# Patient Record
Sex: Female | Born: 1955 | Race: White | Hispanic: No | Marital: Married | Smoking: Never smoker
Health system: Southern US, Community
[De-identification: ages and names within clinical notes are randomized; demographics above are authoritative.]

## PROBLEM LIST (undated history)

## (undated) DIAGNOSIS — Z9889 Other specified postprocedural states: Secondary | ICD-10-CM

## (undated) DIAGNOSIS — E039 Hypothyroidism, unspecified: Secondary | ICD-10-CM

## (undated) DIAGNOSIS — C801 Malignant (primary) neoplasm, unspecified: Secondary | ICD-10-CM

## (undated) DIAGNOSIS — M1990 Unspecified osteoarthritis, unspecified site: Secondary | ICD-10-CM

## (undated) DIAGNOSIS — K56609 Unspecified intestinal obstruction, unspecified as to partial versus complete obstruction: Secondary | ICD-10-CM

## (undated) DIAGNOSIS — N189 Chronic kidney disease, unspecified: Secondary | ICD-10-CM

## (undated) DIAGNOSIS — M810 Age-related osteoporosis without current pathological fracture: Secondary | ICD-10-CM

## (undated) DIAGNOSIS — E785 Hyperlipidemia, unspecified: Secondary | ICD-10-CM

## (undated) DIAGNOSIS — K219 Gastro-esophageal reflux disease without esophagitis: Secondary | ICD-10-CM

## (undated) DIAGNOSIS — R112 Nausea with vomiting, unspecified: Secondary | ICD-10-CM

## (undated) DIAGNOSIS — F329 Major depressive disorder, single episode, unspecified: Secondary | ICD-10-CM

## (undated) DIAGNOSIS — F32A Depression, unspecified: Secondary | ICD-10-CM

## (undated) HISTORY — DX: Age-related osteoporosis without current pathological fracture: M81.0

## (undated) HISTORY — PX: WISDOM TOOTH EXTRACTION: SHX21

## (undated) HISTORY — PX: ABDOMINAL SURGERY: SHX537

## (undated) HISTORY — PX: NEPHRECTOMY: SHX65

## (undated) HISTORY — PX: DILATION AND CURETTAGE OF UTERUS: SHX78

## (undated) HISTORY — PX: BLADDER NECK RECONSTRUCTION: SHX1239

## (undated) HISTORY — PX: BREAST SURGERY: SHX581

## (undated) HISTORY — PX: COLOSTOMY: SHX63

## (undated) HISTORY — PX: OTHER SURGICAL HISTORY: SHX169

---

## 1997-07-24 ENCOUNTER — Ambulatory Visit (HOSPITAL_COMMUNITY): Admission: RE | Admit: 1997-07-24 | Discharge: 1997-07-24 | Payer: Self-pay | Admitting: Obstetrics and Gynecology

## 1998-08-17 ENCOUNTER — Other Ambulatory Visit: Admission: RE | Admit: 1998-08-17 | Discharge: 1998-08-17 | Payer: Self-pay | Admitting: Obstetrics and Gynecology

## 1999-09-30 ENCOUNTER — Other Ambulatory Visit: Admission: RE | Admit: 1999-09-30 | Discharge: 1999-09-30 | Payer: Self-pay | Admitting: Obstetrics and Gynecology

## 1999-10-12 ENCOUNTER — Encounter: Admission: RE | Admit: 1999-10-12 | Discharge: 2000-01-10 | Payer: Self-pay | Admitting: Obstetrics & Gynecology

## 2000-12-22 ENCOUNTER — Other Ambulatory Visit: Admission: RE | Admit: 2000-12-22 | Discharge: 2000-12-22 | Payer: Self-pay | Admitting: Obstetrics and Gynecology

## 2001-01-05 ENCOUNTER — Encounter: Payer: Self-pay | Admitting: Obstetrics and Gynecology

## 2001-01-05 ENCOUNTER — Encounter: Admission: RE | Admit: 2001-01-05 | Discharge: 2001-01-05 | Payer: Self-pay | Admitting: Obstetrics and Gynecology

## 2001-12-31 ENCOUNTER — Other Ambulatory Visit: Admission: RE | Admit: 2001-12-31 | Discharge: 2001-12-31 | Payer: Self-pay | Admitting: Obstetrics and Gynecology

## 2002-10-15 ENCOUNTER — Encounter (INDEPENDENT_AMBULATORY_CARE_PROVIDER_SITE_OTHER): Payer: Self-pay | Admitting: Specialist

## 2002-10-15 ENCOUNTER — Ambulatory Visit (HOSPITAL_BASED_OUTPATIENT_CLINIC_OR_DEPARTMENT_OTHER): Admission: RE | Admit: 2002-10-15 | Discharge: 2002-10-15 | Payer: Self-pay | Admitting: Urology

## 2003-02-11 ENCOUNTER — Other Ambulatory Visit: Admission: RE | Admit: 2003-02-11 | Discharge: 2003-02-11 | Payer: Self-pay | Admitting: Obstetrics and Gynecology

## 2004-03-15 ENCOUNTER — Other Ambulatory Visit: Admission: RE | Admit: 2004-03-15 | Discharge: 2004-03-15 | Payer: Self-pay | Admitting: Obstetrics and Gynecology

## 2005-03-22 ENCOUNTER — Other Ambulatory Visit: Admission: RE | Admit: 2005-03-22 | Discharge: 2005-03-22 | Payer: Self-pay | Admitting: Obstetrics and Gynecology

## 2006-06-13 HISTORY — PX: OTHER SURGICAL HISTORY: SHX169

## 2007-01-25 ENCOUNTER — Ambulatory Visit (HOSPITAL_BASED_OUTPATIENT_CLINIC_OR_DEPARTMENT_OTHER): Admission: RE | Admit: 2007-01-25 | Discharge: 2007-01-25 | Payer: Self-pay | Admitting: Urology

## 2007-01-29 ENCOUNTER — Ambulatory Visit: Payer: Self-pay | Admitting: Surgery

## 2007-05-18 ENCOUNTER — Encounter: Admission: RE | Admit: 2007-05-18 | Discharge: 2007-05-18 | Payer: Self-pay | Admitting: Obstetrics and Gynecology

## 2008-06-02 ENCOUNTER — Ambulatory Visit (HOSPITAL_COMMUNITY): Admission: RE | Admit: 2008-06-02 | Discharge: 2008-06-02 | Payer: Self-pay | Admitting: Internal Medicine

## 2008-07-04 ENCOUNTER — Encounter: Admission: RE | Admit: 2008-07-04 | Discharge: 2008-07-04 | Payer: Self-pay | Admitting: Obstetrics and Gynecology

## 2008-11-07 ENCOUNTER — Encounter: Admission: RE | Admit: 2008-11-07 | Discharge: 2008-11-07 | Payer: Self-pay | Admitting: Obstetrics and Gynecology

## 2009-11-10 ENCOUNTER — Encounter: Admission: RE | Admit: 2009-11-10 | Discharge: 2009-11-10 | Payer: Self-pay | Admitting: Obstetrics and Gynecology

## 2009-11-20 ENCOUNTER — Encounter: Admission: RE | Admit: 2009-11-20 | Discharge: 2009-11-20 | Payer: Self-pay | Admitting: Obstetrics and Gynecology

## 2009-11-24 ENCOUNTER — Encounter: Admission: RE | Admit: 2009-11-24 | Discharge: 2009-11-24 | Payer: Self-pay | Admitting: Obstetrics and Gynecology

## 2009-11-26 ENCOUNTER — Encounter: Admission: RE | Admit: 2009-11-26 | Discharge: 2009-11-26 | Payer: Self-pay | Admitting: Obstetrics and Gynecology

## 2009-11-26 ENCOUNTER — Ambulatory Visit: Payer: Self-pay | Admitting: Oncology

## 2009-12-02 LAB — COMPREHENSIVE METABOLIC PANEL
AST: 35 U/L (ref 0–37)
Albumin: 4.1 g/dL (ref 3.5–5.2)
Calcium: 9.9 mg/dL (ref 8.4–10.5)
Chloride: 102 mEq/L (ref 96–112)
Glucose, Bld: 90 mg/dL (ref 70–99)
Potassium: 4 mEq/L (ref 3.5–5.3)
Sodium: 140 mEq/L (ref 135–145)
Total Bilirubin: 0.9 mg/dL (ref 0.3–1.2)
Total Protein: 7.3 g/dL (ref 6.0–8.3)

## 2009-12-02 LAB — CBC WITH DIFFERENTIAL/PLATELET
BASO%: 0.2 % (ref 0.0–2.0)
Basophils Absolute: 0 10*3/uL (ref 0.0–0.1)
EOS%: 2 % (ref 0.0–7.0)
HCT: 38.9 % (ref 34.8–46.6)
HGB: 13.5 g/dL (ref 11.6–15.9)
MCH: 32.7 pg (ref 25.1–34.0)
MONO%: 6.1 % (ref 0.0–14.0)
NEUT#: 5.1 10*3/uL (ref 1.5–6.5)
Platelets: 302 10*3/uL (ref 145–400)
RBC: 4.14 10*6/uL (ref 3.70–5.45)
WBC: 7.6 10*3/uL (ref 3.9–10.3)

## 2009-12-02 LAB — CANCER ANTIGEN 27.29: CA 27.29: 118 U/mL — ABNORMAL HIGH (ref 0–39)

## 2009-12-15 ENCOUNTER — Encounter (INDEPENDENT_AMBULATORY_CARE_PROVIDER_SITE_OTHER): Payer: Self-pay | Admitting: Surgery

## 2009-12-15 ENCOUNTER — Ambulatory Visit (HOSPITAL_COMMUNITY): Admission: RE | Admit: 2009-12-15 | Discharge: 2009-12-16 | Payer: Self-pay | Admitting: Surgery

## 2009-12-30 ENCOUNTER — Ambulatory Visit (HOSPITAL_COMMUNITY): Admission: RE | Admit: 2009-12-30 | Discharge: 2009-12-30 | Payer: Self-pay | Admitting: Oncology

## 2010-01-04 ENCOUNTER — Ambulatory Visit: Payer: Self-pay | Admitting: Oncology

## 2010-01-22 ENCOUNTER — Encounter (HOSPITAL_COMMUNITY): Admission: RE | Admit: 2010-01-22 | Discharge: 2010-03-11 | Payer: Self-pay | Admitting: Oncology

## 2010-01-28 ENCOUNTER — Encounter
Admission: RE | Admit: 2010-01-28 | Discharge: 2010-03-30 | Payer: Self-pay | Source: Home / Self Care | Admitting: Oncology

## 2010-02-04 ENCOUNTER — Ambulatory Visit: Payer: Self-pay | Admitting: Oncology

## 2010-02-10 LAB — CBC WITH DIFFERENTIAL/PLATELET
Basophils Absolute: 0 10*3/uL (ref 0.0–0.1)
MONO#: 0.4 10*3/uL (ref 0.1–0.9)
NEUT%: 71.2 % (ref 38.4–76.8)
RBC: 4.07 10*6/uL (ref 3.70–5.45)
RDW: 12.3 % (ref 11.2–14.5)
WBC: 6.6 10*3/uL (ref 3.9–10.3)
lymph#: 1.4 10*3/uL (ref 0.9–3.3)

## 2010-02-10 LAB — COMPREHENSIVE METABOLIC PANEL
AST: 23 U/L (ref 0–37)
Alkaline Phosphatase: 50 U/L (ref 39–117)
BUN: 11 mg/dL (ref 6–23)
CO2: 28 mEq/L (ref 19–32)
Creatinine, Ser: 0.98 mg/dL (ref 0.40–1.20)
Glucose, Bld: 86 mg/dL (ref 70–99)
Potassium: 3.9 mEq/L (ref 3.5–5.3)
Sodium: 139 mEq/L (ref 135–145)
Total Bilirubin: 0.7 mg/dL (ref 0.3–1.2)

## 2010-02-18 ENCOUNTER — Ambulatory Visit: Payer: Self-pay | Admitting: Genetic Counselor

## 2010-04-30 ENCOUNTER — Ambulatory Visit: Payer: Self-pay | Admitting: Genetic Counselor

## 2010-05-04 ENCOUNTER — Ambulatory Visit: Payer: Self-pay | Admitting: Oncology

## 2010-05-04 LAB — COMPREHENSIVE METABOLIC PANEL
ALT: 18 U/L (ref 0–35)
Albumin: 4.2 g/dL (ref 3.5–5.2)
Alkaline Phosphatase: 64 U/L (ref 39–117)
Calcium: 9.4 mg/dL (ref 8.4–10.5)
Creatinine, Ser: 0.94 mg/dL (ref 0.40–1.20)
Glucose, Bld: 82 mg/dL (ref 70–99)
Total Bilirubin: 0.5 mg/dL (ref 0.3–1.2)
Total Protein: 6.6 g/dL (ref 6.0–8.3)

## 2010-05-04 LAB — CANCER ANTIGEN 27.29: CA 27.29: 56 U/mL — ABNORMAL HIGH (ref 0–39)

## 2010-05-04 LAB — CBC WITH DIFFERENTIAL/PLATELET
Basophils Absolute: 0 10*3/uL (ref 0.0–0.1)
LYMPH%: 17.7 % (ref 14.0–49.7)
MONO#: 0.4 10*3/uL (ref 0.1–0.9)
MONO%: 5.7 % (ref 0.0–14.0)
Platelets: 233 10*3/uL (ref 145–400)
RDW: 12.4 % (ref 11.2–14.5)
lymph#: 1.1 10*3/uL (ref 0.9–3.3)

## 2010-05-04 LAB — FOLLICLE STIMULATING HORMONE: FSH: 102.4 m[IU]/mL

## 2010-06-21 ENCOUNTER — Ambulatory Visit: Payer: Self-pay | Admitting: Oncology

## 2010-06-24 LAB — CBC WITH DIFFERENTIAL/PLATELET
BASO%: 0.2 % (ref 0.0–2.0)
Basophils Absolute: 0 10*3/uL (ref 0.0–0.1)
EOS%: 0.9 % (ref 0.0–7.0)
Eosinophils Absolute: 0.1 10*3/uL (ref 0.0–0.5)
HCT: 37.8 % (ref 34.8–46.6)
HGB: 13.1 g/dL (ref 11.6–15.9)
LYMPH%: 25 % (ref 14.0–49.7)
MCH: 32.2 pg (ref 25.1–34.0)
MCHC: 34.7 g/dL (ref 31.5–36.0)
MCV: 92.7 fL (ref 79.5–101.0)
MONO#: 0.3 10*3/uL (ref 0.1–0.9)
MONO%: 4.9 % (ref 0.0–14.0)
NEUT#: 4.9 10*3/uL (ref 1.5–6.5)
NEUT%: 69 % (ref 38.4–76.8)
Platelets: 273 10*3/uL (ref 145–400)
RBC: 4.08 10*6/uL (ref 3.70–5.45)
RDW: 12.7 % (ref 11.2–14.5)
WBC: 7.1 10*3/uL (ref 3.9–10.3)
lymph#: 1.8 10*3/uL (ref 0.9–3.3)

## 2010-06-24 LAB — COMPREHENSIVE METABOLIC PANEL
ALT: 19 U/L (ref 0–35)
AST: 23 U/L (ref 0–37)
Albumin: 4.4 g/dL (ref 3.5–5.2)
Alkaline Phosphatase: 57 U/L (ref 39–117)
BUN: 13 mg/dL (ref 6–23)
CO2: 28 mEq/L (ref 19–32)
Calcium: 9.8 mg/dL (ref 8.4–10.5)
Chloride: 104 mEq/L (ref 96–112)
Creatinine, Ser: 0.9 mg/dL (ref 0.40–1.20)
Glucose, Bld: 86 mg/dL (ref 70–99)
Potassium: 4.2 mEq/L (ref 3.5–5.3)
Sodium: 140 mEq/L (ref 135–145)
Total Bilirubin: 0.5 mg/dL (ref 0.3–1.2)
Total Protein: 7 g/dL (ref 6.0–8.3)

## 2010-06-24 LAB — CANCER ANTIGEN 27.29: CA 27.29: 57 U/mL — ABNORMAL HIGH (ref 0–39)

## 2010-07-02 ENCOUNTER — Other Ambulatory Visit: Payer: Self-pay | Admitting: Oncology

## 2010-07-02 DIAGNOSIS — C50919 Malignant neoplasm of unspecified site of unspecified female breast: Secondary | ICD-10-CM

## 2010-07-04 ENCOUNTER — Encounter: Payer: Self-pay | Admitting: Oncology

## 2010-07-04 ENCOUNTER — Encounter: Payer: Self-pay | Admitting: Obstetrics and Gynecology

## 2010-07-30 ENCOUNTER — Encounter (HOSPITAL_COMMUNITY): Payer: Self-pay

## 2010-07-30 ENCOUNTER — Ambulatory Visit (HOSPITAL_COMMUNITY)
Admission: RE | Admit: 2010-07-30 | Discharge: 2010-07-30 | Disposition: A | Discharge status: Home / Self Care | Payer: 59 | Source: Ambulatory Visit | Attending: Oncology | Admitting: Oncology

## 2010-07-30 DIAGNOSIS — Z9010 Acquired absence of unspecified breast and nipple: Secondary | ICD-10-CM | POA: Insufficient documentation

## 2010-07-30 DIAGNOSIS — C7951 Secondary malignant neoplasm of bone: Secondary | ICD-10-CM | POA: Insufficient documentation

## 2010-07-30 DIAGNOSIS — C7952 Secondary malignant neoplasm of bone marrow: Secondary | ICD-10-CM | POA: Insufficient documentation

## 2010-07-30 DIAGNOSIS — C50919 Malignant neoplasm of unspecified site of unspecified female breast: Secondary | ICD-10-CM | POA: Insufficient documentation

## 2010-07-30 DIAGNOSIS — Z09 Encounter for follow-up examination after completed treatment for conditions other than malignant neoplasm: Secondary | ICD-10-CM | POA: Insufficient documentation

## 2010-07-30 DIAGNOSIS — Z905 Acquired absence of kidney: Secondary | ICD-10-CM | POA: Insufficient documentation

## 2010-07-30 HISTORY — DX: Malignant (primary) neoplasm, unspecified: C80.1

## 2010-07-30 MED ORDER — IOHEXOL 300 MG/ML  SOLN
80.0000 mL | Freq: Once | INTRAMUSCULAR | Status: AC | PRN
  Administered 2010-07-30: 80 mL via INTRAVENOUS

## 2010-08-11 ENCOUNTER — Encounter (HOSPITAL_BASED_OUTPATIENT_CLINIC_OR_DEPARTMENT_OTHER): Payer: 59 | Admitting: Oncology

## 2010-08-11 ENCOUNTER — Other Ambulatory Visit: Payer: Self-pay | Admitting: Oncology

## 2010-08-11 DIAGNOSIS — C50319 Malignant neoplasm of lower-inner quadrant of unspecified female breast: Secondary | ICD-10-CM

## 2010-08-11 DIAGNOSIS — M949 Disorder of cartilage, unspecified: Secondary | ICD-10-CM

## 2010-08-11 DIAGNOSIS — C50919 Malignant neoplasm of unspecified site of unspecified female breast: Secondary | ICD-10-CM

## 2010-08-11 DIAGNOSIS — Z170 Estrogen receptor positive status [ER+]: Secondary | ICD-10-CM

## 2010-08-28 LAB — GLUCOSE, CAPILLARY: Glucose-Capillary: 83 mg/dL (ref 70–99)

## 2010-08-29 LAB — DIFFERENTIAL
Basophils Absolute: 0 10*3/uL (ref 0.0–0.1)
Eosinophils Absolute: 0.1 10*3/uL (ref 0.0–0.7)
Eosinophils Relative: 2 % (ref 0–5)
Monocytes Absolute: 0.5 10*3/uL (ref 0.1–1.0)

## 2010-08-29 LAB — BASIC METABOLIC PANEL
BUN: 12 mg/dL (ref 6–23)
Chloride: 105 mEq/L (ref 96–112)
Potassium: 4.2 mEq/L (ref 3.5–5.1)
Sodium: 137 mEq/L (ref 135–145)

## 2010-08-29 LAB — COMPREHENSIVE METABOLIC PANEL
ALT: 28 U/L (ref 0–35)
AST: 25 U/L (ref 0–37)
Alkaline Phosphatase: 62 U/L (ref 39–117)
CO2: 30 mEq/L (ref 19–32)
Chloride: 103 mEq/L (ref 96–112)
GFR calc Af Amer: >60 mL/min (ref >60)
GFR calc non Af Amer: >60 mL/min (ref >60)
Potassium: 3.9 mEq/L (ref 3.5–5.1)
Sodium: 139 mEq/L (ref 135–145)
Total Bilirubin: 0.7 mg/dL (ref 0.3–1.2)

## 2010-08-29 LAB — CBC
HCT: 31.4 % — ABNORMAL LOW (ref 36.0–46.0)
Hemoglobin: 10.8 g/dL — ABNORMAL LOW (ref 12.0–15.0)
Hemoglobin: 13.3 g/dL (ref 12.0–15.0)
MCH: 33.3 pg (ref 26.0–34.0)
MCV: 96.3 fL (ref 78.0–100.0)
RBC: 3.98 MIL/uL (ref 3.87–5.11)
RDW: 12.9 % (ref 11.5–15.5)
WBC: 11.9 10*3/uL — ABNORMAL HIGH (ref 4.0–10.5)
WBC: 8.2 10*3/uL (ref 4.0–10.5)

## 2010-08-29 LAB — LACTATE DEHYDROGENASE: LDH: 142 U/L (ref 94–250)

## 2010-09-07 ENCOUNTER — Other Ambulatory Visit: Payer: Self-pay | Admitting: Surgery

## 2010-09-07 DIAGNOSIS — Z9010 Acquired absence of unspecified breast and nipple: Secondary | ICD-10-CM

## 2010-10-01 ENCOUNTER — Other Ambulatory Visit: Payer: Self-pay | Admitting: Oncology

## 2010-10-01 DIAGNOSIS — C799 Secondary malignant neoplasm of unspecified site: Secondary | ICD-10-CM

## 2010-10-01 DIAGNOSIS — Z853 Personal history of malignant neoplasm of breast: Secondary | ICD-10-CM

## 2010-10-06 ENCOUNTER — Ambulatory Visit (HOSPITAL_COMMUNITY)
Admission: RE | Admit: 2010-10-06 | Discharge: 2010-10-06 | Disposition: A | Discharge status: Home / Self Care | Payer: 59 | Source: Ambulatory Visit | Attending: Oncology | Admitting: Oncology

## 2010-10-06 DIAGNOSIS — Z853 Personal history of malignant neoplasm of breast: Secondary | ICD-10-CM

## 2010-10-06 DIAGNOSIS — R51 Headache: Secondary | ICD-10-CM | POA: Insufficient documentation

## 2010-10-06 DIAGNOSIS — C799 Secondary malignant neoplasm of unspecified site: Secondary | ICD-10-CM

## 2010-10-06 DIAGNOSIS — M47812 Spondylosis without myelopathy or radiculopathy, cervical region: Secondary | ICD-10-CM | POA: Insufficient documentation

## 2010-10-06 DIAGNOSIS — M542 Cervicalgia: Secondary | ICD-10-CM | POA: Insufficient documentation

## 2010-10-06 DIAGNOSIS — H9319 Tinnitus, unspecified ear: Secondary | ICD-10-CM | POA: Insufficient documentation

## 2010-10-06 DIAGNOSIS — C50919 Malignant neoplasm of unspecified site of unspecified female breast: Secondary | ICD-10-CM | POA: Insufficient documentation

## 2010-10-06 DIAGNOSIS — R42 Dizziness and giddiness: Secondary | ICD-10-CM | POA: Insufficient documentation

## 2010-10-06 LAB — CREATININE, SERUM: Creatinine, Ser: 1.04 mg/dL (ref 0.4–1.2)

## 2010-10-06 MED ORDER — GADOBENATE DIMEGLUMINE 529 MG/ML IV SOLN
10.0000 mL | Freq: Once | INTRAVENOUS | Status: AC | PRN
  Administered 2010-10-06: 10 mL via INTRAVENOUS

## 2010-10-07 ENCOUNTER — Other Ambulatory Visit (HOSPITAL_COMMUNITY): Payer: 59

## 2010-10-11 ENCOUNTER — Encounter (HOSPITAL_BASED_OUTPATIENT_CLINIC_OR_DEPARTMENT_OTHER): Payer: 59 | Admitting: Oncology

## 2010-10-11 ENCOUNTER — Other Ambulatory Visit: Payer: Self-pay | Admitting: Oncology

## 2010-10-11 DIAGNOSIS — Z170 Estrogen receptor positive status [ER+]: Secondary | ICD-10-CM

## 2010-10-11 DIAGNOSIS — C50319 Malignant neoplasm of lower-inner quadrant of unspecified female breast: Secondary | ICD-10-CM

## 2010-10-11 DIAGNOSIS — M899 Disorder of bone, unspecified: Secondary | ICD-10-CM

## 2010-10-11 LAB — CANCER ANTIGEN 27.29: CA 27.29: 48 U/mL — ABNORMAL HIGH (ref 0–39)

## 2010-10-14 ENCOUNTER — Other Ambulatory Visit: Payer: Self-pay | Admitting: Neurology

## 2010-10-14 DIAGNOSIS — R0989 Other specified symptoms and signs involving the circulatory and respiratory systems: Secondary | ICD-10-CM

## 2010-10-15 ENCOUNTER — Ambulatory Visit
Admission: RE | Admit: 2010-10-15 | Discharge: 2010-10-15 | Disposition: A | Discharge status: Home / Self Care | Payer: 59 | Source: Ambulatory Visit | Attending: Neurology | Admitting: Neurology

## 2010-10-15 DIAGNOSIS — R0989 Other specified symptoms and signs involving the circulatory and respiratory systems: Secondary | ICD-10-CM

## 2010-10-18 ENCOUNTER — Encounter (HOSPITAL_BASED_OUTPATIENT_CLINIC_OR_DEPARTMENT_OTHER): Payer: 59

## 2010-10-18 DIAGNOSIS — M899 Disorder of bone, unspecified: Secondary | ICD-10-CM

## 2010-10-18 DIAGNOSIS — Z170 Estrogen receptor positive status [ER+]: Secondary | ICD-10-CM

## 2010-10-18 DIAGNOSIS — C50319 Malignant neoplasm of lower-inner quadrant of unspecified female breast: Secondary | ICD-10-CM

## 2010-10-26 NOTE — Procedures (Signed)
DUPLEX DEEP VENOUS EXAM - LOWER EXTREMITY   INDICATION:  Right leg pain.   HISTORY:  Edema:  No  Trauma/Surgery:  Yes  Pain:  Yes  PE:  No  Previous DVT:  No  Anticoagulants:  Other:   DUPLEX EXAM:                CFV   SFV   PopV  PTV    GSV                R  L  R  L  R  L  R   L  R  L  Thrombosis    0  0  0     0     0      0  Spontaneous   +  +  +     +     +      +  Phasic        +  +  +     +     +      +  Augmentation  +  +  +     +     +      +  Compressible  +  +  +     +     +      +  Competent     +  +  +     +     +      +   Legend:  + - yes  o - no  p - partial  D - decreased   IMPRESSION:  No evidence of deep venous thrombosis noted in the right  leg.   _____________________________  Janetta Hora. Fields, MD   MG/MEDQ  D:  01/29/2007  T:  01/30/2007  Job:  [id number]

## 2010-10-26 NOTE — Op Note (Signed)
NAMEKENNIDY, LAMKE                ACCOUNT NO.:  [id number]   MEDICAL RECORD NO.:  [id number]          PATIENT TYPE:  AMB   LOCATION:  [facility]                         FACILITY:  [facility]   PHYSICIAN:  Martina Sinner, MD DATE OF BIRTH:  1956-02-02   DATE OF PROCEDURE:  01/25/2007  DATE OF DISCHARGE:                               OPERATIVE REPORT   PREOPERATIVE DIAGNOSIS:  Stress incontinence.   POSTOPERATIVE DIAGNOSIS:  Stress incontinence.   PROCEDURE PERFORMED:  Sling, cystourethropexy [facility]), plus cystoscopy.   INDICATIONS FOR PROCEDURE:  The patient has mixed stress urge  incontinence, not responding to medical and behavioral therapy.  She has  a single right kidney.  She has had an anti-reflux procedure done on the  right side.   DESCRIPTION OF PROCEDURE:  The patient was prepped and draped in the  usual fashion.  Extra care was taken with leg positioning to minimize  the risk of  compartment syndrome, neuropathy, and deep vein thrombosis.  Preoperative laboratory tests were normal.  Preoperative antibiotics  were given.   Two 1 cm incisions were made 1.5 cm lateral to the midline one  fingerbreadth above the symphysis pubis.  I made a 2 cm incision after  instilling 7 cc of 1% lidocaine with epinephrine, allowing me to raise a  thick pubocervical fascial flap.  I dissected sharply to the  urethrovesical angle bilaterally.   With the bladder emptied, I passed the [facility] needle on the back and then  along the back of the symphysis pubis parallel to the midline, bringing  the needle out through the urethrovesical angle bilaterally onto the  pulp of my left index finger.   I scoped the patient.  There was no entry into the bladder.  There was  no injury to the urethra or bladder.  There was efflux of indigo carmine  from the right ureter.  The ureteral orifices looked basically in normal  position and normal size and shape.  There was no deflection of the  bladder when I  wiggled the trocars.   With the bladder empty I attached the [facility] sling and brought it up  through the retropubic space with the sheath in place.   I positioned the sling over the mid urethra and tensioned it over the  fat part of a medium size Kelly.  I cut below the blue dots and  irrigated the sheath and then removed the sheath.  I was very happy with  the position of the sling.  It laid nicely over the mid urethra.  It was  hypermobile in the midline and lateral to the midline.   The vaginal wall incision was closed with #2-0 Vicryl running suture,  followed by two interrupted sutures.  The skin was closed with #4-0  subcuticular and Dermabond.   Blood loss was less than 30 mL.   The catheter was draining well, draining blue urine at the end of the  case.  Vaginal pack with Estrace cream was inserted.  Catheter was  plugged and she will of a trial of voiding postoperatively.   I am  hoping this procedure will reach her treatment goal.           ______________________________  Martina Sinner, MD  Electronically Signed     SAM/MEDQ  D:  01/25/2007  T:  01/26/2007  Job:  [id number]

## 2010-10-29 NOTE — Op Note (Signed)
   NAME:  Sonia Hill, Sonia Hill                          ACCOUNT NO.:  [id number]   MEDICAL RECORD NO.:  [id number]                   PATIENT TYPE:  AMB   LOCATION:  [facility]                                 FACILITY:  [facility]   PHYSICIAN:  [place] Service, M.D.             DATE OF BIRTH:  1955/12/02   DATE OF PROCEDURE:  DATE OF DISCHARGE:                                 OPERATIVE REPORT   PREOPERATIVE DIAGNOSES:  Stress incontinence, history of left nephrectomy  and right reimplant, recent discovery of pigmented lesion within the  bladder.   POSTOPERATIVE DIAGNOSES:  Stress incontinence, history of left nephrectomy  and right reimplant, recent discovery of pigmented lesion within the  bladder.   ANESTHESIA:  General.   DRAINS:  None.   COMPLICATIONS:  None.   PROCEDURE:  Cystoscopy, cystogram, retrogrades and bladder biopsy.   DESCRIPTION OF PROCEDURE:  The patient was prepped and draped in the dorsal  lithotomy position after institution of an adequate level of general  anesthesia. A 16 French red rubber catheter was inserted, cystogram films  were obtained at 100, 200, 300 and 500 mL, no evidence of reflux. The  bladder was drained, retrograde films were obtained, normal course and  caliber of the ureter, pelvis and calices, no evidence of dilation or other  anatomic deformity. Biopsy forceps were then inserted, photographs were  obtained of pigmented lesion in the right bladder wall, biopsied without  difficulty. Adequate samples were sent to pathology, bleeding sites were  lightly cauterized with a Bugbee electrode. The bladder was drained,  cystoscope was removed, patient was given a B&O suppository and returned to  recovery in satisfactory condition.                                               Boston Service, M.D.    RH/MEDQ  D:  10/15/2002  T:  10/15/2002  Job:  [id number]

## 2010-11-16 ENCOUNTER — Ambulatory Visit: Payer: 59

## 2010-12-24 ENCOUNTER — Ambulatory Visit
Admission: RE | Admit: 2010-12-24 | Discharge: 2010-12-24 | Disposition: A | Discharge status: Home / Self Care | Payer: 59 | Source: Ambulatory Visit | Attending: Surgery | Admitting: Surgery

## 2010-12-24 ENCOUNTER — Other Ambulatory Visit (HOSPITAL_COMMUNITY): Payer: 59

## 2010-12-24 DIAGNOSIS — Z9010 Acquired absence of unspecified breast and nipple: Secondary | ICD-10-CM

## 2010-12-29 ENCOUNTER — Ambulatory Visit (HOSPITAL_COMMUNITY): Admission: RE | Admit: 2010-12-29 | Payer: 59 | Source: Ambulatory Visit | Admitting: Obstetrics and Gynecology

## 2010-12-29 ENCOUNTER — Encounter (HOSPITAL_COMMUNITY): Admission: RE | Payer: Self-pay | Source: Ambulatory Visit

## 2010-12-29 SURGERY — HYSTERECTOMY, VAGINAL, LAPAROSCOPY-ASSISTED
Anesthesia: General

## 2010-12-30 ENCOUNTER — Inpatient Hospital Stay (HOSPITAL_COMMUNITY): Admission: RE | Admit: 2010-12-30 | Payer: 59 | Source: Ambulatory Visit

## 2010-12-30 ENCOUNTER — Other Ambulatory Visit (HOSPITAL_COMMUNITY): Payer: 59

## 2010-12-31 ENCOUNTER — Other Ambulatory Visit: Payer: Self-pay | Admitting: Oncology

## 2010-12-31 ENCOUNTER — Encounter (HOSPITAL_BASED_OUTPATIENT_CLINIC_OR_DEPARTMENT_OTHER): Payer: 59 | Admitting: Oncology

## 2010-12-31 DIAGNOSIS — C50319 Malignant neoplasm of lower-inner quadrant of unspecified female breast: Secondary | ICD-10-CM

## 2010-12-31 DIAGNOSIS — Z170 Estrogen receptor positive status [ER+]: Secondary | ICD-10-CM

## 2010-12-31 DIAGNOSIS — M899 Disorder of bone, unspecified: Secondary | ICD-10-CM

## 2010-12-31 LAB — COMPREHENSIVE METABOLIC PANEL
AST: 45 U/L — ABNORMAL HIGH (ref 0–37)
Albumin: 3.9 g/dL (ref 3.5–5.2)
Alkaline Phosphatase: 56 U/L (ref 39–117)
Glucose, Bld: 82 mg/dL (ref 70–99)
Potassium: 4.1 mEq/L (ref 3.5–5.3)
Sodium: 141 mEq/L (ref 135–145)
Total Bilirubin: 0.7 mg/dL (ref 0.3–1.2)
Total Protein: 6.4 g/dL (ref 6.0–8.3)

## 2010-12-31 LAB — CBC WITH DIFFERENTIAL/PLATELET
BASO%: 0.1 % (ref 0.0–2.0)
Eosinophils Absolute: 0.1 10*3/uL (ref 0.0–0.5)
HCT: 36.8 % (ref 34.8–46.6)
LYMPH%: 17.8 % (ref 14.0–49.7)
MONO#: 0.3 10*3/uL (ref 0.1–0.9)
NEUT#: 5.3 10*3/uL (ref 1.5–6.5)
Platelets: 237 10*3/uL (ref 145–400)
RBC: 4.05 10*6/uL (ref 3.70–5.45)
WBC: 7 10*3/uL (ref 3.9–10.3)
lymph#: 1.2 10*3/uL (ref 0.9–3.3)

## 2011-01-06 ENCOUNTER — Encounter: Payer: 59 | Admitting: Oncology

## 2011-01-06 DIAGNOSIS — R5381 Other malaise: Secondary | ICD-10-CM

## 2011-01-10 ENCOUNTER — Other Ambulatory Visit: Payer: Self-pay | Admitting: Oncology

## 2011-01-11 ENCOUNTER — Other Ambulatory Visit: Payer: Self-pay | Admitting: Oncology

## 2011-01-11 DIAGNOSIS — C50919 Malignant neoplasm of unspecified site of unspecified female breast: Secondary | ICD-10-CM

## 2011-01-13 ENCOUNTER — Other Ambulatory Visit (HOSPITAL_COMMUNITY): Payer: 59

## 2011-01-14 ENCOUNTER — Encounter (HOSPITAL_BASED_OUTPATIENT_CLINIC_OR_DEPARTMENT_OTHER): Payer: 59 | Admitting: Oncology

## 2011-01-14 ENCOUNTER — Ambulatory Visit (HOSPITAL_COMMUNITY)
Admission: RE | Admit: 2011-01-14 | Discharge: 2011-01-14 | Disposition: A | Discharge status: Home / Self Care | Payer: 59 | Source: Ambulatory Visit | Attending: Oncology | Admitting: Oncology

## 2011-01-14 ENCOUNTER — Other Ambulatory Visit: Payer: Self-pay | Admitting: Oncology

## 2011-01-14 DIAGNOSIS — C50919 Malignant neoplasm of unspecified site of unspecified female breast: Secondary | ICD-10-CM

## 2011-01-14 LAB — HEPATIC FUNCTION PANEL
ALT: 53 U/L — ABNORMAL HIGH (ref 0–35)
AST: 43 U/L — ABNORMAL HIGH (ref 0–37)
Albumin: 3.8 g/dL (ref 3.5–5.2)
Bilirubin, Direct: 0.1 mg/dL (ref 0.0–0.3)
Total Bilirubin: 0.7 mg/dL (ref 0.3–1.2)

## 2011-01-14 LAB — FOLLICLE STIMULATING HORMONE: FSH: 46.8 m[IU]/mL

## 2011-01-14 MED ORDER — GADOBENATE DIMEGLUMINE 529 MG/ML IV SOLN
10.0000 mL | Freq: Once | INTRAVENOUS | Status: AC | PRN
  Administered 2011-01-14: 10 mL via INTRAVENOUS

## 2011-01-19 ENCOUNTER — Other Ambulatory Visit: Payer: Self-pay | Admitting: Oncology

## 2011-01-19 DIAGNOSIS — C50919 Malignant neoplasm of unspecified site of unspecified female breast: Secondary | ICD-10-CM

## 2011-01-19 LAB — ESTRADIOL, ULTRA SENS: Estradiol, Ultra Sensitive: 75 pg/mL

## 2011-01-27 ENCOUNTER — Other Ambulatory Visit (HOSPITAL_COMMUNITY): Payer: 59

## 2011-01-27 ENCOUNTER — Ambulatory Visit
Admission: RE | Admit: 2011-01-27 | Discharge: 2011-01-27 | Disposition: A | Discharge status: Home / Self Care | Payer: 59 | Source: Ambulatory Visit | Attending: Oncology | Admitting: Oncology

## 2011-01-27 DIAGNOSIS — C50919 Malignant neoplasm of unspecified site of unspecified female breast: Secondary | ICD-10-CM

## 2011-01-27 MED ORDER — GADOBENATE DIMEGLUMINE 529 MG/ML IV SOLN
10.0000 mL | Freq: Once | INTRAVENOUS | Status: AC | PRN
  Administered 2011-01-27: 10 mL via INTRAVENOUS

## 2011-01-28 ENCOUNTER — Other Ambulatory Visit: Payer: Self-pay | Admitting: Oncology

## 2011-01-28 ENCOUNTER — Encounter (HOSPITAL_BASED_OUTPATIENT_CLINIC_OR_DEPARTMENT_OTHER): Payer: 59 | Admitting: Oncology

## 2011-01-28 DIAGNOSIS — M949 Disorder of cartilage, unspecified: Secondary | ICD-10-CM

## 2011-01-28 DIAGNOSIS — Z170 Estrogen receptor positive status [ER+]: Secondary | ICD-10-CM

## 2011-01-28 DIAGNOSIS — C50319 Malignant neoplasm of lower-inner quadrant of unspecified female breast: Secondary | ICD-10-CM

## 2011-01-28 LAB — COMPREHENSIVE METABOLIC PANEL
Albumin: 4.5 g/dL (ref 3.5–5.2)
Alkaline Phosphatase: 67 U/L (ref 39–117)
BUN: 11 mg/dL (ref 6–23)
CO2: 27 mEq/L (ref 19–32)
Glucose, Bld: 82 mg/dL (ref 70–99)
Total Bilirubin: 0.8 mg/dL (ref 0.3–1.2)

## 2011-01-28 LAB — CBC WITH DIFFERENTIAL/PLATELET
Basophils Absolute: 0 10*3/uL (ref 0.0–0.1)
Eosinophils Absolute: 0.1 10*3/uL (ref 0.0–0.5)
HCT: 39.1 % (ref 34.8–46.6)
HGB: 13.9 g/dL (ref 11.6–15.9)
LYMPH%: 16.8 % (ref 14.0–49.7)
MCV: 92.5 fL (ref 79.5–101.0)
MONO%: 4 % (ref 0.0–14.0)
NEUT#: 5.6 10*3/uL (ref 1.5–6.5)
Platelets: 242 10*3/uL (ref 145–400)

## 2011-01-28 LAB — CANCER ANTIGEN 27.29: CA 27.29: 72 U/mL — ABNORMAL HIGH (ref 0–39)

## 2011-01-31 LAB — CELL SEARCH FOR BREAST CANCER

## 2011-02-02 ENCOUNTER — Encounter (HOSPITAL_BASED_OUTPATIENT_CLINIC_OR_DEPARTMENT_OTHER): Payer: 59 | Admitting: Oncology

## 2011-02-02 ENCOUNTER — Other Ambulatory Visit: Payer: Self-pay | Admitting: Obstetrics and Gynecology

## 2011-02-02 ENCOUNTER — Other Ambulatory Visit: Payer: Self-pay | Admitting: Oncology

## 2011-02-02 DIAGNOSIS — Z170 Estrogen receptor positive status [ER+]: Secondary | ICD-10-CM

## 2011-02-02 DIAGNOSIS — M949 Disorder of cartilage, unspecified: Secondary | ICD-10-CM

## 2011-02-02 DIAGNOSIS — Z853 Personal history of malignant neoplasm of breast: Secondary | ICD-10-CM

## 2011-02-02 DIAGNOSIS — M899 Disorder of bone, unspecified: Secondary | ICD-10-CM

## 2011-02-02 DIAGNOSIS — C50319 Malignant neoplasm of lower-inner quadrant of unspecified female breast: Secondary | ICD-10-CM

## 2011-02-09 ENCOUNTER — Ambulatory Visit
Admission: RE | Admit: 2011-02-09 | Discharge: 2011-02-09 | Disposition: A | Discharge status: Home / Self Care | Payer: 59 | Source: Ambulatory Visit | Attending: Oncology | Admitting: Oncology

## 2011-02-09 DIAGNOSIS — Z853 Personal history of malignant neoplasm of breast: Secondary | ICD-10-CM

## 2011-02-09 MED ORDER — GADOBENATE DIMEGLUMINE 529 MG/ML IV SOLN
10.0000 mL | Freq: Once | INTRAVENOUS | Status: AC | PRN
  Administered 2011-02-09: 10 mL via INTRAVENOUS

## 2011-03-02 ENCOUNTER — Other Ambulatory Visit: Payer: Self-pay | Admitting: Oncology

## 2011-03-02 ENCOUNTER — Encounter (HOSPITAL_BASED_OUTPATIENT_CLINIC_OR_DEPARTMENT_OTHER): Payer: 59 | Admitting: Oncology

## 2011-03-02 DIAGNOSIS — C50319 Malignant neoplasm of lower-inner quadrant of unspecified female breast: Secondary | ICD-10-CM

## 2011-03-02 DIAGNOSIS — M949 Disorder of cartilage, unspecified: Secondary | ICD-10-CM

## 2011-03-02 DIAGNOSIS — Z170 Estrogen receptor positive status [ER+]: Secondary | ICD-10-CM

## 2011-03-02 LAB — CBC WITH DIFFERENTIAL/PLATELET
Basophils Absolute: 0 10*3/uL (ref 0.0–0.1)
HGB: 14.1 g/dL (ref 11.6–15.9)
LYMPH%: 14.7 % (ref 14.0–49.7)
MCH: 32.4 pg (ref 25.1–34.0)
NEUT%: 80.2 % — ABNORMAL HIGH (ref 38.4–76.8)
RBC: 4.37 10*6/uL (ref 3.70–5.45)
RDW: 12.6 % (ref 11.2–14.5)
lymph#: 1.1 10*3/uL (ref 0.9–3.3)

## 2011-03-03 LAB — COMPREHENSIVE METABOLIC PANEL
CO2: 21 mEq/L (ref 19–32)
Creatinine, Ser: 0.92 mg/dL (ref 0.50–1.10)
Glucose, Bld: 91 mg/dL (ref 70–99)
Total Bilirubin: 0.9 mg/dL (ref 0.3–1.2)

## 2011-03-03 LAB — CANCER ANTIGEN 27.29: CA 27.29: 57 U/mL — ABNORMAL HIGH (ref 0–39)

## 2011-03-03 LAB — VITAMIN D 25 HYDROXY (VIT D DEFICIENCY, FRACTURES): Vit D, 25-Hydroxy: 82 ng/mL (ref 30–89)

## 2011-03-03 LAB — LUTEINIZING HORMONE: LH: 105.3 m[IU]/mL — ABNORMAL HIGH

## 2011-03-14 ENCOUNTER — Other Ambulatory Visit: Payer: Self-pay | Admitting: Oncology

## 2011-03-14 DIAGNOSIS — Z853 Personal history of malignant neoplasm of breast: Secondary | ICD-10-CM

## 2011-03-14 LAB — ESTRADIOL, ULTRA SENS: Estradiol, Ultra Sensitive: <2 pg/mL

## 2011-03-16 ENCOUNTER — Other Ambulatory Visit: Payer: Self-pay | Admitting: Oncology

## 2011-03-16 DIAGNOSIS — C50919 Malignant neoplasm of unspecified site of unspecified female breast: Secondary | ICD-10-CM

## 2011-03-17 ENCOUNTER — Other Ambulatory Visit: Payer: Self-pay | Admitting: Oncology

## 2011-03-17 ENCOUNTER — Other Ambulatory Visit: Payer: Self-pay | Admitting: Interventional Radiology

## 2011-03-17 ENCOUNTER — Ambulatory Visit (HOSPITAL_COMMUNITY)
Admission: RE | Admit: 2011-03-17 | Discharge: 2011-03-17 | Disposition: A | Discharge status: Home / Self Care | Payer: 59 | Source: Ambulatory Visit | Attending: Oncology | Admitting: Oncology

## 2011-03-17 ENCOUNTER — Ambulatory Visit (HOSPITAL_COMMUNITY): Payer: 59

## 2011-03-17 DIAGNOSIS — C50919 Malignant neoplasm of unspecified site of unspecified female breast: Secondary | ICD-10-CM

## 2011-03-17 DIAGNOSIS — Z853 Personal history of malignant neoplasm of breast: Secondary | ICD-10-CM

## 2011-03-17 DIAGNOSIS — Z01818 Encounter for other preprocedural examination: Secondary | ICD-10-CM | POA: Insufficient documentation

## 2011-03-17 DIAGNOSIS — Z9010 Acquired absence of unspecified breast and nipple: Secondary | ICD-10-CM | POA: Insufficient documentation

## 2011-03-17 DIAGNOSIS — E039 Hypothyroidism, unspecified: Secondary | ICD-10-CM | POA: Insufficient documentation

## 2011-03-17 LAB — CBC
Platelets: 244 10*3/uL (ref 150–400)
RDW: 12 % (ref 11.5–15.5)
WBC: 5.7 10*3/uL (ref 4.0–10.5)

## 2011-03-17 LAB — APTT: aPTT: 32 seconds (ref 24–37)

## 2011-03-17 LAB — PROTIME-INR: INR: 0.96 (ref 0.00–1.49)

## 2011-03-18 LAB — FERRITIN: Ferritin: 26 ng/mL (ref 10–291)

## 2011-03-18 LAB — VITAMIN D 25 HYDROXY (VIT D DEFICIENCY, FRACTURES): Vit D, 25-Hydroxy: 23 ng/mL — ABNORMAL LOW (ref 30–89)

## 2011-03-28 LAB — URINALYSIS, ROUTINE W REFLEX MICROSCOPIC
Nitrite: NEGATIVE
Protein, ur: NEGATIVE
Specific Gravity, Urine: 1.012
Urobilinogen, UA: 0.2

## 2011-03-28 LAB — URINE MICROSCOPIC-ADD ON

## 2011-03-28 LAB — PROTIME-INR: Prothrombin Time: 12.4

## 2011-03-28 LAB — CBC
HCT: 38.5
Hemoglobin: 13.5
MCV: 88.9
RBC: 4.33
WBC: 8.6

## 2011-03-28 LAB — TYPE AND SCREEN

## 2011-03-28 LAB — ABO/RH: ABO/RH(D): O POS

## 2011-03-31 ENCOUNTER — Other Ambulatory Visit: Payer: Self-pay | Admitting: Oncology

## 2011-03-31 ENCOUNTER — Encounter (HOSPITAL_BASED_OUTPATIENT_CLINIC_OR_DEPARTMENT_OTHER): Payer: 59 | Admitting: Oncology

## 2011-03-31 DIAGNOSIS — Z170 Estrogen receptor positive status [ER+]: Secondary | ICD-10-CM

## 2011-03-31 DIAGNOSIS — C50319 Malignant neoplasm of lower-inner quadrant of unspecified female breast: Secondary | ICD-10-CM

## 2011-03-31 DIAGNOSIS — M899 Disorder of bone, unspecified: Secondary | ICD-10-CM

## 2011-03-31 LAB — COMPREHENSIVE METABOLIC PANEL
AST: 42 U/L — ABNORMAL HIGH (ref 0–37)
Albumin: 4.6 g/dL (ref 3.5–5.2)
Alkaline Phosphatase: 81 U/L (ref 39–117)
BUN: 15 mg/dL (ref 6–23)
Glucose, Bld: 85 mg/dL (ref 70–99)
Potassium: 4.2 mEq/L (ref 3.5–5.3)
Sodium: 140 mEq/L (ref 135–145)
Total Bilirubin: 0.8 mg/dL (ref 0.3–1.2)

## 2011-03-31 LAB — CBC WITH DIFFERENTIAL/PLATELET
Basophils Absolute: 0 10*3/uL (ref 0.0–0.1)
EOS%: 1.3 % (ref 0.0–7.0)
LYMPH%: 20.6 % (ref 14.0–49.7)
MCH: 33 pg (ref 25.1–34.0)
MCV: 93.4 fL (ref 79.5–101.0)
MONO%: 5.7 % (ref 0.0–14.0)
Platelets: 243 10*3/uL (ref 145–400)
RBC: 4.01 10*6/uL (ref 3.70–5.45)
RDW: 12.4 % (ref 11.2–14.5)

## 2011-03-31 LAB — CALCIUM, IONIZED: Calcium, Ion: 1.38 mmol/L — ABNORMAL HIGH (ref 1.12–1.32)

## 2011-04-07 ENCOUNTER — Encounter: Payer: 59 | Admitting: Oncology

## 2011-04-07 DIAGNOSIS — C50919 Malignant neoplasm of unspecified site of unspecified female breast: Secondary | ICD-10-CM

## 2011-04-07 DIAGNOSIS — C7952 Secondary malignant neoplasm of bone marrow: Secondary | ICD-10-CM

## 2011-04-07 DIAGNOSIS — Z9010 Acquired absence of unspecified breast and nipple: Secondary | ICD-10-CM

## 2011-04-12 ENCOUNTER — Telehealth: Payer: Self-pay | Admitting: *Deleted

## 2011-04-19 NOTE — Progress Notes (Signed)
CC:   Duke Salvia. Marcelle Overlie, M.D. Wilmon Arms. Tsuei, M.D. Rene Kocher, M.D.  REASON FOR VISIT:  Sonia Hill returns today with her husband, [name], for followup of her breast cancer.  SUBJECTIVE:  She has had a very turbulent last few months for a variety of reasons as discussed in the previous note here from May, but the good news is that her son, [name], finally did graduate and is planning to go to college in [place] within the next month or so.  She was having significant problems with vaginal drainage.  She tells me Dr. Marcelle Overlie treated her with antibiotics x2 without success, finally did dilatation and curettage in June, but what she did was stop the tamoxifen she had been taking.  She stopped June 5th., stopped a variety of other medications at the same time including the Vagifem, the Amrix, and the Deplin and that problem has subsided.  In fact, overall, she feels "better" off the tamoxifen and would prefer not to return to it.  She does describe herself as mild to moderately fatigued but aside from that reports no other symptoms and a separately scanned detailed review of systems was noncontributory.  PHYSICAL EXAMINATION:  Vital signs:  On physical exam, temperature is 98.5, pulse 79, respiratory rate 20, and blood pressure 136/79.  Her weight is 116 pounds which is up a couple of pounds as compared to February.  HEENT:  Sclerae are not icteric.  Oropharynx is clear.  I do not palpate any cervical, supraclavicular, or axillary adenopathy. Lungs:  No crackles or wheezes.  Heart:  Regular rate and rhythm.  Right breast:  No suspicious findings.  Left breast:  Status post mastectomy. No evidence of local recurrence.  Abdomen:  Benign.  Musculoskeletal exam:  No focal spinal tenderness.  No peripheral edema.  Neurologic exam:  Is nonfocal.  LABORATORY DATA:  Lab work obtained July 20th showed a white cell count of 7.0, hemoglobin 12.5, and platelets 237,000.  CMET was  significant chiefly for a mild elevation in her transaminase levels at 45 and 49. In addition, her CA 27.29 which had been dropping very steadily went up to 67 from 48 on July 20th.  IMAGING DATA:  Films:  Right mammogram July 16th was unchanged. However, she has extremely dense breasts.  IMPRESSION AND PLAN:  Soon to a be 55 year old [place] woman, status post left mastectomy and sentinel lymph node sampling July 2011 for a T1 N1 grade 1 invasive lobular breast cancer, strongly estrogen and progesterone receptor positive, HER-2/neu negative, with a low MIB-1, and multiple sclerotic bone lesions at presentation seen only on CT scan (not on bone scan or PET scan).  Her Oncotype recurrence score was 4, predicting a good response to tamoxifen, which was started in July 2011 and discontinued June 2012 as noted above.  We discussed the fact that her multiple sclerotic bone lesions and her persistent, even if dropping, CA27.29 elevation are consistent with stage IV disease.  We have not biopsied 1 of the bone lesions because to date this would not have changed her treatment, namely we would have treated her with tamoxifen.  She understands that if she does want to find out for 100% sure whether her bone lesions indeed represent metastatic disease as I would expect, a bone marrow biopsy would be necessary and she does not want to proceed to invasive procedures at this time.  She really does not want to go back to tamoxifen.  We talked about other options  and particularly letrozole.  We discussed the possible toxicity side effects and complications and she tells me that she had a bone scan at Dr. Dennie Bible, she thinks last year, and she thinks it was normal. We will try to obtain that record.  I expect that she is postmenopausal at present, but we will obtain an [facility] and LH to show it, and I went ahead and wrote her a prescription for letrozole today.  In any case, she is considering a  TAH-BSO under Dr. Marcelle Overlie at some point.  I am going to set her up for a right breast MRI because I do not think her mammograms are really informative.  I am going to repeat her liver panel to see if there is persistence of the transaminase elevation which, however, could well be due to medication since she is on multiple supplements.  She is going to return to see me in late October to make sure she is tolerating the letrozole without unusual side effects, and she will have repeat lab work before that visit including a repeat CA 27.29 and repeat liver function tests.  She knows to call for any problems that may develop before the next visit.  ADDENDUM:  Teirra had an [facility] and LH drawn on August 3rd.  The [facility] was 46.8, the LH 24.4.  An estradiol level is pending.    ______________________________ Lowella Dell, M.D. GCM/MEDQ  D:  01/15/2011  T:  01/15/2011  Job:  [id number]

## 2011-04-29 ENCOUNTER — Telehealth: Payer: Self-pay | Admitting: *Deleted

## 2011-04-29 NOTE — Telephone Encounter (Signed)
Pt left message stating she wanted to obtain further labs for continued fatique.  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  This rn will review with md.

## 2011-05-16 ENCOUNTER — Telehealth: Payer: Self-pay | Admitting: Oncology

## 2011-05-16 NOTE — Telephone Encounter (Signed)
pt called and confirmed appt for 12/04 and 12/18

## 2011-05-17 ENCOUNTER — Other Ambulatory Visit: Payer: Self-pay | Admitting: Oncology

## 2011-05-17 ENCOUNTER — Other Ambulatory Visit (HOSPITAL_BASED_OUTPATIENT_CLINIC_OR_DEPARTMENT_OTHER): Payer: 59 | Admitting: Lab

## 2011-05-17 DIAGNOSIS — Z170 Estrogen receptor positive status [ER+]: Secondary | ICD-10-CM

## 2011-05-17 DIAGNOSIS — M899 Disorder of bone, unspecified: Secondary | ICD-10-CM

## 2011-05-17 DIAGNOSIS — C50319 Malignant neoplasm of lower-inner quadrant of unspecified female breast: Secondary | ICD-10-CM

## 2011-05-17 LAB — CBC WITH DIFFERENTIAL/PLATELET
Basophils Absolute: 0 10*3/uL (ref 0.0–0.1)
Eosinophils Absolute: 0.1 10*3/uL (ref 0.0–0.5)
HCT: 40.2 % (ref 34.8–46.6)
HGB: 13.9 g/dL (ref 11.6–15.9)
LYMPH%: 21.7 % (ref 14.0–49.7)
MCHC: 34.6 g/dL (ref 31.5–36.0)
MONO#: 0.3 10*3/uL (ref 0.1–0.9)
NEUT%: 70.8 % (ref 38.4–76.8)
Platelets: 261 10*3/uL (ref 145–400)
WBC: 6.2 10*3/uL (ref 3.9–10.3)
lymph#: 1.3 10*3/uL (ref 0.9–3.3)

## 2011-05-17 LAB — TSH: TSH: 0.566 u[IU]/mL (ref 0.350–4.500)

## 2011-05-17 LAB — CANCER ANTIGEN 27.29: CA 27.29: 28 U/mL (ref 0–39)

## 2011-05-17 LAB — COMPREHENSIVE METABOLIC PANEL
ALT: 82 U/L — ABNORMAL HIGH (ref 0–35)
BUN: 11 mg/dL (ref 6–23)
CO2: 30 mEq/L (ref 19–32)
Calcium: 10.8 mg/dL — ABNORMAL HIGH (ref 8.4–10.5)
Chloride: 101 mEq/L (ref 96–112)
Creatinine, Ser: 0.84 mg/dL (ref 0.50–1.10)
Glucose, Bld: 92 mg/dL (ref 70–99)
Total Bilirubin: 0.4 mg/dL (ref 0.3–1.2)

## 2011-05-17 LAB — FOLLICLE STIMULATING HORMONE: FSH: 195.1 m[IU]/mL — ABNORMAL HIGH

## 2011-05-25 LAB — ESTRADIOL, ULTRA SENS: Estradiol, Ultra Sensitive: 8 pg/mL

## 2011-05-31 ENCOUNTER — Ambulatory Visit (HOSPITAL_BASED_OUTPATIENT_CLINIC_OR_DEPARTMENT_OTHER): Payer: 59 | Admitting: Oncology

## 2011-05-31 DIAGNOSIS — E559 Vitamin D deficiency, unspecified: Secondary | ICD-10-CM

## 2011-05-31 DIAGNOSIS — C50919 Malignant neoplasm of unspecified site of unspecified female breast: Secondary | ICD-10-CM

## 2011-05-31 DIAGNOSIS — Z171 Estrogen receptor negative status [ER-]: Secondary | ICD-10-CM

## 2011-05-31 NOTE — Progress Notes (Signed)
ID: Clint Guy   Interval History:   Interval history is generally unremarkable. Bonita Quin continues to have some fatigue, mostly from lack of sleep. She and her husband are on a strict vegan diet except for [facility] which they eat on Fridays. She has snacks mid-morning and mid afternoon. She walks at least 3 or 4 times a week with her dogs.  ROS: She seems to be tolerating the letrozole with minimal side effects. She has fewer hot flashes. They don't wake her up. She is very concerned about depression and mood changes but these do not seem to be developing. She has not developed the arthralgias/myalgias that some patients can experience on this medication. She does not complain of vaginal dryness. She does bruise easily. Otherwise a detailed review of systems was unremarkable.   Medications: I have reviewed the patient's current medications.  Current Outpatient Prescriptions  Medication Sig Dispense Refill  . ARMOUR THYROID 30 MG tablet       . ibuprofen (ADVIL,MOTRIN) 600 MG tablet       . letrozole (FEMARA) 2.5 MG tablet       . oxyCODONE (OXY IR/ROXICODONE) 5 MG immediate release tablet        PAST MEDICAL HISTORY:   Significant for: 1.  Osteopenia. 2. Congenital left renal atrophy.  3. History of recurrent UTIs. 4. History of bladder reconstruction for reflux more than 18 years ago. 5. History of depression and anxiety. 6. History of hypothyroidism.  7. History of "sling procedure". 8. 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    FAMILY HISTORY: The patient's father is alive at age 56.  He has Alzheimer's disease. The patient's mother was diagnosed with breast cancer at the age of 56.  She died at the age of 56.  The patient's father had one out of two sisters with breast cancer.  There are other breast cancers on the mother's side.  The patient has one brother and one younger sister, neither with cancer.    GYN  HISTORY:  She is GX P1.  That pregnancy was at age 55.  Menarche was at age 55. She was premenopausal at the time of diagnosis.    SOCIAL HISTORY:  She works as a Doctor, general practice, part-time and on Administrator, sports.  Her husband, [name], is a Scientist, physiological at I believe [employer].  [name] works as a Runner, broadcasting/film/video in [place].  The patient's son [name] lives with them and is currently in school.  The patient attends [employer].   Objective:  Filed Vitals:   05/31/11 1706  BP: 121/77  Pulse: 81  Temp: 98.5 F (36.9 C)   There is no height on file to calculate BMI.  Physical Exam:   Sclerae unicteric  Oropharynx clear  No peripheral adenopathy  Lungs clear -- no rales or rhonchi  Heart regular rate and rhythm  Abdomen benign  MSK no focal spinal tenderness, no peripheral edema  Neuro nonfocal  Breast exam: Right breast no suspicious masses; left breast status post mastectomy, with no evidence of local recurrence.  Lab Results: Her CA 27-29 has normalized after 4 months on letrozole  CMP    Chemistry      Component Value Date/Time   NA 140 05/17/2011 1606   K 3.9 05/17/2011 1606   CL 101 05/17/2011 1606   CO2 30 05/17/2011 1606   BUN 11 05/17/2011 1606   CREATININE 0.84 05/17/2011 1606  Component Value Date/Time   CALCIUM 10.8* 05/17/2011 1606   ALKPHOS 99 05/17/2011 1606   AST 56* 05/17/2011 1606   ALT 82* 05/17/2011 1606   BILITOT 0.4 05/17/2011 1606       CBC Lab Results  Component Value Date   WBC 6.2 05/17/2011   HGB 13.9 05/17/2011   HCT 40.2 05/17/2011   MCV 93.2 05/17/2011   PLT 261 05/17/2011   NEUTROABS 4.4 05/17/2011    Studies/Results:  No results found.  Assessment: A 55 year old [facility] BRCA 1-2 negative woman status post left mastectomy and sentinel lymph node sampling in July 2011 for a T1 N1 M1, Stage IV  nvasive lobular breast cancer, grade 1, strongly estrogen and progesterone receptor-positive, HER2 negative with MIB-1 of 9%, and  multiple sclerotic bone lesions at presentation seen only on CT scan (not on bone scan or PET scan), with biopsy-proven metastatic disease to bone documented OCT 2012; with an elevated CA 27.29 at presentation, and an Oncotype recurrence score of 4, predicting a good response to antiestrogens.  On letrozole since AUG 2012 with good tolerance and evidence of response.  Plan: I am delighted that Bonita Quin is doing so well, and I gave her a graph of her CA 27-29 results. She is concerned that she may yet develop side effects from the letrozole, but I reassured her that this would be unusual at this point. Certainly we have to watch the bones. She brought me a copy of her most [facility]. The lowest reading at the left femoral neck was -1.8 on the T score. She remains afraid of the possible complications and side effects of bisphosphonates. I have encouraged her to walk on a daily basis and of course she takes calcium/magnesium/vitamin D supplementation regularly.  Dr. Janene Harvey in [place] has suggested she consider a "mild hyperbaric treatment" for her bones. We discussed this option for the better part of an hour today. She understands that I am not aware of any persuasive data at that this would be helpful for her bones. We also discussed concerns regarding the development of free radicals and oxygen toxicity. Finally it is unlikely that this kind of treatment would be paid for by her insurance. She would like me to explore this possibility, and I will contact our hyperbaric team on her behalf. I also think that she would benefit from participating in our survivorship clinic, and I will place that referral for her as well.  She will see me again in February. I will check a PTH as well as her repeat LH and FSH prior to that visit. She knows to call for any problems that may develop before then.  MAGRINAT,GUSTAV C 05/31/2011

## 2011-06-01 ENCOUNTER — Telehealth: Payer: Self-pay | Admitting: *Deleted

## 2011-06-01 NOTE — Telephone Encounter (Signed)
gave patient appointment for 08-01-2011 lab only will see dr.magrinat in 08-2011

## 2011-06-30 ENCOUNTER — Other Ambulatory Visit: Payer: Self-pay | Admitting: *Deleted

## 2011-06-30 DIAGNOSIS — C50919 Malignant neoplasm of unspecified site of unspecified female breast: Secondary | ICD-10-CM

## 2011-06-30 MED ORDER — LETROZOLE 2.5 MG PO TABS: 2.5000 mg | ORAL_TABLET | Freq: Every day | ORAL | Status: DC

## 2011-07-22 ENCOUNTER — Other Ambulatory Visit: Payer: Self-pay | Admitting: Otolaryngology

## 2011-07-25 ENCOUNTER — Ambulatory Visit
Admission: RE | Admit: 2011-07-25 | Discharge: 2011-07-25 | Disposition: A | Discharge status: Home / Self Care | Payer: 59 | Source: Ambulatory Visit | Attending: Otolaryngology | Admitting: Otolaryngology

## 2011-08-01 ENCOUNTER — Other Ambulatory Visit (HOSPITAL_BASED_OUTPATIENT_CLINIC_OR_DEPARTMENT_OTHER): Payer: 59 | Admitting: Lab

## 2011-08-01 DIAGNOSIS — C50919 Malignant neoplasm of unspecified site of unspecified female breast: Secondary | ICD-10-CM

## 2011-08-01 DIAGNOSIS — E559 Vitamin D deficiency, unspecified: Secondary | ICD-10-CM

## 2011-08-01 LAB — CBC WITH DIFFERENTIAL/PLATELET
BASO%: 0.4 % (ref 0.0–2.0)
Basophils Absolute: 0 10*3/uL (ref 0.0–0.1)
EOS%: 1.1 % (ref 0.0–7.0)
HGB: 14.4 g/dL (ref 11.6–15.9)
MCH: 32.1 pg (ref 25.1–34.0)
MCHC: 34.7 g/dL (ref 31.5–36.0)
MCV: 92.6 fL (ref 79.5–101.0)
MONO%: 4.5 % (ref 0.0–14.0)
NEUT%: 79.3 % — ABNORMAL HIGH (ref 38.4–76.8)
RDW: 12.5 % (ref 11.2–14.5)

## 2011-08-02 LAB — VITAMIN D 25 HYDROXY (VIT D DEFICIENCY, FRACTURES): Vit D, 25-Hydroxy: 89 ng/mL (ref 30–89)

## 2011-08-02 LAB — COMPREHENSIVE METABOLIC PANEL
ALT: 31 U/L (ref 0–35)
AST: 29 U/L (ref 0–37)
Alkaline Phosphatase: 93 U/L (ref 39–117)
BUN: 13 mg/dL (ref 6–23)
Creatinine, Ser: 0.9 mg/dL (ref 0.50–1.10)
Potassium: 4.2 mEq/L (ref 3.5–5.3)

## 2011-08-09 LAB — ESTRADIOL, ULTRA SENS: Estradiol, Ultra Sensitive: <2 pg/mL

## 2011-08-23 ENCOUNTER — Ambulatory Visit (HOSPITAL_BASED_OUTPATIENT_CLINIC_OR_DEPARTMENT_OTHER): Payer: 59 | Admitting: Oncology

## 2011-08-23 VITALS — BP 114/77 | HR 71 | Temp 98.6°F | Ht 62.0 in | Wt 115.1 lb

## 2011-08-23 DIAGNOSIS — Z905 Acquired absence of kidney: Secondary | ICD-10-CM

## 2011-08-23 DIAGNOSIS — C50919 Malignant neoplasm of unspecified site of unspecified female breast: Secondary | ICD-10-CM

## 2011-08-23 NOTE — Progress Notes (Signed)
ID: Sonia Hill   DOB: 09/19/1955  MR#: [id number]  UXL#:[id number]  HISTORY OF PRESENT ILLNESS: Sonia Hill had screening mammography on 11/10/2009 showing very dense tissue with a possible distortion in the left breast.  Digital mammography on June 10th showed a 1.5 cm cluster of microcalcifications in the lower inner portion of the breast.  On physical exam, there was no palpable mass and no palpable axillary adenopathy.  There was some nipple inversion inferolaterally.  Ultrasound showed a 2.6 cm irregular mass-like area with dense posterior acoustic shadowing in the 3 o'clock position, 1 cm from the nipple, but normal appearing left axillary lymph nodes.    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  A second mass noted on ultrasound was also lobular, also low grade.  Both were ER and PR positive at well over 90%.  Both had low proliferation markers at 9%, and both were Her-2 negative by CISH with ratios of 1.6 and 1.24.  In short, both masses were nearly identical.    On June 16th, the patient had bilateral breast MRIs which showed in the left breast at 3 o'clock an irregular area of abnormal enhancement measuring up to 2.7 cm and in the left lower inner quadrant, a second post-biopsy area measuring 1.5 cm.  The difference between these two areas was stated at the conference the morning of the visit to be approximately 5 cm.  Given this data the patient opted for Left mastectomy with sentinel lymph node sampling, performed July 2011 with results as detailed below.  INTERVAL HISTORY: Sonia Hill returns with her husband Sonia Hill for followup of her metastatic breast cancer. Since her last visit here she has established herself with the [facility] in [place] and has started methyl folate and some anti-inflammatories. She had some changes made in her thyroid dose  and is planning a chelation treatment.   REVIEW OF SYSTEMS: She is doing "fine", considerably improved as compared to her last visit here. She has more energy, and her brain is functioning better, she feels. 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 This is worse in the morning, better with activity. The problems she had in her heels has resolved and her hot flashes are gone. She still has some vaginal dryness issues and does not find astroglideTo be of much help. She has not had the arthralgias/myalgias that some patients can get with aromatase inhibitors. A detailed review of systems was otherwise noncontributory  PAST MEDICAL HISTORY: Past Medical History  Diagnosis Date  . Cancer     lt. breast ca  1. Osteopenia. 2. Congenital left renal atrophy.  3. History of recurrent UTIs. 4. History of bladder reconstruction for reflux more than 18 years ago. 5. History of depression and anxiety. 6. History of hypothyroidism.  7. History of "sling procedure".  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    FAMILY HISTORY The patient's father is alive in his early 71s.  He has Alzheimer's disease. The patient's mother was diagnosed with breast cancer at the age of 13.  She died at the age of 50.  The patient's father had one out of two sisters with breast cancer.  There are other breast cancers on the mother's side.  The patient  has one brother and one younger sister, neither with cancer.    GYNECOLOGIC HISTORY: She is GX P1.  That pregnancy was at age 28.  Menarche was at age 22.  She was perimenopausal at the time of diagnosis but is now convincingly postmenopausal  SOCIAL HISTORY: She works as a Doctor, general practice, part-time and on Administrator, sports.  Her husband, Sonia Hill, is a Scientist, physiological at [employer].  Sister Sonia Hill works as a Runner, broadcasting/film/video in [place].  The patient's  son Sonia Hill is a Printmaker in college.  The patient attends [employer].   ADVANCED DIRECTIVES:  HEALTH MAINTENANCE: History  Substance Use Topics  . Smoking status: Not on file  . Smokeless tobacco: Not on file  . Alcohol Use: Not on file     Colonoscopy:  PAP: UTD  Bone density: Sonia Hill 2012 = osteopenia  Lipid panel: 08/12/2011 total 204, HDL 46, LDL 136  Allergies  Allergen Reactions  . Iohexol      Code: HIVES, Desc: pt called 1 day after ct 12-31-09--stated itching, runny nose, red nose---pt taking 50mg  benadryl---pt instructed to call if symptoms change or worsen. Also instructed to do 13hr prep for future contrast studies, Onset Date: [id number]     Current Outpatient Prescriptions  Medication Sig Dispense Refill  . ARMOUR THYROID 30 MG tablet       . cholecalciferol (VITAMIN D) 1000 UNITS tablet Take 5,000 Units by mouth daily.      . folic acid (FOLVITE) 1 MG tablet Take 30 mg by mouth daily.      Marland Kitchen ibuprofen (ADVIL,MOTRIN) 600 MG tablet       . letrozole (FEMARA) 2.5 MG tablet Take 1 tablet (2.5 mg total) by mouth daily.  90 tablet  3  . MULTIPLE VITAMIN PO Take 1 tablet by mouth.      . NON FORMULARY Take 250 mg by mouth 2 (two) times a week. DMSA      . NON FORMULARY Take 2,000 mg by mouth 2 (two) times a week. EDTA TAKES ON OFF DAYS OF DMSA        OBJECTIVE: Middle aged white woman in no acute distress Filed Vitals:   08/23/11 1650  BP: 114/77  Pulse: 71  Temp: 98.6 F (37 C)     Body mass index is 21.05 kg/(m^2).    ECOG FS: 0  Sclerae unicteric Oropharynx clear No peripheral adenopathy Lungs no rales or rhonchi Heart regular rate and rhythm Abd benign MSK no focal spinal tenderness, no peripheral edema Neuro: nonfocal Breasts: Right breast is unremarkable. The left breast is status post mastectomy. There is no evidence of local recurrence  LAB RESULTS:  LH 65.7, FSH 180.6, estradiol <2  Lab Results  Component Value Date   WBC 7.2  08/01/2011   NEUTROABS 5.7 08/01/2011   HGB 14.4 08/01/2011   HCT 41.4 08/01/2011   MCV 92.6 08/01/2011   PLT 267 08/01/2011      Chemistry      Component Value Date/Time   NA 136 08/01/2011 1415   K 4.2 08/01/2011 1415   CL 99 08/01/2011 1415   CO2 26 08/01/2011 1415   BUN 13 08/01/2011 1415   CREATININE 0.90 08/01/2011 1415      Component Value Date/Time   CALCIUM 10.1 08/01/2011 1415   CALCIUM 10.1 08/01/2011 1415   ALKPHOS 93 08/01/2011 1415   AST 29 08/01/2011 1415   ALT 31 08/01/2011 1415   BILITOT 0.7 08/01/2011 1415  Lab Results  Component Value Date   LABCA2 44* 08/01/2011    No components found with this basename: [id number]    No results found for this basename: INR:1;PROTIME:1 in the last 168 hours  Urinalysis    Component Value Date/Time   COLORURINE YELLOW 01/25/2007 0748   APPEARANCEUR CLEAR 01/25/2007 0748   LABSPEC 1.012 01/25/2007 0748   PHURINE 6.0 01/25/2007 0748   GLUCOSEU NEGATIVE 01/25/2007 0748   HGBUR MODERATE* 01/25/2007 0748   BILIRUBINUR NEGATIVE 01/25/2007 0748   KETONESUR NEGATIVE 01/25/2007 0748   PROTEINUR NEGATIVE 01/25/2007 0748   UROBILINOGEN 0.2 01/25/2007 0748   NITRITE NEGATIVE 01/25/2007 0748   LEUKOCYTESUR SMALL* 01/25/2007 0748    STUDIES: Ct Temporal Bones W/o Cm  07/25/2011  *RADIOLOGY REPORT*  Clinical Data: Evaluate for mastoiditis.  Sensorineural hearing loss for 6 weeks. Bilateral ear pressure.  Left breast cancer with positive bone marrow aspirate.  CT TEMPORAL BONES WITHOUT CONTRAST  Technique:  Axial and coronal plane CT imaging of the petrous temporal bones was performed with thin-collimation image reconstruction.  No intravenous contrast was administered. Multiplanar CT image reconstructions were also generated.  Comparison: MRI head 10/06/2010.  Findings: Normal external canals.  Middle ear structures are normal bilaterally.  No visible inner ear pathology.  Minimal dependent fluid left mastoid without coalescence or bony destruction.   Visualized intracranial compartment unremarkable.  Carcinomatous meningitis not excluded on noncontrast CT.  If there is concern of such causing cranial nerve dysfunction, MRI of the brain without and with contrast should be repeated.  IMPRESSION: Minimal left dependent mastoid fluid, otherwise negative.  Original Report Authenticated By: Elsie Stain, M.D.    ASSESSMENT: A 56 year old BRCA 1-2 negative [facility] woman  (1) status post left mastectomy and sentinel lymph node sampling in July 2011 for a T1 N1 M1, stage IV invasive lobular breast cancer, grade 1, strongly estrogen and progesterone receptor-positive, HER2 negative with MIB-1 of 9% and no HER2 amplification with multiple sclerotic bone lesions at presentation seen only on CT scan (not on bone scan or PET scan), but now with biopsy-proven metastatic disease to bone; with an elevated CA 27.29 at presentation, and an Oncotype recurrence score of 4, predicting a good response to antiestrogens. She received  a) tamoxifen with evidence of response but poor tolerance  b) letrozole starting August 2012  (2) single functioning kidney  PLAN: I think overall she is tolerating well the letrozole and the plan is to continue that for 5 years. My other suggestion to her was that she consider a bisphosphonate. However she is very concerned about the oral formulations because of the problem with reflux, and worried about the intravenous once because she only has one kidney. I think denosumab may be an option for her, and I have discussed the possible side effects toxicities and complications of this medication with her. I would suggest a dose every 6 months and then repeat her bone density in January of 2014. Disc under read up on this and and tell me what she thinks.  Otherwise she will return to see me in 3 months of we will obtain lab work and a physical exam before that visit. She knows to call for any problems that may develop before  then   Winnifred Dufford C    08/23/2011

## 2011-10-25 ENCOUNTER — Other Ambulatory Visit: Payer: Self-pay | Admitting: *Deleted

## 2011-11-03 ENCOUNTER — Telehealth: Payer: Self-pay | Admitting: Oncology

## 2011-11-03 NOTE — Telephone Encounter (Signed)
S/w the pt and she is aware of her June appts °

## 2011-11-14 ENCOUNTER — Other Ambulatory Visit (HOSPITAL_BASED_OUTPATIENT_CLINIC_OR_DEPARTMENT_OTHER): Payer: 59 | Admitting: Lab

## 2011-11-14 DIAGNOSIS — C50919 Malignant neoplasm of unspecified site of unspecified female breast: Secondary | ICD-10-CM

## 2011-11-14 LAB — COMPREHENSIVE METABOLIC PANEL
ALT: 43 U/L — ABNORMAL HIGH (ref 0–35)
Alkaline Phosphatase: 88 U/L (ref 39–117)
Glucose, Bld: 84 mg/dL (ref 70–99)
Sodium: 140 mEq/L (ref 135–145)
Total Bilirubin: 0.7 mg/dL (ref 0.3–1.2)
Total Protein: 6.9 g/dL (ref 6.0–8.3)

## 2011-11-14 LAB — CANCER ANTIGEN 27.29: CA 27.29: 29 U/mL (ref 0–39)

## 2011-11-14 LAB — CBC WITH DIFFERENTIAL/PLATELET
Eosinophils Absolute: 0.1 10*3/uL (ref 0.0–0.5)
LYMPH%: 13.7 % — ABNORMAL LOW (ref 14.0–49.7)
MCV: 92 fL (ref 79.5–101.0)
MONO%: 4.4 % (ref 0.0–14.0)
NEUT#: 7.2 10*3/uL — ABNORMAL HIGH (ref 1.5–6.5)
Platelets: 261 10*3/uL (ref 145–400)
RBC: 4.26 10*6/uL (ref 3.70–5.45)
nRBC: 0 % (ref 0–0)

## 2011-11-15 ENCOUNTER — Other Ambulatory Visit: Payer: 59 | Admitting: Lab

## 2011-11-22 ENCOUNTER — Ambulatory Visit (HOSPITAL_BASED_OUTPATIENT_CLINIC_OR_DEPARTMENT_OTHER): Payer: 59 | Admitting: Oncology

## 2011-11-22 ENCOUNTER — Telehealth: Payer: Self-pay | Admitting: *Deleted

## 2011-11-22 VITALS — BP 128/82 | HR 77 | Temp 98.5°F | Ht 62.0 in | Wt 115.6 lb

## 2011-11-22 DIAGNOSIS — Z170 Estrogen receptor positive status [ER+]: Secondary | ICD-10-CM

## 2011-11-22 DIAGNOSIS — C50319 Malignant neoplasm of lower-inner quadrant of unspecified female breast: Secondary | ICD-10-CM

## 2011-11-22 DIAGNOSIS — M19049 Primary osteoarthritis, unspecified hand: Secondary | ICD-10-CM

## 2011-11-22 DIAGNOSIS — C7951 Secondary malignant neoplasm of bone: Secondary | ICD-10-CM

## 2011-11-22 DIAGNOSIS — C50919 Malignant neoplasm of unspecified site of unspecified female breast: Secondary | ICD-10-CM

## 2011-11-22 LAB — ESTRADIOL, ULTRA SENS: Estradiol, Ultra Sensitive: <2 pg/mL

## 2011-11-22 NOTE — Progress Notes (Signed)
ID: [name]   DOB: [birth year]  MR#: [id number]  UUV#:[id number]  HISTORY OF PRESENT ILLNESS: Sonia Hill had screening mammography on 11/10/2009 showing very dense tissue with a possible distortion in the left breast.  Digital mammography on June 10th showed a 1.5 cm cluster of microcalcifications in the lower inner portion of the breast.  On physical exam, there was no palpable mass and no palpable axillary adenopathy.  There was some nipple inversion inferolaterally.  Ultrasound showed a 2.6 cm irregular mass-like area with dense posterior acoustic shadowing in the 3 o'clock position, 1 cm from the nipple, but normal appearing left axillary lymph nodes.    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  A second mass noted on ultrasound was also lobular, also low grade.  Both were ER and PR positive at well over 90%.  Both had low proliferation markers at 9%, and both were Her-2 negative by CISH with ratios of 1.6 and 1.24.  In short, both masses were nearly identical.    On June 16th, the patient had bilateral breast MRIs which showed in the left breast at 3 o'clock an irregular area of abnormal enhancement measuring up to 2.7 cm and in the left lower inner quadrant, a second post-biopsy area measuring 1.5 cm.  The difference between these two areas was stated at the conference the morning of the visit to be approximately 5 cm.  Given this data the patient opted for Left mastectomy with sentinel lymph node sampling, performed July 2011 with results as detailed below.  INTERVAL HISTORY: Sonia Hill returns with her husband Sonia Hill for followup of her metastatic breast cancer. She  Is tolerating the letrozole well overall. She continues to research the bisphosphonates and denosumab but does not yet feel comfortable receiving that. She understands there is a survival  advantage that to her a 3% survival advantage does not seem like a great motivator.   REVIEW OF SYSTEMS: Her skin and hair are little bit dryer, which is very likely to be due to the letrozole. On the other hand she is having some prognathia symptoms which are not going to be related. These are not accompanied by TMJ or any gum or teeth problems. She is going to be discussing this with her dentist. She has a salt taste in her mouth which she thinks is likely due to a heavy metal chelator medicine she is receiving. She has stiffness and discomfort in her right thumb joints and she wonders if she should see a hand specialist for this. She has some "gel phenomenon" in the mornings or when she sits for a long time, but this improves with activity, and it does help her exercise more frequently. She feels forgetful but not depressed. She feels mildly fatigued. Otherwise a detailed review of systems was stable  PAST MEDICAL HISTORY: Past Medical History  Diagnosis Date  . Cancer     lt. breast ca  1. Osteopenia. 2. Congenital left renal atrophy.  3. History of recurrent UTIs. 4. History of bladder reconstruction for reflux more than 18 years ago. 5. History of depression and anxiety. 6. History of hypothyroidism.  7. History of "sling procedure".  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    FAMILY HISTORY The patient's father is alive in his early 43s.  He has  Alzheimer's disease. The patient's mother was diagnosed with breast cancer at the age of 18.  She died at the age of 50.  The patient's father had one out of two sisters with breast cancer.  There are other breast cancers on the mother's side.  The patient has one brother and one younger sister, neither with cancer.    GYNECOLOGIC HISTORY: She is GX P1.  That pregnancy was at age 51.  Menarche was at age 34.  She was perimenopausal at the time of diagnosis but is now convincingly  postmenopausal  SOCIAL HISTORY: She works as a Doctor, general practice, part-time and on Administrator, sports.  Her husband, Sonia Hill, is a Scientist, physiological at [employer].  Sister Sonia Hill works as a Runner, broadcasting/film/video in [place].  The patient's son Sonia Hill is a Printmaker in college.  The patient attends [employer].   ADVANCED DIRECTIVES: in place  HEALTH MAINTENANCE: History  Substance Use Topics  . Smoking status: Not on file  . Smokeless tobacco: Not on file  . Alcohol Use: Not on file     Colonoscopy:  PAP: UTD  Bone density: Sonia Hill 2012 = osteopenia  Lipid panel: 08/12/2011 total 204, HDL 46, LDL 136  Allergies  Allergen Reactions  . Iohexol      Code: HIVES, Desc: pt called 1 day after ct 12-31-09--stated itching, runny nose, red nose---pt taking 50mg  benadryl---pt instructed to call if symptoms change or worsen. Also instructed to do 13hr prep for future contrast studies, Onset Date: [id number]     Current Outpatient Prescriptions  Medication Sig Dispense Refill  . ARMOUR THYROID 30 MG tablet       . cholecalciferol (VITAMIN D) 1000 UNITS tablet Take 5,000 Units by mouth daily.      . folic acid (FOLVITE) 1 MG tablet Take 30 mg by mouth daily.      Marland Kitchen ibuprofen (ADVIL,MOTRIN) 600 MG tablet       . letrozole (FEMARA) 2.5 MG tablet Take 1 tablet (2.5 mg total) by mouth daily.  90 tablet  3  . MULTIPLE VITAMIN PO Take 1 tablet by mouth.      . NON FORMULARY Take 250 mg by mouth 2 (two) times a week. DMSA      . NON FORMULARY Take 2,000 mg by mouth 2 (two) times a week. EDTA TAKES ON OFF DAYS OF DMSA        OBJECTIVE: Middle aged white woman in no acute distress Filed Vitals:   11/22/11 1028  BP: 128/82  Pulse: 77  Temp: 98.5 F (36.9 C)     Body mass index is 21.14 kg/(m^2).    ECOG FS: 1  Sclerae unicteric Oropharynx clear No cervical or supraclavicular adenopathy Lungs no rales or rhonchi Heart regular rate and rhythm Abd benign MSK no focal spinal tenderness, no peripheral edema;  the DIP joint of her right thumb is partly fixed, and she has very reduced mobility of that finger. Neuro: nonfocal Breasts: Right breast is unremarkable. The left breast is status post mastectomy. There is no evidence of local recurrence  LAB RESULTS:  LH 65.5, FSH 174.9, estradiol  pending  Lab Results  Component Value Date   WBC 8.9 11/14/2011   NEUTROABS 7.2* 11/14/2011   HGB 13.4 11/14/2011   HCT 39.2 11/14/2011   MCV 92.0 11/14/2011   PLT 261 11/14/2011      Chemistry      Component Value Date/Time   NA 140 11/14/2011 1404   K 4.1 11/14/2011 1404  CL 104 11/14/2011 1404   CO2 26 11/14/2011 1404   BUN 14 11/14/2011 1404   CREATININE 0.92 11/14/2011 1404      Component Value Date/Time   CALCIUM 9.8 11/14/2011 1404   CALCIUM 10.1 08/01/2011 1415   ALKPHOS 88 11/14/2011 1404   AST 33 11/14/2011 1404   ALT 43* 11/14/2011 1404   BILITOT 0.7 11/14/2011 1404       Lab Results  Component Value Date   LABCA2 29 11/14/2011    No components found with this basename: [id number]    No results found for this basename: INR:1;PROTIME:1 in the last 168 hours  Urinalysis    Component Value Date/Time   COLORURINE YELLOW 01/25/2007 0748   APPEARANCEUR CLEAR 01/25/2007 0748   LABSPEC 1.012 01/25/2007 0748   PHURINE 6.0 01/25/2007 0748   GLUCOSEU NEGATIVE 01/25/2007 0748   HGBUR MODERATE* 01/25/2007 0748   BILIRUBINUR NEGATIVE 01/25/2007 0748   KETONESUR NEGATIVE 01/25/2007 0748   PROTEINUR NEGATIVE 01/25/2007 0748   UROBILINOGEN 0.2 01/25/2007 0748   NITRITE NEGATIVE 01/25/2007 0748   LEUKOCYTESUR SMALL* 01/25/2007 0748    STUDIES: No results found.  ASSESSMENT: 56 y.o.  BRCA 1-2 negative [facility] woman  (1) status post left mastectomy and sentinel lymph node sampling in July 2011 for a T1 N1 M1, stage IV invasive lobular breast cancer, grade 1, strongly estrogen and progesterone receptor-positive, HER2 negative with MIB-1 of 9% and no HER2 amplification with multiple sclerotic bone lesions at presentation  seen only on CT scan (not on bone scan or PET scan), but now with biopsy-proven metastatic disease to bone; with an elevated CA 27.29 at presentation, and an Oncotype recurrence score of 4, predicting a good response to antiestrogens. She received  a) tamoxifen with evidence of response but poor tolerance  b) letrozole starting August 2012  (2) single functioning kidney  PLAN: Lab work shows her to be convincingly postmenopausal. She is tolerating the letrozole well. We're going to repeat a bone density in January of 2014, and consider denosumab in preference to the bisphosphonates, given her history of a solitary kidney. I am referring her to [facility] for to see if he can help her with her right hand arthritis. Otherwise I think she is doing quite well from a breast cancer point of view, and she will return to see [place] in October, with labs the week before. She knows to call for any problems that may develop before that visit.  Jermale Crass C    11/22/2011

## 2011-11-22 NOTE — Telephone Encounter (Signed)
called dr.holland's office and made patient an appointment for the bone density on 12-02-2011 at 3:00pm

## 2011-11-23 ENCOUNTER — Telehealth: Payer: Self-pay | Admitting: Oncology

## 2011-11-23 NOTE — Telephone Encounter (Signed)
lmonvm adviisng the pt of her appt to see dr sypher in june

## 2012-02-23 ENCOUNTER — Other Ambulatory Visit: Payer: Self-pay | Admitting: Obstetrics and Gynecology

## 2012-02-23 DIAGNOSIS — Z9012 Acquired absence of left breast and nipple: Secondary | ICD-10-CM

## 2012-02-23 DIAGNOSIS — Z1231 Encounter for screening mammogram for malignant neoplasm of breast: Secondary | ICD-10-CM

## 2012-02-24 ENCOUNTER — Ambulatory Visit
Admission: RE | Admit: 2012-02-24 | Discharge: 2012-02-24 | Disposition: A | Discharge status: Home / Self Care | Payer: 59 | Source: Ambulatory Visit | Attending: Obstetrics and Gynecology | Admitting: Obstetrics and Gynecology

## 2012-02-24 DIAGNOSIS — Z9012 Acquired absence of left breast and nipple: Secondary | ICD-10-CM

## 2012-02-24 DIAGNOSIS — Z1231 Encounter for screening mammogram for malignant neoplasm of breast: Secondary | ICD-10-CM

## 2012-03-23 ENCOUNTER — Other Ambulatory Visit (HOSPITAL_BASED_OUTPATIENT_CLINIC_OR_DEPARTMENT_OTHER): Payer: 59

## 2012-03-23 DIAGNOSIS — C50919 Malignant neoplasm of unspecified site of unspecified female breast: Secondary | ICD-10-CM

## 2012-03-23 LAB — COMPREHENSIVE METABOLIC PANEL
CO2: 28 mEq/L (ref 19–32)
Calcium: 10.1 mg/dL (ref 8.4–10.5)
Chloride: 102 mEq/L (ref 96–112)
Creatinine, Ser: 0.79 mg/dL (ref 0.50–1.10)
Glucose, Bld: 89 mg/dL (ref 70–99)
Total Bilirubin: 0.5 mg/dL (ref 0.3–1.2)
Total Protein: 7.3 g/dL (ref 6.0–8.3)

## 2012-03-23 LAB — CBC WITH DIFFERENTIAL/PLATELET
BASO%: 0.2 % (ref 0.0–2.0)
Basophils Absolute: 0 10e3/uL (ref 0.0–0.1)
EOS%: 1.4 % (ref 0.0–7.0)
Eosinophils Absolute: 0.1 10e3/uL (ref 0.0–0.5)
HCT: 39.7 % (ref 34.8–46.6)
HGB: 13.8 g/dL (ref 11.6–15.9)
LYMPH%: 27 % (ref 14.0–49.7)
MCH: 31.3 pg (ref 25.1–34.0)
MCHC: 34.8 g/dL (ref 31.5–36.0)
MCV: 89.9 fL (ref 79.5–101.0)
MONO#: 0.4 10e3/uL (ref 0.1–0.9)
MONO%: 6 % (ref 0.0–14.0)
NEUT#: 4.2 10e3/uL (ref 1.5–6.5)
NEUT%: 65.4 % (ref 38.4–76.8)
Platelets: 277 10e3/uL (ref 145–400)
RBC: 4.42 10e6/uL (ref 3.70–5.45)
RDW: 13.1 % (ref 11.2–14.5)
WBC: 6.5 10e3/uL (ref 3.9–10.3)
lymph#: 1.7 10e3/uL (ref 0.9–3.3)

## 2012-03-24 LAB — CANCER ANTIGEN 27.29: CA 27.29: 34 U/mL (ref 0–39)

## 2012-03-30 ENCOUNTER — Ambulatory Visit: Payer: 59 | Admitting: Physician Assistant

## 2012-04-03 ENCOUNTER — Ambulatory Visit: Payer: 59 | Admitting: Physician Assistant

## 2012-04-04 ENCOUNTER — Telehealth: Payer: Self-pay | Admitting: *Deleted

## 2012-04-04 ENCOUNTER — Ambulatory Visit (HOSPITAL_BASED_OUTPATIENT_CLINIC_OR_DEPARTMENT_OTHER): Payer: 59 | Admitting: Physician Assistant

## 2012-04-04 ENCOUNTER — Encounter: Payer: Self-pay | Admitting: Physician Assistant

## 2012-04-04 VITALS — BP 121/70 | HR 79 | Temp 98.0°F | Resp 20 | Ht 62.0 in | Wt 117.0 lb

## 2012-04-04 DIAGNOSIS — N2589 Other disorders resulting from impaired renal tubular function: Secondary | ICD-10-CM

## 2012-04-04 DIAGNOSIS — C50319 Malignant neoplasm of lower-inner quadrant of unspecified female breast: Secondary | ICD-10-CM

## 2012-04-04 DIAGNOSIS — Q602 Renal agenesis, unspecified: Secondary | ICD-10-CM

## 2012-04-04 DIAGNOSIS — Q605 Renal hypoplasia, unspecified: Secondary | ICD-10-CM

## 2012-04-04 DIAGNOSIS — C7951 Secondary malignant neoplasm of bone: Secondary | ICD-10-CM

## 2012-04-04 DIAGNOSIS — C50919 Malignant neoplasm of unspecified site of unspecified female breast: Secondary | ICD-10-CM

## 2012-04-04 MED ORDER — LETROZOLE 2.5 MG PO TABS: 2.5000 mg | ORAL_TABLET | Freq: Every day | ORAL | Status: DC

## 2012-04-04 NOTE — Progress Notes (Signed)
ID: JAMA MCMILLER   DOB: [birth year]  MR#: [id number]  WUJ#:[id number]  HISTORY OF PRESENT ILLNESS: Sonia Hill had screening mammography on 11/10/2009 showing very dense tissue with a possible distortion in the left breast.  Digital mammography on June 10th showed a 1.5 cm cluster of microcalcifications in the lower inner portion of the breast.  On physical exam, there was no palpable mass and no palpable axillary adenopathy.  There was some nipple inversion inferolaterally.  Ultrasound showed a 2.6 cm irregular mass-like area with dense posterior acoustic shadowing in the 3 o'clock position, 1 cm from the nipple, but normal appearing left axillary lymph nodes.    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  A second mass noted on ultrasound was also lobular, also low grade.  Both were ER and PR positive at well over 90%.  Both had low proliferation markers at 9%, and both were Her-2 negative by CISH with ratios of 1.6 and 1.24.  In short, both masses were nearly identical.    On June 16th, the patient had bilateral breast MRIs which showed in the left breast at 3 o'clock an irregular area of abnormal enhancement measuring up to 2.7 cm and in the left lower inner quadrant, a second post-biopsy area measuring 1.5 cm.  The difference between these two areas was stated at the conference the morning of the visit to be approximately 5 cm.  Given this data the patient opted for Left mastectomy with sentinel lymph node sampling, performed July 2011 with results as detailed below.  INTERVAL HISTORY: Sonia Hill returns for followup of her metastatic breast cancer with bone involvement. She continues to tolerate the letrozole well overall, with few associated side effects. She denies any increased hot flashes and has had no increased joint pain. She is having some vaginal  dryness.Her only other complaints today are dry skin, dry mouth, and a salty taste when she eats.  Interval history is remarkable for Sonia Hill having been evaluated by Dr. Teressa Senter in July for problems with her right thumb. She received a cortisone injection which took care of the problem and she has had no additional pain or limited range of motion.     REVIEW OF SYSTEMS: Sonia Hill and denies any fevers or chills. Her energy level is fair. She's eating and drinking well with no nausea or change in bowel habits. No cough, shortness of breath, or chest pain. No abnormal headaches or dizziness. No peripheral swelling.  A detailed review of systems is otherwise stable and noncontributory.   PAST MEDICAL HISTORY: Past Medical History  Diagnosis Date  . Cancer     lt. breast ca  1. Osteopenia. 2. Congenital left renal atrophy.  3. History of recurrent UTIs. 4. History of bladder reconstruction for reflux more than 18 years ago. 5. History of depression and anxiety. 6. History of hypothyroidism.  7. History of "sling procedure".  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    FAMILY HISTORY The patient's father is alive in his early 47s.  He has Alzheimer's disease. The patient's mother was diagnosed with breast cancer at the age of 75.  She died at the age of 15.  The patient's father had one out of two sisters with breast cancer.  There are other breast cancers on  the mother's side.  The patient has one brother and one younger Sonia, neither with cancer.    GYNECOLOGIC HISTORY: She is GX P1.  That pregnancy was at age 13.  Menarche was at age 66.  She was perimenopausal at the time of diagnosis but is now convincingly postmenopausal  SOCIAL HISTORY: She works as a Doctor, general practice, part-time and on Administrator, sports.  Her husband, Sonia Hill, is a Scientist, physiological at [employer].  Sonia Hill works as a Runner, broadcasting/film/video in  [place].  The patient's son Sonia Hill is a Printmaker in college.  The patient attends [employer].   ADVANCED DIRECTIVES: in place  HEALTH MAINTENANCE: History  Substance Use Topics  . Smoking status: Never Smoker   . Smokeless tobacco: Not on file  . Alcohol Use: No     Colonoscopy:  PAP: UTD  Bone density: Hill 2012 = osteopenia  Lipid panel: 08/12/2011 total 204, HDL 46, LDL 136  Allergies  Allergen Reactions  . Iohexol      Code: HIVES, Desc: pt called 1 day after ct 12-31-09--stated itching, runny nose, red nose---pt taking 50mg  benadryl---pt instructed to call if symptoms change or worsen. Also instructed to do 13hr prep for future contrast studies, Onset Date: [id number]     Current Outpatient Prescriptions  Medication Sig Dispense Refill  . Calcium Carbonate-Vitamin D (CALCIUM-VITAMIN D) 500-200 MG-UNIT per tablet Take 1 tablet by mouth 2 (two) times daily with a meal.      . ARMOUR THYROID 30 MG tablet       . cholecalciferol (VITAMIN D) 1000 UNITS tablet Take 5,000 Units by mouth daily.      . folic acid (FOLVITE) 1 MG tablet Take 30 mg by mouth daily.      Marland Kitchen ibuprofen (ADVIL,MOTRIN) 600 MG tablet       . letrozole (FEMARA) 2.5 MG tablet Take 1 tablet (2.5 mg total) by mouth daily.  30 tablet  11  . MULTIPLE VITAMIN PO Take 1 tablet by mouth.        OBJECTIVE: Middle aged white woman in no acute distress Filed Vitals:   04/04/12 1315  BP: 121/70  Pulse: 79  Temp: 98 F (36.7 C)  Resp: 20     Body mass index is 21.40 kg/(m^2).    ECOG FS: 0 Filed Weights   04/04/12 1315  Weight: 117 lb (53.071 kg)   Sclerae unicteric Oropharynx clear No cervical or supraclavicular adenopathy Lungs no rales or rhonchi Heart regular rate and rhythm Abd soft, nontender, positive bowel sounds MSK no focal spinal tenderness, no peripheral edema; Neuro: nonfocal, alert and oriented x3 Breasts: Right breast is unremarkable. The left breast is status post mastectomy.  There is no evidence of local recurrence. Axillae are benign bilaterally with no adenopathy  LAB RESULTS:   Lab Results  Component Value Date   WBC 6.5 03/23/2012   NEUTROABS 4.2 03/23/2012   HGB 13.8 03/23/2012   HCT 39.7 03/23/2012   MCV 89.9 03/23/2012   PLT 277 03/23/2012      Chemistry      Component Value Date/Time   NA 140 03/23/2012 1557   K 3.7 03/23/2012 1557   CL 102 03/23/2012 1557   CO2 28 03/23/2012 1557   BUN 12 03/23/2012 1557   CREATININE 0.79 03/23/2012 1557      Component Value Date/Time   CALCIUM 10.1 03/23/2012 1557   CALCIUM 10.1 08/01/2011 1415   ALKPHOS 93 03/23/2012 1557   AST 33 03/23/2012 1557  ALT 33 03/23/2012 1557   BILITOT 0.5 03/23/2012 1557       Lab Results  Component Value Date   LABCA2 34 03/23/2012     STUDIES:  Most recent right mammogram on 02/24/2012 was unremarkable.    ASSESSMENT: 56 y.o.  BRCA 1-2 negative [facility] woman  (1) status post left mastectomy and sentinel lymph node sampling in July 2011 for a T1 N1 M1, stage IV invasive lobular breast cancer, grade 1, strongly estrogen and progesterone receptor-positive, HER2 negative with MIB-1 of 9% and no HER2 amplification with multiple sclerotic bone lesions at presentation seen only on CT scan (not on bone scan or PET scan), but now with biopsy-proven metastatic disease to bone; with an elevated CA 27.29 at presentation, and an Oncotype recurrence score of 4, predicting a good response to antiestrogens. She received  a) tamoxifen with evidence of response but poor tolerance  b) letrozole starting August 2012  (2) single functioning kidney  PLAN:  With regards to her breast cancer, Monque is doing well, and will continue on letrozole as before. This was refilled for her today. She's being scheduled for repeat bone density in January of 2014.  She will see Dr. Darnelle Catalan soon thereafter to review those results and if necessary we will consider denosumab in preference to  the bisphosphonates, given her history of a solitary kidney.   Ovella voices understanding and agreement with this plan, and knows to call she has any changes or problems prior to her next scheduled visit.    Ho Parisi    04/04/2012

## 2012-04-04 NOTE — Telephone Encounter (Signed)
Gave [facility] appointment [facility] on 06-25-2012 at 1:00pm

## 2012-06-28 ENCOUNTER — Other Ambulatory Visit: Payer: Self-pay | Admitting: *Deleted

## 2012-06-28 ENCOUNTER — Other Ambulatory Visit: Payer: 59 | Admitting: Lab

## 2012-06-28 DIAGNOSIS — C50919 Malignant neoplasm of unspecified site of unspecified female breast: Secondary | ICD-10-CM

## 2012-06-29 ENCOUNTER — Other Ambulatory Visit (HOSPITAL_BASED_OUTPATIENT_CLINIC_OR_DEPARTMENT_OTHER): Payer: 59 | Admitting: Lab

## 2012-06-29 DIAGNOSIS — C50919 Malignant neoplasm of unspecified site of unspecified female breast: Secondary | ICD-10-CM

## 2012-06-29 DIAGNOSIS — C50319 Malignant neoplasm of lower-inner quadrant of unspecified female breast: Secondary | ICD-10-CM

## 2012-06-29 LAB — CBC WITH DIFFERENTIAL/PLATELET
BASO%: 0.3 % (ref 0.0–2.0)
Eosinophils Absolute: 0.1 10*3/uL (ref 0.0–0.5)
HCT: 36.1 % (ref 34.8–46.6)
LYMPH%: 25.3 % (ref 14.0–49.7)
MCHC: 35.6 g/dL (ref 31.5–36.0)
MONO#: 0.4 10*3/uL (ref 0.1–0.9)
NEUT#: 5.1 10*3/uL (ref 1.5–6.5)
Platelets: 246 10*3/uL (ref 145–400)
RBC: 4.06 10*6/uL (ref 3.70–5.45)
WBC: 7.5 10*3/uL (ref 3.9–10.3)
lymph#: 1.9 10*3/uL (ref 0.9–3.3)

## 2012-06-29 LAB — COMPREHENSIVE METABOLIC PANEL (CC13)
ALT: 24 U/L (ref 0–55)
Albumin: 3.6 g/dL (ref 3.5–5.0)
CO2: 27 mEq/L (ref 22–29)
Calcium: 9.7 mg/dL (ref 8.4–10.4)
Chloride: 103 mEq/L (ref 98–107)
Glucose: 94 mg/dl (ref 70–99)
Sodium: 137 mEq/L (ref 136–145)
Total Bilirubin: 0.49 mg/dL (ref 0.20–1.20)
Total Protein: 7 g/dL (ref 6.4–8.3)

## 2012-07-05 ENCOUNTER — Ambulatory Visit (HOSPITAL_BASED_OUTPATIENT_CLINIC_OR_DEPARTMENT_OTHER): Payer: 59 | Admitting: Oncology

## 2012-07-05 VITALS — BP 134/81 | HR 76 | Temp 98.1°F | Resp 20 | Ht 62.0 in | Wt 118.3 lb

## 2012-07-05 DIAGNOSIS — M810 Age-related osteoporosis without current pathological fracture: Secondary | ICD-10-CM

## 2012-07-05 DIAGNOSIS — Z170 Estrogen receptor positive status [ER+]: Secondary | ICD-10-CM

## 2012-07-05 DIAGNOSIS — C50919 Malignant neoplasm of unspecified site of unspecified female breast: Secondary | ICD-10-CM

## 2012-07-05 DIAGNOSIS — C7952 Secondary malignant neoplasm of bone marrow: Secondary | ICD-10-CM

## 2012-07-05 DIAGNOSIS — C50319 Malignant neoplasm of lower-inner quadrant of unspecified female breast: Secondary | ICD-10-CM

## 2012-07-05 NOTE — Progress Notes (Signed)
ID: Sonia Hill   DOB: 1955-11-09  MR#: [id number]  WUJ#:[id number]  PCP: Angelena Sole, MD GYN: Richarda Overlie SU: Manus Rudd OTHER MD: Napoleon Form  HISTORY OF PRESENT ILLNESS: Sonia Hill had screening mammography on 11/10/2009 showing very dense tissue with a possible distortion in the left breast.  Digital mammography on June 10th showed a 1.5 cm cluster of microcalcifications in the lower inner portion of the breast.  On physical exam, there was no palpable mass and no palpable axillary adenopathy.  There was some nipple inversion inferolaterally.  Ultrasound showed a 2.6 cm irregular mass-like area with dense posterior acoustic shadowing in the 3 o'clock position, 1 cm from the nipple, but normal appearing left axillary lymph nodes.    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  A second mass noted on ultrasound was also lobular, also low grade.  Both were ER and PR positive at well over 90%.  Both had low proliferation markers at 9%, and both were Her-2 negative by CISH with ratios of 1.6 and 1.24.  In short, both masses were nearly identical.    On June 16th, the patient had bilateral breast MRIs which showed in the left breast at 3 o'clock an irregular area of abnormal enhancement measuring up to 2.7 cm and in the left lower inner quadrant, a second post-biopsy area measuring 1.5 cm.  The difference between these two areas was stated at the conference the morning of the visit to be approximately 5 cm.  Given this data the patient opted for Left mastectomy with sentinel lymph node sampling, performed July 2011 with results as detailed below.  INTERVAL HISTORY: Sonia Hill returns today with her husband Sonia Hill for followup of her metastatic breast cancer with bone involvement. The interval history is generally unremarkable. 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 She just had a bone density under Dr. Marcelle Overlie which shows her bones to be osteoporotic  REVIEW OF SYSTEMS: She is tolerating the letrozole well, although she complains of some hair loss, dry skin, and some taste alterations. Hot flashes are minimal, and she is treating vaginal dryness issues with coconut oil. She has chronic tinnitus. She has some sinus issues. She has chronic low back pain, which is intermittent, and not more intense or frequent than prior. A detailed review of systems today was otherwise noncontributory.  PAST MEDICAL HISTORY: Past Medical History  Diagnosis Date  . Cancer     lt. breast ca  1. Osteopenia. 2. Congenital left renal atrophy.  3. History of recurrent UTIs. 4. History of bladder reconstruction for reflux more than 18 years ago. 5. History of depression and anxiety. 6. History of hypothyroidism.  7. History of "sling procedure".  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    FAMILY HISTORY The patient's father is alive in his early 13s.  He has Alzheimer's disease. The patient's mother was diagnosed with breast cancer at the age of 70.  She died at the age of 75.  The patient's father had one out of two sisters with breast cancer.  There are other breast cancers on the mother's side.  The patient has one brother and one younger sister, neither with cancer.    GYNECOLOGIC HISTORY: She is GX P1.  That pregnancy was at  age 18.  Menarche was at age 46.  She was perimenopausal at the time of diagnosis but is now convincingly postmenopausal  SOCIAL HISTORY: She works as a Doctor, general practice, part-time and on Administrator, sports.  Her husband, Sonia Hill, is a Scientist, physiological at [employer].  Sister Sonia Hill works as a Runner, broadcasting/film/video in [place].  The patient's son Sonia Hill is  in college.  The patient attends [employer].   ADVANCED DIRECTIVES: in place  HEALTH  MAINTENANCE: History  Substance Use Topics  . Smoking status: Never Smoker   . Smokeless tobacco: Not on file  . Alcohol Use: No     Colonoscopy:  PAP: UTD  Bone density: Sonia Hill 2014: osteoporosis  Lipid panel: 08/12/2011 total 204, HDL 46, LDL 136  Allergies  Allergen Reactions  . Iohexol      Code: HIVES, Desc: pt called 1 day after ct 12-31-09--stated itching, runny nose, red nose---pt taking 50mg  benadryl---pt instructed to call if symptoms change or worsen. Also instructed to do 13hr prep for future contrast studies, Onset Date: [id number]     Current Outpatient Prescriptions  Medication Sig Dispense Refill  . ARMOUR THYROID 30 MG tablet       . Calcium Carbonate-Vitamin D (CALCIUM-VITAMIN D) 500-200 MG-UNIT per tablet Take 1 tablet by mouth 2 (two) times daily with a meal.      . cholecalciferol (VITAMIN D) 1000 UNITS tablet Take 5,000 Units by mouth daily.      . folic acid (FOLVITE) 1 MG tablet Take 30 mg by mouth daily.      Marland Kitchen ibuprofen (ADVIL,MOTRIN) 600 MG tablet       . letrozole (FEMARA) 2.5 MG tablet Take 1 tablet (2.5 mg total) by mouth daily.  30 tablet  11  . MULTIPLE VITAMIN PO Take 1 tablet by mouth.        OBJECTIVE: Middle aged white woman in no acute distress Filed Vitals:   07/05/12 1609  BP: 134/81  Pulse: 76  Temp: 98.1 F (36.7 C)  Resp: 20     Body mass index is 21.64 kg/(m^2).    ECOG FS: 0 Filed Weights   07/05/12 1609  Weight: 118 lb 4.8 oz (53.661 kg)   Sclerae unicteric Oropharynx clear No cervical or supraclavicular adenopathy Lungs no rales or rhonchi Heart regular rate and rhythm Abd soft, nontender, positive bowel sounds MSK no focal spinal tenderness to 4 and vigorous palpation and percussion, no peripheral edema;  Neuro: nonfocal, well oriented, upper. Affect Breasts: Right breast is unremarkable. The left breast is status post mastectomy. There is no evidence of local recurrence. Axillae are benign bilaterally   LAB  RESULTS:   Lab Results  Component Value Date   WBC 7.5 06/29/2012   NEUTROABS 5.1 06/29/2012   HGB 12.8 06/29/2012   HCT 36.1 06/29/2012   MCV 89.0 06/29/2012   PLT 246 06/29/2012      Chemistry      Component Value Date/Time   NA 137 06/29/2012 1522   NA 140 03/23/2012 1557   K 3.7 06/29/2012 1522   K 3.7 03/23/2012 1557   CL 103 06/29/2012 1522   CL 102 03/23/2012 1557   CO2 27 06/29/2012 1522   CO2 28 03/23/2012 1557   BUN 13.0 06/29/2012 1522   BUN 12 03/23/2012 1557   CREATININE 0.8 06/29/2012 1522   CREATININE 0.79 03/23/2012 1557      Component Value Date/Time   CALCIUM 9.7 06/29/2012 1522   CALCIUM 10.1 03/23/2012 1557  CALCIUM 10.1 08/01/2011 1415   ALKPHOS 89 06/29/2012 1522   ALKPHOS 93 03/23/2012 1557   AST 26 06/29/2012 1522   AST 33 03/23/2012 1557   ALT 24 06/29/2012 1522   ALT 33 03/23/2012 1557   BILITOT 0.49 06/29/2012 1522   BILITOT 0.5 03/23/2012 1557       Lab Results  Component Value Date   LABCA2 34 03/23/2012     STUDIES:  Most recent right mammogram on 02/24/2012 was unremarkable. [facility] shows a T score of -2.6 in the lumbar spine, -2.3/2.4 at the femoral necks.    ASSESSMENT: 57 y.o.  BRCA 1-2 negative [facility] woman  (1) status post left mastectomy and sentinel lymph node sampling in July 2011 for a T1 N1 M1, stage IV invasive lobular breast cancer, grade 1, strongly estrogen and progesterone receptor-positive, HER2 negative with MIB-1 of 9% and no HER2 amplification,  with multiple sclerotic bone lesions at presentation seen only on CT scan (not on bone scan or PET scan), but now with biopsy-proven metastatic disease to bone; with an elevated CA 27.29 at presentation, and an Oncotype recurrence score of 4, predicting a good response to antiestrogens. She received  a) tamoxifen with evidence of response but poor tolerance  b) letrozole starting August 2012  (2) single functioning  kidney  PLAN:  As far as breast cancer is concerned, Kaytlin has absolutely no symptoms of disease activity. She is tolerating the letrozole moderately well. Her osteoporosis is a concern, and we spent the better part of her hour-plus visit today discussing treatment options. These include calcitonin, which is limited by tachyphylaxis; Forteo, which requires daily injections; the bisphosphonates, particularly zoledronic acid, which might adversely affect her kidney; and denosumab.  My feeling is that she would do best with denosuman/Xgeva. She could receive this once or twice a year for osteoporosis treatment. We have data on using zoledronic acid monthly in patients with breast cancer metastatic to bone, and there is a survival improvement. I am not aware of similar data for denosumab. Aside from hypocalcemia, there is a concern regarding osteonecrosis of the jaw, although this would be rare in the absence of dental implants or extractions.  All of this was discussed in detail with Raynelle Fanning and Sonia Hill. At this point she is interested in exploring the possibility of Forteo. This is not a treatment I have prescribed in the past, and I encouraged her to discuss it with her other physicians. Otherwise the plan is to continue letrozole on until we have evidence of disease progression. Alethea will return to see [place] again in 3 months. She knows to call for any problems that may develop before that.    Claira Jeter C    07/05/2012

## 2012-07-06 ENCOUNTER — Telehealth: Payer: Self-pay | Admitting: Oncology

## 2012-07-06 NOTE — Telephone Encounter (Signed)
lvm for pt regarding  April appt.Marland KitchenMarland KitchenMarland KitchenDone

## 2012-07-11 ENCOUNTER — Other Ambulatory Visit: Payer: Self-pay | Admitting: *Deleted

## 2012-07-11 ENCOUNTER — Other Ambulatory Visit (HOSPITAL_BASED_OUTPATIENT_CLINIC_OR_DEPARTMENT_OTHER): Payer: 59 | Admitting: Lab

## 2012-07-11 ENCOUNTER — Encounter: Payer: Self-pay | Admitting: *Deleted

## 2012-07-11 ENCOUNTER — Telehealth: Payer: Self-pay | Admitting: Oncology

## 2012-07-11 DIAGNOSIS — C50919 Malignant neoplasm of unspecified site of unspecified female breast: Secondary | ICD-10-CM

## 2012-07-11 DIAGNOSIS — C50319 Malignant neoplasm of lower-inner quadrant of unspecified female breast: Secondary | ICD-10-CM

## 2012-07-11 NOTE — Telephone Encounter (Signed)
AB out of office 4/18. Moved lb appt to 4/11 per pt and AB to 4/23 per pt. Pt has both new appt d/t.

## 2012-07-11 NOTE — Progress Notes (Signed)
Received call from patient stating that her CA27.29 was not drawn and patient is requesting this to be done.  Appt. Made for lab and pt. Aware.

## 2012-07-12 LAB — CANCER ANTIGEN 27.29: CA 27.29: 31 U/mL (ref 0–39)

## 2012-08-21 ENCOUNTER — Other Ambulatory Visit: Payer: Self-pay | Admitting: *Deleted

## 2012-08-24 ENCOUNTER — Other Ambulatory Visit: Payer: 59 | Admitting: Lab

## 2012-09-20 ENCOUNTER — Other Ambulatory Visit: Payer: Self-pay | Admitting: Physician Assistant

## 2012-09-20 DIAGNOSIS — C50919 Malignant neoplasm of unspecified site of unspecified female breast: Secondary | ICD-10-CM

## 2012-09-21 ENCOUNTER — Other Ambulatory Visit: Payer: Self-pay | Admitting: *Deleted

## 2012-09-21 ENCOUNTER — Other Ambulatory Visit (HOSPITAL_BASED_OUTPATIENT_CLINIC_OR_DEPARTMENT_OTHER): Payer: 59 | Admitting: Lab

## 2012-09-21 DIAGNOSIS — C50919 Malignant neoplasm of unspecified site of unspecified female breast: Secondary | ICD-10-CM

## 2012-09-21 DIAGNOSIS — C7951 Secondary malignant neoplasm of bone: Secondary | ICD-10-CM

## 2012-09-21 DIAGNOSIS — C50319 Malignant neoplasm of lower-inner quadrant of unspecified female breast: Secondary | ICD-10-CM

## 2012-09-21 LAB — COMPREHENSIVE METABOLIC PANEL (CC13)
ALT: 19 U/L (ref 0–55)
AST: 24 U/L (ref 5–34)
CO2: 27 mEq/L (ref 22–29)
Calcium: 9.6 mg/dL (ref 8.4–10.4)
Chloride: 102 mEq/L (ref 98–107)
Creatinine: 0.9 mg/dL (ref 0.6–1.1)
Potassium: 4 mEq/L (ref 3.5–5.1)
Sodium: 138 mEq/L (ref 136–145)
Total Protein: 7 g/dL (ref 6.4–8.3)

## 2012-09-21 LAB — CBC WITH DIFFERENTIAL/PLATELET
BASO%: 0.4 % (ref 0.0–2.0)
EOS%: 1.6 % (ref 0.0–7.0)
MCH: 30.4 pg (ref 25.1–34.0)
MCHC: 34.5 g/dL (ref 31.5–36.0)
MONO#: 0.4 10*3/uL (ref 0.1–0.9)
NEUT%: 71.8 % (ref 38.4–76.8)
RBC: 4.3 10*6/uL (ref 3.70–5.45)
RDW: 12.9 % (ref 11.2–14.5)
WBC: 6.5 10*3/uL (ref 3.9–10.3)
lymph#: 1.3 10*3/uL (ref 0.9–3.3)

## 2012-09-28 ENCOUNTER — Other Ambulatory Visit: Payer: 59 | Admitting: Lab

## 2012-09-28 ENCOUNTER — Ambulatory Visit: Payer: 59 | Admitting: Physician Assistant

## 2012-10-03 ENCOUNTER — Ambulatory Visit (HOSPITAL_BASED_OUTPATIENT_CLINIC_OR_DEPARTMENT_OTHER): Payer: 59 | Admitting: Physician Assistant

## 2012-10-03 ENCOUNTER — Telehealth: Payer: Self-pay | Admitting: *Deleted

## 2012-10-03 ENCOUNTER — Encounter: Payer: Self-pay | Admitting: Physician Assistant

## 2012-10-03 VITALS — BP 122/75 | HR 80 | Temp 98.3°F | Resp 20 | Ht 62.0 in | Wt 109.4 lb

## 2012-10-03 DIAGNOSIS — R922 Inconclusive mammogram: Secondary | ICD-10-CM

## 2012-10-03 DIAGNOSIS — C50919 Malignant neoplasm of unspecified site of unspecified female breast: Secondary | ICD-10-CM | POA: Insufficient documentation

## 2012-10-03 DIAGNOSIS — Z1231 Encounter for screening mammogram for malignant neoplasm of breast: Secondary | ICD-10-CM

## 2012-10-03 DIAGNOSIS — R9230 Dense breasts, unspecified: Secondary | ICD-10-CM

## 2012-10-03 DIAGNOSIS — C50912 Malignant neoplasm of unspecified site of left female breast: Secondary | ICD-10-CM

## 2012-10-03 DIAGNOSIS — C7951 Secondary malignant neoplasm of bone: Secondary | ICD-10-CM

## 2012-10-03 NOTE — Telephone Encounter (Signed)
appts made and printed.Per pt request to schedule her appts and call in make her aware of them verbally along with mailing them. She will come in tomorrow to have a lab due to she had to get back to work today and did have enough to wait for the lab. I hope she come by scheduling so that i can print it for her, but will mail it away...td

## 2012-10-03 NOTE — Progress Notes (Signed)
ID: Sonia Hill   DOB: 08-10-1955  MR#: [id number]  OZH#:[id number]  PCP: Angelena Sole, MD GYN: Richarda Overlie SU: Manus Rudd OTHER MD: Napoleon Form  HISTORY OF PRESENT ILLNESS: Sonia Hill had screening mammography on 11/10/2009 showing very dense tissue with a possible distortion in the left breast.  Digital mammography on June 10th showed a 1.5 cm cluster of microcalcifications in the lower inner portion of the breast.  On physical exam, there was no palpable mass and no palpable axillary adenopathy.  There was some nipple inversion inferolaterally.  Ultrasound showed a 2.6 cm irregular mass-like area with dense posterior acoustic shadowing in the 3 o'clock position, 1 cm from the nipple, but normal appearing left axillary lymph nodes.    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  A second mass noted on ultrasound was also lobular, also low grade.  Both were ER and PR positive at well over 90%.  Both had low proliferation markers at 9%, and both were Her-2 negative by CISH with ratios of 1.6 and 1.24.  In short, both masses were nearly identical.    On June 16th, the patient had bilateral breast MRIs which showed in the left breast at 3 o'clock an irregular area of abnormal enhancement measuring up to 2.7 cm and in the left lower inner quadrant, a second post-biopsy area measuring 1.5 cm.  The difference between these two areas was stated at the conference the morning of the visit to be approximately 5 cm.  Given this data the patient opted for Left mastectomy with sentinel lymph node sampling, performed July 2011 with results as detailed below.  INTERVAL HISTORY: Sonia Hill returns today with her husband Sonia Hill for followup of her metastatic breast cancer with known bone involvement. The interval history is generally unremarkable. She continues on  letrozole which she is tolerating well, 2.5 mg daily. She's had no significant hot flashes. She denies any increased joint pain. She is concerned about some increased "tingling" and "spasm-like" pain in her mid back. This pain is not new, but seems to be occurring more frequently than it has in the past. There is no radiating pain into the lower extremities.  Sonia Hill continues to declined bisphosphonate therapy. She has been attending a program called "[facility]ste[employer]"  which apparently involves "exercising on a machine" once weekly to build bone density.  REVIEW OF SYSTEMS: Sonia Hill denies any recent illnesses and has had no fevers or chills. Her energy level is fair. She is concerned she's lost approximately 10 pounds since January without trying. She does eat a very healthy diet, but her diet has not changed since January. If anything, she was exercising less due to the cold weather. She denies any abdominal pain, and has had no nausea, emesis, or change in bowel or bladder habits. She's had no cough, increased shortness of breath, chest pain, or palpitations. She denies any abnormal headaches or dizziness and has had no change in vision. Other than the back pain noted above, she denies any new or unusual myalgias, arthralgias, or bony pain. Her legs feel restless at night, and she continues to have some sleeplessness, all though overall this has actually improved.  A detailed review of systems is otherwise stable and noncontributory.   PAST MEDICAL HISTORY: Past Medical History  Diagnosis Date  . Cancer     lt. breast ca  1. Osteopenia. 2.  Congenital left renal atrophy.  3. History of recurrent UTIs. 4. History of bladder reconstruction for reflux more than 18 years ago. 5. History of depression and anxiety. 6. History of hypothyroidism.  7. History of "sling procedure".  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    FAMILY  HISTORY The patient's father is alive in his early 69s.  He has Alzheimer's disease. The patient's mother was diagnosed with breast cancer at the age of 7.  She died at the age of 59.  The patient's father had one out of two sisters with breast cancer.  There are other breast cancers on the mother's side.  The patient has one brother and one younger sister, neither with cancer.    GYNECOLOGIC HISTORY: She is GX P1.  That pregnancy was at age 43.  Menarche was at age 11.  She was perimenopausal at the time of diagnosis but is now convincingly postmenopausal  SOCIAL HISTORY: She works as a Doctor, general practice, part-time and on Administrator, sports.  Her husband, Sonia Hill, is a Scientist, physiological at [employer].  Sister Sonia Hill works as a Runner, broadcasting/film/video in [place].  The patient's son Sonia Hill is  in college.  The patient attends [employer].   ADVANCED DIRECTIVES: in place  HEALTH MAINTENANCE: History  Substance Use Topics  . Smoking status: Never Smoker   . Smokeless tobacco: Never Used  . Alcohol Use: No     Colonoscopy:  PAP: UTD  Bone density: Sonia Hill 2014: osteoporosis  Lipid panel: 08/12/2011 total 204, HDL 46, LDL 136  Allergies  Allergen Reactions  . Iohexol      Code: HIVES, Desc: pt called 1 day after ct 12-31-09--stated itching, runny nose, red nose---pt taking 50mg  benadryl---pt instructed to call if symptoms change or worsen. Also instructed to do 13hr prep for future contrast studies, Onset Date: [id number]     Current Outpatient Prescriptions  Medication Sig Dispense Refill  . ARMOUR THYROID 30 MG tablet Take 60 mg by mouth daily.       . Calcium Carbonate-Vitamin D (CALCIUM-VITAMIN D) 500-200 MG-UNIT per tablet Take 1 tablet by mouth 2 (two) times daily with a meal.      . cholecalciferol (VITAMIN D) 1000 UNITS tablet Take by mouth daily. Alternates dose of 7000 units and 84132 units every other day      . folic acid (FOLVITE) 1 MG tablet Take 30 mg by mouth daily.      Marland Kitchen ibuprofen  (ADVIL,MOTRIN) 600 MG tablet       . letrozole (FEMARA) 2.5 MG tablet Take 1 tablet (2.5 mg total) by mouth daily.  30 tablet  11  . milk thistle 175 MG tablet Take 175 mg by mouth daily.      . MULTIPLE VITAMIN PO Take 1 tablet by mouth.      . Probiotic Product (PROBIOTIC DAILY PO) Take 1 tablet by mouth.       No current facility-administered medications for this visit.    OBJECTIVE: Middle aged white woman in no acute distress Filed Vitals:   10/03/12 1322  BP: 122/75  Pulse: 80  Temp: 98.3 F (36.8 C)  Resp: 20     Body mass index is 20 kg/(m^2).    ECOG FS: 0 Filed Weights   10/03/12 1322  Weight: 109 lb 6.4 oz (49.624 kg)   Sclerae unicteric Oropharynx clear No cervical or supraclavicular adenopathy Lungs clear to auscultation bilaterally, no rales or  rhonchi Heart regular rate and rhythm Abdomen  soft, nontender to palpation, positive bowel sounds MSK no focal spinal tenderness to vigorous palpation, no peripheral edema;  Neuro: nonfocal, well oriented, positive affect Breasts: Right breast is unremarkable. The left breast is status post mastectomy. There is no evidence of local recurrence. Axillae are benign bilaterally with no palpable adenopathy noted.  LAB RESULTS:   Lab Results  Component Value Date   WBC 6.5 09/21/2012   NEUTROABS 4.7 09/21/2012   HGB 13.1 09/21/2012   HCT 37.9 09/21/2012   MCV 88.1 09/21/2012   PLT 242 09/21/2012      Chemistry      Component Value Date/Time   NA 138 09/21/2012 1521   NA 140 03/23/2012 1557   K 4.0 09/21/2012 1521   K 3.7 03/23/2012 1557   CL 102 09/21/2012 1521   CL 102 03/23/2012 1557   CO2 27 09/21/2012 1521   CO2 28 03/23/2012 1557   BUN 11.1 09/21/2012 1521   BUN 12 03/23/2012 1557   CREATININE 0.9 09/21/2012 1521   CREATININE 0.79 03/23/2012 1557      Component Value Date/Time   CALCIUM 9.6 09/21/2012 1521   CALCIUM 10.1 03/23/2012 1557   CALCIUM 10.1 08/01/2011 1415   ALKPHOS 95 09/21/2012 1521   ALKPHOS 93  03/23/2012 1557   AST 24 09/21/2012 1521   AST 33 03/23/2012 1557   ALT 19 09/21/2012 1521   ALT 33 03/23/2012 1557   BILITOT 0.69 09/21/2012 1521   BILITOT 0.5 03/23/2012 1557       Lab Results  Component Value Date   LABCA2 31 07/11/2012  Tumor Marker was repeated today, 10/03/2012, with results pending.   STUDIES:  Most recent right mammogram on 02/24/2012 was unremarkable with the exception of "extremely dense" breast tissue.  [facility] shows a T score of -2.6 in the lumbar spine, -2.3/2.4 at the femoral necks.    ASSESSMENT: 57 y.o.  BRCA 1-2 negative [facility] woman  (1) status post left mastectomy and sentinel lymph node sampling in July 2011 for a T1 N1 M1, stage IV invasive lobular breast cancer, grade 1, strongly estrogen and progesterone receptor-positive, HER2 negative with MIB-1 of 9% and no HER2 amplification,  with multiple sclerotic bone lesions at presentation seen only on CT scan (not on bone scan or PET scan), but now with biopsy-proven metastatic disease to bone; with an elevated CA 27.29 at presentation, and an Oncotype recurrence score of 4, predicting a good response to antiestrogens. She received  a) tamoxifen with evidence of response but poor tolerance  b) letrozole starting August 2012  (2) single functioning kidney  PLAN:  Over half of our 50 minute appointment today was spent discussing the patient's concerns, reviewing her treatment plan, and coordinating care.   Sonia Hill continues to feel well overall, and will continue on the letrozole 2.5 mg daily. She continues to decline bisphosphonate therapy, and will continue to participate through the local  "OsteoStrong" program for her osteoporosis.  We did discuss the fact that Sonia Hill has lost approximately 10 pounds over the last 3 months without swelling. She's also having some increased "muscle spasms" in her mid back. Due to these recent changes, we will plan on  seeing her back in 6-8 weeks rather than waiting 3 months, and we will continue to follow her very closely. Of course, her Sonia Hill's request, we'll continue to follow her tumor marker as well, and will use this  only as a "red flag" to pursue additional imaging/studies, but she understands that we will not adjust her treatment plan based on the tumor marker number alone. I have asked her, however, to call us if she loses any additional weight, has any increased back pain, or any additional changes or problems prior to her appointment here in June.  Sonia Hill is due for her next right mammogram in September. She does have a history of very dense breast tissue, and we will request a breast MRI be obtained at the same time. All this has been scheduled accordingly. Sonia Hill herself voices understanding and agreement with this plan.  I will mention that I reviewed with Sonia Hill some recent data regarding milk thistle and the fact that it has been contraindicated in women with estrogen positive breast cancers. She will share this information with her other physician, Dr. Lelon Perla, in [place] for his feedback as well.    Sonia Hill    10/03/2012

## 2012-10-03 NOTE — Telephone Encounter (Signed)
sw pt she is aware of her appts d/t. Pt is also aware that cs will call to schedule her other appts....td

## 2012-10-04 ENCOUNTER — Other Ambulatory Visit: Payer: 59 | Admitting: Lab

## 2012-10-04 DIAGNOSIS — C50912 Malignant neoplasm of unspecified site of left female breast: Secondary | ICD-10-CM

## 2012-10-04 DIAGNOSIS — C50919 Malignant neoplasm of unspecified site of unspecified female breast: Secondary | ICD-10-CM

## 2012-12-03 ENCOUNTER — Other Ambulatory Visit (HOSPITAL_BASED_OUTPATIENT_CLINIC_OR_DEPARTMENT_OTHER): Payer: 59 | Admitting: Lab

## 2012-12-03 ENCOUNTER — Other Ambulatory Visit: Payer: Self-pay | Admitting: *Deleted

## 2012-12-03 DIAGNOSIS — C7951 Secondary malignant neoplasm of bone: Secondary | ICD-10-CM

## 2012-12-03 DIAGNOSIS — C7952 Secondary malignant neoplasm of bone marrow: Secondary | ICD-10-CM

## 2012-12-03 DIAGNOSIS — C50919 Malignant neoplasm of unspecified site of unspecified female breast: Secondary | ICD-10-CM

## 2012-12-03 DIAGNOSIS — C50912 Malignant neoplasm of unspecified site of left female breast: Secondary | ICD-10-CM

## 2012-12-03 LAB — CBC WITH DIFFERENTIAL/PLATELET
BASO%: 0.3 % (ref 0.0–2.0)
Basophils Absolute: 0 10*3/uL (ref 0.0–0.1)
EOS%: 0.9 % (ref 0.0–7.0)
HGB: 13.2 g/dL (ref 11.6–15.9)
MCH: 30.4 pg (ref 25.1–34.0)
MCHC: 35.1 g/dL (ref 31.5–36.0)
MCV: 86.6 fL (ref 79.5–101.0)
MONO%: 6.6 % (ref 0.0–14.0)
NEUT%: 73.1 % (ref 38.4–76.8)
RDW: 12.9 % (ref 11.2–14.5)

## 2012-12-03 LAB — COMPREHENSIVE METABOLIC PANEL (CC13)
ALT: 24 U/L (ref 0–55)
AST: 24 U/L (ref 5–34)
Alkaline Phosphatase: 86 U/L (ref 40–150)
BUN: 14.7 mg/dL (ref 7.0–26.0)
Creatinine: 0.8 mg/dL (ref 0.6–1.1)

## 2012-12-10 ENCOUNTER — Ambulatory Visit (HOSPITAL_BASED_OUTPATIENT_CLINIC_OR_DEPARTMENT_OTHER): Payer: 59 | Admitting: Oncology

## 2012-12-10 VITALS — BP 128/72 | HR 83 | Temp 98.4°F | Resp 20 | Ht 62.0 in | Wt 107.2 lb

## 2012-12-10 DIAGNOSIS — C50912 Malignant neoplasm of unspecified site of left female breast: Secondary | ICD-10-CM

## 2012-12-10 DIAGNOSIS — C7951 Secondary malignant neoplasm of bone: Secondary | ICD-10-CM

## 2012-12-10 DIAGNOSIS — C50919 Malignant neoplasm of unspecified site of unspecified female breast: Secondary | ICD-10-CM

## 2012-12-10 NOTE — Progress Notes (Signed)
ID: CRYSTEN KAMAN   DOB: 10-Aug-1955  MR#: [id number]  JYN#:[id number]  PCP: Angelena Sole, MD GYN: Richarda Overlie SU: Manus Rudd OTHER MD: Napoleon Form  HISTORY OF PRESENT ILLNESS: Marki had screening mammography on 11/10/2009 showing very dense tissue with a possible distortion in the left breast.  Digital mammography on June 10th showed a 1.5 cm cluster of microcalcifications in the lower inner portion of the breast.  On physical exam, there was no palpable mass and no palpable axillary adenopathy.  There was some nipple inversion inferolaterally.  Ultrasound showed a 2.6 cm irregular mass-like area with dense posterior acoustic shadowing in the 3 o'clock position, 1 cm from the nipple, but normal appearing left axillary lymph nodes.    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  A second mass noted on ultrasound was also lobular, also low grade.  Both were ER and PR positive at well over 90%.  Both had low proliferation markers at 9%, and both were Her-2 negative by CISH with ratios of 1.6 and 1.24.  In short, both masses were nearly identical.    On June 16th, the patient had bilateral breast MRIs which showed in the left breast at 3 o'clock an irregular area of abnormal enhancement measuring up to 2.7 cm and in the left lower inner quadrant, a second post-biopsy area measuring 1.5 cm.  The difference between these two areas was stated at the conference the morning of the visit to be approximately 5 cm.  Given this data the patient opted for Left mastectomy with sentinel lymph node sampling, performed July 2011 with results as detailed below.  INTERVAL HISTORY: Gale returns today for followup of her metastatic breast cancer with known bone involvement. The interval history is significant for her having discussed her situation extensively  with Dr. Marcelle Overlie. He set her up for colonoscopy in August and they are considering bilateral salpingo-oophorectomy both as a preventative and to evaluate for possible metastatic spread to the ovaries or fallopian tubes.  REVIEW OF SYSTEMS: Zamyra has pain in her mid back. She tells me she h "osteo-strong" programas had this from the very beginning and in fact since before her diagnosis of breast cancer. She does not think it is more intense or more frequent but it does bother her son. She is participating in the [place] program of (instead of bisphosphonates). She appears to be tolerating that well. She has a little bit of a sore throat and hoarseness, likely secondary to seasonal allergies. Otherwise a detailed review of systems today was noncontributory   PAST MEDICAL HISTORY: Past Medical History  Diagnosis Date  . Cancer     lt. breast ca  1. Osteopenia. 2. Congenital left renal atrophy.  3. History of recurrent UTIs. 4. History of bladder reconstruction for reflux more than 18 years ago. 5. History of depression and anxiety. 6. History of hypothyroidism.  7. History of "sling procedure".  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    FAMILY HISTORY The patient's father is alive in his early 14s.  He has Alzheimer's disease. The patient's mother was diagnosed with breast cancer at the age of 41.  She died at the age of 67.  The patient's father had one out of two sisters with breast cancer.  There are other breast cancers  on the mother's side.  The patient has one brother and one younger sister, neither with cancer.    GYNECOLOGIC HISTORY: She is GX P1.  That pregnancy was at age 33.  Menarche was at age 72.  She was perimenopausal at the time of diagnosis but is now convincingly postmenopausal  SOCIAL HISTORY: She works as a Doctor, general practice, part-time and on Administrator, sports.  Her husband, [name], is a Scientist, physiological  at [employer].  [name] works as a Runner, broadcasting/film/video in [place].  The patient's son [name] is  in college.  The patient attends [employer].   ADVANCED DIRECTIVES: in place  HEALTH MAINTENANCE: History  Substance Use Topics  . Smoking status: Never Smoker   . Smokeless tobacco: Never Used  . Alcohol Use: No     Colonoscopy:  PAP: UTD  Bone density: [name] 2014: osteoporosis  Lipid panel: 08/12/2011 total 204, HDL 46, LDL 136  Allergies  Allergen Reactions  . Iohexol      Code: HIVES, Desc: pt called 1 day after ct 12-31-09--stated itching, runny nose, red nose---pt taking 50mg  benadryl---pt instructed to call if symptoms change or worsen. Also instructed to do 13hr prep for future contrast studies, Onset Date: [id number]     Current Outpatient Prescriptions  Medication Sig Dispense Refill  . ARMOUR THYROID 30 MG tablet Take 60 mg by mouth daily.       . Calcium Carbonate-Vitamin D (CALCIUM-VITAMIN D) 500-200 MG-UNIT per tablet Take 1 tablet by mouth 2 (two) times daily with a meal.      . cholecalciferol (VITAMIN D) 1000 UNITS tablet Take by mouth daily. Alternates dose of 7000 units and 54098 units every other day      . folic acid (FOLVITE) 1 MG tablet Take 30 mg by mouth daily.      Marland Kitchen ibuprofen (ADVIL,MOTRIN) 600 MG tablet       . letrozole (FEMARA) 2.5 MG tablet Take 1 tablet (2.5 mg total) by mouth daily.  30 tablet  11  . milk thistle 175 MG tablet Take 175 mg by mouth daily.      . MULTIPLE VITAMIN PO Take 1 tablet by mouth.      . Probiotic Product (PROBIOTIC DAILY PO) Take 1 tablet by mouth.       No current facility-administered medications for this visit.    OBJECTIVE: Middle aged white woman in no acute distress Filed Vitals:   12/10/12 1439  BP: 128/72  Pulse: 83  Temp: 98.4 F (36.9 C)  Resp: 20     Body mass index is 19.6 kg/(m^2).    ECOG FS: 0 Filed Weights   12/10/12 1439  Weight: 107 lb 3.2 oz (48.626 kg)   Sclerae unicteric Oropharynx clear No  cervical or supraclavicular adenopathy Lungs clear to auscultation bilaterally, no rales or rhonchi Heart regular rate and rhythm Abdomen  soft, nontender to palpation, positive bowel sounds MSK no focal spinal tenderness to vigorous palpation,including specifically the mid thoracic spine  no peripheral edema;  Neuro: nonfocal, well oriented, positive affect Breasts: Right breast is unremarkable. The left breast is status post mastectomy. There is no evidence of local recurrence.  the skin over this area is very sensitive. Axillae are benign bilaterally   LAB RESULTS:   Lab Results  Component Value Date   WBC 6.3 12/03/2012   NEUTROABS 4.6 12/03/2012   HGB 13.2 12/03/2012   HCT 37.5 12/03/2012   MCV 86.6 12/03/2012   PLT 244 12/03/2012  Chemistry      Component Value Date/Time   NA 134* 12/03/2012 1419   NA 140 03/23/2012 1557   K 4.0 12/03/2012 1419   K 3.7 03/23/2012 1557   CL 98 12/03/2012 1419   CL 102 03/23/2012 1557   CO2 25 12/03/2012 1419   CO2 28 03/23/2012 1557   BUN 14.7 12/03/2012 1419   BUN 12 03/23/2012 1557   CREATININE 0.8 12/03/2012 1419   CREATININE 0.79 03/23/2012 1557      Component Value Date/Time   CALCIUM 10.1 12/03/2012 1419   CALCIUM 10.1 03/23/2012 1557   CALCIUM 10.1 08/01/2011 1415   ALKPHOS 86 12/03/2012 1419   ALKPHOS 93 03/23/2012 1557   AST 24 12/03/2012 1419   AST 33 03/23/2012 1557   ALT 24 12/03/2012 1419   ALT 33 03/23/2012 1557   BILITOT 0.46 12/03/2012 1419   BILITOT 0.5 03/23/2012 1557      Lab Results  Component Value Date   LABCA2 31 12/03/2012  .   STUDIES: Mammogram on 02/24/2012 was unremarkable with the exception of "extremely dense" breast tissue.  [facility] shows a T score of -2.6 in the lumbar spine, -2.3/2.4 at the femoral necks.    ASSESSMENT: 57 y.o.  BRCA 1-2 negative [facility] woman  (1) status post left mastectomy and sentinel lymph node sampling in July 2011 for a  T1 N1 M1, stage IV invasive lobular breast cancer, grade 1, strongly estrogen and progesterone receptor-positive, HER2 negative with MIB-1 of 9% and no HER2 amplification,  with multiple sclerotic bone lesions at presentation seen only on CT scan (not on bone scan or PET scan), but  with biopsy-proven metastatic disease to bone; with an elevated CA 27.29 at presentation, and an Oncotype recurrence score of 4, predicting a good response to antiestrogens. She received  a) tamoxifen with evidence of response but poor tolerance  b) letrozole starting August 2012  (2) single functioning kidney  PLAN:  We went over Breean's situation in detail for most of today's 45 minute appointment. She is considering bilateral stopping the oophorectomy, and so she was found to be BRCA negative nevertheless she has a strong family history of breast cancer and I would support that decision. She does not want to have her uterus removed and of course that's fine. She has been set up for colonoscopy under [facility] in August. Finally we discussed mammography versus thermography versus other alternatives.  Unfortunately of we cannot get her an MRI on that she has a mammogram. This is for legal an insurance reasons but did better clinical reason is that it is very hard to interpret an MRI in the absence of the better established mammogram. She really would prefer not to do the mammogram but she is going to think) about it. Both these are scheduled for September.  We discussed followup issues. She understands the CA 27-29 in itself can be misleading. We have not done CT scans for 2 years of because of concerns regarding radiation, but I think at this point it would be prudent to do a CT of the chest and since she has been having a little bit more discomfort in the mid back a week should ask them to place special attention to the thoracic spine. She wants to do this shortly before her October visit here.  Keidra knows to call for  any problems that may develop before then.   Ingeborg Fite C    12/10/2012

## 2012-12-11 ENCOUNTER — Telehealth: Payer: Self-pay | Admitting: Oncology

## 2012-12-11 NOTE — Addendum Note (Signed)
Addended by: Billey Co on: 12/11/2012 05:37 PM   Modules accepted: Orders, Medications

## 2013-01-15 NOTE — H&P (Signed)
Sonia Hill  DICTATION # [id number] CSN# [id number]   Meriel Pica, MD 01/15/2013 12:12 PM

## 2013-01-17 ENCOUNTER — Encounter (HOSPITAL_COMMUNITY): Payer: Self-pay | Admitting: Pharmacist

## 2013-01-21 ENCOUNTER — Encounter (HOSPITAL_COMMUNITY): Payer: Self-pay | Admitting: Pharmacy Technician

## 2013-01-21 NOTE — H&P (Signed)
Sonia Hill, Sonia Hill                ACCOUNT NO.:  [id number]  MEDICAL RECORD NO.:  [id number]  LOCATION:                                 FACILITY:  PHYSICIAN:  Duke Salvia. Marcelle Overlie, M.D.DATE OF BIRTH:  28-May-1956  DATE OF ADMISSION:  01/24/2013 DATE OF DISCHARGE:                             HISTORY & PHYSICAL   CHIEF COMPLAINT:  For a laparoscopic BSO, history of breast cancer.  HISTORY OF PRESENT ILLNESS:  A 57 year old, G1, P1.  The patient has a history of left mastectomy with stage IV metastatic breast cancer currently under the care of Dr. Ruthann Cancer.  She is currently on letrozole treatments and her treatment has been stable.  Although, she is BRCA negative.  She has thought a long time about BSO possibly as her weight will reduce her risk of other neoplasia, namely ovarian cancer. She discusses with Dr. Darnelle Catalan recently who has supported her in this decision and we reviewed it extensively.  The procedure for a laparoscopic BSO including specific risks related to bleeding, infection, adjacent organ injury reviewed.  The possible need to complete the surgery by open technique discussed with her.  She has had a previous ureteral reimplantation and only has 1 functioning kidney that was done through a Pfannenstiel incision by Dr. Wanda Plump years ago.  Part of our discussion also included the possibility of hysterectomy at the same time which she has declined.  Her last ultrasound here in our office 10/29/2012, demonstrated a very small 15 x 11 mm subserosal fibroid.  No other abnormalities noted.  Last Pap was 05/14, which was likewise normal.  Review of her surgical records reveals that she has had a sling in the past.  Again, review of Dr. Wanda Plump notes reveal that she has only a functioning right kidney.  PAST MEDICAL HISTORY:  ALLERGIES:  BETADINE and SULFA drugs.  CURRENT MEDICATIONS:  Letrozole, Armour thyroid supplements including fish oil, CoQ10, OsteoSheath  bone and mineral supplement, vitamin D3.  SURGICAL HISTORY:  She has had a kidney removed at age 47 years and also re-implantation of a ureter along with history of a sling by Dr. Sherron Monday.  REVIEW OF SYSTEMS:  Significant for her history of breast cancer, UTI, osteoporosis.  FAMILY HISTORY:  Significant for her husband with thyroid disease. Also, a family history of breast cancer.  SOCIAL HISTORY:  Denies alcohol, tobacco, or drug use.  She is married. Dr. Darnelle Catalan is her oncologist.  PHYSICAL EXAMINATION:  VITAL SIGNS:  Temp 98.2, blood pressure 120/78. HEENT:  Unremarkable. NECK:  Supple without masses. LUNGS:  Clear. CARDIOVASCULAR:  Regular rate and rhythm without murmurs, rubs, or gallops. BREASTS:  Left breast is surgically absent.  Right breast negative. ABDOMEN:  Soft, flat, and nontender. PELVIS:  Vulva, vagina, and cervix normal.  Uterus is small.  Adnexa unremarkable. EXTREMITIES:  Unremarkable. NEUROLOGIC:  Unremarkable.  IMPRESSION:  History of breast cancer, currently on letrozole.  PLAN:  Laparoscopic BSO.  She prefers conservation of her uterus.  This procedure including specific risks discussed as above.  The possible need to complete the surgery by open technique also reviewed with her.     Micky Overturf M. Marcelle Overlie, M.D.  RM[facility]/MEDQ  D:  01/15/2013  T:  01/16/2013  Job:  [id number]

## 2013-01-22 ENCOUNTER — Encounter (HOSPITAL_COMMUNITY)
Admission: RE | Admit: 2013-01-22 | Discharge: 2013-01-22 | Disposition: A | Discharge status: Home / Self Care | Payer: 59 | Source: Ambulatory Visit | Attending: Obstetrics and Gynecology | Admitting: Obstetrics and Gynecology

## 2013-01-22 ENCOUNTER — Encounter (HOSPITAL_COMMUNITY): Payer: Self-pay

## 2013-01-22 HISTORY — DX: Chronic kidney disease, unspecified: N18.9

## 2013-01-22 HISTORY — DX: Hypothyroidism, unspecified: E03.9

## 2013-01-22 HISTORY — DX: Depression, unspecified: F32.A

## 2013-01-22 HISTORY — DX: Gastro-esophageal reflux disease without esophagitis: K21.9

## 2013-01-22 HISTORY — DX: Other specified postprocedural states: Z98.890

## 2013-01-22 HISTORY — DX: Major depressive disorder, single episode, unspecified: F32.9

## 2013-01-22 HISTORY — DX: Hyperlipidemia, unspecified: E78.5

## 2013-01-22 HISTORY — DX: Other specified postprocedural states: R11.2

## 2013-01-22 HISTORY — DX: Unspecified osteoarthritis, unspecified site: M19.90

## 2013-01-22 LAB — CBC
HCT: 36.9 % (ref 36.0–46.0)
MCV: 86.8 fL (ref 78.0–100.0)
RBC: 4.25 MIL/uL (ref 3.87–5.11)
WBC: 6.8 10*3/uL (ref 4.0–10.5)

## 2013-01-22 NOTE — Patient Instructions (Addendum)
   Your procedure is scheduled on: Thursday, August 14  Enter through the [employer] [facility] at: 6 am Pick up the phone at the desk and dial [phone] and inform us of your arrival.  Please call this number if you have any problems the morning of surgery: [phone]  Remember: Do not eat or drink after midnight: Wednesday Take these medicines the morning of surgery with a SIP OF WATER:  Armour  Do not wear jewelry, make-up, or FINGER nail polish No metal in your hair or on your body. Do not wear lotions, powders, perfumes. You may wear deodorant.  Please use your CHG wash as directed prior to surgery.  Do not shave anywhere for at least 12 hours prior to first CHG shower.  Do not bring valuables to the hospital. Contacts, dentures or bridgework may not be worn into surgery.  Patients discharged on the day of surgery will not be allowed to drive home.  Home with Husband [name].

## 2013-01-23 MED ORDER — LACTATED RINGERS IV SOLN: INTRAVENOUS | Status: DC

## 2013-01-24 ENCOUNTER — Encounter (HOSPITAL_COMMUNITY)
Admission: RE | Disposition: A | Discharge status: Home / Self Care | Payer: Self-pay | Source: Ambulatory Visit | Attending: Obstetrics and Gynecology

## 2013-01-24 ENCOUNTER — Ambulatory Visit (HOSPITAL_COMMUNITY)
Admission: RE | Admit: 2013-01-24 | Discharge: 2013-01-24 | Disposition: A | Discharge status: Home / Self Care | Payer: 59 | Source: Ambulatory Visit | Attending: Obstetrics and Gynecology | Admitting: Obstetrics and Gynecology

## 2013-01-24 ENCOUNTER — Encounter (HOSPITAL_COMMUNITY): Payer: Self-pay | Admitting: Anesthesiology

## 2013-01-24 ENCOUNTER — Ambulatory Visit (HOSPITAL_COMMUNITY): Payer: 59 | Admitting: Anesthesiology

## 2013-01-24 DIAGNOSIS — D252 Subserosal leiomyoma of uterus: Secondary | ICD-10-CM | POA: Insufficient documentation

## 2013-01-24 DIAGNOSIS — C50919 Malignant neoplasm of unspecified site of unspecified female breast: Secondary | ICD-10-CM | POA: Insufficient documentation

## 2013-01-24 DIAGNOSIS — N882 Stricture and stenosis of cervix uteri: Secondary | ICD-10-CM | POA: Insufficient documentation

## 2013-01-24 DIAGNOSIS — Z4002 Encounter for prophylactic removal of ovary: Secondary | ICD-10-CM | POA: Insufficient documentation

## 2013-01-24 DIAGNOSIS — Z171 Estrogen receptor negative status [ER-]: Secondary | ICD-10-CM | POA: Insufficient documentation

## 2013-01-24 HISTORY — PX: LAPAROSCOPY: SHX197

## 2013-01-24 HISTORY — PX: SALPINGOOPHORECTOMY: SHX82

## 2013-01-24 LAB — CBC
MCH: 29.9 pg (ref 26.0–34.0)
Platelets: 238 10*3/uL (ref 150–400)
RBC: 4.28 MIL/uL (ref 3.87–5.11)
WBC: 15.3 10*3/uL — ABNORMAL HIGH (ref 4.0–10.5)

## 2013-01-24 SURGERY — LAPAROSCOPY OPERATIVE
Anesthesia: General | Site: Abdomen | Wound class: Clean Contaminated

## 2013-01-24 MED ORDER — FENTANYL CITRATE 0.05 MG/ML IJ SOLN
INTRAMUSCULAR | Status: AC
  Administered 2013-01-24: 50 ug via INTRAVENOUS
  Filled 2013-01-24: qty 2

## 2013-01-24 MED ORDER — LIDOCAINE HCL (CARDIAC) 20 MG/ML IV SOLN
INTRAVENOUS | Status: AC
  Filled 2013-01-24: qty 5

## 2013-01-24 MED ORDER — SODIUM CHLORIDE 0.9 % IJ SOLN
INTRAMUSCULAR | Status: DC | PRN
  Administered 2013-01-24: 10 mL

## 2013-01-24 MED ORDER — LACTATED RINGERS IV SOLN
INTRAVENOUS | Status: DC
  Administered 2013-01-24 (×2): via INTRAVENOUS

## 2013-01-24 MED ORDER — GLYCOPYRROLATE 0.2 MG/ML IJ SOLN
INTRAMUSCULAR | Status: DC | PRN
  Administered 2013-01-24: .5 mg via INTRAVENOUS

## 2013-01-24 MED ORDER — ONDANSETRON HCL 4 MG/2ML IJ SOLN
INTRAMUSCULAR | Status: AC
  Filled 2013-01-24: qty 2

## 2013-01-24 MED ORDER — PROPOFOL 10 MG/ML IV EMUL
INTRAVENOUS | Status: AC
  Filled 2013-01-24: qty 20

## 2013-01-24 MED ORDER — INDIGOTINDISULFONATE SODIUM 8 MG/ML IJ SOLN
INTRAMUSCULAR | Status: AC
  Filled 2013-01-24: qty 5

## 2013-01-24 MED ORDER — MIDAZOLAM HCL 5 MG/5ML IJ SOLN
INTRAMUSCULAR | Status: DC | PRN
  Administered 2013-01-24: 1 mg via INTRAVENOUS

## 2013-01-24 MED ORDER — PHENYLEPHRINE 40 MCG/ML (10ML) SYRINGE FOR IV PUSH (FOR BLOOD PRESSURE SUPPORT)
PREFILLED_SYRINGE | INTRAVENOUS | Status: AC
  Filled 2013-01-24: qty 5

## 2013-01-24 MED ORDER — KETOROLAC TROMETHAMINE 30 MG/ML IJ SOLN
INTRAMUSCULAR | Status: AC
  Filled 2013-01-24: qty 1

## 2013-01-24 MED ORDER — NALOXONE HCL 0.4 MG/ML IJ SOLN
INTRAMUSCULAR | Status: AC
  Filled 2013-01-24: qty 1

## 2013-01-24 MED ORDER — CEFAZOLIN SODIUM-DEXTROSE 2-3 GM-% IV SOLR
2.0000 g | INTRAVENOUS | Status: AC
  Administered 2013-01-24: 2 g via INTRAVENOUS

## 2013-01-24 MED ORDER — ROCURONIUM BROMIDE 50 MG/5ML IV SOLN
INTRAVENOUS | Status: AC
  Filled 2013-01-24: qty 1

## 2013-01-24 MED ORDER — BUPIVACAINE HCL (PF) 0.25 % IJ SOLN
INTRAMUSCULAR | Status: AC
  Filled 2013-01-24: qty 30

## 2013-01-24 MED ORDER — FENTANYL CITRATE 0.05 MG/ML IJ SOLN: 25.0000 ug | INTRAMUSCULAR | Status: DC | PRN

## 2013-01-24 MED ORDER — DEXAMETHASONE SODIUM PHOSPHATE 10 MG/ML IJ SOLN
INTRAMUSCULAR | Status: AC
  Filled 2013-01-24: qty 1

## 2013-01-24 MED ORDER — NALOXONE HCL 0.4 MG/ML IJ SOLN
INTRAMUSCULAR | Status: DC | PRN
  Administered 2013-01-24: 0.1 mg via INTRAVENOUS

## 2013-01-24 MED ORDER — LIDOCAINE HCL (CARDIAC) 20 MG/ML IV SOLN
INTRAVENOUS | Status: DC | PRN
  Administered 2013-01-24: 70 mg via INTRAVENOUS
  Administered 2013-01-24: 20 mg via INTRAVENOUS

## 2013-01-24 MED ORDER — ROCURONIUM BROMIDE 100 MG/10ML IV SOLN
INTRAVENOUS | Status: DC | PRN
  Administered 2013-01-24: 25 mg via INTRAVENOUS

## 2013-01-24 MED ORDER — NEOSTIGMINE METHYLSULFATE 1 MG/ML IJ SOLN
INTRAMUSCULAR | Status: AC
  Filled 2013-01-24: qty 1

## 2013-01-24 MED ORDER — PROPOFOL 10 MG/ML IV BOLUS
INTRAVENOUS | Status: DC | PRN
  Administered 2013-01-24: 200 mg via INTRAVENOUS

## 2013-01-24 MED ORDER — INDIGOTINDISULFONATE SODIUM 8 MG/ML IJ SOLN
INTRAMUSCULAR | Status: DC | PRN
  Administered 2013-01-24: 40 mg via INTRAVENOUS

## 2013-01-24 MED ORDER — PHENYLEPHRINE HCL 10 MG/ML IJ SOLN
INTRAMUSCULAR | Status: DC | PRN
  Administered 2013-01-24: 80 ug via INTRAVENOUS

## 2013-01-24 MED ORDER — FENTANYL CITRATE 0.05 MG/ML IJ SOLN
INTRAMUSCULAR | Status: DC | PRN
  Administered 2013-01-24 (×2): 50 ug via INTRAVENOUS
  Administered 2013-01-24: 100 ug via INTRAVENOUS
  Administered 2013-01-24: 50 ug via INTRAVENOUS

## 2013-01-24 MED ORDER — HYDROCODONE-IBUPROFEN 7.5-200 MG PO TABS: 1.0000 | ORAL_TABLET | Freq: Three times a day (TID) | ORAL | Status: DC | PRN

## 2013-01-24 MED ORDER — CEFAZOLIN SODIUM-DEXTROSE 2-3 GM-% IV SOLR
INTRAVENOUS | Status: AC
  Filled 2013-01-24: qty 50

## 2013-01-24 MED ORDER — KETOROLAC TROMETHAMINE 30 MG/ML IJ SOLN
INTRAMUSCULAR | Status: DC | PRN
  Administered 2013-01-24: 30 mg via INTRAVENOUS

## 2013-01-24 MED ORDER — FLUMAZENIL 0.5 MG/5ML IV SOLN
INTRAVENOUS | Status: DC | PRN
  Administered 2013-01-24: 0.2 mg via INTRAVENOUS

## 2013-01-24 MED ORDER — ONDANSETRON HCL 4 MG/2ML IJ SOLN
INTRAMUSCULAR | Status: DC | PRN
  Administered 2013-01-24: 4 mg via INTRAVENOUS

## 2013-01-24 MED ORDER — NEOSTIGMINE METHYLSULFATE 1 MG/ML IJ SOLN
INTRAMUSCULAR | Status: DC | PRN
  Administered 2013-01-24: 2 mg via INTRAVENOUS

## 2013-01-24 MED ORDER — FENTANYL CITRATE 0.05 MG/ML IJ SOLN
INTRAMUSCULAR | Status: AC
  Filled 2013-01-24: qty 5

## 2013-01-24 MED ORDER — SCOPOLAMINE 1 MG/3DAYS TD PT72
1.0000 | MEDICATED_PATCH | TRANSDERMAL | Status: DC
  Administered 2013-01-24: 1.5 mg via TRANSDERMAL

## 2013-01-24 MED ORDER — DEXAMETHASONE SODIUM PHOSPHATE 10 MG/ML IJ SOLN
INTRAMUSCULAR | Status: DC | PRN
  Administered 2013-01-24: 4 mg via INTRAVENOUS

## 2013-01-24 MED ORDER — METOCLOPRAMIDE HCL 5 MG/ML IJ SOLN: 10.0000 mg | Freq: Once | INTRAMUSCULAR | Status: DC | PRN

## 2013-01-24 MED ORDER — FLUMAZENIL 1 MG/10ML IV SOLN
INTRAVENOUS | Status: AC
  Filled 2013-01-24: qty 10

## 2013-01-24 MED ORDER — GLYCOPYRROLATE 0.2 MG/ML IJ SOLN
INTRAMUSCULAR | Status: AC
  Filled 2013-01-24: qty 3

## 2013-01-24 MED ORDER — MIDAZOLAM HCL 2 MG/2ML IJ SOLN
INTRAMUSCULAR | Status: AC
  Filled 2013-01-24: qty 2

## 2013-01-24 MED ORDER — MEPERIDINE HCL 25 MG/ML IJ SOLN: 6.2500 mg | INTRAMUSCULAR | Status: DC | PRN

## 2013-01-24 MED ORDER — BUPIVACAINE HCL (PF) 0.25 % IJ SOLN
INTRAMUSCULAR | Status: DC | PRN
  Administered 2013-01-24: 8 mL
  Administered 2013-01-24: 3 mL

## 2013-01-24 SURGICAL SUPPLY — 22 items
CATH FOLEY 2WAY SLVR  5CC 14FR (CATHETERS) ×1
CATH FOLEY 2WAY SLVR 5CC 14FR (CATHETERS) ×2 IMPLANT
CATH ROBINSON RED A/P 16FR (CATHETERS) ×6 IMPLANT
CLOTH BEACON ORANGE TIMEOUT ST (SAFETY) ×3 IMPLANT
DERMABOND ADVANCED (GAUZE/BANDAGES/DRESSINGS) ×1
DERMABOND ADVANCED .7 DNX12 (GAUZE/BANDAGES/DRESSINGS) ×2 IMPLANT
DRSG COVADERM PLUS 2X2 (GAUZE/BANDAGES/DRESSINGS) ×6 IMPLANT
GLOVE BIO SURGEON STRL SZ7 (GLOVE) ×3 IMPLANT
GOWN PREVENTION PLUS LG XLONG (DISPOSABLE) ×6 IMPLANT
NEEDLE INSUFFLATION 120MM (ENDOMECHANICALS) ×3 IMPLANT
NS IRRIG 1000ML POUR BTL (IV SOLUTION) ×3 IMPLANT
PACK LAPAROSCOPY BASIN (CUSTOM PROCEDURE TRAY) ×3 IMPLANT
PROTECTOR NERVE ULNAR (MISCELLANEOUS) ×3 IMPLANT
SEALER TISSUE G2 CVD JAW 45CM (ENDOMECHANICALS) ×3 IMPLANT
SPONGE GAUZE 2X2 8PLY STRL LF (GAUZE/BANDAGES/DRESSINGS) ×6 IMPLANT
SUT VICRYL 0 UR6 27IN ABS (SUTURE) ×6 IMPLANT
SUT VICRYL RAPIDE 4/0 PS 2 (SUTURE) ×6 IMPLANT
TOWEL OR 17X24 6PK STRL BLUE (TOWEL DISPOSABLE) ×6 IMPLANT
TROCAR OPTI TIP 5M 100M (ENDOMECHANICALS) ×6 IMPLANT
TROCAR XCEL DIL TIP R 11M (ENDOMECHANICALS) ×3 IMPLANT
WARMER LAPAROSCOPE (MISCELLANEOUS) ×3 IMPLANT
WATER STERILE IRR 1000ML POUR (IV SOLUTION) ×3 IMPLANT

## 2013-01-24 NOTE — Anesthesia Preprocedure Evaluation (Addendum)
Anesthesia Evaluation  Patient identified by MRN, date of birth, ID band Patient awake    Reviewed: Allergy & Precautions, H&P , NPO status , Patient's Chart, lab work & pertinent test results  History of Anesthesia Complications (+) PONV  Airway Mallampati: II TM Distance: >3 FB Neck ROM: Full    Dental no notable dental hx. (+) Teeth Intact   Pulmonary neg pulmonary ROS,  breath sounds clear to auscultation  Pulmonary exam normal       Cardiovascular negative cardio ROS  Rhythm:Regular Rate:Normal     Neuro/Psych PSYCHIATRIC DISORDERS Depression negative neurological ROS     GI/Hepatic GERD-  Medicated and Controlled,  Endo/Other  Hypothyroidism Breast Ca with mets to bone Hyperlipidemia  Renal/GU Renal disease  negative genitourinary   Musculoskeletal  (+) Arthritis -, Osteoarthritis,    Abdominal   Peds  Hematology negative hematology ROS (+)   Anesthesia Other Findings   Reproductive/Obstetrics negative OB ROS                         Anesthesia Physical Anesthesia Plan  ASA: II  Anesthesia Plan: General   Post-op Pain Management:    Induction: Intravenous  Airway Management Planned: Oral ETT  Additional Equipment:   Intra-op Plan:   Post-operative Plan: Extubation in OR  Informed Consent: I have reviewed the patients History and Physical, chart, labs and discussed the procedure including the risks, benefits and alternatives for the proposed anesthesia with the patient or authorized representative who has indicated his/her understanding and acceptance.   Dental advisory given  Plan Discussed with: Anesthesiologist, CRNA and Surgeon  Anesthesia Plan Comments:         Anesthesia Quick Evaluation

## 2013-01-24 NOTE — Anesthesia Postprocedure Evaluation (Signed)
  Anesthesia Post Note  Patient: Sonia Hill  Procedure(s) Performed: Procedure(s) (LRB): LAPROSCOPY OPERATIVE (N/A) SALPINGO OOPHORECTOMY (Bilateral)  Anesthesia type: GA  Patient location: PACU  Post pain: Pain level controlled  Post assessment: Post-op Vital signs reviewed  Last Vitals:  Filed Vitals:   01/24/13 0915  BP: 119/61  Pulse: 69  Temp:   Resp: 24    Post vital signs: Reviewed  Level of consciousness: sedated  Complications: No apparent anesthesia complications

## 2013-01-24 NOTE — Transfer of Care (Signed)
Immediate Anesthesia Transfer of Care Note  Patient: Sonia Hill  Procedure(s) Performed: Procedure(s): LAPROSCOPY OPERATIVE (N/A) SALPINGO OOPHORECTOMY (Bilateral)  Patient Location: PACU  Anesthesia Type:General  Level of Consciousness: awake, oriented and patient cooperative  Airway & Oxygen Therapy: Patient Spontanous Breathing and Patient connected to nasal cannula oxygen  Post-op Assessment: Report given to PACU RN and Post -op Vital signs reviewed and stable  Post vital signs: Reviewed and stable  Complications: No apparent anesthesia complications

## 2013-01-24 NOTE — Op Note (Signed)
Preoperative diagnosis: History of breast cancer  Postoperative diagnosis: Same  Procedure: Laparoscopy, with laparoscopic BSO  Surgeon: Marcelle Overlie  Anesthesia: Gen.  Specimens removed: Bilateral tubes and ovaries, to pathology  Complications: None  Drains: Foley catheter  Procedure and findings:  The patient was taken the operating room after an adequate level of general anesthesia was obtained, appropriate timeout for taken. The abdomen and vagina were prepped and draped in usual fashion for laparoscopy. Due to severe cervical stenosis, Hulka tenaculum was positioned. Foley catheter was positioned draining clear urine. Attention directed to the abdomen where the subumbilical area was infiltrated with quarter percent Marcaine plain, small incision was made in the varies needle was introduced without difficulty. Its intra-abdominal position was verified by pressure water testing. After 2 L pneumoperitoneum syncopated lap scopic trocar and sleeve were then introduced without difficulty. No evidence of any bleeding or trauma. 3 finger breaths above the symphysis in the midline a 5 mm trocar was inserted under direct visualization. The patient was then placed in Trendelenburg and the pelvic findings as follows:  Upper abdomen unremarkable the uterus was small there was a solitary 1 cm serosal fibroid at the fundus some minimal adhesions at the distal left fimbria otherwise normal adnexa cul-de-sac free and clear. Starting on the right the right tube and ovary were placed on traction toward the midline with an atraumatic grasper. The course of the ureter on that side was noted to be well below, the in seal device was then used to coagulated and divided the tube and every close to the origin to the level of the uterus. This was then removed and held temporarily. This is hemostatic on the opposite side, left tube and ovary were placed on traction, she has only one functioning kidney on the right. The left  tube and ovary were placed on traction and the in seal was used to coagulate and divide the left tube and ovary until was removed at the insertion at the uterine cornu. Kelly clamps in place the lower incision and these bilateral tubes and ovaries were easily removed sent to pathology. Blunt probe probe was repositioned and the operative site was inspected during this time she was given indigo carmine which showed a readily in the Foley. Both operative sites were hemostatic as was removed gas less escape the lower fascial defect was closed with a 2-0 Vicryl suture, 4-0 Vicryl repeat subcuticular on the skin at each site. She tolerated this well went to recovery room in good condition.  Dictated with [facility]  Boden Stucky M. Milana Obey.D.

## 2013-01-24 NOTE — Progress Notes (Signed)
The patient was re-examined with no change in status 

## 2013-01-25 ENCOUNTER — Encounter (HOSPITAL_COMMUNITY): Payer: Self-pay | Admitting: Obstetrics and Gynecology

## 2013-02-13 ENCOUNTER — Telehealth: Payer: Self-pay | Admitting: Oncology

## 2013-02-13 NOTE — Telephone Encounter (Signed)
Faxed pt medical records to Dr. Brien Few

## 2013-02-25 ENCOUNTER — Ambulatory Visit: Payer: 59

## 2013-02-27 ENCOUNTER — Ambulatory Visit (HOSPITAL_COMMUNITY): Payer: 59

## 2013-03-08 ENCOUNTER — Other Ambulatory Visit: Payer: Self-pay | Admitting: *Deleted

## 2013-03-08 DIAGNOSIS — C50919 Malignant neoplasm of unspecified site of unspecified female breast: Secondary | ICD-10-CM

## 2013-03-08 DIAGNOSIS — C50912 Malignant neoplasm of unspecified site of left female breast: Secondary | ICD-10-CM

## 2013-03-15 ENCOUNTER — Other Ambulatory Visit (HOSPITAL_BASED_OUTPATIENT_CLINIC_OR_DEPARTMENT_OTHER): Payer: 59 | Admitting: Lab

## 2013-03-15 ENCOUNTER — Ambulatory Visit (HOSPITAL_COMMUNITY): Payer: 59

## 2013-03-15 DIAGNOSIS — C50319 Malignant neoplasm of lower-inner quadrant of unspecified female breast: Secondary | ICD-10-CM

## 2013-03-15 DIAGNOSIS — C50912 Malignant neoplasm of unspecified site of left female breast: Secondary | ICD-10-CM

## 2013-03-15 DIAGNOSIS — C50919 Malignant neoplasm of unspecified site of unspecified female breast: Secondary | ICD-10-CM

## 2013-03-15 DIAGNOSIS — C7951 Secondary malignant neoplasm of bone: Secondary | ICD-10-CM

## 2013-03-15 LAB — CBC WITH DIFFERENTIAL/PLATELET
BASO%: 1.1 % (ref 0.0–2.0)
MCHC: 34.3 g/dL (ref 31.5–36.0)
MONO#: 0.3 10*3/uL (ref 0.1–0.9)
RBC: 4.45 10*6/uL (ref 3.70–5.45)
WBC: 4.9 10*3/uL (ref 3.9–10.3)
lymph#: 1 10*3/uL (ref 0.9–3.3)

## 2013-03-15 LAB — COMPREHENSIVE METABOLIC PANEL (CC13)
ALT: 23 U/L (ref 0–55)
CO2: 26 mEq/L (ref 22–29)
Calcium: 9.8 mg/dL (ref 8.4–10.4)
Chloride: 102 mEq/L (ref 98–109)
Sodium: 137 mEq/L (ref 136–145)
Total Bilirubin: 0.49 mg/dL (ref 0.20–1.20)
Total Protein: 7.2 g/dL (ref 6.4–8.3)

## 2013-03-15 LAB — CANCER ANTIGEN 27.29: CA 27.29: 31 U/mL (ref 0–39)

## 2013-03-22 ENCOUNTER — Telehealth: Payer: Self-pay | Admitting: Oncology

## 2013-03-22 ENCOUNTER — Ambulatory Visit (HOSPITAL_BASED_OUTPATIENT_CLINIC_OR_DEPARTMENT_OTHER): Payer: 59 | Admitting: Oncology

## 2013-03-22 VITALS — BP 125/76 | HR 80 | Temp 98.4°F | Resp 19 | Ht 61.0 in | Wt 104.3 lb

## 2013-03-22 DIAGNOSIS — M810 Age-related osteoporosis without current pathological fracture: Secondary | ICD-10-CM

## 2013-03-22 DIAGNOSIS — C7951 Secondary malignant neoplasm of bone: Secondary | ICD-10-CM

## 2013-03-22 DIAGNOSIS — Z170 Estrogen receptor positive status [ER+]: Secondary | ICD-10-CM

## 2013-03-22 DIAGNOSIS — IMO0002 Reserved for concepts with insufficient information to code with codable children: Secondary | ICD-10-CM

## 2013-03-22 DIAGNOSIS — C50919 Malignant neoplasm of unspecified site of unspecified female breast: Secondary | ICD-10-CM

## 2013-03-22 DIAGNOSIS — C50319 Malignant neoplasm of lower-inner quadrant of unspecified female breast: Secondary | ICD-10-CM

## 2013-03-22 DIAGNOSIS — C50312 Malignant neoplasm of lower-inner quadrant of left female breast: Secondary | ICD-10-CM | POA: Insufficient documentation

## 2013-03-22 NOTE — Progress Notes (Signed)
ID: Sonia Hill   DOB: 04-Oct-1955  MR#: [id number]  FAO#:[id number]  PCP: Angelena Sole, MD GYN: Richarda Overlie SU: Manus Rudd OTHER MD: Napoleon Form, Eloise Harman [phone])  HISTORY OF PRESENT ILLNESS: Allyssia had screening mammography on 11/10/2009 showing very dense tissue with a possible distortion in the left breast.  Digital mammography on June 10th showed a 1.5 cm cluster of microcalcifications in the lower inner portion of the breast.  On physical exam, there was no palpable mass and no palpable axillary adenopathy.  There was some nipple inversion inferolaterally.  Ultrasound showed a 2.6 cm irregular mass-like area with dense posterior acoustic shadowing in the 3 o'clock position, 1 cm from the nipple, but normal appearing left axillary lymph nodes.    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  A second mass noted on ultrasound was also lobular, also low grade.  Both were ER and PR positive at well over 90%.  Both had low proliferation markers at 9%, and both were Her-2 negative by CISH with ratios of 1.6 and 1.24.  In short, both masses were nearly identical.    On June 16th, the patient had bilateral breast MRIs which showed in the left breast at 3 o'clock an irregular area of abnormal enhancement measuring up to 2.7 cm and in the left lower inner quadrant, a second post-biopsy area measuring 1.5 cm.  The difference between these two areas was stated at the conference the morning of the visit to be approximately 5 cm.  Given this data the patient opted for Left mastectomy with sentinel lymph node sampling, performed July 2011 with results as detailed below.  INTERVAL HISTORY: Odena returns today for followup of her metastatic breast cancer. Since her last visit here she underwent bilateral salpingo-oophorectomy, with benign  pathology ([id number]). The surgery was "a Breas" and she had no problems with pain, bleeding, fever, or other complications.  REVIEW OF SYSTEMS: Bailea has significant problems with vaginal dryness. Also her skin is dry. She is having dyspareunia. She had continued to lose weight because of her very strict diet. Dr. Lyla Son suggested she go back to eating some meat and she is now eating organic she can and some 1. Her weight has stabilized, although she has not yet gained. Anushree had been scheduled to have some scans prior to today's visit, but she tells me Dr. Lyla Son and suggested we postponed those until she is a little bit stronger. In the meantime she is undergoing extensive detoxification and chelation therapy. She continues to work part-time. She has occasional headaches which are not more frequent or intense than before. She bruises easily. She has some seasonal allergies. A detailed review of systems today was otherwise noncontributory  PAST MEDICAL HISTORY: Past Medical History  Diagnosis Date  . Cancer     lt. breast ca  . SVD (spontaneous vaginal delivery)     x 1  . PONV (postoperative nausea and vomiting)   . Hypothyroidism   . Hyperlipidemia     diet controlled  . Depression     Hx - no current problem  . Chronic kidney disease     Only has right kidney  . GERD (gastroesophageal reflux disease)     diet controlled - no meds  . Arthritis     neck, upper back, shoulders - no meds - yoga  1. Osteopenia.  2. Congenital left renal atrophy.  3. History of recurrent UTIs. 4. History of bladder reconstruction for reflux more than 18 years ago. 5. History of depression and anxiety. 6. History of hypothyroidism.  7. History of "sling procedure".  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    FAMILY HISTORY The patient's father is alive in his early 54s.  He has Alzheimer's disease. The patient's mother was diagnosed  with breast cancer at the age of 58.  She died at the age of 35.  The patient's father had one out of two sisters with breast cancer.  There are other breast cancers on the mother's side.  The patient has one brother and one younger sister, neither with cancer.    GYNECOLOGIC HISTORY: She is GX P1.  That pregnancy was at age 25.  Menarche was at age 57.  She was perimenopausal at the time of diagnosis but is now convincingly postmenopausal  SOCIAL HISTORY: She works as a Doctor, general practice, part-time and on Administrator, sports.  Her husband, [name], is a Scientist, physiological at [employer].  [name] works as a Runner, broadcasting/film/video in [place].  The patient's son [name] is  in college.  The patient attends [employer].   ADVANCED DIRECTIVES: in place  HEALTH MAINTENANCE: History  Substance Use Topics  . Smoking status: Never Smoker   . Smokeless tobacco: Never Used  . Alcohol Use: No     Colonoscopy:  PAP: UTD  Bone density: [name] 2014: osteoporosis  Lipid panel: 08/12/2011 total 204, HDL 46, LDL 136  Allergies  Allergen Reactions  . Iohexol      Code: HIVES, Desc: pt called 1 day after ct 12-31-09--stated itching, runny nose, red nose---pt taking 50mg  benadryl---pt instructed to call if symptoms change or worsen. Also instructed to do 13hr prep for future contrast studies, Onset Date: [id number]   . Adhesive [Tape] Rash    Ok to use paper tape and tegaderm over IV site  . Betadine [Povidone Iodine] Rash  . Mercury Rash  . Sulfa Antibiotics Rash    Current Outpatient Prescriptions  Medication Sig Dispense Refill  . Acetylcysteine (NAC) 600 MG CAPS Take 1 capsule by mouth daily.      Marland Kitchen ascorbic acid (VITAMIN C) 1000 MG tablet Take 1,000 mg by mouth 2 (two) times daily.      Marland Kitchen BIOTIN FORTE PO Take 1 tablet by mouth daily.      . Calcium-Vitamin D-Vitamin K (CALCIUM SOFT CHEWS PO) Take 1 each by mouth 3 (three) times daily.      . Cholecalciferol (VITAMIN D-3) 5000 UNITS TABS Take 1 tablet by  mouth daily.      . Coenzyme Q10 (CO Q-10) 200 MG CAPS Take 1 capsule by mouth daily.      Marland Kitchen FOLIC ACID PO Take 1 tablet by mouth daily.      Chilton Si Tea, Camillia sinensis, (GREEN TEA EXTRACT PO) Take 1 tablet by mouth daily.      Marland Kitchen HYDROcodone-ibuprofen (VICOPROFEN) 7.5-200 MG per tablet Take 1 tablet by mouth every 8 (eight) hours as needed for pain.  30 tablet  0  . letrozole (FEMARA) 2.5 MG tablet Take 1 tablet (2.5 mg total) by mouth daily.  30 tablet  11  . MAGNESIUM PO Take 1 tablet by mouth daily as needed (leg cramps).      . MULTIPLE VITAMIN PO Take 1 tablet by mouth daily.       Marland Kitchen  niacin 250 MG tablet Take 250 mg by mouth at bedtime.      . OMEGA 3 1200 MG CAPS Take 1 capsule by mouth 2 (two) times daily.      Marland Kitchen OVER THE COUNTER MEDICATION Take 1 tablet by mouth daily. Iodoral Iodine 12.5mg  supplement      . Probiotic Product (PROBIOTIC DAILY PO) Take 1 tablet by mouth daily.       Marland Kitchen thyroid (ARMOUR) 60 MG tablet Take 60 mg by mouth daily before breakfast.       No current facility-administered medications for this visit.    OBJECTIVE: Middle aged white woman who appears well Filed Vitals:   03/22/13 0923  BP: 125/76  Pulse: 80  Temp: 98.4 F (36.9 C)  Resp: 19     Body mass index is 19.72 kg/(m^2).    ECOG FS: 0 Filed Weights   03/22/13 0923  Weight: 104 lb 4.8 oz (47.31 kg)   Sclerae unicteric Oropharynx clear No cervical or supraclavicular adenopathy Lungs clear to auscultation with good excursion bilaterally Heart regular rate and rhythm Abdomen  soft, nontender, positive bowel sounds MSK no focal spinal tenderness to vigorous palpation, no upper extremity lymphedema  Neuro: nonfocal, well oriented, anxious affect Breasts: The right breast is unremarkable. The left breast is status post mastectomy. There is no evidence of local recurrence. The left axilla is benign  LAB RESULTS:   Lab Results  Component Value Date   WBC 4.9 03/15/2013   NEUTROABS 3.6  03/15/2013   HGB 13.5 03/15/2013   HCT 39.3 03/15/2013   MCV 88.4 03/15/2013   PLT 288 03/15/2013      Chemistry      Component Value Date/Time   NA 137 03/15/2013 0919   NA 140 03/23/2012 1557   K 3.9 03/15/2013 0919   K 3.7 03/23/2012 1557   CL 98 12/03/2012 1419   CL 102 03/23/2012 1557   CO2 26 03/15/2013 0919   CO2 28 03/23/2012 1557   BUN 12.7 03/15/2013 0919   BUN 12 03/23/2012 1557   CREATININE 0.8 03/15/2013 0919   CREATININE 0.79 03/23/2012 1557      Component Value Date/Time   CALCIUM 9.8 03/15/2013 0919   CALCIUM 10.1 03/23/2012 1557   CALCIUM 10.1 08/01/2011 1415   ALKPHOS 109 03/15/2013 0919   ALKPHOS 93 03/23/2012 1557   AST 24 03/15/2013 0919   AST 33 03/23/2012 1557   ALT 23 03/15/2013 0919   ALT 33 03/23/2012 1557   BILITOT 0.49 03/15/2013 0919   BILITOT 0.5 03/23/2012 1557      Lab Results  Component Value Date   LABCA2 31 03/15/2013  .   STUDIES: Mammogram on 02/24/2012 was unremarkable with the exception of "extremely dense" breast tissue.  [facility] shows a T score of -2.6 in the lumbar spine, -2.3/2.4 at the femoral necks.  ASSESSMENT: 57 y.o.  BRCA 1-2 negative [facility] woman  (1) status post left mastectomy and sentinel lymph node sampling in July 2011 for a T1 N1 M1, stage IV invasive lobular breast cancer, grade 1, strongly estrogen and progesterone receptor-positive, HER2 negative with MIB-1 of 9% and no HER2 amplification,   (2) with multiple sclerotic bone lesions at presentation seen only on CT scan (not on bone scan or PET scan), but  with biopsy-proven metastatic disease to bone; and an elevated CA 27.29 at presentation,   (3) Oncotype recurrence score of 4, predicts a good response  to antiestrogens.  (4) Systemic treatment has consisted of  a) tamoxifen with evidence of response but poor tolerance  b) letrozole starting August 2012   (5) single functioning kidney  (6) status post bilateral  salpingo-oophorectomy 01/24/2013, with benign pathology  (7) osteoporosis; the patient refuses bisphosphonate therapy  PLAN:  We spent a little bit over 45 minutes today going over [place] situation. She still is very uncomfortable with the idea of mammography, and therefore we are not able to schedule her for an MRI, which would be her choice. However she does agree to a CT of the chest and a bone scan. These had been scheduled earlier this month but she fell she was not quite ready. She feels she will be ready by the time of her next visit, which will be in March. If she decides to have mammography before that visit she will let me know, as that has not been scheduled.  She is taking strontium for her bones. She understands we have data that zoledronic acid in the setting of metastatic disease to bone prolongs survival. We do not have comparable data for strontium. I have scanned into her records the information she brought regarding the strontium intervention.  Veleda is very bothered by vaginal dryness problems. I have referred her to our physical therapy department which has developed a new program to help our patients deal with these issues. If that does not provide her with significant relief, we can give tamoxifen a second look. While on tamoxifen it would be fine for her to use Vagifem suppositories, for example. This is of course not an option on letrozole.  At this point the overall plan remains to continue letrozole until there is evidence of disease progression, at which time we will consider a different antiestrogen.   MAGRINAT,GUSTAV C    03/22/2013

## 2013-03-22 NOTE — Addendum Note (Signed)
Addended by: Billey Co on: 03/22/2013 05:27 PM   Modules accepted: Orders, Medications

## 2013-04-18 ENCOUNTER — Other Ambulatory Visit: Payer: Self-pay

## 2013-08-13 ENCOUNTER — Encounter (HOSPITAL_COMMUNITY)
Admission: RE | Admit: 2013-08-13 | Discharge: 2013-08-13 | Disposition: A | Discharge status: Home / Self Care | Payer: 59 | Source: Ambulatory Visit | Attending: Oncology | Admitting: Oncology

## 2013-08-13 ENCOUNTER — Other Ambulatory Visit: Payer: Self-pay | Admitting: *Deleted

## 2013-08-13 ENCOUNTER — Ambulatory Visit (HOSPITAL_COMMUNITY)
Admission: RE | Admit: 2013-08-13 | Discharge: 2013-08-13 | Disposition: A | Discharge status: Home / Self Care | Payer: 59 | Source: Ambulatory Visit | Attending: Oncology | Admitting: Oncology

## 2013-08-13 ENCOUNTER — Other Ambulatory Visit (HOSPITAL_COMMUNITY): Payer: 59

## 2013-08-13 ENCOUNTER — Encounter (HOSPITAL_COMMUNITY): Payer: Self-pay

## 2013-08-13 ENCOUNTER — Other Ambulatory Visit (HOSPITAL_BASED_OUTPATIENT_CLINIC_OR_DEPARTMENT_OTHER): Payer: 59

## 2013-08-13 DIAGNOSIS — C50919 Malignant neoplasm of unspecified site of unspecified female breast: Secondary | ICD-10-CM

## 2013-08-13 DIAGNOSIS — C7951 Secondary malignant neoplasm of bone: Secondary | ICD-10-CM | POA: Insufficient documentation

## 2013-08-13 DIAGNOSIS — C50312 Malignant neoplasm of lower-inner quadrant of left female breast: Secondary | ICD-10-CM

## 2013-08-13 DIAGNOSIS — C50319 Malignant neoplasm of lower-inner quadrant of unspecified female breast: Secondary | ICD-10-CM

## 2013-08-13 DIAGNOSIS — Z905 Acquired absence of kidney: Secondary | ICD-10-CM | POA: Insufficient documentation

## 2013-08-13 DIAGNOSIS — C7952 Secondary malignant neoplasm of bone marrow: Secondary | ICD-10-CM

## 2013-08-13 DIAGNOSIS — Z9010 Acquired absence of unspecified breast and nipple: Secondary | ICD-10-CM | POA: Insufficient documentation

## 2013-08-13 DIAGNOSIS — Z9221 Personal history of antineoplastic chemotherapy: Secondary | ICD-10-CM | POA: Insufficient documentation

## 2013-08-13 LAB — COMPREHENSIVE METABOLIC PANEL (CC13)
ALBUMIN: 4 g/dL (ref 3.5–5.0)
ALT: 28 U/L (ref 0–55)
ANION GAP: 8 meq/L (ref 3–11)
AST: 29 U/L (ref 5–34)
Alkaline Phosphatase: 116 U/L (ref 40–150)
BUN: 13.3 mg/dL (ref 7.0–26.0)
CO2: 29 meq/L (ref 22–29)
Calcium: 10.3 mg/dL (ref 8.4–10.4)
Chloride: 104 mEq/L (ref 98–109)
Creatinine: 0.9 mg/dL (ref 0.6–1.1)
GLUCOSE: 86 mg/dL (ref 70–140)
POTASSIUM: 4.2 meq/L (ref 3.5–5.1)
SODIUM: 141 meq/L (ref 136–145)
TOTAL PROTEIN: 7.1 g/dL (ref 6.4–8.3)
Total Bilirubin: 0.57 mg/dL (ref 0.20–1.20)

## 2013-08-13 LAB — CBC WITH DIFFERENTIAL/PLATELET
BASO%: 0.5 % (ref 0.0–2.0)
BASOS ABS: 0 10*3/uL (ref 0.0–0.1)
EOS ABS: 0.1 10*3/uL (ref 0.0–0.5)
EOS%: 2.1 % (ref 0.0–7.0)
HCT: 39.3 % (ref 34.8–46.6)
HEMOGLOBIN: 13.2 g/dL (ref 11.6–15.9)
LYMPH%: 18.8 % (ref 14.0–49.7)
MCH: 30.4 pg (ref 25.1–34.0)
MCHC: 33.7 g/dL (ref 31.5–36.0)
MCV: 90.2 fL (ref 79.5–101.0)
MONO#: 0.4 10*3/uL (ref 0.1–0.9)
MONO%: 5.6 % (ref 0.0–14.0)
NEUT%: 73 % (ref 38.4–76.8)
NEUTROS ABS: 4.6 10*3/uL (ref 1.5–6.5)
PLATELETS: 275 10*3/uL (ref 145–400)
RBC: 4.35 10*6/uL (ref 3.70–5.45)
RDW: 12.4 % (ref 11.2–14.5)
WBC: 6.4 10*3/uL (ref 3.9–10.3)
lymph#: 1.2 10*3/uL (ref 0.9–3.3)

## 2013-08-13 LAB — CANCER ANTIGEN 27.29: CA 27.29: 36 U/mL (ref 0–39)

## 2013-08-13 MED ORDER — TECHNETIUM TC 99M MEDRONATE IV KIT
24.1000 | PACK | Freq: Once | INTRAVENOUS | Status: AC | PRN
  Administered 2013-08-13: 24.1 via INTRAVENOUS

## 2013-08-13 MED ORDER — IOHEXOL 300 MG/ML  SOLN
80.0000 mL | Freq: Once | INTRAMUSCULAR | Status: AC | PRN
  Administered 2013-08-13: 80 mL via INTRAVENOUS

## 2013-08-14 ENCOUNTER — Encounter (HOSPITAL_COMMUNITY): Payer: 59

## 2013-08-14 ENCOUNTER — Ambulatory Visit (HOSPITAL_COMMUNITY): Payer: 59

## 2013-08-20 ENCOUNTER — Telehealth: Payer: Self-pay | Admitting: Oncology

## 2013-08-20 ENCOUNTER — Ambulatory Visit (HOSPITAL_BASED_OUTPATIENT_CLINIC_OR_DEPARTMENT_OTHER): Payer: 59 | Admitting: Oncology

## 2013-08-20 ENCOUNTER — Other Ambulatory Visit: Payer: 59

## 2013-08-20 ENCOUNTER — Ambulatory Visit (HOSPITAL_BASED_OUTPATIENT_CLINIC_OR_DEPARTMENT_OTHER): Payer: 59

## 2013-08-20 VITALS — BP 132/74 | HR 77 | Temp 97.6°F | Resp 20 | Ht 61.0 in | Wt 109.3 lb

## 2013-08-20 DIAGNOSIS — C50319 Malignant neoplasm of lower-inner quadrant of unspecified female breast: Secondary | ICD-10-CM

## 2013-08-20 DIAGNOSIS — C50312 Malignant neoplasm of lower-inner quadrant of left female breast: Secondary | ICD-10-CM

## 2013-08-20 DIAGNOSIS — C7951 Secondary malignant neoplasm of bone: Principal | ICD-10-CM

## 2013-08-20 DIAGNOSIS — C50919 Malignant neoplasm of unspecified site of unspecified female breast: Secondary | ICD-10-CM

## 2013-08-20 DIAGNOSIS — M810 Age-related osteoporosis without current pathological fracture: Secondary | ICD-10-CM

## 2013-08-20 DIAGNOSIS — Z170 Estrogen receptor positive status [ER+]: Secondary | ICD-10-CM

## 2013-08-20 DIAGNOSIS — C7952 Secondary malignant neoplasm of bone marrow: Secondary | ICD-10-CM

## 2013-08-20 LAB — COMPREHENSIVE METABOLIC PANEL (CC13)
ALT: 24 U/L (ref 0–55)
AST: 23 U/L (ref 5–34)
Albumin: 4 g/dL (ref 3.5–5.0)
Alkaline Phosphatase: 120 U/L (ref 40–150)
Anion Gap: 9 mEq/L (ref 3–11)
BILIRUBIN TOTAL: 0.52 mg/dL (ref 0.20–1.20)
BUN: 19.8 mg/dL (ref 7.0–26.0)
CALCIUM: 10.1 mg/dL (ref 8.4–10.4)
CHLORIDE: 104 meq/L (ref 98–109)
CO2: 27 mEq/L (ref 22–29)
CREATININE: 0.9 mg/dL (ref 0.6–1.1)
Glucose: 77 mg/dl (ref 70–140)
Potassium: 4 mEq/L (ref 3.5–5.1)
Sodium: 140 mEq/L (ref 136–145)
Total Protein: 7.2 g/dL (ref 6.4–8.3)

## 2013-08-20 LAB — CBC WITH DIFFERENTIAL/PLATELET
BASO%: 0.4 % (ref 0.0–2.0)
Basophils Absolute: 0 10*3/uL (ref 0.0–0.1)
EOS%: 1.7 % (ref 0.0–7.0)
Eosinophils Absolute: 0.1 10*3/uL (ref 0.0–0.5)
HCT: 38.9 % (ref 34.8–46.6)
HGB: 13 g/dL (ref 11.6–15.9)
LYMPH#: 0.9 10*3/uL (ref 0.9–3.3)
LYMPH%: 16.6 % (ref 14.0–49.7)
MCH: 30.2 pg (ref 25.1–34.0)
MCHC: 33.5 g/dL (ref 31.5–36.0)
MCV: 90 fL (ref 79.5–101.0)
MONO#: 0.3 10*3/uL (ref 0.1–0.9)
MONO%: 5.9 % (ref 0.0–14.0)
NEUT#: 4.2 10*3/uL (ref 1.5–6.5)
NEUT%: 75.4 % (ref 38.4–76.8)
Platelets: 272 10*3/uL (ref 145–400)
RBC: 4.32 10*6/uL (ref 3.70–5.45)
RDW: 12.7 % (ref 11.2–14.5)
WBC: 5.6 10*3/uL (ref 3.9–10.3)

## 2013-08-20 NOTE — Telephone Encounter (Signed)
, °

## 2013-08-20 NOTE — Progress Notes (Signed)
ID: Sonia Hill   DOB: 05-Jun-1956  MR#: [id number]  ZPH#:[id number]  PCP: Mylinda Latina, MD GYN: Molli Posey SU: Donnie Mesa OTHER MD: Minette Brine, Carvel Getting [phone])  BREAST CANCER HISTORY:  Sonia Hill had screening mammography on 11/10/2009 showing very dense tissue with a possible distortion in the left breast.  Digital mammography on June 10th showed a 1.5 cm cluster of microcalcifications in the lower inner portion of the breast.  On physical exam, there was no palpable mass and no palpable axillary adenopathy.  There was some nipple inversion inferolaterally.  Ultrasound showed a 2.6 cm irregular mass-like area with dense posterior acoustic shadowing in the 3 o'clock position, 1 cm from the nipple, but normal appearing left axillary lymph nodes.    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  A second mass noted on ultrasound was also lobular, also low grade.  Both were ER and PR positive at well over 90%.  Both had low proliferation markers at 9%, and both were Her-2 negative by CISH with ratios of 1.6 and 1.24.  In short, both masses were nearly identical.    On June 16th, the patient had bilateral breast MRIs which showed in the left breast at 3 o'clock an irregular area of abnormal enhancement measuring up to 2.7 cm and in the left lower inner quadrant, a second post-biopsy area measuring 1.5 cm.  The difference between these two areas was stated at the conference the morning of the visit to be approximately 5 cm.  Given this data the patient opted for Left mastectomy with sentinel lymph node sampling, performed July 2011 with results as detailed below.  INTERVAL HISTORY: Sonia Hill returns today for followup of her metastatic breast cancer. She has been supposedly on letrozole but today she tells me that she went off at  approximately October of 2014, to concentrate on alternative treatments and supplements. She is going also do yoga 3 times a week, has an excellent diet, and a goes to a chiropractor once a month.  REVIEW OF SYSTEMS: A detailed review of systems today was entirely negative. She is a little bit behind on getting her eyes checked.  PAST MEDICAL HISTORY: Past Medical History  Diagnosis Date  . SVD (spontaneous vaginal delivery)     x 1  . PONV (postoperative nausea and vomiting)   . Hypothyroidism   . Hyperlipidemia     diet controlled  . Depression     Hx - no current problem  . Chronic kidney disease     Only has right kidney  . GERD (gastroesophageal reflux disease)     diet controlled - no meds  . Arthritis     neck, upper back, shoulders - no meds - yoga  . lt breast ca dx'd 11/2009  1. Osteopenia. 2. Congenital left renal atrophy.  3. History of recurrent UTIs. 4. History of bladder reconstruction for reflux more than 18 years ago. 5. History of depression and anxiety. 6. History of hypothyroidism.  7. History of "sling procedure".  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    FAMILY HISTORY The patient's father is alive in his early 79s.  He has Alzheimer's disease. The patient's mother was diagnosed with breast cancer at the age of 59.  She died at the age of 71.  The patient's father had one out of two sisters with breast cancer.  There are other breast cancers on the mother's side.  The patient has one brother and one younger sister, neither with cancer.    GYNECOLOGIC HISTORY: She is GX P1.  That pregnancy was at age 16.  Menarche was at age 58.  She was perimenopausal at the time of diagnosis but is now convincingly postmenopausal  SOCIAL HISTORY: She works as a Electrical engineer, part-time and on Land.  Her husband, Sonia Hill, is a Architect at [employer].  Sister Sonia Hill works as a Pharmacist, hospital in  [place].  The patient's son Sonia Hill is  in college.  The patient attends [employer].   ADVANCED DIRECTIVES: in place  HEALTH MAINTENANCE: History  Substance Use Topics  . Smoking status: Never Smoker   . Smokeless tobacco: Never Used  . Alcohol Use: No     Colonoscopy:  PAP: UTD  Bone density: Sonia Hill 2014: osteoporosis  Lipid panel: 08/12/2011 total 204, HDL 46, LDL 136  Allergies  Allergen Reactions  . Adhesive [Tape] Rash    Ok to use paper tape and tegaderm over IV site  . Betadine [Povidone Iodine] Rash  . Mercury Rash  . Sulfa Antibiotics Rash    Current Outpatient Prescriptions  Medication Sig Dispense Refill  . Acetylcysteine (NAC) 600 MG CAPS Take 1 capsule by mouth daily.      Marland Kitchen ascorbic acid (VITAMIN C) 1000 MG tablet Take 1,000 mg by mouth 2 (two) times daily.      Marland Kitchen BIOTIN FORTE PO Take 1 tablet by mouth daily.      . Bromelains 500 MG TABS Take 1 tablet by mouth daily.      . Calcium-Vitamin D-Vitamin K (CALCIUM SOFT CHEWS PO) Take 1 each by mouth 3 (three) times daily.      . Cholecalciferol (VITAMIN D-3) 5000 UNITS TABS Take 1 tablet by mouth daily.      . Coenzyme Q10 (CO Q-10) 200 MG CAPS Take 1 capsule by mouth daily.      Marland Kitchen FOLIC ACID PO Take 1 tablet by mouth daily.      Nyoka Cowden Tea, Camillia sinensis, (GREEN TEA EXTRACT PO) Take 1 tablet by mouth daily.      Marland Kitchen letrozole (FEMARA) 2.5 MG tablet Take 1 tablet (2.5 mg total) by mouth daily.  30 tablet  11  . MAGNESIUM PO Take 1 tablet by mouth daily as needed (leg cramps).      . MULTIPLE VITAMIN PO Take 1 tablet by mouth daily.       . niacin 250 MG tablet Take 250 mg by mouth at bedtime.      . OMEGA 3 1200 MG CAPS Take 1 capsule by mouth 2 (two) times daily.      Marland Kitchen OVER THE COUNTER MEDICATION Take 1 tablet by mouth daily. Iodoral Iodine 12.42m supplement      . Probiotic Product (PROBIOTIC DAILY PO) Take 1 tablet by mouth daily.       . Strontium Chloride CRYS Take 300 mg by mouth daily.       .Marland Kitchenthyroid (ARMOUR) 60 MG tablet Take 60 mg by mouth daily before breakfast.       No current facility-administered medications for this visit.    OBJECTIVE: Middle aged white woman in no acute distress Filed Vitals:   08/20/13 0923  BP: 132/74  Pulse: 77  Temp: 97.6 F (36.4 C)  Resp: 20  Body mass index is 20.66 kg/(m^2).    ECOG FS: 0 Filed Weights   08/20/13 0923  Weight: 109 lb 4.8 oz (49.578 kg)   Sclerae unicteric, pupils round and equal Oropharynx clear and moist No cervical or supraclavicular adenopathy Lungs clear to auscultation with good excursion bilaterally Heart regular rate and rhythm Abdomen  soft, nontender, positive bowel sounds MSK no focal spinal tenderness, no upper extremity lymphedema  Neuro: nonfocal, well oriented, positive affect Breasts: The right breast is unremarkable. There is minimal crust on the nipple. The left breast is status post mastectomy. There is no evidence of local recurrence. The left axilla is benign  LAB RESULTS:   Lab Results  Component Value Date   WBC 6.4 08/13/2013   NEUTROABS 4.6 08/13/2013   HGB 13.2 08/13/2013   HCT 39.3 08/13/2013   MCV 90.2 08/13/2013   PLT 275 08/13/2013      Chemistry      Component Value Date/Time   NA 141 08/13/2013 0947   NA 140 03/23/2012 1557   K 4.2 08/13/2013 0947   K 3.7 03/23/2012 1557   CL 98 12/03/2012 1419   CL 102 03/23/2012 1557   CO2 29 08/13/2013 0947   CO2 28 03/23/2012 1557   BUN 13.3 08/13/2013 0947   BUN 12 03/23/2012 1557   CREATININE 0.9 08/13/2013 0947   CREATININE 0.79 03/23/2012 1557      Component Value Date/Time   CALCIUM 10.3 08/13/2013 0947   CALCIUM 10.1 03/23/2012 1557   CALCIUM 10.1 08/01/2011 1415   ALKPHOS 116 08/13/2013 0947   ALKPHOS 93 03/23/2012 1557   AST 29 08/13/2013 0947   AST 33 03/23/2012 1557   ALT 28 08/13/2013 0947   ALT 33 03/23/2012 1557   BILITOT 0.57 08/13/2013 0947   BILITOT 0.5 03/23/2012 1557      Lab Results  Component Value Date   LABCA2 36  08/13/2013  .   STUDIES: Ct Chest W Contrast  08/13/2013   CLINICAL DATA:  Metastatic breast cancer diagnosed 2011, status post left mastectomy, chemotherapy complete. History of left nephrectomy.  EXAM: CT CHEST WITH CONTRAST  TECHNIQUE: Multidetector CT imaging of the chest was performed during intravenous contrast administration.  CONTRAST:  41m OMNIPAQUE IOHEXOL 300 MG/ML  SOLN  COMPARISON:  CT chest dated 07/30/2010  FINDINGS: Lungs are essentially clear. No suspicious pulmonary nodules. Calcified granuloma in the left upper lobe (series 5/ image 31). No pleural effusion or pneumothorax.  Visualized thyroid is unremarkable.  The heart is normal in size.  No pericardial effusion.  No suspicious mediastinal, hilar, or axillary lymphadenopathy.  Status post left mastectomy with left axillary lymph node dissection.  Visualized upper abdomen is unremarkable.  Mild degenerative changes of the thoracic spine. Sclerosis involving the left posterolateral 8th rib (series 5/ image 31), unchanged. Prior faint sclerotic lesions throughout the thoracolumbar spine and sternum are not currently evident.  IMPRESSION: Status post left mastectomy and left axillary lymph node dissection.  Sclerosis involving the left posterolateral 8th rib, unchanged. Additional prior faint sclerotic lesions are not currently evident. Correlate with pending bone scan.  Otherwise, no evidence of recurrent or metastatic disease in the chest.   Electronically Signed   By: SJulian HyM.D.   On: 08/13/2013 12:51   Nm Bone Scan Whole Body  08/13/2013   CLINICAL DATA:  Metastatic breast carcinoma. History of a left mastectomy. No bone pain.  EXAM: NUCLEAR MEDICINE WHOLE BODY BONE SCAN  TECHNIQUE: Whole body anterior and  posterior images were obtained approximately 3 hours after intravenous injection of radiopharmaceutical.  COMPARISON:  01/22/2010  RADIOPHARMACEUTICALS:  24.1 mCi Technetium-99 MDP  FINDINGS: There are no areas of abnormal  radiotracer localization 2 bone to suggest active metastatic disease.  Uptake is noted along the patella and at the ankles and feet, which appears degenerative.  A single right kidney is again noted.  IMPRESSION: No evidence of metastatic disease to bone.   Electronically Signed   By: Lajean Manes M.D.   On: 08/13/2013 14:35    ASSESSMENT: 58 y.o.  BRCA 1-2 negative [facility] woman  (1) status post left mastectomy and sentinel lymph node sampling in July 2011 for a T1 N1 M1, stage IV invasive lobular breast cancer, grade 1, strongly estrogen and progesterone receptor-positive, HER2 negative with MIB-1 of 9% and no HER2 amplification,   (2) with multiple sclerotic bone lesions at presentation seen only on CT scan (not on bone scan or PET scan), but  with biopsy-proven metastatic disease to bone; and an elevated CA 27.29 at presentation,   (3) Oncotype recurrence score of 4, predicts a good response to antiestrogens.  (4) Systemic treatment has consisted of  a) tamoxifen with evidence of response but poor tolerance  b) letrozole starting August 2012, discontinued October 2014 per patient   (5) single functioning kidney  (6) status post bilateral salpingo-oophorectomy 01/24/2013, with benign pathology  (7) osteoporosis; the patient refuses bisphosphonate therapy; started osteostrong February 2014  PLAN:  Bambi is doing terrific as far as her cancer is concerned. She has been off treatment now for approximately 5 months. She has noted less arthritis, but then she is also participating in other programs and activities to increase her mobility and decreased or inflammatory markers.  She would like me to follow her CA 27-29, PTH, and C-reactive protein and we will be glad to add these to her every three-month labs at her request. We will make sure her other physicians get a copy. I am delighted that the results of her CT scan of the chest and bone scan. We are going to obtain a liver MRI before her  next visit here which will be in 3 months.  Donielle gave me a book by Sherron Ales and some information from the [employer] she would like me to review. She is also leading [facility] on nutrition. She knows to call for any problems that may develop before her next visit here.  Per Beagley C    08/20/2013

## 2013-08-21 LAB — C-REACTIVE PROTEIN

## 2013-08-21 LAB — CANCER ANTIGEN 27.29: CA 27.29: 27 U/mL (ref 0–39)

## 2013-08-21 LAB — PTH, INTACT AND CALCIUM
CALCIUM: 9.6 mg/dL (ref 8.4–10.5)
PTH: 27.1 pg/mL (ref 14.0–72.0)

## 2013-09-19 ENCOUNTER — Telehealth: Payer: Self-pay | Admitting: Physician Assistant

## 2013-09-19 NOTE — Telephone Encounter (Signed)
per AB to rech to am appt/cld and left pt message of new time of appt

## 2013-11-29 ENCOUNTER — Ambulatory Visit (HOSPITAL_COMMUNITY)
Admission: RE | Admit: 2013-11-29 | Discharge: 2013-11-29 | Disposition: A | Discharge status: Home / Self Care | Payer: 59 | Source: Ambulatory Visit | Attending: Oncology | Admitting: Oncology

## 2013-11-29 ENCOUNTER — Other Ambulatory Visit: Payer: Self-pay | Admitting: Oncology

## 2013-11-29 ENCOUNTER — Other Ambulatory Visit (HOSPITAL_BASED_OUTPATIENT_CLINIC_OR_DEPARTMENT_OTHER): Payer: 59

## 2013-11-29 DIAGNOSIS — C7951 Secondary malignant neoplasm of bone: Secondary | ICD-10-CM

## 2013-11-29 DIAGNOSIS — C50312 Malignant neoplasm of lower-inner quadrant of left female breast: Secondary | ICD-10-CM

## 2013-11-29 DIAGNOSIS — C50319 Malignant neoplasm of lower-inner quadrant of unspecified female breast: Secondary | ICD-10-CM

## 2013-11-29 DIAGNOSIS — Z905 Acquired absence of kidney: Secondary | ICD-10-CM | POA: Insufficient documentation

## 2013-11-29 DIAGNOSIS — C50919 Malignant neoplasm of unspecified site of unspecified female breast: Secondary | ICD-10-CM

## 2013-11-29 DIAGNOSIS — K7689 Other specified diseases of liver: Secondary | ICD-10-CM | POA: Insufficient documentation

## 2013-11-29 DIAGNOSIS — Z853 Personal history of malignant neoplasm of breast: Secondary | ICD-10-CM | POA: Insufficient documentation

## 2013-11-29 LAB — COMPREHENSIVE METABOLIC PANEL (CC13)
ALT: 23 U/L (ref 0–55)
AST: 23 U/L (ref 5–34)
Albumin: 3.9 g/dL (ref 3.5–5.0)
Alkaline Phosphatase: 95 U/L (ref 40–150)
Anion Gap: 8 mEq/L (ref 3–11)
BUN: 15.6 mg/dL (ref 7.0–26.0)
CO2: 28 mEq/L (ref 22–29)
CREATININE: 0.8 mg/dL (ref 0.6–1.1)
Calcium: 10.1 mg/dL (ref 8.4–10.4)
Chloride: 101 mEq/L (ref 98–109)
GLUCOSE: 90 mg/dL (ref 70–140)
Potassium: 4.2 mEq/L (ref 3.5–5.1)
Sodium: 138 mEq/L (ref 136–145)
Total Bilirubin: 0.44 mg/dL (ref 0.20–1.20)
Total Protein: 7.1 g/dL (ref 6.4–8.3)

## 2013-11-29 LAB — CBC WITH DIFFERENTIAL/PLATELET
BASO%: 0.1 % (ref 0.0–2.0)
BASOS ABS: 0 10*3/uL (ref 0.0–0.1)
EOS ABS: 0.1 10*3/uL (ref 0.0–0.5)
EOS%: 1.5 % (ref 0.0–7.0)
HCT: 38.1 % (ref 34.8–46.6)
HEMOGLOBIN: 12.9 g/dL (ref 11.6–15.9)
LYMPH#: 1.4 10*3/uL (ref 0.9–3.3)
LYMPH%: 18.1 % (ref 14.0–49.7)
MCH: 30.1 pg (ref 25.1–34.0)
MCHC: 33.9 g/dL (ref 31.5–36.0)
MCV: 89 fL (ref 79.5–101.0)
MONO#: 0.4 10*3/uL (ref 0.1–0.9)
MONO%: 5.2 % (ref 0.0–14.0)
NEUT%: 75.1 % (ref 38.4–76.8)
NEUTROS ABS: 5.7 10*3/uL (ref 1.5–6.5)
Platelets: 257 10*3/uL (ref 145–400)
RBC: 4.28 10*6/uL (ref 3.70–5.45)
RDW: 12.6 % (ref 11.2–14.5)
WBC: 7.5 10*3/uL (ref 3.9–10.3)

## 2013-11-29 MED ORDER — GADOBENATE DIMEGLUMINE 529 MG/ML IV SOLN
10.0000 mL | Freq: Once | INTRAVENOUS | Status: AC | PRN
  Administered 2013-11-29: 10 mL via INTRAVENOUS

## 2013-12-02 LAB — PTH, INTACT AND CALCIUM
Calcium: 10 mg/dL (ref 8.4–10.5)
PTH: 27.5 pg/mL (ref 14.0–72.0)

## 2013-12-02 LAB — C-REACTIVE PROTEIN: CRP: <0.5 mg/dL (ref <0.60)

## 2013-12-02 LAB — CANCER ANTIGEN 27.29: CA 27.29: 36 U/mL (ref 0–39)

## 2013-12-06 ENCOUNTER — Ambulatory Visit (HOSPITAL_BASED_OUTPATIENT_CLINIC_OR_DEPARTMENT_OTHER): Payer: 59 | Admitting: Physician Assistant

## 2013-12-06 ENCOUNTER — Telehealth: Payer: Self-pay | Admitting: Oncology

## 2013-12-06 ENCOUNTER — Ambulatory Visit: Payer: 59 | Admitting: Physician Assistant

## 2013-12-06 ENCOUNTER — Encounter: Payer: Self-pay | Admitting: Physician Assistant

## 2013-12-06 VITALS — BP 128/68 | HR 71 | Temp 98.7°F | Resp 18 | Ht 61.0 in | Wt 107.1 lb

## 2013-12-06 DIAGNOSIS — M810 Age-related osteoporosis without current pathological fracture: Secondary | ICD-10-CM

## 2013-12-06 DIAGNOSIS — C50319 Malignant neoplasm of lower-inner quadrant of unspecified female breast: Secondary | ICD-10-CM

## 2013-12-06 DIAGNOSIS — C50312 Malignant neoplasm of lower-inner quadrant of left female breast: Secondary | ICD-10-CM

## 2013-12-06 DIAGNOSIS — C7951 Secondary malignant neoplasm of bone: Secondary | ICD-10-CM

## 2013-12-06 DIAGNOSIS — Z803 Family history of malignant neoplasm of breast: Secondary | ICD-10-CM

## 2013-12-06 DIAGNOSIS — C50919 Malignant neoplasm of unspecified site of unspecified female breast: Secondary | ICD-10-CM

## 2013-12-06 DIAGNOSIS — C7952 Secondary malignant neoplasm of bone marrow: Secondary | ICD-10-CM

## 2013-12-06 HISTORY — DX: Age-related osteoporosis without current pathological fracture: M81.0

## 2013-12-06 NOTE — Telephone Encounter (Signed)
[facility] PT APPT SCHEDULE FOR OCTOBER

## 2013-12-06 NOTE — Progress Notes (Signed)
ID: Sonia Hill   DOB: [birth year]  MR#: [id number]  YPP#:[id number]  PCP: Mylinda Latina, MD GYN: Molli Posey SU: Donnie Mesa OTHER MD: Minette Brine, Carvel Getting [phone])  BREAST CANCER HISTORY:  Sonia Hill had screening mammography on 11/10/2009 showing very dense tissue with a possible distortion in the left breast.  Digital mammography on June 10th showed a 1.5 cm cluster of microcalcifications in the lower inner portion of the breast.  On physical exam, there was no palpable mass and no palpable axillary adenopathy.  There was some nipple inversion inferolaterally.  Ultrasound showed a 2.6 cm irregular mass-like area with dense posterior acoustic shadowing in the 3 o'clock position, 1 cm from the nipple, but normal appearing left axillary lymph nodes.    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  A second mass noted on ultrasound was also lobular, also low grade.  Both were ER and PR positive at well over 90%.  Both had low proliferation markers at 9%, and both were Her-2 negative by CISH with ratios of 1.6 and 1.24.  In short, both masses were nearly identical.    On June 16th, the patient had bilateral breast MRIs which showed in the left breast at 3 o'clock an irregular area of abnormal enhancement measuring up to 2.7 cm and in the left lower inner quadrant, a second post-biopsy area measuring 1.5 cm.  The difference between these two areas was stated at the conference the morning of the visit to be approximately 5 cm.  Given this data the patient opted for Left mastectomy with sentinel lymph node sampling, performed July 2011 with results as detailed below.  Subsequent history is as detailed below.  INTERVAL HISTORY: Sonia Hill returns alone today for followup of her metastatic breast cancer. As a recap, Sonia Hill apparently stopped her  letrozole on her own approximately October of 2014. She continues on several alternative treatments and supplements, and is followed closely by Dr. Tye Savoy and Dr. Julien Nordmann. She still exercising regularly, doing yoga at least twice weekly, and also participating in a local [place] group which she enjoys.     REVIEW OF SYSTEMS: Otherwise, Sonia Hill denies any recent illnesses and has had no fevers or chills. She denies any hot flashes or night sweats. She's had no rashes or skin changes and denies any abnormal bruising or bleeding. Her energy level is good, as is her appetite. She denies any nausea or change in bowel or bladder habits. She's had some sinus congestion and a runny nose which she attributes to seasonal allergies. She denies any cough, phlegm production, increased shortness of breath, peripheral swelling, chest pain, or palpitations. She's had no abnormal headaches or dizziness and denies any change in vision. She feels a little forgetful at times. She currently denies any unusual myalgias, arthralgias, or bony pain.  A detailed review of systems is otherwise stable and noncontributory.   PAST MEDICAL HISTORY: Past Medical History  Diagnosis Date  . SVD (spontaneous vaginal delivery)     x 1  . PONV (postoperative nausea and vomiting)   . Hypothyroidism   . Hyperlipidemia     diet controlled  . Depression     Hx - no current problem  . Chronic kidney disease     Only has right kidney  . GERD (gastroesophageal reflux disease)     diet controlled - no meds  .  Arthritis     neck, upper back, shoulders - no meds - yoga  . lt breast ca dx'd 11/2009  . Osteoporosis, unspecified 12/06/2013  1. Osteopenia. 2. Congenital left renal atrophy.  3. History of recurrent UTIs. 4. History of bladder reconstruction for reflux more than 18 years ago. 5. History of depression and anxiety. 6. History of hypothyroidism.  7. History of "sling procedure".  History of "freezing" of what sounds  like a squamous cell involving the nose (she describes it as "scaly".    FAMILY HISTORY The patient's father is alive in his early 98s.  He has Alzheimer's disease. The patient's mother was diagnosed with breast cancer at the age of 23.  She died at the age of 57.  The patient's father had one out of two sisters with breast cancer.  There are other breast cancers on the mother's side.  The patient has one brother and one younger sister, neither with cancer.    GYNECOLOGIC HISTORY:  (Reviewed 12/06/2013) She is GX P1.  That pregnancy was at age 37.  Menarche was at age 54.  She was perimenopausal at the time of diagnosis but is now convincingly postmenopausal  SOCIAL HISTORY:  (Reviewed 12/06/2013) She works as a Electrical engineer, part-time and on consultation.  Her husband, [name], is a Architect at [employer].  [name] works as a Pharmacist, hospital in [place].  The patient's son [name] is  in college.  The patient attends [employer].   ADVANCED DIRECTIVES: in place  HEALTH MAINTENANCE: (Updated 12/06/2013)  History  Substance Use Topics  . Smoking status: Never Smoker   . Smokeless tobacco: Never Used  . Alcohol Use: No     Colonoscopy: Not on file  PAP: UTD/May 2015, Dr. Matthew Saras  Bone density: [name] 2014, osteoporosis  Lipid panel: UTD (Not on file)    Allergies  Allergen Reactions  . Adhesive [Tape] Rash    Ok to use paper tape and tegaderm over IV site  . Betadine [Povidone Iodine] Rash  . Mercury Rash  . Sulfa Antibiotics Rash    Current Outpatient Prescriptions  Medication Sig Dispense Refill  . Acetylcysteine (NAC) 600 MG CAPS Take 1 capsule by mouth daily.      Marland Kitchen ascorbic acid (VITAMIN C) 1000 MG tablet Take 1,000 mg by mouth 2 (two) times daily.      Marland Kitchen BIOTIN FORTE PO Take 1 tablet by mouth daily.      . Bromelains 500 MG TABS Take 1 tablet by mouth daily.      . Calcium-Vitamin D-Vitamin K (CALCIUM SOFT CHEWS PO) Take 1 each by mouth 2 (two) times  daily. Pt takes 2 capsules twice a day.      . Cholecalciferol (VITAMIN D-3) 5000 UNITS TABS Take 1 tablet by mouth daily.      . Coenzyme Q10 (CO Q-10) 200 MG CAPS Take 1 capsule by mouth daily.      Marland Kitchen FOLIC ACID PO Take 1 tablet by mouth daily.      Nyoka Cowden Tea, Camillia sinensis, (GREEN TEA EXTRACT PO) Take 1 tablet by mouth daily.      Marland Kitchen MAGNESIUM PO Take 1 tablet by mouth daily as needed (leg cramps).      . MULTIPLE VITAMIN PO Take 1 tablet by mouth daily. Pt takes 2 capsules at each meal. Total of 6 per day.      . niacin 250 MG tablet Take 250 mg by mouth at bedtime.      Marland Kitchen  OMEGA 3 1200 MG CAPS Take 1 capsule by mouth 2 (two) times daily.      Marland Kitchen OVER THE COUNTER MEDICATION Take 1 tablet by mouth daily. Iodoral Iodine 12.48m supplement      . Probiotic Product (PROBIOTIC DAILY PO) Take 1 tablet by mouth daily.       . Strontium Chloride CRYS Take 300 mg by mouth daily.      .Marland Kitchenthyroid (ARMOUR) 60 MG tablet Take 60 mg by mouth daily before breakfast.       No current facility-administered medications for this visit.    OBJECTIVE: Middle aged white woman who appears well  Filed Vitals:   12/06/13 0953  BP: 128/68  Pulse: 71  Temp: 98.7 F (37.1 C)  Resp: 18     Body mass index is 20.25 kg/(m^2).    ECOG FS: 0 Filed Weights   12/06/13 0953  Weight: 107 lb 1.6 oz (48.58 kg)   Physical Exam: HEENT:  Sclerae anicteric. Buccal mucosa is pink and moist. Oropharynx clear. No evidence of mucositis or candidiasis. No pharyngeal erythema. Next couple, trachea midline. NODES:  No cervical or supraclavicular lymphadenopathy palpated.  BREAST EXAM:  Right breast is unremarkable other than the fact that the breast tissue is dense to palpation. Patient is status post left mastectomy. There no suspicious nodularities or skin changes, and no evidence of local recurrence in the chest wall. Axillae are benign bilaterally, no palpable lymphadenopathy. LUNGS:  Clear to auscultation bilaterally.  No  wheezes or rhonchi HEART:  Regular rate and rhythm. No murmur appreciated. ABDOMEN:  Soft, thin, nontender.  Positive bowel sounds.  MSK:  No focal spinal tenderness to palpation. Good range of motion bilaterally in the upper extremities. EXTREMITIES:  No peripheral edema.  No lymphedema in the left upper extremity. SKIN:  No visible rashes. No excessive ecchymoses. No petechiae. No pallor. Good skin turgor. NEURO:  Nonfocal. Well oriented.  Appropriate affect.    LAB RESULTS:   Lab Results  Component Value Date   WBC 7.5 11/29/2013   NEUTROABS 5.7 11/29/2013   HGB 12.9 11/29/2013   HCT 38.1 11/29/2013   MCV 89.0 11/29/2013   PLT 257 11/29/2013      Chemistry      Component Value Date/Time   NA 138 11/29/2013 1419   NA 140 03/23/2012 1557   K 4.2 11/29/2013 1419   K 3.7 03/23/2012 1557   CL 98 12/03/2012 1419   CL 102 03/23/2012 1557   CO2 28 11/29/2013 1419   CO2 28 03/23/2012 1557   BUN 15.6 11/29/2013 1419   BUN 12 03/23/2012 1557   CREATININE 0.8 11/29/2013 1419   CREATININE 0.79 03/23/2012 1557      Component Value Date/Time   CALCIUM 10.1 11/29/2013 1419   CALCIUM 10.0 11/29/2013 1419   CALCIUM 10.1 08/01/2011 1415   ALKPHOS 95 11/29/2013 1419   ALKPHOS 93 03/23/2012 1557   AST 23 11/29/2013 1419   AST 33 03/23/2012 1557   ALT 23 11/29/2013 1419   ALT 33 03/23/2012 1557   BILITOT 0.44 11/29/2013 1419   BILITOT 0.5 03/23/2012 1557      Lab Results  Component Value Date   LABCA2 36 11/29/2013    C-REACTIVE PROTEIN <0.5  11/29/2013  PTH    27.5  06/19/215     STUDIES:  Patient declined annual mammography, but has annual thermography. This was last evaluated in mid April 2015, and she is requesting that those results be faxed to  our office.  Most recent bone density was obtained under the care of Dr. Matthew Saras at [facility], 06/25/2012, showing osteoporosis with a T score -2.6. This is scheduled to be repeated in July 2015.   MR Liver W Wo  Contrast 11/29/2013   CLINICAL DATA:  Followup liver lesion.  History of breast cancer.  EXAM: MRI ABDOMEN WITHOUT AND WITH CONTRAST  TECHNIQUE: Multiplanar multisequence MR imaging of the abdomen was performed both before and after the administration of intravenous contrast.  CONTRAST:  56m MULTIHANCE GADOBENATE DIMEGLUMINE 529 MG/ML IV SOLN  COMPARISON:  01/27/2011  FINDINGS: The lung bases are clear. No pleural or pericardial effusion. T2 hyperintense structure along the anterior right hepatic lobe measures approximately 5 mm, image 7/series 3. Following IV contrast administration this has enhancement characteristics compatible with a benign hemangioma. No new liver abnormalities identified. The gallbladder is normal. There is no biliary dilatation. Normal appearance of the pancreas. The spleen appears normal. Normal appearance of the adrenal glands. The right kidney appears normal. The left kidney is surgically absent. Normal caliber of the abdominal aorta. No aneurysm. There is no upper abdominal adenopathy noted. No free fluid or abnormal fluid collections identified within the abdomen. Normal signal is identified from within the bone marrow.  IMPRESSION: 1. Stable small hemangioma within the anterior right hepatic lobe. 2. No evidence of abdominal metastatic disease.   Electronically Signed   By: TKerby MoorsM.D.   On: 11/29/2013 16:27    ASSESSMENT: 58y.o.  [facility]  (1) status post left mastectomy and sentinel lymph node sampling in July 2011 for a T1 N1 M1, stage IV invasive lobular breast cancer, grade 1, strongly estrogen and progesterone receptor-positive, HER2 negative with MIB-1 of 9% and no HER2 amplification,   (2) with multiple sclerotic bone lesions at presentation seen only on CT scan (not on bone scan or PET scan), but  with biopsy-proven metastatic disease to bone; and an elevated CA 27.29 at presentation,   (3) Oncotype recurrence score of 4, predicts a  good response to antiestrogens.  (4) Systemic treatment has consisted of  a) tamoxifen with evidence of response but poor tolerance  b) letrozole starting August 2012, discontinued October 2014 per patient   (5) single functioning kidney  (6) status post bilateral salpingo-oophorectomy 01/24/2013, with benign pathology  (7) osteoporosis; the patient refuses bisphosphonate therapy; [facility].  Scheduled for next bone density in July 2015 under the care of Dr. HMatthew Saras  PLAN:  With regards to her breast cancer, Sonia Hill appears to be doing very well. Of course the results of her chest CT and bone scan 3 months ago were good, and the recent liver MRI also showed no evidence of metastatic disease. We will continue following her closely on a every 3 month basis, and she will return for repeat labs and physical exam with Dr. MJana Hakimin early October. We will continue to follow her CA 27-29, PTH, and C-reactive protein and we will be glad to add these to her every three-month labs at her request.  JLataravoices understanding and agreement with our plan for followup. She knows to call with any changes or problems prior to her next appointment.  BERRY,AMY PA-C    12/06/2013

## 2014-03-13 ENCOUNTER — Other Ambulatory Visit: Payer: 59

## 2014-03-13 ENCOUNTER — Other Ambulatory Visit (HOSPITAL_BASED_OUTPATIENT_CLINIC_OR_DEPARTMENT_OTHER): Payer: 59

## 2014-03-13 DIAGNOSIS — C50319 Malignant neoplasm of lower-inner quadrant of unspecified female breast: Secondary | ICD-10-CM

## 2014-03-13 DIAGNOSIS — C50919 Malignant neoplasm of unspecified site of unspecified female breast: Secondary | ICD-10-CM

## 2014-03-13 DIAGNOSIS — C50312 Malignant neoplasm of lower-inner quadrant of left female breast: Secondary | ICD-10-CM

## 2014-03-13 DIAGNOSIS — C7951 Secondary malignant neoplasm of bone: Secondary | ICD-10-CM

## 2014-03-13 LAB — CBC WITH DIFFERENTIAL/PLATELET
BASO%: 0.2 % (ref 0.0–2.0)
Basophils Absolute: 0 10*3/uL (ref 0.0–0.1)
EOS ABS: 0.1 10*3/uL (ref 0.0–0.5)
EOS%: 1.4 % (ref 0.0–7.0)
HCT: 35.9 % (ref 34.8–46.6)
HGB: 12.3 g/dL (ref 11.6–15.9)
LYMPH%: 22.9 % (ref 14.0–49.7)
MCH: 30.4 pg (ref 25.1–34.0)
MCHC: 34.3 g/dL (ref 31.5–36.0)
MCV: 88.9 fL (ref 79.5–101.0)
MONO#: 0.4 10*3/uL (ref 0.1–0.9)
MONO%: 6.3 % (ref 0.0–14.0)
NEUT#: 3.9 10*3/uL (ref 1.5–6.5)
NEUT%: 69.2 % (ref 38.4–76.8)
PLATELETS: 243 10*3/uL (ref 145–400)
RBC: 4.04 10*6/uL (ref 3.70–5.45)
RDW: 12.2 % (ref 11.2–14.5)
WBC: 5.7 10*3/uL (ref 3.9–10.3)
lymph#: 1.3 10*3/uL (ref 0.9–3.3)

## 2014-03-13 LAB — COMPREHENSIVE METABOLIC PANEL (CC13)
ALK PHOS: 117 U/L (ref 40–150)
ALT: 28 U/L (ref 0–55)
AST: 28 U/L (ref 5–34)
Albumin: 3.9 g/dL (ref 3.5–5.0)
Anion Gap: 7 mEq/L (ref 3–11)
BILIRUBIN TOTAL: 0.5 mg/dL (ref 0.20–1.20)
BUN: 14 mg/dL (ref 7.0–26.0)
CO2: 26 mEq/L (ref 22–29)
Calcium: 10.1 mg/dL (ref 8.4–10.4)
Chloride: 105 mEq/L (ref 98–109)
Creatinine: 0.8 mg/dL (ref 0.6–1.1)
GLUCOSE: 78 mg/dL (ref 70–140)
Potassium: 3.9 mEq/L (ref 3.5–5.1)
Sodium: 137 mEq/L (ref 136–145)
TOTAL PROTEIN: 7.3 g/dL (ref 6.4–8.3)

## 2014-03-14 LAB — PTH, INTACT AND CALCIUM
CALCIUM: 9.8 mg/dL (ref 8.4–10.5)
PTH: 24 pg/mL (ref 14–64)

## 2014-03-14 LAB — CANCER ANTIGEN 27.29: CA 27.29: 35 U/mL (ref 0–39)

## 2014-03-14 LAB — C-REACTIVE PROTEIN

## 2014-03-18 ENCOUNTER — Ambulatory Visit (HOSPITAL_BASED_OUTPATIENT_CLINIC_OR_DEPARTMENT_OTHER): Payer: 59 | Admitting: Oncology

## 2014-03-18 VITALS — BP 123/62 | HR 63 | Temp 98.1°F | Resp 18 | Ht 61.0 in | Wt 110.5 lb

## 2014-03-18 DIAGNOSIS — C50912 Malignant neoplasm of unspecified site of left female breast: Secondary | ICD-10-CM

## 2014-03-18 DIAGNOSIS — Z170 Estrogen receptor positive status [ER+]: Secondary | ICD-10-CM

## 2014-03-18 DIAGNOSIS — C7951 Secondary malignant neoplasm of bone: Secondary | ICD-10-CM

## 2014-03-18 DIAGNOSIS — M810 Age-related osteoporosis without current pathological fracture: Secondary | ICD-10-CM

## 2014-03-18 DIAGNOSIS — C50312 Malignant neoplasm of lower-inner quadrant of left female breast: Secondary | ICD-10-CM

## 2014-03-18 NOTE — Progress Notes (Signed)
ID: ARIAM MOL   DOB: 05-Feb-1956  MR#: [id number]  BWI#:[id number]  PCP: Mylinda Latina, MD GYN: Molli Posey SU: Donnie Mesa OTHER MD: Minette Brine, Carvel Getting [phone])  BREAST CANCER HISTORY:  From the earlier intake note:  Francile had screening mammography on 11/10/2009 showing very dense tissue with a possible distortion in the left breast.  Digital mammography on June 10th showed a 1.5 cm cluster of microcalcifications in the lower inner portion of the breast.  On physical exam, there was no palpable mass and no palpable axillary adenopathy.  There was some nipple inversion inferolaterally.  Ultrasound showed a 2.6 cm irregular mass-like area with dense posterior acoustic shadowing in the 3 o'clock position, 1 cm from the nipple, but normal appearing left axillary lymph nodes.    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  A second mass noted on ultrasound was also lobular, also low grade.  Both were ER and PR positive at well over 90%.  Both had low proliferation markers at 9%, and both were Her-2 negative by CISH with ratios of 1.6 and 1.24.  In short, both masses were nearly identical.    On June 16th, the patient had bilateral breast MRIs which showed in the left breast at 3 o'clock an irregular area of abnormal enhancement measuring up to 2.7 cm and in the left lower inner quadrant, a second post-biopsy area measuring 1.5 cm.  The difference between these two areas was stated at the conference the morning of the visit to be approximately 5 cm.  Given this data the patient opted for Left mastectomy with sentinel lymph node sampling, performed July 2011 with results as detailed below.  Subsequent history is as detailed below.  INTERVAL HISTORY: Monquie returns for followup of her metastatic breast cancer. She is "doing well".  She is not on standard therapy, but is following a modified vegan diet which now includes some meat (but no dairy), and she still does your and takes walks. She runs through the [employer] and enjoyed that but did not sign up for followup. Work is currently driving her "crazy" but she hopes that we'll slackened up by the end of November.  REVIEW OF SYSTEMS: She denies pain, unusual headaches, visual changes, nausea, vomiting him a dizziness, or gait imbalance. There has been no cough, phlegm production, or pleurisy. There has been no change in bowel or bladder habits. She has mild hot flashes which have not changed. A detailed review of systems today was otherwise stable.   PAST MEDICAL HISTORY: Past Medical History  Diagnosis Date  . SVD (spontaneous vaginal delivery)     x 1  . PONV (postoperative nausea and vomiting)   . Hypothyroidism   . Hyperlipidemia     diet controlled  . Depression     Hx - no current problem  . Chronic kidney disease     Only has right kidney  . GERD (gastroesophageal reflux disease)     diet controlled - no meds  . Arthritis     neck, upper back, shoulders - no meds - yoga  . lt breast ca dx'd 11/2009  . Osteoporosis, unspecified 12/06/2013  1. Osteopenia. 2. Congenital left renal atrophy.  3. History of recurrent UTIs. 4. History of bladder reconstruction for reflux more than 18 years ago. 5. History of depression and anxiety. 6. History  of hypothyroidism.  7. History of "sling procedure".  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    FAMILY HISTORY The patient's father is alive in his early 22s.  He has Alzheimer's disease. The patient's mother was diagnosed with breast cancer at the age of 50.  She died at the age of 85.  The patient's father had one out of two sisters with breast cancer.  There are other breast cancers on the mother's side.  The patient has one brother and one  younger sister, neither with cancer.    GYNECOLOGIC HISTORY:  (Reviewed 12/06/2013) She is GX P1.  That pregnancy was at age 58.  Menarche was at age 58  She was perimenopausal at the time of diagnosis but is now convincingly postmenopausal  SOCIAL HISTORY:  (Reviewed 12/06/2013) She works as a Electrical engineer, part-time and on consultation.  Her husband, [name], is a Architect at [employer].  [name] works as a Pharmacist, hospital in [place].  The patient's son [name] is  in college.  The patient attends [employer].   ADVANCED DIRECTIVES: in place  HEALTH MAINTENANCE: (Updated 12/06/2013)  History  Substance Use Topics  . Smoking status: Never Smoker   . Smokeless tobacco: Never Used  . Alcohol Use: No     Colonoscopy: Not on file  PAP: UTD/May 2015, Dr. Matthew Saras  Bone density: [name] 2014, osteoporosis  Lipid panel: UTD (Not on file)    Allergies  Allergen Reactions  . Adhesive [Tape] Rash    Ok to use paper tape and tegaderm over IV site  . Betadine [Povidone Iodine] Rash  . Mercury Rash  . Sulfa Antibiotics Rash    Current Outpatient Prescriptions  Medication Sig Dispense Refill  . Acetylcysteine (NAC) 600 MG CAPS Take 1 capsule by mouth daily.      Marland Kitchen ascorbic acid (VITAMIN C) 1000 MG tablet Take 1,000 mg by mouth 2 (two) times daily.      Marland Kitchen BIOTIN FORTE PO Take 1 tablet by mouth daily.      . Bromelains 500 MG TABS Take 1 tablet by mouth daily.      . Calcium-Vitamin D-Vitamin K (CALCIUM SOFT CHEWS PO) Take 1 each by mouth 2 (two) times daily. Pt takes 2 capsules twice a day.      . Cholecalciferol (VITAMIN D-3) 5000 UNITS TABS Take 1 tablet by mouth daily.      . Coenzyme Q10 (CO Q-10) 200 MG CAPS Take 1 capsule by mouth daily.      Marland Kitchen FOLIC ACID PO Take 1 tablet by mouth daily.      Nyoka Cowden Tea, Camillia sinensis, (GREEN TEA EXTRACT PO) Take 1 tablet by mouth daily.      Marland Kitchen MAGNESIUM PO Take 1 tablet by mouth daily as needed (leg cramps).      . MULTIPLE  VITAMIN PO Take 1 tablet by mouth daily. Pt takes 2 capsules at each meal. Total of 6 per day.      . niacin 250 MG tablet Take 250 mg by mouth at bedtime.      . OMEGA 3 1200 MG CAPS Take 1 capsule by mouth 2 (two) times daily.      Marland Kitchen OVER THE COUNTER MEDICATION Take 1 tablet by mouth daily. Iodoral Iodine 12.22m supplement      . Probiotic Product (PROBIOTIC DAILY PO) Take 1 tablet by mouth daily.       .Marland Kitchen  Strontium Chloride CRYS Take 300 mg by mouth daily.      Marland Kitchen thyroid (ARMOUR) 60 MG tablet Take 60 mg by mouth daily before breakfast.       No current facility-administered medications for this visit.    OBJECTIVE: Middle aged white woman who appears well  Filed Vitals:   03/18/14 1605  BP: 123/62  Pulse: 63  Temp: 98.1 F (36.7 C)  Resp: 18     Body mass index is 20.89 kg/(m^2).    ECOG FS: 0 Filed Weights   03/18/14 1605  Weight: 110 lb 8 oz (50.122 kg)   Physical Exam: Sclerae unicteric, pupils equal and reactive Oropharynx clear and moist No cervical or supraclavicular adenopathy Lungs no rales or rhonchi Heart regular rate and rhythm Abd soft, nontender, positive bowel sounds MSK no focal spinal tenderness, no upper extremity lymphedema Neuro: nonfocal, well oriented, appropriate affect Breasts: the right breast is unremarkable through the left breast is status post mastectomy. There is no evidence of local recurrence. Left axilla is benign.     LAB RESULTS: Results for EMMANUELLA, MIRANTE (MRN [id number]) as of 03/18/2014 16:29  Ref. Range 03/15/2013 09:19 08/13/2013 09:48 08/20/2013 10:19 11/29/2013 14:19 03/13/2014 12:30  CA 27.29 Latest Range: 0-39 U/mL 31 36 27 36 35  Results for ELSPETH, BLUCHER (MRN [id number]) as of 03/18/2014 16:29  Ref. Range 08/01/2011 14:15 08/20/2013 10:20 11/29/2013 14:19 03/13/2014 12:30  PTH Latest Range: 14-64 pg/mL 38.3 27.1 27.5 24   Results for CHER, FRANZONI (MRN [id number]) as of 03/18/2014 16:29  Ref. Range 08/20/2013 10:20 11/29/2013 14:19  03/13/2014 12:30  CRP Latest Range: <0.60 mg/dL <0.5 <0.5 <0.5   Lab Results  Component Value Date   WBC 5.7 03/13/2014   NEUTROABS 3.9 03/13/2014   HGB 12.3 03/13/2014   HCT 35.9 03/13/2014   MCV 88.9 03/13/2014   PLT 243 03/13/2014      Chemistry      Component Value Date/Time   NA 137 03/13/2014 1230   NA 140 03/23/2012 1557   K 3.9 03/13/2014 1230   K 3.7 03/23/2012 1557   CL 98 12/03/2012 1419   CL 102 03/23/2012 1557   CO2 26 03/13/2014 1230   CO2 28 03/23/2012 1557   BUN 14.0 03/13/2014 1230   BUN 12 03/23/2012 1557   CREATININE 0.8 03/13/2014 1230   CREATININE 0.79 03/23/2012 1557      Component Value Date/Time   CALCIUM 10.1 03/13/2014 1230   CALCIUM 9.8 03/13/2014 1230   CALCIUM 10.1 08/01/2011 1415   ALKPHOS 117 03/13/2014 1230   ALKPHOS 93 03/23/2012 1557   AST 28 03/13/2014 1230   AST 33 03/23/2012 1557   ALT 28 03/13/2014 1230   ALT 33 03/23/2012 1557   BILITOT 0.50 03/13/2014 1230   BILITOT 0.5 03/23/2012 1557      Lab Results  Component Value Date   LABCA2 35 03/13/2014    STUDIES:  Patient declined annual mammography, but has annual thermography. This was last evaluated in mid April 2015, and she is requesting that those results be faxed to our office.  Most recent bone density was obtained under the care of Dr. Matthew Saras at [facility], 06/25/2012, showing osteoporosis with a T score -2.6. This was repeated 12/20/2013, showing a began osteoporosis with T-scores between -2.4 and -2.8.    ASSESSMENT: 58 y.o.  BRCA 1-2 negative [facility] woman  (1) status post left mastectomy and sentinel lymph node sampling in July  2011 for a T1 N1 M1, stage IV invasive lobular breast cancer, grade 1, strongly estrogen and progesterone receptor-positive, HER2 negative with MIB-1 of 9% and no HER2 amplification,   (2) with multiple sclerotic bone lesions at presentation seen only on CT scan (not on bone scan or PET scan), but  with biopsy-proven metastatic  disease to bone; and an elevated CA 27.29 at presentation,   (3) Oncotype recurrence score of 4, predicts a good response to antiestrogens.  (4) Systemic treatment has consisted of  a) tamoxifen with evidence of response but poor tolerance  b) letrozole starting August 2012, discontinued October 2014 per patient   (5) single functioning kidney  (6) status post bilateral salpingo-oophorectomy 01/24/2013, with benign pathology  (7) osteoporosis; the patient refuses bisphosphonate therapy; started [facility] February 2014.  Scheduled for next bone density in July 2015 under the care of Dr. Matthew Saras.  PLAN:  I spent approximately 45 minutes with Kavita going over her situation. She is clinically stable. She is on no treatment for her breast cancer--which I mean no biological or standard treatment. She is in a very good diet and she is doing fair on her exercise program. She is pretty much asymptomatic from her tumor.  I suggested we check a CT of the chest sometime later this year. She is going to discuss that with her husband and let me know. I also suggested she participated in our "intimacy and pelvic health" program. She has the appropriate information.  She refuses mammography but does receive thermography. She has requested that the thermography versus and there reports to me but they have not done this. She will bring me the reports of the next visit.she is interested in following the C-reactive protein CA 27-29 and parathyroid hormone levels that I am glad to facilitate that for her.  Assuming we are not doing a CT scan later this year, or that if we do it does not have any significant news, we will repeat lab work in 3 months and she will have lab work and a visit in 6 months of course she can call me for any problems that may develop before that. Chauncey Cruel, MD     03/18/2014

## 2014-03-19 NOTE — Addendum Note (Signed)
Addended by: Laureen Abrahams on: 03/19/2014 05:54 PM   Modules accepted: Medications

## 2014-03-20 ENCOUNTER — Telehealth: Payer: Self-pay | Admitting: Oncology

## 2014-03-20 NOTE — Telephone Encounter (Signed)
Lft mg for pt confirming labs/ov per 10/06 POF, mailed out sch..... KJ

## 2014-06-09 ENCOUNTER — Other Ambulatory Visit (HOSPITAL_BASED_OUTPATIENT_CLINIC_OR_DEPARTMENT_OTHER): Payer: 59

## 2014-06-09 DIAGNOSIS — C7951 Secondary malignant neoplasm of bone: Secondary | ICD-10-CM

## 2014-06-09 DIAGNOSIS — C50919 Malignant neoplasm of unspecified site of unspecified female breast: Secondary | ICD-10-CM

## 2014-06-09 DIAGNOSIS — C50312 Malignant neoplasm of lower-inner quadrant of left female breast: Secondary | ICD-10-CM

## 2014-06-09 LAB — CBC WITH DIFFERENTIAL/PLATELET
BASO%: 0.1 % (ref 0.0–2.0)
Basophils Absolute: 0 10*3/uL (ref 0.0–0.1)
EOS%: 2 % (ref 0.0–7.0)
Eosinophils Absolute: 0.1 10*3/uL (ref 0.0–0.5)
HCT: 41.4 % (ref 34.8–46.6)
HGB: 13.9 g/dL (ref 11.6–15.9)
LYMPH%: 18 % (ref 14.0–49.7)
MCH: 30.3 pg (ref 25.1–34.0)
MCHC: 33.6 g/dL (ref 31.5–36.0)
MCV: 90.2 fL (ref 79.5–101.0)
MONO#: 0.3 10*3/uL (ref 0.1–0.9)
MONO%: 3.9 % (ref 0.0–14.0)
NEUT#: 5.4 10*3/uL (ref 1.5–6.5)
NEUT%: 76 % (ref 38.4–76.8)
Platelets: 277 10*3/uL (ref 145–400)
RBC: 4.59 10*6/uL (ref 3.70–5.45)
RDW: 12.4 % (ref 11.2–14.5)
WBC: 7.2 10*3/uL (ref 3.9–10.3)
lymph#: 1.3 10*3/uL (ref 0.9–3.3)

## 2014-06-09 LAB — COMPREHENSIVE METABOLIC PANEL (CC13)
ALT: 30 U/L (ref 0–55)
AST: 26 U/L (ref 5–34)
Albumin: 4.2 g/dL (ref 3.5–5.0)
Alkaline Phosphatase: 115 U/L (ref 40–150)
Anion Gap: 10 mEq/L (ref 3–11)
BUN: 9.9 mg/dL (ref 7.0–26.0)
CALCIUM: 10.3 mg/dL (ref 8.4–10.4)
CHLORIDE: 102 meq/L (ref 98–109)
CO2: 28 meq/L (ref 22–29)
Creatinine: 0.9 mg/dL (ref 0.6–1.1)
EGFR: 70 mL/min/{1.73_m2} — ABNORMAL LOW (ref >90)
GLUCOSE: 82 mg/dL (ref 70–140)
Potassium: 3.9 mEq/L (ref 3.5–5.1)
SODIUM: 140 meq/L (ref 136–145)
TOTAL PROTEIN: 7.5 g/dL (ref 6.4–8.3)
Total Bilirubin: 0.55 mg/dL (ref 0.20–1.20)

## 2014-06-10 LAB — PTH, INTACT AND CALCIUM
Calcium: 10.2 mg/dL (ref 8.4–10.5)
PTH: 30 pg/mL (ref 14–64)

## 2014-06-10 LAB — C-REACTIVE PROTEIN: CRP: <0.5 mg/dL (ref <0.60)

## 2014-06-10 LAB — CANCER ANTIGEN 27.29: CA 27.29: 36 U/mL (ref 0–39)

## 2014-07-22 ENCOUNTER — Telehealth: Payer: Self-pay | Admitting: *Deleted

## 2014-07-22 NOTE — Telephone Encounter (Signed)
Voicemail left by patient today requesting "test results she requested a few weeks ago.  I'd like my own copy but you can e-mail or send them to [facility] [place]  I can provide the number if needed."  Called patient at number provided.  Message left on voicermail identified as Sonia Hill with instructions to come to [facility] With picture ID to sign to pick up whatever records and results she would like.

## 2014-09-11 ENCOUNTER — Other Ambulatory Visit: Payer: Self-pay | Admitting: *Deleted

## 2014-09-11 DIAGNOSIS — C7951 Secondary malignant neoplasm of bone: Principal | ICD-10-CM

## 2014-09-11 DIAGNOSIS — C50919 Malignant neoplasm of unspecified site of unspecified female breast: Secondary | ICD-10-CM

## 2014-09-11 DIAGNOSIS — C50312 Malignant neoplasm of lower-inner quadrant of left female breast: Secondary | ICD-10-CM

## 2014-09-15 ENCOUNTER — Other Ambulatory Visit (HOSPITAL_BASED_OUTPATIENT_CLINIC_OR_DEPARTMENT_OTHER): Payer: 59

## 2014-09-15 DIAGNOSIS — C7951 Secondary malignant neoplasm of bone: Secondary | ICD-10-CM

## 2014-09-15 DIAGNOSIS — C50312 Malignant neoplasm of lower-inner quadrant of left female breast: Secondary | ICD-10-CM

## 2014-09-15 DIAGNOSIS — C50919 Malignant neoplasm of unspecified site of unspecified female breast: Secondary | ICD-10-CM

## 2014-09-15 LAB — CBC WITH DIFFERENTIAL/PLATELET
BASO%: 0.3 % (ref 0.0–2.0)
Basophils Absolute: 0 10*3/uL (ref 0.0–0.1)
EOS%: 0.3 % (ref 0.0–7.0)
Eosinophils Absolute: 0 10*3/uL (ref 0.0–0.5)
HCT: 37.8 % (ref 34.8–46.6)
HGB: 12.6 g/dL (ref 11.6–15.9)
LYMPH%: 14.2 % (ref 14.0–49.7)
MCH: 29.7 pg (ref 25.1–34.0)
MCHC: 33.3 g/dL (ref 31.5–36.0)
MCV: 89.3 fL (ref 79.5–101.0)
MONO#: 0.4 10*3/uL (ref 0.1–0.9)
MONO%: 5.2 % (ref 0.0–14.0)
NEUT#: 5.9 10*3/uL (ref 1.5–6.5)
NEUT%: 80 % — ABNORMAL HIGH (ref 38.4–76.8)
Platelets: 291 10*3/uL (ref 145–400)
RBC: 4.23 10*6/uL (ref 3.70–5.45)
RDW: 13.5 % (ref 11.2–14.5)
WBC: 7.4 10*3/uL (ref 3.9–10.3)
lymph#: 1 10*3/uL (ref 0.9–3.3)

## 2014-09-15 LAB — COMPREHENSIVE METABOLIC PANEL (CC13)
ALT: 23 U/L (ref 0–55)
AST: 22 U/L (ref 5–34)
Albumin: 3.9 g/dL (ref 3.5–5.0)
Alkaline Phosphatase: 91 U/L (ref 40–150)
Anion Gap: 11 mEq/L (ref 3–11)
BILIRUBIN TOTAL: 0.49 mg/dL (ref 0.20–1.20)
BUN: 14.5 mg/dL (ref 7.0–26.0)
CHLORIDE: 99 meq/L (ref 98–109)
CO2: 25 meq/L (ref 22–29)
Calcium: 9.5 mg/dL (ref 8.4–10.4)
Creatinine: 0.8 mg/dL (ref 0.6–1.1)
EGFR: 80 mL/min/{1.73_m2} — ABNORMAL LOW (ref >90)
Glucose: 104 mg/dl (ref 70–140)
POTASSIUM: 4.4 meq/L (ref 3.5–5.1)
SODIUM: 135 meq/L — AB (ref 136–145)
Total Protein: 6.7 g/dL (ref 6.4–8.3)

## 2014-09-16 LAB — CANCER ANTIGEN 27.29: CA 27.29: 35 U/mL (ref 0–39)

## 2014-09-22 ENCOUNTER — Telehealth: Payer: Self-pay | Admitting: Oncology

## 2014-09-22 ENCOUNTER — Ambulatory Visit (HOSPITAL_BASED_OUTPATIENT_CLINIC_OR_DEPARTMENT_OTHER): Payer: 59 | Admitting: Oncology

## 2014-09-22 VITALS — BP 123/60 | HR 67 | Temp 97.9°F | Resp 18 | Ht 61.0 in | Wt 109.7 lb

## 2014-09-22 DIAGNOSIS — M810 Age-related osteoporosis without current pathological fracture: Secondary | ICD-10-CM

## 2014-09-22 DIAGNOSIS — C50919 Malignant neoplasm of unspecified site of unspecified female breast: Secondary | ICD-10-CM

## 2014-09-22 DIAGNOSIS — C7951 Secondary malignant neoplasm of bone: Secondary | ICD-10-CM | POA: Diagnosis not present

## 2014-09-22 DIAGNOSIS — N189 Chronic kidney disease, unspecified: Secondary | ICD-10-CM | POA: Diagnosis not present

## 2014-09-22 DIAGNOSIS — C50312 Malignant neoplasm of lower-inner quadrant of left female breast: Secondary | ICD-10-CM

## 2014-09-22 NOTE — Telephone Encounter (Signed)
Left message to confirm appointment for October. Also informed patient to call our office to set up lab work this week. Mailed calendar for October.

## 2014-09-22 NOTE — Progress Notes (Signed)
ID: Sonia Hill   DOB: [birth year]  MR#: [id number]  FOY#:[id number]  PCP: Mylinda Latina, MD GYN: Molli Posey SU: Donnie Mesa OTHER MD: Minette Brine, Carvel Getting [phone])  BREAST CANCER HISTORY:  From the earlier intake note:  Shaunna had screening mammography on 11/10/2009 showing very dense tissue with a possible distortion in the left breast.  Digital mammography on June 10th showed a 1.5 cm cluster of microcalcifications in the lower inner portion of the breast.  On physical exam, there was no palpable mass and no palpable axillary adenopathy.  There was some nipple inversion inferolaterally.  Ultrasound showed a 2.6 cm irregular mass-like area with dense posterior acoustic shadowing in the 3 o'clock position, 1 cm from the nipple, but normal appearing left axillary lymph nodes.    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  A second mass noted on ultrasound was also lobular, also low grade.  Both were ER and PR positive at well over 90%.  Both had low proliferation markers at 9%, and both were Her-2 negative by CISH with ratios of 1.6 and 1.24.  In short, both masses were nearly identical.    On June 16th, the patient had bilateral breast MRIs which showed in the left breast at 3 o'clock an irregular area of abnormal enhancement measuring up to 2.7 cm and in the left lower inner quadrant, a second post-biopsy area measuring 1.5 cm.  The difference between these two areas was stated at the conference the morning of the visit to be approximately 5 cm.  Given this data the patient opted for Left mastectomy with sentinel lymph node sampling, performed July 2011 with results as detailed below.  Subsequent history is as detailed below.  INTERVAL HISTORY: Brystol returns for followup of her metastatic breast cancer. The interval history  is generally unremarkable. She had an episode of "flu", which was treated symptomatically, and then her back "went out". (She was sleeping in a different bed from her usual to keep her husband from catching the flu and the mattress there was too soft).  REVIEW OF SYSTEMS: Aside from the problems just mentioned, she tells me her thyroid has been "low", and that she is also having problems with her adrenal glands not working well. She has pelvic discomfort, including dysuria, no hematuria or urgency. She wonders if this is due to vaginal atrophy. She tells me she had a urinalysis which was unremarkable. She had a thermogram which was "okay" and apparently included the whole upper body. I don't believe I have seen those results. She has some joint inflammation and had all her mercury fillings removed. She has some ringing in her years. She feels short of breath particularly when walking up slopes. Her appetite is poor. She has been moderately constipated after the flu episode. She feels forgetful, anxious, and depressed. A detailed review today was otherwise stable  PAST MEDICAL HISTORY: Past Medical History  Diagnosis Date  . SVD (spontaneous vaginal delivery)     x 1  . PONV (postoperative nausea and vomiting)   . Hypothyroidism   . Hyperlipidemia     diet controlled  . Depression     Hx - no current problem  . Chronic kidney disease     Only has right kidney  . GERD (gastroesophageal reflux disease)     diet controlled - no meds  .  Arthritis     neck, upper back, shoulders - no meds - yoga  . lt breast ca dx'd 11/2009  . Osteoporosis, unspecified 12/06/2013  1. Osteopenia. 2. Congenital left renal atrophy.  3. History of recurrent UTIs. 4. History of bladder reconstruction for reflux more than 18 years ago. 5. History of depression and anxiety. 6. History of hypothyroidism.  7. History of "sling procedure".  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    FAMILY HISTORY The patient's father is alive in his early 71s.  He has Alzheimer's disease. The patient's mother was diagnosed with breast cancer at the age of 44.  She died at the age of 39.  The patient's father had one out of two sisters with breast cancer.  There are other breast cancers on the mother's side.  The patient has one brother and one younger sister, neither with cancer.    GYNECOLOGIC HISTORY:  (Reviewed 12/06/2013) She is GX P1.  That pregnancy was at age 40.  Menarche was at age 38.  She was perimenopausal at the time of diagnosis but is now convincingly postmenopausal  SOCIAL HISTORY:  (Reviewed 12/06/2013) She works as a Electrical engineer, part-time and on consultation.  Her husband, [name], is a Architect at [employer].  [name] works as a Pharmacist, hospital in [place].  The patient's son [name] is  in college.  The patient attends [employer].   ADVANCED DIRECTIVES: in place  HEALTH MAINTENANCE: (Updated 12/06/2013)  History  Substance Use Topics  . Smoking status: Never Smoker   . Smokeless tobacco: Never Used  . Alcohol Use: No     Colonoscopy: Not on file  PAP: UTD/May 2015, Dr. Matthew Saras  Bone density: [name] 2014, osteoporosis  Lipid panel: UTD (Not on file)    Allergies  Allergen Reactions  . Adhesive [Tape] Rash    Ok to use paper tape and tegaderm over IV site  . Betadine [Povidone Iodine] Rash  . Mercury Rash  . Sulfa Antibiotics Rash    Current Outpatient Prescriptions  Medication Sig Dispense Refill  . Acetylcysteine (NAC) 600 MG CAPS Take 1 capsule by mouth daily.    Marland Kitchen ascorbic acid (VITAMIN C) 1000 MG tablet Take 1,000 mg by mouth 3 (three) times daily.     Marland Kitchen BIOTIN FORTE PO Take 1 tablet by mouth daily.    . Bromelains 500 MG TABS Take 1 tablet by mouth daily.    . Calcium-Vitamin D-Vitamin K (CALCIUM SOFT CHEWS PO) Take 1 each by mouth 2 (two) times daily. Pt takes 2 capsules twice a day.    .  Cholecalciferol (VITAMIN D-3) 5000 UNITS TABS Take 1 tablet by mouth daily.    . Coenzyme Q10 (CO Q-10) 200 MG CAPS Take 1 capsule by mouth daily.    Marland Kitchen FOLIC ACID PO Take 1 tablet by mouth daily.    Nyoka Cowden Tea, Camillia sinensis, (GREEN TEA EXTRACT PO) Take 1 tablet by mouth daily.    Marland Kitchen MAGNESIUM PO Take 1 tablet by mouth daily as needed (leg cramps).    . MULTIPLE VITAMIN PO Take 1 tablet by mouth daily. Pt takes 2 capsules at each meal. Total of 6 per day.    . niacin 250 MG tablet Take 250 mg by mouth at bedtime.    . OMEGA 3 1200 MG CAPS Take 1 capsule by mouth 2 (two) times daily.    Marland Kitchen OVER THE  COUNTER MEDICATION Take 1 tablet by mouth daily. Iodoral Iodine 12.23m supplement    . Probiotic Product (PROBIOTIC DAILY PO) Take 1 tablet by mouth daily.     . Strontium Chloride CRYS Take 300 mg by mouth daily.    .Marland Kitchenthyroid (ARMOUR) 30 MG tablet Take 30 mg by mouth daily before lunch. Pt takes an am dose and lunch dose    . thyroid (ARMOUR) 60 MG tablet Take 60 mg by mouth daily before breakfast.     No current facility-administered medications for this visit.    OBJECTIVE: Middle aged white woman will be her stated age F85Vitals:   09/22/14 1141  BP: 123/60  Pulse: 67  Temp: 97.9 F (36.6 C)  Resp: 18     Body mass index is 20.74 kg/(m^2).    ECOG FS: 1 Filed Weights   09/22/14 1141  Weight: 109 lb 11.2 oz (49.76 kg)   Sclerae unicteric, EOMs intact Oropharynx clear and moist-- no thrush or other lesions No cervical or supraclavicular adenopathy Lungs no rales or rhonchi Heart regular rate and rhythm Abd soft, nontender, positive bowel sounds MSK no focal spinal tenderness, no upper extremity lymphedema Neuro: nonfocal, well oriented, appropriate affect Breasts: Status post left mastectomy with no evidence of local recurrence. Left axilla is benign.      LAB RESULTS: Results for NAZALYNN, MAXIM(MRN [id number] as of 09/22/2014 19:43  Ref. Range 08/20/2013 10:19  11/29/2013 14:19 03/13/2014 12:30 06/09/2014 11:52 09/15/2014 13:26  CA 27.29 Latest Range: 0-39 U/mL 27 36 35 36 35      Lab Results  Component Value Date   WBC 7.4 09/15/2014   NEUTROABS 5.9 09/15/2014   HGB 12.6 09/15/2014   HCT 37.8 09/15/2014   MCV 89.3 09/15/2014   PLT 291 09/15/2014      Chemistry      Component Value Date/Time   NA 135* 09/15/2014 1326   NA 140 03/23/2012 1557   K 4.4 09/15/2014 1326   K 3.7 03/23/2012 1557   CL 98 12/03/2012 1419   CL 102 03/23/2012 1557   CO2 25 09/15/2014 1326   CO2 28 03/23/2012 1557   BUN 14.5 09/15/2014 1326   BUN 12 03/23/2012 1557   CREATININE 0.8 09/15/2014 1326   CREATININE 0.79 03/23/2012 1557      Component Value Date/Time   CALCIUM 9.5 09/15/2014 1326   CALCIUM 10.2 06/09/2014 1152   CALCIUM 10.1 08/01/2011 1415   ALKPHOS 91 09/15/2014 1326   ALKPHOS 93 03/23/2012 1557   AST 22 09/15/2014 1326   AST 33 03/23/2012 1557   ALT 23 09/15/2014 1326   ALT 33 03/23/2012 1557   BILITOT 0.49 09/15/2014 1326   BILITOT 0.5 03/23/2012 1557      Lab Results  Component Value Date   LABCA2 35 09/15/2014   Additional lab work obtained by Dr. BJulien Nordmann11/16/2015 is separately scanned. The CBC and see metastases were within normal limits. Otherwise are as scanned  STUDIES: According to the patient's thermography of the upper body showed mostly jaw inflammation.   Most recent bone density was obtained under the care of [facility] 06/25/2012, showing osteoporosis with a T score -2.6. This was repeated 12/20/2013, showing a began osteoporosis with T-scores between -2.4 and -2.8.    ASSESSMENT: 59y.o.  [facility]  (1) status post left mastectomy and sentinel lymph node sampling in July 2011 for a T1 N1 M1,  stage IV invasive lobular breast cancer, grade 1, strongly estrogen and progesterone receptor-positive, HER2 negative with MIB-1 of 9% and no HER2 amplification,    (2) with multiple sclerotic bone lesions at presentation seen only on CT scan (not on bone scan or PET scan), but  with biopsy-proven metastatic disease to bone; and an elevated CA 27.29 at presentation,   (3) Oncotype recurrence score of 4, predicts a good response to antiestrogens.  (4) Systemic treatment has consisted of  a) tamoxifen with evidence of response but poor tolerance  b) letrozole starting August 2012, discontinued October 2014 per patient   (5) single functioning kidney  (6) status post bilateral salpingo-oophorectomy 01/24/2013, with benign pathology  (7) osteoporosis; the patient refuses bisphosphonate therapy; started [facility] February 2014.    PLAN:  [place] is now agreeable to a CT scan of the chest and bone scan and I am putting those in for her in May. She will see me within 2 weeks of those tests to discuss results.  She is very concerned about her pelvic discomfort. She tells me she had a urine test which was negative, but we're going to repeat that today just to make sure she is not dealing with something as simple as an infection. She is planning to see Dr. Matthew Saras soon and discuss the [facility] treatments.  She is interested in additional lab work but she will be seeing Dr. Julien Nordmann soon and I don't want to duplicate her tests unnecessarily. She can help [place] coordinate labs in whichever way is best for [place].  Assuming the CT scan and bone scan are stable, she will return to see [place] in 6 months after the late May visit. Nicolasa has a good understanding of this plan. She knows to call for any new problems that may develop before her next visit here.  Chauncey Cruel, MD     09/22/2014

## 2014-10-30 ENCOUNTER — Encounter: Payer: Self-pay | Admitting: Oncology

## 2014-11-06 ENCOUNTER — Other Ambulatory Visit: Payer: Self-pay | Admitting: Obstetrics and Gynecology

## 2014-11-11 LAB — CYTOLOGY - PAP

## 2014-11-17 ENCOUNTER — Telehealth: Payer: Self-pay | Admitting: *Deleted

## 2014-11-17 NOTE — Telephone Encounter (Signed)
VM message from patient received @ 4:23 pm regarding Chest CT Scan scheduled for 11/24/14. She needs an appt to see Dr. [name] to discuss results of CT scan within 2 weeks of scan.

## 2014-11-18 ENCOUNTER — Other Ambulatory Visit: Payer: Self-pay | Admitting: Oncology

## 2014-11-18 ENCOUNTER — Telehealth: Payer: Self-pay | Admitting: Oncology

## 2014-11-18 NOTE — Telephone Encounter (Signed)
Called and left a message with her 6/22 appointment

## 2014-11-18 NOTE — Telephone Encounter (Signed)
POF entered for June 22 at 8 AM

## 2014-11-24 ENCOUNTER — Encounter (HOSPITAL_COMMUNITY): Payer: Self-pay

## 2014-11-24 ENCOUNTER — Ambulatory Visit (HOSPITAL_COMMUNITY)
Admission: RE | Admit: 2014-11-24 | Discharge: 2014-11-24 | Disposition: A | Discharge status: Home / Self Care | Payer: 59 | Source: Ambulatory Visit | Attending: Oncology | Admitting: Oncology

## 2014-11-24 ENCOUNTER — Ambulatory Visit (HOSPITAL_COMMUNITY): Payer: 59

## 2014-11-24 DIAGNOSIS — C7951 Secondary malignant neoplasm of bone: Secondary | ICD-10-CM | POA: Diagnosis not present

## 2014-11-24 DIAGNOSIS — C50312 Malignant neoplasm of lower-inner quadrant of left female breast: Secondary | ICD-10-CM | POA: Diagnosis not present

## 2014-11-24 DIAGNOSIS — M810 Age-related osteoporosis without current pathological fracture: Secondary | ICD-10-CM

## 2014-11-24 DIAGNOSIS — C50919 Malignant neoplasm of unspecified site of unspecified female breast: Secondary | ICD-10-CM

## 2014-11-24 LAB — POCT I-STAT CREATININE: Creatinine, Ser: 1 mg/dL (ref 0.44–1.00)

## 2014-11-24 MED ORDER — IOHEXOL 300 MG/ML  SOLN
100.0000 mL | Freq: Once | INTRAMUSCULAR | Status: AC | PRN
  Administered 2014-11-24: 80 mL via INTRAVENOUS

## 2014-12-03 ENCOUNTER — Telehealth: Payer: Self-pay | Admitting: Oncology

## 2014-12-03 ENCOUNTER — Other Ambulatory Visit: Payer: Self-pay | Admitting: *Deleted

## 2014-12-03 ENCOUNTER — Telehealth: Payer: Self-pay | Admitting: *Deleted

## 2014-12-03 ENCOUNTER — Ambulatory Visit (HOSPITAL_BASED_OUTPATIENT_CLINIC_OR_DEPARTMENT_OTHER): Payer: 59 | Admitting: Oncology

## 2014-12-03 VITALS — BP 132/63 | HR 69 | Temp 98.2°F | Resp 18 | Ht 61.0 in | Wt 108.9 lb

## 2014-12-03 DIAGNOSIS — C50312 Malignant neoplasm of lower-inner quadrant of left female breast: Secondary | ICD-10-CM | POA: Diagnosis not present

## 2014-12-03 DIAGNOSIS — C50919 Malignant neoplasm of unspecified site of unspecified female breast: Secondary | ICD-10-CM

## 2014-12-03 DIAGNOSIS — C7951 Secondary malignant neoplasm of bone: Secondary | ICD-10-CM | POA: Diagnosis not present

## 2014-12-03 DIAGNOSIS — N951 Menopausal and female climacteric states: Secondary | ICD-10-CM

## 2014-12-03 DIAGNOSIS — Z170 Estrogen receptor positive status [ER+]: Secondary | ICD-10-CM

## 2014-12-03 MED ORDER — GABAPENTIN 100 MG PO CAPS: ORAL_CAPSULE | ORAL | Status: DC

## 2014-12-03 NOTE — Progress Notes (Signed)
ID: Sonia Hill   DOB: 03-20-1956  MR#: [id number]  DEY#:[id number]  PCP: Mylinda Latina, MD GYN: Molli Posey SU: Donnie Mesa OTHER MD: Minette Brine, Carvel Getting [phone])  CHIEF COMPLAINT: Metastatic breast cancer, estrogen receptor positive  CURRENT TREATMENT: Observation  BREAST CANCER HISTORY:  From the initial intake note:  Gicela had screening mammography on 11/10/2009 showing very dense tissue with a possible distortion in the left breast.  Digital mammography on June 10th showed a 1.5 cm cluster of microcalcifications in the lower inner portion of the breast.  On physical exam, there was no palpable mass and no palpable axillary adenopathy.  There was some nipple inversion inferolaterally.  Ultrasound showed a 2.6 cm irregular mass-like area with dense posterior acoustic shadowing in the 3 o'clock position, 1 cm from the nipple, but normal appearing left axillary lymph nodes.    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  A second mass noted on ultrasound was also lobular, also low grade.  Both were ER and PR positive at well over 90%.  Both had low proliferation markers at 9%, and both were Her-2 negative by CISH with ratios of 1.6 and 1.24.  In short, both masses were nearly identical.    On June 16th, the patient had bilateral breast MRIs which showed in the left breast at 3 o'clock an irregular area of abnormal enhancement measuring up to 2.7 cm and in the left lower inner quadrant, a second post-biopsy area measuring 1.5 cm.  The difference between these two areas was stated at the conference the morning of the visit to be approximately 5 cm.  Given this data the patient opted for Left mastectomy with sentinel lymph node sampling, performed July 2011 with results as detailed below.  Subsequent history is as detailed  below.  INTERVAL HISTORY: [name] returns for followup of her metastatic breast cancer accompanied by her husband Sonia Hill. She has had some symptoms that concerned her that perhaps her cancer was becoming active so she agreed to a CT scan of the chest which was obtained 11/24/2014. This does show stable sclerotic lesions in the left posterior eighth rib , which is really unchanged from 5 years ago. There were no other findings to suggestrecurring malignancy.  This is very favorable.--She was also scheduled for a bone scan, but decided against it for financial reasons  REVIEW OF SYSTEMS: She has lost approximately 6 pounds in the last 3 months. She has significant sleep problems. Her mind is just working all night. Her body rests but she gets really no relief. She is careful not to sleep during the day. She does have more hot flashes recently. Sometimes this wakes her up. They have liberalize her diet slightly, including meats now, to see if they can forestall further weight loss. She does admit to anxiety and depression problems. She complains of constipation. She exercises by walking 3-4 times a week about 20-30 minutes at a time. She is not doing gym exercises even though she did have a Physiological scientist for a time. A detailed review of systems today was otherwise stable.   PAST MEDICAL HISTORY: Past Medical History  Diagnosis Date  . SVD (spontaneous vaginal delivery)     x 1  . PONV (postoperative nausea and vomiting)   . Hypothyroidism   . Hyperlipidemia     diet controlled  . Depression  Hx - no current problem  . Chronic kidney disease     Only has right kidney  . GERD (gastroesophageal reflux disease)     diet controlled - no meds  . Arthritis     neck, upper back, shoulders - no meds - yoga  . lt breast ca dx'd 11/2009  . Osteoporosis, unspecified 12/06/2013  1. Osteopenia. 2. Congenital left renal atrophy.  3. History of recurrent UTIs. 4. History of bladder reconstruction for  reflux more than 18 years ago. 5. History of depression and anxiety. 6. History of hypothyroidism.  7. History of "sling procedure".  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    FAMILY HISTORY The patient's father is alive in his early 32s.  He has Alzheimer's disease. The patient's mother was diagnosed with breast cancer at the age of 59.  She died at the age of 71.  The patient's father had one out of two sisters with breast cancer.  There are other breast cancers on the mother's side.  The patient has one brother and one younger sister, neither with cancer.    GYNECOLOGIC HISTORY:  (Reviewed 12/06/2013) She is GX P1.  That pregnancy was at age 53.  Menarche was at age 49.  She was perimenopausal at the time of diagnosis but is now convincingly postmenopausal  SOCIAL HISTORY:  (Reviewed 12/06/2013) She works as a Electrical engineer, part-time and on consultation.  Her husband, Sonia Hill, is a Architect at [employer].  [name] works as a Pharmacist, hospital in [place].  The patient's son [name] is  in college.  The patient attends [employer].   ADVANCED DIRECTIVES: in place  HEALTH MAINTENANCE: (Updated 12/06/2013)  History  Substance Use Topics  . Smoking status: Never Smoker   . Smokeless tobacco: Never Used  . Alcohol Use: No     Colonoscopy: Not on file  PAP: UTD/May 2015, Dr. Matthew Saras  Bone density: [name] 2014, osteoporosis  Lipid panel: UTD (Not on file)    Allergies  Allergen Reactions  . Adhesive [Tape] Rash    Ok to use paper tape and tegaderm over IV site  . Betadine [Povidone Iodine] Rash  . Mercury Rash  . Sulfa Antibiotics Rash    Current Outpatient Prescriptions  Medication Sig Dispense Refill  . Acetylcysteine (NAC) 600 MG CAPS Take 1 capsule by mouth daily.    Marland Kitchen ascorbic acid (VITAMIN C) 1000 MG tablet Take 1,000 mg by mouth 3 (three) times daily.     Marland Kitchen BIOTIN FORTE PO Take 1 tablet  by mouth daily.    . Bromelains 500 MG TABS Take 1 tablet by mouth daily.    . Calcium-Vitamin D-Vitamin K (CALCIUM SOFT CHEWS PO) Take 1 each by mouth 2 (two) times daily. Pt takes 2 capsules twice a day.    . Cholecalciferol (VITAMIN D-3) 5000 UNITS TABS Take 1 tablet by mouth daily.    . Coenzyme Q10 (CO Q-10) 200 MG CAPS Take 1 capsule by mouth daily.    Marland Kitchen FOLIC ACID PO Take 1 tablet by mouth daily.    Nyoka Cowden Tea, Camillia sinensis, (GREEN TEA EXTRACT PO) Take 1 tablet by mouth daily.    Marland Kitchen MAGNESIUM PO Take 1 tablet by mouth daily as needed (leg cramps).    . MULTIPLE VITAMIN PO Take 1 tablet by mouth daily. Pt takes 2 capsules at each meal. Total of 6 per day.    Marland Kitchen  niacin 250 MG tablet Take 250 mg by mouth at bedtime.    . OMEGA 3 1200 MG CAPS Take 1 capsule by mouth 2 (two) times daily.    Marland Kitchen OVER THE COUNTER MEDICATION Take 1 tablet by mouth daily. Iodoral Iodine 12.22m supplement    . Probiotic Product (PROBIOTIC DAILY PO) Take 1 tablet by mouth daily.     . Strontium Chloride CRYS Take 300 mg by mouth daily.    .Marland Kitchenthyroid (ARMOUR) 30 MG tablet Take 30 mg by mouth daily before lunch. Pt takes an am dose and lunch dose    . thyroid (ARMOUR) 60 MG tablet Take 60 mg by mouth daily before breakfast.     No current facility-administered medications for this visit.    OBJECTIVE: Middle aged white woman in no acute distress  Filed Vitals:   12/03/14 0812  BP: 132/63  Pulse: 69  Temp: 98.2 F (36.8 C)  Resp: 18     Body mass index is 20.59 kg/(m^2).    ECOG FS: 0 Filed Weights   12/03/14 0812  Weight: 108 lb 14.4 oz (49.397 kg)   Sclerae unicteric, pupils round and equal Oropharynx clear and moist-- no thrush or other lesions No cervical or supraclavicular adenopathy Lungs no rales or rhonchi Heart regular rate and rhythm Abd soft, nontender, positive bowel sounds MSK no focal spinal tenderness, no upper extremity lymphedema Neuro: nonfocal, well oriented, appropriate  affect Breasts: The right breast is unremarkable. The left breast is status post mastectomy. There is no evidence of chest wall recurrence. Left axilla is benign.       LAB RESULTS: patient obtains lab work through her primary care physician  Results for NALLEA, KASSNER(MRN [id number] as of 09/22/2014 19:43  Ref. Range 08/20/2013 10:19 11/29/2013 14:19 03/13/2014 12:30 06/09/2014 11:52 09/15/2014 13:26  CA 27.29 Latest Range: 0-39 U/mL 27 36 35 36 35      Lab Results  Component Value Date   WBC 7.4 09/15/2014   NEUTROABS 5.9 09/15/2014   HGB 12.6 09/15/2014   HCT 37.8 09/15/2014   MCV 89.3 09/15/2014   PLT 291 09/15/2014      Chemistry      Component Value Date/Time   NA 135* 09/15/2014 1326   NA 140 03/23/2012 1557   K 4.4 09/15/2014 1326   K 3.7 03/23/2012 1557   CL 98 12/03/2012 1419   CL 102 03/23/2012 1557   CO2 25 09/15/2014 1326   CO2 28 03/23/2012 1557   BUN 14.5 09/15/2014 1326   BUN 12 03/23/2012 1557   CREATININE 1.00 11/24/2014 1736   CREATININE 0.8 09/15/2014 1326      Component Value Date/Time   CALCIUM 9.5 09/15/2014 1326   CALCIUM 10.2 06/09/2014 1152   CALCIUM 10.1 08/01/2011 1415   ALKPHOS 91 09/15/2014 1326   ALKPHOS 93 03/23/2012 1557   AST 22 09/15/2014 1326   AST 33 03/23/2012 1557   ALT 23 09/15/2014 1326   ALT 33 03/23/2012 1557   BILITOT 0.49 09/15/2014 1326   BILITOT 0.5 03/23/2012 1557      Lab Results  Component Value Date   LABCA2 35 09/15/2014    STUDIES: Repeat thermography to be scheduled in the fall, according to patient. She refuses mammography.  Most recent    ASSESSMENT: 59y.o.  [facility]  (1) status post left mastectomy and sentinel lymph node sampling in July 2011 for a T1 N1 M1, stage IV invasive lobular  breast cancer, grade 1, strongly estrogen and progesterone receptor-positive, HER2 negative with MIB-1 of 9% and no HER2 amplification,   (2) with multiple sclerotic bone lesions at  presentation seen only on CT scan (not on bone scan or PET scan), but  with biopsy-proven metastatic disease to bone; and an elevated CA 27.29 at presentation,   (3) Oncotype recurrence score of 4, predicts a good response to antiestrogens.  (4) Systemic treatment has consisted of  a) tamoxifen with evidence of response but poor tolerance  b) letrozole starting August 2012, discontinued October 2014 per patient   (5) single functioning kidney  (6) status post bilateral salpingo-oophorectomy 01/24/2013, with benign pathology  (7) osteoporosis; the patient refuses bisphosphonate therapy; started [facility] February 2014.    bone density was obtained under the care of Dr. Matthew Saras at [facility], 06/25/2012, showing osteoporosis with a T score -2.6. This was repeated 12/20/2013, showing again osteoporosis with T-scores between -2.4 and -2.8.   PLAN:  I am delighted that Makela is doing so well from a breast cancer point of view. I do think that me is primarily suffering from some depression. She could be having thyroid issues as well, and this will be checked through her primary care physician, tells.  I encouraged her to increase her carbohydrates (she eats an excellent non-gluten diet) and to do some weight exercises, perhaps with one or 2 pound weights, in addition to her cardiac. I think she would benefit from gabapentin at bedtime, and I'm writing her a prescription for 100 mg. If she finds that does not work for her she can go to 200 mg and if that is not sufficient and she can try 300 mg. If she likes to 300 mg dose then I will rewrite the prescriptions so she does not have to take 3 tablets but only one. If she has any symptoms at all she can simply stop.  She will see as again in October. We are going to check CBC, differential, see med, and CA 2729 before that visit. She knows to call for any other problems that may develop before then.  Chauncey Cruel, MD      12/03/2014

## 2014-12-03 NOTE — Telephone Encounter (Signed)
Prescription obtained and escribed.  Message left on identified VM for pt.

## 2014-12-03 NOTE — Telephone Encounter (Signed)
Appointments made and avs printed for patient °

## 2014-12-03 NOTE — Telephone Encounter (Signed)
TC from patient this am. She states she saw Dr. [name] this morning and they discussed Gabapentin. She was not given a prescription and is wondering if this prescription was called in to her pharmacy.

## 2015-03-12 ENCOUNTER — Telehealth: Payer: Self-pay | Admitting: Nurse Practitioner

## 2015-03-12 NOTE — Telephone Encounter (Signed)
Returned patients call and left a message with a new appointment per her request

## 2015-03-13 ENCOUNTER — Other Ambulatory Visit (HOSPITAL_BASED_OUTPATIENT_CLINIC_OR_DEPARTMENT_OTHER): Payer: 59

## 2015-03-13 ENCOUNTER — Other Ambulatory Visit: Payer: 59

## 2015-03-13 DIAGNOSIS — C7951 Secondary malignant neoplasm of bone: Secondary | ICD-10-CM

## 2015-03-13 DIAGNOSIS — C50312 Malignant neoplasm of lower-inner quadrant of left female breast: Secondary | ICD-10-CM | POA: Diagnosis not present

## 2015-03-13 DIAGNOSIS — C50919 Malignant neoplasm of unspecified site of unspecified female breast: Secondary | ICD-10-CM

## 2015-03-13 DIAGNOSIS — M810 Age-related osteoporosis without current pathological fracture: Secondary | ICD-10-CM

## 2015-03-13 LAB — CBC WITH DIFFERENTIAL/PLATELET
BASO%: 0.1 % (ref 0.0–2.0)
BASOS ABS: 0 10*3/uL (ref 0.0–0.1)
EOS ABS: 0.1 10*3/uL (ref 0.0–0.5)
EOS%: 1.6 % (ref 0.0–7.0)
HCT: 37.6 % (ref 34.8–46.6)
HGB: 13.1 g/dL (ref 11.6–15.9)
LYMPH%: 20 % (ref 14.0–49.7)
MCH: 31.3 pg (ref 25.1–34.0)
MCHC: 34.8 g/dL (ref 31.5–36.0)
MCV: 90 fL (ref 79.5–101.0)
MONO#: 0.5 10*3/uL (ref 0.1–0.9)
MONO%: 7.5 % (ref 0.0–14.0)
NEUT#: 5 10*3/uL (ref 1.5–6.5)
NEUT%: 70.8 % (ref 38.4–76.8)
Platelets: 263 10*3/uL (ref 145–400)
RBC: 4.18 10*6/uL (ref 3.70–5.45)
RDW: 12.1 % (ref 11.2–14.5)
WBC: 7 10*3/uL (ref 3.9–10.3)
lymph#: 1.4 10*3/uL (ref 0.9–3.3)

## 2015-03-13 LAB — COMPREHENSIVE METABOLIC PANEL (CC13)
ALK PHOS: 96 U/L (ref 40–150)
ALT: 21 U/L (ref 0–55)
AST: 20 U/L (ref 5–34)
Albumin: 3.8 g/dL (ref 3.5–5.0)
Anion Gap: 7 mEq/L (ref 3–11)
BUN: 19.5 mg/dL (ref 7.0–26.0)
CALCIUM: 9.5 mg/dL (ref 8.4–10.4)
CO2: 28 mEq/L (ref 22–29)
Chloride: 98 mEq/L (ref 98–109)
Creatinine: 0.9 mg/dL (ref 0.6–1.1)
EGFR: 69 mL/min/{1.73_m2} — AB (ref >90)
Glucose: 90 mg/dl (ref 70–140)
POTASSIUM: 4.3 meq/L (ref 3.5–5.1)
Sodium: 133 mEq/L — ABNORMAL LOW (ref 136–145)
TOTAL PROTEIN: 6.3 g/dL — AB (ref 6.4–8.3)
Total Bilirubin: 0.52 mg/dL (ref 0.20–1.20)

## 2015-03-14 LAB — CANCER ANTIGEN 27.29: CA 27.29: 49 U/mL — ABNORMAL HIGH (ref 0–39)

## 2015-03-16 ENCOUNTER — Other Ambulatory Visit: Payer: Self-pay | Admitting: Oncology

## 2015-03-17 ENCOUNTER — Other Ambulatory Visit: Payer: Self-pay | Admitting: Nurse Practitioner

## 2015-03-23 ENCOUNTER — Ambulatory Visit: Payer: 59 | Admitting: Nurse Practitioner

## 2015-03-23 ENCOUNTER — Ambulatory Visit (HOSPITAL_BASED_OUTPATIENT_CLINIC_OR_DEPARTMENT_OTHER): Payer: 59 | Admitting: Oncology

## 2015-03-23 ENCOUNTER — Telehealth: Payer: Self-pay | Admitting: Oncology

## 2015-03-23 VITALS — BP 122/69 | HR 71 | Temp 98.2°F | Resp 18 | Ht 61.0 in | Wt 111.7 lb

## 2015-03-23 DIAGNOSIS — R970 Elevated carcinoembryonic antigen [CEA]: Secondary | ICD-10-CM

## 2015-03-23 DIAGNOSIS — C50812 Malignant neoplasm of overlapping sites of left female breast: Secondary | ICD-10-CM | POA: Diagnosis not present

## 2015-03-23 DIAGNOSIS — C7951 Secondary malignant neoplasm of bone: Secondary | ICD-10-CM | POA: Diagnosis not present

## 2015-03-23 DIAGNOSIS — C50312 Malignant neoplasm of lower-inner quadrant of left female breast: Secondary | ICD-10-CM

## 2015-03-23 DIAGNOSIS — M810 Age-related osteoporosis without current pathological fracture: Secondary | ICD-10-CM | POA: Diagnosis not present

## 2015-03-23 DIAGNOSIS — C50919 Malignant neoplasm of unspecified site of unspecified female breast: Secondary | ICD-10-CM

## 2015-03-23 DIAGNOSIS — F418 Other specified anxiety disorders: Secondary | ICD-10-CM

## 2015-03-23 DIAGNOSIS — H9313 Tinnitus, bilateral: Secondary | ICD-10-CM

## 2015-03-23 NOTE — Telephone Encounter (Signed)
Appointments made and avs printed for pateint °

## 2015-03-23 NOTE — Progress Notes (Signed)
ID: Sonia Hill   DOB: 13-Nov-1955  MR#: [id number]  FKC#:[id number]  PCP: Mylinda Latina, MD GYN: Molli Posey SU: Donnie Mesa OTHER MD: Minette Brine, Carvel Getting [phone])  CHIEF COMPLAINT: Metastatic breast cancer, estrogen receptor positive  CURRENT TREATMENT: Observation  BREAST CANCER HISTORY:  From the initial intake note:  Sonia Hill had screening mammography on 11/10/2009 showing very dense tissue with a possible distortion in the left breast.  Digital mammography on June 10th showed a 1.5 cm cluster of microcalcifications in the lower inner portion of the breast.  On physical exam, there was no palpable mass and no palpable axillary adenopathy.  There was some nipple inversion inferolaterally.  Ultrasound showed a 2.6 cm irregular mass-like area with dense posterior acoustic shadowing in the 3 o'clock position, 1 cm from the nipple, but normal appearing left axillary lymph nodes.    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  A second mass noted on ultrasound was also lobular, also low grade.  Both were ER and PR positive at well over 90%.  Both had low proliferation markers at 9%, and both were Her-2 negative by CISH with ratios of 1.6 and 1.24.  In short, both masses were nearly identical.    On June 16th, the patient had bilateral breast MRIs which showed in the left breast at 3 o'clock an irregular area of abnormal enhancement measuring up to 2.7 cm and in the left lower inner quadrant, a second post-biopsy area measuring 1.5 cm.  The difference between these two areas was stated at the conference the morning of the visit to be approximately 5 cm.  Given this data the patient opted for Left mastectomy with sentinel lymph node sampling, performed July 2011 with results as detailed below.  Subsequent history is as detailed  below.  INTERVAL HISTORY: Sonia Hill returns for followup of her metastatic breast cancer.interval history is generally stable and she is doing "fine". She continues to receive integrative therapy and she tells me her thyroid and adrenals are being adjusted. She is taking Zoloft and wonders if that is helping enough. She does feel anxious and depressed. She has less energy. She feels stressed at work and it is difficult for her to concentrate.  Other issues include concerns about her weight, which is however stable; insomnia; ringing in her ears, which tends to be seasonal, constipation problems, though not hard or bloody stools, and hot flashes.she is not exercising regularly  REVIEW OF SYSTEMS: A detailed review of systems today was otherwise noncontributory  PAST MEDICAL HISTORY: Past Medical History  Diagnosis Date  . SVD (spontaneous vaginal delivery)     x 1  . PONV (postoperative nausea and vomiting)   . Hypothyroidism   . Hyperlipidemia     diet controlled  . Depression     Hx - no current problem  . Chronic kidney disease     Only has right kidney  . GERD (gastroesophageal reflux disease)     diet controlled - no meds  . Arthritis     neck, upper back, shoulders - no meds - yoga  . lt breast ca dx'd 11/2009  . Osteoporosis, unspecified 12/06/2013  1. Osteopenia. 2. Congenital left renal atrophy.  3. History of recurrent UTIs. 4. History of bladder reconstruction for reflux more than 18 years ago. 5. History of depression and anxiety. 6. History of  hypothyroidism.  7. History of "sling procedure".  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    FAMILY HISTORY The patient's father is alive in his early 47s.  He has Alzheimer's disease. The patient's mother was diagnosed with breast cancer at the age of 73.  She died at the age of 38.  The patient's father had one out of two sisters with breast cancer.  There are  other breast cancers on the mother's side.  The patient has one brother and one younger sister, neither with cancer.    GYNECOLOGIC HISTORY:  (Reviewed 12/06/2013) She is GX P1.  That pregnancy was at age 69.  Menarche was at age 70.  She was perimenopausal at the time of diagnosis but is now convincingly postmenopausal  SOCIAL HISTORY:  (Reviewed 12/06/2013) She works as a Electrical engineer, part-time and on consultation.  Her husband, [name], is a Architect at [employer].  [name] works as a Pharmacist, hospital in [place].  The patient's son [name] is  in college.  The patient attends [employer].   ADVANCED DIRECTIVES: in place  HEALTH MAINTENANCE: (Updated 12/06/2013)  Social History  Substance Use Topics  . Smoking status: Never Smoker   . Smokeless tobacco: Never Used  . Alcohol Use: No     Colonoscopy: Not on file  PAP: UTD/May 2015, Dr. Matthew Saras  Bone density: [name] 2014, osteoporosis  Lipid panel: UTD (Not on file)    Allergies  Allergen Reactions  . Adhesive [Tape] Rash    Ok to use paper tape and tegaderm over IV site  . Betadine [Povidone Iodine] Rash  . Mercury Rash  . Sulfa Antibiotics Rash    Current Outpatient Prescriptions  Medication Sig Dispense Refill  . Acetylcysteine (NAC) 600 MG CAPS Take 1 capsule by mouth daily.    Marland Kitchen ascorbic acid (VITAMIN C) 1000 MG tablet Take 1,000 mg by mouth 3 (three) times daily.     Marland Kitchen BIOTIN FORTE PO Take 1 tablet by mouth daily.    . Bromelains 500 MG TABS Take 1 tablet by mouth daily.    . Calcium-Vitamin D-Vitamin K (CALCIUM SOFT CHEWS PO) Take 1 each by mouth 2 (two) times daily. Pt takes 2 capsules twice a day.    . Cholecalciferol (VITAMIN D-3) 5000 UNITS TABS Take 1 tablet by mouth daily.    . Coenzyme Q10 (CO Q-10) 200 MG CAPS Take 1 capsule by mouth daily.    Marland Kitchen FOLIC ACID PO Take 1 tablet by mouth daily.    Marland Kitchen gabapentin (NEURONTIN) 100 MG capsule Take one table at bedtime may increase to two tablets if  needed 60 capsule 3  . Green Tea, Camillia sinensis, (GREEN TEA EXTRACT PO) Take 1 tablet by mouth daily.    Marland Kitchen MAGNESIUM PO Take 1 tablet by mouth daily as needed (leg cramps).    . MULTIPLE VITAMIN PO Take 1 tablet by mouth daily. Pt takes 2 capsules at each meal. Total of 6 per day.    . niacin 250 MG tablet Take 250 mg by mouth at bedtime.    . OMEGA 3 1200 MG CAPS Take 1 capsule by mouth 2 (two) times daily.    Marland Kitchen OVER THE COUNTER MEDICATION Take 1 tablet by mouth daily. Iodoral Iodine 12.52m supplement    . Probiotic Product (PROBIOTIC DAILY PO) Take 1 tablet by mouth daily.     . Strontium Chloride CRYS Take 300 mg  by mouth daily.    Marland Kitchen thyroid (ARMOUR) 30 MG tablet Take 30 mg by mouth daily before lunch. Pt takes an am dose and lunch dose    . thyroid (ARMOUR) 60 MG tablet Take 60 mg by mouth daily before breakfast.     No current facility-administered medications for this visit.    OBJECTIVE: Middle aged white woman who appears stated age 49 Vitals:   03/23/15 1145  BP: 122/69  Pulse: 71  Temp: 98.2 F (36.8 C)  Resp: 18     Body mass index is 21.12 kg/(m^2).    ECOG FS: 0 Filed Weights   03/23/15 1145  Weight: 111 lb 11.2 oz (50.667 kg)   Sclerae unicteric, EOMs intact Oropharynx clear, dentition in good repair No cervical or supraclavicular adenopathy Lungs no rales or rhonchi Heart regular rate and rhythm Abd soft, nontender, positive bowel sounds MSK no focal spinal tenderness, no upper extremity lymphedema Neuro: nonfocal, well oriented, appropriate affect Breasts: right breast is unremarkable. The left breast is status post mastectomy. There is no evidence of chest wall recurrence. The left axilla is benign.     LAB RESULTS: patient obtains lab work through her primary care physician Results for CAYLYNN, MINCHEW (MRN [id number]) as of 03/23/2015 11:56  Ref. Range 11/29/2013 14:19 03/13/2014 12:30 06/09/2014 11:52 09/15/2014 13:26 03/13/2015 15:06  CA 27.29 Latest  Ref Range: 0-39 U/mL 36 35 36 35 49 (H)    Lab Results  Component Value Date   WBC 7.0 03/13/2015   NEUTROABS 5.0 03/13/2015   HGB 13.1 03/13/2015   HCT 37.6 03/13/2015   MCV 90.0 03/13/2015   PLT 263 03/13/2015      Chemistry      Component Value Date/Time   NA 133* 03/13/2015 1506   NA 140 03/23/2012 1557   K 4.3 03/13/2015 1506   K 3.7 03/23/2012 1557   CL 98 12/03/2012 1419   CL 102 03/23/2012 1557   CO2 28 03/13/2015 1506   CO2 28 03/23/2012 1557   BUN 19.5 03/13/2015 1506   BUN 12 03/23/2012 1557   CREATININE 0.9 03/13/2015 1506   CREATININE 1.00 11/24/2014 1736      Component Value Date/Time   CALCIUM 9.5 03/13/2015 1506   CALCIUM 10.2 06/09/2014 1152   CALCIUM 10.1 08/01/2011 1415   ALKPHOS 96 03/13/2015 1506   ALKPHOS 93 03/23/2012 1557   AST 20 03/13/2015 1506   AST 33 03/23/2012 1557   ALT 21 03/13/2015 1506   ALT 33 03/23/2012 1557   BILITOT 0.52 03/13/2015 1506   BILITOT 0.5 03/23/2012 1557      Lab Results  Component Value Date   LABCA2 49* 03/13/2015    STUDIES: Refuses mammography  ASSESSMENT: 59 y.o.  BRCA 1-2 negative [facility] woman  (1) status post left mastectomy and sentinel lymph node sampling in July 2011 for a T1 N1 M1, stage IV invasive lobular breast cancer, grade 1, strongly estrogen and progesterone receptor-positive, HER2 negative with MIB-1 of 9% and no HER2 amplification,   (2) with multiple sclerotic bone lesions at presentation seen only on CT scan (not on bone scan or PET scan), but  with biopsy-proven metastatic disease to bone; and an elevated CA 27.29 at presentation,   (3) Oncotype recurrence score of 4, predicts a good response to antiestrogens.  (4) Systemic treatment has consisted of  a) tamoxifen with evidence of response but poor tolerance  b) letrozole starting August 2012, discontinued October 2014 per patient   (  5) single functioning kidney  (6) status post bilateral salpingo-oophorectomy 01/24/2013, with  benign pathology  (7) osteoporosis; the patient refuses bisphosphonate therapy; started [facility] February 2014.    (a) bone density was obtained under the care of Dr. Matthew Saras at [facility], 06/25/2012, showing osteoporosis with a T score -2.6. This was repeated 12/20/2013, showing again osteoporosis with T-scores between -2.4 and -2.8.   PLAN:   certainly part of what Kawanna is going through his due to stress. Perhaps if she increase the Zoloft 200 mg daily she might be able to get more done. It would help if she exercises more regularly as well.   the stress in the job however is not going to go away. If she could take a little time off with her husband, perhaps up to a week, that would be great. However she says that's not going to be possible until next year.  as far as the ringing in the ears and problems with her eyes I suggested she take loratadine for a few days and see that improves his symptoms. In any case she needs to have her eyes checkedper the usual routine.   we discussed her lab work in detail. I suspect her slightly low sodium and protein may be related to some of the supplements she is taking. She is going to discuss that with Dr. Julien Nordmann who has much more experience in this and I do. The one lab of concern is the rise in the 27-29. This could indicate disease progression. Would we are going to do is check it again in a few weeks. If it is continuing to rise I will restage her at the end of December. She will in any case he me again in January  Pollyanna has a good understanding of this plan. She agrees with it. She knows to call for any problems that may develop before the next visit here.  Chauncey Cruel, MD     03/23/2015

## 2015-04-23 ENCOUNTER — Telehealth: Payer: Self-pay | Admitting: Oncology

## 2015-04-23 NOTE — Telephone Encounter (Signed)
Patient has called in and request to move 11/11 lab to 11/18,done

## 2015-04-24 ENCOUNTER — Other Ambulatory Visit: Payer: 59

## 2015-05-01 ENCOUNTER — Other Ambulatory Visit (HOSPITAL_BASED_OUTPATIENT_CLINIC_OR_DEPARTMENT_OTHER): Payer: 59

## 2015-05-01 DIAGNOSIS — C7951 Secondary malignant neoplasm of bone: Secondary | ICD-10-CM | POA: Diagnosis not present

## 2015-05-01 DIAGNOSIS — C50312 Malignant neoplasm of lower-inner quadrant of left female breast: Secondary | ICD-10-CM | POA: Diagnosis not present

## 2015-05-01 DIAGNOSIS — C50812 Malignant neoplasm of overlapping sites of left female breast: Secondary | ICD-10-CM | POA: Diagnosis not present

## 2015-05-01 DIAGNOSIS — M810 Age-related osteoporosis without current pathological fracture: Secondary | ICD-10-CM

## 2015-05-01 DIAGNOSIS — C50919 Malignant neoplasm of unspecified site of unspecified female breast: Secondary | ICD-10-CM

## 2015-05-01 LAB — COMPREHENSIVE METABOLIC PANEL (CC13)
ALT: 24 U/L (ref 0–55)
ANION GAP: 6 meq/L (ref 3–11)
AST: 25 U/L (ref 5–34)
Albumin: 3.7 g/dL (ref 3.5–5.0)
Alkaline Phosphatase: 100 U/L (ref 40–150)
BUN: 12.5 mg/dL (ref 7.0–26.0)
CALCIUM: 9.7 mg/dL (ref 8.4–10.4)
CHLORIDE: 98 meq/L (ref 98–109)
CO2: 27 mEq/L (ref 22–29)
CREATININE: 0.8 mg/dL (ref 0.6–1.1)
EGFR: 84 mL/min/{1.73_m2} — ABNORMAL LOW (ref >90)
Glucose: 79 mg/dl (ref 70–140)
Potassium: 3.9 mEq/L (ref 3.5–5.1)
Sodium: 132 mEq/L — ABNORMAL LOW (ref 136–145)
Total Bilirubin: 0.65 mg/dL (ref 0.20–1.20)
Total Protein: 6.6 g/dL (ref 6.4–8.3)

## 2015-05-01 LAB — CBC WITH DIFFERENTIAL/PLATELET
BASO%: 0.5 % (ref 0.0–2.0)
BASOS ABS: 0 10*3/uL (ref 0.0–0.1)
EOS ABS: 0.1 10*3/uL (ref 0.0–0.5)
EOS%: 0.9 % (ref 0.0–7.0)
HEMATOCRIT: 40.3 % (ref 34.8–46.6)
HGB: 13.6 g/dL (ref 11.6–15.9)
LYMPH#: 1.3 10*3/uL (ref 0.9–3.3)
LYMPH%: 17.4 % (ref 14.0–49.7)
MCH: 30.3 pg (ref 25.1–34.0)
MCHC: 33.8 g/dL (ref 31.5–36.0)
MCV: 89.7 fL (ref 79.5–101.0)
MONO#: 0.4 10*3/uL (ref 0.1–0.9)
MONO%: 5.5 % (ref 0.0–14.0)
NEUT#: 5.6 10*3/uL (ref 1.5–6.5)
NEUT%: 75.7 % (ref 38.4–76.8)
PLATELETS: 267 10*3/uL (ref 145–400)
RBC: 4.49 10*6/uL (ref 3.70–5.45)
RDW: 12.7 % (ref 11.2–14.5)
WBC: 7.4 10*3/uL (ref 3.9–10.3)

## 2015-05-05 ENCOUNTER — Telehealth: Payer: Self-pay | Admitting: *Deleted

## 2015-05-05 NOTE — Telephone Encounter (Signed)
Received call from pt requesting results of lab work done last Friday.  She would like a call back on her cell # [phone].  Message routed to Dr [name] & POD RN

## 2015-05-06 ENCOUNTER — Other Ambulatory Visit: Payer: Self-pay | Admitting: *Deleted

## 2015-05-06 DIAGNOSIS — C7951 Secondary malignant neoplasm of bone: Principal | ICD-10-CM

## 2015-05-06 DIAGNOSIS — C50919 Malignant neoplasm of unspecified site of unspecified female breast: Secondary | ICD-10-CM

## 2015-05-08 ENCOUNTER — Ambulatory Visit: Payer: 59

## 2015-05-08 ENCOUNTER — Other Ambulatory Visit (HOSPITAL_BASED_OUTPATIENT_CLINIC_OR_DEPARTMENT_OTHER): Payer: 59

## 2015-05-08 ENCOUNTER — Other Ambulatory Visit: Payer: Self-pay | Admitting: *Deleted

## 2015-05-08 ENCOUNTER — Telehealth: Payer: Self-pay | Admitting: Oncology

## 2015-05-08 DIAGNOSIS — C50919 Malignant neoplasm of unspecified site of unspecified female breast: Secondary | ICD-10-CM

## 2015-05-08 DIAGNOSIS — C7951 Secondary malignant neoplasm of bone: Secondary | ICD-10-CM

## 2015-05-08 DIAGNOSIS — M810 Age-related osteoporosis without current pathological fracture: Secondary | ICD-10-CM

## 2015-05-08 LAB — CANCER ANTIGEN 27.29: CA 27.29: 115 U/mL — ABNORMAL HIGH (ref 0–39)

## 2015-05-08 NOTE — Telephone Encounter (Signed)
Called and left a message with lab for today

## 2015-05-11 ENCOUNTER — Other Ambulatory Visit: Payer: Self-pay | Admitting: Oncology

## 2015-05-11 DIAGNOSIS — C50019 Malignant neoplasm of nipple and areola, unspecified female breast: Secondary | ICD-10-CM

## 2015-05-11 NOTE — Progress Notes (Unsigned)
I called Sonia Hill to let her know the rise in her CA-27-29. She agrees to staging and I'm going to try to get her a PET scan if at all possible. She will see me sometime in January.

## 2015-05-13 ENCOUNTER — Telehealth: Payer: Self-pay | Admitting: Oncology

## 2015-05-13 NOTE — Telephone Encounter (Signed)
s.w.l pt and advised on DEC adn [name] appt.Marland KitchenMarland KitchenMarland KitchenMarland Kitchenpt ok and aware

## 2015-05-28 ENCOUNTER — Telehealth: Payer: Self-pay | Admitting: *Deleted

## 2015-05-28 NOTE — Telephone Encounter (Addendum)
Call from patient "requesting to reschedule PET on 06/19/2015.  My husband's mother died and we're flying to [place] on Monday.  Don't know if it's okay for me to call scheduling."  Called Nayana offered condolences and that it is okay to call [facility] to reschedule.  Call transferred to [phone].

## 2015-05-28 NOTE — Telephone Encounter (Signed)
Thank you :)

## 2015-05-29 ENCOUNTER — Telehealth: Payer: Self-pay

## 2015-05-29 ENCOUNTER — Other Ambulatory Visit (HOSPITAL_BASED_OUTPATIENT_CLINIC_OR_DEPARTMENT_OTHER): Payer: 59

## 2015-05-29 DIAGNOSIS — C7951 Secondary malignant neoplasm of bone: Secondary | ICD-10-CM

## 2015-05-29 DIAGNOSIS — M810 Age-related osteoporosis without current pathological fracture: Secondary | ICD-10-CM

## 2015-05-29 DIAGNOSIS — C50919 Malignant neoplasm of unspecified site of unspecified female breast: Secondary | ICD-10-CM | POA: Diagnosis not present

## 2015-05-29 LAB — COMPREHENSIVE METABOLIC PANEL
ALBUMIN: 4.1 g/dL (ref 3.5–5.0)
ALK PHOS: 106 U/L (ref 40–150)
ALT: 25 U/L (ref 0–55)
ANION GAP: 9 meq/L (ref 3–11)
AST: 25 U/L (ref 5–34)
BILIRUBIN TOTAL: 0.7 mg/dL (ref 0.20–1.20)
BUN: 15 mg/dL (ref 7.0–26.0)
CALCIUM: 10.5 mg/dL — AB (ref 8.4–10.4)
CO2: 26 mEq/L (ref 22–29)
CREATININE: 0.9 mg/dL (ref 0.6–1.1)
Chloride: 103 mEq/L (ref 98–109)
EGFR: 73 mL/min/{1.73_m2} — ABNORMAL LOW (ref >90)
Glucose: 85 mg/dl (ref 70–140)
Potassium: 4.5 mEq/L (ref 3.5–5.1)
Sodium: 139 mEq/L (ref 136–145)
TOTAL PROTEIN: 7.4 g/dL (ref 6.4–8.3)

## 2015-05-29 LAB — CBC WITH DIFFERENTIAL/PLATELET
BASO%: 0.5 % (ref 0.0–2.0)
Basophils Absolute: 0 10*3/uL (ref 0.0–0.1)
EOS ABS: 0.1 10*3/uL (ref 0.0–0.5)
EOS%: 1.6 % (ref 0.0–7.0)
HCT: 43 % (ref 34.8–46.6)
HEMOGLOBIN: 14.2 g/dL (ref 11.6–15.9)
LYMPH%: 17 % (ref 14.0–49.7)
MCH: 29.9 pg (ref 25.1–34.0)
MCHC: 33 g/dL (ref 31.5–36.0)
MCV: 90.6 fL (ref 79.5–101.0)
MONO#: 0.4 10*3/uL (ref 0.1–0.9)
MONO%: 6.1 % (ref 0.0–14.0)
NEUT%: 74.8 % (ref 38.4–76.8)
NEUTROS ABS: 5 10*3/uL (ref 1.5–6.5)
Platelets: 292 10*3/uL (ref 145–400)
RBC: 4.75 10*6/uL (ref 3.70–5.45)
RDW: 12.8 % (ref 11.2–14.5)
WBC: 6.6 10*3/uL (ref 3.9–10.3)
lymph#: 1.1 10*3/uL (ref 0.9–3.3)

## 2015-05-29 NOTE — Telephone Encounter (Signed)
Pt called requesting an rx be sent to [facility] for mastectomy bras and prosthetics.

## 2015-05-30 LAB — CANCER ANTIGEN 27.29: CA 27.29: 149 U/mL — AB (ref 0–39)

## 2015-06-01 ENCOUNTER — Encounter (HOSPITAL_COMMUNITY): Payer: 59

## 2015-06-02 NOTE — Telephone Encounter (Signed)
Writer called patient back and LVM encouraging her to visit [facility] to obtain what she needs.  Writer then explained that the shop will then fax a prescription for her needs and Dr. [name] will sign the prescription then it gets faxed back to them

## 2015-06-05 ENCOUNTER — Ambulatory Visit (HOSPITAL_COMMUNITY)
Admission: RE | Admit: 2015-06-05 | Discharge: 2015-06-05 | Disposition: A | Discharge status: Home / Self Care | Payer: 59 | Source: Ambulatory Visit | Attending: Oncology | Admitting: Oncology

## 2015-06-05 DIAGNOSIS — Z9012 Acquired absence of left breast and nipple: Secondary | ICD-10-CM | POA: Insufficient documentation

## 2015-06-05 DIAGNOSIS — R978 Other abnormal tumor markers: Secondary | ICD-10-CM | POA: Insufficient documentation

## 2015-06-05 DIAGNOSIS — C50019 Malignant neoplasm of nipple and areola, unspecified female breast: Secondary | ICD-10-CM

## 2015-06-05 DIAGNOSIS — C50919 Malignant neoplasm of unspecified site of unspecified female breast: Secondary | ICD-10-CM | POA: Insufficient documentation

## 2015-06-05 LAB — GLUCOSE, CAPILLARY: Glucose-Capillary: 77 mg/dL (ref 65–99)

## 2015-06-05 MED ORDER — FLUDEOXYGLUCOSE F - 18 (FDG) INJECTION
5.5000 | Freq: Once | INTRAVENOUS | Status: AC | PRN
  Administered 2015-06-05: 5.5 via INTRAVENOUS

## 2015-06-09 ENCOUNTER — Other Ambulatory Visit: Payer: Self-pay | Admitting: Nurse Practitioner

## 2015-06-09 ENCOUNTER — Telehealth: Payer: Self-pay | Admitting: Nurse Practitioner

## 2015-06-09 ENCOUNTER — Telehealth: Payer: Self-pay | Admitting: *Deleted

## 2015-06-09 NOTE — Telephone Encounter (Signed)
Spoke with patient to review results of PET scan. No evidence of residual or metastatic disease. Mildly hypermetabolic rectal wall thickening and associated hypermetabolic presacral soft tissue noted. Colonoscopy with biopsy suggested. Results faxed to Dr. Oletta Lamas of [facility]. Patient will call herself to make follow up appointment.

## 2015-06-10 ENCOUNTER — Other Ambulatory Visit: Payer: Self-pay | Admitting: Nurse Practitioner

## 2015-06-10 DIAGNOSIS — R948 Abnormal results of function studies of other organs and systems: Secondary | ICD-10-CM

## 2015-06-11 ENCOUNTER — Telehealth: Payer: Self-pay | Admitting: Nurse Practitioner

## 2015-06-11 NOTE — Telephone Encounter (Signed)
Called patient and she saw dr Oletta Lamas yesterday and will see dr Marcello Moores at [facility] 06/22/15 9:10

## 2015-06-15 ENCOUNTER — Other Ambulatory Visit: Payer: Self-pay | Admitting: Oncology

## 2015-06-16 ENCOUNTER — Other Ambulatory Visit: Payer: 59

## 2015-06-18 ENCOUNTER — Other Ambulatory Visit: Payer: Self-pay | Admitting: Gastroenterology

## 2015-06-22 ENCOUNTER — Ambulatory Visit (HOSPITAL_BASED_OUTPATIENT_CLINIC_OR_DEPARTMENT_OTHER): Payer: 59 | Admitting: Oncology

## 2015-06-22 VITALS — BP 115/61 | HR 72 | Temp 97.5°F | Resp 18 | Ht 61.0 in | Wt 112.1 lb

## 2015-06-22 DIAGNOSIS — C50312 Malignant neoplasm of lower-inner quadrant of left female breast: Secondary | ICD-10-CM

## 2015-06-22 DIAGNOSIS — M810 Age-related osteoporosis without current pathological fracture: Secondary | ICD-10-CM

## 2015-06-22 DIAGNOSIS — M545 Low back pain: Secondary | ICD-10-CM | POA: Diagnosis not present

## 2015-06-22 DIAGNOSIS — C50912 Malignant neoplasm of unspecified site of left female breast: Secondary | ICD-10-CM

## 2015-06-22 DIAGNOSIS — R970 Elevated carcinoembryonic antigen [CEA]: Secondary | ICD-10-CM

## 2015-06-22 DIAGNOSIS — C7951 Secondary malignant neoplasm of bone: Secondary | ICD-10-CM

## 2015-06-22 DIAGNOSIS — F329 Major depressive disorder, single episode, unspecified: Secondary | ICD-10-CM

## 2015-06-22 NOTE — Progress Notes (Signed)
ID: DASHAWN GOLDA   DOB: [name] 04, 1956  MR#: [id number]  MKL#:[id number]  PCP: Mylinda Latina, MD GYN: Molli Posey SU: Donnie Mesa OTHER MD: Minette Brine, Carvel Getting [phone]), Laurence Spates, Leighton Ruff  CHIEF COMPLAINT: Metastatic breast cancer, estrogen receptor positive  CURRENT TREATMENT: Observation  BREAST CANCER HISTORY:  From the initial intake note:  Skarlette had screening mammography on 11/10/2009 showing very dense tissue with a possible distortion in the left breast.  Digital mammography on June 10th showed a 1.5 cm cluster of microcalcifications in the lower inner portion of the breast.  On physical exam, there was no palpable mass and no palpable axillary adenopathy.  There was some nipple inversion inferolaterally.  Ultrasound showed a 2.6 cm irregular mass-like area with dense posterior acoustic shadowing in the 3 o'clock position, 1 cm from the nipple, but normal appearing left axillary lymph nodes.    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  A second mass noted on ultrasound was also lobular, also low grade.  Both were ER and PR positive at well over 90%.  Both had low proliferation markers at 9%, and both were Her-2 negative by CISH with ratios of 1.6 and 1.24.  In short, both masses were nearly identical.    On June 16th, the patient had bilateral breast MRIs which showed in the left breast at 3 o'clock an irregular area of abnormal enhancement measuring up to 2.7 cm and in the left lower inner quadrant, a second post-biopsy area measuring 1.5 cm.  The difference between these two areas was stated at the conference the morning of the visit to be approximately 5 cm.  Given this data the patient opted for Left mastectomy with sentinel lymph node sampling, performed July 2011 with results as detailed  below.  Subsequent history is as detailed below.  INTERVAL HISTORY: Kilah returns for followup of her metastatic breast cancer accompanied by her husband [name]. At the last visit we noted her CA-27-29 was trending up, and this was repeated mid December and was even higher. Accordingly a PET scan was obtained 06/05/2015. This showed no findings to suggest recurrent or metastatic breast cancer. However there was a mildly hypermetabolic rectal wall thickening associated with presacral soft tissue. She saw Dr. Oletta Lamas and underwent sigmoidoscopy with rectal biopsy on 06/18/2015. The pathology from this procedure ([id number]) showed benign colorectal mucosa with reactive changes.  REVIEW OF SYSTEMS: She still has problems with her bowels which have never gone back to normal. Dr. Oletta Lamas started her on MiraLAX daily and that has made her bowels softer. She also has added fiber. However thinks for not "like they were before". She tends to constipation. In addition last week she had a day and a half of nausea vomiting and diarrhea which she assumes was viral. That has completely resolved. There has been no melena, stools or blood in her stool that she is aware of. She has some back pain but this islong-standing and no different from before. She appears clearly depressed and tells me she goes to bed early, perhaps at 8 or 8:30 in the evening, and is staying in bed all the way to 11 AM. She tells me she only took the Zoloft for 2 weeks because it was making her feel a little "different". A detailed review of systems today was otherwise noncontributory  PAST MEDICAL HISTORY: Past  Medical History  Diagnosis Date  . SVD (spontaneous vaginal delivery)     x 1  . PONV (postoperative nausea and vomiting)   . Hypothyroidism   . Hyperlipidemia     diet controlled  . Depression     Hx - no current problem  . Chronic kidney disease     Only has right kidney  . GERD (gastroesophageal reflux disease)     diet  controlled - no meds  . Arthritis     neck, upper back, shoulders - no meds - yoga  . lt breast ca dx'd 11/2009  . Osteoporosis, unspecified 12/06/2013  1. Osteopenia. 2. Congenital left renal atrophy.  3. History of recurrent UTIs. 4. History of bladder reconstruction for reflux more than 18 years ago. 5. History of depression and anxiety. 6. History of hypothyroidism.  7. History of "sling procedure".  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    FAMILY HISTORY The patient's father is alive in his early 67s.  He has Alzheimer's disease. The patient's mother was diagnosed with breast cancer at the age of 64.  She died at the age of 75.  The patient's father had one out of two sisters with breast cancer.  There are other breast cancers on the mother's side.  The patient has one brother and one younger sister, neither with cancer.    GYNECOLOGIC HISTORY:  (Reviewed 12/06/2013) She is GX P1.  That pregnancy was at age 50.  Menarche was at age 70.  She was perimenopausal at the time of diagnosis but is now convincingly postmenopausal  SOCIAL HISTORY:  (Reviewed 12/06/2013) She works as a Electrical engineer, part-time and on consultation.  Her husband, [name], is a Architect at [employer].  [name] works as a Pharmacist, hospital in [place].  The patient's son [name] is  in college.  The patient attends [employer].   ADVANCED DIRECTIVES: in place  HEALTH MAINTENANCE: (Updated 12/06/2013)  Social History  Substance Use Topics  . Smoking status: Never Smoker   . Smokeless tobacco: Never Used  . Alcohol Use: No     Colonoscopy: 06/18/2015; [facility]  PAP: UTD/May 2015, Dr. Matthew Saras  Bone density: [name] 2014, osteoporosis  Lipid panel: UTD (Not on file)    Allergies  Allergen Reactions  . Adhesive [Tape] Rash    Ok to use paper tape and tegaderm over IV site  . Betadine [Povidone Iodine] Rash  . Mercury  Rash  . Sulfa Antibiotics Rash    Current Outpatient Prescriptions  Medication Sig Dispense Refill  . Acetylcysteine (NAC) 600 MG CAPS Take 1 capsule by mouth daily.    Marland Kitchen ascorbic acid (VITAMIN C) 1000 MG tablet Take 1,000 mg by mouth 3 (three) times daily.     . Ashwagandha 125 MG CAPS Take 1 capsule by mouth 1 day or 1 dose.    Marland Kitchen BIOTIN FORTE PO Take 1 tablet by mouth daily.    . Bromelains 500 MG TABS Take 1 tablet by mouth daily.    . Calcium-Vitamin D-Vitamin K (CALCIUM SOFT CHEWS PO) Take 1 each by mouth 2 (two) times daily. Pt takes 2 capsules twice a day.    . Cholecalciferol (VITAMIN D-3) 5000 UNITS TABS Take 1 tablet by mouth daily.    . Coenzyme Q10 (CO Q-10) 200 MG CAPS Take 1 capsule by mouth daily.    Marland Kitchen FOLIC ACID PO Take 1 tablet by mouth  daily.    . gabapentin (NEURONTIN) 100 MG capsule Take one table at bedtime may increase to two tablets if needed 60 capsule 3  . Green Tea, Camillia sinensis, (GREEN TEA EXTRACT PO) Take 1 tablet by mouth daily.    Marland Kitchen MAGNESIUM PO Take 1 tablet by mouth daily as needed (leg cramps).    . MULTIPLE VITAMIN PO Take 1 tablet by mouth daily. Pt takes 2 capsules at each meal. Total of 6 per day.    . niacin 250 MG tablet Take 250 mg by mouth at bedtime.    . OMEGA 3 1200 MG CAPS Take 1 capsule by mouth 2 (two) times daily.    Marland Kitchen OVER THE COUNTER MEDICATION Take 1 tablet by mouth daily. Iodoral Iodine 12.25m supplement    . Probiotic Product (PROBIOTIC DAILY PO) Take 1 tablet by mouth daily.     . sertraline (ZOLOFT) 50 MG tablet Take 1 tablet (50 mg total) by mouth daily.    . Strontium Chloride CRYS Take 300 mg by mouth daily.    .Marland Kitchenthyroid (ARMOUR) 30 MG tablet Take 30 mg by mouth daily before lunch. Pt takes an am dose and lunch dose    . thyroid (ARMOUR) 60 MG tablet Take 60 mg by mouth daily before breakfast.     No current facility-administered medications for this visit.    OBJECTIVE: Middle aged white woman in no acute distress Filed  Vitals:   06/22/15 1559  BP: 115/61  Pulse: 72  Temp: 97.5 F (36.4 C)  Resp: 18     Body mass index is 21.19 kg/(m^2).    ECOG FS: 0 Filed Weights   06/22/15 1559  Weight: 112 lb 1.6 oz (50.848 kg)   Sclerae unicteric, pupils round and equal Oropharynx clear and moist-- no thrush or other lesions No cervical or supraclavicular adenopathy Lungs no rales or rhonchi Heart regular rate and rhythm Abd soft, nontender, positive bowel sounds, no masses palpated MSK no focal spinal tenderness, no upper extremity lymphedema Neuro: nonfocal, well oriented, appropriate affect Breasts: the right breast shows no masses or suspicious changes. The right axilla is benign. The left breast is status post mastectomy. There is no evidence of chest wall recurrence. The left axilla is benign.   LAB RESULTS: patient obtains lab work through her primary care physician Results for NMELLISA, ARSHAD(MRN [id number] as of 06/22/2015 16:03  Ref. Range 06/09/2014 11:52 09/15/2014 13:26 03/13/2015 15:06 05/08/2015 10:56 05/29/2015 09:22  CA 27.29 Latest Ref Range: 0-39 U/mL 36 35 49 (H) 115 (H) 149 (H)    Lab Results  Component Value Date   WBC 6.6 05/29/2015   NEUTROABS 5.0 05/29/2015   HGB 14.2 05/29/2015   HCT 43.0 05/29/2015   MCV 90.6 05/29/2015   PLT 292 05/29/2015      Chemistry      Component Value Date/Time   NA 139 05/29/2015 0922   NA 140 03/23/2012 1557   K 4.5 05/29/2015 0922   K 3.7 03/23/2012 1557   CL 98 12/03/2012 1419   CL 102 03/23/2012 1557   CO2 26 05/29/2015 0922   CO2 28 03/23/2012 1557   BUN 15.0 05/29/2015 0922   BUN 12 03/23/2012 1557   CREATININE 0.9 05/29/2015 0922   CREATININE 1.00 11/24/2014 1736      Component Value Date/Time   CALCIUM 10.5* 05/29/2015 0922   CALCIUM 10.2 06/09/2014 1152   CALCIUM 10.1 08/01/2011 1415   ALKPHOS 106 05/29/2015 09179  ALKPHOS 93 03/23/2012 1557   AST 25 05/29/2015 0922   AST 33 03/23/2012 1557   ALT 25 05/29/2015 0922   ALT 33  03/23/2012 1557   BILITOT 0.70 05/29/2015 0922   BILITOT 0.5 03/23/2012 1557      Lab Results  Component Value Date   LABCA2 149* 05/29/2015    STUDIES: Nm Pet Image Restag (ps) Skull Base To Thigh  06/05/2015  CLINICAL DATA:  Subsequent treatment strategy for metastatic breast cancer. Elevated tumor markers. EXAM: NUCLEAR MEDICINE PET SKULL BASE TO THIGH TECHNIQUE: 5.5 mCi F-18 FDG was injected intravenously. Full-ring PET imaging was performed from the skull base to thigh after the radiotracer. CT data was obtained and used for attenuation correction and anatomic localization. FASTING BLOOD GLUCOSE:  Value: 77 mg/dl COMPARISON:  CT chest dated 11/24/2014. MRI abdomen dated 11/29/2013. Whole-body bone scan dated 08/13/2013. FINDINGS: NECK No hypermetabolic lymph nodes in the neck. CHEST 3 mm calcified granuloma in the posterior lingula (series 6/ image 34). No suspicious pulmonary nodules. The heart is normal in size.  No pericardial effusion. No hypermetabolic thoracic lymphadenopathy. Status post left axillary lymph node dissection. ABDOMEN/PELVIS No abnormal hypermetabolic activity within the liver, pancreas, adrenal glands, or spleen. Wall thickening involving the rectum (series 4/image 160), with associated hypermetabolism, max SUV 4.6. Associated minimal right presacral soft tissue, measuring 1.3 x 2.7 cm (series 4/image 153), max SUV 5.3. No hypermetabolic lymph nodes in the abdomen or pelvis. SKELETON Status post left mastectomy. Focal hypermetabolism along the left L4 facet, max SUV 3.2, but with associated degenerative changes on CT (series 4/ image 127). No focal hypermetabolic activity to suggest skeletal metastasis. IMPRESSION: Status post left mastectomy with left axillary lymph node dissection. No findings to suggest recurrent or metastatic breast cancer. However, there is mildly hypermetabolic rectal wall thickening and associated hypermetabolic presacral soft tissue. The rectal wall  thickening itself and the relatively mild hypermetabolism would simply reflect proctitis, but especially given the associated presacral soft tissue, primary rectal neoplasm with presacral metastasis is not excluded. Colonoscopy with possible biopsy is suggested. Electronically Signed   By: Julian Hy M.D.   On: 06/05/2015 16:21     ASSESSMENT: 60 y.o.  BRCA 1-2 negative [facility] woman  (1) status post left mastectomy and sentinel lymph node sampling in July 2011 for a T1 N1 M1, stage IV invasive lobular breast cancer, grade 1, strongly estrogen and progesterone receptor-positive, HER2 negative with MIB-1 of 9% and no HER2 amplification,   (2) with multiple sclerotic bone lesions at presentation seen only on CT scan (not on bone scan or PET scan), but  with biopsy-proven metastatic disease to bone; and an elevated CA 27.29 at presentation,   (3) Oncotype recurrence score of 4, predicts a good response to antiestrogens.  (4) Systemic treatment has consisted of  a) tamoxifen with evidence of response but poor tolerance  b) letrozole starting August 2012, discontinued October 2014 per patient   (5) single functioning kidney  (6) status post bilateral salpingo-oophorectomy 01/24/2013, with benign pathology  (7) osteoporosis; the patient refuses bisphosphonate therapy; started [facility] February 2014.    (a) bone density was obtained under the care of Dr. Matthew Saras at [facility], 06/25/2012, showing osteoporosis with a T score -2.6. This was repeated 12/20/2013, showing again osteoporosis with T-scores between -2.4 and -2.8.  (8) the patient refuses standard  Mammography or tomography; undergoing thermography screening of the right breast.   PLAN:  I spent approximately 50 minutes  with Namiyah and [name] going over her situation. The PET scan results are very favorable, but one has to remember that her original bone metastases did not show on the PET or bone scan. The  only showed on the CT scan. The patient met with Dr.Thomas and she raised the question that the problem may be outside the colon, and that what was biopsied, which appears to be slightly inflamed scar tissue, may not be what was causing the increased uptake on the PET scan. Dr. Marcello Moores suggested possible biopsy.  We reviewed the PET scan in detail today. I think the idea of biopsying something if it can be located would make sense at this point but before doing that Anagabriela would like a little bit more data so I am trying to set her up for an MRI of the pelvis. Hopefully we can get this done within the next week. She will return to see me on January 20 and at that point we can decide whether to proceed to biopsy or not  We also discussed at length the meaning of a rising CA-27-29. This is not an entirely specific test, but the trend is suggestive of cancer progression. We are going to repeat this again on the day before she sees me, and if we see further growth and don't have a clear plan regarding the pelvis I would suggest first of all that we obtain a mammogram and tomogram of the right breast to make sure we don't have a new cancer to deal with. A second suggestion will be to start tamoxifen or another antiestrogen for 3 months and see if that brings down the tumor marker.  Quite aside from all this, Yaremi remains under a lot of stress. She is sleeping very long hours and I think she is actively depressed. I think it would be to her advantage to go back to Zoloft and stay on it for at least 6 weeks before she can assess whether it would work for her. She still has many unavailable at home, and she tells me she is going to be starting "at the lowest dose" and slowly increase it as tolerated.  [place] has a good understanding of the current situation and is in agreement with the plan just discussed. She knows to call for any problems that may develop before the next visit here.  Chauncey Cruel, MD      06/22/2015

## 2015-06-23 ENCOUNTER — Other Ambulatory Visit (HOSPITAL_COMMUNITY): Payer: Self-pay | Admitting: General Surgery

## 2015-06-23 ENCOUNTER — Telehealth: Payer: Self-pay | Admitting: Oncology

## 2015-06-23 DIAGNOSIS — K629 Disease of anus and rectum, unspecified: Secondary | ICD-10-CM

## 2015-06-23 NOTE — Telephone Encounter (Signed)
Called and left a message with appointments per pof  anne

## 2015-06-24 ENCOUNTER — Other Ambulatory Visit: Payer: Self-pay | Admitting: *Deleted

## 2015-06-24 ENCOUNTER — Encounter: Payer: Self-pay | Admitting: Genetic Counselor

## 2015-06-24 DIAGNOSIS — Z1379 Encounter for other screening for genetic and chromosomal anomalies: Secondary | ICD-10-CM | POA: Insufficient documentation

## 2015-06-26 NOTE — Telephone Encounter (Signed)
Lattie Haw called stating she was trying to get patient registered for MRI pelvis- ordered with contrast. States patient is insisting the MRI is supposed to be WITHOUT contrast.   Please change order if patient is correct or call patient to explain why it is "with contrast".  MRI is supposed to be done on Tuesday, 1/17

## 2015-06-29 ENCOUNTER — Other Ambulatory Visit: Payer: Self-pay | Admitting: Oncology

## 2015-06-29 ENCOUNTER — Telehealth (HOSPITAL_COMMUNITY): Payer: Self-pay | Admitting: Oncology

## 2015-06-29 NOTE — Telephone Encounter (Signed)
Writer spoke with MRI who states that the only way to perform an MRI with contrast is with gadolinium which has to be given IV.  They will check her creatinine level and dose reduce the potency if needed.  A CT scan is with oral contrast however Dr. [name] needs this test for diagnostic purposes and patient is aware of this.

## 2015-06-29 NOTE — Telephone Encounter (Signed)
We are looking for a soft tissue mass in the pelvis. The only way to tell this apart from a loop of gut is for there to be ORAL contrast in the gut. She needs the ORAL contrast-- I don't think she needs IV contrast-- can you clarify this w her? If she is not willing to take oral contrast there is no paint to the MRI  Let me know if I need to change order

## 2015-06-29 NOTE — Progress Notes (Unsigned)
This patient could be seen in genetics again. She had negative BRCA testing in 2011, but could use updated testing.   Santiago Glad

## 2015-06-30 ENCOUNTER — Ambulatory Visit (HOSPITAL_COMMUNITY): Admission: RE | Admit: 2015-06-30 | Payer: 59 | Source: Ambulatory Visit

## 2015-06-30 ENCOUNTER — Telehealth: Payer: Self-pay | Admitting: *Deleted

## 2015-06-30 ENCOUNTER — Other Ambulatory Visit: Payer: Self-pay | Admitting: Oncology

## 2015-06-30 NOTE — Telephone Encounter (Signed)
Pt received message that MRI has been rescheduled to 1/25. Pt states she is able to feel the mass when she wipes after going to [facility]. It is between rectum and vaginal wall.   Wants to make Dr [name] aware.

## 2015-07-01 ENCOUNTER — Telehealth: Payer: Self-pay | Admitting: *Deleted

## 2015-07-01 NOTE — Telephone Encounter (Signed)
Dr. [name] is aware and will send a POF to the schedulers to set up a new appt.  Writer LVM with patient so she is aware.

## 2015-07-01 NOTE — Telephone Encounter (Signed)
"  My MRI was cancelled so I thought my F/u with Dr. [name] would be cancelled too.  Please call me to verify appointment as I just received a reminder call for the appointment on 07-03-2015.

## 2015-07-02 ENCOUNTER — Other Ambulatory Visit: Payer: Self-pay | Admitting: Oncology

## 2015-07-02 ENCOUNTER — Telehealth: Payer: Self-pay | Admitting: Oncology

## 2015-07-02 NOTE — Telephone Encounter (Signed)
i did touch base with the patient about cancelling 1/20 appt per pof

## 2015-07-03 ENCOUNTER — Ambulatory Visit: Payer: 59 | Admitting: Oncology

## 2015-07-03 ENCOUNTER — Other Ambulatory Visit: Payer: 59

## 2015-07-03 ENCOUNTER — Ambulatory Visit (HOSPITAL_COMMUNITY): Admission: RE | Admit: 2015-07-03 | Payer: 59 | Source: Ambulatory Visit

## 2015-07-06 ENCOUNTER — Telehealth: Payer: Self-pay | Admitting: *Deleted

## 2015-07-06 NOTE — Telephone Encounter (Signed)
Pt called to this RN to state she has been diagnosed with a " stage 3 tapeworm infection ".  Sonia Hill was worked up by Dr [name] at [facility] in [place].  She has been started on antiparasitic medication which she takes for 1 week then off for 1 week and then resumes again.  Per discussion of above area of noted abnormality may be related to above vs cancer.  Sonia Hill would like to hold off on obtaining the MRI scheduled for this Wednesday until she completes parasitic therapy and is cleared from this diagnosis.  Sonia Hill will keep [place] informed of above.  She is currently scheduled for follow up with MD 2/6.

## 2015-07-08 ENCOUNTER — Ambulatory Visit (HOSPITAL_COMMUNITY): Payer: 59

## 2015-07-16 ENCOUNTER — Other Ambulatory Visit: Payer: Self-pay | Admitting: *Deleted

## 2015-07-16 ENCOUNTER — Telehealth: Payer: Self-pay | Admitting: Oncology

## 2015-07-16 NOTE — Progress Notes (Signed)
This RN spoke with pt per her call stating she would prefer to wait until completion of current therapy for parasites to have labs drawn and then follow up with MD.  Sonia Hill states she is currently 1/2 way thru therapy and is "feeling more energetic so I hope I keep feeling this way "   POF sent to scheduling.

## 2015-07-16 NOTE — Telephone Encounter (Signed)
cld & spoke to pt and gave pt time & date of appt °

## 2015-07-17 ENCOUNTER — Other Ambulatory Visit: Payer: 59

## 2015-07-20 ENCOUNTER — Ambulatory Visit: Payer: 59 | Admitting: Oncology

## 2015-07-24 ENCOUNTER — Telehealth: Payer: Self-pay

## 2015-07-24 ENCOUNTER — Other Ambulatory Visit: Payer: Self-pay | Admitting: *Deleted

## 2015-07-24 DIAGNOSIS — R978 Other abnormal tumor markers: Secondary | ICD-10-CM

## 2015-07-24 NOTE — Telephone Encounter (Signed)
Pt called clinic on her way to her PCP requesting labwork needed by oncologist so she can have them all done at the PCP.  Chart reviewed and advised pt MD usually has a CBC and CMET done.  Writer requested labs be faxed to the clinig - pt voiced understanding.

## 2015-08-04 ENCOUNTER — Other Ambulatory Visit: Payer: 59

## 2015-08-04 ENCOUNTER — Telehealth: Payer: Self-pay | Admitting: Oncology

## 2015-08-04 NOTE — Telephone Encounter (Signed)
Returned patient's call re rescheduling 2/21 lab to 2/22. Rescheduled appointment and left message for patient with new date/time for 2/22 @ 11:15 am. Also confirmed 2/28 f/u.

## 2015-08-05 ENCOUNTER — Other Ambulatory Visit (HOSPITAL_BASED_OUTPATIENT_CLINIC_OR_DEPARTMENT_OTHER): Payer: 59

## 2015-08-05 DIAGNOSIS — C7951 Secondary malignant neoplasm of bone: Secondary | ICD-10-CM

## 2015-08-05 DIAGNOSIS — C50919 Malignant neoplasm of unspecified site of unspecified female breast: Secondary | ICD-10-CM

## 2015-08-05 DIAGNOSIS — M810 Age-related osteoporosis without current pathological fracture: Secondary | ICD-10-CM

## 2015-08-05 LAB — CBC WITH DIFFERENTIAL/PLATELET
BASO%: 0.1 % (ref 0.0–2.0)
Basophils Absolute: 0 10*3/uL (ref 0.0–0.1)
EOS ABS: 0.1 10*3/uL (ref 0.0–0.5)
EOS%: 0.7 % (ref 0.0–7.0)
HEMATOCRIT: 40.4 % (ref 34.8–46.6)
HGB: 14.1 g/dL (ref 11.6–15.9)
LYMPH%: 15.7 % (ref 14.0–49.7)
MCH: 30.7 pg (ref 25.1–34.0)
MCHC: 34.9 g/dL (ref 31.5–36.0)
MCV: 88 fL (ref 79.5–101.0)
MONO#: 0.3 10*3/uL (ref 0.1–0.9)
MONO%: 4.6 % (ref 0.0–14.0)
NEUT#: 5.6 10*3/uL (ref 1.5–6.5)
NEUT%: 78.9 % — AB (ref 38.4–76.8)
PLATELETS: 270 10*3/uL (ref 145–400)
RBC: 4.59 10*6/uL (ref 3.70–5.45)
RDW: 12.2 % (ref 11.2–14.5)
WBC: 7.1 10*3/uL (ref 3.9–10.3)
lymph#: 1.1 10*3/uL (ref 0.9–3.3)
nRBC: 0 % (ref 0–0)

## 2015-08-05 LAB — COMPREHENSIVE METABOLIC PANEL
ALT: 24 U/L (ref 0–55)
ANION GAP: 10 meq/L (ref 3–11)
AST: 25 U/L (ref 5–34)
Albumin: 4.1 g/dL (ref 3.5–5.0)
Alkaline Phosphatase: 104 U/L (ref 40–150)
BUN: 17 mg/dL (ref 7.0–26.0)
CALCIUM: 10.1 mg/dL (ref 8.4–10.4)
CHLORIDE: 100 meq/L (ref 98–109)
CO2: 25 mEq/L (ref 22–29)
CREATININE: 0.9 mg/dL (ref 0.6–1.1)
EGFR: 74 mL/min/{1.73_m2} — AB (ref >90)
Glucose: 71 mg/dl (ref 70–140)
POTASSIUM: 4.2 meq/L (ref 3.5–5.1)
Sodium: 136 mEq/L (ref 136–145)
Total Bilirubin: 0.6 mg/dL (ref 0.20–1.20)
Total Protein: 7.8 g/dL (ref 6.4–8.3)

## 2015-08-06 LAB — CANCER ANTIGEN 27.29: CAN 27.29: 154.2 U/mL — AB (ref 0.0–38.6)

## 2015-08-06 LAB — CANCER ANTIGEN 27-29 (PARALLEL TESTING): CA 27.29: 151 U/mL — AB (ref <38)

## 2015-08-11 ENCOUNTER — Other Ambulatory Visit: Payer: 59

## 2015-08-11 ENCOUNTER — Ambulatory Visit (HOSPITAL_BASED_OUTPATIENT_CLINIC_OR_DEPARTMENT_OTHER): Payer: 59

## 2015-08-11 ENCOUNTER — Ambulatory Visit (HOSPITAL_BASED_OUTPATIENT_CLINIC_OR_DEPARTMENT_OTHER): Payer: 59 | Admitting: Oncology

## 2015-08-11 ENCOUNTER — Telehealth: Payer: Self-pay | Admitting: Oncology

## 2015-08-11 VITALS — BP 131/64 | HR 67 | Temp 97.5°F | Resp 18 | Ht 61.0 in | Wt 109.7 lb

## 2015-08-11 DIAGNOSIS — C7951 Secondary malignant neoplasm of bone: Secondary | ICD-10-CM | POA: Diagnosis not present

## 2015-08-11 DIAGNOSIS — R300 Dysuria: Secondary | ICD-10-CM

## 2015-08-11 DIAGNOSIS — C50312 Malignant neoplasm of lower-inner quadrant of left female breast: Secondary | ICD-10-CM

## 2015-08-11 DIAGNOSIS — M545 Low back pain: Secondary | ICD-10-CM

## 2015-08-11 DIAGNOSIS — C50912 Malignant neoplasm of unspecified site of left female breast: Secondary | ICD-10-CM

## 2015-08-11 DIAGNOSIS — M810 Age-related osteoporosis without current pathological fracture: Secondary | ICD-10-CM | POA: Diagnosis not present

## 2015-08-11 LAB — URINALYSIS, MICROSCOPIC - CHCC
BILIRUBIN (URINE): NEGATIVE
BLOOD: NEGATIVE
GLUCOSE UR CHCC: NEGATIVE mg/dL
Ketones: NEGATIVE mg/dL
LEUKOCYTE ESTERASE: NEGATIVE
NITRITE: NEGATIVE
PH: 7 (ref 4.6–8.0)
Protein: NEGATIVE mg/dL
SPECIFIC GRAVITY, URINE: 1.005 (ref 1.003–1.035)
Urobilinogen, UR: 0.2 mg/dL (ref 0.2–1)
WBC, UA: NEGATIVE (ref 0–2)
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
## 2015-08-11 NOTE — Telephone Encounter (Signed)
Scheduled patient appt per pof, avs report printed.  °

## 2015-08-12 ENCOUNTER — Telehealth: Payer: Self-pay

## 2015-08-12 LAB — URINE CULTURE: Organism ID, Bacteria: NO GROWTH

## 2015-08-12 NOTE — Telephone Encounter (Signed)
Writer called and LVM regarding urinalysis results.  Per Dr. [name] urinalysis is negative, culture is pending however he does not feel that there will be any growth.  Patient encouraged to call back with questions.

## 2015-08-17 ENCOUNTER — Telehealth: Payer: Self-pay | Admitting: *Deleted

## 2015-08-17 NOTE — Telephone Encounter (Signed)
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  Per message pt will need additional treatment and is inquiring about scheduled scan in April.  " should I wait until I am no longer infected ?"  Per MD review pt may proceed with scan as scheduled- but also per discretion.  This RN returned call to pt and obtained identified VM- message left per above with this RN name for return call.

## 2015-09-16 ENCOUNTER — Other Ambulatory Visit: Payer: Self-pay | Admitting: Oncology

## 2015-09-16 ENCOUNTER — Other Ambulatory Visit (HOSPITAL_COMMUNITY): Payer: Self-pay | Admitting: General Surgery

## 2015-09-16 ENCOUNTER — Ambulatory Visit (HOSPITAL_COMMUNITY)
Admission: RE | Admit: 2015-09-16 | Discharge: 2015-09-16 | Disposition: A | Discharge status: Home / Self Care | Payer: 59 | Source: Ambulatory Visit | Attending: Oncology | Admitting: Oncology

## 2015-09-16 DIAGNOSIS — C50912 Malignant neoplasm of unspecified site of left female breast: Secondary | ICD-10-CM | POA: Insufficient documentation

## 2015-09-16 DIAGNOSIS — C7951 Secondary malignant neoplasm of bone: Secondary | ICD-10-CM | POA: Insufficient documentation

## 2015-09-16 DIAGNOSIS — R938 Abnormal findings on diagnostic imaging of other specified body structures: Secondary | ICD-10-CM | POA: Insufficient documentation

## 2015-09-16 DIAGNOSIS — C50312 Malignant neoplasm of lower-inner quadrant of left female breast: Secondary | ICD-10-CM | POA: Diagnosis present

## 2015-09-16 DIAGNOSIS — K629 Disease of anus and rectum, unspecified: Secondary | ICD-10-CM

## 2015-09-16 LAB — POCT I-STAT CREATININE: Creatinine, Ser: 0.8 mg/dL (ref 0.44–1.00)

## 2015-09-16 MED ORDER — GADOBENATE DIMEGLUMINE 529 MG/ML IV SOLN
10.0000 mL | Freq: Once | INTRAVENOUS | Status: AC | PRN
  Administered 2015-09-16: 10 mL via INTRAVENOUS

## 2015-09-18 ENCOUNTER — Other Ambulatory Visit: Payer: Self-pay | Admitting: Oncology

## 2015-09-18 ENCOUNTER — Telehealth: Payer: Self-pay | Admitting: *Deleted

## 2015-09-18 NOTE — Telephone Encounter (Signed)
Call returned to pt per results of MRI.

## 2015-09-18 NOTE — Progress Notes (Unsigned)
I called Sonia Hill with the results of her MRI, which are of concern. I think she needs a deeper biopsy. She is in agreement. I have contacted Dr. Marcello Moores to get that set up.

## 2015-09-29 ENCOUNTER — Telehealth: Payer: Self-pay

## 2015-09-29 NOTE — Telephone Encounter (Signed)
Patient called regarding appt for biopsy.  Per Dr. [name] patient should hear from [facility]- Dr. Jonita Albee.  Dr. [name] emailed MD regarding patient.   Writer called patient back and stated that Dr. [name] was working on contacting the physician.

## 2015-09-30 ENCOUNTER — Other Ambulatory Visit: Payer: Self-pay | Admitting: Oncology

## 2015-09-30 NOTE — Progress Notes (Unsigned)
Sonia Hill was out of town and her home number was down. Her surgeon Dr. Marcello Moores did not have her mobile. They have been trying to get her to schedule the biopsy of the rectal area. I got [name] on the phone today and she will call Dr. Manon Hilding office tomorrow. Hopefully we can get this done in the near future.

## 2015-10-05 ENCOUNTER — Other Ambulatory Visit (HOSPITAL_COMMUNITY): Payer: Self-pay | Admitting: General Surgery

## 2015-10-05 DIAGNOSIS — K629 Disease of anus and rectum, unspecified: Secondary | ICD-10-CM

## 2015-10-06 ENCOUNTER — Other Ambulatory Visit: Payer: Self-pay

## 2015-10-06 DIAGNOSIS — C50312 Malignant neoplasm of lower-inner quadrant of left female breast: Secondary | ICD-10-CM

## 2015-10-06 DIAGNOSIS — C50912 Malignant neoplasm of unspecified site of left female breast: Secondary | ICD-10-CM

## 2015-10-06 DIAGNOSIS — C7951 Secondary malignant neoplasm of bone: Principal | ICD-10-CM

## 2015-10-07 ENCOUNTER — Other Ambulatory Visit: Payer: 59

## 2015-10-07 ENCOUNTER — Other Ambulatory Visit (HOSPITAL_BASED_OUTPATIENT_CLINIC_OR_DEPARTMENT_OTHER): Payer: 59

## 2015-10-07 DIAGNOSIS — C7951 Secondary malignant neoplasm of bone: Secondary | ICD-10-CM

## 2015-10-07 DIAGNOSIS — C50312 Malignant neoplasm of lower-inner quadrant of left female breast: Secondary | ICD-10-CM

## 2015-10-07 DIAGNOSIS — C50912 Malignant neoplasm of unspecified site of left female breast: Secondary | ICD-10-CM

## 2015-10-07 LAB — CBC WITH DIFFERENTIAL/PLATELET
BASO%: 0.6 % (ref 0.0–2.0)
Basophils Absolute: 0 10*3/uL (ref 0.0–0.1)
EOS%: 0.5 % (ref 0.0–7.0)
Eosinophils Absolute: 0 10*3/uL (ref 0.0–0.5)
HEMATOCRIT: 43.2 % (ref 34.8–46.6)
HGB: 14.3 g/dL (ref 11.6–15.9)
LYMPH#: 0.8 10*3/uL — AB (ref 0.9–3.3)
LYMPH%: 11.9 % — AB (ref 14.0–49.7)
MCH: 29.9 pg (ref 25.1–34.0)
MCHC: 33.2 g/dL (ref 31.5–36.0)
MCV: 90 fL (ref 79.5–101.0)
MONO#: 0.3 10*3/uL (ref 0.1–0.9)
MONO%: 4.9 % (ref 0.0–14.0)
NEUT#: 5.6 10*3/uL (ref 1.5–6.5)
NEUT%: 82.1 % — AB (ref 38.4–76.8)
Platelets: 272 10*3/uL (ref 145–400)
RBC: 4.8 10*6/uL (ref 3.70–5.45)
RDW: 13.1 % (ref 11.2–14.5)
WBC: 6.8 10*3/uL (ref 3.9–10.3)

## 2015-10-07 LAB — COMPREHENSIVE METABOLIC PANEL
ALBUMIN: 4.1 g/dL (ref 3.5–5.0)
ALK PHOS: 93 U/L (ref 40–150)
ALT: 20 U/L (ref 0–55)
AST: 23 U/L (ref 5–34)
Anion Gap: 8 mEq/L (ref 3–11)
BILIRUBIN TOTAL: 0.73 mg/dL (ref 0.20–1.20)
BUN: 13.9 mg/dL (ref 7.0–26.0)
CO2: 27 mEq/L (ref 22–29)
CREATININE: 0.9 mg/dL (ref 0.6–1.1)
Calcium: 10.2 mg/dL (ref 8.4–10.4)
Chloride: 101 mEq/L (ref 98–109)
EGFR: 74 mL/min/{1.73_m2} — AB (ref >90)
GLUCOSE: 80 mg/dL (ref 70–140)
Potassium: 4.8 mEq/L (ref 3.5–5.1)
Sodium: 136 mEq/L (ref 136–145)
TOTAL PROTEIN: 7.2 g/dL (ref 6.4–8.3)

## 2015-10-08 ENCOUNTER — Other Ambulatory Visit: Payer: Self-pay | Admitting: Radiology

## 2015-10-09 ENCOUNTER — Encounter (HOSPITAL_COMMUNITY): Payer: Self-pay

## 2015-10-09 ENCOUNTER — Ambulatory Visit (HOSPITAL_COMMUNITY)
Admission: RE | Admit: 2015-10-09 | Discharge: 2015-10-09 | Disposition: A | Discharge status: Home / Self Care | Payer: 59 | Source: Ambulatory Visit | Attending: General Surgery | Admitting: General Surgery

## 2015-10-09 DIAGNOSIS — Z882 Allergy status to sulfonamides status: Secondary | ICD-10-CM | POA: Diagnosis not present

## 2015-10-09 DIAGNOSIS — E785 Hyperlipidemia, unspecified: Secondary | ICD-10-CM | POA: Insufficient documentation

## 2015-10-09 DIAGNOSIS — K219 Gastro-esophageal reflux disease without esophagitis: Secondary | ICD-10-CM | POA: Insufficient documentation

## 2015-10-09 DIAGNOSIS — Z853 Personal history of malignant neoplasm of breast: Secondary | ICD-10-CM | POA: Insufficient documentation

## 2015-10-09 DIAGNOSIS — K629 Disease of anus and rectum, unspecified: Secondary | ICD-10-CM | POA: Insufficient documentation

## 2015-10-09 DIAGNOSIS — Z803 Family history of malignant neoplasm of breast: Secondary | ICD-10-CM | POA: Diagnosis not present

## 2015-10-09 DIAGNOSIS — Z79899 Other long term (current) drug therapy: Secondary | ICD-10-CM | POA: Insufficient documentation

## 2015-10-09 DIAGNOSIS — M810 Age-related osteoporosis without current pathological fracture: Secondary | ICD-10-CM | POA: Diagnosis not present

## 2015-10-09 DIAGNOSIS — M799 Soft tissue disorder, unspecified: Secondary | ICD-10-CM | POA: Diagnosis present

## 2015-10-09 DIAGNOSIS — E039 Hypothyroidism, unspecified: Secondary | ICD-10-CM | POA: Insufficient documentation

## 2015-10-09 DIAGNOSIS — C786 Secondary malignant neoplasm of retroperitoneum and peritoneum: Secondary | ICD-10-CM | POA: Diagnosis not present

## 2015-10-09 DIAGNOSIS — N189 Chronic kidney disease, unspecified: Secondary | ICD-10-CM | POA: Insufficient documentation

## 2015-10-09 LAB — PROTIME-INR
INR: 0.95 (ref 0.00–1.49)
PROTHROMBIN TIME: 12.9 s (ref 11.6–15.2)

## 2015-10-09 LAB — CBC WITH DIFFERENTIAL/PLATELET
BASOS ABS: 0.1 10*3/uL (ref 0.0–0.1)
BASOS PCT: 2 %
EOS ABS: 0 10*3/uL (ref 0.0–0.7)
EOS PCT: 1 %
HEMATOCRIT: 42.4 % (ref 36.0–46.0)
Hemoglobin: 14.4 g/dL (ref 12.0–15.0)
Lymphocytes Relative: 20 %
Lymphs Abs: 1.5 10*3/uL (ref 0.7–4.0)
MCH: 29.9 pg (ref 26.0–34.0)
MCHC: 34 g/dL (ref 30.0–36.0)
MCV: 88.1 fL (ref 78.0–100.0)
MONO ABS: 0.4 10*3/uL (ref 0.1–1.0)
MONOS PCT: 5 %
Neutro Abs: 5.3 10*3/uL (ref 1.7–7.7)
Neutrophils Relative %: 72 %
PLATELETS: 270 10*3/uL (ref 150–400)
RBC: 4.81 MIL/uL (ref 3.87–5.11)
RDW: 12.2 % (ref 11.5–15.5)
WBC: 7.3 10*3/uL (ref 4.0–10.5)

## 2015-10-09 MED ORDER — SODIUM CHLORIDE 0.9 % IV SOLN
INTRAVENOUS | Status: DC
  Administered 2015-10-09: 10:00:00 via INTRAVENOUS

## 2015-10-09 MED ORDER — FENTANYL CITRATE (PF) 100 MCG/2ML IJ SOLN
INTRAMUSCULAR | Status: AC | PRN
  Administered 2015-10-09: 50 ug via INTRAVENOUS
  Administered 2015-10-09: 25 ug via INTRAVENOUS

## 2015-10-09 MED ORDER — MIDAZOLAM HCL 2 MG/2ML IJ SOLN
INTRAMUSCULAR | Status: AC
  Filled 2015-10-09: qty 4

## 2015-10-09 MED ORDER — FENTANYL CITRATE (PF) 100 MCG/2ML IJ SOLN
INTRAMUSCULAR | Status: AC
  Filled 2015-10-09: qty 4

## 2015-10-09 MED ORDER — MIDAZOLAM HCL 2 MG/2ML IJ SOLN
INTRAMUSCULAR | Status: AC | PRN
  Administered 2015-10-09 (×2): 0.5 mg via INTRAVENOUS
  Administered 2015-10-09: 1 mg via INTRAVENOUS

## 2015-10-09 MED ORDER — LIDOCAINE HCL 1 % IJ SOLN
INTRAMUSCULAR | Status: AC
  Filled 2015-10-09: qty 20

## 2015-10-09 NOTE — Sedation Documentation (Addendum)
Noted pt's rhythm to change from sinus rhythm to a ventricular rhythm. Pt talking and states she feels ok.

## 2015-10-09 NOTE — H&P (Signed)
Chief Complaint: Patient was seen in consultation today for presacral soft tissue mass biopsy at the request of Paraje  Referring Physician(s): Thomas,Alicia  Supervising Physician: Marybelle Killings  Patient Status: Out-pt  History of Present Illness: Sonia Hill is a 60 y.o. female   Pt with Hx Breast Ca Noted new rectal lesion; presacral soft tissue mass on follow up PET 05/2015 MR 09/16/2015: IMPRESSION: 1. Again identified is abnormal wall thickening with enhancement involving the rectum and presacral soft tissues. No discrete mass and no adenopathy identified. Findings are nonspecific. However, given the absence of obstructive symptoms, as well as no discrete mass and no evidence of adenopathy these results are favored to represent an inflammatory process such as ulcerative colitis. Correlation with colonoscopy results and tissue sampling if Indicated.  Request for soft tissue mass biopsy  Past Medical History  Diagnosis Date  . SVD (spontaneous vaginal delivery)     x 1  . PONV (postoperative nausea and vomiting)   . Hypothyroidism   . Hyperlipidemia     diet controlled  . Depression     Hx - no current problem  . Chronic kidney disease     Only has right kidney  . GERD (gastroesophageal reflux disease)     diet controlled - no meds  . Arthritis     neck, upper back, shoulders - no meds - yoga  . lt breast ca dx'd 11/2009  . Osteoporosis, unspecified 12/06/2013    Past Surgical History  Procedure Laterality Date  . Breast surgery      masectomy left breast  . Bladder neck reconstruction      made a new urethea tube  . Left kidney removed      left kidney dysfunction  . Bladder tact  2008  . Wisdom tooth extraction    . Dilation and curettage of uterus    . Coloscopy    . Laparoscopy N/A 01/24/2013    Procedure: LAPROSCOPY OPERATIVE;  Surgeon: Margarette Asal, MD;  Location: [facility];  Service: Gynecology;  Laterality: N/A;  .  Salpingoophorectomy Bilateral 01/24/2013    Procedure: SALPINGO OOPHORECTOMY;  Surgeon: Margarette Asal, MD;  Location: [facility];  Service: Gynecology;  Laterality: Bilateral;    Allergies: Adhesive; Betadine; Mercury; and Sulfa antibiotics  Medications: Prior to Admission medications   Medication Sig Start Date End Date Taking? Authorizing Provider  ascorbic acid (VITAMIN C) 1000 MG tablet Take 1,000 mg by mouth 3 (three) times daily.    Yes Historical Provider, MD  Ashwagandha 125 MG CAPS Take 125 mg by mouth daily.  03/23/15  Yes Chauncey Cruel, MD  Cholecalciferol (VITAMIN D-3) 5000 UNITS TABS Take 5,000 Units by mouth daily.    Yes Historical Provider, MD  FOLIC ACID PO Take 123XX123 mcg by mouth daily.    Yes Historical Provider, MD  Nyoka Cowden Tea, Camillia sinensis, (GREEN TEA EXTRACT PO) Take 1 tablet by mouth daily.   Yes Historical Provider, MD  MAGNESIUM PO Take 325 mg by mouth at bedtime as needed (leg cramps). Magnesium calm - powder   Yes Historical Provider, MD  Multiple Vitamin (MULTIVITAMIN WITH MINERALS) TABS tablet Take 1 tablet by mouth daily.   Yes Historical Provider, MD  Multiple Vitamins-Minerals (HAIR/SKIN/NAILS/BIOTIN) TABS Take 1 tablet by mouth daily.   Yes Historical Provider, MD  niacin 250 MG tablet Take 250 mg by mouth at bedtime.   Yes Historical Provider, MD  OVER THE COUNTER MEDICATION Take 12.5 mg by mouth  daily. Iodoral Iodine  supplement   Yes Historical Provider, MD  Probiotic Product (PROBIOTIC DAILY PO) Take 1 tablet by mouth daily.    Yes Historical Provider, MD  Strontium Chloride CRYS Take 300 mg by mouth daily.   Yes Historical Provider, MD  thyroid (NATURE-THROID) 65 MG tablet Take 65 mg by mouth daily.   Yes Historical Provider, MD  Omega-3 Fatty Acids (FISH OIL) 1200 MG CAPS Take 1,200 mg by mouth 2 (two) times daily.    Historical Provider, MD     Family History  Problem Relation Age of Onset  . Breast cancer Mother 47  . Breast cancer Maternal  Aunt 58  . Breast cancer Other     MGF's sister dx with breast cancer in her 10s    Social History   Social History  . Marital Status: Married    Spouse Name: N/A  . Number of Children: N/A  . Years of Education: N/A   Social History Main Topics  . Smoking status: Never Smoker   . Smokeless tobacco: Never Used  . Alcohol Use: No  . Drug Use: No  . Sexual Activity: Yes    Birth Control/ Protection: Post-menopausal   Other Topics Concern  . None   Social History Narrative     Review of Systems: A 12 point ROS discussed and pertinent positives are indicated in the HPI above.  All other systems are negative.  Review of Systems  Constitutional: Negative for fever, activity change and fatigue.  Respiratory: Negative for shortness of breath.   Gastrointestinal: Negative for abdominal pain.  Neurological: Negative for weakness.  Psychiatric/Behavioral: Negative for behavioral problems and confusion.    Vital Signs: BP 119/66 mmHg  Pulse 66  Temp(Src) 98.7 F (37.1 C)  Resp 18  Ht 5\' 1"  (1.549 m)  Wt 110 lb (49.896 kg)  BMI 20.80 kg/m2  SpO2 100%  Physical Exam  Constitutional: She is oriented to person, place, and time.  Cardiovascular: Normal rate, regular rhythm and normal heart sounds.   Pulmonary/Chest: Effort normal and breath sounds normal. She has no wheezes.  Abdominal: Soft. Bowel sounds are normal. There is no tenderness.  Musculoskeletal: Normal range of motion.  Neurological: She is alert and oriented to person, place, and time.  Skin: Skin is warm and dry.  Psychiatric: She has a normal mood and affect. Her behavior is normal. Judgment and thought content normal.  Nursing note and vitals reviewed.   Mallampati Score:  MD Evaluation Airway: WNL Heart: WNL Abdomen: WNL Chest/ Lungs: WNL ASA  Classification: 3 Mallampati/Airway Score: One  Imaging: Mr Pelvis W Wo Contrast  09/16/2015  CLINICAL DATA:  History of metastatic breast cancer. EXAM:  MRI PELVIS WITHOUT AND WITH CONTRAST TECHNIQUE: Multiplanar multisequence MR imaging of the pelvis was performed both before and after administration of intravenous contrast. CONTRAST:  28mL MULTIHANCE GADOBENATE DIMEGLUMINE 529 MG/ML IV SOLN COMPARISON:  06/05/2015 FINDINGS: Urinary Tract: The urinary bladder appears within normal limits. Bowel: There is abnormal circumferential wall thickening involving the rectum. Single wall thickness measures up to 2 cm, image 19 of series 9. There is diffuse enhancement of the thickened rectal wall, image 13 of series 12. There is soft tissue stranding within the perirectal fat and increased soft tissue thickening with enhancement in the presacral soft tissues, image number 14 of series 13. No discrete mass identified. Vascular/Lymphatic: The iliac vasculature appears patent. No pelvic adenopathy identified. Reproductive: No mass identified. Other: There is no free fluid or fluid  collections identified. Musculoskeletal: No abnormal areas of signal or enhancement within the visualized osseous structures. IMPRESSION: 1. Again identified is abnormal wall thickening with enhancement involving the rectum and presacral soft tissues. No discrete mass and no adenopathy identified. Findings are nonspecific. However, given the absence of obstructive symptoms, as well as no discrete mass and no evidence of adenopathy these results are favored to represent an inflammatory process such as ulcerative colitis. Correlation with colonoscopy results and tissue sampling if indicated. Electronically Signed   By: Kerby Moors M.D.   On: 09/16/2015 12:25    Labs:  CBC:  Recent Labs  05/01/15 1308 05/29/15 0921 08/05/15 1134 10/07/15 1102  WBC 7.4 6.6 7.1 6.8  HGB 13.6 14.2 14.1 14.3  HCT 40.3 43.0 40.4 43.2  PLT 267 292 270 272    COAGS: No results for input(s): INR, APTT in the last 8760 hours.  BMP:  Recent Labs  05/01/15 1308 05/29/15 0922 08/05/15 1134  09/16/15 1033 10/07/15 1102  NA 132* 139 136  --  136  K 3.9 4.5 4.2  --  4.8  CO2 27 26 25   --  27  GLUCOSE 79 85 71  --  80  BUN 12.5 15.0 17.0  --  13.9  CALCIUM 9.7 10.5* 10.1  --  10.2  CREATININE 0.8 0.9 0.9 0.80 0.9    LIVER FUNCTION TESTS:  Recent Labs  05/01/15 1308 05/29/15 0922 08/05/15 1134 10/07/15 1102  BILITOT 0.65 0.70 0.60 0.73  AST 25 25 25 23   ALT 24 25 24 20   ALKPHOS 100 106 104 93  PROT 6.6 7.4 7.8 7.2  ALBUMIN 3.7 4.1 4.1 4.1    TUMOR MARKERS: No results for input(s): AFPTM, CEA, CA199, CHROMGRNA in the last 8760 hours.  Assessment and Plan:  Hx breast cancer MR showing presacral soft tissue mass and rectal lesion----as previously described on PET 05/2015 Now scheduled for biopsy of presacral mass Risks and Benefits discussed with the patient including, but not limited to bleeding, infection, damage to adjacent structures or low yield requiring additional tests. All of the patient's questions were answered, patient is agreeable to proceed. Consent signed and in chart.   Thank you for this interesting consult.  I greatly enjoyed meeting MARILYNN MEROLA and look forward to participating in their care.  A copy of this report was sent to the requesting provider on this date.  Electronically Signed: Monia Sabal A 10/09/2015, 10:37 AM   I spent a total of  30 Minutes   in face to face in clinical consultation, greater than 50% of which was counseling/coordinating care for presacral soft tissue mass bx

## 2015-10-09 NOTE — Sedation Documentation (Addendum)
Pt awake and alert. In no distress. NSR. States she is ready to go home. Bandaid to buttocks intact. D/C'd home in wheelchair with husband.

## 2015-10-09 NOTE — Sedation Documentation (Signed)
Pt answering questions. Rhythm changing from sinus to ventricular rhythm. Pt with no complains.

## 2015-10-09 NOTE — Procedures (Signed)
Presacral Bx 18 g Bx times two No comp/EBL

## 2015-10-09 NOTE — Sedation Documentation (Signed)
Pt awake and alert. In no distress. Sitting up eating crackers. Tolerating well. Remains in NSR. Husband at bedside.

## 2015-10-09 NOTE — Sedation Documentation (Signed)
Procedure completed. Pt to go to nurse's station for 12 lead EKG. Awake and Alert. In no distress

## 2015-10-12 ENCOUNTER — Other Ambulatory Visit: Payer: Self-pay | Admitting: Oncology

## 2015-10-12 ENCOUNTER — Encounter: Payer: 59 | Admitting: Oncology

## 2015-10-12 ENCOUNTER — Telehealth: Payer: Self-pay | Admitting: Oncology

## 2015-10-12 NOTE — Telephone Encounter (Signed)
s.w. pt and advised on todays appt cx and moved to 5.5.Marland KitchenMarland KitchenMarland Kitchenpt ok and aware

## 2015-10-13 ENCOUNTER — Other Ambulatory Visit: Payer: Self-pay | Admitting: Oncology

## 2015-10-13 NOTE — Progress Notes (Signed)
ID: LAURICE IGLESIA   DOB: November 16, 1955  MR#: [id number]  AJG#:[id number]  Patient Care Team: [name], MD as PCP - General (Family Medicine) Chauncey Cruel, MD as Consulting Physician (Oncology) Molli Posey, MD as Consulting Physician (Obstetrics and Gynecology) Laurence Spates, MD as Consulting Physician (Gastroenterology) Leighton Ruff, MD as Consulting Physician (General Surgery) Delrae Rend, MD as Consulting Physician (Endocrinology)   CHIEF COMPLAINT: Metastatic breast cancer, estrogen receptor positive  CURRENT TREATMENT: fulvestrant, palbociclib.  BREAST CANCER HISTORY:  From the initial intake note:  Anshu had screening mammography on 11/10/2009 showing very dense tissue with a possible distortion in the left breast.  Digital mammography on June 10th showed a 1.5 cm cluster of microcalcifications in the lower inner portion of the breast.  On physical exam, there was no palpable mass and no palpable axillary adenopathy.  There was some nipple inversion inferolaterally.  Ultrasound showed a 2.6 cm irregular mass-like area with dense posterior acoustic shadowing in the 3 o'clock position, 1 cm from the nipple, but normal appearing left axillary lymph nodes.    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  A second mass noted on ultrasound was also lobular, also low grade.  Both were ER and PR positive at well over 90%.  Both had low proliferation markers at 9%, and both were Her-2 negative by CISH with ratios of 1.6 and 1.24.  In short, both masses were nearly identical.    On June 16th, the patient had bilateral breast MRIs which showed in the left breast at 3 o'clock an irregular area of abnormal enhancement measuring up to 2.7 cm and in the left lower inner quadrant, a second post-biopsy area measuring 1.5 cm.  The difference  between these two areas was stated at the conference the morning of the visit to be approximately 5 cm.  Given this data the patient opted for Left mastectomy with sentinel lymph node sampling, performed July 2011 with results as detailed below.  Subsequent history is as detailed below.  INTERVAL HISTORY: Edee returns for followup of her stage IV breast canceraccompanied by her husband [name] and her  Sister Mechele Claude. To summarize the recent history: We have noted a rising CA-27-29, which led to a PET scan 06/05/2015. This showed a mildly hypermetabolic rectal wall thickening, associated with hypermetabolic presacral soft tissue. This was felt possibly to be due to proctitis and the patient was evaluated and treated for tapeworm by her primary care physician. However on pelvic MRI 09/16/2015 the abnormal rectal wall thickening and presacral soft tissue areas were again identified. This was felt to be possibly an inflammatory process, but on 10/09/2015 biopsy was obtained, and this showed ([id number]) metastatic lobular breast cancer, which was estrogen receptor 90% positive, with moderate staining intensity. HER-2 was not amplified, with a signals ratio of 1.28, and the number per cell being 2.05. This was morphologically identical to the patient's 2011.  Vergene is here today to discuss these results.    REVIEW OF SYSTEMS: Vaudine is having regular bowel movements, for example to yesterday, and they are soft. They are however "finger thin", not her prior normal diameter. She remains fatigued. She denies unusual headaches, visual changes, nausea, vomiting, cough, phlegm production, or pleurisy. She denies pain. There has been no rash or bleeding. A detailed review of systems today was otherwise noncontributory  PAST MEDICAL HISTORY: Past Medical  History  Diagnosis Date  . SVD (spontaneous vaginal delivery)     x 1  . PONV (postoperative nausea and vomiting)   . Hypothyroidism   . Hyperlipidemia      diet controlled  . Depression     Hx - no current problem  . Chronic kidney disease     Only has right kidney  . GERD (gastroesophageal reflux disease)     diet controlled - no meds  . Arthritis     neck, upper back, shoulders - no meds - yoga  . lt breast ca dx'd 11/2009  . Osteoporosis, unspecified 12/06/2013  1. Osteopenia. 2. Congenital left renal atrophy.  3. History of recurrent UTIs. 4. History of bladder reconstruction for reflux more than 18 years ago. 5. History of depression and anxiety. 6. History of hypothyroidism.  7. History of "sling procedure".  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    FAMILY HISTORY The patient's father is alive in his early 90s.  He has Alzheimer's disease. The patient's mother was diagnosed with breast cancer at the age of 55.  She died at the age of 61.  The patient's father had one out of two sisters with breast cancer.  There are other breast cancers on the mother's side.  The patient has one brother and one younger sister, neither with cancer.    GYNECOLOGIC HISTORY:  (Reviewed 12/06/2013) She is GX P1.  That pregnancy was at age 36.  Menarche was at age 13.  She was perimenopausal at the time of diagnosis but is now convincingly postmenopausal  SOCIAL HISTORY:  (Reviewed 12/06/2013) She works as a speech pathologist, part-time and on consultation.  Her husband, Tom, is a technical supervisor at [employer].  [name] works as a teacher in [place].  The patient's son Eddie is  in college.  The patient attends [employer].   ADVANCED DIRECTIVES: in place  HEALTH MAINTENANCE: (Updated 12/06/2013)  Social History  Substance Use Topics  . Smoking status: Never Smoker   . Smokeless tobacco: Never Used  . Alcohol Use: No     Colonoscopy: 06/18/2015; [facility]  PAP: UTD/May 2015, Dr. Holland  Bone density: [name] 2014, osteoporosis  Lipid panel: UTD (Not on  file)    Allergies  Allergen Reactions  . Adhesive [Tape] Rash    Ok to use paper tape and tegaderm over IV site  . Betadine [Povidone Iodine] Rash  . Mercury Rash    Reaction mercurachrome  . Sulfa Antibiotics Rash    Current Outpatient Prescriptions  Medication Sig Dispense Refill  . ascorbic acid (VITAMIN C) 1000 MG tablet Take 1,000 mg by mouth 3 (three) times daily.     . Ashwagandha 125 MG CAPS Take 125 mg by mouth daily.     . Cholecalciferol (VITAMIN D-3) 5000 UNITS TABS Take 5,000 Units by mouth daily.     . FOLIC ACID PO Take 2,000 mcg by mouth daily.     . Green Tea, Camillia sinensis, (GREEN TEA EXTRACT PO) Take 1 tablet by mouth daily.    . MAGNESIUM PO Take 325 mg by mouth at bedtime as needed (leg cramps). Magnesium calm - powder    . Multiple Vitamin (MULTIVITAMIN WITH MINERALS) TABS tablet Take 1 tablet by mouth daily.    . Multiple Vitamins-Minerals (HAIR/SKIN/NAILS/BIOTIN) TABS Take 1 tablet by mouth daily.    . niacin 250 MG tablet Take 250 mg by   mouth at bedtime.    . Omega-3 Fatty Acids (FISH OIL) 1200 MG CAPS Take 1,200 mg by mouth 2 (two) times daily.    Marland Kitchen OVER THE COUNTER MEDICATION Take 12.5 mg by mouth daily. Iodoral Iodine  supplement    . palbociclib (IBRANCE) 125 MG capsule Take 1 capsule (125 mg total) by mouth daily with breakfast. Take whole with food. 21 capsule 6  . Probiotic Product (PROBIOTIC DAILY PO) Take 1 tablet by mouth daily.     . Strontium Chloride CRYS Take 300 mg by mouth daily.    Marland Kitchen thyroid (NATURE-THROID) 65 MG tablet Take 65 mg by mouth daily.     No current facility-administered medications for this visit.    OBJECTIVE: Middle aged white woman who appears stated age 72 Vitals:   10/16/15 1612  BP: 127/62  Pulse: 69  Temp: 98.3 F (36.8 C)  Resp: 20     Body mass index is 20.74 kg/(m^2).    ECOG FS: 1 Filed Weights   10/16/15 1612  Weight: 109 lb 11.2 oz (49.76 kg)   Sclerae unicteric, pupils round and  equal Oropharynx clear and moist-- no thrush or other lesions No cervical or supraclavicular adenopathy Lungs no rales or rhonchi Heart regular rate and rhythm Abd soft, nontender, positive bowel sounds MSK no focal spinal tenderness, no upper extremity lymphedema Neuro: nonfocal, well oriented, appropriate affect Breasts: Deferred    LAB RESULTS:      Ref Range 81moago    CA 27.29 0.0 - 38.6 U/mL 154.2 (H)          Lab Results  Component Value Date   WBC 7.3 10/09/2015   NEUTROABS 5.3 10/09/2015   HGB 14.4 10/09/2015   HCT 42.4 10/09/2015   MCV 88.1 10/09/2015   PLT 270 10/09/2015      Chemistry      Component Value Date/Time   NA 136 10/07/2015 1102   NA 140 03/23/2012 1557   K 4.8 10/07/2015 1102   K 3.7 03/23/2012 1557   CL 98 12/03/2012 1419   CL 102 03/23/2012 1557   CO2 27 10/07/2015 1102   CO2 28 03/23/2012 1557   BUN 13.9 10/07/2015 1102   BUN 12 03/23/2012 1557   CREATININE 0.9 10/07/2015 1102   CREATININE 0.80 09/16/2015 1033      Component Value Date/Time   CALCIUM 10.2 10/07/2015 1102   CALCIUM 10.2 06/09/2014 1152   CALCIUM 10.1 08/01/2011 1415   ALKPHOS 93 10/07/2015 1102   ALKPHOS 93 03/23/2012 1557   AST 23 10/07/2015 1102   AST 33 03/23/2012 1557   ALT 20 10/07/2015 1102   ALT 33 03/23/2012 1557   BILITOT 0.73 10/07/2015 1102   BILITOT 0.5 03/23/2012 1557      Lab Results  Component Value Date   LABCA2 151* 08/05/2015    STUDIES: Ct Biopsy  10/09/2015  INDICATION: Presacral mass.  Abnormal PET-CT. EXAM: CT BIOPSY MEDICATIONS: None. ANESTHESIA/SEDATION: Fentanyl 75 mcg IV; Versed 2 mg IV Moderate Sedation Time:  15 The patient was continuously monitored during the procedure by the interventional radiology nurse under my direct supervision. FLUOROSCOPY TIME:  Fluoroscopy Time:  minutes  seconds ( mGy). COMPLICATIONS: None immediate. PROCEDURE: Informed written consent was obtained from the patient after a thorough discussion of  the procedural risks, benefits and alternatives. All questions were addressed. Maximal Sterile Barrier Technique was utilized including caps, mask, sterile gowns, sterile gloves, sterile drape, hand hygiene and skin antiseptic. A timeout was  performed prior to the initiation of the procedure. Under CT guidance, a(n) 17 gauge guide needle was advanced into the presacral mass via right trans gluteal approach. Subsequently 2 18 gauge core biopsies were obtained. The guide needle was removed. Post biopsy images demonstrate no evidence of hemorrhage. Patient tolerated the procedure well without complication. Vital sign monitoring by nursing staff during the procedure will continue as patient is in the special procedures unit for post procedure observation. FINDINGS: The images document guide needle placement within the presacral mass. Post biopsy images demonstrate no hemorrhage. IMPRESSION: Successful CT-guided presacral mass biopsy. Electronically Signed   By: Arthur  Hoss M.D.   On: 10/09/2015 14:53     ASSESSMENT: 59 y.o.  BRCA 1-2 negative [place] woman  (1) status post left mastectomy and sentinel lymph node sampling in July 2011 for a T1 N1 M1, stage IV invasive lobular breast cancer, grade 1, strongly estrogen and progesterone receptor-positive, HER2 negative with MIB-1 of 9% and no HER2 amplification,   (2) with multiple sclerotic bone lesions at presentation seen only on CT scan (not on bone scan or PET scan), but  with biopsy-proven metastatic disease to bone; and an elevated CA 27.29 at presentation,   (3) Oncotype recurrence score of 4, predicts a good response to antiestrogens.  (4) Systemic treatment has consisted of  a) tamoxifen with evidence of response but poor tolerance  b) letrozole starting August 2012, discontinued October 2014 per patient   (5) single functioning kidney  (6) status post bilateral salpingo-oophorectomy 01/24/2013, with benign pathology  (7) osteoporosis; the  patient refuses bisphosphonate therapy; started osteostrong February 2014.    (a) bone density was obtained under the care of Dr. Holland at [facility], 06/25/2012, showing osteoporosis with a T score -2.6. This was repeated 12/20/2013, showing again osteoporosis with T-scores between -2.4 and -2.8.  (8) the patient refuses standard Mammography or tomography; undergoing thermography screening of the right breast.  (9) PET scan 06/05/2015 shows rectal thickening and a presacral mass; biopsy of this area 10/09/2015 confirms metastatic lobular breast cancer, again estrogen receptor positive, HER-2 nonamplified.  (10) to start fulvestrant and palbociclib 10/23/2015   PLAN:  I spent approximately one hour with Adessa and her family going over her situation. She understands the symptoms she is having regarding rectal involvement by her lobular breast cancer will only worsen if it is not treated. This can result in blockage and if that develops then she would likely need extensive surgery and a permanent colostomy. We could also do radiation, but that would make the surgical field very difficult so that would only be done if she could not tolerate surgery for some reason  The better option the local treatment is systemic therapy. She had difficulty with tamoxifen, which cause depression, she feels, and with letrozole, which caused arthralgias and myalgias. We are going to try fulvestrant. We discussed the possible side effects, toxicities and complications of this agent but it is generally very well tolerated. She will receive her first dose May 12, a.dose 2 weeks and 4 weeks later, and after that it would be every 4 weeks.  We are going to couple this with palbociclib. We discussed the mechanism of action and she understands neither of these drugs will "take away her estrogen". Palbociclib has been shown to essentially double the time to progression when paired with fulvestrant. We  discussed the possible toxicities, side effects and complications of this agent and we are going to be   following her counts on a weekly basis initially until we find the correct dose for her.  At some point she will simply come here once a month to get her shots and her labs. Initially however she will need very close monitoring.  Once she has been on fulvestrant approximately 4 months we can consider a PET scan. We will be following the CA-27-29 monthly, but she knows this is not immediately responsive and we can see a rise in the CA-27-29 initially even if the treatment is going to work.  Judy is considering all this. She is praying about it. She is inclined to accept this treatment. She agreed to being scheduled for the first treatment May 12 and we will try to get the palbociclib approved for her by that date.  In any case she knows to call for any problems that may develop before her next visit here.  , C, MD     10/16/2015   

## 2015-10-13 NOTE — Progress Notes (Unsigned)
I called Sonia Hill today and let her know the results of the biopsy, which show metastatic lobular breast cancer, estrogen receptor positive. She has an appointment to see me this Friday and we will discuss treatment options at that time.  I discussed the possibility of local treatment with Dr. Marcello Moores. Here is what she rode back:  "Could do an APR with removal of the pre-sacral tissue as well, but the chances for R0 resection would be low, and she would be left with a large wound and permanent ostomy. If she wanted to go that route, I would probably refer to tertiary center for second opinion. I am not a fan of chiseling on the sacrum and there would be potential to have to do a pelvic exenteration. "

## 2015-10-16 ENCOUNTER — Ambulatory Visit (HOSPITAL_BASED_OUTPATIENT_CLINIC_OR_DEPARTMENT_OTHER): Payer: 59 | Admitting: Oncology

## 2015-10-16 VITALS — BP 127/62 | HR 69 | Temp 98.3°F | Resp 20 | Ht 61.0 in | Wt 109.7 lb

## 2015-10-16 DIAGNOSIS — R5383 Other fatigue: Secondary | ICD-10-CM

## 2015-10-16 DIAGNOSIS — C50919 Malignant neoplasm of unspecified site of unspecified female breast: Secondary | ICD-10-CM | POA: Insufficient documentation

## 2015-10-16 DIAGNOSIS — C50912 Malignant neoplasm of unspecified site of left female breast: Secondary | ICD-10-CM

## 2015-10-16 DIAGNOSIS — M810 Age-related osteoporosis without current pathological fracture: Secondary | ICD-10-CM

## 2015-10-16 DIAGNOSIS — C7951 Secondary malignant neoplasm of bone: Secondary | ICD-10-CM | POA: Diagnosis not present

## 2015-10-16 DIAGNOSIS — C50312 Malignant neoplasm of lower-inner quadrant of left female breast: Secondary | ICD-10-CM | POA: Diagnosis not present

## 2015-10-16 MED ORDER — PALBOCICLIB 125 MG PO CAPS: 125.0000 mg | ORAL_CAPSULE | Freq: Every day | ORAL | Status: DC

## 2015-10-19 ENCOUNTER — Telehealth: Payer: Self-pay | Admitting: Oncology

## 2015-10-19 ENCOUNTER — Other Ambulatory Visit: Payer: Self-pay | Admitting: Oncology

## 2015-10-19 DIAGNOSIS — C7951 Secondary malignant neoplasm of bone: Principal | ICD-10-CM

## 2015-10-19 DIAGNOSIS — C50912 Malignant neoplasm of unspecified site of left female breast: Secondary | ICD-10-CM

## 2015-10-19 MED ORDER — PALBOCICLIB 125 MG PO CAPS: 125.0000 mg | ORAL_CAPSULE | Freq: Every day | ORAL | Status: DC

## 2015-10-19 NOTE — Telephone Encounter (Signed)
Spoke with patient to confirm 5/12 appt date/time. Pt will come to scheduling on Friday to get updated copy of schduel

## 2015-10-20 ENCOUNTER — Encounter: Payer: Self-pay | Admitting: Oncology

## 2015-10-20 NOTE — Progress Notes (Signed)
Sent prior auth for [employer] caremark-covermymeds

## 2015-10-23 ENCOUNTER — Other Ambulatory Visit (HOSPITAL_BASED_OUTPATIENT_CLINIC_OR_DEPARTMENT_OTHER): Payer: 59

## 2015-10-23 ENCOUNTER — Ambulatory Visit (HOSPITAL_BASED_OUTPATIENT_CLINIC_OR_DEPARTMENT_OTHER): Payer: 59

## 2015-10-23 ENCOUNTER — Encounter: Payer: Self-pay | Admitting: Oncology

## 2015-10-23 VITALS — BP 122/76 | HR 72 | Temp 97.9°F | Resp 20

## 2015-10-23 DIAGNOSIS — M810 Age-related osteoporosis without current pathological fracture: Secondary | ICD-10-CM

## 2015-10-23 DIAGNOSIS — C7951 Secondary malignant neoplasm of bone: Secondary | ICD-10-CM

## 2015-10-23 DIAGNOSIS — C50312 Malignant neoplasm of lower-inner quadrant of left female breast: Secondary | ICD-10-CM | POA: Diagnosis not present

## 2015-10-23 DIAGNOSIS — C50912 Malignant neoplasm of unspecified site of left female breast: Secondary | ICD-10-CM

## 2015-10-23 DIAGNOSIS — Z5111 Encounter for antineoplastic chemotherapy: Secondary | ICD-10-CM

## 2015-10-23 DIAGNOSIS — Z1379 Encounter for other screening for genetic and chromosomal anomalies: Secondary | ICD-10-CM

## 2015-10-23 DIAGNOSIS — C50919 Malignant neoplasm of unspecified site of unspecified female breast: Secondary | ICD-10-CM

## 2015-10-23 LAB — COMPREHENSIVE METABOLIC PANEL
ALT: 20 U/L (ref 0–55)
AST: 23 U/L (ref 5–34)
Albumin: 4.1 g/dL (ref 3.5–5.0)
Alkaline Phosphatase: 93 U/L (ref 40–150)
Anion Gap: 9 mEq/L (ref 3–11)
BUN: 10.7 mg/dL (ref 7.0–26.0)
CO2: 27 meq/L (ref 22–29)
Calcium: 10.2 mg/dL (ref 8.4–10.4)
Chloride: 100 mEq/L (ref 98–109)
Creatinine: 0.9 mg/dL (ref 0.6–1.1)
EGFR: 72 mL/min/{1.73_m2} — ABNORMAL LOW (ref >90)
GLUCOSE: 94 mg/dL (ref 70–140)
POTASSIUM: 4.5 meq/L (ref 3.5–5.1)
SODIUM: 135 meq/L — AB (ref 136–145)
Total Bilirubin: 0.59 mg/dL (ref 0.20–1.20)
Total Protein: 7.3 g/dL (ref 6.4–8.3)

## 2015-10-23 LAB — CBC WITH DIFFERENTIAL/PLATELET
BASO%: 0.2 % (ref 0.0–2.0)
BASOS ABS: 0 10*3/uL (ref 0.0–0.1)
EOS ABS: 0.1 10*3/uL (ref 0.0–0.5)
EOS%: 0.8 % (ref 0.0–7.0)
HEMATOCRIT: 40.9 % (ref 34.8–46.6)
HEMOGLOBIN: 14.3 g/dL (ref 11.6–15.9)
LYMPH#: 1.2 10*3/uL (ref 0.9–3.3)
LYMPH%: 18.4 % (ref 14.0–49.7)
MCH: 30.4 pg (ref 25.1–34.0)
MCHC: 35 g/dL (ref 31.5–36.0)
MCV: 86.8 fL (ref 79.5–101.0)
MONO#: 0.3 10*3/uL (ref 0.1–0.9)
MONO%: 5.3 % (ref 0.0–14.0)
NEUT#: 4.7 10*3/uL (ref 1.5–6.5)
NEUT%: 75.3 % (ref 38.4–76.8)
PLATELETS: 295 10*3/uL (ref 145–400)
RBC: 4.71 10*6/uL (ref 3.70–5.45)
RDW: 12.2 % (ref 11.2–14.5)
WBC: 6.3 10*3/uL (ref 3.9–10.3)

## 2015-10-23 MED ORDER — FULVESTRANT 250 MG/5ML IM SOLN
500.0000 mg | INTRAMUSCULAR | Status: DC
  Administered 2015-10-23: 500 mg via INTRAMUSCULAR
  Filled 2015-10-23: qty 10

## 2015-10-23 NOTE — Patient Instructions (Signed)

## 2015-10-23 NOTE — Progress Notes (Signed)
Per [facility] ibrance approved 10/22/15-10/21/16.  Pa# [employer] [id number] sent to medical records

## 2015-10-24 LAB — CANCER ANTIGEN 27-29 (PARALLEL TESTING): CA 27.29: 181 U/mL — ABNORMAL HIGH (ref <38)

## 2015-10-24 LAB — CANCER ANTIGEN 27.29: CA 27.29: 163.4 U/mL — ABNORMAL HIGH (ref 0.0–38.6)

## 2015-10-24 LAB — CA 125: Cancer Antigen (CA) 125: 49.3 U/mL — ABNORMAL HIGH (ref 0.0–38.1)

## 2015-10-24 LAB — CEA: CEA: 20.6 ng/mL — ABNORMAL HIGH (ref 0.0–4.7)

## 2015-10-27 ENCOUNTER — Encounter: Payer: Self-pay | Admitting: Pharmacist

## 2015-10-27 NOTE — Progress Notes (Signed)
Oral Chemotherapy Pharmacist Encounter   I spoke with patient for overview of new oral chemotherapy medication: Ibrance. Pt is doing well. [facility]. Prior auth received and copay card activated. Patient should receive medication today 5/16 and will likely start on 5/17 or 5/18.   Counseled patient on administration, dosing, side effects, safe handling, and monitoring. Side effects include but not limited to: Fatigue, nausea, lower blood counts, bruising, peripheral neuropathy, alopecia, mouth sores.  Ms. Sonia Hill voiced understanding and appreciation.   All questions answered. She has had a lot going on this week as she is also taking care of her husband who just got out of the hospital.   Will follow up in 1 week for adherence and toxicity management.   Thank you,  Montel Clock, PharmD, [facility]

## 2015-10-28 ENCOUNTER — Telehealth: Payer: Self-pay | Admitting: Pharmacist

## 2015-10-28 NOTE — Telephone Encounter (Signed)
Pt called Sonia Hill today stating she just received her Ibrance yesterday (delay due to insur approval and delivery by [employer] to her pharmacy).  She just took her 1st dose of Ibrance this morning (10/28/15).  She is due for labs here on Fri (10/30/15).  She wonders if she should cancel this appt since she just started [place]. I was able to s/w Hinda Lenis, RN (Dr. [name] out of office today) and we will cancel her lab appt for this Friday and pt is already sched for lab/provider visit/Inj (Faslodex load) next Friday (11/06/15) so pt is aware we'll just do labwork then. Pt also had a few other questions about her tx after she s/w Montel Clock, Pharm.D yesterday re: vaginal bleeding and hot flashes.  I helped her understand these agents still have potential, although low, to cause vaginal bleeding & hot flashes.  Their mechanism of blocking estrogen is the cause for these sxs.  She stated she had problems in the past w/ Tamoxifen causing vaginal bleeding within 1st 2 months of use and she did not tolerate the drug.  This is still a risk w/ Faslodex and may occur within the 1st 6 weeks (1% incidence). She has a friend w/ same dx who is taking Kisqail (ribociclib) + Letrozole.  She wondered the differences in these txs and we discussed that they are very similar in their mechanisms. We have already planned to touch base again w/ pt over phone on 11/05/15 and I encouraged her that if she has any concerns before then she can call our oral chemo [facility]. Kennith Center, Pharm.D., CPP 10/28/2015@9 :36 AM

## 2015-10-30 ENCOUNTER — Other Ambulatory Visit: Payer: 59

## 2015-11-06 ENCOUNTER — Telehealth: Payer: Self-pay

## 2015-11-06 ENCOUNTER — Encounter: Payer: Self-pay | Admitting: Nurse Practitioner

## 2015-11-06 ENCOUNTER — Ambulatory Visit (HOSPITAL_BASED_OUTPATIENT_CLINIC_OR_DEPARTMENT_OTHER): Payer: 59 | Admitting: Nurse Practitioner

## 2015-11-06 ENCOUNTER — Ambulatory Visit (HOSPITAL_BASED_OUTPATIENT_CLINIC_OR_DEPARTMENT_OTHER): Payer: 59

## 2015-11-06 ENCOUNTER — Telehealth: Payer: Self-pay | Admitting: Nurse Practitioner

## 2015-11-06 ENCOUNTER — Other Ambulatory Visit (HOSPITAL_BASED_OUTPATIENT_CLINIC_OR_DEPARTMENT_OTHER): Payer: 59

## 2015-11-06 VITALS — BP 153/78 | HR 70 | Temp 97.9°F | Resp 19 | Wt 108.9 lb

## 2015-11-06 DIAGNOSIS — C50312 Malignant neoplasm of lower-inner quadrant of left female breast: Secondary | ICD-10-CM

## 2015-11-06 DIAGNOSIS — K5900 Constipation, unspecified: Secondary | ICD-10-CM

## 2015-11-06 DIAGNOSIS — M810 Age-related osteoporosis without current pathological fracture: Secondary | ICD-10-CM | POA: Diagnosis not present

## 2015-11-06 DIAGNOSIS — C7951 Secondary malignant neoplasm of bone: Secondary | ICD-10-CM

## 2015-11-06 DIAGNOSIS — Z5111 Encounter for antineoplastic chemotherapy: Secondary | ICD-10-CM

## 2015-11-06 DIAGNOSIS — C50912 Malignant neoplasm of unspecified site of left female breast: Secondary | ICD-10-CM

## 2015-11-06 DIAGNOSIS — F439 Reaction to severe stress, unspecified: Secondary | ICD-10-CM

## 2015-11-06 DIAGNOSIS — Z1379 Encounter for other screening for genetic and chromosomal anomalies: Secondary | ICD-10-CM

## 2015-11-06 DIAGNOSIS — C50919 Malignant neoplasm of unspecified site of unspecified female breast: Secondary | ICD-10-CM

## 2015-11-06 LAB — CBC WITH DIFFERENTIAL/PLATELET
BASO%: 0.8 % (ref 0.0–2.0)
BASOS ABS: 0 10*3/uL (ref 0.0–0.1)
EOS%: 0.9 % (ref 0.0–7.0)
Eosinophils Absolute: 0 10*3/uL (ref 0.0–0.5)
HEMATOCRIT: 39 % (ref 34.8–46.6)
HGB: 13.3 g/dL (ref 11.6–15.9)
LYMPH%: 23.1 % (ref 14.0–49.7)
MCH: 30.3 pg (ref 25.1–34.0)
MCHC: 34.1 g/dL (ref 31.5–36.0)
MCV: 89.1 fL (ref 79.5–101.0)
MONO#: 0.2 10*3/uL (ref 0.1–0.9)
MONO%: 4.8 % (ref 0.0–14.0)
NEUT#: 2.6 10*3/uL (ref 1.5–6.5)
NEUT%: 70.4 % (ref 38.4–76.8)
Platelets: 269 10*3/uL (ref 145–400)
RBC: 4.38 10*6/uL (ref 3.70–5.45)
RDW: 12.7 % (ref 11.2–14.5)
WBC: 3.7 10*3/uL — ABNORMAL LOW (ref 3.9–10.3)
lymph#: 0.8 10*3/uL — ABNORMAL LOW (ref 0.9–3.3)

## 2015-11-06 MED ORDER — FULVESTRANT 250 MG/5ML IM SOLN
500.0000 mg | INTRAMUSCULAR | Status: DC
  Administered 2015-11-06: 500 mg via INTRAMUSCULAR
  Filled 2015-11-06: qty 10

## 2015-11-06 NOTE — Patient Instructions (Signed)

## 2015-11-06 NOTE — Telephone Encounter (Signed)
Patient saw Verneita Griffes NP today with some complaints of constipation to which Nira Conn gave her bowel habit protocol to assist in bowel movements.  Patient is noted to otherwise be tolerating the Ibrance well.  Will follow up with next visit.  Henreitta Leber, PharmD [facility]

## 2015-11-06 NOTE — Telephone Encounter (Signed)
appt made and avs printed °

## 2015-11-06 NOTE — Progress Notes (Signed)
ID: [facility]   DOB: 04/18/1956  MR#: [id number]  OQH#:[id number]  Patient Care Team: [name], MD as PCP - General (Family Medicine) Chauncey Cruel, MD as Consulting Physician (Oncology) Molli Posey, MD as Consulting Physician (Obstetrics and Gynecology) Laurence Spates, MD as Consulting Physician (Gastroenterology) Leighton Ruff, MD as Consulting Physician (General Surgery) Delrae Rend, MD as Consulting Physician (Endocrinology)   CHIEF COMPLAINT: Metastatic breast cancer, estrogen receptor positive  CURRENT TREATMENT: fulvestrant, palbociclib.  BREAST CANCER HISTORY:  From the initial intake note:  Jalaya had screening mammography on 11/10/2009 showing very dense tissue with a possible distortion in the left breast.  Digital mammography on June 10th showed a 1.5 cm cluster of microcalcifications in the lower inner portion of the breast.  On physical exam, there was no palpable mass and no palpable axillary adenopathy.  There was some nipple inversion inferolaterally.  Ultrasound showed a 2.6 cm irregular mass-like area with dense posterior acoustic shadowing in the 3 o'clock position, 1 cm from the nipple, but normal appearing left axillary lymph nodes.    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  A second mass noted on ultrasound was also lobular, also low grade.  Both were ER and PR positive at well over 90%.  Both had low proliferation markers at 9%, and both were Her-2 negative by CISH with ratios of 1.6 and 1.24.  In short, both masses were nearly identical.    On June 16th, the patient had bilateral breast MRIs which showed in the left breast at 3 o'clock an irregular area of abnormal enhancement measuring up to 2.7 cm and in the left lower inner quadrant, a second post-biopsy area measuring 1.5 cm.  The difference  between these two areas was stated at the conference the morning of the visit to be approximately 5 cm.  Given this data the patient opted for Left mastectomy with sentinel lymph node sampling, performed July 2011 with results as detailed below.  Subsequent history is as detailed below.  INTERVAL HISTORY: Nabiha returns for followup of her stage IV breast cancer, accompanied by a friend. Since her last visit she has started on fulvestrant and palbociclbib. She is due for her next injection today, and it is approximately day 10 of her first cycle of palbociclib. So far is not having any side effect that she is aware of from either drug.   REVIEW OF SYSTEMS: Dannia's main complaint today is continued constipation. She has been taking miralax intermittently, which would produce a bowel movement, but then she would stop taking it and go back to being stopped up. The continue to be "skinny" and are becoming harder to pass. She saw some drops of blood on the toilet tissue after one particular incident of straining. She took her first colace this morning. This distresses her because she has previously been used to going at least twice daily. Otherwise she has no other physical complaints. She is fairly stressed however, because her husband was cutting down a tree which subsequently fell on him. He has suffered a punctured lung, several cracked ribs, and a fractured collar bone that had to be reset this week. She is having to take care of him. This combined with her own diagnosis has her working less, and she wonders if eventually they will be in financial trouble. She expresses to me that she would  like a "counselor' or someone to talk to and relieve her stress. She is also interested in becoming more physically active. She denies unusual headaches, visual changes, nausea, vomiting, cough, phlegm production, or pleurisy. She denies pain. There has been no rash or bleeding. A detailed review of systems today was  otherwise noncontributory  PAST MEDICAL HISTORY: Past Medical History  Diagnosis Date  . SVD (spontaneous vaginal delivery)     x 1  . PONV (postoperative nausea and vomiting)   . Hypothyroidism   . Hyperlipidemia     diet controlled  . Depression     Hx - no current problem  . Chronic kidney disease     Only has right kidney  . GERD (gastroesophageal reflux disease)     diet controlled - no meds  . Arthritis     neck, upper back, shoulders - no meds - yoga  . lt breast ca dx'd 11/2009  . Osteoporosis, unspecified 12/06/2013  1. Osteopenia. 2. Congenital left renal atrophy.  3. History of recurrent UTIs. 4. History of bladder reconstruction for reflux more than 18 years ago. 5. History of depression and anxiety. 6. History of hypothyroidism.  7. History of "sling procedure".  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    FAMILY HISTORY The patient's father is alive in his early 60s.  He has Alzheimer's disease. The patient's mother was diagnosed with breast cancer at the age of 62.  She died at the age of 8.  The patient's father had one out of two sisters with breast cancer.  There are other breast cancers on the mother's side.  The patient has one brother and one younger sister, neither with cancer.    GYNECOLOGIC HISTORY:  (Reviewed 12/06/2013) She is GX P1.  That pregnancy was at age 11.  Menarche was at age 60.  She was perimenopausal at the time of diagnosis but is now convincingly postmenopausal  SOCIAL HISTORY:  (Reviewed 12/06/2013) She works as a Electrical engineer, part-time and on consultation.  Her husband, [name], is a Architect at [employer].  [name] works as a Pharmacist, hospital in [place].  The patient's son [name] is  in college.  The patient attends [employer].   ADVANCED DIRECTIVES: in place  HEALTH MAINTENANCE: (Updated 12/06/2013)  Social History  Substance Use Topics  .  Smoking status: Never Smoker   . Smokeless tobacco: Never Used  . Alcohol Use: No     Colonoscopy: 06/18/2015; [facility]  PAP: UTD/May 2015, Dr. Matthew Saras  Bone density: [name] 2014, osteoporosis  Lipid panel: UTD (Not on file)    Allergies  Allergen Reactions  . Adhesive [Tape] Rash    Ok to use paper tape and tegaderm over IV site  . Betadine [Povidone Iodine] Rash  . Mercury Rash    Reaction mercurachrome  . Sulfa Antibiotics Rash    Current Outpatient Prescriptions  Medication Sig Dispense Refill  . ascorbic acid (VITAMIN C) 1000 MG tablet Take 1,000 mg by mouth 3 (three) times daily.     . Ashwagandha 125 MG CAPS Take 125 mg by mouth daily.     . Cholecalciferol (VITAMIN D-3) 5000 UNITS TABS Take 5,000 Units by mouth daily.     Marland Kitchen FOLIC ACID PO Take 2,334 mcg by mouth daily.     Nyoka Cowden Tea, Camillia sinensis, (GREEN TEA EXTRACT PO) Take 1 tablet by mouth daily.    Marland Kitchen MAGNESIUM  PO Take 325 mg by mouth at bedtime as needed (leg cramps). Magnesium calm - powder    . Multiple Vitamin (MULTIVITAMIN WITH MINERALS) TABS tablet Take 1 tablet by mouth daily.    . Multiple Vitamins-Minerals (HAIR/SKIN/NAILS/BIOTIN) TABS Take 1 tablet by mouth daily.    . niacin 250 MG tablet Take 250 mg by mouth at bedtime.    . Omega-3 Fatty Acids (FISH OIL) 1200 MG CAPS Take 1,200 mg by mouth 2 (two) times daily.    Marland Kitchen OVER THE COUNTER MEDICATION Take 12.5 mg by mouth daily. Iodoral Iodine  supplement    . palbociclib (IBRANCE) 125 MG capsule Take 1 capsule (125 mg total) by mouth daily with breakfast. Take whole with food. 21 capsule 6  . Probiotic Product (PROBIOTIC DAILY PO) Take 1 tablet by mouth daily.     . Strontium Chloride CRYS Take 300 mg by mouth daily.    Marland Kitchen thyroid (NATURE-THROID) 65 MG tablet Take 65 mg by mouth daily.     No current facility-administered medications for this visit.   Facility-Administered Medications Ordered in Other Visits  Medication Dose Route Frequency  Provider Last Rate Last Dose  . fulvestrant (FASLODEX) injection 500 mg  500 mg Intramuscular Q14 Days Chauncey Cruel, MD   500 mg at 11/06/15 1055    OBJECTIVE: Middle aged white woman who appears stated age 45 Vitals:   11/06/15 0933  BP: 153/78  Pulse: 70  Temp: 97.9 F (36.6 C)  Resp: 19     Body mass index is 20.59 kg/(m^2).    ECOG FS: 1 Filed Weights   11/06/15 0933  Weight: 108 lb 14.4 oz (49.397 kg)   Skin: warm, dry  HEENT: sclerae anicteric, conjunctivae pink, oropharynx clear. No thrush or mucositis.  Lymph Nodes: No cervical or supraclavicular lymphadenopathy  Lungs: clear to auscultation bilaterally, no rales, wheezes, or rhonci  Heart: regular rate and rhythm  Abdomen: round, soft, non tender, positive bowel sounds  Musculoskeletal: No focal spinal tenderness, no peripheral edema  Neuro: non focal, well oriented, positive affect  Breasts: deferred   LAB RESULTS:      Ref Range 24moago    CA 27.29 0.0 - 38.6 U/mL 154.2 (H)          Lab Results  Component Value Date   WBC 3.7* 11/06/2015   NEUTROABS 2.6 11/06/2015   HGB 13.3 11/06/2015   HCT 39.0 11/06/2015   MCV 89.1 11/06/2015   PLT 269 11/06/2015      Chemistry      Component Value Date/Time   NA 135* 10/23/2015 1050   NA 140 03/23/2012 1557   K 4.5 10/23/2015 1050   K 3.7 03/23/2012 1557   CL 98 12/03/2012 1419   CL 102 03/23/2012 1557   CO2 27 10/23/2015 1050   CO2 28 03/23/2012 1557   BUN 10.7 10/23/2015 1050   BUN 12 03/23/2012 1557   CREATININE 0.9 10/23/2015 1050   CREATININE 0.80 09/16/2015 1033      Component Value Date/Time   CALCIUM 10.2 10/23/2015 1050   CALCIUM 10.2 06/09/2014 1152   CALCIUM 10.1 08/01/2011 1415   ALKPHOS 93 10/23/2015 1050   ALKPHOS 93 03/23/2012 1557   AST 23 10/23/2015 1050   AST 33 03/23/2012 1557   ALT 20 10/23/2015 1050   ALT 33 03/23/2012 1557   BILITOT 0.59 10/23/2015 1050   BILITOT 0.5 03/23/2012 1557      Lab Results   Component Value Date  LABCA2 181* 10/23/2015    STUDIES: Ct Biopsy  10/09/2015  INDICATION: Presacral mass.  Abnormal PET-CT. EXAM: CT BIOPSY MEDICATIONS: None. ANESTHESIA/SEDATION: Fentanyl 75 mcg IV; Versed 2 mg IV Moderate Sedation Time:  15 The patient was continuously monitored during the procedure by the interventional radiology nurse under my direct supervision. FLUOROSCOPY TIME:  Fluoroscopy Time:  minutes  seconds ( mGy). COMPLICATIONS: None immediate. PROCEDURE: Informed written consent was obtained from the patient after a thorough discussion of the procedural risks, benefits and alternatives. All questions were addressed. Maximal Sterile Barrier Technique was utilized including caps, mask, sterile gowns, sterile gloves, sterile drape, hand hygiene and skin antiseptic. A timeout was performed prior to the initiation of the procedure. Under CT guidance, a(n) 17 gauge guide needle was advanced into the presacral mass via right trans gluteal approach. Subsequently 2 18 gauge core biopsies were obtained. The guide needle was removed. Post biopsy images demonstrate no evidence of hemorrhage. Patient tolerated the procedure well without complication. Vital sign monitoring by nursing staff during the procedure will continue as patient is in the special procedures unit for post procedure observation. FINDINGS: The images document guide needle placement within the presacral mass. Post biopsy images demonstrate no hemorrhage. IMPRESSION: Successful CT-guided presacral mass biopsy. Electronically Signed   By: Marybelle Killings M.D.   On: 10/09/2015 14:53     ASSESSMENT: 60 y.o.  BRCA 1-2 negative [facility] woman  (1) status post left mastectomy and sentinel lymph node sampling in July 2011 for a T1 N1 M1, stage IV invasive lobular breast cancer, grade 1, strongly estrogen and progesterone receptor-positive, HER2 negative with MIB-1 of 9% and no HER2 amplification,   (2) with multiple sclerotic bone  lesions at presentation seen only on CT scan (not on bone scan or PET scan), but  with biopsy-proven metastatic disease to bone; and an elevated CA 27.29 at presentation,   (3) Oncotype recurrence score of 4, predicts a good response to antiestrogens.  (4) Systemic treatment has consisted of  a) tamoxifen with evidence of response but poor tolerance  b) letrozole starting August 2012, discontinued October 2014 per patient   (5) single functioning kidney  (6) status post bilateral salpingo-oophorectomy 01/24/2013, with benign pathology  (7) osteoporosis; the patient refuses bisphosphonate therapy; started [facility] February 2014.    (a) bone density was obtained under the care of Dr. Matthew Saras at [facility], 06/25/2012, showing osteoporosis with a T score -2.6. This was repeated 12/20/2013, showing again osteoporosis with T-scores between -2.4 and -2.8.  (8) the patient refuses standard Mammography or tomography; undergoing thermography screening of the right breast.  (9) PET scan 06/05/2015 shows rectal thickening and a presacral mass; biopsy of this area 10/09/2015 confirms metastatic lobular breast cancer, again estrogen receptor positive, HER-2 nonamplified.  (10) to start fulvestrant and palbociclib 10/23/2015   PLAN:  I wrote out a bowel regimen that I want Kacelyn to stick to for the next week or so. She is going to take 2 colace capsules twice daily and mirlax daily, with the goal of a soft formed bowel movement at least every other day. She is going to continue to stay well hydrated and incorporated fibrous veggies into her diet. She is not to strain while on the toilet, and she will call us if she sees blood that is more than a few drops on the tissue when she wipes.  Otherwise she is tolerating the palbociclib well. The labs were reviewed in detail and her  ANC is still normal. She will receive her next fulvestrant injection today and then start them monthly.    She is interested in more support at home, both mentally and financially. I am having the chaplain give her a call and she has been given our calendar with dates of the various support group meetings. I also pointed out the tai chi and yoga classes held on Wednesdays that she might enjoy. I have placed a referral to social work as well to help identify resources for financial assistance in the future. For now she plans to work at least 2 days a week.   Marquisha will continue labs weekly. She will return in 4 weeks for follow up with Dr. [name]. She understands and agrees with this plan. She knows the goal of treatment in her case is control. She has been encouraged to call with any issues that might arise before her next visit here.    Laurie Panda, NP 11/06/2015

## 2015-11-10 ENCOUNTER — Encounter: Payer: Self-pay | Admitting: General Practice

## 2015-11-10 NOTE — Progress Notes (Signed)
Spiritual Care Note  Sonia Hill by phone per referral from [facility] Boelter/NP and [facility] Dove/LPN. She verbalized appreciation for [facility]'s responsiveness in arranging emotional support.  Provided support and encouragement, particularly normalizing feelings of distress related to the multiple stressors she shared (her own dx/px, plus husband's recent serious injury) and the potential value of seeking support to process.  We set a 1:1 appt for Monday, June 5 at [facility] Shelva Majestic, [facility] Pager [phone] Voicemail [phone]

## 2015-11-13 ENCOUNTER — Other Ambulatory Visit (HOSPITAL_BASED_OUTPATIENT_CLINIC_OR_DEPARTMENT_OTHER): Payer: 59

## 2015-11-13 DIAGNOSIS — C50312 Malignant neoplasm of lower-inner quadrant of left female breast: Secondary | ICD-10-CM

## 2015-11-13 DIAGNOSIS — C50912 Malignant neoplasm of unspecified site of left female breast: Secondary | ICD-10-CM

## 2015-11-13 DIAGNOSIS — M810 Age-related osteoporosis without current pathological fracture: Secondary | ICD-10-CM

## 2015-11-13 DIAGNOSIS — C7951 Secondary malignant neoplasm of bone: Secondary | ICD-10-CM

## 2015-11-13 LAB — CBC WITH DIFFERENTIAL/PLATELET
BASO%: 1 % (ref 0.0–2.0)
BASOS ABS: 0 10*3/uL (ref 0.0–0.1)
EOS%: 2.4 % (ref 0.0–7.0)
Eosinophils Absolute: 0.1 10*3/uL (ref 0.0–0.5)
HEMATOCRIT: 36.3 % (ref 34.8–46.6)
HGB: 12.2 g/dL (ref 11.6–15.9)
LYMPH%: 31.5 % (ref 14.0–49.7)
MCH: 30.3 pg (ref 25.1–34.0)
MCHC: 33.7 g/dL (ref 31.5–36.0)
MCV: 89.9 fL (ref 79.5–101.0)
MONO#: 0.1 10*3/uL (ref 0.1–0.9)
MONO%: 3.3 % (ref 0.0–14.0)
NEUT#: 1.8 10*3/uL (ref 1.5–6.5)
NEUT%: 61.8 % (ref 38.4–76.8)
PLATELETS: 188 10*3/uL (ref 145–400)
RBC: 4.04 10*6/uL (ref 3.70–5.45)
RDW: 12.8 % (ref 11.2–14.5)
WBC: 2.9 10*3/uL — ABNORMAL LOW (ref 3.9–10.3)
lymph#: 0.9 10*3/uL (ref 0.9–3.3)

## 2015-11-16 ENCOUNTER — Encounter: Payer: Self-pay | Admitting: General Practice

## 2015-11-16 NOTE — Progress Notes (Signed)
Spiritual Care Note  Met with Sonia Hill for 1h session, bearing witness to her story of her cancer and her husband's recent injury.  She used opportunity well to process and reflect, identifying her own goals for own coping and meaning-making, including trying biofeedback for stress management, developing a hobby for meaning-making/enjoyment, and exploring physical/creative means for self-expression.  Encouraged her to utilize [employer], reviewing schedule together.  [name] identified interest in yoga (has past experience), tai chi, healing garden classes, and others.  She is working hard to address her own disease progression while also shifting from care receiver to caregiver as she supports her spouse through injury recovery and other health needs.  Per pt, immediate family has supportive attitude, but limited ability to respond with action.  She reports emotional support from church and finds meaning and comfort in prayer.  We scheduled f/u appt for Monday, July 10 at [facility].  In the meantime, she plans to review Spiritual Care brochure to get creative about how she would most like to spend the hour; current plan is some verbal processing and an experience with progressive relaxation as a potential tool for self-soothing/refocusing/grounding in the present moment.  She has also verbalized interest in using labyrinth in healing garden for personal prayer/meditation practice.  Please also page if needs arise/circumstances change.  Thank you.  [place], [place], [facility] Pager [phone] Voicemail [phone]

## 2015-11-19 ENCOUNTER — Ambulatory Visit (INDEPENDENT_AMBULATORY_CARE_PROVIDER_SITE_OTHER): Payer: 59

## 2015-11-19 ENCOUNTER — Ambulatory Visit (INDEPENDENT_AMBULATORY_CARE_PROVIDER_SITE_OTHER): Payer: 59 | Admitting: Podiatry

## 2015-11-19 ENCOUNTER — Encounter: Payer: Self-pay | Admitting: Podiatry

## 2015-11-19 VITALS — BP 97/50 | HR 75 | Resp 16 | Ht 61.0 in | Wt 108.0 lb

## 2015-11-19 DIAGNOSIS — B351 Tinea unguium: Secondary | ICD-10-CM | POA: Diagnosis not present

## 2015-11-19 DIAGNOSIS — M2041 Other hammer toe(s) (acquired), right foot: Secondary | ICD-10-CM

## 2015-11-19 DIAGNOSIS — M79671 Pain in right foot: Secondary | ICD-10-CM

## 2015-11-19 NOTE — Progress Notes (Signed)
Subjective:     Patient ID: Sonia Hill, female   DOB: 24-[name]-1956, 60 y.o.   MRN: [id number]  HPI patient states that she's having some irritation between between the fourth and fifth toes on the right foot with some skin that has thick and   Review of Systems  All other systems reviewed and are negative.      Objective:   Physical Exam  Constitutional: She is oriented to person, place, and time.  Cardiovascular: Intact distal pulses.   Musculoskeletal: Normal range of motion.  Neurological: She is oriented to person, place, and time.  Skin: Skin is warm.  Nursing note and vitals reviewed.  neurovascular status intact muscle strength adequate with patient found to have a keratotic lesion the fourth interspace right foot with discoloration but no drainage and minimal discomfort. Patient also does have active cancer and is under aggressive treatment with stage IV tight metastasis. Patient is noted to have good digital perfusion is well oriented 3 and has had no other previous foot problems     Assessment:     Inflammatory interspace lesion fourth interspace right with probable compression of the fourth and fifth digits    Plan:     H&P and conditions reviewed with patient. At this point I went ahead and I debrided the area and I reviewed x-rays and I do not recommend surgery and less it were to become painful  X-ray report indicated the head of the proximal phalanx fifth digit is probably pressing against the base of the fourth toe right creating the pain

## 2015-11-19 NOTE — Progress Notes (Signed)
   Subjective:    Patient ID: Sonia Hill, female    DOB: 08-11-1955, 60 y.o.   MRN: [id number]  HPI Chief Complaint  Patient presents with  . Toe Pain    Right foot; interdigital 4th & 5th toe; pt stated, "has knot at base of 2nd toe; had swelling in toe first, then had pain and that's when noticed lump"; x3 weeks  . Nail Problem    Bilateral; great toes; nail discoloration & thickened nails; pt stated, "wants to discuss laser treatment"; x6 months      Review of Systems  Constitutional: Positive for fatigue.  HENT: Positive for hearing loss and nosebleeds.   Cardiovascular: Positive for palpitations.  All other systems reviewed and are negative.      Objective:   Physical Exam        Assessment & Plan:

## 2015-11-20 ENCOUNTER — Other Ambulatory Visit: Payer: Self-pay | Admitting: *Deleted

## 2015-11-20 ENCOUNTER — Telehealth: Payer: Self-pay | Admitting: *Deleted

## 2015-11-20 ENCOUNTER — Ambulatory Visit: Payer: 59

## 2015-11-20 ENCOUNTER — Other Ambulatory Visit (HOSPITAL_BASED_OUTPATIENT_CLINIC_OR_DEPARTMENT_OTHER): Payer: 59

## 2015-11-20 DIAGNOSIS — M810 Age-related osteoporosis without current pathological fracture: Secondary | ICD-10-CM

## 2015-11-20 DIAGNOSIS — C50312 Malignant neoplasm of lower-inner quadrant of left female breast: Secondary | ICD-10-CM

## 2015-11-20 DIAGNOSIS — C50912 Malignant neoplasm of unspecified site of left female breast: Secondary | ICD-10-CM

## 2015-11-20 DIAGNOSIS — C7951 Secondary malignant neoplasm of bone: Secondary | ICD-10-CM

## 2015-11-20 LAB — CBC WITH DIFFERENTIAL/PLATELET
BASO%: 0.4 % (ref 0.0–2.0)
BASOS ABS: 0 10*3/uL (ref 0.0–0.1)
EOS ABS: 0 10*3/uL (ref 0.0–0.5)
EOS%: 1.3 % (ref 0.0–7.0)
HEMATOCRIT: 35.7 % (ref 34.8–46.6)
HGB: 12.7 g/dL (ref 11.6–15.9)
LYMPH#: 0.9 10*3/uL (ref 0.9–3.3)
LYMPH%: 37.6 % (ref 14.0–49.7)
MCH: 31.6 pg (ref 25.1–34.0)
MCHC: 35.6 g/dL (ref 31.5–36.0)
MCV: 88.8 fL (ref 79.5–101.0)
MONO#: 0.1 10*3/uL (ref 0.1–0.9)
MONO%: 4.9 % (ref 0.0–14.0)
NEUT#: 1.3 10*3/uL — ABNORMAL LOW (ref 1.5–6.5)
NEUT%: 55.8 % (ref 38.4–76.8)
PLATELETS: 126 10*3/uL — AB (ref 145–400)
RBC: 4.02 10*6/uL (ref 3.70–5.45)
RDW: 12.8 % (ref 11.2–14.5)
WBC: 2.3 10*3/uL — ABNORMAL LOW (ref 3.9–10.3)

## 2015-11-20 MED ORDER — PALBOCICLIB 100 MG PO CAPS: ORAL_CAPSULE | ORAL | Status: DC

## 2015-11-20 NOTE — Telephone Encounter (Signed)
Lab today ANC 1.3.  Pt to restart Ibrance on 6/14.  Per MD - pt to resume at lowered dose of 100 mg tablets.  Discussed with pt- prescription sent to pt's mail order pharmacy.

## 2015-11-26 NOTE — Telephone Encounter (Signed)
No entry 

## 2015-11-27 ENCOUNTER — Other Ambulatory Visit (HOSPITAL_BASED_OUTPATIENT_CLINIC_OR_DEPARTMENT_OTHER): Payer: 59

## 2015-11-27 DIAGNOSIS — C7951 Secondary malignant neoplasm of bone: Secondary | ICD-10-CM

## 2015-11-27 DIAGNOSIS — M810 Age-related osteoporosis without current pathological fracture: Secondary | ICD-10-CM

## 2015-11-27 DIAGNOSIS — C50912 Malignant neoplasm of unspecified site of left female breast: Secondary | ICD-10-CM

## 2015-11-27 DIAGNOSIS — C50312 Malignant neoplasm of lower-inner quadrant of left female breast: Secondary | ICD-10-CM

## 2015-11-27 LAB — CBC WITH DIFFERENTIAL/PLATELET
BASO%: 0.7 % (ref 0.0–2.0)
BASOS ABS: 0 10*3/uL (ref 0.0–0.1)
EOS ABS: 0 10*3/uL (ref 0.0–0.5)
EOS%: 1.2 % (ref 0.0–7.0)
HCT: 36.2 % (ref 34.8–46.6)
HEMOGLOBIN: 12.3 g/dL (ref 11.6–15.9)
LYMPH%: 26.2 % (ref 14.0–49.7)
MCH: 31.1 pg (ref 25.1–34.0)
MCHC: 34 g/dL (ref 31.5–36.0)
MCV: 91.4 fL (ref 79.5–101.0)
MONO#: 0.3 10*3/uL (ref 0.1–0.9)
MONO%: 11 % (ref 0.0–14.0)
NEUT#: 1.7 10*3/uL (ref 1.5–6.5)
NEUT%: 60.9 % (ref 38.4–76.8)
PLATELETS: 222 10*3/uL (ref 145–400)
RBC: 3.96 10*6/uL (ref 3.70–5.45)
RDW: 13.8 % (ref 11.2–14.5)
WBC: 2.8 10*3/uL — ABNORMAL LOW (ref 3.9–10.3)
lymph#: 0.7 10*3/uL — ABNORMAL LOW (ref 0.9–3.3)

## 2015-12-04 ENCOUNTER — Ambulatory Visit (HOSPITAL_BASED_OUTPATIENT_CLINIC_OR_DEPARTMENT_OTHER): Payer: 59 | Admitting: Oncology

## 2015-12-04 ENCOUNTER — Other Ambulatory Visit (HOSPITAL_BASED_OUTPATIENT_CLINIC_OR_DEPARTMENT_OTHER): Payer: 59

## 2015-12-04 ENCOUNTER — Telehealth: Payer: Self-pay | Admitting: Oncology

## 2015-12-04 ENCOUNTER — Encounter: Payer: Self-pay | Admitting: Pharmacist

## 2015-12-04 ENCOUNTER — Ambulatory Visit (HOSPITAL_BASED_OUTPATIENT_CLINIC_OR_DEPARTMENT_OTHER): Payer: 59

## 2015-12-04 VITALS — BP 131/78 | HR 72 | Temp 97.5°F | Resp 18 | Ht 61.0 in | Wt 108.6 lb

## 2015-12-04 DIAGNOSIS — M810 Age-related osteoporosis without current pathological fracture: Secondary | ICD-10-CM | POA: Diagnosis not present

## 2015-12-04 DIAGNOSIS — C50912 Malignant neoplasm of unspecified site of left female breast: Secondary | ICD-10-CM

## 2015-12-04 DIAGNOSIS — C50312 Malignant neoplasm of lower-inner quadrant of left female breast: Secondary | ICD-10-CM

## 2015-12-04 DIAGNOSIS — C7951 Secondary malignant neoplasm of bone: Secondary | ICD-10-CM

## 2015-12-04 DIAGNOSIS — K5900 Constipation, unspecified: Secondary | ICD-10-CM

## 2015-12-04 DIAGNOSIS — M545 Low back pain: Secondary | ICD-10-CM

## 2015-12-04 DIAGNOSIS — Z5111 Encounter for antineoplastic chemotherapy: Secondary | ICD-10-CM | POA: Diagnosis not present

## 2015-12-04 LAB — CBC WITH DIFFERENTIAL/PLATELET
BASO%: 1 % (ref 0.0–2.0)
Basophils Absolute: 0 10*3/uL (ref 0.0–0.1)
EOS ABS: 0 10*3/uL (ref 0.0–0.5)
EOS%: 0.3 % (ref 0.0–7.0)
HCT: 38.1 % (ref 34.8–46.6)
HGB: 13.5 g/dL (ref 11.6–15.9)
LYMPH%: 25.6 % (ref 14.0–49.7)
MCH: 32.2 pg (ref 25.1–34.0)
MCHC: 35.4 g/dL (ref 31.5–36.0)
MCV: 90.9 fL (ref 79.5–101.0)
MONO#: 0.1 10*3/uL (ref 0.1–0.9)
MONO%: 3.8 % (ref 0.0–14.0)
NEUT%: 69.3 % (ref 38.4–76.8)
NEUTROS ABS: 2.2 10*3/uL (ref 1.5–6.5)
Platelets: 360 10*3/uL (ref 145–400)
RBC: 4.19 10*6/uL (ref 3.70–5.45)
RDW: 14.9 % — ABNORMAL HIGH (ref 11.2–14.5)
WBC: 3.1 10*3/uL — AB (ref 3.9–10.3)
lymph#: 0.8 10*3/uL — ABNORMAL LOW (ref 0.9–3.3)

## 2015-12-04 LAB — COMPREHENSIVE METABOLIC PANEL
ALT: 23 U/L (ref 0–55)
AST: 24 U/L (ref 5–34)
Albumin: 4.1 g/dL (ref 3.5–5.0)
Alkaline Phosphatase: 82 U/L (ref 40–150)
Anion Gap: 8 mEq/L (ref 3–11)
BILIRUBIN TOTAL: 0.64 mg/dL (ref 0.20–1.20)
BUN: 14.8 mg/dL (ref 7.0–26.0)
CO2: 28 meq/L (ref 22–29)
Calcium: 10.2 mg/dL (ref 8.4–10.4)
Chloride: 103 mEq/L (ref 98–109)
Creatinine: 1.1 mg/dL (ref 0.6–1.1)
EGFR: 53 mL/min/{1.73_m2} — AB (ref >90)
GLUCOSE: 80 mg/dL (ref 70–140)
Potassium: 4.4 mEq/L (ref 3.5–5.1)
SODIUM: 139 meq/L (ref 136–145)
TOTAL PROTEIN: 7.3 g/dL (ref 6.4–8.3)

## 2015-12-04 MED ORDER — FULVESTRANT 250 MG/5ML IM SOLN
500.0000 mg | Freq: Once | INTRAMUSCULAR | Status: AC
  Administered 2015-12-04: 500 mg via INTRAMUSCULAR
  Filled 2015-12-04: qty 10

## 2015-12-04 NOTE — Telephone Encounter (Signed)
appt made and avs printed °

## 2015-12-04 NOTE — Progress Notes (Signed)
Oral Chemotherapy Follow-Up Form  Original Start date of oral chemotherapy: 10/28/15  I s/w patient today after her injection of Faslodex to follow up regarding oral chemotherapy medication: Ibrance Current Dose: 100 mg daily (decreased dose) x 21 days "on" and 7 days "off", current cycle started 11/25/15 & ends 12/16/15.  Pt saw Dr. [name] today.  Labs stable & dose of Ibrance unchanged.  Pt reports 0 tablets/doses missed in the last month.   Pt reports the following side effects:  Ocular disturbance: Pt developed "film" over R eye while watching TV last night.  She rubbed her eye thinking she could clear the problem but it persisted.  She stated she does not have excessive tearing.  This is a new adverse effect for her and she did not inform Dr. [name] of this during visit today.  Pt plans to contact her eye doctor.   Incidence of blurred vision = 4-6%, dry eyes = 4%, excessive tearing = 6% (source: Micromedex). Constipation: ongoing.  Discussed a plan for tx today w/ Dr. [name].  This "comes & goes." Fatigue/exhaustion: worse w/ 1st cycle of Faslodex + Ibrance in combo.  She noted it was the worse on the Friday after receiving the Faslodex loading and then it was better by Sunday.  Subsequently her dose of Ibrance was decreased from 125 mg to 100 mg.  She will notify Dr. [name] if the exhaustion is the same this weekend.    Pt denied n/v.  She will start another cycle of Ibrance on 12/22/15 so we will follow up and call patient again on 12/28/15 when she is due back for labs.  Thank you, Kennith Center, Pharm.D., CPP 12/04/2015@1 :26 PM [facility]otherapy [facility]

## 2015-12-04 NOTE — Progress Notes (Signed)
ID: [name]   DOB: 30-Aug-1955  MR#: [id number]  KMM#:[id number]  Patient Care Team: Sonia Elbe, MD as PCP - General (Family Medicine) Sonia Cruel, MD as Consulting Physician (Oncology) Sonia Posey, MD as Consulting Physician (Obstetrics and Gynecology) Sonia Spates, MD as Consulting Physician (Gastroenterology) Sonia Ruff, MD as Consulting Physician (General Surgery) Sonia Rend, MD as Consulting Physician (Endocrinology)   CHIEF COMPLAINT: Metastatic breast cancer, estrogen receptor positive  CURRENT TREATMENT: fulvestrant, palbociclib.  BREAST CANCER HISTORY:  From the initial intake note:  Sonia Hill had screening mammography on 11/10/2009 showing very dense tissue with a possible distortion in the left breast.  Digital mammography on June 10th showed a 1.5 cm cluster of microcalcifications in the lower inner portion of the breast.  On physical exam, there was no palpable mass and no palpable axillary adenopathy.  There was some nipple inversion inferolaterally.  Ultrasound showed a 2.6 cm irregular mass-like area with dense posterior acoustic shadowing in the 3 o'clock position, 1 cm from the nipple, but normal appearing left axillary lymph nodes.    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  A second mass noted on ultrasound was also lobular, also low grade.  Both were ER and PR positive at well over 90%.  Both had low proliferation markers at 9%, and both were Her-2 negative by CISH with ratios of 1.6 and 1.24.  In short, both masses were nearly identical.    On June 16th, the patient had bilateral breast MRIs which showed in the left breast at 3 o'clock an irregular area of abnormal enhancement measuring up to 2.7 cm and in the left lower inner quadrant, a second post-biopsy area measuring 1.5 cm.  The difference  between these two areas was stated at the conference the morning of the visit to be approximately 5 cm.  Given this data the patient opted for Left mastectomy with sentinel lymph node sampling, performed July 2011 with results as detailed below.  Subsequent history is as detailed below.  INTERVAL HISTORY: Sonia Hill returns for followup of her estrogen receptor positive breast cancer, accompanied by her sister Sonia Hill. Sonia Hill was started on fulvestrant 10/23/2015. She is now receiving monthly treatments, with a dose due today. She tolerates this shots moderately well. She had some headaches after the first one and she was very tired after the second one.  However the fatigue may have been due to her having started palbociclib before the second fulvestrant shot. She was receiving 125 mg daily. Aside from the fatigue she tolerated this moderately well, but her counts did drop in off that we could not start her second cycle on time, so her dose was decreased to 100 mg daily, 21 days on/7 days off. She started the second cycle on 11/25/2015.  REVIEW OF SYSTEMS: Sonia Hill has been doubly stressed because a tree literally fell on her husband Sonia Hill (he was trying to take the tree down) causing severe trauma including multiple fractures. He was in the hospital several days and then went home requiring significant assistance. He is now doing better thankfully. Sonia Hill is getting phone calls from her specialty pharmacy and while these are managed to be informative they are her considerably. They also sent her information from the company and she had innumerable questions today regarding that. Aside from these issues, she complains of some blurred vision at times a month  palpitations, which come and go and are not a new problem for her constipation and occasionally bright red blood on the tissue, easy bruising, and low back pain. A detailed review of systems today was otherwise noncontributory  PAST MEDICAL HISTORY: Past Medical  History  Diagnosis Date  . SVD (spontaneous vaginal delivery)     x 1  . PONV (postoperative nausea and vomiting)   . Hypothyroidism   . Hyperlipidemia     diet controlled  . Depression     Hx - no current problem  . Chronic kidney disease     Only has right kidney  . GERD (gastroesophageal reflux disease)     diet controlled - no meds  . Arthritis     neck, upper back, shoulders - no meds - yoga  . lt breast ca dx'd 11/2009  . Osteoporosis, unspecified 12/06/2013  1. Osteopenia. 2. Congenital left renal atrophy.  3. History of recurrent UTIs. 4. History of bladder reconstruction for reflux more than 18 years ago. 5. History of depression and anxiety. 6. History of hypothyroidism.  7. History of "sling procedure".  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    FAMILY HISTORY The patient's father is alive in his early 81s.  He has Alzheimer's disease. The patient's mother was diagnosed with breast cancer at the age of 67.  She died at the age of 53.  The patient's father had one out of two sisters with breast cancer.  There are other breast cancers on the mother's side.  The patient has one brother and one younger sister, neither with cancer.    GYNECOLOGIC HISTORY:  (Reviewed 12/06/2013) She is GX P1.  That pregnancy was at age 66.  Menarche was at age 68.  She was perimenopausal at the time of diagnosis but is now convincingly postmenopausal  SOCIAL HISTORY:  (Reviewed 12/06/2013) She works as a Electrical engineer, part-time and on consultation.  Her husband, Sonia Hill, is a Architect at [employer].  Sister Sonia Hill works as a Pharmacist, hospital in [place].  The patient's son Sonia Hill is  in college.  The patient attends [employer].   ADVANCED DIRECTIVES: in place  HEALTH MAINTENANCE: (Updated 12/06/2013)  Social History  Substance Use Topics  . Smoking status: Never Smoker   . Smokeless tobacco: Never Used  .  Alcohol Use: No     Colonoscopy: 06/18/2015; [facility]  PAP: UTD/May 2015, Dr. Matthew Hill  Bone density: Sonia Hill 2014, osteoporosis  Lipid panel: UTD (Not on file)    Allergies  Allergen Reactions  . Adhesive [Tape] Rash    Ok to use paper tape and tegaderm over IV site  . Betadine [Povidone Iodine] Rash  . Mercury Rash    Reaction mercurachrome  . Sulfa Antibiotics Rash    Current Outpatient Prescriptions  Medication Sig Dispense Refill  . ascorbic acid (VITAMIN C) 1000 MG tablet Take 1,000 mg by mouth 3 (three) times daily.     . Ashwagandha 125 MG CAPS Take 125 mg by mouth daily.     . Cholecalciferol (VITAMIN D-3) 5000 UNITS TABS Take 5,000 Units by mouth daily.     Marland Kitchen FOLIC ACID PO Take 1,275 mcg by mouth daily.     Nyoka Cowden Tea, Camillia sinensis, (GREEN TEA EXTRACT PO) Take 1 tablet by mouth daily.    Marland Kitchen MAGNESIUM PO Take 325 mg by mouth at bedtime as needed (leg cramps). Magnesium calm - powder    .  Multiple Vitamin (MULTIVITAMIN WITH MINERALS) TABS tablet Take 1 tablet by mouth daily.    . Multiple Vitamins-Minerals (HAIR/SKIN/NAILS/BIOTIN) TABS Take 1 tablet by mouth daily.    . niacin 250 MG tablet Take 250 mg by mouth at bedtime.    . Omega-3 Fatty Acids (FISH OIL) 1200 MG CAPS Take 1,200 mg by mouth 2 (two) times daily.    Marland Kitchen OVER THE COUNTER MEDICATION Take 12.5 mg by mouth daily. Iodoral Iodine  supplement    . palbociclib (IBRANCE) 100 MG capsule Take whole with food for 21 days then off x 7 21 capsule 0  . Probiotic Product (PROBIOTIC DAILY PO) Take 1 tablet by mouth daily.     . Strontium Chloride CRYS Take 300 mg by mouth daily.    Marland Kitchen thyroid (NATURE-THROID) 65 MG tablet Take 65 mg by mouth daily.     No current facility-administered medications for this visit.    OBJECTIVE: Middle aged white woman In no acute distress Filed Vitals:   12/04/15 1031  BP: 131/78  Pulse: 72  Temp: 97.5 F (36.4 C)  Resp: 18     Body mass index is 20.53 kg/(m^2).    ECOG FS:  1 Filed Weights   12/04/15 1031  Weight: 108 lb 9.6 oz (49.261 kg)   Sclerae unicteric, EOMs intact Oropharynx clear, dentition in good repair No cervical or supraclavicular adenopathy Lungs no rales or rhonchi Heart regular rate and rhythm Abd soft, nontender, positive bowel sounds MSK no focal spinal tenderness, no upper extremity lymphedema Neuro: nonfocal, well oriented, appropriate affect Breasts: Deferred    LAB RESULTS:   Results for KEAMBER, MACFADDEN (MRN [id number]) as of 12/06/2015 08:58  Ref. Range 03/13/2015 15:06 05/08/2015 10:56 05/29/2015 09:22 08/05/2015 11:34 10/23/2015 10:50  CA 27.29 Latest Ref Range: <38 U/mL 49 (H) 115 (H) 149 (H) 151 (H) 181 (H)    Lab Results  Component Value Date   WBC 3.1* 12/04/2015   NEUTROABS 2.2 12/04/2015   HGB 13.5 12/04/2015   HCT 38.1 12/04/2015   MCV 90.9 12/04/2015   PLT 360 12/04/2015      Chemistry      Component Value Date/Time   NA 135* 10/23/2015 1050   NA 140 03/23/2012 1557   K 4.5 10/23/2015 1050   K 3.7 03/23/2012 1557   CL 98 12/03/2012 1419   CL 102 03/23/2012 1557   CO2 27 10/23/2015 1050   CO2 28 03/23/2012 1557   BUN 10.7 10/23/2015 1050   BUN 12 03/23/2012 1557   CREATININE 0.9 10/23/2015 1050   CREATININE 0.80 09/16/2015 1033      Component Value Date/Time   CALCIUM 10.2 10/23/2015 1050   CALCIUM 10.2 06/09/2014 1152   CALCIUM 10.1 08/01/2011 1415   ALKPHOS 93 10/23/2015 1050   ALKPHOS 93 03/23/2012 1557   AST 23 10/23/2015 1050   AST 33 03/23/2012 1557   ALT 20 10/23/2015 1050   ALT 33 03/23/2012 1557   BILITOT 0.59 10/23/2015 1050   BILITOT 0.5 03/23/2012 1557      Lab Results  Component Value Date   LABCA2 181* 10/23/2015    STUDIES: No results found.   ASSESSMENT: 60 y.o.  BRCA 1-2 negative [facility] woman  (1) status post left mastectomy and sentinel lymph node sampling in July 2011 for a T1 N1 M1, stage IV invasive lobular breast cancer, grade 1, strongly estrogen and  progesterone receptor-positive, HER2 negative with MIB-1 of 9% and no HER2 amplification,   (2)  with multiple sclerotic bone lesions at presentation seen only on CT scan (not on bone scan or PET scan), but  with biopsy-proven metastatic disease to bone; and an elevated CA 27.29 at presentation,   (3) Oncotype recurrence score of 4, predicts a good response to antiestrogens.  (4) Systemic treatment has consisted of  a) tamoxifen with evidence of response but poor tolerance  b) letrozole starting August 2012, discontinued October 2014 per patient   (5) single functioning kidney  (6) status post bilateral salpingo-oophorectomy 01/24/2013, with benign pathology  (7) osteoporosis; the patient refuses bisphosphonate therapy; started [facility] February 2014.    (a) bone density was obtained under the care of Dr. Matthew Hill at [facility], 06/25/2012, showing osteoporosis with a T score -2.6. This was repeated 12/20/2013, showing again osteoporosis with T-scores between -2.4 and -2.8.  (8) the patient refuses standard Mammography or tomography; undergoing thermography screening of the right breast.  (9) PET scan 06/05/2015 shows rectal thickening and a presacral mass; biopsy of this area 10/09/2015 confirms metastatic lobular breast cancer, again estrogen receptor positive, HER-2 nonamplified.  (10) started fulvestrant and palbociclib 125 mg/ day [21/7] May 2017  (a) fulvestrant dose decreased to 100 mg daily [21/7] with second cycle, started 11/25/2015   PLAN:  We spent over 50 minutes today going over Bliss's situation. She is receiving a great deal of information from her specialty pharmacy, and this is generally a good thing. However she is misinterpreting in some of the information. For example she reviewed the data on the fulvestrant/palbociclib as compared to the letrozole/palbociclib studies and concluded that the letrozole must be more effective, because there seemed  to be a longer period of remission with letrozole.  It took me approximately 30 minutes to explain that we cannot compare results across studies with different populations, and that the important point of both studies is that in both cases adding the palbociclib doubled the time to relapse.  I urged her to use some additional stool softeners or take MiraLAX daily because I think given the fact that she has cancer involving the rectum, constipation will be not only painful and bloody but potentially dangerous (it may increase the risk of sepsis).  Given her lowish counts on the original dose of palbociclib, leading to the drop in dose, I think it would be prudent to check her counts on a weekly basis and this is being operationalized. Once she is on a stable dose of palbociclib we will coordinate that with her fulvestrant treatments so she only has to come here once a month for treatment labs plus or minus visits.  At this point the plan is to continue the fulvestrant and palbociclib N1 she is on a stable dose, obtain restaging studies, which will serve as the new baseline. We are also going to be following her CA-27-29.  Incidentally she is on a changing dose of Armour Thyroid and I will add a TSH to her next set of labs.   Sonia Cruel, MD 12/04/2015

## 2015-12-04 NOTE — Patient Instructions (Signed)

## 2015-12-05 LAB — CANCER ANTIGEN 27.29: CAN 27.29: 162.3 U/mL — AB (ref 0.0–38.6)

## 2015-12-09 ENCOUNTER — Other Ambulatory Visit: Payer: Self-pay | Admitting: Oncology

## 2015-12-11 ENCOUNTER — Other Ambulatory Visit: Payer: 59

## 2015-12-14 ENCOUNTER — Other Ambulatory Visit (HOSPITAL_BASED_OUTPATIENT_CLINIC_OR_DEPARTMENT_OTHER): Payer: 59

## 2015-12-14 DIAGNOSIS — C50912 Malignant neoplasm of unspecified site of left female breast: Secondary | ICD-10-CM

## 2015-12-14 DIAGNOSIS — C7951 Secondary malignant neoplasm of bone: Secondary | ICD-10-CM | POA: Diagnosis not present

## 2015-12-14 DIAGNOSIS — C50312 Malignant neoplasm of lower-inner quadrant of left female breast: Secondary | ICD-10-CM | POA: Diagnosis not present

## 2015-12-14 DIAGNOSIS — M810 Age-related osteoporosis without current pathological fracture: Secondary | ICD-10-CM

## 2015-12-14 LAB — CBC WITH DIFFERENTIAL/PLATELET
BASO%: 1 % (ref 0.0–2.0)
BASOS ABS: 0 10*3/uL (ref 0.0–0.1)
EOS%: 1.3 % (ref 0.0–7.0)
Eosinophils Absolute: 0 10*3/uL (ref 0.0–0.5)
HCT: 35.4 % (ref 34.8–46.6)
HEMOGLOBIN: 12.5 g/dL (ref 11.6–15.9)
LYMPH#: 0.9 10*3/uL (ref 0.9–3.3)
LYMPH%: 29.6 % (ref 14.0–49.7)
MCH: 32.1 pg (ref 25.1–34.0)
MCHC: 35.3 g/dL (ref 31.5–36.0)
MCV: 91 fL (ref 79.5–101.0)
MONO#: 0.1 10*3/uL (ref 0.1–0.9)
MONO%: 4.2 % (ref 0.0–14.0)
NEUT#: 2 10*3/uL (ref 1.5–6.5)
NEUT%: 63.9 % (ref 38.4–76.8)
NRBC: 0 % (ref 0–0)
Platelets: 268 10*3/uL (ref 145–400)
RBC: 3.89 10*6/uL (ref 3.70–5.45)
RDW: 15 % — ABNORMAL HIGH (ref 11.2–14.5)
WBC: 3.1 10*3/uL — ABNORMAL LOW (ref 3.9–10.3)

## 2015-12-16 ENCOUNTER — Other Ambulatory Visit: Payer: Self-pay | Admitting: *Deleted

## 2015-12-18 ENCOUNTER — Other Ambulatory Visit: Payer: 59

## 2015-12-21 ENCOUNTER — Other Ambulatory Visit (HOSPITAL_BASED_OUTPATIENT_CLINIC_OR_DEPARTMENT_OTHER): Payer: 59

## 2015-12-21 ENCOUNTER — Encounter: Payer: Self-pay | Admitting: General Practice

## 2015-12-21 DIAGNOSIS — C50312 Malignant neoplasm of lower-inner quadrant of left female breast: Secondary | ICD-10-CM | POA: Diagnosis not present

## 2015-12-21 DIAGNOSIS — C7951 Secondary malignant neoplasm of bone: Secondary | ICD-10-CM | POA: Diagnosis not present

## 2015-12-21 DIAGNOSIS — Z79899 Other long term (current) drug therapy: Secondary | ICD-10-CM

## 2015-12-21 DIAGNOSIS — M810 Age-related osteoporosis without current pathological fracture: Secondary | ICD-10-CM

## 2015-12-21 DIAGNOSIS — C50912 Malignant neoplasm of unspecified site of left female breast: Secondary | ICD-10-CM

## 2015-12-21 LAB — CBC WITH DIFFERENTIAL/PLATELET
BASO%: 1.3 % (ref 0.0–2.0)
Basophils Absolute: 0 10*3/uL (ref 0.0–0.1)
EOS%: 1.8 % (ref 0.0–7.0)
Eosinophils Absolute: 0.1 10*3/uL (ref 0.0–0.5)
HCT: 37 % (ref 34.8–46.6)
HGB: 12.9 g/dL (ref 11.6–15.9)
LYMPH%: 30 % (ref 14.0–49.7)
MCH: 32.8 pg (ref 25.1–34.0)
MCHC: 34.8 g/dL (ref 31.5–36.0)
MCV: 94.2 fL (ref 79.5–101.0)
MONO#: 0.2 10*3/uL (ref 0.1–0.9)
MONO%: 7.2 % (ref 0.0–14.0)
NEUT%: 59.7 % (ref 38.4–76.8)
NEUTROS ABS: 1.9 10*3/uL (ref 1.5–6.5)
Platelets: 217 10*3/uL (ref 145–400)
RBC: 3.92 10*6/uL (ref 3.70–5.45)
RDW: 17.1 % — AB (ref 11.2–14.5)
WBC: 3.2 10*3/uL — AB (ref 3.9–10.3)
lymph#: 1 10*3/uL (ref 0.9–3.3)

## 2015-12-21 LAB — TSH: TSH: 0.371 m[IU]/L (ref 0.308–3.960)

## 2015-12-21 NOTE — Progress Notes (Signed)
Spiritual Care Note  Sonia Hill for 45 min at this f/u emotional support appt.  She was much relieved, having had a little time to adjust to her own illness/tx while supporting her husband through recovery from his shoulder injury.  Provided empathic listening and pastoral reflection as she processed their struggles, developments, and recent beach vacation (source of rest and renewal).  She is practicing gratitude deliberately and joyfully as a coping tool.  Helped her think through additional support resources for her spouse.  Sonia Hill verbalized appreciation for support, normalization of feelings, and brainstorming.  Pt reports no other needs at this time and plans to reach out as needed, but please also page if circumstances change.  Thank you.  [place], [place], [facility] Pager [phone] Voicemail [phone]

## 2015-12-25 ENCOUNTER — Other Ambulatory Visit: Payer: 59

## 2015-12-28 ENCOUNTER — Telehealth: Payer: Self-pay | Admitting: *Deleted

## 2015-12-28 ENCOUNTER — Other Ambulatory Visit (HOSPITAL_BASED_OUTPATIENT_CLINIC_OR_DEPARTMENT_OTHER): Payer: 59

## 2015-12-28 DIAGNOSIS — C50312 Malignant neoplasm of lower-inner quadrant of left female breast: Secondary | ICD-10-CM | POA: Diagnosis not present

## 2015-12-28 DIAGNOSIS — M810 Age-related osteoporosis without current pathological fracture: Secondary | ICD-10-CM

## 2015-12-28 DIAGNOSIS — C7951 Secondary malignant neoplasm of bone: Secondary | ICD-10-CM | POA: Diagnosis not present

## 2015-12-28 DIAGNOSIS — C50912 Malignant neoplasm of unspecified site of left female breast: Secondary | ICD-10-CM

## 2015-12-28 LAB — CBC WITH DIFFERENTIAL/PLATELET
BASO%: 1.2 % (ref 0.0–2.0)
BASOS ABS: 0 10*3/uL (ref 0.0–0.1)
EOS%: 2.1 % (ref 0.0–7.0)
Eosinophils Absolute: 0.1 10*3/uL (ref 0.0–0.5)
HCT: 39.5 % (ref 34.8–46.6)
HGB: 13.6 g/dL (ref 11.6–15.9)
LYMPH%: 21.9 % (ref 14.0–49.7)
MCH: 32.9 pg (ref 25.1–34.0)
MCHC: 34.4 g/dL (ref 31.5–36.0)
MCV: 95.6 fL (ref 79.5–101.0)
MONO#: 0.2 10*3/uL (ref 0.1–0.9)
MONO%: 4.6 % (ref 0.0–14.0)
NEUT#: 2.3 10*3/uL (ref 1.5–6.5)
NEUT%: 70.2 % (ref 38.4–76.8)
Platelets: 285 10*3/uL (ref 145–400)
RBC: 4.14 10*6/uL (ref 3.70–5.45)
RDW: 17.5 % — ABNORMAL HIGH (ref 11.2–14.5)
WBC: 3.3 10*3/uL — ABNORMAL LOW (ref 3.9–10.3)
lymph#: 0.7 10*3/uL — ABNORMAL LOW (ref 0.9–3.3)

## 2015-12-28 NOTE — Telephone Encounter (Signed)
"  I have questions about my lab results for today." Advised WBC and Neutrophils have improved.  Inquiring about persistent increase in RDW.  Advised this is the size of her red blood cells.

## 2016-01-01 ENCOUNTER — Encounter: Payer: Self-pay | Admitting: Oncology

## 2016-01-01 ENCOUNTER — Other Ambulatory Visit: Payer: Self-pay | Admitting: *Deleted

## 2016-01-01 ENCOUNTER — Ambulatory Visit (HOSPITAL_BASED_OUTPATIENT_CLINIC_OR_DEPARTMENT_OTHER): Payer: 59 | Admitting: Oncology

## 2016-01-01 ENCOUNTER — Other Ambulatory Visit: Payer: 59

## 2016-01-01 ENCOUNTER — Ambulatory Visit (HOSPITAL_BASED_OUTPATIENT_CLINIC_OR_DEPARTMENT_OTHER): Payer: 59

## 2016-01-01 VITALS — BP 119/63 | HR 67 | Temp 98.2°F | Resp 18 | Ht 61.0 in | Wt 108.7 lb

## 2016-01-01 DIAGNOSIS — C50312 Malignant neoplasm of lower-inner quadrant of left female breast: Secondary | ICD-10-CM

## 2016-01-01 DIAGNOSIS — M810 Age-related osteoporosis without current pathological fracture: Secondary | ICD-10-CM | POA: Diagnosis not present

## 2016-01-01 DIAGNOSIS — M545 Low back pain: Secondary | ICD-10-CM

## 2016-01-01 DIAGNOSIS — Z5111 Encounter for antineoplastic chemotherapy: Secondary | ICD-10-CM | POA: Diagnosis not present

## 2016-01-01 DIAGNOSIS — C7951 Secondary malignant neoplasm of bone: Secondary | ICD-10-CM

## 2016-01-01 DIAGNOSIS — C50912 Malignant neoplasm of unspecified site of left female breast: Secondary | ICD-10-CM

## 2016-01-01 MED ORDER — FULVESTRANT 250 MG/5ML IM SOLN
500.0000 mg | Freq: Once | INTRAMUSCULAR | Status: AC
  Administered 2016-01-01: 500 mg via INTRAMUSCULAR
  Filled 2016-01-01: qty 10

## 2016-01-01 NOTE — Progress Notes (Signed)
ID: [name]   DOB: 04/20/1956  MR#: [id number]  FIE#:[id number]  Patient Care Team: [name], MD as PCP - General (Family Medicine) Chauncey Cruel, MD as Consulting Physician (Oncology) Molli Posey, MD as Consulting Physician (Obstetrics and Gynecology) Laurence Spates, MD as Consulting Physician (Gastroenterology) Leighton Ruff, MD as Consulting Physician (General Surgery) Delrae Rend, MD as Consulting Physician (Endocrinology)   CHIEF COMPLAINT: Metastatic breast cancer, estrogen receptor positive  CURRENT TREATMENT: fulvestrant, palbociclib.  BREAST CANCER HISTORY:  From the initial intake note:  Sonia Hill had screening mammography on 11/10/2009 showing very dense tissue with a possible distortion in the left breast.  Digital mammography on June 10th showed a 1.5 cm cluster of microcalcifications in the lower inner portion of the breast.  On physical exam, there was no palpable mass and no palpable axillary adenopathy.  There was some nipple inversion inferolaterally.  Ultrasound showed a 2.6 cm irregular mass-like area with dense posterior acoustic shadowing in the 3 o'clock position, 1 cm from the nipple, but normal appearing left axillary lymph nodes.    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  A second mass noted on ultrasound was also lobular, also low grade.  Both were ER and PR positive at well over 90%.  Both had low proliferation markers at 9%, and both were Her-2 negative by CISH with ratios of 1.6 and 1.24.  In short, both masses were nearly identical.    On June 16th, the patient had bilateral breast MRIs which showed in the left breast at 3 o'clock an irregular area of abnormal enhancement measuring up to 2.7 cm and in the left lower inner quadrant, a second post-biopsy area measuring 1.5 cm.  The difference  between these two areas was stated at the conference the morning of the visit to be approximately 5 cm.  Given this data the patient opted for Left mastectomy with sentinel lymph node sampling, performed July 2011 with results as detailed below.  Subsequent history is as detailed below.  INTERVAL HISTORY: Sonia Hill returns for followup of her metastatic breast cancer, accompanied by her husband Sonia Hill.Sonia Hill continues on fulvestrant, with her third monthly dose due today. She is also on palbociclib, at a dose of 100 mg daily for 21 days then off 7 days. She started the current cycle on 12/23/2015   REVIEW OF SYSTEMS: Sonia Hill tolerates the fulvestrant with no side effects that she is aware of. She is concerned that aromatase inhibitors like letrozole worsen osteoporosis, which she already has, but fulvestrant does not do that. Different mechanism. She tolerates the palbociclib remarkably well. She has occasionally felt a little bit tired, and not constantly so. She had an episode walking uphill with Tylenol when she felt what she thinks may have been reflux but also made her feel short of breath. She cut the walk short and that problem has not recurred. She is constipated about once a month, but otherwise has regular bowel movements which are about the thickness of her thumb, not bloody or painful. She has no urinary issues. She does bruise easily. She has occasional problems with back pain. A detailed review of systems today was otherwise stable.  PAST MEDICAL HISTORY: Past Medical History  Diagnosis Date  . SVD (spontaneous vaginal delivery)     x 1  . PONV (postoperative nausea and vomiting)   . Hypothyroidism   .  Hyperlipidemia     diet controlled  . Depression     Hx - no current problem  . Chronic kidney disease     Only has right kidney  . GERD (gastroesophageal reflux disease)     diet controlled - no meds  . Arthritis     neck, upper back, shoulders - no meds - yoga  . lt breast ca dx'd  11/2009  . Osteoporosis, unspecified 12/06/2013  1. Osteopenia. 2. Congenital left renal atrophy.  3. History of recurrent UTIs. 4. History of bladder reconstruction for reflux more than 18 years ago. 5. History of depression and anxiety. 6. History of hypothyroidism.  7. History of "sling procedure".  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    FAMILY HISTORY The patient's father is alive in his early 58s.  He has Alzheimer's disease. The patient's mother was diagnosed with breast cancer at the age of 19.  She died at the age of 98.  The patient's father had one out of two sisters with breast cancer.  There are other breast cancers on the mother's side.  The patient has one brother and one younger sister, neither with cancer.    GYNECOLOGIC HISTORY:  (Reviewed 12/06/2013) She is GX P1.  That pregnancy was at age 40.  Menarche was at age 71.  She was perimenopausal at the time of diagnosis but is now convincingly postmenopausal  SOCIAL HISTORY:  (Reviewed 12/06/2013) She works as a Electrical engineer, part-time and on consultation.  Her husband, Sonia Hill, is a Architect at [employer].  Sister Sonia Hill works as a Pharmacist, hospital in [place].  The patient's son Sonia Hill is  in college.  The patient attends [employer].   ADVANCED DIRECTIVES: in place  HEALTH MAINTENANCE: (Updated 12/06/2013)  Social History  Substance Use Topics  . Smoking status: Never Smoker   . Smokeless tobacco: Never Used  . Alcohol Use: No     Colonoscopy: 06/18/2015; [facility]  PAP: UTD/May 2015, Dr. Matthew Saras  Bone density: Sonia Hill 2014, osteoporosis  Lipid panel: UTD (Not on file)    Allergies  Allergen Reactions  . Adhesive [Tape] Rash    Ok to use paper tape and tegaderm over IV site  . Betadine [Povidone Iodine] Rash  . Mercury Rash    Reaction mercurachrome  . Sulfa Antibiotics Rash    Current Outpatient Prescriptions   Medication Sig Dispense Refill  . ascorbic acid (VITAMIN C) 1000 MG tablet Take 1,000 mg by mouth 3 (three) times daily.     . Ashwagandha 125 MG CAPS Take 125 mg by mouth daily.     . Cholecalciferol (VITAMIN D-3) 5000 UNITS TABS Take 5,000 Units by mouth daily.     Marland Kitchen FOLIC ACID PO Take 5,364 mcg by mouth daily.     Leslee Home 100 MG capsule TAKE ONE CAPSULE (100 MG) BY MOUTH DAILY WITH FOOD FOR 21 DAYS OF A 28DAY CYCLE.  SWALLOW WHOLE.  DO NOT TAKE IF CAPSULES ARE BROKEN OR CRA 21 capsule 6  . MAGNESIUM PO Take 325 mg by mouth at bedtime as needed (leg cramps). Magnesium calm - powder    . Multiple Vitamin (MULTIVITAMIN WITH MINERALS) TABS tablet Take 1 tablet by mouth daily.    . Multiple Vitamins-Minerals (HAIR/SKIN/NAILS/BIOTIN) TABS Take 1 tablet by mouth daily.    . niacin 250 MG tablet Take 250 mg by mouth at bedtime.    . Omega-3 Fatty  Acids (FISH OIL) 1200 MG CAPS Take 1,200 mg by mouth 2 (two) times daily.    Marland Kitchen OVER THE COUNTER MEDICATION Take 12.5 mg by mouth daily. Iodoral Iodine  supplement    . Probiotic Product (PROBIOTIC DAILY PO) Take 1 tablet by mouth daily.     Marland Kitchen thyroid (NATURE-THROID) 65 MG tablet Take 81.25 mg by mouth daily.     No current facility-administered medications for this visit.   Facility-Administered Medications Ordered in Other Visits  Medication Dose Route Frequency Provider Last Rate Last Dose  . fulvestrant (FASLODEX) injection 500 mg  500 mg Intramuscular Once Chauncey Cruel, MD        OBJECTIVE: Middle aged white woman who appears stated age 60 Vitals:   01/01/16 1129  BP: 119/63  Pulse: 67  Temp: 98.2 F (36.8 C)  Resp: 18     Body mass index is 20.55 kg/(m^2).    ECOG FS: 1 Filed Weights   01/01/16 1129  Weight: 108 lb 11.2 oz (49.306 kg)   Sclerae unicteric, pupils round and equal Oropharynx clear and moist-- no thrush or other lesions No cervical or supraclavicular adenopathy Lungs no rales or rhonchi Heart regular rate and  rhythm Abd soft, nontender, positive bowel sounds MSK no focal spinal tenderness, no upper extremity lymphedema Neuro: nonfocal, well oriented, appropriate affect Breasts: Deferred  LAB RESULTS:  Results for FATIN, BACHICHA (MRN [id number]) as of 01/01/2016 12:23  Ref. Range 05/08/2015 10:56 05/29/2015 09:22 08/05/2015 11:34 10/23/2015 10:50 12/04/2015 10:16  CA 27.29 Latest Ref Range: 0.0-38.6 U/mL   154.2 (H) 163.4 (H) 162.3 (H)  Results for JONITA, HIROTA (MRN [id number]) as of 01/01/2016 12:23  Ref. Range 05/17/2011 16:06 12/21/2015 10:23  TSH Latest Ref Range: 0.308-3.960 m(IU)/L 0.566 0.371    Lab Results  Component Value Date   WBC 3.3* 12/28/2015   NEUTROABS 2.3 12/28/2015   HGB 13.6 12/28/2015   HCT 39.5 12/28/2015   MCV 95.6 12/28/2015   PLT 285 12/28/2015      Chemistry      Component Value Date/Time   NA 139 12/04/2015 1016   NA 140 03/23/2012 1557   K 4.4 12/04/2015 1016   K 3.7 03/23/2012 1557   CL 98 12/03/2012 1419   CL 102 03/23/2012 1557   CO2 28 12/04/2015 1016   CO2 28 03/23/2012 1557   BUN 14.8 12/04/2015 1016   BUN 12 03/23/2012 1557   CREATININE 1.1 12/04/2015 1016   CREATININE 0.80 09/16/2015 1033      Component Value Date/Time   CALCIUM 10.2 12/04/2015 1016   CALCIUM 10.2 06/09/2014 1152   CALCIUM 10.1 08/01/2011 1415   ALKPHOS 82 12/04/2015 1016   ALKPHOS 93 03/23/2012 1557   AST 24 12/04/2015 1016   AST 33 03/23/2012 1557   ALT 23 12/04/2015 1016   ALT 33 03/23/2012 1557   BILITOT 0.64 12/04/2015 1016   BILITOT 0.5 03/23/2012 1557      Lab Results  Component Value Date   LABCA2 181* 10/23/2015    STUDIES: No results found.   ASSESSMENT: 60 y.o.  BRCA 1-2 negative [facility] woman  (1) status post left mastectomy and sentinel lymph node sampling in July 2011 for a T1 N1 M1, stage IV invasive lobular breast cancer, grade 1, strongly estrogen and progesterone receptor-positive, HER2 negative with MIB-1 of 9% and no HER2 amplification,    (2) with multiple sclerotic bone lesions at presentation seen only on CT scan (not on bone scan  or PET scan), but  with biopsy-proven metastatic disease to bone; and an elevated CA 27.29 at presentation,   (3) Oncotype recurrence score of 4, predicts a good response to antiestrogens.  (4) Systemic treatment has consisted of  a) tamoxifen with evidence of response but poor tolerance  b) letrozole starting August 2012, discontinued October 2014 per patient   (5) single functioning kidney  (6) status post bilateral salpingo-oophorectomy 01/24/2013, with benign pathology  (7) osteoporosis; the patient refuses bisphosphonate therapy; started [facility] February 2014.    (a) bone density was obtained under the care of Dr. Matthew Saras at [facility], 06/25/2012, showing osteoporosis with a T score -2.6. This was repeated 12/20/2013, showing again osteoporosis with T-scores between -2.4 and -2.8.  (8) the patient refuses standard Mammography or tomography; undergoing thermography screening of the right breast.  (9) PET scan 06/05/2015 shows rectal thickening and a presacral mass; biopsy of this area 10/09/2015 confirms metastatic lobular breast cancer, again estrogen receptor positive, HER-2 nonamplified.  (10) started fulvestrant and palbociclib 125 mg/ day [21/7] May 2017  (a) palbociclib dose decreased to 100 mg daily [21/7] with second cycle, started 11/25/2015   PLAN:  I spent nearly 45 minutes today with Sonia Hill and her husband reviewing her questions and multiple concerns regarding her situation.  Lawan is tolerating the elbow sick leave and fulvestrant generally quite well. Clinically there has been no evidence of disease progression. There may be some early indications of response. We are going to continue the fulvestrant every 4 weeks, except that she is going to be in [place] between August 17 and August 21, so we are moving the next dose up to August 16. She will  then go back to a Friday dose, which will be September 15.  Her counts are holding up well with the current dose of palbociclib, so we are going to start checking her blood counts every 2 weeks instead of weekly. Eventually we should be able to do it once a month.  I am setting her up for a repeat MRI of the pelvis July 12 before she sees me on July 15. We will also have to additional readings of the CA-27-29 to help [place] decide whether she is making progress with the current treatment.  She does have to pay approximately $750 for each fulvestrant dose. If we switched to letrozole would be much cheaper for her. I think this is a change we may want to move but I would like to establish first whether she is responding to the current treatment so we will discuss switching to letrozole on 02/26/2016.  She tells me that Dr. Julien Nordmann has obtained lab work confirming iron deficiency. A very good iron supplement was suggested for her except that it does have high doses of B12 and we have data that high doses of vitamins can rescue cancer cells. I would prefer she not take anything much above the 100% daily value (vitamin D is an exception). She will discuss this with Dr. Julien Nordmann. We do have intravenous iron that we can give and if she wanted to do that she would bring me Dr. Geraldo Pitter iron studies to confirm the arm deficiency. Normally we used Feraheme for 2 doses given a week apart.  She had a bone density at Dr. Matthew Saras. I have not received that report but we will request it.  Mckenze will see me again on July 15. She knows to call for any problems that may develop before that visit.  Chauncey Cruel, MD 01/01/2016

## 2016-01-01 NOTE — Progress Notes (Signed)
Pt came in inquiring about copay assistance for Faslodex.  I enrolled pt in the [employer] Savings program.  Submitted claims today.  Waiting for approval.

## 2016-01-04 ENCOUNTER — Other Ambulatory Visit: Payer: 59

## 2016-01-04 ENCOUNTER — Other Ambulatory Visit (HOSPITAL_BASED_OUTPATIENT_CLINIC_OR_DEPARTMENT_OTHER): Payer: 59

## 2016-01-04 DIAGNOSIS — C50312 Malignant neoplasm of lower-inner quadrant of left female breast: Secondary | ICD-10-CM

## 2016-01-04 DIAGNOSIS — M810 Age-related osteoporosis without current pathological fracture: Secondary | ICD-10-CM

## 2016-01-04 DIAGNOSIS — C50912 Malignant neoplasm of unspecified site of left female breast: Secondary | ICD-10-CM

## 2016-01-04 DIAGNOSIS — C7951 Secondary malignant neoplasm of bone: Secondary | ICD-10-CM

## 2016-01-04 LAB — CBC WITH DIFFERENTIAL/PLATELET
BASO%: 0.6 % (ref 0.0–2.0)
Basophils Absolute: 0 10*3/uL (ref 0.0–0.1)
EOS%: 2 % (ref 0.0–7.0)
Eosinophils Absolute: 0.1 10*3/uL (ref 0.0–0.5)
HEMATOCRIT: 35.5 % (ref 34.8–46.6)
HEMOGLOBIN: 12.5 g/dL (ref 11.6–15.9)
LYMPH#: 0.7 10*3/uL — AB (ref 0.9–3.3)
LYMPH%: 20.3 % (ref 14.0–49.7)
MCH: 32.7 pg (ref 25.1–34.0)
MCHC: 35.2 g/dL (ref 31.5–36.0)
MCV: 92.9 fL (ref 79.5–101.0)
MONO#: 0.1 10*3/uL (ref 0.1–0.9)
MONO%: 2.9 % (ref 0.0–14.0)
NEUT%: 74.2 % (ref 38.4–76.8)
NEUTROS ABS: 2.6 10*3/uL (ref 1.5–6.5)
Platelets: 323 10*3/uL (ref 145–400)
RBC: 3.82 10*6/uL (ref 3.70–5.45)
RDW: 14.9 % — AB (ref 11.2–14.5)
WBC: 3.5 10*3/uL — AB (ref 3.9–10.3)

## 2016-01-04 LAB — COMPREHENSIVE METABOLIC PANEL
ALBUMIN: 3.8 g/dL (ref 3.5–5.0)
ALT: 26 U/L (ref 0–55)
AST: 33 U/L (ref 5–34)
Alkaline Phosphatase: 79 U/L (ref 40–150)
Anion Gap: 8 mEq/L (ref 3–11)
BILIRUBIN TOTAL: 0.56 mg/dL (ref 0.20–1.20)
BUN: 13.6 mg/dL (ref 7.0–26.0)
CALCIUM: 9.9 mg/dL (ref 8.4–10.4)
CHLORIDE: 103 meq/L (ref 98–109)
CO2: 27 mEq/L (ref 22–29)
CREATININE: 1.1 mg/dL (ref 0.6–1.1)
EGFR: 53 mL/min/{1.73_m2} — ABNORMAL LOW (ref >90)
Glucose: 96 mg/dl (ref 70–140)
Potassium: 4.4 mEq/L (ref 3.5–5.1)
Sodium: 138 mEq/L (ref 136–145)
TOTAL PROTEIN: 7 g/dL (ref 6.4–8.3)

## 2016-01-05 ENCOUNTER — Encounter: Payer: Self-pay | Admitting: Oncology

## 2016-01-05 LAB — CEA: CEA1: 12.3 ng/mL — AB (ref 0.0–4.7)

## 2016-01-05 LAB — CANCER ANTIGEN 27.29: CAN 27.29: 116.1 U/mL — AB (ref 0.0–38.6)

## 2016-01-08 ENCOUNTER — Other Ambulatory Visit: Payer: 59

## 2016-01-14 ENCOUNTER — Other Ambulatory Visit (HOSPITAL_BASED_OUTPATIENT_CLINIC_OR_DEPARTMENT_OTHER): Payer: 59

## 2016-01-14 DIAGNOSIS — C50312 Malignant neoplasm of lower-inner quadrant of left female breast: Secondary | ICD-10-CM

## 2016-01-14 DIAGNOSIS — C7951 Secondary malignant neoplasm of bone: Secondary | ICD-10-CM

## 2016-01-14 DIAGNOSIS — M810 Age-related osteoporosis without current pathological fracture: Secondary | ICD-10-CM

## 2016-01-14 DIAGNOSIS — C50912 Malignant neoplasm of unspecified site of left female breast: Secondary | ICD-10-CM

## 2016-01-14 LAB — CBC WITH DIFFERENTIAL/PLATELET
BASO%: 0.8 % (ref 0.0–2.0)
Basophils Absolute: 0 10e3/uL (ref 0.0–0.1)
EOS%: 1 % (ref 0.0–7.0)
Eosinophils Absolute: 0 10e3/uL (ref 0.0–0.5)
HCT: 33.9 % — ABNORMAL LOW (ref 34.8–46.6)
HGB: 12.1 g/dL (ref 11.6–15.9)
LYMPH%: 26.8 % (ref 14.0–49.7)
MCH: 33.5 pg (ref 25.1–34.0)
MCHC: 35.7 g/dL (ref 31.5–36.0)
MCV: 93.9 fL (ref 79.5–101.0)
MONO#: 0.2 10e3/uL (ref 0.1–0.9)
MONO%: 4.4 % (ref 0.0–14.0)
NEUT#: 2.6 10e3/uL (ref 1.5–6.5)
NEUT%: 67 % (ref 38.4–76.8)
Platelets: 187 10e3/uL (ref 145–400)
RBC: 3.61 10e6/uL — ABNORMAL LOW (ref 3.70–5.45)
RDW: 14.2 % (ref 11.2–14.5)
WBC: 3.9 10e3/uL (ref 3.9–10.3)
lymph#: 1 10e3/uL (ref 0.9–3.3)

## 2016-01-15 ENCOUNTER — Other Ambulatory Visit: Payer: 59

## 2016-01-27 ENCOUNTER — Ambulatory Visit (HOSPITAL_BASED_OUTPATIENT_CLINIC_OR_DEPARTMENT_OTHER): Payer: 59

## 2016-01-27 ENCOUNTER — Other Ambulatory Visit (HOSPITAL_BASED_OUTPATIENT_CLINIC_OR_DEPARTMENT_OTHER): Payer: 59

## 2016-01-27 VITALS — BP 134/66 | HR 69 | Temp 97.9°F | Resp 16

## 2016-01-27 DIAGNOSIS — C50912 Malignant neoplasm of unspecified site of left female breast: Secondary | ICD-10-CM

## 2016-01-27 DIAGNOSIS — Z5111 Encounter for antineoplastic chemotherapy: Secondary | ICD-10-CM | POA: Diagnosis not present

## 2016-01-27 DIAGNOSIS — C7951 Secondary malignant neoplasm of bone: Secondary | ICD-10-CM | POA: Diagnosis not present

## 2016-01-27 DIAGNOSIS — M810 Age-related osteoporosis without current pathological fracture: Secondary | ICD-10-CM

## 2016-01-27 DIAGNOSIS — Z1379 Encounter for other screening for genetic and chromosomal anomalies: Secondary | ICD-10-CM

## 2016-01-27 DIAGNOSIS — C50312 Malignant neoplasm of lower-inner quadrant of left female breast: Secondary | ICD-10-CM

## 2016-01-27 LAB — CBC WITH DIFFERENTIAL/PLATELET
BASO%: 0.6 % (ref 0.0–2.0)
BASOS ABS: 0 10*3/uL (ref 0.0–0.1)
EOS%: 1.2 % (ref 0.0–7.0)
Eosinophils Absolute: 0.1 10*3/uL (ref 0.0–0.5)
HCT: 36 % (ref 34.8–46.6)
HGB: 12.9 g/dL (ref 11.6–15.9)
LYMPH%: 15 % (ref 14.0–49.7)
MCH: 34.2 pg — AB (ref 25.1–34.0)
MCHC: 35.8 g/dL (ref 31.5–36.0)
MCV: 95.5 fL (ref 79.5–101.0)
MONO#: 0.2 10*3/uL (ref 0.1–0.9)
MONO%: 4.3 % (ref 0.0–14.0)
NEUT#: 4.1 10*3/uL (ref 1.5–6.5)
NEUT%: 78.9 % — AB (ref 38.4–76.8)
Platelets: 288 10*3/uL (ref 145–400)
RBC: 3.77 10*6/uL (ref 3.70–5.45)
RDW: 13.7 % (ref 11.2–14.5)
WBC: 5.1 10*3/uL (ref 3.9–10.3)
lymph#: 0.8 10*3/uL — ABNORMAL LOW (ref 0.9–3.3)

## 2016-01-27 LAB — COMPREHENSIVE METABOLIC PANEL
ALT: 30 U/L (ref 0–55)
AST: 24 U/L (ref 5–34)
Albumin: 4 g/dL (ref 3.5–5.0)
Alkaline Phosphatase: 105 U/L (ref 40–150)
Anion Gap: 8 mEq/L (ref 3–11)
BILIRUBIN TOTAL: 0.46 mg/dL (ref 0.20–1.20)
BUN: 20.2 mg/dL (ref 7.0–26.0)
CHLORIDE: 105 meq/L (ref 98–109)
CO2: 26 mEq/L (ref 22–29)
CREATININE: 1.2 mg/dL — AB (ref 0.6–1.1)
Calcium: 10.4 mg/dL (ref 8.4–10.4)
EGFR: 49 mL/min/{1.73_m2} — ABNORMAL LOW (ref >90)
GLUCOSE: 68 mg/dL — AB (ref 70–140)
POTASSIUM: 4.6 meq/L (ref 3.5–5.1)
SODIUM: 139 meq/L (ref 136–145)
Total Protein: 7.6 g/dL (ref 6.4–8.3)

## 2016-01-27 LAB — CEA (IN HOUSE-CHCC): CEA (CHCC-In House): 17.09 ng/mL — ABNORMAL HIGH (ref 0.00–5.00)

## 2016-01-27 MED ORDER — FULVESTRANT 250 MG/5ML IM SOLN
500.0000 mg | Freq: Once | INTRAMUSCULAR | Status: AC
  Administered 2016-01-27: 500 mg via INTRAMUSCULAR
  Filled 2016-01-27: qty 10

## 2016-01-27 NOTE — Patient Instructions (Signed)

## 2016-01-28 LAB — CEA: CEA1: 13.7 ng/mL — AB (ref 0.0–4.7)

## 2016-01-28 LAB — CANCER ANTIGEN 27.29: CAN 27.29: 131 U/mL — AB (ref 0.0–38.6)

## 2016-02-03 ENCOUNTER — Telehealth: Payer: Self-pay | Admitting: Pharmacist

## 2016-02-03 ENCOUNTER — Telehealth: Payer: Self-pay | Admitting: *Deleted

## 2016-02-03 ENCOUNTER — Other Ambulatory Visit: Payer: Self-pay | Admitting: *Deleted

## 2016-02-03 NOTE — Telephone Encounter (Signed)
This RN spoke with pt per her call stating she has been away on vacation and has now returned.  " I thought I would have a message or something about the appointment for the scan Dr Magrinat's wants me to have before I see him in September "  Noted order for MRI of pelvis ordered in July with expected date of need for Sept.  inbox message sent to [employer].  Informed pt of above.

## 2016-02-03 NOTE — Telephone Encounter (Signed)
Oral Chemotherapy Pharmacist Encounter   Recieved call from patient in the oral chemo clinic for advice for medication Ibrance. Pt reports going out of town last week and missed 1 dose of her [name]. She is inquiring how she should make up for having an extra pill.  Pt is currently on Ibrance 100mg  daily for 3 weeks on and 1 week off. Current planned to end 8/29 and next cycle to start 9/5.  I reviewed patient's labs and counseled patient to talk "extra pill" on 8/30. She should still start next cycle on 9/5. We will see patient for a lab check on 9/1, then again 9/15 with an office visit with Dr. [name]. Pt's neutrophils have remained above 1.5 since reducing Ibrance dose to 100mg  daily.  Pt stated she is still experiencing fatigue, some days are better than others, unable to correlate to specific activities, sleep length, or diet. Pt stated that her fatigue is manageable at this time. Pt is still experiencing some ocular disturbances, but has not been bothersome enough to set an appointment with her eye doctor. She will continue to be mindful of vision changes and will follow-up with eye doctor if she feels this is necessary. Pt stated that her bruising is worse, pltc reviewed and well within normal limits. Pt denies any active bleeding at this time. Pt is experiencing ongoing constipation, somewhat relieved with suppositories and every other day Miralax. Pt also takes stool softeners BID. This is slightly worse this week due to traveling out of town and feeling dehydrated. She will increase her Miralax to daily until she feels she is back on a normal schedule. Pt was counseled to increase water intake during this time until she is able to decrease Miralax frequency again.  Appointments on 9/1 and 9/15 confirmed. Oral Chemo Clinic will continue to follow.  All questions answered, pt knows to call the clinic with any questions.  Pt understands and is in agreement with this plan.   Thank  you,  Johny Drilling, PharmD, BCPS Oral Chemotherapy Clinic

## 2016-02-12 ENCOUNTER — Other Ambulatory Visit (HOSPITAL_BASED_OUTPATIENT_CLINIC_OR_DEPARTMENT_OTHER): Payer: 59

## 2016-02-12 ENCOUNTER — Other Ambulatory Visit: Payer: Self-pay | Admitting: *Deleted

## 2016-02-12 DIAGNOSIS — C50912 Malignant neoplasm of unspecified site of left female breast: Secondary | ICD-10-CM

## 2016-02-12 DIAGNOSIS — C50312 Malignant neoplasm of lower-inner quadrant of left female breast: Secondary | ICD-10-CM | POA: Diagnosis not present

## 2016-02-12 DIAGNOSIS — C7951 Secondary malignant neoplasm of bone: Secondary | ICD-10-CM

## 2016-02-12 DIAGNOSIS — M810 Age-related osteoporosis without current pathological fracture: Secondary | ICD-10-CM

## 2016-02-12 LAB — CBC WITH DIFFERENTIAL/PLATELET
BASO%: 0.4 % (ref 0.0–2.0)
Basophils Absolute: 0 10*3/uL (ref 0.0–0.1)
EOS%: 1.3 % (ref 0.0–7.0)
Eosinophils Absolute: 0.1 10*3/uL (ref 0.0–0.5)
HCT: 34.5 % — ABNORMAL LOW (ref 34.8–46.6)
HGB: 12.2 g/dL (ref 11.6–15.9)
LYMPH%: 21.5 % (ref 14.0–49.7)
MCH: 33.6 pg (ref 25.1–34.0)
MCHC: 35.4 g/dL (ref 31.5–36.0)
MCV: 95 fL (ref 79.5–101.0)
MONO#: 0.1 10*3/uL (ref 0.1–0.9)
MONO%: 3.1 % (ref 0.0–14.0)
NEUT%: 73.7 % (ref 38.4–76.8)
NEUTROS ABS: 3.3 10*3/uL (ref 1.5–6.5)
PLATELETS: 218 10*3/uL (ref 145–400)
RBC: 3.63 10*6/uL — AB (ref 3.70–5.45)
RDW: 12.7 % (ref 11.2–14.5)
WBC: 4.5 10*3/uL (ref 3.9–10.3)
lymph#: 1 10*3/uL (ref 0.9–3.3)
nRBC: 0 % (ref 0–0)

## 2016-02-18 ENCOUNTER — Other Ambulatory Visit: Payer: Self-pay

## 2016-02-18 ENCOUNTER — Telehealth: Payer: Self-pay

## 2016-02-18 DIAGNOSIS — C50919 Malignant neoplasm of unspecified site of unspecified female breast: Secondary | ICD-10-CM

## 2016-02-18 NOTE — Telephone Encounter (Signed)
lvm insurance approved CT abd/pelvis so Dr [name] changed orders to CT a/p with CXR. Expect call from scheduler

## 2016-02-19 ENCOUNTER — Ambulatory Visit (HOSPITAL_COMMUNITY): Payer: 59

## 2016-02-19 ENCOUNTER — Telehealth: Payer: Self-pay | Admitting: *Deleted

## 2016-02-19 ENCOUNTER — Ambulatory Visit (HOSPITAL_COMMUNITY): Admission: RE | Admit: 2016-02-19 | Payer: 59 | Source: Ambulatory Visit

## 2016-02-19 NOTE — Telephone Encounter (Signed)
Pt calling from MRI states that she came today & found out that MRI cancelled.  Per notes, Insurance approved CT Abd & MRI not approved.  Message was left on pt's vm last hs.  She did not receive.  Gave her ph # for [employer] to schedule.

## 2016-02-21 ENCOUNTER — Other Ambulatory Visit: Payer: Self-pay | Admitting: Oncology

## 2016-02-21 DIAGNOSIS — C50919 Malignant neoplasm of unspecified site of unspecified female breast: Secondary | ICD-10-CM

## 2016-02-21 DIAGNOSIS — C50312 Malignant neoplasm of lower-inner quadrant of left female breast: Secondary | ICD-10-CM

## 2016-02-21 DIAGNOSIS — C7951 Secondary malignant neoplasm of bone: Principal | ICD-10-CM

## 2016-02-21 DIAGNOSIS — C50912 Malignant neoplasm of unspecified site of left female breast: Secondary | ICD-10-CM

## 2016-02-24 ENCOUNTER — Ambulatory Visit (HOSPITAL_COMMUNITY): Admission: RE | Admit: 2016-02-24 | Payer: 59 | Source: Ambulatory Visit

## 2016-02-24 ENCOUNTER — Ambulatory Visit (HOSPITAL_COMMUNITY): Payer: 59

## 2016-02-26 ENCOUNTER — Ambulatory Visit (HOSPITAL_BASED_OUTPATIENT_CLINIC_OR_DEPARTMENT_OTHER): Payer: 59 | Admitting: Oncology

## 2016-02-26 ENCOUNTER — Ambulatory Visit (HOSPITAL_BASED_OUTPATIENT_CLINIC_OR_DEPARTMENT_OTHER): Payer: 59

## 2016-02-26 ENCOUNTER — Other Ambulatory Visit: Payer: Self-pay | Admitting: *Deleted

## 2016-02-26 ENCOUNTER — Other Ambulatory Visit (HOSPITAL_BASED_OUTPATIENT_CLINIC_OR_DEPARTMENT_OTHER): Payer: 59

## 2016-02-26 ENCOUNTER — Telehealth: Payer: Self-pay | Admitting: *Deleted

## 2016-02-26 VITALS — BP 122/73 | HR 72 | Temp 98.1°F | Resp 18 | Ht 61.0 in | Wt 107.2 lb

## 2016-02-26 DIAGNOSIS — C7951 Secondary malignant neoplasm of bone: Principal | ICD-10-CM

## 2016-02-26 DIAGNOSIS — Z1379 Encounter for other screening for genetic and chromosomal anomalies: Secondary | ICD-10-CM

## 2016-02-26 DIAGNOSIS — R1909 Other intra-abdominal and pelvic swelling, mass and lump: Secondary | ICD-10-CM

## 2016-02-26 DIAGNOSIS — M810 Age-related osteoporosis without current pathological fracture: Secondary | ICD-10-CM

## 2016-02-26 DIAGNOSIS — Z5111 Encounter for antineoplastic chemotherapy: Secondary | ICD-10-CM | POA: Diagnosis not present

## 2016-02-26 DIAGNOSIS — C50312 Malignant neoplasm of lower-inner quadrant of left female breast: Secondary | ICD-10-CM

## 2016-02-26 DIAGNOSIS — C50912 Malignant neoplasm of unspecified site of left female breast: Secondary | ICD-10-CM

## 2016-02-26 DIAGNOSIS — C50919 Malignant neoplasm of unspecified site of unspecified female breast: Secondary | ICD-10-CM

## 2016-02-26 LAB — COMPREHENSIVE METABOLIC PANEL
ALBUMIN: 3.8 g/dL (ref 3.5–5.0)
ALK PHOS: 100 U/L (ref 40–150)
ALT: 50 U/L (ref 0–55)
ANION GAP: 10 meq/L (ref 3–11)
AST: 28 U/L (ref 5–34)
BUN: 12.9 mg/dL (ref 7.0–26.0)
CALCIUM: 10 mg/dL (ref 8.4–10.4)
CO2: 26 mEq/L (ref 22–29)
Chloride: 102 mEq/L (ref 98–109)
Creatinine: 1.1 mg/dL (ref 0.6–1.1)
EGFR: 57 mL/min/{1.73_m2} — AB (ref >90)
Glucose: 55 mg/dl — ABNORMAL LOW (ref 70–140)
POTASSIUM: 4.2 meq/L (ref 3.5–5.1)
Sodium: 138 mEq/L (ref 136–145)
Total Bilirubin: 0.78 mg/dL (ref 0.20–1.20)
Total Protein: 7.4 g/dL (ref 6.4–8.3)

## 2016-02-26 LAB — CBC WITH DIFFERENTIAL/PLATELET
BASO%: 0.9 % (ref 0.0–2.0)
Basophils Absolute: 0 10*3/uL (ref 0.0–0.1)
EOS%: 0.9 % (ref 0.0–7.0)
Eosinophils Absolute: 0 10*3/uL (ref 0.0–0.5)
HEMATOCRIT: 38.3 % (ref 34.8–46.6)
HGB: 13.4 g/dL (ref 11.6–15.9)
LYMPH#: 0.6 10*3/uL — AB (ref 0.9–3.3)
LYMPH%: 16 % (ref 14.0–49.7)
MCH: 34.4 pg — ABNORMAL HIGH (ref 25.1–34.0)
MCHC: 34.9 g/dL (ref 31.5–36.0)
MCV: 98.8 fL (ref 79.5–101.0)
MONO#: 0.1 10*3/uL (ref 0.1–0.9)
MONO%: 4 % (ref 0.0–14.0)
NEUT%: 78.2 % — ABNORMAL HIGH (ref 38.4–76.8)
NEUTROS ABS: 2.9 10*3/uL (ref 1.5–6.5)
PLATELETS: 285 10*3/uL (ref 145–400)
RBC: 3.88 10*6/uL (ref 3.70–5.45)
RDW: 12.8 % (ref 11.2–14.5)
WBC: 3.7 10*3/uL — AB (ref 3.9–10.3)

## 2016-02-26 LAB — CEA (IN HOUSE-CHCC): CEA (CHCC-IN HOUSE): 16.19 ng/mL — AB (ref 0.00–5.00)

## 2016-02-26 MED ORDER — FULVESTRANT 250 MG/5ML IM SOLN
500.0000 mg | Freq: Once | INTRAMUSCULAR | Status: AC
  Administered 2016-02-26: 500 mg via INTRAMUSCULAR
  Filled 2016-02-26: qty 10

## 2016-02-26 NOTE — Progress Notes (Signed)
ID: Sonia Hill   DOB: December 09, 1966  MR#: [id number]  GLO#:[id number]  Patient Care Team: [name], MD as PCP - General (Family Medicine) Chauncey Cruel, MD as Consulting Physician (Oncology) Molli Posey, MD as Consulting Physician (Obstetrics and Gynecology) Laurence Spates, MD as Consulting Physician (Gastroenterology) Leighton Ruff, MD as Consulting Physician (General Surgery) Delrae Rend, MD as Consulting Physician (Endocrinology)   CHIEF COMPLAINT: Metastatic breast cancer, estrogen receptor positive  CURRENT TREATMENT: fulvestrant, palbociclib.  BREAST CANCER HISTORY:  From the initial intake note:  Linnette had screening mammography on 11/10/2009 showing very dense tissue with a possible distortion in the left breast.  Digital mammography on June 10th showed a 1.5 cm cluster of microcalcifications in the lower inner portion of the breast.  On physical exam, there was no palpable mass and no palpable axillary adenopathy.  There was some nipple inversion inferolaterally.  Ultrasound showed a 2.6 cm irregular mass-like area with dense posterior acoustic shadowing in the 3 o'clock position, 1 cm from the nipple, but normal appearing left axillary lymph nodes.    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  A second mass noted on ultrasound was also lobular, also low grade.  Both were ER and PR positive at well over 90%.  Both had low proliferation markers at 9%, and both were Her-2 negative by CISH with ratios of 1.6 and 1.24.  In short, both masses were nearly identical.    On June 16th, the patient had bilateral breast MRIs which showed in the left breast at 3 o'clock an irregular area of abnormal enhancement measuring up to 2.7 cm and in the left lower inner quadrant, a second post-biopsy area measuring 1.5 cm.  The difference  between these two areas was stated at the conference the morning of the visit to be approximately 5 cm.  Given this data the patient opted for Left mastectomy with sentinel lymph node sampling, performed July 2011 with results as detailed below.  Subsequent history is as detailed below.  INTERVAL HISTORY: Sonia Hill returns for followup of her stage IV estrogen receptor positive breast cancer, accompanied by her husband [name].Almyra Free will receive her sixth dose of fulvestrant today. She generally tolerates this well aside from the discomfort of receiving the injection. She also continues on palbociclib at 100 mg daily, 21 days on and 7 days off. She has had excellent neutrophil counts and I think at this point we can simply check her labs on a once a month basis.  We have scheduled her for an MRI of the pelvis but her insurance refused to pay. We then schedule her for a CT of the chest abdomen and pelvis in her insurance refused to pay for the chest CT scan. She was supposed to have had the abdomen and pelvis CT scan last week but she had a "stomach bug" and she canceled that test. She has been rescheduled for next week on that CT scan.  REVIEW OF SYSTEMS: Sonia Hill tells me about 10 days ago she had a lot of nausea vomiting and diarrhea. This lasted for about 24 hours. It is not "better". She has a burning feeling in her stomach at times which improves when she eats something from the point of view of her rectal cancer involvement, she says her bowel movements aren't unchanged in size, that she has seen no blood, that she  has about 2 bowel movements a day which are soft, and that she feels a little bit of tissue hardness in that area. Overall she reports no change in any of that in the last 4 months or so. She has pain in her mid back at times, she feels fatigued, she bruises easily. A detailed review of systems today was otherwise stable  PAST MEDICAL HISTORY: Past Medical History:  Diagnosis Date  . Arthritis     neck, upper back, shoulders - no meds - yoga  . Chronic kidney disease    Only has right kidney  . Depression    Hx - no current problem  . GERD (gastroesophageal reflux disease)    diet controlled - no meds  . Hyperlipidemia    diet controlled  . Hypothyroidism   . lt breast ca dx'd 11/2009  . Osteoporosis, unspecified 12/06/2013  . PONV (postoperative nausea and vomiting)   . SVD (spontaneous vaginal delivery)    x 1  1. Osteopenia. 2. Congenital left renal atrophy.  3. History of recurrent UTIs. 4. History of bladder reconstruction for reflux more than 18 years ago. 5. History of depression and anxiety. 6. History of hypothyroidism.  7. History of "sling procedure".  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    FAMILY HISTORY The patient's father is alive in his early 43s.  He has Alzheimer's disease. The patient's mother was diagnosed with breast cancer at the age of 79.  She died at the age of 41.  The patient's father had one out of two sisters with breast cancer.  There are other breast cancers on the mother's side.  The patient has one brother and one younger sister, neither with cancer.    GYNECOLOGIC HISTORY:  (Reviewed 12/06/2013) She is GX P1.  That pregnancy was at age 80.  Menarche was at age 53.  She was perimenopausal at the time of diagnosis but is now convincingly postmenopausal  SOCIAL HISTORY:  (Reviewed 12/06/2013) She works as a Electrical engineer, part-time and on consultation.  Her husband, [name], is a Architect at [employer].  [name] works as a Pharmacist, hospital in [place].  The patient's son [name] is  in college.  The patient attends [employer].   ADVANCED DIRECTIVES: in place  HEALTH MAINTENANCE: (Updated 12/06/2013)  Social History  Substance Use Topics  . Smoking status: Never Smoker  . Smokeless tobacco: Never Used  . Alcohol use No     Colonoscopy: 06/18/2015;  [facility]  PAP: UTD/May 2015, Dr. Matthew Saras  Bone density: [name] 2014, osteoporosis  Lipid panel: UTD (Not on file)    Allergies  Allergen Reactions  . Adhesive [Tape] Rash    Ok to use paper tape and tegaderm over IV site  . Betadine [Povidone Iodine] Rash  . Mercury Rash    Reaction mercurachrome  . Sulfa Antibiotics Rash    Current Outpatient Prescriptions  Medication Sig Dispense Refill  . ascorbic acid (VITAMIN C) 1000 MG tablet Take 1,000 mg by mouth 3 (three) times daily.     . Ashwagandha 125 MG CAPS Take 125 mg by mouth daily.     . Cholecalciferol (VITAMIN D-3) 5000 UNITS TABS Take 5,000 Units by mouth daily.     Marland Kitchen FOLIC ACID PO Take 9,728 mcg by mouth daily.     Leslee Home 100 MG capsule TAKE ONE CAPSULE (100 MG) BY MOUTH DAILY WITH FOOD FOR 21  DAYS OF A 28DAY CYCLE.  SWALLOW WHOLE.  DO NOT TAKE IF CAPSULES ARE BROKEN OR CRA 21 capsule 6  . MAGNESIUM PO Take 325 mg by mouth at bedtime as needed (leg cramps). Magnesium calm - powder    . Multiple Vitamin (MULTIVITAMIN WITH MINERALS) TABS tablet Take 1 tablet by mouth daily.    . Multiple Vitamins-Minerals (HAIR/SKIN/NAILS/BIOTIN) TABS Take 1 tablet by mouth daily.    . niacin 250 MG tablet Take 250 mg by mouth at bedtime.    . Omega-3 Fatty Acids (FISH OIL) 1200 MG CAPS Take 1,200 mg by mouth 2 (two) times daily.    Marland Kitchen OVER THE COUNTER MEDICATION Take 12.5 mg by mouth daily. Iodoral Iodine  supplement    . Probiotic Product (PROBIOTIC DAILY PO) Take 1 tablet by mouth daily.     Marland Kitchen thyroid (NATURE-THROID) 65 MG tablet Take 81.25 mg by mouth daily.     No current facility-administered medications for this visit.     OBJECTIVE: Middle aged white woman In no acute distress Vitals:   02/26/16 0914  BP: 122/73  Pulse: 72  Resp: 18  Temp: 98.1 F (36.7 C)     Body mass index is 20.26 kg/m.    ECOG FS: 1 Filed Weights   02/26/16 0914  Weight: 107 lb 3.2 oz (48.6 kg)   Sclerae unicteric, EOMs intact Oropharynx clear  and moist No cervical or supraclavicular adenopathy Lungs no rales or rhonchi Heart regular rate and rhythm Abd soft, nontender, positive bowel sounds, no masses palpated MSK no focal spinal tenderness, no upper extremity lymphedema Neuro: nonfocal, well oriented, appropriate affect Breasts: Deferred   LAB RESULTS:   Lab Results  Component Value Date   WBC 4.5 02/12/2016   NEUTROABS 3.3 02/12/2016   HGB 12.2 02/12/2016   HCT 34.5 (L) 02/12/2016   MCV 95.0 02/12/2016   PLT 218 02/12/2016      Chemistry      Component Value Date/Time   NA 139 01/27/2016 1119   K 4.6 01/27/2016 1119   CL 98 12/03/2012 1419   CO2 26 01/27/2016 1119   BUN 20.2 01/27/2016 1119   CREATININE 1.2 (H) 01/27/2016 1119      Component Value Date/Time   CALCIUM 10.4 01/27/2016 1119   ALKPHOS 105 01/27/2016 1119   AST 24 01/27/2016 1119   ALT 30 01/27/2016 1119   BILITOT 0.46 01/27/2016 1119      Lab Results  Component Value Date   LABCA2 181 (H) 10/23/2015  Results for CHANTELLE, VERDI (MRN [id number]) as of 02/26/2016 09:17  Ref. Range 08/05/2015 11:34 10/23/2015 10:50 12/04/2015 10:16 01/04/2016 10:48 01/27/2016 11:19  CA 27.29 Latest Ref Range: 0.0 - 38.6 U/mL 154.2 (H) 163.4 (H) 162.3 (H) 116.1 (H) 131.0 (H)  Cancer Antigen (CA) 125 Latest Ref Range: 0.0 - 38.1 U/mL  49.3 (H)     CEA Latest Ref Range: 0.0 - 4.7 ng/mL  20.6 (H)  12.3 (H) 13.7 (H)  CEA ([facility]) Latest Ref Range: 0.00 - 5.00 ng/mL     17.09 (H)    STUDIES: No results found.   ASSESSMENT: 60 y.o.  BRCA 1-2 negative [facility] woman  (1) status post left mastectomy and sentinel lymph node sampling in July 2011 for a lower inner quadrant T1 N1 M1, stage IV invasive lobular breast cancer, grade 1, strongly estrogen and progesterone receptor-positive, HER2 negative with MIB-1 of 9% and no HER2 amplification,   (2) with multiple sclerotic bone lesions  at presentation seen only on CT scan (not on bone scan or PET scan), but   with biopsy-proven metastatic disease to bone; and an elevated CA 27.29 at presentation,   (3) Oncotype recurrence score of 4, predicts a good response to antiestrogens.  (4) Systemic treatment has consisted of  a) tamoxifen with evidence of response but poor tolerance  b) letrozole starting August 2012, discontinued October 2014 per patient   (5) single functioning kidney  (6) status post bilateral salpingo-oophorectomy 01/24/2013, with benign pathology  (7) osteoporosis; the patient refuses bisphosphonate therapy; started [facility] February 2014.    (a) bone density was obtained under the care of Dr. Matthew Saras at [facility], 06/25/2012, showing osteoporosis with a T score -2.6. This was repeated 12/20/2013, showing again osteoporosis with T-scores between -2.4 and -2.8.  (8) the patient refuses standard Mammography or tomography; undergoing thermography screening of the right breast.  (9) PET scan 06/05/2015 shows rectal thickening and a presacral mass; biopsy of this area 10/09/2015 confirms metastatic lobular breast cancer, again estrogen receptor positive, HER-2 nonamplified.  (10) started fulvestrant and palbociclib 125 mg/ day [21/7] May 2017  (a) palbociclib dose decreased to 100 mg daily [21/7] with second cycle, started 11/25/2015   PLAN:  I spent a little over 40 minutes today with Almyra Free and [name] going over her situation.  She is tolerating the current treatment generally well. Clinically she is stable. She has not lost weight, and she has not had progressive symptoms of rectal dysfunction. Her CA-27-29 does not show a clear trend. All these suggest that we may be achieving a measure of control of her cancer. Of course the key test is pending--as noted above her insurance refused to pay for the MRI or chest CT, and Terriona herself we scheduled a CT of the abdomen and pelvis because of abdominal issues.   The CT scan next week will give [place] more information.  She is concerned regarding radiation and also having to drink the contrast and we discussed that extensively today. I will call Janesa with results. We reviewed again the fact that our goal is control and if we are controlling her cancer then we are achieving the goal. If it shrinks of course we will be even more placed.  I told her that if we see evidence of disease progression we will likely go to exemestane and everolimus so she can start reading up on those medications.  They have obtained a $6000 credit on their fulvestrant, which means all her treatments through the 19th dose will be covered. After that she will have to meet with our billing folks again to see they can find other funds for her.  I have made her a return appointment for October 13, which is the date of her next fulvestrant dose. If we are not going to make any change in her treatment she may want to cancel that appointment and rescheduled for December. We will discuss that when I call her with the CT results next week  She knows to contact us for any problems that may develop before her next visit.       Chauncey Cruel, MD 02/26/2016

## 2016-02-26 NOTE — Patient Instructions (Signed)

## 2016-02-26 NOTE — Telephone Encounter (Signed)
Trea called this RN to state post discussion with MD this AM and MRI which was cancelled due to insurance denial- Dzyre states she contacted her insurance company and inquired about reasons she was denied.  Barbee states " they said they never received an order for an MRI - and said it should be approved due to prior one and make sure the order says with and without contrast "  This RN informed Josilin above would be requested for next available.  Prior authorization takes 3 business days and we should know by late next week.  Chantay would prefer to have MRI for appropriate comparison she will reschedule CT to allow for insurance review for MRI.  Order enter per above.

## 2016-02-27 LAB — CANCER ANTIGEN 27.29: CA 27.29: 114.7 U/mL — ABNORMAL HIGH (ref 0.0–38.6)

## 2016-02-27 LAB — CEA: CEA: 14.6 ng/mL — ABNORMAL HIGH (ref 0.0–4.7)

## 2016-02-28 ENCOUNTER — Other Ambulatory Visit: Payer: Self-pay | Admitting: Oncology

## 2016-02-28 DIAGNOSIS — C7951 Secondary malignant neoplasm of bone: Principal | ICD-10-CM

## 2016-02-28 DIAGNOSIS — R1909 Other intra-abdominal and pelvic swelling, mass and lump: Secondary | ICD-10-CM

## 2016-02-28 DIAGNOSIS — C50919 Malignant neoplasm of unspecified site of unspecified female breast: Secondary | ICD-10-CM

## 2016-03-02 ENCOUNTER — Ambulatory Visit (HOSPITAL_COMMUNITY): Admission: RE | Admit: 2016-03-02 | Payer: 59 | Source: Ambulatory Visit

## 2016-03-02 ENCOUNTER — Ambulatory Visit (HOSPITAL_COMMUNITY): Payer: 59

## 2016-03-11 ENCOUNTER — Ambulatory Visit (HOSPITAL_COMMUNITY)
Admission: RE | Admit: 2016-03-11 | Discharge: 2016-03-11 | Disposition: A | Discharge status: Home / Self Care | Payer: 59 | Source: Ambulatory Visit | Attending: Oncology | Admitting: Oncology

## 2016-03-11 DIAGNOSIS — C50919 Malignant neoplasm of unspecified site of unspecified female breast: Secondary | ICD-10-CM | POA: Diagnosis not present

## 2016-03-11 DIAGNOSIS — R1909 Other intra-abdominal and pelvic swelling, mass and lump: Secondary | ICD-10-CM | POA: Diagnosis present

## 2016-03-11 DIAGNOSIS — C7951 Secondary malignant neoplasm of bone: Secondary | ICD-10-CM | POA: Insufficient documentation

## 2016-03-11 MED ORDER — GADOBENATE DIMEGLUMINE 529 MG/ML IV SOLN
10.0000 mL | Freq: Once | INTRAVENOUS | Status: AC | PRN
  Administered 2016-03-11: 9 mL via INTRAVENOUS

## 2016-03-24 ENCOUNTER — Other Ambulatory Visit: Payer: Self-pay | Admitting: *Deleted

## 2016-03-25 ENCOUNTER — Ambulatory Visit (HOSPITAL_BASED_OUTPATIENT_CLINIC_OR_DEPARTMENT_OTHER): Payer: 59 | Admitting: Oncology

## 2016-03-25 ENCOUNTER — Ambulatory Visit (HOSPITAL_BASED_OUTPATIENT_CLINIC_OR_DEPARTMENT_OTHER): Payer: 59

## 2016-03-25 ENCOUNTER — Other Ambulatory Visit: Payer: Self-pay | Admitting: *Deleted

## 2016-03-25 ENCOUNTER — Ambulatory Visit (HOSPITAL_COMMUNITY)
Admission: RE | Admit: 2016-03-25 | Discharge: 2016-03-25 | Disposition: A | Discharge status: Home / Self Care | Payer: 59 | Source: Ambulatory Visit | Attending: Oncology | Admitting: Oncology

## 2016-03-25 ENCOUNTER — Other Ambulatory Visit (HOSPITAL_BASED_OUTPATIENT_CLINIC_OR_DEPARTMENT_OTHER): Payer: 59

## 2016-03-25 VITALS — BP 133/62 | HR 75 | Temp 97.6°F | Resp 18 | Ht 61.0 in | Wt 108.2 lb

## 2016-03-25 DIAGNOSIS — C50912 Malignant neoplasm of unspecified site of left female breast: Secondary | ICD-10-CM

## 2016-03-25 DIAGNOSIS — C50911 Malignant neoplasm of unspecified site of right female breast: Secondary | ICD-10-CM

## 2016-03-25 DIAGNOSIS — C7951 Secondary malignant neoplasm of bone: Secondary | ICD-10-CM | POA: Diagnosis not present

## 2016-03-25 DIAGNOSIS — Z5111 Encounter for antineoplastic chemotherapy: Secondary | ICD-10-CM

## 2016-03-25 DIAGNOSIS — M25572 Pain in left ankle and joints of left foot: Secondary | ICD-10-CM | POA: Diagnosis not present

## 2016-03-25 DIAGNOSIS — M810 Age-related osteoporosis without current pathological fracture: Secondary | ICD-10-CM

## 2016-03-25 DIAGNOSIS — C50312 Malignant neoplasm of lower-inner quadrant of left female breast: Secondary | ICD-10-CM

## 2016-03-25 DIAGNOSIS — Z170 Estrogen receptor positive status [ER+]: Secondary | ICD-10-CM

## 2016-03-25 DIAGNOSIS — M7989 Other specified soft tissue disorders: Secondary | ICD-10-CM

## 2016-03-25 DIAGNOSIS — M7122 Synovial cyst of popliteal space [Baker], left knee: Secondary | ICD-10-CM

## 2016-03-25 DIAGNOSIS — Z1379 Encounter for other screening for genetic and chromosomal anomalies: Secondary | ICD-10-CM

## 2016-03-25 LAB — CBC WITH DIFFERENTIAL/PLATELET
BASO%: 0.8 % (ref 0.0–2.0)
BASOS ABS: 0 10*3/uL (ref 0.0–0.1)
EOS%: 0.6 % (ref 0.0–7.0)
Eosinophils Absolute: 0 10*3/uL (ref 0.0–0.5)
HCT: 37.1 % (ref 34.8–46.6)
HGB: 12.8 g/dL (ref 11.6–15.9)
LYMPH%: 16 % (ref 14.0–49.7)
MCH: 33.9 pg (ref 25.1–34.0)
MCHC: 34.5 g/dL (ref 31.5–36.0)
MCV: 98.3 fL (ref 79.5–101.0)
MONO#: 0.1 10*3/uL (ref 0.1–0.9)
MONO%: 4.1 % (ref 0.0–14.0)
NEUT#: 2.8 10*3/uL (ref 1.5–6.5)
NEUT%: 78.5 % — AB (ref 38.4–76.8)
Platelets: 336 10*3/uL (ref 145–400)
RBC: 3.77 10*6/uL (ref 3.70–5.45)
RDW: 13 % (ref 11.2–14.5)
WBC: 3.6 10*3/uL — ABNORMAL LOW (ref 3.9–10.3)
lymph#: 0.6 10*3/uL — ABNORMAL LOW (ref 0.9–3.3)

## 2016-03-25 LAB — COMPREHENSIVE METABOLIC PANEL
ALT: 59 U/L — ABNORMAL HIGH (ref 0–55)
AST: 36 U/L — AB (ref 5–34)
Albumin: 3.9 g/dL (ref 3.5–5.0)
Alkaline Phosphatase: 79 U/L (ref 40–150)
Anion Gap: 8 mEq/L (ref 3–11)
BUN: 14.8 mg/dL (ref 7.0–26.0)
CHLORIDE: 99 meq/L (ref 98–109)
CO2: 25 mEq/L (ref 22–29)
Calcium: 9.5 mg/dL (ref 8.4–10.4)
Creatinine: 0.9 mg/dL (ref 0.6–1.1)
EGFR: 70 mL/min/{1.73_m2} — AB (ref >90)
GLUCOSE: 87 mg/dL (ref 70–140)
POTASSIUM: 4.6 meq/L (ref 3.5–5.1)
SODIUM: 131 meq/L — AB (ref 136–145)
Total Bilirubin: 0.59 mg/dL (ref 0.20–1.20)
Total Protein: 6.9 g/dL (ref 6.4–8.3)

## 2016-03-25 LAB — CEA (IN HOUSE-CHCC): CEA (CHCC-IN HOUSE): 15.63 ng/mL — AB (ref 0.00–5.00)

## 2016-03-25 MED ORDER — FULVESTRANT 250 MG/5ML IM SOLN
500.0000 mg | Freq: Once | INTRAMUSCULAR | Status: AC
  Administered 2016-03-25: 500 mg via INTRAMUSCULAR
  Filled 2016-03-25: qty 10

## 2016-03-25 MED ORDER — DOCUSATE SODIUM 250 MG PO CAPS: 250.0000 mg | ORAL_CAPSULE | Freq: Every day | ORAL | 0 refills | Status: DC

## 2016-03-25 MED ORDER — POLYETHYLENE GLYCOL 3350 17 GM/SCOOP PO POWD: 1.0000 | Freq: Once | ORAL | 0 refills | Status: AC

## 2016-03-25 NOTE — Progress Notes (Signed)
ID: [name]   DOB: 1955/07/22  MR#: [id number]  WGY#:[id number]  Patient Care Team: Sonia Elbe, MD as PCP - General (Family Medicine) Sonia Cruel, MD as Consulting Physician (Oncology) Sonia Posey, MD as Consulting Physician (Obstetrics and Gynecology) Sonia Spates, MD as Consulting Physician (Gastroenterology) Sonia Ruff, MD as Consulting Physician (General Surgery) Sonia Rend, MD as Consulting Physician (Endocrinology)   CHIEF COMPLAINT: Metastatic breast cancer, estrogen receptor positive  CURRENT TREATMENT: fulvestrant, palbociclib.  BREAST CANCER HISTORY:  From the initial intake note:  Sonia Hill had screening mammography on 11/10/2009 showing very dense tissue with a possible distortion in the left breast.  Digital mammography on June 10th showed a 1.5 cm cluster of microcalcifications in the lower inner portion of the breast.  On physical exam, there was no palpable mass and no palpable axillary adenopathy.  There was some nipple inversion inferolaterally.  Ultrasound showed a 2.6 cm irregular mass-like area with dense posterior acoustic shadowing in the 3 o'clock position, 1 cm from the nipple, but normal appearing left axillary lymph nodes.    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  A second mass noted on ultrasound was also lobular, also low grade.  Both were ER and PR positive at well over 90%.  Both had low proliferation markers at 9%, and both were Her-2 negative by CISH with ratios of 1.6 and 1.24.  In short, both masses were nearly identical.    On June 16th, the patient had bilateral breast MRIs which showed in the left breast at 3 o'clock an irregular area of abnormal enhancement measuring up to 2.7 cm and in the left lower inner quadrant, a second post-biopsy area measuring 1.5 cm.  The difference  between these two areas was stated at the conference the morning of the visit to be approximately 5 cm.  Given this data the patient opted for Left mastectomy with sentinel lymph node sampling, performed July 2011 with results as detailed below.  Subsequent history is as detailed below.  INTERVAL HISTORY: Sonia Hill returns for followup of her metastatic breast cancer. Since her last visit here she was able to obtain a pelvic MRI. This shows continuing thickening of the rectal wall, not significantly changed from prior. There are no other findings including no adenopathy and no bony involvement.  She continues on fulvestrant and pelvis palbociclib. She is currently on the second week of her current problem palbociclib cycle. She tolerates these well, and reports no new symptoms regarding these medications aside from easy bruising, which will is not a new problem.  She tells me she had thermography (she refuses mammography) and that they will be sending me a copy of that report.  REVIEW OF SYSTEMS: Sonia Hill "good out of bed on the wrong side today." She just doesn't feel good. She stopped using stool softeners and only uses MiraLAX irregularly. She is juicing instead. As a result she was moderately constipated and there was some blood and discomfort with her most recent bowel movement. The blood was scant bright red and on the tissue. She has had some left ankle swelling which concerns her. She has a bruise in the lower leg and she does not know how she bumped it. She has some tinnitus. She is concerned about finances. Aside from these issues a detailed review of systems today was stable  PAST MEDICAL HISTORY: Past Medical  History:  Diagnosis Date  . Arthritis    neck, upper back, shoulders - no meds - yoga  . Chronic kidney disease    Only has right kidney  . Depression    Hx - no current problem  . GERD (gastroesophageal reflux disease)    diet controlled - no meds  . Hyperlipidemia    diet  controlled  . Hypothyroidism   . lt breast ca dx'd 11/2009  . Osteoporosis, unspecified 12/06/2013  . PONV (postoperative nausea and vomiting)   . SVD (spontaneous vaginal delivery)    x 1  1. Osteopenia. 2. Congenital left renal atrophy.  3. History of recurrent UTIs. 4. History of bladder reconstruction for reflux more than 18 years ago. 5. History of depression and anxiety. 6. History of hypothyroidism.  7. History of "sling procedure".  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    FAMILY HISTORY The patient's father is alive in his early 49s.  He has Alzheimer's disease. The patient's mother was diagnosed with breast cancer at the age of 91.  She died at the age of 76.  The patient's father had one out of two sisters with breast cancer.  There are other breast cancers on the mother's side.  The patient has one brother and one younger sister, neither with cancer.    GYNECOLOGIC HISTORY:  (Reviewed 12/06/2013) She is GX P1.  That pregnancy was at age 89.  Menarche was at age 36.  She was perimenopausal at the time of diagnosis but is now convincingly postmenopausal  SOCIAL HISTORY:  (Reviewed 12/06/2013) She works as a Electrical engineer, part-time and on consultation.  Her husband, Sonia Hill, is a Architect at [employer].  Sister Sonia Hill works as a Pharmacist, hospital in [place].  The patient's son Sonia Hill is  in college.  The patient attends [employer].   ADVANCED DIRECTIVES: in place  HEALTH MAINTENANCE: (Updated 12/06/2013)  Social History  Substance Use Topics  . Smoking status: Never Smoker  . Smokeless tobacco: Never Used  . Alcohol use No     Colonoscopy: 06/18/2015; [facility]  PAP: UTD/May 2015, Sonia Hill  Bone density: Sonia Hill 2014, osteoporosis  Lipid panel: UTD (Not on file)    Allergies  Allergen Reactions  . Adhesive [Tape] Rash    Ok to use paper tape and tegaderm over IV site  .  Betadine [Povidone Iodine] Rash  . Mercury Rash    Reaction mercurachrome  . Sulfa Antibiotics Rash    Current Outpatient Prescriptions  Medication Sig Dispense Refill  . ascorbic acid (VITAMIN C) 1000 MG tablet Take 1,000 mg by mouth 3 (three) times daily.     . Ashwagandha 125 MG CAPS Take 125 mg by mouth daily.     . Cholecalciferol (VITAMIN D-3) 5000 UNITS TABS Take 5,000 Units by mouth daily.     Marland Kitchen docusate sodium (COLACE) 250 MG capsule Take 1 capsule (250 mg total) by mouth daily. 10 capsule 0  . FOLIC ACID PO Take 2,563 mcg by mouth daily.     Leslee Home 100 MG capsule TAKE ONE CAPSULE (100 MG) BY MOUTH DAILY WITH FOOD FOR 21 DAYS OF A 28DAY CYCLE.  SWALLOW WHOLE.  DO NOT TAKE IF CAPSULES ARE BROKEN OR CRA 21 capsule 6  . MAGNESIUM PO Take 325 mg by mouth at bedtime as needed (leg cramps). Magnesium calm - powder    . Multiple Vitamin (MULTIVITAMIN WITH MINERALS)  TABS tablet Take 1 tablet by mouth daily.    . Multiple Vitamins-Minerals (HAIR/SKIN/NAILS/BIOTIN) TABS Take 1 tablet by mouth daily.    . niacin 250 MG tablet Take 250 mg by mouth at bedtime.    . Omega-3 Fatty Acids (FISH OIL) 1200 MG CAPS Take 1,200 mg by mouth 2 (two) times daily.    Marland Kitchen OVER THE COUNTER MEDICATION Take 12.5 mg by mouth daily. Iodoral Iodine  supplement    . polyethylene glycol powder (MIRALAX) powder Take 255 g by mouth once. 255 g 0  . Probiotic Product (PROBIOTIC DAILY PO) Take 1 tablet by mouth daily.     Marland Kitchen thyroid (NATURE-THROID) 65 MG tablet Take 81.25 mg by mouth daily.     No current facility-administered medications for this visit.     OBJECTIVE: Middle aged white woman Who appears stated age 24:   03/25/16 0916  BP: 133/62  Pulse: 75  Resp: 18  Temp: 97.6 F (36.4 C)     Body mass index is 20.44 kg/m.    ECOG FS: 1 Filed Weights   03/25/16 0916  Weight: 108 lb 3.2 oz (49.1 kg)   Sclerae unicteric, pupils round and equal Oropharynx clear and moist-- no thrush or other  lesions No cervical or supraclavicular adenopathy Lungs no rales or rhonchi Heart regular rate and rhythm Abd soft, nontender, positive bowel sounds MSK no focal spinal tenderness, minimal left ankle edema Neuro: nonfocal, well oriented, appropriate affect Breasts: Deferred     LAB RESULTS:   Lab Results  Component Value Date   WBC 3.6 (L) 03/25/2016   NEUTROABS 2.8 03/25/2016   HGB 12.8 03/25/2016   HCT 37.1 03/25/2016   MCV 98.3 03/25/2016   PLT 336 03/25/2016      Chemistry      Component Value Date/Time   NA 138 02/26/2016 0859   K 4.2 02/26/2016 0859   CL 98 12/03/2012 1419   CO2 26 02/26/2016 0859   BUN 12.9 02/26/2016 0859   CREATININE 1.1 02/26/2016 0859      Component Value Date/Time   CALCIUM 10.0 02/26/2016 0859   ALKPHOS 100 02/26/2016 0859   AST 28 02/26/2016 0859   ALT 50 02/26/2016 0859   BILITOT 0.78 02/26/2016 0859      Lab Results  Component Value Date   LABCA2 181 (H) 10/23/2015   STUDIES: Mr Pelvis W Wo Contrast  Result Date: 03/11/2016 CLINICAL DATA:  Stage IV breast cancer with new presacral mass. Metastatic lobular breast cancer on biopsy 10/09/2015. Evaluate for therapy response. EXAM: MRI PELVIS WITHOUT AND WITH CONTRAST TECHNIQUE: Multiplanar multisequence MR imaging of the pelvis was performed both before and after administration of intravenous contrast. CONTRAST:  29m MULTIHANCE GADOBENATE DIMEGLUMINE 529 MG/ML IV SOLN COMPARISON:  PET-CT 06/05/2015, MRI pelvis 09/16/2015 and images from CT biopsy 10/09/2015 FINDINGS: Urinary Tract: Congenital or surgical absence of the left kidney. The visualized right kidney and right ureter appear unremarkable. The bladder appears unremarkable. Bowel: Circumferential wall thickening and enhancement again noted throughout the rectum, similar to most recent prior studies. No focal mucosal lesion identified. There is no evidence of bowel obstruction. Vascular/Lymphatic: No enlarged pelvic lymph nodes  identified. Stable nodularity along the iliac vessels bilaterally, attributed to adnexal remnants. No significant vascular findings. Reproductive: Small posterior uterine fibroid. As above, probable adnexal remnants bilaterally status post reported bilateral salpingo-oophorectomy. Other: No discrete presacral soft tissue mass demonstrated. There is mild presacral edema and a small amount of free pelvic fluid. No peritoneal  masses are seen. Musculoskeletal: No acute or worrisome osseous findings. There is some synovial enhancement associated with the facet joints in the lower lumbar spine. IMPRESSION: 1. No discrete residual presacral or other pelvic mass demonstrated. 2. There is nonspecific circumferential rectal wall thickening and enhancement, similar to the previous PET CT and MRI. I do not see evidence of previous pelvic radiation in the patient's record. This could be inflammatory. Colonoscopy recommended if not previously performed. 3. No worrisome osseous findings. Electronically Signed   By: Richardean Sale M.D.   On: 03/11/2016 11:54     ASSESSMENT: 60 y.o.  BRCA 1-2 negative [facility] woman  (1) status post left mastectomy and sentinel lymph node sampling in July 2011 for a lower inner quadrant T1 N1 M1, stage IV invasive lobular breast cancer, grade 1, strongly estrogen and progesterone receptor-positive, HER2 negative with MIB-1 of 9% and no HER2 amplification,   (2) with multiple sclerotic bone lesions at presentation seen only on CT scan (not on bone scan or PET scan), but  with biopsy-proven metastatic disease to bone; and an elevated CA 27.29 at presentation,   (3) Oncotype recurrence score of 4, predicts a good response to antiestrogens.  (4) Systemic treatment has consisted of  a) tamoxifen with evidence of response but poor tolerance  b) letrozole starting August 2012, discontinued October 2014 per patient   (5) single functioning kidney  (6) status post bilateral  salpingo-oophorectomy 01/24/2013, with benign pathology  (7) osteoporosis; the patient refuses bisphosphonate therapy; started [facility] February 2014.    (a) bone density was obtained under the care of Sonia Hill at [facility], 06/25/2012, showing osteoporosis with a T score -2.6. This was repeated 12/20/2013, showing again osteoporosis with T-scores between -2.4 and -2.8.  (8) the patient refuses standard Mammography or tomography; undergoing thermography screening of the right breast.  (9) PET scan 06/05/2015 shows rectal thickening and a presacral mass; biopsy of this area 10/09/2015 confirms metastatic lobular breast cancer, again estrogen receptor positive, HER-2 nonamplified.  (a) pelvic MRI 03/11/2016 confirms stability of disease.  (10) started fulvestrant and palbociclib 125 mg/ day [21/7] May 2017  (a) palbociclib dose decreased to 100 mg daily [21/7] with second cycle, started 11/25/2015   PLAN:  I don't think the left ankle swelling is going to be due to a clot but we do need to rule that out so Richard will be sent for Doppler ultrasound of the left lower extremity this morning.  She is tolerating the fulvestrant and probable cyclic generally well. Her tumor is very stable. Accordingly were making no changes in her treatment at this point. She will continue to receive the fulvestrant every 28 days with labs every 28 days and we will not make any further changes in her palpable palbociclib dose unless there is a significant drop in her neutrophil count.  She is going to see me again in January. Normally we would repeat scans before that visit but there are some financial issues and so we are going to discuss it further at that time. She will also meet with our financial counselor.  She has a good understanding of this plan. She agrees with it. She knows a goal of treatment in her case is control. She will call with any new problems that may develop before  the next visit    ADDENDUM: The Doppler ultrasonography of the left lower extremity was negative for clot, but did show a Baker's cyst. I gave her some  information regarding this. It requires only observation for now.       Sonia Cruel, MD 03/25/2016

## 2016-03-25 NOTE — Patient Instructions (Signed)

## 2016-03-25 NOTE — Progress Notes (Signed)
*  PRELIMINARY RESULTS* Vascular Ultrasound Left lower extremity venous duplex has been completed.  Preliminary findings: no evidence of DVT. Left baker' cyst noted.   Called results to Dr. [name].    Landry Mellow, RDMS, RVT  03/25/2016, 10:38 AM

## 2016-03-26 LAB — CANCER ANTIGEN 27.29: CAN 27.29: 112.4 U/mL — AB (ref 0.0–38.6)

## 2016-03-30 ENCOUNTER — Telehealth: Payer: Self-pay

## 2016-03-30 NOTE — Telephone Encounter (Signed)
[facility] called to clarify miralax order. S/w Dr [name] and rx is 17 gm daily prn. [facility] informed.

## 2016-04-21 ENCOUNTER — Ambulatory Visit (HOSPITAL_BASED_OUTPATIENT_CLINIC_OR_DEPARTMENT_OTHER): Payer: 59

## 2016-04-21 ENCOUNTER — Other Ambulatory Visit (HOSPITAL_BASED_OUTPATIENT_CLINIC_OR_DEPARTMENT_OTHER): Payer: 59

## 2016-04-21 VITALS — BP 134/65 | HR 79 | Temp 98.6°F | Resp 18

## 2016-04-21 DIAGNOSIS — Z170 Estrogen receptor positive status [ER+]: Secondary | ICD-10-CM

## 2016-04-21 DIAGNOSIS — C50912 Malignant neoplasm of unspecified site of left female breast: Secondary | ICD-10-CM

## 2016-04-21 DIAGNOSIS — C7951 Secondary malignant neoplasm of bone: Secondary | ICD-10-CM | POA: Diagnosis not present

## 2016-04-21 DIAGNOSIS — Z5111 Encounter for antineoplastic chemotherapy: Secondary | ICD-10-CM

## 2016-04-21 DIAGNOSIS — M810 Age-related osteoporosis without current pathological fracture: Secondary | ICD-10-CM

## 2016-04-21 DIAGNOSIS — C50312 Malignant neoplasm of lower-inner quadrant of left female breast: Secondary | ICD-10-CM | POA: Diagnosis not present

## 2016-04-21 DIAGNOSIS — Z1379 Encounter for other screening for genetic and chromosomal anomalies: Secondary | ICD-10-CM

## 2016-04-21 LAB — COMPREHENSIVE METABOLIC PANEL
ALT: 52 U/L (ref 0–55)
AST: 33 U/L (ref 5–34)
Albumin: 3.8 g/dL (ref 3.5–5.0)
Alkaline Phosphatase: 81 U/L (ref 40–150)
Anion Gap: 10 mEq/L (ref 3–11)
BUN: 16.4 mg/dL (ref 7.0–26.0)
CALCIUM: 9.8 mg/dL (ref 8.4–10.4)
CHLORIDE: 101 meq/L (ref 98–109)
CO2: 26 meq/L (ref 22–29)
CREATININE: 1.1 mg/dL (ref 0.6–1.1)
EGFR: 52 mL/min/{1.73_m2} — ABNORMAL LOW (ref >90)
GLUCOSE: 82 mg/dL (ref 70–140)
POTASSIUM: 4.2 meq/L (ref 3.5–5.1)
SODIUM: 137 meq/L (ref 136–145)
Total Bilirubin: 0.56 mg/dL (ref 0.20–1.20)
Total Protein: 6.9 g/dL (ref 6.4–8.3)

## 2016-04-21 LAB — CBC WITH DIFFERENTIAL/PLATELET
BASO%: 1 % (ref 0.0–2.0)
BASOS ABS: 0 10*3/uL (ref 0.0–0.1)
EOS%: 1 % (ref 0.0–7.0)
Eosinophils Absolute: 0 10*3/uL (ref 0.0–0.5)
HCT: 33.9 % — ABNORMAL LOW (ref 34.8–46.6)
HGB: 12.2 g/dL (ref 11.6–15.9)
LYMPH#: 1 10*3/uL (ref 0.9–3.3)
LYMPH%: 25.8 % (ref 14.0–49.7)
MCH: 34.4 pg — AB (ref 25.1–34.0)
MCHC: 36 g/dL (ref 31.5–36.0)
MCV: 95.5 fL (ref 79.5–101.0)
MONO#: 0.2 10*3/uL (ref 0.1–0.9)
MONO%: 4.8 % (ref 0.0–14.0)
NEUT#: 2.7 10*3/uL (ref 1.5–6.5)
NEUT%: 67.4 % (ref 38.4–76.8)
Platelets: 294 10*3/uL (ref 145–400)
RBC: 3.55 10*6/uL — AB (ref 3.70–5.45)
RDW: 12.6 % (ref 11.2–14.5)
WBC: 4 10*3/uL (ref 3.9–10.3)

## 2016-04-21 MED ORDER — FULVESTRANT 250 MG/5ML IM SOLN
500.0000 mg | Freq: Once | INTRAMUSCULAR | Status: AC
  Administered 2016-04-21: 500 mg via INTRAMUSCULAR
  Filled 2016-04-21: qty 10

## 2016-04-22 ENCOUNTER — Other Ambulatory Visit: Payer: 59

## 2016-04-22 ENCOUNTER — Ambulatory Visit: Payer: 59

## 2016-04-22 LAB — CANCER ANTIGEN 27.29: CA 27.29: 94.4 U/mL — ABNORMAL HIGH (ref 0.0–38.6)

## 2016-04-22 LAB — CEA (IN HOUSE-CHCC): CEA (CHCC-IN HOUSE): 13.39 ng/mL — AB (ref 0.00–5.00)

## 2016-05-13 ENCOUNTER — Other Ambulatory Visit (HOSPITAL_BASED_OUTPATIENT_CLINIC_OR_DEPARTMENT_OTHER): Payer: 59

## 2016-05-13 ENCOUNTER — Telehealth: Payer: Self-pay | Admitting: Medical Oncology

## 2016-05-13 DIAGNOSIS — M810 Age-related osteoporosis without current pathological fracture: Secondary | ICD-10-CM

## 2016-05-13 DIAGNOSIS — C7951 Secondary malignant neoplasm of bone: Secondary | ICD-10-CM

## 2016-05-13 DIAGNOSIS — C50312 Malignant neoplasm of lower-inner quadrant of left female breast: Secondary | ICD-10-CM

## 2016-05-13 DIAGNOSIS — C50912 Malignant neoplasm of unspecified site of left female breast: Secondary | ICD-10-CM

## 2016-05-13 LAB — CBC WITH DIFFERENTIAL/PLATELET
BASO%: 1 % (ref 0.0–2.0)
Basophils Absolute: 0 10*3/uL (ref 0.0–0.1)
EOS%: 1.2 % (ref 0.0–7.0)
Eosinophils Absolute: 0 10*3/uL (ref 0.0–0.5)
HEMATOCRIT: 36.3 % (ref 34.8–46.6)
HEMOGLOBIN: 12.5 g/dL (ref 11.6–15.9)
LYMPH#: 0.8 10*3/uL — AB (ref 0.9–3.3)
LYMPH%: 22.1 % (ref 14.0–49.7)
MCH: 34.3 pg — ABNORMAL HIGH (ref 25.1–34.0)
MCHC: 34.4 g/dL (ref 31.5–36.0)
MCV: 99.8 fL (ref 79.5–101.0)
MONO#: 0.3 10*3/uL (ref 0.1–0.9)
MONO%: 8.4 % (ref 0.0–14.0)
NEUT%: 67.3 % (ref 38.4–76.8)
NEUTROS ABS: 2.4 10*3/uL (ref 1.5–6.5)
PLATELETS: 227 10*3/uL (ref 145–400)
RBC: 3.63 10*6/uL — ABNORMAL LOW (ref 3.70–5.45)
RDW: 13.5 % (ref 11.2–14.5)
WBC: 3.6 10*3/uL — AB (ref 3.9–10.3)

## 2016-05-13 LAB — COMPREHENSIVE METABOLIC PANEL
ALBUMIN: 3.6 g/dL (ref 3.5–5.0)
ALT: 32 U/L (ref 0–55)
ANION GAP: 9 meq/L (ref 3–11)
AST: 30 U/L (ref 5–34)
Alkaline Phosphatase: 86 U/L (ref 40–150)
BILIRUBIN TOTAL: 0.43 mg/dL (ref 0.20–1.20)
BUN: 15.2 mg/dL (ref 7.0–26.0)
CALCIUM: 9.9 mg/dL (ref 8.4–10.4)
CHLORIDE: 101 meq/L (ref 98–109)
CO2: 27 mEq/L (ref 22–29)
CREATININE: 0.8 mg/dL (ref 0.6–1.1)
EGFR: 76 mL/min/{1.73_m2} — ABNORMAL LOW (ref >90)
Glucose: 72 mg/dl (ref 70–140)
Potassium: 4.6 mEq/L (ref 3.5–5.1)
Sodium: 136 mEq/L (ref 136–145)
TOTAL PROTEIN: 6.9 g/dL (ref 6.4–8.3)

## 2016-05-13 LAB — CEA (IN HOUSE-CHCC): CEA (CHCC-IN HOUSE): 16.42 ng/mL — AB (ref 0.00–5.00)

## 2016-05-13 NOTE — Telephone Encounter (Signed)
Pt calling to ask if her blood counts were okay for her to start her Ibrance.? She would like a call back before the end of the day.

## 2016-05-13 NOTE — Telephone Encounter (Signed)
This RN attempted to return call and obtained indentified VM. Message left informing pt of lab results with ok to start next cycle of Ibrance.

## 2016-05-14 LAB — CANCER ANTIGEN 27.29: CAN 27.29: 118.6 U/mL — AB (ref 0.0–38.6)

## 2016-05-18 ENCOUNTER — Telehealth: Payer: Self-pay | Admitting: *Deleted

## 2016-05-18 NOTE — Telephone Encounter (Signed)
patietn called and moved her appt to earlier in the morning.

## 2016-05-18 NOTE — Telephone Encounter (Signed)
"  I need to reschedule my appointment for injection Friday to an earlier or later appointment. I have to work and come in before or after work."  Call transferred infusion Marketing executive.

## 2016-05-20 ENCOUNTER — Other Ambulatory Visit: Payer: 59

## 2016-05-20 ENCOUNTER — Ambulatory Visit (HOSPITAL_BASED_OUTPATIENT_CLINIC_OR_DEPARTMENT_OTHER): Payer: 59

## 2016-05-20 VITALS — BP 126/60 | HR 74 | Temp 98.0°F | Resp 20

## 2016-05-20 DIAGNOSIS — C50312 Malignant neoplasm of lower-inner quadrant of left female breast: Secondary | ICD-10-CM

## 2016-05-20 DIAGNOSIS — C7951 Secondary malignant neoplasm of bone: Secondary | ICD-10-CM | POA: Diagnosis not present

## 2016-05-20 DIAGNOSIS — Z5111 Encounter for antineoplastic chemotherapy: Secondary | ICD-10-CM

## 2016-05-20 DIAGNOSIS — M810 Age-related osteoporosis without current pathological fracture: Secondary | ICD-10-CM

## 2016-05-20 DIAGNOSIS — C50912 Malignant neoplasm of unspecified site of left female breast: Secondary | ICD-10-CM

## 2016-05-20 DIAGNOSIS — Z1379 Encounter for other screening for genetic and chromosomal anomalies: Secondary | ICD-10-CM

## 2016-05-20 DIAGNOSIS — Z170 Estrogen receptor positive status [ER+]: Secondary | ICD-10-CM

## 2016-05-20 MED ORDER — FULVESTRANT 250 MG/5ML IM SOLN
500.0000 mg | Freq: Once | INTRAMUSCULAR | Status: AC
  Administered 2016-05-20: 500 mg via INTRAMUSCULAR
  Filled 2016-05-20: qty 10

## 2016-05-20 NOTE — Patient Instructions (Signed)

## 2016-05-27 ENCOUNTER — Telehealth: Payer: Self-pay | Admitting: *Deleted

## 2016-05-27 NOTE — Telephone Encounter (Signed)
This RN contacted pt per her VM stating concern due to " knot present on my left hip ".  Sonia Hill states she noticed above post her faslodex injections last week but " thought it was just maybe the medication "  " but now it's been a week later and the knot is still there "  Return call number given as [phone].  Upon call obtained pt's identified VM - message left stating could be relating to the shot itself not just the medication. Left advice regarding cool compress and use of cortisone cream to site and for pt to contact this RN on Monday.

## 2016-06-17 ENCOUNTER — Ambulatory Visit (HOSPITAL_BASED_OUTPATIENT_CLINIC_OR_DEPARTMENT_OTHER): Payer: 59

## 2016-06-17 ENCOUNTER — Ambulatory Visit (HOSPITAL_BASED_OUTPATIENT_CLINIC_OR_DEPARTMENT_OTHER): Payer: 59 | Admitting: Oncology

## 2016-06-17 ENCOUNTER — Other Ambulatory Visit (HOSPITAL_BASED_OUTPATIENT_CLINIC_OR_DEPARTMENT_OTHER): Payer: 59

## 2016-06-17 VITALS — BP 108/56 | HR 72 | Temp 97.5°F | Resp 18 | Ht 61.0 in | Wt 112.7 lb

## 2016-06-17 DIAGNOSIS — Z170 Estrogen receptor positive status [ER+]: Secondary | ICD-10-CM | POA: Diagnosis not present

## 2016-06-17 DIAGNOSIS — C50912 Malignant neoplasm of unspecified site of left female breast: Secondary | ICD-10-CM

## 2016-06-17 DIAGNOSIS — M810 Age-related osteoporosis without current pathological fracture: Secondary | ICD-10-CM

## 2016-06-17 DIAGNOSIS — C7951 Secondary malignant neoplasm of bone: Secondary | ICD-10-CM

## 2016-06-17 DIAGNOSIS — Z1379 Encounter for other screening for genetic and chromosomal anomalies: Secondary | ICD-10-CM

## 2016-06-17 DIAGNOSIS — C50312 Malignant neoplasm of lower-inner quadrant of left female breast: Secondary | ICD-10-CM

## 2016-06-17 DIAGNOSIS — Z5111 Encounter for antineoplastic chemotherapy: Secondary | ICD-10-CM | POA: Diagnosis not present

## 2016-06-17 DIAGNOSIS — R58 Hemorrhage, not elsewhere classified: Secondary | ICD-10-CM

## 2016-06-17 LAB — CBC WITH DIFFERENTIAL/PLATELET
BASO%: 0.6 % (ref 0.0–2.0)
BASOS ABS: 0 10*3/uL (ref 0.0–0.1)
EOS%: 1.5 % (ref 0.0–7.0)
Eosinophils Absolute: 0.1 10*3/uL (ref 0.0–0.5)
HCT: 35.4 % (ref 34.8–46.6)
HGB: 12.6 g/dL (ref 11.6–15.9)
LYMPH%: 23.2 % (ref 14.0–49.7)
MCH: 34.1 pg — AB (ref 25.1–34.0)
MCHC: 35.6 g/dL (ref 31.5–36.0)
MCV: 95.7 fL (ref 79.5–101.0)
MONO#: 0.2 10*3/uL (ref 0.1–0.9)
MONO%: 4.9 % (ref 0.0–14.0)
NEUT#: 2.3 10*3/uL (ref 1.5–6.5)
NEUT%: 69.8 % (ref 38.4–76.8)
Platelets: 283 10*3/uL (ref 145–400)
RBC: 3.7 10*6/uL (ref 3.70–5.45)
RDW: 12.6 % (ref 11.2–14.5)
WBC: 3.3 10*3/uL — ABNORMAL LOW (ref 3.9–10.3)
lymph#: 0.8 10*3/uL — ABNORMAL LOW (ref 0.9–3.3)

## 2016-06-17 LAB — COMPREHENSIVE METABOLIC PANEL
ALT: 69 U/L — ABNORMAL HIGH (ref 0–55)
AST: 43 U/L — ABNORMAL HIGH (ref 5–34)
Albumin: 4.1 g/dL (ref 3.5–5.0)
Alkaline Phosphatase: 89 U/L (ref 40–150)
Anion Gap: 7 mEq/L (ref 3–11)
BUN: 18.2 mg/dL (ref 7.0–26.0)
CHLORIDE: 101 meq/L (ref 98–109)
CO2: 28 meq/L (ref 22–29)
Calcium: 10.1 mg/dL (ref 8.4–10.4)
Creatinine: 1.1 mg/dL (ref 0.6–1.1)
EGFR: 55 mL/min/{1.73_m2} — ABNORMAL LOW (ref >90)
GLUCOSE: 77 mg/dL (ref 70–140)
POTASSIUM: 4.5 meq/L (ref 3.5–5.1)
SODIUM: 136 meq/L (ref 136–145)
Total Bilirubin: 0.64 mg/dL (ref 0.20–1.20)
Total Protein: 7.2 g/dL (ref 6.4–8.3)

## 2016-06-17 LAB — CEA (IN HOUSE-CHCC): CEA (CHCC-In House): 21.94 ng/mL — ABNORMAL HIGH (ref 0.00–5.00)

## 2016-06-17 MED ORDER — FULVESTRANT 250 MG/5ML IM SOLN
500.0000 mg | Freq: Once | INTRAMUSCULAR | Status: AC
  Administered 2016-06-17: 500 mg via INTRAMUSCULAR
  Filled 2016-06-17: qty 10

## 2016-06-17 NOTE — Progress Notes (Signed)
ID: Sonia Hill   DOB: 11/13/1955  MR#: [id number]  OZY#:[id number]  Patient Care Team: [name], MD as PCP - General (Family Medicine) Chauncey Cruel, MD as Consulting Physician (Oncology) Molli Posey, MD as Consulting Physician (Obstetrics and Gynecology) Laurence Spates, MD as Consulting Physician (Gastroenterology) Leighton Ruff, MD as Consulting Physician (General Surgery) Delrae Rend, MD as Consulting Physician (Endocrinology) Laureen Abrahams, RN as Registered Nurse (Oncology)   CHIEF COMPLAINT: Metastatic breast cancer, estrogen receptor positive  CURRENT TREATMENT: fulvestrant, palbociclib.  INTERVAL HISTORY: Sonia Hill returns for followup of her stage IV, estrogen receptor positive breast cancer. She is being treated with fulvestrant and palbociclib. She dropped in a few days ago because she had developed a "knot in my butt" from the shots. She was found to have a 1 cm by half a centimeter area of induration in the fat of her right buttock where she apparently had a shot previously. This was felt to be consistent with scar tissue as it was no erythema or tenderness.  She is tolerating the shots well otherwise. She denies unusual fatigue or nausea from the palbociclib and her counts have been holding up well.  REVIEW OF SYSTEMS: Sonia Hill has seen bright red blood in her stool at tissues twice in the last month. She does have a history of hemorrhoids and is occasionally constipated. She uses stool softeners and MiraLAX intermittently. However of course the main site of her metastatic disease is the rectum and it is difficult to tell which is the cause. She says that her bowel movements are much lower in caliber now than they were a year ago, but no different for the past several months. She doesn't have pain with defecation. She has had a slight increase in the number of bowel movements daily as compared to her remote baseline. Aside from these issues she bruises easily and she has  some back and joint pain which is not more intense or persistent than before. A detailed review of systems today was otherwise stable  BREAST CANCER HISTORY:  From the initial intake note:  Sonia Hill had screening mammography on 11/10/2009 showing very dense tissue with a possible distortion in the left breast.  Digital mammography on June 10th showed a 1.5 cm cluster of microcalcifications in the lower inner portion of the breast.  On physical exam, there was no palpable mass and no palpable axillary adenopathy.  There was some nipple inversion inferolaterally.  Ultrasound showed a 2.6 cm irregular mass-like area with dense posterior acoustic shadowing in the 3 o'clock position, 1 cm from the nipple, but normal appearing left axillary lymph nodes.    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  A second mass noted on ultrasound was also lobular, also low grade.  Both were ER and PR positive at well over 90%.  Both had low proliferation markers at 9%, and both were Her-2 negative by CISH with ratios of 1.6 and 1.24.  In short, both masses were nearly identical.    On June 16th, the patient had bilateral breast MRIs which showed in the left breast at 3 o'clock an irregular area of abnormal enhancement measuring up to 2.7 cm and in the left lower inner quadrant, a second post-biopsy area measuring 1.5 cm.  The difference between these two areas was stated at the conference the morning of the visit to be  approximately 5 cm.  Given this data the patient opted for Left mastectomy with sentinel lymph node sampling, performed July 2011 with results as detailed below.  Subsequent history is as detailed below.    PAST MEDICAL HISTORY: Past Medical History:  Diagnosis Date  . Arthritis    neck, upper back, shoulders - no meds - yoga  . Chronic kidney disease     Only has right kidney  . Depression    Hx - no current problem  . GERD (gastroesophageal reflux disease)    diet controlled - no meds  . Hyperlipidemia    diet controlled  . Hypothyroidism   . lt breast ca dx'd 11/2009  . Osteoporosis, unspecified 12/06/2013  . PONV (postoperative nausea and vomiting)   . SVD (spontaneous vaginal delivery)    x 1  1. Osteopenia. 2. Congenital left renal atrophy.  3. History of recurrent UTIs. 4. History of bladder reconstruction for reflux more than 18 years ago. 5. History of depression and anxiety. 6. History of hypothyroidism.  7. History of "sling procedure".  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    FAMILY HISTORY The patient's father is alive in his early 32s.  He has Alzheimer's disease. The patient's mother was diagnosed with breast cancer at the age of 43.  She died at the age of 59.  The patient's father had one out of two sisters with breast cancer.  There are other breast cancers on the mother's side.  The patient has one brother and one younger sister, neither with cancer.    GYNECOLOGIC HISTORY:  (Reviewed 12/06/2013) She is GX P1.  That pregnancy was at age 88.  Menarche was at age 25.  She was perimenopausal at the time of diagnosis but is now convincingly postmenopausal  SOCIAL HISTORY:  (Reviewed 12/06/2013) She works as a Electrical engineer, part-time and on consultation.  Her husband, [name], is a Architect at [employer].  [name] works as a Pharmacist, hospital in [place].  The patient's son Sonia Hill is  in college.  The patient attends [employer].   ADVANCED DIRECTIVES: in place  HEALTH MAINTENANCE: (Updated 12/06/2013)  Social History  Substance Use Topics  . Smoking status: Never Smoker  . Smokeless tobacco: Never Used  . Alcohol use No     Colonoscopy: 06/18/2015; [facility]  PAP: UTD/May 2015, Dr. Matthew Saras  Bone density: [name] 2014,  osteoporosis  Lipid panel: UTD (Not on file)    Allergies  Allergen Reactions  . Adhesive [Tape] Rash    Ok to use paper tape and tegaderm over IV site  . Betadine [Povidone Iodine] Rash  . Mercury Rash    Reaction mercurachrome  . Sulfa Antibiotics Rash    Current Outpatient Prescriptions  Medication Sig Dispense Refill  . ascorbic acid (VITAMIN C) 1000 MG tablet Take 1,000 mg by mouth 3 (three) times daily.     . Ashwagandha 125 MG CAPS Take 125 mg by mouth daily.     . Cholecalciferol (VITAMIN D-3) 5000 UNITS TABS Take 5,000 Units by mouth daily.     Marland Kitchen docusate sodium (COLACE) 250 MG capsule Take 1 capsule (250 mg total) by mouth daily. 10 capsule 0  . FOLIC ACID PO Take 8,588 mcg by mouth daily.     Leslee Home 100 MG capsule TAKE ONE CAPSULE (100 MG) BY MOUTH DAILY WITH FOOD FOR 21 DAYS OF A 28DAY CYCLE.  SWALLOW WHOLE.  DO NOT TAKE IF CAPSULES ARE BROKEN OR CRA 21 capsule 6  . MAGNESIUM PO Take 325 mg by mouth at bedtime as needed (leg cramps). Magnesium calm - powder    . Multiple Vitamin (MULTIVITAMIN WITH MINERALS) TABS tablet Take 1 tablet by mouth daily.    . Multiple Vitamins-Minerals (HAIR/SKIN/NAILS/BIOTIN) TABS Take 1 tablet by mouth daily.    . niacin 250 MG tablet Take 250 mg by mouth at bedtime.    . Omega-3 Fatty Acids (FISH OIL) 1200 MG CAPS Take 1,200 mg by mouth 2 (two) times daily.    Marland Kitchen OVER THE COUNTER MEDICATION Take 12.5 mg by mouth daily. Iodoral Iodine  supplement    . Probiotic Product (PROBIOTIC DAILY PO) Take 1 tablet by mouth daily.     Marland Kitchen thyroid (NATURE-THROID) 65 MG tablet Take 81.25 mg by mouth daily.     No current facility-administered medications for this visit.     OBJECTIVE: Middle aged white woman In no acute distress Vitals:   06/17/16 1105  BP: (!) 108/56  Pulse: 72  Resp: 18  Temp: 97.5 F (36.4 C)     Body mass index is 21.29 kg/m.    ECOG FS: 1 Filed Weights   06/17/16 1105  Weight: 112 lb 11.2 oz (51.1 kg)   Sclerae  unicteric, EOMs intact Oropharynx clear and moist No cervical or supraclavicular adenopathy Lungs no rales or rhonchi Heart regular rate and rhythm Abd soft, nontender, positive bowel sounds MSK no focal spinal tenderness, no upper extremity lymphedema Neuro: nonfocal, well oriented, appropriate affect Breasts: The right breast is unremarkable. The left breast is status post mastectomy. There is no evidence of local recurrence. The left axilla is benign.  LAB RESULTS:   Lab Results  Component Value Date   WBC 3.3 (L) 06/17/2016   NEUTROABS 2.3 06/17/2016   HGB 12.6 06/17/2016   HCT 35.4 06/17/2016   MCV 95.7 06/17/2016   PLT 283 06/17/2016      Chemistry      Component Value Date/Time   NA 136 05/13/2016 1010   K 4.6 05/13/2016 1010   CL 98 12/03/2012 1419   CO2 27 05/13/2016 1010   BUN 15.2 05/13/2016 1010   CREATININE 0.8 05/13/2016 1010      Component Value Date/Time   CALCIUM 9.9 05/13/2016 1010   ALKPHOS 86 05/13/2016 1010   AST 30 05/13/2016 1010   ALT 32 05/13/2016 1010   BILITOT 0.43 05/13/2016 1010      Lab Results  Component Value Date   LABCA2 181 (H) 10/23/2015   STUDIES: No results found.   ASSESSMENT: 61 y.o.  BRCA 1-2 negative [facility] woman  (1) status post left mastectomy and sentinel lymph node sampling in July 2011 for a lower inner quadrant T1 N1 M1, stage IV invasive lobular breast cancer, grade 1, strongly estrogen and progesterone receptor-positive, HER2 negative with MIB-1 of 9% and no HER2 amplification,   (2) with multiple sclerotic bone lesions at presentation seen only on CT scan (not on bone scan or PET scan), but  with biopsy-proven metastatic disease to bone; and an elevated CA 27.29 at presentation,   (3) Oncotype recurrence score of 4, predicts a good response to antiestrogens.  (4) Systemic treatment has consisted of  a) tamoxifen with evidence of response but poor tolerance  b) letrozole starting August 2012, discontinued  October 2014 per patient   (5) single functioning kidney  (6) status post bilateral salpingo-oophorectomy 01/24/2013, with benign pathology  (  7) osteoporosis; the patient refuses bisphosphonate therapy; started [facility] 2014.    (a) bone density was obtained under the care of Dr. Matthew Saras at [facility], 06/25/2012, showing osteoporosis with a T score -2.6. This was repeated 12/20/2013, showing again osteoporosis with T-scores between -2.4 and -2.8.  (b) the patient refuses zolendronate, denosumab, or other pharmacologic intervention  (8) the patient refuses standard Mammography or tomography; undergoing thermography screening of the right breast.  (9) PET scan 06/05/2015 shows rectal thickening and a presacral mass; biopsy of this area 10/09/2015 confirms metastatic lobular breast cancer, again estrogen receptor positive, HER-2 nonamplified.  (a) pelvic MRI 03/11/2016 confirms stability of disease.  (10) started fulvestrant and palbociclib 125 mg/ day [21/7] May 2017  (a) palbociclib dose decreased to 100 mg daily [21/7] with second cycle, started 11/25/2015   PLAN:  I think Vieno is stable as far as her disease is concerned. We spent a little over 30 minutes going over her concerns regarding the "knot" from the fulvestrant shots and the bright red blood per rectum noted on a couple of occasions in the last month. She understands the indurated area is likely scar tissue. This does not have to recur but it may well develop from other shots. This is not dangerous to her. We are continuing the fulvestrant shots as before.  It is not impossible that we are dealing with intermittent bleeding from her rectal metastasis, but in the absence of any other significant change and with the fact that this is very inconstant, I think what we are seeing is more likely hemorrhoidal. If the frequency of bleeding increases or if she develops pain with defecation I will send her  back to Dr. Marcello Moores for repeat proctoscopy.  We also reviewed her tumor markers, which show no clear trend and are consistent with stable disease.  Staging of Briget has been difficult in the past because of insurance issues. Apparently she has just changed her insurance. After much discussion today we decided we would obtain a chest CT and a pelvic MRI in March. She will see me again shortly after that to discuss results.  If we document progression. Likely we will move to exemestane/everolimus as the next treatment protocol.  She knows to call for any problems that may develop before her next visit here.      Chauncey Cruel, MD 06/17/2016

## 2016-06-17 NOTE — Patient Instructions (Signed)

## 2016-06-18 LAB — CANCER ANTIGEN 27.29: CAN 27.29: 120.5 U/mL — AB (ref 0.0–38.6)

## 2016-07-13 ENCOUNTER — Telehealth: Payer: Self-pay

## 2016-07-13 NOTE — Telephone Encounter (Signed)
Pt called stating she has tried to call scheduling since Monday. She wishes to change her lab/injection time on Friday to earlier in the day. In basket sent

## 2016-07-14 ENCOUNTER — Telehealth: Payer: Self-pay | Admitting: Oncology

## 2016-07-14 NOTE — Telephone Encounter (Signed)
Patient called and has been trying to get a schedule change and has been unsuccessful and would like to see if she can be moved up in the day tomorrow for lab and injections.  She can be reached today on her cell [phone]

## 2016-07-15 ENCOUNTER — Ambulatory Visit (HOSPITAL_BASED_OUTPATIENT_CLINIC_OR_DEPARTMENT_OTHER): Payer: 59

## 2016-07-15 ENCOUNTER — Other Ambulatory Visit (HOSPITAL_BASED_OUTPATIENT_CLINIC_OR_DEPARTMENT_OTHER): Payer: 59

## 2016-07-15 VITALS — BP 112/83 | HR 78 | Temp 97.5°F | Resp 18

## 2016-07-15 DIAGNOSIS — M810 Age-related osteoporosis without current pathological fracture: Secondary | ICD-10-CM

## 2016-07-15 DIAGNOSIS — Z1379 Encounter for other screening for genetic and chromosomal anomalies: Secondary | ICD-10-CM

## 2016-07-15 DIAGNOSIS — C50312 Malignant neoplasm of lower-inner quadrant of left female breast: Secondary | ICD-10-CM

## 2016-07-15 DIAGNOSIS — C7951 Secondary malignant neoplasm of bone: Secondary | ICD-10-CM

## 2016-07-15 DIAGNOSIS — C50912 Malignant neoplasm of unspecified site of left female breast: Secondary | ICD-10-CM

## 2016-07-15 DIAGNOSIS — Z5111 Encounter for antineoplastic chemotherapy: Secondary | ICD-10-CM | POA: Diagnosis not present

## 2016-07-15 DIAGNOSIS — Z170 Estrogen receptor positive status [ER+]: Secondary | ICD-10-CM

## 2016-07-15 LAB — CBC WITH DIFFERENTIAL/PLATELET
BASO%: 1.1 % (ref 0.0–2.0)
BASOS ABS: 0 10*3/uL (ref 0.0–0.1)
EOS ABS: 0.1 10*3/uL (ref 0.0–0.5)
EOS%: 1.6 % (ref 0.0–7.0)
HEMATOCRIT: 38.1 % (ref 34.8–46.6)
HEMOGLOBIN: 13.1 g/dL (ref 11.6–15.9)
LYMPH%: 19 % (ref 14.0–49.7)
MCH: 33.9 pg (ref 25.1–34.0)
MCHC: 34.4 g/dL (ref 31.5–36.0)
MCV: 98.6 fL (ref 79.5–101.0)
MONO#: 0.2 10*3/uL (ref 0.1–0.9)
MONO%: 5.5 % (ref 0.0–14.0)
NEUT%: 72.8 % (ref 38.4–76.8)
NEUTROS ABS: 2.7 10*3/uL (ref 1.5–6.5)
PLATELETS: 260 10*3/uL (ref 145–400)
RBC: 3.87 10*6/uL (ref 3.70–5.45)
RDW: 13.4 % (ref 11.2–14.5)
WBC: 3.6 10*3/uL — AB (ref 3.9–10.3)
lymph#: 0.7 10*3/uL — ABNORMAL LOW (ref 0.9–3.3)

## 2016-07-15 LAB — COMPREHENSIVE METABOLIC PANEL
ALBUMIN: 4 g/dL (ref 3.5–5.0)
ALT: 69 U/L — ABNORMAL HIGH (ref 0–55)
ANION GAP: 8 meq/L (ref 3–11)
AST: 48 U/L — AB (ref 5–34)
Alkaline Phosphatase: 93 U/L (ref 40–150)
BILIRUBIN TOTAL: 0.67 mg/dL (ref 0.20–1.20)
BUN: 12.1 mg/dL (ref 7.0–26.0)
CALCIUM: 10.5 mg/dL — AB (ref 8.4–10.4)
CO2: 28 mEq/L (ref 22–29)
CREATININE: 0.9 mg/dL (ref 0.6–1.1)
Chloride: 101 mEq/L (ref 98–109)
EGFR: 66 mL/min/{1.73_m2} — ABNORMAL LOW (ref >90)
Glucose: 69 mg/dl — ABNORMAL LOW (ref 70–140)
Potassium: 4.8 mEq/L (ref 3.5–5.1)
Sodium: 137 mEq/L (ref 136–145)
TOTAL PROTEIN: 7.2 g/dL (ref 6.4–8.3)

## 2016-07-15 LAB — CEA (IN HOUSE-CHCC): CEA (CHCC-IN HOUSE): 23.75 ng/mL — AB (ref 0.00–5.00)

## 2016-07-15 MED ORDER — FULVESTRANT 250 MG/5ML IM SOLN
500.0000 mg | Freq: Once | INTRAMUSCULAR | Status: AC
Start: 2016-07-15 — End: 2016-07-15
  Administered 2016-07-15: 500 mg via INTRAMUSCULAR
  Filled 2016-07-15: qty 10

## 2016-07-15 NOTE — Patient Instructions (Signed)

## 2016-07-16 LAB — CANCER ANTIGEN 27.29: CA 27.29: 129.1 U/mL — ABNORMAL HIGH (ref 0.0–38.6)

## 2016-07-22 ENCOUNTER — Other Ambulatory Visit: Payer: Self-pay | Admitting: Oncology

## 2016-08-01 ENCOUNTER — Ambulatory Visit (INDEPENDENT_AMBULATORY_CARE_PROVIDER_SITE_OTHER): Payer: 59 | Admitting: Podiatry

## 2016-08-01 ENCOUNTER — Encounter: Payer: Self-pay | Admitting: Podiatry

## 2016-08-01 VITALS — BP 88/62 | HR 81 | Resp 16

## 2016-08-01 DIAGNOSIS — M779 Enthesopathy, unspecified: Secondary | ICD-10-CM | POA: Diagnosis not present

## 2016-08-01 DIAGNOSIS — L600 Ingrowing nail: Secondary | ICD-10-CM | POA: Diagnosis not present

## 2016-08-01 DIAGNOSIS — L84 Corns and callosities: Secondary | ICD-10-CM

## 2016-08-01 MED ORDER — TRIAMCINOLONE ACETONIDE 10 MG/ML IJ SUSP
10.0000 mg | Freq: Once | INTRAMUSCULAR | Status: AC
  Administered 2016-08-01: 10 mg

## 2016-08-01 NOTE — Progress Notes (Signed)
Subjective:     Patient ID: Sonia Hill, female   DOB: [birth year], 61 y.o.   MRN: [id number]  HPI patient states she has a lot of pain between the big toe and second toe right foot with inflammation and pain when palpated around the second digit and around the ingrown toenail right hallux lateral border   Review of Systems     Objective:   Physical Exam Neurovascular status with no change in health history with patient being treated for metastatic cancer which is currently stable. Patient has inflammation and fluid around the interphalangeal joint right second toe medial side with keratotic lesion formation and chronic ingrown toenail the right big toe lateral side that's painful when pressed    Assessment:     Inflammatory capsulitis with digital deformity second digit right and ingrown toenail deformity right hallux lateral border    Plan:     H&P x-rays reviewed and at this point I have recommended correction of the ingrown toenail and the inflammatory hammertoe digital deformity second right. I reviewed structural deformity present and today we'll focus on the acute inflammation and I did go ahead did a proximal nerve block of the hallux and second toe and I injected the interphalangeal joint of the right second digit 2 mg Dexon some Kenalog 2 mill grams Xylocaine and applied padding and for the hallux I removed the lateral border under sterile conditions exposed matrix and applied phenol 3 applications 30 seconds followed by alcohol lavage and sterile dressing. Gave instructions on soaks and reappoint

## 2016-08-01 NOTE — Patient Instructions (Signed)

## 2016-08-05 ENCOUNTER — Telehealth: Payer: Self-pay | Admitting: *Deleted

## 2016-08-05 NOTE — Telephone Encounter (Signed)
Pt states had procedure 02/192018 with Dr. Paulla Dolly and wanted to know how long to continue soaking and if she needed to cover the corn he shaved on the 2nd toe. I told pt she would need to soak for 4-6 weeks until the areas got a dry hard scab without redness, drainage or swelling. I told pt at about the end of the 4th week could do the last soak of the day and allow the area to air dry, if the area did get a dry hard scab without redness, drainage or swelling she could stop the soaks, but if not to continue another 2 week and try the test again. I told pt it would probably be a good idea to cover the 2nd toe with a bandaid to keep it from rubbing the rough bandaid on the 1st toe.

## 2016-08-12 ENCOUNTER — Ambulatory Visit (HOSPITAL_BASED_OUTPATIENT_CLINIC_OR_DEPARTMENT_OTHER): Payer: 59

## 2016-08-12 ENCOUNTER — Other Ambulatory Visit (HOSPITAL_BASED_OUTPATIENT_CLINIC_OR_DEPARTMENT_OTHER): Payer: 59

## 2016-08-12 ENCOUNTER — Telehealth: Payer: Self-pay | Admitting: Oncology

## 2016-08-12 VITALS — BP 129/70 | HR 77 | Temp 98.5°F | Resp 16

## 2016-08-12 DIAGNOSIS — M810 Age-related osteoporosis without current pathological fracture: Secondary | ICD-10-CM

## 2016-08-12 DIAGNOSIS — Z5111 Encounter for antineoplastic chemotherapy: Secondary | ICD-10-CM

## 2016-08-12 DIAGNOSIS — C7951 Secondary malignant neoplasm of bone: Secondary | ICD-10-CM

## 2016-08-12 DIAGNOSIS — C50312 Malignant neoplasm of lower-inner quadrant of left female breast: Secondary | ICD-10-CM

## 2016-08-12 DIAGNOSIS — Z1379 Encounter for other screening for genetic and chromosomal anomalies: Secondary | ICD-10-CM

## 2016-08-12 DIAGNOSIS — C50912 Malignant neoplasm of unspecified site of left female breast: Secondary | ICD-10-CM

## 2016-08-12 DIAGNOSIS — Z170 Estrogen receptor positive status [ER+]: Secondary | ICD-10-CM

## 2016-08-12 LAB — CBC WITH DIFFERENTIAL/PLATELET
BASO%: 0.9 % (ref 0.0–2.0)
Basophils Absolute: 0 10*3/uL (ref 0.0–0.1)
EOS%: 1.2 % (ref 0.0–7.0)
Eosinophils Absolute: 0 10*3/uL (ref 0.0–0.5)
HEMATOCRIT: 36.9 % (ref 34.8–46.6)
HGB: 13 g/dL (ref 11.6–15.9)
LYMPH#: 0.6 10*3/uL — AB (ref 0.9–3.3)
LYMPH%: 19.5 % (ref 14.0–49.7)
MCH: 33.3 pg (ref 25.1–34.0)
MCHC: 35.2 g/dL (ref 31.5–36.0)
MCV: 94.6 fL (ref 79.5–101.0)
MONO#: 0.2 10*3/uL (ref 0.1–0.9)
MONO%: 6.1 % (ref 0.0–14.0)
NEUT#: 2.4 10*3/uL (ref 1.5–6.5)
NEUT%: 72.3 % (ref 38.4–76.8)
Platelets: 231 10*3/uL (ref 145–400)
RBC: 3.9 10*6/uL (ref 3.70–5.45)
RDW: 13.1 % (ref 11.2–14.5)
WBC: 3.3 10*3/uL — ABNORMAL LOW (ref 3.9–10.3)

## 2016-08-12 LAB — COMPREHENSIVE METABOLIC PANEL
ALBUMIN: 3.9 g/dL (ref 3.5–5.0)
ALK PHOS: 81 U/L (ref 40–150)
ALT: 63 U/L — AB (ref 0–55)
AST: 42 U/L — AB (ref 5–34)
Anion Gap: 9 mEq/L (ref 3–11)
BUN: 16.3 mg/dL (ref 7.0–26.0)
CALCIUM: 10 mg/dL (ref 8.4–10.4)
CO2: 26 mEq/L (ref 22–29)
CREATININE: 0.9 mg/dL (ref 0.6–1.1)
Chloride: 104 mEq/L (ref 98–109)
EGFR: 66 mL/min/{1.73_m2} — ABNORMAL LOW (ref >90)
GLUCOSE: 60 mg/dL — AB (ref 70–140)
Potassium: 4.6 mEq/L (ref 3.5–5.1)
Sodium: 139 mEq/L (ref 136–145)
Total Bilirubin: 0.56 mg/dL (ref 0.20–1.20)
Total Protein: 6.9 g/dL (ref 6.4–8.3)

## 2016-08-12 LAB — CEA (IN HOUSE-CHCC): CEA (CHCC-IN HOUSE): 29.42 ng/mL — AB (ref 0.00–5.00)

## 2016-08-12 MED ORDER — FULVESTRANT 250 MG/5ML IM SOLN
500.0000 mg | Freq: Once | INTRAMUSCULAR | Status: AC
  Administered 2016-08-12: 500 mg via INTRAMUSCULAR
  Filled 2016-08-12: qty 10

## 2016-08-12 NOTE — Telephone Encounter (Signed)
lvm to inform pt of iv star and lab appt 3/23 prior to MRI at 0900

## 2016-08-12 NOTE — Patient Instructions (Signed)

## 2016-08-13 LAB — CANCER ANTIGEN 27.29: CA 27.29: 131.4 U/mL — ABNORMAL HIGH (ref 0.0–38.6)

## 2016-08-17 ENCOUNTER — Telehealth: Payer: Self-pay | Admitting: *Deleted

## 2016-08-17 NOTE — Telephone Encounter (Signed)
"  I'm trying to find out what's going on with the appointments.  I usually have scans with contrast but it reads without on Epic."    Advised test are with and without.  Only liquids four hours before.  Arrive at [facility] at 8:45 for lab draw.  No further questions.

## 2016-09-02 ENCOUNTER — Ambulatory Visit (HOSPITAL_COMMUNITY)
Admission: RE | Admit: 2016-09-02 | Discharge: 2016-09-02 | Disposition: A | Discharge status: Home / Self Care | Payer: 59 | Source: Ambulatory Visit | Attending: Oncology | Admitting: Oncology

## 2016-09-02 ENCOUNTER — Ambulatory Visit (HOSPITAL_COMMUNITY): Payer: 59

## 2016-09-02 ENCOUNTER — Other Ambulatory Visit: Payer: 59

## 2016-09-02 ENCOUNTER — Other Ambulatory Visit (HOSPITAL_BASED_OUTPATIENT_CLINIC_OR_DEPARTMENT_OTHER): Payer: 59

## 2016-09-02 ENCOUNTER — Ambulatory Visit (HOSPITAL_BASED_OUTPATIENT_CLINIC_OR_DEPARTMENT_OTHER): Payer: 59

## 2016-09-02 DIAGNOSIS — C7951 Secondary malignant neoplasm of bone: Principal | ICD-10-CM

## 2016-09-02 DIAGNOSIS — C50912 Malignant neoplasm of unspecified site of left female breast: Secondary | ICD-10-CM

## 2016-09-02 DIAGNOSIS — Z170 Estrogen receptor positive status [ER+]: Secondary | ICD-10-CM

## 2016-09-02 DIAGNOSIS — M810 Age-related osteoporosis without current pathological fracture: Secondary | ICD-10-CM

## 2016-09-02 DIAGNOSIS — C50312 Malignant neoplasm of lower-inner quadrant of left female breast: Secondary | ICD-10-CM

## 2016-09-02 LAB — CEA (IN HOUSE-CHCC): CEA (CHCC-IN HOUSE): 31.25 ng/mL — AB (ref 0.00–5.00)

## 2016-09-02 LAB — CBC WITH DIFFERENTIAL/PLATELET
BASO%: 0.6 % (ref 0.0–2.0)
BASOS ABS: 0 10*3/uL (ref 0.0–0.1)
EOS%: 0.5 % (ref 0.0–7.0)
Eosinophils Absolute: 0 10*3/uL (ref 0.0–0.5)
HEMATOCRIT: 36.3 % (ref 34.8–46.6)
HEMOGLOBIN: 12.8 g/dL (ref 11.6–15.9)
LYMPH#: 1 10*3/uL (ref 0.9–3.3)
LYMPH%: 19.3 % (ref 14.0–49.7)
MCH: 34.2 pg — AB (ref 25.1–34.0)
MCHC: 35.2 g/dL (ref 31.5–36.0)
MCV: 97.2 fL (ref 79.5–101.0)
MONO#: 0.2 10*3/uL (ref 0.1–0.9)
MONO%: 4.8 % (ref 0.0–14.0)
NEUT#: 3.7 10*3/uL (ref 1.5–6.5)
NEUT%: 74.8 % (ref 38.4–76.8)
Platelets: 244 10*3/uL (ref 145–400)
RBC: 3.73 10*6/uL (ref 3.70–5.45)
RDW: 13.8 % (ref 11.2–14.5)
WBC: 5 10*3/uL (ref 3.9–10.3)

## 2016-09-02 LAB — COMPREHENSIVE METABOLIC PANEL
ALT: 40 U/L (ref 0–55)
ANION GAP: 9 meq/L (ref 3–11)
AST: 29 U/L (ref 5–34)
Albumin: 4 g/dL (ref 3.5–5.0)
Alkaline Phosphatase: 80 U/L (ref 40–150)
BUN: 13.4 mg/dL (ref 7.0–26.0)
CHLORIDE: 100 meq/L (ref 98–109)
CO2: 25 meq/L (ref 22–29)
Calcium: 10.1 mg/dL (ref 8.4–10.4)
Creatinine: 0.9 mg/dL (ref 0.6–1.1)
EGFR: 71 mL/min/{1.73_m2} — AB (ref >90)
Glucose: 88 mg/dl (ref 70–140)
Potassium: 3.9 mEq/L (ref 3.5–5.1)
SODIUM: 134 meq/L — AB (ref 136–145)
TOTAL PROTEIN: 7.2 g/dL (ref 6.4–8.3)
Total Bilirubin: 0.67 mg/dL (ref 0.20–1.20)

## 2016-09-02 MED ORDER — GADOBENATE DIMEGLUMINE 529 MG/ML IV SOLN
10.0000 mL | Freq: Once | INTRAVENOUS | Status: AC | PRN
Start: 2016-09-02 — End: 2016-09-02
  Administered 2016-09-02: 10 mL via INTRAVENOUS

## 2016-09-02 MED ORDER — IOPAMIDOL (ISOVUE-300) INJECTION 61%
INTRAVENOUS | Status: AC
  Administered 2016-09-02: 75 mL
  Filled 2016-09-02: qty 75

## 2016-09-03 ENCOUNTER — Other Ambulatory Visit: Payer: Self-pay | Admitting: Oncology

## 2016-09-03 LAB — CANCER ANTIGEN 27.29: CAN 27.29: 132.7 U/mL — AB (ref 0.0–38.6)

## 2016-09-03 NOTE — Progress Notes (Unsigned)
ID: Sonia Hill   DOB: 08-Sonia Hill-1957  MR#: [id number]  EPP#:[id number]  Patient Care Team: Sonia Elbe, MD as PCP - General (Family Medicine) Sonia Cruel, MD as Consulting Physician (Oncology) Sonia Posey, MD as Consulting Physician (Obstetrics and Gynecology) Sonia Spates, MD as Consulting Physician (Gastroenterology) Sonia Ruff, MD as Consulting Physician (General Surgery) Sonia Rend, MD as Consulting Physician (Endocrinology) Sonia Abrahams, RN as Registered Nurse (Oncology)   CHIEF COMPLAINT: Metastatic breast cancer, estrogen receptor positive  CURRENT TREATMENT: fulvestrant, palbociclib.  INTERVAL HISTORY: Sonia Hill returns for followup of her stage IV, estrogen receptor positive breast cancer. She is being treated with fulvestrant and palbociclib. She dropped in a few days ago because she had developed a "knot in my butt" from the shots. She was found to have a 1 cm by half a centimeter area of induration in the fat of her right buttock where she apparently had a shot previously. This was felt to be consistent with scar tissue as it was no erythema or tenderness.  She is tolerating the shots well otherwise. She denies unusual fatigue or nausea from the palbociclib and her counts have been holding up well.  REVIEW OF SYSTEMS: Sonia Hill has seen bright red blood in her stool at tissues twice in the last month. She does have a history of hemorrhoids and is occasionally constipated. She uses stool softeners and MiraLAX intermittently. However of course the main site of her metastatic disease is the rectum and it is difficult to tell which is the cause. She says that her bowel movements are much lower in caliber now than they were a year ago, but no different for the past several months. She doesn't have pain with defecation. She has had a slight increase in the number of bowel movements daily as compared to her remote baseline. Aside from these issues she bruises easily and she has  some back and joint pain which is not more intense or persistent than before. A detailed review of systems today was otherwise stable  BREAST CANCER HISTORY:  From the initial intake note:  Sonia Hill had screening mammography on 11/10/2009 showing very dense tissue with a possible distortion in the left breast.  Digital mammography on June 10th showed a 1.5 cm cluster of microcalcifications in the lower inner portion of the breast.  On physical exam, there was no palpable mass and no palpable axillary adenopathy.  There was some nipple inversion inferolaterally.  Ultrasound showed a 2.6 cm irregular mass-like area with dense posterior acoustic shadowing in the 3 o'clock position, 1 cm from the nipple, but normal appearing left axillary lymph nodes.    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  A second mass noted on ultrasound was also lobular, also low grade.  Both were ER and PR positive at well over 90%.  Both had low proliferation markers at 9%, and both were Her-2 negative by CISH with ratios of 1.6 and 1.24.  In short, both masses were nearly identical.    On June 16th, the patient had bilateral breast MRIs which showed in the left breast at 3 o'clock an irregular area of abnormal enhancement measuring up to 2.7 cm and in the left lower inner quadrant, a second post-biopsy area measuring 1.5 cm.  The difference between these two areas was stated at the conference the morning of the visit to be  approximately 5 cm.  Given this data the patient opted for Left mastectomy with sentinel lymph node sampling, performed July 2011 with results as detailed below.  Subsequent history is as detailed below.    PAST MEDICAL HISTORY: Past Medical History:  Diagnosis Date  . Arthritis    neck, upper back, shoulders - no meds - yoga  . Chronic kidney disease     Only has right kidney  . Depression    Hx - no current problem  . GERD (gastroesophageal reflux disease)    diet controlled - no meds  . Hyperlipidemia    diet controlled  . Hypothyroidism   . lt breast ca dx'd 11/2009  . Osteoporosis, unspecified 12/06/2013  . PONV (postoperative nausea and vomiting)   . SVD (spontaneous vaginal delivery)    x 1  1. Osteopenia. 2. Congenital left renal atrophy.  3. History of recurrent UTIs. 4. History of bladder reconstruction for reflux more than 18 years ago. 5. History of depression and anxiety. 6. History of hypothyroidism.  7. History of "sling procedure".  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    FAMILY HISTORY The patient's father is alive in his early 32s.  He has Alzheimer's disease. The patient's mother was diagnosed with breast cancer at the age of 43.  She died at the age of 59.  The patient's father had one out of two sisters with breast cancer.  There are other breast cancers on the mother's side.  The patient has one brother and one younger sister, neither with cancer.    GYNECOLOGIC HISTORY:  (Reviewed 12/06/2013) She is GX P1.  That pregnancy was at age 88.  Menarche was at age 25.  She was perimenopausal at the time of diagnosis but is now convincingly postmenopausal  SOCIAL HISTORY:  (Reviewed 12/06/2013) She works as a Electrical engineer, part-time and on consultation.  Her husband, Sonia Hill, is a Architect at [employer].  Sister Sonia Hill works as a Pharmacist, hospital in [place].  The patient's son Sonia Hill is  in college.  The patient attends [employer].   ADVANCED DIRECTIVES: in place  HEALTH MAINTENANCE: (Updated 12/06/2013)  Social History  Substance Use Topics  . Smoking status: Never Smoker  . Smokeless tobacco: Never Used  . Alcohol use No     Colonoscopy: 06/18/2015; [facility]  PAP: UTD/May 2015, Dr. Matthew Hill  Bone density: Sonia Hill 2014,  osteoporosis  Lipid panel: UTD (Not on file)    Allergies  Allergen Reactions  . Adhesive [Tape] Rash    Ok to use paper tape and tegaderm over IV site  . Betadine [Povidone Iodine] Rash  . Mercury Rash    Reaction mercurachrome  . Sulfa Antibiotics Rash    Current Outpatient Prescriptions  Medication Sig Dispense Refill  . ascorbic acid (VITAMIN C) 1000 MG tablet Take 1,000 mg by mouth 3 (three) times daily.     . Ashwagandha 125 MG CAPS Take 125 mg by mouth daily.     . Cholecalciferol (VITAMIN D-3) 5000 UNITS TABS Take 5,000 Units by mouth daily.     Marland Kitchen docusate sodium (COLACE) 250 MG capsule Take 1 capsule (250 mg total) by mouth daily. 10 capsule 0  . FOLIC ACID PO Take 8,588 mcg by mouth daily.     Leslee Home 100 MG capsule TAKE ONE CAPSULE (100 MG) BY MOUTH DAILY WITH FOOD FOR 21 DAYS OF A 28DAY CYCLE.  SWALLOW WHOLE.  DO NOT TAKE IF CAPSULES ARE BROKEN OR CRA 21 capsule 6  . MAGNESIUM PO Take 325 mg by mouth at bedtime as needed (leg cramps). Magnesium calm - powder    . Multiple Vitamin (MULTIVITAMIN WITH MINERALS) TABS tablet Take 1 tablet by mouth daily.    . Multiple Vitamins-Minerals (HAIR/SKIN/NAILS/BIOTIN) TABS Take 1 tablet by mouth daily.    . niacin 250 MG tablet Take 250 mg by mouth at bedtime.    . Omega-3 Fatty Acids (FISH OIL) 1200 MG CAPS Take 1,200 mg by mouth 2 (two) times daily.    Marland Kitchen OVER THE COUNTER MEDICATION Take 12.5 mg by mouth daily. Iodoral Iodine  supplement    . Probiotic Product (PROBIOTIC DAILY PO) Take 1 tablet by mouth daily.     Marland Kitchen thyroid (NATURE-THROID) 65 MG tablet Take 81.25 mg by mouth daily.     No current facility-administered medications for this visit.     OBJECTIVE: Middle aged white woman In no acute distress There were no vitals filed for this visit.   There is no height or weight on file to calculate BMI.    ECOG FS: 1 There were no vitals filed for this visit. Sclerae unicteric, EOMs intact Oropharynx clear and moist No  cervical or supraclavicular adenopathy Lungs no rales or rhonchi Heart regular rate and rhythm Abd soft, nontender, positive bowel sounds MSK no focal spinal tenderness, no upper extremity lymphedema Neuro: nonfocal, well oriented, appropriate affect Breasts: The right breast is unremarkable. The left breast is status post mastectomy. There is no evidence of local recurrence. The left axilla is benign.  LAB RESULTS:   Lab Results  Component Value Date   WBC 5.0 09/02/2016   NEUTROABS 3.7 09/02/2016   HGB 12.8 09/02/2016   HCT 36.3 09/02/2016   MCV 97.2 09/02/2016   PLT 244 09/02/2016      Chemistry      Component Value Date/Time   NA 134 (L) 09/02/2016 0908   K 3.9 09/02/2016 0908   CL 98 12/03/2012 1419   CO2 25 09/02/2016 0908   BUN 13.4 09/02/2016 0908   CREATININE 0.9 09/02/2016 0908      Component Value Date/Time   CALCIUM 10.1 09/02/2016 0908   ALKPHOS 80 09/02/2016 0908   AST 29 09/02/2016 0908   ALT 40 09/02/2016 0908   BILITOT 0.67 09/02/2016 0908      Lab Results  Component Value Date   LABCA2 181 (H) 10/23/2015   STUDIES: Ct Chest W Contrast  Result Date: 09/02/2016 CLINICAL DATA:  Metastatic breast cancer. EXAM: CT CHEST WITH CONTRAST TECHNIQUE: Multidetector CT imaging of the chest was performed during intravenous contrast administration. CONTRAST:  1 ISOVUE-300 IOPAMIDOL (ISOVUE-300) INJECTION 61% COMPARISON:  PET-CT 06/05/2015 FINDINGS: Cardiovascular: No significant vascular findings. Normal heart size. No pericardial effusion.Normal heart size. No pericardial effusion. Mediastinum/Nodes: There is a small nodule in the left upper lobe measuring 4 mm, image 16 of series 7. Unchanged from previous exam. Calcified granuloma is identified within the perifissural portion of the lingula. Lungs/Pleura: No pleural fluid. No atelectasis or airspace consolidation. Upper Abdomen: No acute abnormality. Musculoskeletal: No chest wall abnormality. No acute or  significant osseous findings. Status post left mastectomy and left axillary nodal dissection. IMPRESSION: 1. No acute cardiopulmonary abnormalities. 2. No findings to suggest residual tumor or metastatic disease. Electronically Signed   By: Kerby Moors M.D.   On: 09/02/2016 14:27   Mr Pelvis W Wo Contrast  Result Date: 09/02/2016 CLINICAL DATA:  Stage IV lobular breast carcinoma with bone metastasis and rectal/ presacral metastasis. EXAM: MRI PELVIS WITHOUT AND WITH CONTRAST TECHNIQUE: Multiplanar multisequence MR imaging of the pelvis was performed both before and after administration of intravenous contrast. CONTRAST:  74mL MULTIHANCE GADOBENATE DIMEGLUMINE 529 MG/ML IV SOLN COMPARISON:  Pelvic MRI [id number], PET-CT 06/05/2015 FINDINGS: Urinary Tract: Normal RIGHT kidney. Normal RIGHT ureter and bladder by MR imaging Bowel: Persistent circumferential thickening of the distal rectum measuring 9 mm in single wall thickness (image 22, series 6) compared to 9 mm on comparison MRI 03/03/2016. This thickening extends over approximately 6 cm segment leading up to the anal verge. No interval change from comparison exam. No evidence of extension of process beyond the serosal surface of the rectum. Persistent enhancement through this region on post-contrast exam. There is no abnormal enhancement or nodular thickening in the RIGHT presacral space at site of prior hypermetabolic tissue. Vascular/Lymphatic: No pathologically enlarged pelvic lymph nodes. Ovoid Fluid collection along the LEFT iliac vessels measuring 13 mm (image 6, series 6) is not changed from prior. This may represent a small lymphocele or seroma. Reproductive:  Uterus is small.  Ovaries not identified. Other: Smaller free fluid in the deep RIGHT pelvis similar to comparison exam. Musculoskeletal: No aggressive osseous lesion identified. IMPRESSION: 1. Persistent in circumferential rectal thickening not changed from comparison exam. No evidence of  extension beyond the serosal surface of this process. 2. No evidence of nodularity or abnormal enhancement presacral space to suggest metastatic disease tumor recurrence. 3. No pelvic lymphadenopathy. Electronically Signed   By: Genevive Bi M.D.   On: 09/02/2016 12:32     ASSESSMENT: 61 y.o.  BRCA 1-2 negative [facility] woman  (1) status post left mastectomy and sentinel lymph node sampling in July 2011 for a lower inner quadrant T1 N1 M1, stage IV invasive lobular breast cancer, grade 1, strongly estrogen and progesterone receptor-positive, HER2 negative with MIB-1 of 9% and no HER2 amplification,   (2) with multiple sclerotic bone lesions at presentation seen only on CT scan (not on bone scan or PET scan), but  with biopsy-proven metastatic disease to bone; and an elevated CA 27.29 at presentation,   (3) Oncotype recurrence score of 4, predicts a good response to antiestrogens.  (4) Systemic treatment has consisted of  a) tamoxifen with evidence of response but poor tolerance  b) letrozole starting August 2012, discontinued October 2014 per patient   (5) single functioning kidney  (6) status post bilateral salpingo-oophorectomy 01/24/2013, with benign pathology  (7) osteoporosis; the patient refuses bisphosphonate therapy; started [facility] February 2014.    (a) bone density was obtained under the care of Dr. Marcelle Overlie at [facility], 06/25/2012, showing osteoporosis with a T score -2.6. This was repeated 12/20/2013, showing again osteoporosis with T-scores between -2.4 and -2.8.  (b) the patient refuses zolendronate, denosumab, or other pharmacologic intervention  (8) the patient refuses standard Mammography or tomography; undergoing thermography screening of the right breast.  (9) PET scan 06/05/2015 shows rectal thickening and a presacral mass; biopsy of this area 10/09/2015 confirms metastatic lobular breast cancer, again estrogen receptor positive, HER-2  nonamplified.  (a) pelvic MRI 03/11/2016 confirms stability of disease.  (10) started fulvestrant and palbociclib 125 mg/ day [21/7] May 2017  (a) palbociclib dose decreased to 100 mg daily [21/7] with second cycle, started 11/25/2015   PLAN:  I think Audryanna is stable as far as her disease is concerned. We spent a little over 30 minutes going over  her concerns regarding the "knot" from the fulvestrant shots and the bright red blood per rectum noted on a couple of occasions in the last month. She understands the indurated area is likely scar tissue. This does not have to recur but it may well develop from other shots. This is not dangerous to her. We are continuing the fulvestrant shots as before.  It is not impossible that we are dealing with intermittent bleeding from her rectal metastasis, but in the absence of any other significant change and with the fact that this is very inconstant, I think what we are seeing is more likely hemorrhoidal. If the frequency of bleeding increases or if she develops pain with defecation I will send her back to Dr. Marcello Moores for repeat proctoscopy.  We also reviewed her tumor markers, which show no clear trend and are consistent with stable disease.  Staging of Leanne has been difficult in the past because of insurance issues. Apparently she has just changed her insurance. After much discussion today we decided we would obtain a chest CT and a pelvic MRI in March. She will see me again shortly after that to discuss results.  If we document progression. Likely we will move to exemestane/everolimus as the next treatment protocol.  She knows to call for any problems that may develop before her next visit here.      Sonia Cruel, MD 09/03/2016

## 2016-09-09 ENCOUNTER — Ambulatory Visit (HOSPITAL_BASED_OUTPATIENT_CLINIC_OR_DEPARTMENT_OTHER): Payer: 59

## 2016-09-09 ENCOUNTER — Ambulatory Visit (HOSPITAL_BASED_OUTPATIENT_CLINIC_OR_DEPARTMENT_OTHER): Payer: 59 | Admitting: Oncology

## 2016-09-09 VITALS — BP 149/68 | HR 83 | Temp 98.2°F | Resp 18 | Ht 61.0 in | Wt 111.0 lb

## 2016-09-09 DIAGNOSIS — C7951 Secondary malignant neoplasm of bone: Secondary | ICD-10-CM | POA: Diagnosis not present

## 2016-09-09 DIAGNOSIS — Z170 Estrogen receptor positive status [ER+]: Secondary | ICD-10-CM

## 2016-09-09 DIAGNOSIS — Z1379 Encounter for other screening for genetic and chromosomal anomalies: Secondary | ICD-10-CM

## 2016-09-09 DIAGNOSIS — C50312 Malignant neoplasm of lower-inner quadrant of left female breast: Secondary | ICD-10-CM

## 2016-09-09 DIAGNOSIS — Z5111 Encounter for antineoplastic chemotherapy: Secondary | ICD-10-CM | POA: Diagnosis not present

## 2016-09-09 DIAGNOSIS — C50912 Malignant neoplasm of unspecified site of left female breast: Secondary | ICD-10-CM

## 2016-09-09 DIAGNOSIS — M810 Age-related osteoporosis without current pathological fracture: Secondary | ICD-10-CM

## 2016-09-09 MED ORDER — FULVESTRANT 250 MG/5ML IM SOLN
500.0000 mg | Freq: Once | INTRAMUSCULAR | Status: AC
  Administered 2016-09-09: 500 mg via INTRAMUSCULAR
  Filled 2016-09-09: qty 10

## 2016-09-09 NOTE — Progress Notes (Signed)
ID: Sonia Hill   DOB: [birth year]  MR#: [id number]  ZWC#:[id number]  Patient Care Team: [name], MD as PCP - General (Family Medicine) Chauncey Cruel, MD as Consulting Physician (Oncology) Molli Posey, MD as Consulting Physician (Obstetrics and Gynecology) Laurence Spates, MD as Consulting Physician (Gastroenterology) Leighton Ruff, MD as Consulting Physician (General Surgery) Delrae Rend, MD as Consulting Physician (Endocrinology) Laureen Abrahams, RN as Registered Nurse (Oncology)   CHIEF COMPLAINT: Metastatic breast cancer, estrogen receptor positive  CURRENT TREATMENT: fulvestrant, palbociclib. Refuses bisphosphonates or denosumab  INTERVAL HISTORY: Sonia Hill returns today for follow-up of her stage IV estrogen receptor positive breast cancer. She continues on monthly fulvestrant and palbociclib, 21 days on, 7 days off. She tolerates the fulvestrant shot without any unusual side effects. She thinks this shots make her a little bit more constipated. Of course she has had some "knots", and her buttock where the shots cause some inflammation and then scar.  The main problem she has had from the palbociclib this dry skin and she was diagnosed with rosacea not long ago. Her fatigue is much better since she started getting vitamin C infusions.  She was just restaged with a CT scan of the chest which shows no active disease in the lungs or upper abdomen and no acute bony findings. MRI of the pelvis shows persistent circumferential thickening of the distal rectum measuring 0.9 cm, extending approximately 6 cm up to the anal verge, with no evidence of extension beyond the serosal surface. There is persistent enhancement through this region on postcontrast exam with no abnormal enhancement in the right presacral space where there was before hypermetabolic tissue.  Tumor markers are stable to slightly increased.    REVIEW OF SYSTEMS: Sonia Hill tells me she feels "the best she has felt in a  long time". She is doing rectal ozone treatments twice a day, as before she gets out of bed and then just before she goes to bed at night. This has regularized her bowel movements, which otherwise were unpredictable and interfering with her work. She does have some back and hip pain and this can cause her some problems at night. She is seeing a chiropractor for this with some success. She has had a mild cough. She tells me her husband had a terrible cold, in addition to his thyroid problems and she is hoping the will be able to enjoy their [place] trip to [place] this weekend and the [place] trip in June they plan with her grandchildren. Aside from all these issues a detailed review of systems today was stable   BREAST CANCER HISTORY:  From the initial intake note:  Sonia Hill had screening mammography on 11/10/2009 showing very dense tissue with a possible distortion in the left breast.  Digital mammography on June 10th showed a 1.5 cm cluster of microcalcifications in the lower inner portion of the breast.  On physical exam, there was no palpable mass and no palpable axillary adenopathy.  There was some nipple inversion inferolaterally.  Ultrasound showed a 2.6 cm irregular mass-like area with dense posterior acoustic shadowing in the 3 o'clock position, 1 cm from the nipple, but normal appearing left axillary lymph nodes.    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  A second mass noted on ultrasound was also lobular, also low grade.  Both were ER and PR positive at well  over 90%.  Both had low proliferation markers at 9%, and both were Her-2 negative by CISH with ratios of 1.6 and 1.24.  In short, both masses were nearly identical.    On June 16th, the patient had bilateral breast MRIs which showed in the left breast at 3 o'clock an irregular area of  abnormal enhancement measuring up to 2.7 cm and in the left lower inner quadrant, a second post-biopsy area measuring 1.5 cm.  The difference between these two areas was stated at the conference the morning of the visit to be approximately 5 cm.  Given this data the patient opted for Left mastectomy with sentinel lymph node sampling, performed July 2011 with results as detailed below.  Subsequent history is as detailed below.    PAST MEDICAL HISTORY: Past Medical History:  Diagnosis Date  . Arthritis    neck, upper back, shoulders - no meds - yoga  . Chronic kidney disease    Only has right kidney  . Depression    Hx - no current problem  . GERD (gastroesophageal reflux disease)    diet controlled - no meds  . Hyperlipidemia    diet controlled  . Hypothyroidism   . lt breast ca dx'd 11/2009  . Osteoporosis, unspecified 12/06/2013  . PONV (postoperative nausea and vomiting)   . SVD (spontaneous vaginal delivery)    x 1  1. Osteopenia. 2. Congenital left renal atrophy.  3. History of recurrent UTIs. 4. History of bladder reconstruction for reflux more than 18 years ago. 5. History of depression and anxiety. 6. History of hypothyroidism.  7. History of "sling procedure".  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    FAMILY HISTORY The patient's father is alive in his early 37s.  He has Alzheimer's disease. The patient's mother was diagnosed with breast cancer at the age of 110.  She died at the age of 55.  The patient's father had one out of two sisters with breast cancer.  There are other breast cancers on the mother's side.  The patient has one brother and one younger sister, neither with cancer.    GYNECOLOGIC HISTORY:  (Reviewed 12/06/2013) She is GX P1.  That pregnancy was at age 49.  Menarche was at age 19.  She was perimenopausal at the time of diagnosis but is now convincingly postmenopausal  SOCIAL HISTORY:   (Reviewed 12/06/2013) She works as a Electrical engineer, part-time and on consultation.  Her husband, [name], is a Architect at [employer].  [name] works as a Pharmacist, hospital in [place].  The patient's son [name] is  in college.  The patient attends [employer].   ADVANCED DIRECTIVES: in place  HEALTH MAINTENANCE: (Updated 12/06/2013)  Social History  Substance Use Topics  . Smoking status: Never Smoker  . Smokeless tobacco: Never Used  . Alcohol use No     Colonoscopy: 06/18/2015; [facility]  PAP: UTD/May 2015, Dr. Matthew Saras  Bone density: [name] 2014, osteoporosis  Lipid panel: UTD (Not on file)    Allergies  Allergen Reactions  . Adhesive [Tape] Rash    Ok to use paper tape and tegaderm over IV site  . Betadine [Povidone Iodine] Rash  . Mercury Rash    Reaction mercurachrome  . Sulfa Antibiotics Rash    Current Outpatient Prescriptions  Medication Sig Dispense Refill  . ascorbic acid (VITAMIN C) 1000 MG tablet Take 1,000 mg by mouth 3 (three) times  daily.     . Ashwagandha 125 MG CAPS Take 125 mg by mouth daily.     . Cholecalciferol (VITAMIN D-3) 5000 UNITS TABS Take 5,000 Units by mouth daily.     Marland Kitchen docusate sodium (COLACE) 250 MG capsule Take 1 capsule (250 mg total) by mouth daily. 10 capsule 0  . FOLIC ACID PO Take 8,676 mcg by mouth daily.     Leslee Home 100 MG capsule TAKE ONE CAPSULE (100 MG) BY MOUTH DAILY WITH FOOD FOR 21 DAYS OF A 28DAY CYCLE.  SWALLOW WHOLE.  DO NOT TAKE IF CAPSULES ARE BROKEN OR CRA 21 capsule 6  . MAGNESIUM PO Take 325 mg by mouth at bedtime as needed (leg cramps). Magnesium calm - powder    . Multiple Vitamin (MULTIVITAMIN WITH MINERALS) TABS tablet Take 1 tablet by mouth daily.    . Multiple Vitamins-Minerals (HAIR/SKIN/NAILS/BIOTIN) TABS Take 1 tablet by mouth daily.    . niacin 250 MG tablet Take 250 mg by mouth at bedtime.    . Omega-3 Fatty Acids (FISH OIL) 1200 MG CAPS Take 1,200 mg by mouth 2 (two) times daily.    Marland Kitchen  OVER THE COUNTER MEDICATION Take 12.5 mg by mouth daily. Iodoral Iodine  supplement    . Probiotic Product (PROBIOTIC DAILY PO) Take 1 tablet by mouth daily.     Marland Kitchen thyroid (NATURE-THROID) 65 MG tablet Take 81.25 mg by mouth daily.     No current facility-administered medications for this visit.     OBJECTIVE: Middle aged white woman Who appears well  Vitals:   09/09/16 1016  BP: (!) 149/68  Pulse: 83  Resp: 18  Temp: 98.2 F (36.8 C)     Body mass index is 20.97 kg/m.    ECOG FS: 1 Filed Weights   09/09/16 1016  Weight: 111 lb (50.3 kg)   Sclerae unicteric, pupils round and equal Oropharynx clear and moist No cervical or supraclavicular adenopathy Lungs no rales or rhonchi Heart regular rate and rhythm Abd soft, nontender, positive bowel sounds MSK no focal spinal tenderness, no upper extremity lymphedema Neuro: nonfocal, well oriented, appropriate affect Breasts:   LAB RESULTS:   Lab Results  Component Value Date   WBC 5.0 09/02/2016   NEUTROABS 3.7 09/02/2016   HGB 12.8 09/02/2016   HCT 36.3 09/02/2016   MCV 97.2 09/02/2016   PLT 244 09/02/2016      Chemistry      Component Value Date/Time   NA 134 (L) 09/02/2016 0908   K 3.9 09/02/2016 0908   CL 98 12/03/2012 1419   CO2 25 09/02/2016 0908   BUN 13.4 09/02/2016 0908   CREATININE 0.9 09/02/2016 0908      Component Value Date/Time   CALCIUM 10.1 09/02/2016 0908   ALKPHOS 80 09/02/2016 0908   AST 29 09/02/2016 0908   ALT 40 09/02/2016 0908   BILITOT 0.67 09/02/2016 0908      Lab Results  Component Value Date   LABCA2 181 (H) 10/23/2015   STUDIES: Ct Chest W Contrast  Result Date: 09/02/2016 CLINICAL DATA:  Metastatic breast cancer. EXAM: CT CHEST WITH CONTRAST TECHNIQUE: Multidetector CT imaging of the chest was performed during intravenous contrast administration. CONTRAST:  1 ISOVUE-300 IOPAMIDOL (ISOVUE-300) INJECTION 61% COMPARISON:  PET-CT 06/05/2015 FINDINGS: Cardiovascular: No significant  vascular findings. Normal heart size. No pericardial effusion.Normal heart size. No pericardial effusion. Mediastinum/Nodes: There is a small nodule in the left upper lobe measuring 4 mm, image 16 of series 7. Unchanged  from previous exam. Calcified granuloma is identified within the perifissural portion of the lingula. Lungs/Pleura: No pleural fluid. No atelectasis or airspace consolidation. Upper Abdomen: No acute abnormality. Musculoskeletal: No chest wall abnormality. No acute or significant osseous findings. Status post left mastectomy and left axillary nodal dissection. IMPRESSION: 1. No acute cardiopulmonary abnormalities. 2. No findings to suggest residual tumor or metastatic disease. Electronically Signed   By: Kerby Moors M.D.   On: 09/02/2016 14:27   Mr Pelvis W Wo Contrast  Result Date: 09/02/2016 CLINICAL DATA:  Stage IV lobular breast carcinoma with bone metastasis and rectal/ presacral metastasis. EXAM: MRI PELVIS WITHOUT AND WITH CONTRAST TECHNIQUE: Multiplanar multisequence MR imaging of the pelvis was performed both before and after administration of intravenous contrast. CONTRAST:  65m MULTIHANCE GADOBENATE DIMEGLUMINE 529 MG/ML IV SOLN COMPARISON:  Pelvic MRI [id number] PET-CT 06/05/2015 FINDINGS: Urinary Tract: Normal RIGHT kidney. Normal RIGHT ureter and bladder by MR imaging Bowel: Persistent circumferential thickening of the distal rectum measuring 9 mm in single wall thickness (image 22, series 6) compared to 9 mm on comparison MRI 03/03/2016. This thickening extends over approximately 6 cm segment leading up to the anal verge. No interval change from comparison exam. No evidence of extension of process beyond the serosal surface of the rectum. Persistent enhancement through this region on post-contrast exam. There is no abnormal enhancement or nodular thickening in the RIGHT presacral space at site of prior hypermetabolic tissue. Vascular/Lymphatic: No pathologically enlarged pelvic  lymph nodes. Ovoid Fluid collection along the LEFT iliac vessels measuring 13 mm (image 6, series 6) is not changed from prior. This may represent a small lymphocele or seroma. Reproductive:  Uterus is small.  Ovaries not identified. Other: Smaller free fluid in the deep RIGHT pelvis similar to comparison exam. Musculoskeletal: No aggressive osseous lesion identified. IMPRESSION: 1. Persistent in circumferential rectal thickening not changed from comparison exam. No evidence of extension beyond the serosal surface of this process. 2. No evidence of nodularity or abnormal enhancement presacral space to suggest metastatic disease tumor recurrence. 3. No pelvic lymphadenopathy. Electronically Signed   By: SSuzy BouchardM.D.   On: 09/02/2016 12:32     ASSESSMENT: 61y.o.  [facility]  (1) status post left mastectomy and sentinel lymph node sampling in July 2011 for a lower inner quadrant T1 N1 M1, stage IV invasive lobular breast cancer, grade 1, strongly estrogen and progesterone receptor-positive, HER2 negative with MIB-1 of 9% and no HER2 amplification,   (2) with multiple sclerotic bone lesions at presentation seen only on CT scan (not on bone scan or PET scan), but  with biopsy-proven metastatic disease to bone; and an elevated CA 27.29 at presentation,   (3) Oncotype recurrence score of 4, predicts a good response to antiestrogens.  (4) Systemic treatment has consisted of  a) tamoxifen with evidence of response but poor tolerance  b) letrozole starting August 2012, discontinued October 2014 per patient   (5) single functioning kidney  (6) status post bilateral salpingo-oophorectomy 01/24/2013, with benign pathology  (7) osteoporosis; the patient refuses bisphosphonate therapy; [facility]    (a) bone density [facility] 06[facility]25[facility]2012, showing osteoporosis with a T score -2.6. This  was repeated 12/20/2013, showing again osteoporosis with T-scores between -2.4 and -2.8.  (b) the patient refuses zolendronate, denosumab, or other pharmacologic intervention  (8) the patient refuses standard Mammography or tomography; undergoing thermography screening of  the right breast.  (9) PET scan 06/05/2015 shows rectal thickening and a presacral mass; biopsy of this area 10/09/2015 confirms metastatic lobular breast cancer, again estrogen receptor positive, HER-2 nonamplified.  (a) pelvic MRI 03/11/2016 confirms stability of disease  (b) chest CT and pelvic MRI 09/02/2016 shows no change in the circumferential rectal thickening or evidence of extension beyond the serosa and chest CT findings  (10) started fulvestrant and palbociclib 125 mg/ day [21/7] May 2017  (a) palbociclib dose decreased to 100 mg daily [21/7] with second cycle, started 11/25/2015   PLAN:   Jewelianna continues to tolerate her treatments remarkably well and the recent staging studies are very favorable. In particular the fact that we do not see visceral disease in the lungs or liver or any active bone disease on the chest CT or MRI of the pelvis is encouraging.  The rectal mass is unchanged. If we can continue good control with the current treatment Would be optimal, since any step beyond this one will make it a little bit more difficult for her in terms of side effects. 1 simple possibility in terms of intensification of therapy might be to add anastrozole to the fulvestrant but she has tolerated this poorly in the past and given that she ready is having some back issues that probably would not be a welcome change for her.  Accordingly we are continuing as before. She will receive fulvestrant every 28 days. We will check her counts at the same time as she receives the shots and adjust the palbociclib dose as needed but for now she is tolerating 100 mg daily quite well.  She will see me again in 3 months from now. At that  point we will consider restaging with a bone scan or other imaging modality and of course repeating an MRI of the rectal area, most likely in September  She knows to call for any problems that may develop before her next visit here     Chauncey Cruel, MD 09/09/2016

## 2016-09-09 NOTE — Patient Instructions (Signed)

## 2016-09-26 ENCOUNTER — Telehealth: Payer: Self-pay | Admitting: Pharmacist

## 2016-09-26 NOTE — Telephone Encounter (Signed)
Oral Chemotherapy Pharmacist Encounter  Received notification from [facility] that patient's Leslee Home would require a new prior authorization. PA form completed and faxed back to [phone]  Oral Oncology Clinic will continue to follow.  Johny Drilling, PharmD, BCPS, BCOP 09/26/2016  9:47 AM Oral Oncology Clinic [phone]

## 2016-10-07 ENCOUNTER — Ambulatory Visit (HOSPITAL_BASED_OUTPATIENT_CLINIC_OR_DEPARTMENT_OTHER): Payer: 59

## 2016-10-07 ENCOUNTER — Other Ambulatory Visit (HOSPITAL_BASED_OUTPATIENT_CLINIC_OR_DEPARTMENT_OTHER): Payer: 59

## 2016-10-07 DIAGNOSIS — M810 Age-related osteoporosis without current pathological fracture: Secondary | ICD-10-CM

## 2016-10-07 DIAGNOSIS — C50312 Malignant neoplasm of lower-inner quadrant of left female breast: Secondary | ICD-10-CM

## 2016-10-07 DIAGNOSIS — Z170 Estrogen receptor positive status [ER+]: Secondary | ICD-10-CM

## 2016-10-07 DIAGNOSIS — C7951 Secondary malignant neoplasm of bone: Secondary | ICD-10-CM

## 2016-10-07 DIAGNOSIS — Z5111 Encounter for antineoplastic chemotherapy: Secondary | ICD-10-CM | POA: Diagnosis not present

## 2016-10-07 DIAGNOSIS — C50912 Malignant neoplasm of unspecified site of left female breast: Secondary | ICD-10-CM

## 2016-10-07 DIAGNOSIS — Z1379 Encounter for other screening for genetic and chromosomal anomalies: Secondary | ICD-10-CM

## 2016-10-07 LAB — COMPREHENSIVE METABOLIC PANEL
ALBUMIN: 4.2 g/dL (ref 3.5–5.0)
ALT: 47 U/L (ref 0–55)
AST: 37 U/L — AB (ref 5–34)
Alkaline Phosphatase: 88 U/L (ref 40–150)
Anion Gap: 10 mEq/L (ref 3–11)
BILIRUBIN TOTAL: 0.5 mg/dL (ref 0.20–1.20)
BUN: 13.3 mg/dL (ref 7.0–26.0)
CALCIUM: 10.1 mg/dL (ref 8.4–10.4)
CO2: 26 mEq/L (ref 22–29)
Chloride: 100 mEq/L (ref 98–109)
Creatinine: 1 mg/dL (ref 0.6–1.1)
EGFR: 62 mL/min/{1.73_m2} — AB (ref >90)
Glucose: 82 mg/dl (ref 70–140)
POTASSIUM: 4.2 meq/L (ref 3.5–5.1)
Sodium: 135 mEq/L — ABNORMAL LOW (ref 136–145)
TOTAL PROTEIN: 7.3 g/dL (ref 6.4–8.3)

## 2016-10-07 LAB — CBC WITH DIFFERENTIAL/PLATELET
BASO%: 1 % (ref 0.0–2.0)
BASOS ABS: 0 10*3/uL (ref 0.0–0.1)
EOS%: 1.7 % (ref 0.0–7.0)
Eosinophils Absolute: 0.1 10*3/uL (ref 0.0–0.5)
HCT: 37.6 % (ref 34.8–46.6)
HGB: 13.2 g/dL (ref 11.6–15.9)
LYMPH#: 0.7 10*3/uL — AB (ref 0.9–3.3)
LYMPH%: 22.6 % (ref 14.0–49.7)
MCH: 33.3 pg (ref 25.1–34.0)
MCHC: 35.1 g/dL (ref 31.5–36.0)
MCV: 94.9 fL (ref 79.5–101.0)
MONO#: 0.2 10*3/uL (ref 0.1–0.9)
MONO%: 7.1 % (ref 0.0–14.0)
NEUT%: 67.6 % (ref 38.4–76.8)
NEUTROS ABS: 2 10*3/uL (ref 1.5–6.5)
PLATELETS: 245 10*3/uL (ref 145–400)
RBC: 3.96 10*6/uL (ref 3.70–5.45)
RDW: 12.7 % (ref 11.2–14.5)
WBC: 3 10*3/uL — ABNORMAL LOW (ref 3.9–10.3)

## 2016-10-07 LAB — CEA (IN HOUSE-CHCC): CEA (CHCC-IN HOUSE): 41.46 ng/mL — AB (ref 0.00–5.00)

## 2016-10-07 MED ORDER — FULVESTRANT 250 MG/5ML IM SOLN
500.0000 mg | Freq: Once | INTRAMUSCULAR | Status: AC
  Administered 2016-10-07: 500 mg via INTRAMUSCULAR
  Filled 2016-10-07: qty 10

## 2016-10-07 NOTE — Patient Instructions (Signed)

## 2016-10-08 LAB — CANCER ANTIGEN 27.29: CA 27.29: 141.1 U/mL — ABNORMAL HIGH (ref 0.0–38.6)

## 2016-10-14 ENCOUNTER — Telehealth: Payer: Self-pay | Admitting: *Deleted

## 2016-10-14 ENCOUNTER — Other Ambulatory Visit: Payer: Self-pay | Admitting: *Deleted

## 2016-10-14 DIAGNOSIS — R194 Change in bowel habit: Secondary | ICD-10-CM

## 2016-10-14 DIAGNOSIS — C7951 Secondary malignant neoplasm of bone: Principal | ICD-10-CM

## 2016-10-14 DIAGNOSIS — C50912 Malignant neoplasm of unspecified site of left female breast: Secondary | ICD-10-CM

## 2016-10-14 DIAGNOSIS — K639 Disease of intestine, unspecified: Secondary | ICD-10-CM

## 2016-10-14 NOTE — Telephone Encounter (Signed)
This RN spoke with pt per her call stating concern due to onset of bowel changes - and her concern for recurrence of issues she has had before requiring visits with a GI MD.  Sonia Hill states she usually stays constipated which she manages " but now I seem more constipated but have an oozing"  Sonia Hill would like to get scheduled with her GI MD " before things get worse "  This RN discussed bowel changes and possible need to obtain a KUB for evaluation.  Sonia Hill declined at present stating " I will let you know if things get worse"  Per discussion Sonia Hill denies any bowel or abdominal pain or abdominal swelling.  Referral placed for Dr Oletta Lamas and pt will call regarding getting an appointment.

## 2016-10-24 ENCOUNTER — Other Ambulatory Visit: Payer: Self-pay | Admitting: Oncology

## 2016-10-29 ENCOUNTER — Other Ambulatory Visit: Payer: Self-pay | Admitting: Oncology

## 2016-10-29 DIAGNOSIS — C50919 Malignant neoplasm of unspecified site of unspecified female breast: Secondary | ICD-10-CM

## 2016-10-29 DIAGNOSIS — C50312 Malignant neoplasm of lower-inner quadrant of left female breast: Secondary | ICD-10-CM

## 2016-10-29 DIAGNOSIS — Z170 Estrogen receptor positive status [ER+]: Principal | ICD-10-CM

## 2016-10-29 DIAGNOSIS — C7951 Secondary malignant neoplasm of bone: Secondary | ICD-10-CM

## 2016-10-31 ENCOUNTER — Other Ambulatory Visit: Payer: Self-pay | Admitting: *Deleted

## 2016-10-31 ENCOUNTER — Ambulatory Visit (HOSPITAL_COMMUNITY)
Admission: RE | Admit: 2016-10-31 | Discharge: 2016-10-31 | Disposition: A | Discharge status: Home / Self Care | Payer: 59 | Source: Ambulatory Visit | Attending: Oncology | Admitting: Oncology

## 2016-10-31 ENCOUNTER — Telehealth: Payer: Self-pay

## 2016-10-31 ENCOUNTER — Other Ambulatory Visit: Payer: Self-pay | Admitting: Oncology

## 2016-10-31 DIAGNOSIS — R05 Cough: Secondary | ICD-10-CM

## 2016-10-31 DIAGNOSIS — R059 Cough, unspecified: Secondary | ICD-10-CM

## 2016-10-31 DIAGNOSIS — Z7951 Long term (current) use of inhaled steroids: Secondary | ICD-10-CM | POA: Diagnosis not present

## 2016-10-31 DIAGNOSIS — C7951 Secondary malignant neoplasm of bone: Principal | ICD-10-CM

## 2016-10-31 DIAGNOSIS — C50919 Malignant neoplasm of unspecified site of unspecified female breast: Secondary | ICD-10-CM | POA: Insufficient documentation

## 2016-10-31 DIAGNOSIS — Z9012 Acquired absence of left breast and nipple: Secondary | ICD-10-CM | POA: Insufficient documentation

## 2016-10-31 NOTE — Telephone Encounter (Signed)
Pt has a persistant cough for about the last 2 months. She is not aware of having seasonal allergies. Losing sleep from coughing,  It had just started when she had her ov with Dr [name] on 3/30. Dry hacking, post nasal drainage at night. Does not feel like it is infection. She has not run a fever. She is tired and draggy. She is hoarse from coughing.  She has used natural allergy by arizonal natural, neti pot when nose congested, homeopathic for dust mold mildew and airborn pathogens, ellerberry syrup for cough. Has controlled it some but not knocking it out.  She feels like she probably needs a CXR to rule out mets.

## 2016-10-31 NOTE — Telephone Encounter (Signed)
Spoke with pt. Order for Xray placed.

## 2016-11-04 ENCOUNTER — Other Ambulatory Visit (HOSPITAL_BASED_OUTPATIENT_CLINIC_OR_DEPARTMENT_OTHER): Payer: 59

## 2016-11-04 ENCOUNTER — Other Ambulatory Visit: Payer: 59

## 2016-11-04 ENCOUNTER — Ambulatory Visit: Payer: 59

## 2016-11-04 ENCOUNTER — Ambulatory Visit (HOSPITAL_BASED_OUTPATIENT_CLINIC_OR_DEPARTMENT_OTHER): Payer: 59

## 2016-11-04 DIAGNOSIS — M810 Age-related osteoporosis without current pathological fracture: Secondary | ICD-10-CM

## 2016-11-04 DIAGNOSIS — C7951 Secondary malignant neoplasm of bone: Secondary | ICD-10-CM | POA: Diagnosis not present

## 2016-11-04 DIAGNOSIS — Z5111 Encounter for antineoplastic chemotherapy: Secondary | ICD-10-CM

## 2016-11-04 DIAGNOSIS — Z1379 Encounter for other screening for genetic and chromosomal anomalies: Secondary | ICD-10-CM

## 2016-11-04 DIAGNOSIS — C50312 Malignant neoplasm of lower-inner quadrant of left female breast: Secondary | ICD-10-CM

## 2016-11-04 DIAGNOSIS — Z170 Estrogen receptor positive status [ER+]: Secondary | ICD-10-CM

## 2016-11-04 DIAGNOSIS — C50912 Malignant neoplasm of unspecified site of left female breast: Secondary | ICD-10-CM

## 2016-11-04 DIAGNOSIS — C50919 Malignant neoplasm of unspecified site of unspecified female breast: Secondary | ICD-10-CM

## 2016-11-04 LAB — CBC WITH DIFFERENTIAL/PLATELET
BASO%: 0.4 % (ref 0.0–2.0)
Basophils Absolute: 0 10*3/uL (ref 0.0–0.1)
EOS%: 1.6 % (ref 0.0–7.0)
Eosinophils Absolute: 0.1 10*3/uL (ref 0.0–0.5)
HCT: 36 % (ref 34.8–46.6)
HEMOGLOBIN: 12.5 g/dL (ref 11.6–15.9)
LYMPH%: 18.5 % (ref 14.0–49.7)
MCH: 34.2 pg — ABNORMAL HIGH (ref 25.1–34.0)
MCHC: 34.7 g/dL (ref 31.5–36.0)
MCV: 98.4 fL (ref 79.5–101.0)
MONO#: 0.3 10*3/uL (ref 0.1–0.9)
MONO%: 6 % (ref 0.0–14.0)
NEUT%: 73.5 % (ref 38.4–76.8)
NEUTROS ABS: 3.3 10*3/uL (ref 1.5–6.5)
Platelets: 276 10*3/uL (ref 145–400)
RBC: 3.66 10*6/uL — AB (ref 3.70–5.45)
RDW: 13 % (ref 11.2–14.5)
WBC: 4.5 10*3/uL (ref 3.9–10.3)
lymph#: 0.8 10*3/uL — ABNORMAL LOW (ref 0.9–3.3)

## 2016-11-04 LAB — CEA (IN HOUSE-CHCC): CEA (CHCC-IN HOUSE): 67.18 ng/mL — AB (ref 0.00–5.00)

## 2016-11-04 MED ORDER — FULVESTRANT 250 MG/5ML IM SOLN
500.0000 mg | Freq: Once | INTRAMUSCULAR | Status: AC
  Administered 2016-11-04: 500 mg via INTRAMUSCULAR
  Filled 2016-11-04: qty 10

## 2016-11-04 NOTE — Patient Instructions (Signed)

## 2016-11-05 LAB — CANCER ANTIGEN 27.29: CAN 27.29: 180.4 U/mL — AB (ref 0.0–38.6)

## 2016-12-02 ENCOUNTER — Ambulatory Visit (HOSPITAL_BASED_OUTPATIENT_CLINIC_OR_DEPARTMENT_OTHER): Payer: 59 | Admitting: Hematology and Oncology

## 2016-12-02 ENCOUNTER — Other Ambulatory Visit (HOSPITAL_BASED_OUTPATIENT_CLINIC_OR_DEPARTMENT_OTHER): Payer: 59

## 2016-12-02 ENCOUNTER — Ambulatory Visit (HOSPITAL_BASED_OUTPATIENT_CLINIC_OR_DEPARTMENT_OTHER): Payer: 59

## 2016-12-02 VITALS — BP 141/66 | HR 75 | Temp 98.1°F | Resp 20 | Ht 61.0 in | Wt 104.2 lb

## 2016-12-02 DIAGNOSIS — C50312 Malignant neoplasm of lower-inner quadrant of left female breast: Secondary | ICD-10-CM

## 2016-12-02 DIAGNOSIS — C7951 Secondary malignant neoplasm of bone: Secondary | ICD-10-CM

## 2016-12-02 DIAGNOSIS — K625 Hemorrhage of anus and rectum: Secondary | ICD-10-CM | POA: Diagnosis not present

## 2016-12-02 DIAGNOSIS — M810 Age-related osteoporosis without current pathological fracture: Secondary | ICD-10-CM

## 2016-12-02 DIAGNOSIS — Z1379 Encounter for other screening for genetic and chromosomal anomalies: Secondary | ICD-10-CM

## 2016-12-02 DIAGNOSIS — C50912 Malignant neoplasm of unspecified site of left female breast: Secondary | ICD-10-CM

## 2016-12-02 DIAGNOSIS — Z170 Estrogen receptor positive status [ER+]: Secondary | ICD-10-CM | POA: Diagnosis not present

## 2016-12-02 DIAGNOSIS — C50919 Malignant neoplasm of unspecified site of unspecified female breast: Secondary | ICD-10-CM

## 2016-12-02 DIAGNOSIS — Z5111 Encounter for antineoplastic chemotherapy: Secondary | ICD-10-CM

## 2016-12-02 LAB — CBC WITH DIFFERENTIAL/PLATELET
BASO%: 1.2 % (ref 0.0–2.0)
Basophils Absolute: 0 10*3/uL (ref 0.0–0.1)
EOS%: 5.8 % (ref 0.0–7.0)
Eosinophils Absolute: 0.2 10*3/uL (ref 0.0–0.5)
HEMATOCRIT: 36.5 % (ref 34.8–46.6)
HGB: 12.7 g/dL (ref 11.6–15.9)
LYMPH#: 1 10*3/uL (ref 0.9–3.3)
LYMPH%: 28.8 % (ref 14.0–49.7)
MCH: 34.3 pg — ABNORMAL HIGH (ref 25.1–34.0)
MCHC: 34.8 g/dL (ref 31.5–36.0)
MCV: 98.6 fL (ref 79.5–101.0)
MONO#: 0.2 10*3/uL (ref 0.1–0.9)
MONO%: 5.8 % (ref 0.0–14.0)
NEUT%: 58.4 % (ref 38.4–76.8)
NEUTROS ABS: 2 10*3/uL (ref 1.5–6.5)
PLATELETS: 248 10*3/uL (ref 145–400)
RBC: 3.7 10*6/uL (ref 3.70–5.45)
RDW: 13 % (ref 11.2–14.5)
WBC: 3.5 10*3/uL — AB (ref 3.9–10.3)

## 2016-12-02 LAB — CEA (IN HOUSE-CHCC): CEA (CHCC-In House): 60.43 ng/mL — ABNORMAL HIGH (ref 0.00–5.00)

## 2016-12-02 MED ORDER — FULVESTRANT 250 MG/5ML IM SOLN
500.0000 mg | Freq: Once | INTRAMUSCULAR | Status: AC
  Administered 2016-12-02: 500 mg via INTRAMUSCULAR
  Filled 2016-12-02: qty 10

## 2016-12-02 NOTE — Progress Notes (Signed)
Sonia Hill:  Assessment: Breast cancer metastasized to multiple sites [facility]) 61 year old female with metastatic hormone-receptor positive breast cancer, currently undergoing therapy with palbociclib and fulvestrant. Appears to be tolerating therapy reasonably well so far. Laboratory demonstrates no significant toxicity at the moment.  Previous lab work obtained in May demonstrate slight rise in the level of CA 29.27, but patient presents no new symptoms consistent with disease progression this time. Answer marker level is pending from today.  --OK for treatment today --Continue monthly treatment with fulvestant and labs --MRI pelvis and Bone scan prior to the next Hill --On RTC in 3 months: Labs, clinic Hill and fulvestrant   Orders Placed This Encounter  Procedures  . NM Bone Scan Whole Body    Standing Status:   Future    Standing Expiration Date:   12/02/2017    Order Specific Question:   Reason for Exam (SYMPTOM  OR DIAGNOSIS REQUIRED)    Answer:   Metastatic breast cancer, eval for possible progressive disease in the skeletal structures    Order Specific Question:   If indicated for the ordered procedure, I authorize the administration of a radiopharmaceutical per Radiology protocol    Answer:   Yes    Order Specific Question:   Preferred imaging location?    Answer:   [facility]    Order Specific Question:   Radiology Contrast Protocol - do NOT remove file path    Answer:   \\[email or web address]\[email or web address]  . MR Pelvis W Contrast    Standing Status:   Future    Standing Expiration Date:   12/02/2017    Order Specific Question:   If indicated for the ordered procedure, I authorize the administration of contrast media per Radiology protocol    Answer:   Yes    Order Specific Question:   Reason for Exam (SYMPTOM  OR DIAGNOSIS REQUIRED)    Answer:   Metastatic breast cancer with known perirectal disease. please evaluate for  progression evidence    Order Specific Question:   What is the patient's sedation requirement?    Answer:   No Sedation    Order Specific Question:   Does the patient have a pacemaker or implanted devices?    Answer:   No    Order Specific Question:   Preferred imaging location?    Answer:   [facility] (table limit-350 lbs)    Order Specific Question:   Radiology Contrast Protocol - do NOT remove file path    Answer:   \\[email or web address]\Radiant\[email or web address]    Cancer Staging Breast cancer metastasized to bone [facility]) Staging form: Breast, AJCC 7th Edition - Clinical: Stage IV ([place], NX, M1) - Signed by Chauncey Cruel, MD on 03/18/2014  Cancer of lower-inner quadrant of left female breast [facility]) Staging form: Breast, AJCC 7th Edition - Clinical: Stage IV ([place], NX, M1) - Signed by Chauncey Cruel, MD on 12/03/2014   All questions were answered.  . The patient knows to call the clinic with any problems, questions or concerns.  This note was electronically signed.    History of Presenting Illness Sonia Hill 61 y.o. presenting to the [facility] for her continued clinical monitoring while receiving palliative systemic therapy with fulvestrant and palbociclib. Patient denies any progressive pain, and appears to be tolerating treatment without any significant difficulties. She does report having some small amounts of rectal bleeding which she attributes to hemorrhoids, but she does also have a history of a  perirectal mass due to metastatic disease of her breast. She is currently scheduled to undergo a gastroenterology evaluation, but the Hill was supposed to happen yesterday has been postponed until the next Monday.  Patient has no other new complaints. Denies chest pain, shortness of breath or cough. Denies nausea, vomiting, abdominal pain, diarrhea or constipation.   Oncological/hematological History:  No history exists.    Medical History: Past Medical History:   Diagnosis Date  . Arthritis    neck, upper back, shoulders - no meds - yoga  . Chronic kidney disease    Only has right kidney  . Depression    Hx - no current problem  . GERD (gastroesophageal reflux disease)    diet controlled - no meds  . Hyperlipidemia    diet controlled  . Hypothyroidism   . lt breast ca dx'd 11/2009  . Osteoporosis, unspecified 12/06/2013  . PONV (postoperative nausea and vomiting)   . SVD (spontaneous vaginal delivery)    x 1    Surgical History: Past Surgical History:  Procedure Laterality Date  . BLADDER NECK RECONSTRUCTION     made a new urethea tube  . bladder tact  2008  . BREAST SURGERY     masectomy left breast  . coloscopy    . DILATION AND CURETTAGE OF UTERUS    . LAPAROSCOPY N/A 01/24/2013   Procedure: LAPROSCOPY OPERATIVE;  Surgeon: Margarette Asal, MD;  Location: [facility];  Service: Gynecology;  Laterality: N/A;  . left kidney removed     left kidney dysfunction  . SALPINGOOPHORECTOMY Bilateral 01/24/2013   Procedure: SALPINGO OOPHORECTOMY;  Surgeon: Margarette Asal, MD;  Location: [facility];  Service: Gynecology;  Laterality: Bilateral;  . WISDOM TOOTH EXTRACTION      Family History: Family History  Problem Relation Age of Onset  . Breast cancer Mother 16  . Breast cancer Maternal Aunt 58  . Breast cancer Other        MGF's sister dx with breast cancer in her 24s    Social History: Social History   Social History  . Marital status: Married    Spouse name: N/A  . Number of children: N/A  . Years of education: N/A   Occupational History  . Not on file.   Social History Main Topics  . Smoking status: Never Smoker  . Smokeless tobacco: Never Used  . Alcohol use No  . Drug use: No  . Sexual activity: Yes    Birth control/ protection: Post-menopausal   Other Topics Concern  . Not on file   Social History Narrative  . No narrative on file    Allergies: Allergies  Allergen Reactions  . Adhesive [Tape] Rash    Ok  to use paper tape and tegaderm over IV site  . Betadine [Povidone Iodine] Rash  . Mercury Rash    Reaction mercurachrome  . Sulfa Antibiotics Rash    Medications:  Current Outpatient Prescriptions  Medication Sig Dispense Refill  . ascorbic acid (VITAMIN C) 1000 MG tablet Take 1,000 mg by mouth 3 (three) times daily.     . Ashwagandha 125 MG CAPS Take 125 mg by mouth daily.     . Cholecalciferol (VITAMIN D-3) 5000 UNITS TABS Take 5,000 Units by mouth daily.     Marland Kitchen docusate sodium (COLACE) 250 MG capsule Take 1 capsule (250 mg total) by mouth daily. 10 capsule 0  . FOLIC ACID PO Take 7,253 mcg by mouth daily.     Leslee Home  100 MG capsule TAKE ONE CAPSULE (100 MG) BY MOUTH DAILY WITH FOOD FOR 21 DAYS OF A 28DAY CYCLE.  SWALLOW WHOLE.  DO NOT TAKE IF CAPSULES ARE BROKEN OR CRA 21 capsule 6  . MAGNESIUM PO Take 325 mg by mouth at bedtime as needed (leg cramps). Magnesium calm - powder    . Multiple Vitamin (MULTIVITAMIN WITH MINERALS) TABS tablet Take 1 tablet by mouth daily.    . Multiple Vitamins-Minerals (HAIR/SKIN/NAILS/BIOTIN) TABS Take 1 tablet by mouth daily.    . niacin 250 MG tablet Take 250 mg by mouth at bedtime.    . Omega-3 Fatty Acids (FISH OIL) 1200 MG CAPS Take 1,200 mg by mouth 2 (two) times daily.    Marland Kitchen OVER THE COUNTER MEDICATION Take 12.5 mg by mouth daily. Iodoral Iodine  supplement    . Probiotic Product (PROBIOTIC DAILY PO) Take 1 tablet by mouth daily.     Marland Kitchen thyroid (NATURE-THROID) 65 MG tablet Take 81.25 mg by mouth daily.     No current facility-administered medications for this Hill.     Review of Systems: Review of Systems  All other systems reviewed and are negative.    PHYSICAL EXAMINATION Blood pressure (!) 141/66, pulse 75, temperature 98.1 F (36.7 C), temperature source Oral, resp. rate 20, height 5\' 1"  (1.549 m), weight 104 lb 3.2 oz (47.3 kg), SpO2 100 %.  ECOG PERFORMANCE STATUS: 1 - Symptomatic but completely ambulatory  Physical Exam   Constitutional: She is oriented to person, place, and time and well-developed, well-nourished, and in no distress.  HENT:  Head: Atraumatic.  Mouth/Throat: No oropharyngeal exudate.  Eyes: EOM are normal. Pupils are equal, round, and reactive to light.  Neck: No thyromegaly present.  Cardiovascular: Normal rate, regular rhythm and normal heart sounds.  Exam reveals no gallop.   Pulmonary/Chest: Effort normal and breath sounds normal. She has no wheezes.  Abdominal: Soft. She exhibits no distension and no mass. There is no tenderness. There is no guarding.  Musculoskeletal: She exhibits no edema.  Lymphadenopathy:    She has no cervical adenopathy.  Neurological: She is alert and oriented to person, place, and time.  Skin: Skin is warm. No rash noted. No erythema.     LABORATORY DATA: I have personally reviewed the data as listed: Appointment on 12/02/2016  Component Date Value Ref Range Status  . WBC 12/02/2016 3.5* 3.9 - 10.3 10e3/uL Final  . NEUT# 12/02/2016 2.0  1.5 - 6.5 10e3/uL Final  . HGB 12/02/2016 12.7  11.6 - 15.9 g/dL Final  . HCT 12/02/2016 36.5  34.8 - 46.6 % Final  . Platelets 12/02/2016 248  145 - 400 10e3/uL Final  . MCV 12/02/2016 98.6  79.5 - 101.0 fL Final  . MCH 12/02/2016 34.3* 25.1 - 34.0 pg Final  . MCHC 12/02/2016 34.8  31.5 - 36.0 g/dL Final  . RBC 12/02/2016 3.70  3.70 - 5.45 10e6/uL Final  . RDW 12/02/2016 13.0  11.2 - 14.5 % Final  . lymph# 12/02/2016 1.0  0.9 - 3.3 10e3/uL Final  . MONO# 12/02/2016 0.2  0.1 - 0.9 10e3/uL Final  . Eosinophils Absolute 12/02/2016 0.2  0.0 - 0.5 10e3/uL Final  . Basophils Absolute 12/02/2016 0.0  0.0 - 0.1 10e3/uL Final  . NEUT% 12/02/2016 58.4  38.4 - 76.8 % Final  . LYMPH% 12/02/2016 28.8  14.0 - 49.7 % Final  . MONO% 12/02/2016 5.8  0.0 - 14.0 % Final  . EOS% 12/02/2016 5.8  0.0 - 7.0 %  Final  . BASO% 12/02/2016 1.2  0.0 - 2.0 % Final  . CEA ([facility]) 12/02/2016 60.43* 0.00 - 5.00 ng/mL Final   This test  was performed using [employer]'s Chemiluminescent Microparticle Immunoassay. Values obtained from different assay methods cannot be used interchageably. Please note that 5-10% of patients who smoke may see CEA levels up to 6.9 ng/mL.       Ardath Sax, MD

## 2016-12-02 NOTE — Assessment & Plan Note (Addendum)
61 year old female with metastatic hormone-receptor positive breast cancer, currently undergoing therapy with palbociclib and fulvestrant. Appears to be tolerating therapy reasonably well so far. Laboratory demonstrates no significant toxicity at the moment.  Previous lab work obtained in May demonstrate slight rise in the level of CA 29.27, but patient presents no new symptoms consistent with disease progression this time. Answer marker level is pending from today.  --OK for treatment today --Continue monthly treatment with fulvestant and labs --MRI pelvis and Bone scan prior to the next visit --On RTC in 3 months: Labs, clinic visit and fulvestrant

## 2016-12-03 LAB — CANCER ANTIGEN 27.29: CA 27.29: 166.3 U/mL — ABNORMAL HIGH (ref 0.0–38.6)

## 2016-12-13 ENCOUNTER — Encounter: Payer: Self-pay | Admitting: *Deleted

## 2016-12-26 ENCOUNTER — Encounter: Payer: Self-pay | Admitting: Podiatry

## 2016-12-26 ENCOUNTER — Ambulatory Visit (INDEPENDENT_AMBULATORY_CARE_PROVIDER_SITE_OTHER): Payer: 59 | Admitting: Podiatry

## 2016-12-26 ENCOUNTER — Ambulatory Visit: Payer: 59

## 2016-12-26 DIAGNOSIS — L84 Corns and callosities: Secondary | ICD-10-CM

## 2016-12-28 ENCOUNTER — Other Ambulatory Visit (HOSPITAL_BASED_OUTPATIENT_CLINIC_OR_DEPARTMENT_OTHER): Payer: 59

## 2016-12-28 ENCOUNTER — Other Ambulatory Visit: Payer: Self-pay

## 2016-12-28 ENCOUNTER — Ambulatory Visit (HOSPITAL_BASED_OUTPATIENT_CLINIC_OR_DEPARTMENT_OTHER): Payer: 59

## 2016-12-28 VITALS — BP 144/71 | HR 72 | Temp 97.9°F | Resp 18

## 2016-12-28 DIAGNOSIS — M810 Age-related osteoporosis without current pathological fracture: Secondary | ICD-10-CM

## 2016-12-28 DIAGNOSIS — C7951 Secondary malignant neoplasm of bone: Secondary | ICD-10-CM | POA: Diagnosis not present

## 2016-12-28 DIAGNOSIS — C50312 Malignant neoplasm of lower-inner quadrant of left female breast: Secondary | ICD-10-CM

## 2016-12-28 DIAGNOSIS — C50919 Malignant neoplasm of unspecified site of unspecified female breast: Secondary | ICD-10-CM

## 2016-12-28 DIAGNOSIS — Z5111 Encounter for antineoplastic chemotherapy: Secondary | ICD-10-CM

## 2016-12-28 DIAGNOSIS — Z1379 Encounter for other screening for genetic and chromosomal anomalies: Secondary | ICD-10-CM

## 2016-12-28 DIAGNOSIS — C50912 Malignant neoplasm of unspecified site of left female breast: Secondary | ICD-10-CM

## 2016-12-28 DIAGNOSIS — Z170 Estrogen receptor positive status [ER+]: Secondary | ICD-10-CM

## 2016-12-28 LAB — CBC WITH DIFFERENTIAL/PLATELET
BASO%: 1 % (ref 0.0–2.0)
Basophils Absolute: 0 10*3/uL (ref 0.0–0.1)
EOS%: 2 % (ref 0.0–7.0)
Eosinophils Absolute: 0.1 10*3/uL (ref 0.0–0.5)
HCT: 34.3 % — ABNORMAL LOW (ref 34.8–46.6)
HGB: 11.8 g/dL (ref 11.6–15.9)
LYMPH#: 1.1 10*3/uL (ref 0.9–3.3)
LYMPH%: 36.9 % (ref 14.0–49.7)
MCH: 35.1 pg — ABNORMAL HIGH (ref 25.1–34.0)
MCHC: 34.5 g/dL (ref 31.5–36.0)
MCV: 101.6 fL — ABNORMAL HIGH (ref 79.5–101.0)
MONO#: 0.2 10*3/uL (ref 0.1–0.9)
MONO%: 6.4 % (ref 0.0–14.0)
NEUT%: 53.7 % (ref 38.4–76.8)
NEUTROS ABS: 1.6 10*3/uL (ref 1.5–6.5)
PLATELETS: 237 10*3/uL (ref 145–400)
RBC: 3.38 10*6/uL — AB (ref 3.70–5.45)
RDW: 13.6 % (ref 11.2–14.5)
WBC: 3 10*3/uL — AB (ref 3.9–10.3)

## 2016-12-28 LAB — CEA (IN HOUSE-CHCC): CEA (CHCC-In House): 104.8 ng/mL — ABNORMAL HIGH (ref 0.00–5.00)

## 2016-12-28 LAB — COMPREHENSIVE METABOLIC PANEL
ALT: 20 U/L (ref 0–55)
AST: 22 U/L (ref 5–34)
Albumin: 3.9 g/dL (ref 3.5–5.0)
Alkaline Phosphatase: 76 U/L (ref 40–150)
Anion Gap: 10 mEq/L (ref 3–11)
BUN: 12 mg/dL (ref 7.0–26.0)
CALCIUM: 9.7 mg/dL (ref 8.4–10.4)
CHLORIDE: 98 meq/L (ref 98–109)
CO2: 25 meq/L (ref 22–29)
CREATININE: 0.8 mg/dL (ref 0.6–1.1)
EGFR: 80 mL/min/{1.73_m2} — ABNORMAL LOW (ref >90)
Glucose: 96 mg/dl (ref 70–140)
Potassium: 4 mEq/L (ref 3.5–5.1)
Sodium: 134 mEq/L — ABNORMAL LOW (ref 136–145)
Total Bilirubin: 0.5 mg/dL (ref 0.20–1.20)
Total Protein: 6.5 g/dL (ref 6.4–8.3)

## 2016-12-28 MED ORDER — FULVESTRANT 250 MG/5ML IM SOLN
500.0000 mg | Freq: Once | INTRAMUSCULAR | Status: AC
  Administered 2016-12-28: 500 mg via INTRAMUSCULAR
  Filled 2016-12-28: qty 10

## 2016-12-28 NOTE — Patient Instructions (Signed)

## 2016-12-29 ENCOUNTER — Other Ambulatory Visit: Payer: Self-pay | Admitting: Oncology

## 2016-12-29 ENCOUNTER — Telehealth: Payer: Self-pay

## 2016-12-29 LAB — CANCER ANTIGEN 27.29: CA 27.29: 173.6 U/mL — ABNORMAL HIGH (ref 0.0–38.6)

## 2016-12-29 NOTE — Telephone Encounter (Signed)
ANC 1.6.  Pt instructed to start next cycle of Ibrance. Next lab appt 08/17  Per our discussion about her continued diarrhea and stool leakage Dr [name] recommends she f/u with her surgeon or, refer for radiation therapy. Pt states she does not want to do either of those things.  Pt states her GI doctor has her on arithromycin and once that is complete the plan is to have a colonoscopy.  Pt knows to call for any questions or concerns.

## 2016-12-30 ENCOUNTER — Ambulatory Visit: Payer: 59

## 2016-12-30 ENCOUNTER — Other Ambulatory Visit: Payer: 59

## 2017-01-02 NOTE — Progress Notes (Signed)
   HPI: Patient presents today with a new complaint regarding a painful lesion to the fourth interspace of the right foot. Patient denies trauma. Patient states that a blister developed with swelling to the right fifth toe proximate 2 weeks ago. Patient has been soaking her foot in Epsom salt which has helped to alleviate some pain. Patient states that the blister popped andbrown discharge. Patient presents today for further treatment and evaluation   Physical Exam: General: The patient is alert and oriented x3 in no acute distress.  Dermatology: Macerated web callus noted to the base the fourth webspace right foot consistent with a heloma molle  Vascular: Palpable pedal pulses bilaterally. No edema or erythema noted. Capillary refill within normal limits.  Neurological: Epicritic and protective threshold grossly intact bilaterally.   Musculoskeletal Exam: Range of motion within normal limits to all pedal and ankle joints bilateral. Muscle strength 5/5 in all groups bilateral.   Assessment: 1. Heloma molle fourth interspace right foot   Plan of Care:  1. Patient was evaluated. 2. Excisional debridement of the callus lesion was performed using a tissue nipper without incident or bleeding. 3. Castellani paint was applied 4. Recommend the patient purchase Castellani paint and apply daily 5. Return to clinic in 3-4 weeks   Edrick Kins, DPM [facility]  Dr. Edrick Kins, DPM    [address], [place] [address]                Office [phone]  Fax [phone]

## 2017-01-06 ENCOUNTER — Ambulatory Visit: Payer: 59 | Admitting: Podiatry

## 2017-01-16 ENCOUNTER — Ambulatory Visit (INDEPENDENT_AMBULATORY_CARE_PROVIDER_SITE_OTHER): Payer: 59 | Admitting: Podiatry

## 2017-01-16 ENCOUNTER — Encounter: Payer: Self-pay | Admitting: Podiatry

## 2017-01-16 DIAGNOSIS — L84 Corns and callosities: Secondary | ICD-10-CM

## 2017-01-17 NOTE — Progress Notes (Signed)
   HPI: Patient presents today for follow-up treatment and evaluation of a heloma molle to the fourth interspace right foot. Patient believes that it is doing much better. Patient has been applying Castellani paint daily to the interdigital webspace. Patient presents today for further treatment and evaluation   Physical Exam: General: The patient is alert and oriented x3 in no acute distress.  Dermatology: Macerated web callus noted to the base the fourth webspace right foot consistent with a heloma molle  Vascular: Palpable pedal pulses bilaterally. No edema or erythema noted. Capillary refill within normal limits.  Neurological: Epicritic and protective threshold grossly intact bilaterally.   Musculoskeletal Exam: Range of motion within normal limits to all pedal and ankle joints bilateral. Muscle strength 5/5 in all groups bilateral.   Assessment: 1. Heloma molle fourth interspace right foot-improving   Plan of Care:  1. Patient was evaluated. 2. Excisional debridement of the callus lesion was performed using a tissue nipper without incident or bleeding. 3. Castellani paint was applied 4. Continue Castellani paint daily application 5. Return to clinic in 4 weeks to discuss right great toenail pathology. Patient had nail polish on today and did not want to remove it.  Patient is going to [facility] for a few days  Edrick Kins, DPM [facility]  Dr. Edrick Kins, DPM    [address], [place] [address]                Office [phone]  Fax [phone]

## 2017-01-27 ENCOUNTER — Ambulatory Visit (HOSPITAL_BASED_OUTPATIENT_CLINIC_OR_DEPARTMENT_OTHER): Payer: 59

## 2017-01-27 ENCOUNTER — Other Ambulatory Visit (HOSPITAL_BASED_OUTPATIENT_CLINIC_OR_DEPARTMENT_OTHER): Payer: 59

## 2017-01-27 DIAGNOSIS — C7951 Secondary malignant neoplasm of bone: Secondary | ICD-10-CM

## 2017-01-27 DIAGNOSIS — C50919 Malignant neoplasm of unspecified site of unspecified female breast: Secondary | ICD-10-CM

## 2017-01-27 DIAGNOSIS — C50912 Malignant neoplasm of unspecified site of left female breast: Secondary | ICD-10-CM

## 2017-01-27 DIAGNOSIS — M810 Age-related osteoporosis without current pathological fracture: Secondary | ICD-10-CM

## 2017-01-27 DIAGNOSIS — C50312 Malignant neoplasm of lower-inner quadrant of left female breast: Secondary | ICD-10-CM | POA: Diagnosis not present

## 2017-01-27 DIAGNOSIS — Z170 Estrogen receptor positive status [ER+]: Secondary | ICD-10-CM

## 2017-01-27 DIAGNOSIS — Z5111 Encounter for antineoplastic chemotherapy: Secondary | ICD-10-CM | POA: Diagnosis not present

## 2017-01-27 DIAGNOSIS — Z1379 Encounter for other screening for genetic and chromosomal anomalies: Secondary | ICD-10-CM

## 2017-01-27 LAB — CBC WITH DIFFERENTIAL/PLATELET
BASO%: 0.8 % (ref 0.0–2.0)
Basophils Absolute: 0 10*3/uL (ref 0.0–0.1)
EOS%: 2.3 % (ref 0.0–7.0)
Eosinophils Absolute: 0.1 10*3/uL (ref 0.0–0.5)
HCT: 36.4 % (ref 34.8–46.6)
HGB: 12.7 g/dL (ref 11.6–15.9)
LYMPH%: 37.1 % (ref 14.0–49.7)
MCH: 34 pg (ref 25.1–34.0)
MCHC: 34.9 g/dL (ref 31.5–36.0)
MCV: 97.3 fL (ref 79.5–101.0)
MONO#: 0.1 10*3/uL (ref 0.1–0.9)
MONO%: 3.4 % (ref 0.0–14.0)
NEUT%: 56.4 % (ref 38.4–76.8)
NEUTROS ABS: 1.5 10*3/uL (ref 1.5–6.5)
Platelets: 159 10*3/uL (ref 145–400)
RBC: 3.74 10*6/uL (ref 3.70–5.45)
RDW: 12.5 % (ref 11.2–14.5)
WBC: 2.6 10*3/uL — AB (ref 3.9–10.3)
lymph#: 1 10*3/uL (ref 0.9–3.3)

## 2017-01-27 LAB — CEA (IN HOUSE-CHCC): CEA (CHCC-IN HOUSE): 73.42 ng/mL — AB (ref 0.00–5.00)

## 2017-01-27 MED ORDER — FULVESTRANT 250 MG/5ML IM SOLN
500.0000 mg | Freq: Once | INTRAMUSCULAR | Status: AC
  Administered 2017-01-27: 500 mg via INTRAMUSCULAR
  Filled 2017-01-27: qty 10

## 2017-01-27 NOTE — Patient Instructions (Signed)

## 2017-01-28 LAB — CANCER ANTIGEN 27.29: CA 27.29: 152 U/mL — ABNORMAL HIGH (ref 0.0–38.6)

## 2017-02-15 ENCOUNTER — Encounter: Payer: Self-pay | Admitting: Podiatry

## 2017-02-15 ENCOUNTER — Ambulatory Visit (INDEPENDENT_AMBULATORY_CARE_PROVIDER_SITE_OTHER): Payer: 59 | Admitting: Podiatry

## 2017-02-15 DIAGNOSIS — L600 Ingrowing nail: Secondary | ICD-10-CM

## 2017-02-15 DIAGNOSIS — Q828 Other specified congenital malformations of skin: Secondary | ICD-10-CM | POA: Diagnosis not present

## 2017-02-15 DIAGNOSIS — L84 Corns and callosities: Secondary | ICD-10-CM

## 2017-02-16 ENCOUNTER — Other Ambulatory Visit: Payer: Self-pay | Admitting: Oncology

## 2017-02-16 ENCOUNTER — Other Ambulatory Visit: Payer: Self-pay

## 2017-02-16 DIAGNOSIS — C50912 Malignant neoplasm of unspecified site of left female breast: Secondary | ICD-10-CM

## 2017-02-16 DIAGNOSIS — Z1379 Encounter for other screening for genetic and chromosomal anomalies: Secondary | ICD-10-CM

## 2017-02-16 DIAGNOSIS — C50312 Malignant neoplasm of lower-inner quadrant of left female breast: Secondary | ICD-10-CM

## 2017-02-16 DIAGNOSIS — M818 Other osteoporosis without current pathological fracture: Secondary | ICD-10-CM

## 2017-02-16 DIAGNOSIS — C7951 Secondary malignant neoplasm of bone: Principal | ICD-10-CM

## 2017-02-20 ENCOUNTER — Telehealth: Payer: Self-pay

## 2017-02-20 ENCOUNTER — Other Ambulatory Visit: Payer: Self-pay | Admitting: Medical Oncology

## 2017-02-20 NOTE — Telephone Encounter (Signed)
Called scheduling to request them to call pt to schedule MR pelvis and bone scan

## 2017-02-20 NOTE — Progress Notes (Signed)
   Subjective: Patient presents to the office today for chief complaint of painful callus lesions of the feet. Patient states that the pain is ongoing and is affecting their ability to ambulate without pain. She also wants the [facility] of the fourth interspace of her right foot examined. Patient presents today for further treatment and evaluation.  Objective:  Physical Exam General: Alert and oriented x3 in no acute distress  Dermatology: Hyperkeratotic lesion x 1 present on the right plantar heel. Pain on palpation with a central nucleated core noted.  Skin is warm, dry and supple bilateral lower extremities. Macerated web callus noted to the base the fourth webspace right foot consistent with a heloma molle.  Vascular: Palpable pedal pulses bilaterally. No edema or erythema noted. Capillary refill within normal limits.  Neurological: Epicritic and protective threshold grossly intact bilaterally.   Musculoskeletal Exam: Pain on palpation at the keratotic lesion noted. Range of motion within normal limits bilateral. Muscle strength 5/5 in all groups bilateral.  Assessment: #1 Heloma Molle right #2 Porokeratosis right plantar heel   Plan of Care:  #1 Patient evaluated. #2 Excisional debridement of keratoic lesion using a chisel blade was performed without incident.  #3 Treated area(s) with Salinocaine and dressed with light dressing. #4 Silicone toe caps dispensed. #5 Patient is to return to the clinic PRN.   Edrick Kins, DPM [facility]  Dr. Edrick Kins, [address]                                        [place], [place] [address]                Office [phone]  Fax [phone]

## 2017-02-21 ENCOUNTER — Other Ambulatory Visit: Payer: Self-pay | Admitting: Oncology

## 2017-02-21 DIAGNOSIS — C50312 Malignant neoplasm of lower-inner quadrant of left female breast: Secondary | ICD-10-CM

## 2017-02-22 ENCOUNTER — Telehealth: Payer: Self-pay | Admitting: *Deleted

## 2017-02-22 NOTE — Telephone Encounter (Signed)
This RN spoke with Dr [name] who does not want to delay appt past 9/14 dt he will be out of town for 3 weeks following.  Pt made aware and agreeable to keep 9/14 appts.  Per Dr [name], scans can be done after 9/14.  Pt given number to Central Scheduling dept and advised to call herself to schedule appt's

## 2017-02-22 NOTE — Telephone Encounter (Signed)
Received call from pt stating that she needs to speak with Dr Virgie Dad nurse.  She reports that she is supposed to have some scans that are not scheduled yet & she has already talked with Elmyra Ricks & Val about this.  Called Central Scheduling & was informed by [facility] that someone would call her soon.  Informed pt & spoke with Elmyra Ricks RN with Dr [name].  She will check with Dr [name] to see if pt needs to keep appt for 02/24/17 or r/s.  Pt is having a colonoscopy tomorrow & will be starting prep today.   Talked with pt again & she states she would prefer to r/s Friday's appt due to impending weather.  Message to [employer]

## 2017-02-23 ENCOUNTER — Other Ambulatory Visit (HOSPITAL_COMMUNITY)
Admission: RE | Admit: 2017-02-23 | Discharge: 2017-02-23 | Disposition: A | Discharge status: Home / Self Care | Payer: 59 | Source: Ambulatory Visit | Attending: Gastroenterology | Admitting: Gastroenterology

## 2017-02-23 ENCOUNTER — Other Ambulatory Visit: Payer: Self-pay | Admitting: Gastroenterology

## 2017-02-23 DIAGNOSIS — K624 Stenosis of anus and rectum: Secondary | ICD-10-CM | POA: Diagnosis not present

## 2017-02-23 DIAGNOSIS — Z853 Personal history of malignant neoplasm of breast: Secondary | ICD-10-CM | POA: Insufficient documentation

## 2017-02-24 ENCOUNTER — Ambulatory Visit (HOSPITAL_BASED_OUTPATIENT_CLINIC_OR_DEPARTMENT_OTHER): Payer: 59 | Admitting: Oncology

## 2017-02-24 ENCOUNTER — Ambulatory Visit (HOSPITAL_BASED_OUTPATIENT_CLINIC_OR_DEPARTMENT_OTHER): Payer: 59

## 2017-02-24 ENCOUNTER — Other Ambulatory Visit (HOSPITAL_BASED_OUTPATIENT_CLINIC_OR_DEPARTMENT_OTHER): Payer: 59

## 2017-02-24 VITALS — BP 116/68 | HR 70 | Temp 98.8°F | Resp 18 | Ht 61.0 in | Wt 106.5 lb

## 2017-02-24 DIAGNOSIS — Z170 Estrogen receptor positive status [ER+]: Secondary | ICD-10-CM | POA: Diagnosis not present

## 2017-02-24 DIAGNOSIS — C7951 Secondary malignant neoplasm of bone: Secondary | ICD-10-CM

## 2017-02-24 DIAGNOSIS — C50912 Malignant neoplasm of unspecified site of left female breast: Secondary | ICD-10-CM

## 2017-02-24 DIAGNOSIS — C50312 Malignant neoplasm of lower-inner quadrant of left female breast: Secondary | ICD-10-CM

## 2017-02-24 DIAGNOSIS — Z1379 Encounter for other screening for genetic and chromosomal anomalies: Secondary | ICD-10-CM

## 2017-02-24 DIAGNOSIS — M810 Age-related osteoporosis without current pathological fracture: Secondary | ICD-10-CM

## 2017-02-24 DIAGNOSIS — K5900 Constipation, unspecified: Secondary | ICD-10-CM | POA: Diagnosis not present

## 2017-02-24 DIAGNOSIS — C50919 Malignant neoplasm of unspecified site of unspecified female breast: Secondary | ICD-10-CM

## 2017-02-24 DIAGNOSIS — M818 Other osteoporosis without current pathological fracture: Secondary | ICD-10-CM

## 2017-02-24 DIAGNOSIS — Z5111 Encounter for antineoplastic chemotherapy: Secondary | ICD-10-CM | POA: Diagnosis not present

## 2017-02-24 LAB — CBC WITH DIFFERENTIAL/PLATELET
BASO%: 0.3 % (ref 0.0–2.0)
BASOS ABS: 0 10*3/uL (ref 0.0–0.1)
EOS%: 1.4 % (ref 0.0–7.0)
Eosinophils Absolute: 0 10*3/uL (ref 0.0–0.5)
HEMATOCRIT: 33 % — AB (ref 34.8–46.6)
HEMOGLOBIN: 11.6 g/dL (ref 11.6–15.9)
LYMPH#: 0.8 10*3/uL — AB (ref 0.9–3.3)
LYMPH%: 27.6 % (ref 14.0–49.7)
MCH: 34.5 pg — AB (ref 25.1–34.0)
MCHC: 35.2 g/dL (ref 31.5–36.0)
MCV: 98.2 fL (ref 79.5–101.0)
MONO#: 0.1 10*3/uL (ref 0.1–0.9)
MONO%: 4.4 % (ref 0.0–14.0)
NEUT#: 2 10*3/uL (ref 1.5–6.5)
NEUT%: 66.3 % (ref 38.4–76.8)
Platelets: 206 10*3/uL (ref 145–400)
RBC: 3.36 10*6/uL — ABNORMAL LOW (ref 3.70–5.45)
RDW: 13.3 % (ref 11.2–14.5)
WBC: 2.9 10*3/uL — AB (ref 3.9–10.3)

## 2017-02-24 LAB — CEA (IN HOUSE-CHCC): CEA (CHCC-In House): 62.34 ng/mL — ABNORMAL HIGH (ref 0.00–5.00)

## 2017-02-24 MED ORDER — FULVESTRANT 250 MG/5ML IM SOLN
500.0000 mg | Freq: Once | INTRAMUSCULAR | Status: AC
  Administered 2017-02-24: 500 mg via INTRAMUSCULAR
  Filled 2017-02-24: qty 10

## 2017-02-24 NOTE — Progress Notes (Signed)
ID: Sonia Hill   DOB: 1955/08/01  MR#: [id number]  IZT#:[id number]  Patient Care Team: Sonia Elbe, MD as PCP - General (Family Medicine) Sonia Hill, Sonia Dad, MD as Consulting Physician (Oncology) Sonia Posey, MD as Consulting Physician (Obstetrics and Gynecology) Sonia Spates, MD as Consulting Physician (Gastroenterology) Sonia Ruff, MD as Consulting Physician (General Surgery) Sonia Rend, MD as Consulting Physician (Endocrinology) Sonia Abrahams, RN as Registered Nurse (Oncology)   CHIEF COMPLAINT: Metastatic breast cancer, estrogen receptor positive  CURRENT TREATMENT: fulvestrant, palbociclib. Refuses bisphosphonates or denosumab  INTERVAL HISTORY: Sonia Hill returns today for follow-up of her estrogen receptor positive metastatic breast cancer accompanied by her husband. In terms of treatment Sonia Hill is receiving fulvestrant every 28 days, and palbociclib 21 days on 7 days off.  She notes that she is doing well on her treatment thus far. Pt had a colonoscopy completed on yesterday by Dr. Oletta Hill with abnormal results and she was advised to use 1 capful of Miralax TID to alleviate her symptoms. She is attributing the colonoscopy abnormalities to her lack of maintaining her nl diet which consists of juicing and eating a raw diet regularly which she hasn't done for the past two months due to previous E. coli scare. She reports that this may be the cause of the recent abnormalities on her colonoscopy. Pt was evaluated by her Integrative Doctor for her prior E. coli scare (July 2018) and treated with a 5 day course of bactrim.    REVIEW OF SYSTEMS: Pt reports associated constipation that is being treated with Miralax TID. She denies unusual headaches, visual changes, nausea, vomiting, or dizziness. There has been no unusual cough, phlegm production, or pleurisy. This been no change in bladder habits. She denies unexplained fatigue or unexplained weight loss, bleeding, rash, or  fever. A detailed review of systems today was otherwise stable.   BREAST CANCER HISTORY:  From the initial intake note:  Sonia Hill had screening mammography on 11/10/2009 showing very dense tissue with a possible distortion in the left breast.  Digital mammography on June 10th showed a 1.5 cm cluster of microcalcifications in the lower inner portion of the breast.  On physical exam, there was no palpable mass and no palpable axillary adenopathy.  There was some nipple inversion inferolaterally.  Ultrasound showed a 2.6 cm irregular mass-like area with dense posterior acoustic shadowing in the 3 o'clock position, 1 cm from the nipple, but normal appearing left axillary lymph nodes.    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  A second mass noted on ultrasound was also lobular, also low grade.  Both were ER and PR positive at well over 90%.  Both had low proliferation markers at 9%, and both were Her-2 negative by CISH with ratios of 1.6 and 1.24.  In short, both masses were nearly identical.    On June 16th, the patient had bilateral breast MRIs which showed in the left breast at 3 o'clock an irregular area of abnormal enhancement measuring up to 2.7 cm and in the left lower inner quadrant, a second post-biopsy area measuring 1.5 cm.  The difference between these two areas was stated at the conference the morning of the visit to be approximately 5 cm.  Given this data the patient opted for Left mastectomy with sentinel lymph node sampling, performed July 2011 with results as detailed below.  Subsequent history is  as detailed below.    PAST MEDICAL HISTORY: Past Medical History:  Diagnosis Date  . Arthritis    neck, upper back, shoulders - no meds - yoga  . Chronic kidney disease    Only has right kidney  . Depression    Hx - no current problem   . GERD (gastroesophageal reflux disease)    diet controlled - no meds  . Hyperlipidemia    diet controlled  . Hypothyroidism   . lt breast ca dx'd 11/2009  . Osteoporosis, unspecified 12/06/2013  . PONV (postoperative nausea and vomiting)   . SVD (spontaneous vaginal delivery)    x 1  1. Osteopenia. 2. Congenital left renal atrophy.  3. History of recurrent UTIs. 4. History of bladder reconstruction for reflux more than 18 years ago. 5. History of depression and anxiety. 6. History of hypothyroidism.  7. History of "sling procedure".  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    FAMILY HISTORY The patient's father is alive in his early 32s.  He has Alzheimer's disease. The patient's mother was diagnosed with breast cancer at the age of 57.  She died at the age of 63.  The patient's father had one out of two sisters with breast cancer.  There are other breast cancers on the mother's side.  The patient has one brother and one younger sister, neither with cancer.    GYNECOLOGIC HISTORY:  (Reviewed 12/06/2013) She is GX P1.  That pregnancy was at age 86.  Menarche was at age 78.  She was perimenopausal at the time of diagnosis but is now convincingly postmenopausal  SOCIAL HISTORY:  (Reviewed 12/06/2013) She works as a Electrical engineer, part-time and on consultation.  Her husband, Sonia Hill, is a Architect at [employer].  Sister Sonia Hill works as a Pharmacist, hospital in [place].  The patient's son Sonia Hill is  in college.  The patient attends [employer].   ADVANCED DIRECTIVES: in place  HEALTH MAINTENANCE: (Updated 12/06/2013)  Social History  Substance Use Topics  . Smoking status: Never Smoker  . Smokeless tobacco: Never Used  . Alcohol use No     Colonoscopy: 06/18/2015; [facility]  PAP: UTD/May 2015, Dr. Matthew Hill  Bone density: Sonia Hill 2014, osteoporosis  Lipid panel: UTD (Not on file)    Allergies   Allergen Reactions  . Adhesive [Tape] Rash    Ok to use paper tape and tegaderm over IV site  . Betadine [Povidone Iodine] Rash  . Mercury Rash    Reaction mercurachrome  . Sulfa Antibiotics Rash    Current Outpatient Prescriptions  Medication Sig Dispense Refill  . ascorbic acid (VITAMIN C) 1000 MG tablet Take 1,000 mg by mouth 3 (three) times daily.     . Ashwagandha 125 MG CAPS Take 125 mg by mouth daily.     . Cholecalciferol (VITAMIN D-3) 5000 UNITS TABS Take 5,000 Units by mouth daily.     Marland Kitchen docusate sodium (COLACE) 250 MG capsule Take 1 capsule (250 mg total) by mouth daily. 10 capsule 0  . FOLIC ACID PO Take 0,092 mcg by mouth daily.     Leslee Home 100 MG capsule TAKE ONE CAPSULE (100 MG) BY MOUTH DAILY WITH FOOD FOR 21 DAYS OF A 28DAY CYCLE.  SWALLOW WHOLE.  DO NOT TAKE IF CAPSULES ARE BROKEN OR CRA 21 capsule 6  . MAGNESIUM PO Take 325 mg by mouth at bedtime as needed (leg cramps). Magnesium calm -  powder    . Multiple Vitamin (MULTIVITAMIN WITH MINERALS) TABS tablet Take 1 tablet by mouth daily.    . Multiple Vitamins-Minerals (HAIR/SKIN/NAILS/BIOTIN) TABS Take 1 tablet by mouth daily.    . niacin 250 MG tablet Take 250 mg by mouth at bedtime.    . Omega-3 Fatty Acids (FISH OIL) 1200 MG CAPS Take 1,200 mg by mouth 2 (two) times daily.    Marland Kitchen OVER THE COUNTER MEDICATION Take 12.5 mg by mouth daily. Iodoral Iodine  supplement    . Probiotic Product (PROBIOTIC DAILY PO) Take 1 tablet by mouth daily.     Marland Kitchen thyroid (NATURE-THROID) 65 MG tablet Take 81.25 mg by mouth daily.     No current facility-administered medications for this visit.     OBJECTIVE: Middle aged white Hill Who appears stated age  61:   02/24/17 0942  BP: 116/68  Pulse: 70  Resp: 18  Temp: 98.8 F (37.1 C)  SpO2: 100%     Body mass index is 20.12 kg/m.    ECOG FS: 1 Filed Weights   02/24/17 0942  Weight: 106 lb 8 oz (48.3 kg)   Sclerae unicteric, EOMs intact Oropharynx clear and moist No  cervical or supraclavicular adenopathy Lungs no rales or rhonchi Heart regular rate and rhythm Abd soft, nontender, positive bowel sounds MSK no focal spinal tenderness, no upper extremity lymphedema Neuro: nonfocal, well oriented, appropriate affect Breasts: Deferred   LAB RESULTS:   Lab Results  Component Value Date   WBC 2.9 (L) 02/24/2017   NEUTROABS 2.0 02/24/2017   HGB 11.6 02/24/2017   HCT 33.0 (L) 02/24/2017   MCV 98.2 02/24/2017   PLT 206 02/24/2017      Chemistry      Component Value Date/Time   NA 134 (L) 12/28/2016 1503   K 4.0 12/28/2016 1503   CL 98 12/03/2012 1419   CO2 25 12/28/2016 1503   BUN 12.0 12/28/2016 1503   CREATININE 0.8 12/28/2016 1503      Component Value Date/Time   CALCIUM 9.7 12/28/2016 1503   ALKPHOS 76 12/28/2016 1503   AST 22 12/28/2016 1503   ALT 20 12/28/2016 1503   BILITOT 0.50 12/28/2016 1503      Lab Results  Component Value Date   LABCA2 181 (H) 10/23/2015   STUDIES: Colonoscopy results discussed at length today  She is due for repeat bone scan and pelvic MRI 03/10/2017  ASSESSMENT: 61 y.o.  BRCA 1-2 negative Sonia Hill with lobular breast cancer stage IV at presentation July 2011  (1) status post left mastectomy and sentinel lymph node sampling in July 2011 for a lower inner quadrant T1 N1 M1, stage IV invasive lobular breast cancer, grade 1, strongly estrogen and progesterone receptor-positive, HER2 negative with MIB-1 of 9% and no HER2 amplification,   (2) with multiple sclerotic bone lesions at presentation seen only on CT scan (not on bone scan or PET scan), but  with biopsy-proven metastatic disease to bone; and an elevated CA 27.29 at presentation,   (3) Oncotype recurrence score of 4, predicts a good response to antiestrogens.  (4) Systemic treatment has consisted of  a) tamoxifen with evidence of response but poor tolerance  b) letrozole starting August 2012, discontinued October 2014 per patient   (5)  single functioning kidney  (6) status post bilateral salpingo-oophorectomy 01/24/2013, with benign pathology  (7) osteoporosis; the patient refuses bisphosphonate therapy; started [facility] February 2014.    (a) bone density was obtained under the care  of Dr. Matthew Hill at [facility], 06/25/2012, showing osteoporosis with a T score -2.6. This was repeated 12/20/2013, showing again osteoporosis with T-scores between -2.4 and -2.8.  (b) the patient refuses zolendronate, denosumab, or other pharmacologic intervention  (8) the patient refuses standard Mammography or tomography; undergoing thermography screening of the right breast.  (9) PET scan 06/05/2015 shows rectal thickening and a presacral mass; biopsy of this area 10/09/2015 confirms metastatic lobular breast cancer, again estrogen receptor positive, HER-2 nonamplified.  (a) pelvic MRI 03/11/2016 confirms stability of disease  (b) chest CT and pelvic MRI 09/02/2016 shows no change in the circumferential rectal thickening or evidence of extension beyond the serosa and chest CT findings  (c) colonoscopy 02/23/2017 showed a very narrow rectal lumen with a few apparently uninvolved centimeters distally   (10) started fulvestrant and palbociclib 125 mg/ day [21/7] May 2017  (a) palbociclib dose decreased to 100 mg daily [21/7] with second cycle, started 11/25/2015   PLAN:  I spent approximately 30 minutes with Sonia Hill with most of that time spent discussing her complex problems. Taken all in all, she is now 7 years out from diagnosis of metastatic breast cancer, with a very good functional status. She does not have significant problems with pain, or excessive fatigue. In fact she continues to work, although now part time.  By staging studies, her cancer is stable, and her CA-27-29 continues to drop. Further she is tolerating the fulvestrant and palbociclib well.  She understands I have suggested either zolendronate or  denosumab for her bone lesions but at this point she continues to demur  The main problem she is having is a very narrow rectal lumen, with significant accumulation of material proximal to that. Dr. Oletta Hill after the procedure just done suggested that she essentially eats no fiber and take MiraLAX 3 times a day. She feels on the contrary that should because she has not been eating enough fiber as she is having the current problem. Her plan is to go back to a more the given diet and juicing.  We discussed the option of having a stent placed in the rectum to increase the lumen, which would hopefully alleviate this problem. The ultimate solution may be resection of this area with colostomy.  I'm hopeful Anely use plan will work. If it does not she will give Dr. Oletta Hill suggestion a try.  She is already scheduled for bone scan and repeat MRI of the pelvis later this month. She understands I will not be able to call her immediately with those results since I will be out of town but she can contact my nurse to get at least preliminary results until I get back.  We are continuing the fulvestrant every 28 days, without next dose due October 12, and the elbow 6 clip at 100 mg 21 days on and 7 days off as before  She knows to call for any problems that may develop before the next visit here.    Birch Farino, Sonia Dad, MD  02/25/17 5:05 PM Medical Oncology and Hematology [facility] [address], [place] [address] Tel. [phone]    Fax. [phone]  This document serves as a record of services personally performed by Lurline Del, MD. It was created on her behalf by Steva Colder, a trained medical scribe. The creation of this record is based on the scribe's personal observations and the provider's statements to them. This document has been checked and approved by the attending provider.

## 2017-02-24 NOTE — Patient Instructions (Signed)

## 2017-02-27 LAB — CANCER ANTIGEN 27.29: CAN 27.29: 161.3 U/mL — AB (ref 0.0–38.6)

## 2017-03-01 ENCOUNTER — Other Ambulatory Visit: Payer: Self-pay | Admitting: Oncology

## 2017-03-01 MED ORDER — PALBOCICLIB 125 MG PO CAPS: 125.0000 mg | ORAL_CAPSULE | Freq: Every day | ORAL | 6 refills | Status: DC

## 2017-03-06 ENCOUNTER — Other Ambulatory Visit: Payer: Self-pay | Admitting: *Deleted

## 2017-03-06 ENCOUNTER — Telehealth: Payer: Self-pay

## 2017-03-06 DIAGNOSIS — C50919 Malignant neoplasm of unspecified site of unspecified female breast: Secondary | ICD-10-CM

## 2017-03-06 DIAGNOSIS — C7951 Secondary malignant neoplasm of bone: Principal | ICD-10-CM

## 2017-03-06 MED ORDER — PALBOCICLIB 125 MG PO CAPS: 125.0000 mg | ORAL_CAPSULE | Freq: Every day | ORAL | 6 refills | Status: DC

## 2017-03-06 NOTE — Telephone Encounter (Signed)
Late entry:  Pt called on 03/01/17 to ask if she could go back to taking 125 mg of Ibrance.  Per Dr [name] pt may increase dose to 125mg  on 21 days off 7.

## 2017-03-07 ENCOUNTER — Telehealth: Payer: Self-pay | Admitting: Oncology

## 2017-03-07 NOTE — Telephone Encounter (Signed)
Left message with patient regarding her upcoming appointments. Added per 0/19 sch msg.

## 2017-03-10 ENCOUNTER — Ambulatory Visit (HOSPITAL_COMMUNITY)
Admission: RE | Admit: 2017-03-10 | Discharge: 2017-03-10 | Disposition: A | Discharge status: Home / Self Care | Payer: 59 | Source: Ambulatory Visit | Attending: Hematology and Oncology | Admitting: Hematology and Oncology

## 2017-03-10 ENCOUNTER — Ambulatory Visit (HOSPITAL_COMMUNITY)
Admission: RE | Admit: 2017-03-10 | Discharge: 2017-03-10 | Disposition: A | Discharge status: Home / Self Care | Payer: 59 | Source: Ambulatory Visit | Attending: Oncology | Admitting: Oncology

## 2017-03-10 ENCOUNTER — Ambulatory Visit (HOSPITAL_BASED_OUTPATIENT_CLINIC_OR_DEPARTMENT_OTHER): Payer: 59

## 2017-03-10 ENCOUNTER — Other Ambulatory Visit: Payer: Self-pay | Admitting: *Deleted

## 2017-03-10 DIAGNOSIS — C50312 Malignant neoplasm of lower-inner quadrant of left female breast: Secondary | ICD-10-CM | POA: Diagnosis not present

## 2017-03-10 DIAGNOSIS — R938 Abnormal findings on diagnostic imaging of other specified body structures: Secondary | ICD-10-CM | POA: Diagnosis not present

## 2017-03-10 DIAGNOSIS — C7951 Secondary malignant neoplasm of bone: Secondary | ICD-10-CM | POA: Insufficient documentation

## 2017-03-10 DIAGNOSIS — Z170 Estrogen receptor positive status [ER+]: Secondary | ICD-10-CM | POA: Diagnosis not present

## 2017-03-10 DIAGNOSIS — C50912 Malignant neoplasm of unspecified site of left female breast: Secondary | ICD-10-CM

## 2017-03-10 LAB — POCT I-STAT CREATININE: Creatinine, Ser: 0.8 mg/dL (ref 0.44–1.00)

## 2017-03-10 MED ORDER — TECHNETIUM TC 99M MEDRONATE IV KIT
20.3000 | PACK | Freq: Once | INTRAVENOUS | Status: AC | PRN
  Administered 2017-03-10: 20.3 via INTRAVENOUS

## 2017-03-10 MED ORDER — GADOBENATE DIMEGLUMINE 529 MG/ML IV SOLN
10.0000 mL | Freq: Once | INTRAVENOUS | Status: AC | PRN
  Administered 2017-03-10: 10 mL via INTRAVENOUS

## 2017-03-16 ENCOUNTER — Other Ambulatory Visit: Payer: Self-pay | Admitting: *Deleted

## 2017-03-16 ENCOUNTER — Telehealth: Payer: Self-pay | Admitting: *Deleted

## 2017-03-16 ENCOUNTER — Ambulatory Visit (HOSPITAL_COMMUNITY)
Admission: RE | Admit: 2017-03-16 | Discharge: 2017-03-16 | Disposition: A | Discharge status: Home / Self Care | Payer: 59 | Source: Ambulatory Visit | Attending: Oncology | Admitting: Oncology

## 2017-03-16 DIAGNOSIS — K5641 Fecal impaction: Secondary | ICD-10-CM | POA: Diagnosis not present

## 2017-03-16 DIAGNOSIS — K629 Disease of anus and rectum, unspecified: Secondary | ICD-10-CM | POA: Diagnosis present

## 2017-03-16 DIAGNOSIS — C7951 Secondary malignant neoplasm of bone: Secondary | ICD-10-CM | POA: Diagnosis present

## 2017-03-16 DIAGNOSIS — C50912 Malignant neoplasm of unspecified site of left female breast: Secondary | ICD-10-CM

## 2017-03-16 DIAGNOSIS — K6289 Other specified diseases of anus and rectum: Secondary | ICD-10-CM

## 2017-03-16 NOTE — Telephone Encounter (Signed)
This RN received printed communication per [facility] stating pt called pm 10/3 with concerns of bowel changes and requesting results of MRI done 9/28.  This RN called pt and discussed area of concern in rectum is enlarging.  Sonia Hill states she is having bowel changes " I find that I do not have the the muscle sense to evacuate my bowels- if I push I only end up with discomfort" " I find that if I just sit and do relaxation breathing like in my Yoga- I can pass some stool and I just don't try to push for more then what comes"  Sonia Hill is having oozing of stool requiring protective pads.  She has managed discomfort with CBD oil with good benefit.  Due to above bowel changes - [place] initiated a liquid diet including " bone broth and juicing ".  She states mild nausea developed yesterday which is new for her - and she is concerned if her bowels could be blocked or there is stool she needs to pass.  Per discussion- pt will obtain a KUB today and then see this RN for results.  She understands if KUB is abnormal she will need to be seen by a provider for further evaluation.  Sonia Hill verbalized understanding - KUB order entered.

## 2017-03-17 ENCOUNTER — Telehealth: Payer: Self-pay | Admitting: *Deleted

## 2017-03-17 NOTE — Telephone Encounter (Signed)
This RN spoke with pt per KUB obtained late yesterday afternoon.  Per phone discussion today Sonia Hill states she has not been able to " keep up my bowel regimen as aggressively this week because I have been working full time "  Note Sonia Hill states she had a recent sigmoidoscopy with a biopsy " which was benign " - "so the mass in my rectal area is not in the bowel but pressing against the rectal area "  She is having incontinence and when she does sit on the commode to pass stool it is either watery or  in a pencil size form.  She is not having discomfort, nor focal pain and only mild nausea.   Sonia Hill has for the past 2 days maintained a liquid diet and will continue over the weekend - She is not at work today and is doing a bowel cleansing regimen orally ( smooth move tea and juices ) for bowel evacuation.  Per this phone discussion Sonia Hill understands concerns for obstruction and if she develops abd pain, distention and or vomiting she needs to proceed to the ER for possible SBO concerns.  This note will be shown to covering MD for review and any further recommendations.

## 2017-03-19 ENCOUNTER — Other Ambulatory Visit: Payer: Self-pay | Admitting: Oncology

## 2017-03-20 ENCOUNTER — Ambulatory Visit (HOSPITAL_BASED_OUTPATIENT_CLINIC_OR_DEPARTMENT_OTHER): Payer: 59 | Admitting: Oncology

## 2017-03-20 ENCOUNTER — Other Ambulatory Visit: Payer: Self-pay | Admitting: Oncology

## 2017-03-20 VITALS — BP 135/70 | HR 71 | Temp 98.7°F | Resp 18 | Ht 61.0 in | Wt 104.8 lb

## 2017-03-20 DIAGNOSIS — C7951 Secondary malignant neoplasm of bone: Secondary | ICD-10-CM

## 2017-03-20 DIAGNOSIS — K5900 Constipation, unspecified: Secondary | ICD-10-CM | POA: Diagnosis not present

## 2017-03-20 DIAGNOSIS — C50312 Malignant neoplasm of lower-inner quadrant of left female breast: Secondary | ICD-10-CM | POA: Diagnosis not present

## 2017-03-20 DIAGNOSIS — M810 Age-related osteoporosis without current pathological fracture: Secondary | ICD-10-CM

## 2017-03-20 DIAGNOSIS — R194 Change in bowel habit: Secondary | ICD-10-CM | POA: Diagnosis not present

## 2017-03-20 DIAGNOSIS — C50919 Malignant neoplasm of unspecified site of unspecified female breast: Secondary | ICD-10-CM

## 2017-03-20 DIAGNOSIS — Z170 Estrogen receptor positive status [ER+]: Secondary | ICD-10-CM

## 2017-03-20 NOTE — Progress Notes (Signed)
ID: Sonia Hill   DOB: March 05, 1956  MR#: [id number]  FIE#:[id number]  Patient Care Team: [name], MD as PCP - General (Family Medicine) Magrinat, Virgie Dad, MD as Consulting Physician (Oncology) Molli Posey, MD as Consulting Physician (Obstetrics and Gynecology) Laurence Spates, MD as Consulting Physician (Gastroenterology) Leighton Ruff, MD as Consulting Physician (General Surgery) Delrae Rend, MD as Consulting Physician (Endocrinology) Laureen Abrahams, RN as Registered Nurse (Oncology) Clarene Essex, MD as Consulting Physician (Gastroenterology)   CHIEF COMPLAINT: Metastatic breast cancer, estrogen receptor positive  CURRENT TREATMENT: fulvestrant, palbociclib. 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 She is accompanied by her husband. She continues on fulvestrant and palbociclib, generally with good tolerance, however we have some evidence of progression. Specifically the MRI of the pelvis obtained 03/10/2017 finds progressive wall thickening involving the rectum, all the way to the level of the anal verge. There was also mild increase in enhancing presacral soft tissue. The rectal wall thickness now measures 1.3 cm as compared to 0.9 cm previously.  Note that her CA-27-29 and CEA both are if anything trending slightly down or are stable. She understands this is not reliable  The progression is reflected in her symptomatology. She has either watery stools or pencil sized stools. She is working strenuously to keep her diet to liquid, and she is trying to continue to work. She did a bowel cleaning regimen on 03/17/2017. My concern however is that if we do not intervene soon I think she is going to have complete blockage.  She is here today to discuss her options.   REVIEW OF SYSTEMS: Yamil reports gradually worsening constipation x 10 days worsening 3 days ago.  She has had mucous like bowel movements for the past 10 days, but recently had a solid bowel movement on yesterday. She typically has 3-4 bowel movements daily. She has had lower abdominal pain and discomfort, rectal pain, abdominal bloating, and nausea. She consumes miralax, smooth move tea, and approximately 50 ounces of water daily. She denies unusual headaches, visual changes, vomiting, or dizziness. There has been no unusual cough, phlegm production, or pleurisy. This been no change in bowel or bladder habits. She denies unexplained fatigue or unexplained weight loss, bleeding, rash, or fever. A detailed review of systems was otherwise stable    BREAST CANCER HISTORY:  From the initial intake note:  Nellie had screening mammography on 11/10/2009 showing very dense tissue with a possible distortion in the left breast.  Digital mammography on June 10th showed a 1.5 cm cluster of microcalcifications in the lower inner portion of the breast.  On physical exam, there was no palpable mass and no palpable axillary adenopathy.  There was some nipple inversion inferolaterally.  Ultrasound showed a 2.6 cm irregular mass-like area with dense posterior acoustic shadowing in the 3 o'clock position, 1 cm from the nipple, but normal appearing left axillary lymph nodes.    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  A second mass noted on ultrasound was also lobular, also low grade.  Both were ER and PR positive at well over 90%.  Both had low proliferation markers at 9%, and both were Her-2 negative by CISH with ratios of 1.6 and 1.24.  In short, both masses were nearly identical.    On June  16th, the patient had bilateral breast MRIs which showed in the left breast at 3 o'clock an irregular area of abnormal enhancement measuring up to 2.7 cm and in the left lower  inner quadrant, a second post-biopsy area measuring 1.5 cm.  The difference between these two areas was stated at the conference the morning of the visit to be approximately 5 cm.  Given this data the patient opted for Left mastectomy with sentinel lymph node sampling, performed July 2011 with results as detailed below.  Subsequent history is as detailed below.    PAST MEDICAL HISTORY: Past Medical History:  Diagnosis Date  . Arthritis    neck, upper back, shoulders - no meds - yoga  . Chronic kidney disease    Only has right kidney  . Depression    Hx - no current problem  . GERD (gastroesophageal reflux disease)    diet controlled - no meds  . Hyperlipidemia    diet controlled  . Hypothyroidism   . lt breast ca dx'd 11/2009  . Osteoporosis, unspecified 12/06/2013  . PONV (postoperative nausea and vomiting)   . SVD (spontaneous vaginal delivery)    x 1  1. Osteopenia. 2. Congenital left renal atrophy.  3. History of recurrent UTIs. 4. History of bladder reconstruction for reflux more than 18 years ago. 5. History of depression and anxiety. 6. History of hypothyroidism.  7. History of "sling procedure".  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    FAMILY HISTORY The patient's father is alive in his early 33s.  He has Alzheimer's disease. The patient's mother was diagnosed with breast cancer at the age of 68.  She died at the age of 100.  The patient's father had one out of two sisters with breast cancer.  There are other breast cancers on the mother's side.  The patient has one brother and one younger sister, neither with cancer.    GYNECOLOGIC HISTORY:  (Reviewed 12/06/2013) She is GX P1.  That pregnancy was at age 13.  Menarche was at age 25.  She was perimenopausal at the time of diagnosis but is now convincingly postmenopausal  SOCIAL HISTORY:  (Reviewed 12/06/2013) She works as a Electrical engineer, part-time  and on consultation.  Her husband, [name], is a Architect at [employer].  [name] works as a Pharmacist, hospital in [place].  The patient's son [name] is  in college.  The patient attends [employer].   ADVANCED DIRECTIVES: in place  HEALTH MAINTENANCE: (Updated 12/06/2013)  Social History  Substance Use Topics  . Smoking status: Never Smoker  . Smokeless tobacco: Never Used  . Alcohol use No     Colonoscopy: 06/18/2015; [facility]  PAP: UTD/May 2015, Dr. Matthew Saras  Bone density: [name] 2014, osteoporosis  Lipid panel: UTD (Not on file)    Allergies  Allergen Reactions  . Adhesive [Tape] Rash    Ok to use paper tape and tegaderm over IV site  . Betadine [Povidone Iodine] Rash  . Mercury Rash    Reaction mercurachrome  . Sulfa Antibiotics Rash    Current Outpatient Prescriptions  Medication Sig Dispense Refill  . ascorbic acid (VITAMIN C) 1000 MG tablet Take 1,000 mg by mouth 3 (three) times daily.     . Ashwagandha 125 MG CAPS Take 125 mg by mouth daily.     . Cholecalciferol (VITAMIN D-3) 5000 UNITS TABS Take 5,000 Units by mouth daily.     Marland Kitchen  docusate sodium (COLACE) 250 MG capsule Take 1 capsule (250 mg total) by mouth daily. 10 capsule 0  . FOLIC ACID PO Take 8,676 mcg by mouth daily.     Marland Kitchen MAGNESIUM PO Take 325 mg by mouth at bedtime as needed (leg cramps). Magnesium calm - powder    . Multiple Vitamin (MULTIVITAMIN WITH MINERALS) TABS tablet Take 1 tablet by mouth daily.    . Multiple Vitamins-Minerals (HAIR/SKIN/NAILS/BIOTIN) TABS Take 1 tablet by mouth daily.    . niacin 250 MG tablet Take 250 mg by mouth at bedtime.    . Omega-3 Fatty Acids (FISH OIL) 1200 MG CAPS Take 1,200 mg by mouth 2 (two) times daily.    Marland Kitchen OVER THE COUNTER MEDICATION Take 12.5 mg by mouth daily. Iodoral Iodine  supplement    . palbociclib (IBRANCE) 125 MG capsule Take 1 capsule (125 mg total) by mouth daily with breakfast. Take whole with food.  Take 1 capsule daily for 21 days  ON,   7 days  OFF. 21 capsule 6  . Probiotic Product (PROBIOTIC DAILY PO) Take 1 tablet by mouth daily.     Marland Kitchen thyroid (NATURE-THROID) 65 MG tablet Take 81.25 mg by mouth daily.     No current facility-administered medications for this visit.     OBJECTIVE: Middle aged white woman  Vitals:   03/20/17 1548  BP: 135/70  Pulse: 71  Resp: 18  Temp: 98.7 F (37.1 C)  SpO2: 100%     Body mass index is 19.8 kg/m.    ECOG FS: 1 Filed Weights   03/20/17 1548  Weight: 104 lb 12.8 oz (47.5 kg)   Sclerae unicteric, pupils round and equal Oropharynx clear and moist No cervical or supraclavicular adenopathy Lungs no rales or rhonchi Heart regular rate and rhythm Abd soft, nontender, positive bowel sounds, no masses palpated MSK no focal spinal tenderness, no upper extremity lymphedema Neuro: nonfocal, well oriented, appropriate affect Breasts: Deferred    LAB RESULTS:   Lab Results  Component Value Date   WBC 2.9 (L) 02/24/2017   NEUTROABS 2.0 02/24/2017   HGB 11.6 02/24/2017   HCT 33.0 (L) 02/24/2017   MCV 98.2 02/24/2017   PLT 206 02/24/2017      Chemistry      Component Value Date/Time   NA 134 (L) 12/28/2016 1503   K 4.0 12/28/2016 1503   CL 98 12/03/2012 1419   CO2 25 12/28/2016 1503   BUN 12.0 12/28/2016 1503   CREATININE 0.80 03/10/2017 1517   CREATININE 0.8 12/28/2016 1503      Component Value Date/Time   CALCIUM 9.7 12/28/2016 1503   ALKPHOS 76 12/28/2016 1503   AST 22 12/28/2016 1503   ALT 20 12/28/2016 1503   BILITOT 0.50 12/28/2016 1503      Lab Results  Component Value Date   LABCA2 181 (H) 10/23/2015   STUDIES: Dg Abd 1 View  Result Date: 03/16/2017 CLINICAL DATA:  Breast carcinoma with known rectal mass.  Nausea. EXAM: ABDOMEN - 1 VIEW COMPARISON:  MRI pelvis March 10, 2017 FINDINGS: There is fairly diffuse stool throughout the colon. Rectum is mildly distended with stool. There is no bowel dilatation or air-fluid level to suggest bowel  obstruction. No free air. No bone lesions are appreciable. Seen. IMPRESSION: Fairly diffuse stool throughout colon with rectum mildly distended with stool. No bowel obstruction or free air. Electronically Signed   By: Lowella Grip III M.D.   On: 03/16/2017 16:47   Mr Pelvis  W Wo Contrast  Result Date: 03/10/2017 CLINICAL DATA:  Followup rectal/ presacral metastasis. Stage IV lobular breast cancer EXAM: MRI PELVIS WITHOUT AND WITH CONTRAST TECHNIQUE: Multiplanar multisequence MR imaging of the pelvis was performed both before and after administration of intravenous contrast. CONTRAST:  74m MULTIHANCE GADOBENATE DIMEGLUMINE 529 MG/ML IV SOLN COMPARISON:  None. FINDINGS: Urinary Tract:  No abnormality visualized. Bowel: Abnormal circumferential wall thickening involving the rectum to the level of the anal verge is again identified. This appears progressive when compared with previous exam. Single wall thickness now measures up to 1.3 cm, image 21 of series 6 and image 8 of series 12. Previously 0.9 cm. Vascular/Lymphatic: No pelvic adenopathy. Similar appearance of ovoid fluid collection along the left iliac fossa measuring 1.4 cm, image 9 of series 11. This may represent a small lymphocele or seroma peer Reproductive:  No mass or other significant abnormality Other: Small mild amount of free fluid is identified within the pelvis. Stable to increased in the interval.There has been interval increase in enhancing presacral soft tissue which now measures 9 mm in thickness, image 20 of series 13. Previously 0.5 cm. Musculoskeletal: No enhancing bone lesions identified. IMPRESSION: 1. Persistent and progressive wall thickening involving the rectum up to the level of the anal verge. 2. Mild increase in enhancing presacral soft tissue. 3. No pelvic adenopathy identified. Electronically Signed   By: TKerby MoorsM.D.   On: 03/10/2017 16:24   Nm Bone Scan Whole Body  Result Date: 03/10/2017 CLINICAL DATA:   History of metastatic breast cancer. EXAM: NUCLEAR MEDICINE WHOLE BODY BONE SCAN TECHNIQUE: Whole body anterior and posterior images were obtained approximately 3 hours after intravenous injection of radiopharmaceutical. RADIOPHARMACEUTICALS:  20.3 mCi Technetium-980mDP IV COMPARISON:  Bone scan of September 09, 2013. FINDINGS: Abnormal uptake is seen in both ankles consistent with degenerative change. Mildly increased uptake is seen involving both knees consistent with degenerative change. Abnormal uptake is seen involving the left side of the lower lumbar spine at L4-5 level most consistent with degenerative change. No other areas of abnormal uptake are noted. IMPRESSION: Probable degenerative changes noted in both ankles and knees. Abnormal focus of uptake is seen involving the left side of the lumbar spine at L4-5 which may represent degenerative change; correlation with radiographs is recommended to exclude the possibility of metastatic disease. No other areas of abnormal uptake are noted. Electronically Signed   By: JaMarijo ConceptionM.D.   On: 03/10/2017 15:23     ASSESSMENT: 6243.o.  [facility] with lobular breast cancer stage IV at presentation July 2011  (1) status post left mastectomy and sentinel lymph node sampling in July 2011 for a lower inner quadrant T1 N1 M1, stage IV invasive lobular breast cancer, grade 1, strongly estrogen and progesterone receptor-positive, HER2 negative with MIB-1 of 9% and no HER2 amplification,   (2) with multiple sclerotic bone lesions at presentation seen only on CT scan (not on bone scan or PET scan), but  with biopsy-proven metastatic disease to bone; and an elevated CA 27.29 at presentation,   (3) Oncotype recurrence score of 4, predicts a good response to antiestrogens.  (4) Systemic treatment has consisted of  a) tamoxifen with evidence of response but poor tolerance  b) letrozole starting August 2012, discontinued October 2014 per  patient   (5) single functioning kidney  (6) status post bilateral salpingo-oophorectomy 01/24/2013, with benign pathology  (7) osteoporosis; the patient refuses bisphosphonate therapy; [facility]    (a) bone density was obtained under the care of Dr. Matthew Saras at [facility], 06/25/2012, showing osteoporosis with a T score -2.6. This was repeated 12/20/2013, showing again osteoporosis with T-scores between -2.4 and -2.8.  (b) the patient refuses zolendronate, denosumab, or other pharmacologic intervention  (8) the patient refuses standard Mammography or tomography; undergoing thermography screening of the right breast.  (9) PET scan 06/05/2015 shows rectal thickening and a presacral mass; biopsy of this area 10/09/2015 confirms metastatic lobular breast cancer, again estrogen receptor positive, HER-2 nonamplified.  (a) pelvic MRI 03/11/2016 confirms stability of disease  (b) chest CT and pelvic MRI 09/02/2016 shows no change in the circumferential rectal thickening or evidence of extension beyond the serosa and chest CT findings  (c) colonoscopy 02/23/2017 showed a very narrow rectal lumen with a few apparently uninvolved centimeters distally   (10) started fulvestrant and palbociclib 125 mg/ day [21/7] May 2017  (a) palbociclib dose decreased to 100 mg daily [21/7] with second cycle, started 11/25/2015  (b) palbociclib discontinued 03/20/2017 with evidence of disease progression   PLAN:  I spent approximately one hour today with Matasha and her husband going over her situation. She has been able to continue working on told just a week ago, but I don't think that is going to be possible. She is spending all her time just trying to have a bowel movement and she is barely able to do any emptying. She is likely to be completely blocked very soon.  We discussed the possibility of a stent, which I think might at best provide temporary relief and might  be accompanied by complications.  I told her in my opinion she needs to have surgery and a colostomy. I don't believe with involvement of the anal verge area she could be reconnected, perhaps ever. She could be debulked and then we could consider pelvic radiation postop as appropriate.  We also discussed changing her treatment to everolimus/exemestane. I think this would be a good move but I would not expect it to be effective for perhaps 2 months, and we certainly don't have that long.  She had been followed previously by Dr. Marcello Moores but Almyra Free feels her meeting with Dr. Marcello Moores was discouraging. I told her Dr. Marcello Moores is very much in the right surgeon for her, extremely experienced in this type of problem Marcello Moores but Theo would prefer a second opinion and specifically is requesting Dr. Fanny Skates. I will operational eyes that if possible.  I suggest that she will need at least temporary disability. She will consider that.  She is ready scheduled for her next fulvestrant October 12. I would keep that appointment and also the next 1 which is in early November. Hopefully she will have recovered from her surgery by then and we can consider switching to a different set of anti-estrogens  She knows to call for any problems that may develop before her next visit here.      Magrinat, Virgie Dad, MD  03/20/17 3:52 PM Medical Oncology and Hematology [facility] [address] [place], [place] [address] Tel. [phone]    Fax. [phone]  This document serves as a record of services personally performed by Lurline Del, MD. It was created on her behalf by Steva Colder, a trained medical scribe. The creation of this record is based on the scribe's personal observations and the provider's statements to them. This document has been checked and approved by the attending provider.

## 2017-03-21 ENCOUNTER — Telehealth: Payer: Self-pay | Admitting: Oncology

## 2017-03-21 NOTE — Telephone Encounter (Signed)
Per 10/8 - no los at checkout °

## 2017-03-24 ENCOUNTER — Ambulatory Visit (HOSPITAL_BASED_OUTPATIENT_CLINIC_OR_DEPARTMENT_OTHER): Payer: 59

## 2017-03-24 ENCOUNTER — Other Ambulatory Visit (HOSPITAL_BASED_OUTPATIENT_CLINIC_OR_DEPARTMENT_OTHER): Payer: 59

## 2017-03-24 DIAGNOSIS — Z1379 Encounter for other screening for genetic and chromosomal anomalies: Secondary | ICD-10-CM

## 2017-03-24 DIAGNOSIS — M810 Age-related osteoporosis without current pathological fracture: Secondary | ICD-10-CM

## 2017-03-24 DIAGNOSIS — C50312 Malignant neoplasm of lower-inner quadrant of left female breast: Secondary | ICD-10-CM

## 2017-03-24 DIAGNOSIS — Z170 Estrogen receptor positive status [ER+]: Secondary | ICD-10-CM

## 2017-03-24 DIAGNOSIS — C50912 Malignant neoplasm of unspecified site of left female breast: Secondary | ICD-10-CM

## 2017-03-24 DIAGNOSIS — C50919 Malignant neoplasm of unspecified site of unspecified female breast: Secondary | ICD-10-CM

## 2017-03-24 DIAGNOSIS — C7951 Secondary malignant neoplasm of bone: Secondary | ICD-10-CM

## 2017-03-24 DIAGNOSIS — Z5111 Encounter for antineoplastic chemotherapy: Secondary | ICD-10-CM | POA: Diagnosis not present

## 2017-03-24 LAB — CBC WITH DIFFERENTIAL/PLATELET
BASO%: 1.1 % (ref 0.0–2.0)
BASOS ABS: 0 10*3/uL (ref 0.0–0.1)
EOS ABS: 0.1 10*3/uL (ref 0.0–0.5)
EOS%: 2.6 % (ref 0.0–7.0)
HEMATOCRIT: 35.8 % (ref 34.8–46.6)
HEMOGLOBIN: 12.4 g/dL (ref 11.6–15.9)
LYMPH#: 0.7 10*3/uL — AB (ref 0.9–3.3)
LYMPH%: 21.3 % (ref 14.0–49.7)
MCH: 34.7 pg — AB (ref 25.1–34.0)
MCHC: 34.7 g/dL (ref 31.5–36.0)
MCV: 99.9 fL (ref 79.5–101.0)
MONO#: 0.1 10*3/uL (ref 0.1–0.9)
MONO%: 3.3 % (ref 0.0–14.0)
NEUT#: 2.2 10*3/uL (ref 1.5–6.5)
NEUT%: 71.7 % (ref 38.4–76.8)
Platelets: 407 10*3/uL — ABNORMAL HIGH (ref 145–400)
RBC: 3.58 10*6/uL — ABNORMAL LOW (ref 3.70–5.45)
RDW: 14.4 % (ref 11.2–14.5)
WBC: 3.1 10*3/uL — ABNORMAL LOW (ref 3.9–10.3)

## 2017-03-24 LAB — CEA (IN HOUSE-CHCC): CEA (CHCC-In House): 128.74 ng/mL — ABNORMAL HIGH (ref 0.00–5.00)

## 2017-03-24 MED ORDER — FULVESTRANT 250 MG/5ML IM SOLN
500.0000 mg | Freq: Once | INTRAMUSCULAR | Status: AC
  Administered 2017-03-24: 500 mg via INTRAMUSCULAR
  Filled 2017-03-24: qty 10

## 2017-03-24 NOTE — Patient Instructions (Signed)

## 2017-03-27 LAB — CANCER ANTIGEN 27.29: CA 27.29: 221.2 U/mL — ABNORMAL HIGH (ref 0.0–38.6)

## 2017-03-30 ENCOUNTER — Other Ambulatory Visit: Payer: Self-pay | Admitting: General Surgery

## 2017-03-30 ENCOUNTER — Telehealth: Payer: Self-pay

## 2017-03-30 DIAGNOSIS — K624 Stenosis of anus and rectum: Secondary | ICD-10-CM

## 2017-03-30 NOTE — Telephone Encounter (Signed)
Pt met with Dr Dalbert Batman surgeon yesterday. They are not capable to do all she thought was going to need. They mentioned putting in colostomy but would leave the tumor alone.   Going to [facility] in [place] for their protocol.  appt is Thursday 25-Monday 29th. They will be requesting records.   Pt will be coming by [facility] today about 4 to sign ROI form. Instructed her to ask at reception  And she mentioned going breast side and knock on door.

## 2017-03-31 ENCOUNTER — Ambulatory Visit
Admission: RE | Admit: 2017-03-31 | Discharge: 2017-03-31 | Disposition: A | Discharge status: Home / Self Care | Payer: 59 | Source: Ambulatory Visit | Attending: General Surgery | Admitting: General Surgery

## 2017-03-31 DIAGNOSIS — K624 Stenosis of anus and rectum: Secondary | ICD-10-CM

## 2017-03-31 MED ORDER — IOPAMIDOL (ISOVUE-300) INJECTION 61%
100.0000 mL | Freq: Once | INTRAVENOUS | Status: AC | PRN
  Administered 2017-03-31: 100 mL via INTRAVENOUS

## 2017-04-03 ENCOUNTER — Telehealth: Payer: Self-pay

## 2017-04-03 NOTE — Telephone Encounter (Signed)
Friday 10/19 CT scan abd and pelvis. Available now, were ordered by Dr Dalbert Batman. She is asking Dr [name] to review and give pt a call back.

## 2017-04-04 ENCOUNTER — Telehealth: Payer: Self-pay | Admitting: Oncology

## 2017-04-04 NOTE — Telephone Encounter (Signed)
FAXED RECORDS TO [facility] RELEASE ID [id number]

## 2017-04-05 ENCOUNTER — Telehealth: Payer: Self-pay | Admitting: Oncology

## 2017-04-05 NOTE — Telephone Encounter (Signed)
Faxed records to [facility] [phone]

## 2017-04-06 ENCOUNTER — Other Ambulatory Visit: Payer: Self-pay | Admitting: *Deleted

## 2017-04-06 ENCOUNTER — Telehealth: Payer: Self-pay | Admitting: *Deleted

## 2017-04-06 NOTE — Telephone Encounter (Signed)
This RN spoke with pt per her call stating she is currently at the [facility] in [place].  She is in the process of being restaged " they are wanting to do a CT of the chest tomorrow "  " they cannot get authorization because there is a prior CT of the chest ordered by Dr Jannifer Rodney that needs to be removed "  ( above is per insurance authorization ).  Sonia Hill is calling to see if the above can be done ASAP - and once done let her know so she can alert the consulting MD.  This RN left a VM with [name] per above.

## 2017-04-18 ENCOUNTER — Telehealth: Payer: Self-pay | Admitting: *Deleted

## 2017-04-18 NOTE — Telephone Encounter (Signed)
This RN received VM from pt stating need to cancel appointments on 04/20/2017 due to currently she " is at the [facility] for surgery for a colostomy."  Pt's identified VM received - message left informing her appointments have been canceled- currently she is scheduled for 05/19/2017 for lab and injection- but requested for pt to call this RN when she is discharged from above facility so appointment can be made for follow up with MD regarding appropriate treatment plan.  Will await return call.

## 2017-04-20 ENCOUNTER — Ambulatory Visit: Payer: 59 | Admitting: Adult Health

## 2017-04-20 ENCOUNTER — Other Ambulatory Visit: Payer: 59

## 2017-04-20 ENCOUNTER — Ambulatory Visit: Payer: 59

## 2017-05-08 ENCOUNTER — Telehealth: Payer: Self-pay | Admitting: *Deleted

## 2017-05-08 NOTE — Telephone Encounter (Signed)
This RN left VM  with pt last week to follow up per return from [facility] and her surgery. Sonia Hill returned call and left message on this RN's VM-  Sonia Hill states the surgery went well - she is learning how to manage her colostomy.  She is presently having a home nurse come in weekly for additional support.  She is " laying low at present and just getting used to my new me but I do want to come in for labs and then see Dr [name] to discuss treatment plans "  " I would like not to start any therapy until after the holidays and would like to see him to discuss "  This RN is sending an in basket request to contact pt to reschedule labs for this week as well as follow up appointment with Dr Sonia Hill-  Appointments for lab with injection will be cancelled.  This note will be forwarded to MD for review per communication.

## 2017-05-11 ENCOUNTER — Telehealth: Payer: Self-pay | Admitting: Oncology

## 2017-05-11 NOTE — Telephone Encounter (Signed)
Spoke to patient regarding upcoming November and December appointments.

## 2017-05-12 ENCOUNTER — Other Ambulatory Visit: Payer: 59

## 2017-05-15 ENCOUNTER — Other Ambulatory Visit: Payer: 59

## 2017-05-16 ENCOUNTER — Other Ambulatory Visit (HOSPITAL_BASED_OUTPATIENT_CLINIC_OR_DEPARTMENT_OTHER): Payer: 59

## 2017-05-16 DIAGNOSIS — M810 Age-related osteoporosis without current pathological fracture: Secondary | ICD-10-CM

## 2017-05-16 DIAGNOSIS — C7951 Secondary malignant neoplasm of bone: Secondary | ICD-10-CM | POA: Diagnosis not present

## 2017-05-16 DIAGNOSIS — C50312 Malignant neoplasm of lower-inner quadrant of left female breast: Secondary | ICD-10-CM | POA: Diagnosis not present

## 2017-05-16 DIAGNOSIS — C50912 Malignant neoplasm of unspecified site of left female breast: Secondary | ICD-10-CM

## 2017-05-16 LAB — CBC WITH DIFFERENTIAL/PLATELET
BASO%: 0.8 % (ref 0.0–2.0)
Basophils Absolute: 0 10*3/uL (ref 0.0–0.1)
EOS%: 6.2 % (ref 0.0–7.0)
Eosinophils Absolute: 0.3 10*3/uL (ref 0.0–0.5)
HCT: 36.9 % (ref 34.8–46.6)
HGB: 12.4 g/dL (ref 11.6–15.9)
LYMPH%: 21 % (ref 14.0–49.7)
MCH: 32.6 pg (ref 25.1–34.0)
MCHC: 33.7 g/dL (ref 31.5–36.0)
MCV: 96.7 fL (ref 79.5–101.0)
MONO#: 0.4 10*3/uL (ref 0.1–0.9)
MONO%: 10 % (ref 0.0–14.0)
NEUT%: 62 % (ref 38.4–76.8)
NEUTROS ABS: 2.7 10*3/uL (ref 1.5–6.5)
Platelets: 286 10*3/uL (ref 145–400)
RBC: 3.81 10*6/uL (ref 3.70–5.45)
RDW: 13 % (ref 11.2–14.5)
WBC: 4.3 10*3/uL (ref 3.9–10.3)
lymph#: 0.9 10*3/uL (ref 0.9–3.3)

## 2017-05-16 LAB — CEA (IN HOUSE-CHCC): CEA (CHCC-IN HOUSE): 107.43 ng/mL — AB (ref 0.00–5.00)

## 2017-05-17 ENCOUNTER — Other Ambulatory Visit: Payer: Self-pay | Admitting: *Deleted

## 2017-05-17 ENCOUNTER — Telehealth: Payer: Self-pay | Admitting: *Deleted

## 2017-05-17 DIAGNOSIS — M25472 Effusion, left ankle: Secondary | ICD-10-CM

## 2017-05-17 DIAGNOSIS — C50919 Malignant neoplasm of unspecified site of unspecified female breast: Secondary | ICD-10-CM

## 2017-05-17 DIAGNOSIS — C7951 Secondary malignant neoplasm of bone: Principal | ICD-10-CM

## 2017-05-17 DIAGNOSIS — M79662 Pain in left lower leg: Secondary | ICD-10-CM

## 2017-05-17 LAB — CANCER ANTIGEN 27.29: CAN 27.29: 218.7 U/mL — AB (ref 0.0–38.6)

## 2017-05-17 NOTE — Telephone Encounter (Signed)
This RN returned VM from pt per call regarding new lower leg swelling.  Sonia Hill states she noticed swelling pm last night of " just my ankle " - she slept with her leg slightly elevated. " then this AM it was kinda more up in my calf and more painful "  She denies any SOB.  Sonia Hill states she was d/ced from the [facility] in [place] with 30 syringes of lovenox 40 mg " but after 12 doses I stopped - thought I didn't need them anymore "  Per above- doppler can be done at 9 am in the morning at [facility].  Per MD review- recommended for pt to do 40 mg bid at present until doppler obtained.  Above discussed with pt- who verbalized understanding.

## 2017-05-18 ENCOUNTER — Ambulatory Visit (HOSPITAL_COMMUNITY)
Admission: RE | Admit: 2017-05-18 | Discharge: 2017-05-18 | Disposition: A | Discharge status: Home / Self Care | Payer: 59 | Source: Ambulatory Visit | Attending: Oncology | Admitting: Oncology

## 2017-05-18 DIAGNOSIS — M25472 Effusion, left ankle: Secondary | ICD-10-CM | POA: Insufficient documentation

## 2017-05-18 DIAGNOSIS — M79662 Pain in left lower leg: Secondary | ICD-10-CM | POA: Diagnosis not present

## 2017-05-18 DIAGNOSIS — C50919 Malignant neoplasm of unspecified site of unspecified female breast: Secondary | ICD-10-CM | POA: Diagnosis not present

## 2017-05-18 DIAGNOSIS — C7951 Secondary malignant neoplasm of bone: Secondary | ICD-10-CM | POA: Diagnosis not present

## 2017-05-18 NOTE — Progress Notes (Signed)
LLE venous duplex prelim: negative for DVT. Landry Mellow, RDMS, RVT Called results to [facility]

## 2017-05-19 ENCOUNTER — Telehealth: Payer: Self-pay | Admitting: *Deleted

## 2017-05-19 ENCOUNTER — Other Ambulatory Visit: Payer: 59

## 2017-05-19 ENCOUNTER — Ambulatory Visit: Payer: 59

## 2017-05-19 NOTE — Telephone Encounter (Signed)
Late entry.  Pt came in to see this RN post doppler with no evidence for DVT in lower left extremity.  Per assessment pt showed this RN both legs- with noted very minimal swelling on top of shin on left foot with mild indentation.  Pt able to adduct and abduct foot without discomfort.  Plan per above is Ogechi will continue lovenox at preventative dose of 40 mg daily.  She will continue to hydrate well and monitor salt intake.  She asked if she could increase her activity with this RN stating no contraindication -  Of note Berneice states she has resumed " ozone therapy to my rectum ".  Pt is scheduled for visit with MD next week.  No further needs at this time.  This note will be sent to MD for communication of interaction.

## 2017-05-22 ENCOUNTER — Emergency Department (HOSPITAL_BASED_OUTPATIENT_CLINIC_OR_DEPARTMENT_OTHER)
Admission: EM | Admit: 2017-05-22 | Discharge: 2017-05-22 | Disposition: A | Discharge status: Home / Self Care | Payer: 59 | Attending: Emergency Medicine | Admitting: Emergency Medicine

## 2017-05-22 ENCOUNTER — Emergency Department (HOSPITAL_BASED_OUTPATIENT_CLINIC_OR_DEPARTMENT_OTHER): Payer: 59

## 2017-05-22 ENCOUNTER — Encounter (HOSPITAL_BASED_OUTPATIENT_CLINIC_OR_DEPARTMENT_OTHER): Payer: Self-pay | Admitting: Emergency Medicine

## 2017-05-22 ENCOUNTER — Other Ambulatory Visit: Payer: Self-pay

## 2017-05-22 DIAGNOSIS — R197 Diarrhea, unspecified: Secondary | ICD-10-CM | POA: Diagnosis not present

## 2017-05-22 DIAGNOSIS — R110 Nausea: Secondary | ICD-10-CM | POA: Diagnosis not present

## 2017-05-22 DIAGNOSIS — E039 Hypothyroidism, unspecified: Secondary | ICD-10-CM | POA: Insufficient documentation

## 2017-05-22 DIAGNOSIS — Z933 Colostomy status: Secondary | ICD-10-CM | POA: Insufficient documentation

## 2017-05-22 DIAGNOSIS — Z853 Personal history of malignant neoplasm of breast: Secondary | ICD-10-CM | POA: Diagnosis not present

## 2017-05-22 DIAGNOSIS — R109 Unspecified abdominal pain: Secondary | ICD-10-CM | POA: Diagnosis not present

## 2017-05-22 DIAGNOSIS — C785 Secondary malignant neoplasm of large intestine and rectum: Secondary | ICD-10-CM | POA: Diagnosis not present

## 2017-05-22 LAB — COMPREHENSIVE METABOLIC PANEL
ALT: 21 U/L (ref 14–54)
ANION GAP: 8 (ref 5–15)
AST: 32 U/L (ref 15–41)
Albumin: 3.8 g/dL (ref 3.5–5.0)
Alkaline Phosphatase: 76 U/L (ref 38–126)
BUN: 12 mg/dL (ref 6–20)
CHLORIDE: 96 mmol/L — AB (ref 101–111)
CO2: 25 mmol/L (ref 22–32)
Calcium: 9.2 mg/dL (ref 8.9–10.3)
Creatinine, Ser: 0.72 mg/dL (ref 0.44–1.00)
Glucose, Bld: 101 mg/dL — ABNORMAL HIGH (ref 65–99)
POTASSIUM: 4.2 mmol/L (ref 3.5–5.1)
SODIUM: 129 mmol/L — AB (ref 135–145)
Total Bilirubin: 0.3 mg/dL (ref 0.3–1.2)
Total Protein: 6.3 g/dL — ABNORMAL LOW (ref 6.5–8.1)

## 2017-05-22 LAB — CBC WITH DIFFERENTIAL/PLATELET
Basophils Absolute: 0 10*3/uL (ref 0.0–0.1)
Basophils Relative: 0 %
EOS ABS: 0.2 10*3/uL (ref 0.0–0.7)
Eosinophils Relative: 3 %
HEMATOCRIT: 33.5 % — AB (ref 36.0–46.0)
HEMOGLOBIN: 11.5 g/dL — AB (ref 12.0–15.0)
LYMPHS ABS: 1.5 10*3/uL (ref 0.7–4.0)
LYMPHS PCT: 22 %
MCH: 32.1 pg (ref 26.0–34.0)
MCHC: 34.3 g/dL (ref 30.0–36.0)
MCV: 93.6 fL (ref 78.0–100.0)
MONOS PCT: 9 %
Monocytes Absolute: 0.6 10*3/uL (ref 0.1–1.0)
NEUTROS ABS: 4.6 10*3/uL (ref 1.7–7.7)
NEUTROS PCT: 66 %
PLATELETS: 304 10*3/uL (ref 150–400)
RBC: 3.58 MIL/uL — AB (ref 3.87–5.11)
RDW: 11.6 % (ref 11.5–15.5)
WBC: 6.9 10*3/uL (ref 4.0–10.5)

## 2017-05-22 LAB — URINALYSIS, ROUTINE W REFLEX MICROSCOPIC
BILIRUBIN URINE: NEGATIVE
Glucose, UA: NEGATIVE mg/dL
HGB URINE DIPSTICK: NEGATIVE
Ketones, ur: NEGATIVE mg/dL
Leukocytes, UA: NEGATIVE
Nitrite: NEGATIVE
Protein, ur: NEGATIVE mg/dL
SPECIFIC GRAVITY, URINE: 1.01 (ref 1.005–1.030)
pH: 7.5 (ref 5.0–8.0)

## 2017-05-22 LAB — LIPASE, BLOOD: Lipase: 37 U/L (ref 11–51)

## 2017-05-22 MED ORDER — IOPAMIDOL (ISOVUE-300) INJECTION 61%
100.0000 mL | Freq: Once | INTRAVENOUS | Status: AC | PRN
  Administered 2017-05-22: 100 mL via INTRAVENOUS

## 2017-05-22 MED ORDER — ONDANSETRON 4 MG PO TBDP: 4.0000 mg | ORAL_TABLET | Freq: Three times a day (TID) | ORAL | 0 refills | Status: DC | PRN

## 2017-05-22 NOTE — ED Provider Notes (Signed)
[facility] EMERGENCY DEPARTMENT Provider Note   CSN: [id number] Arrival date & time: 05/22/17  1628     History   Chief Complaint Chief Complaint  Patient presents with  . Abdominal Pain    HPI Sonia Hill is a 62 y.o. female.  The history is provided by the patient and medical records. No language interpreter was used.     Sonia Hill is a 61 y.o. 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 Pain woke her from her sleep about 2 am and was associated with nausea and multiple episodes of emesis. Saturday night, she was feeling better. She was able to keep some soup down and felt improved. She felt better on Sunday as well, however awoke around 3 am this morning with nausea and right sided abdominal pain. She did not have any emesis today, however did have non-bloody loose stool from her colostomy bag. (Of note, colostomy placed on November 5th, 2018 due to breast cancer mets to rectum). No fever or chills. No medications taken prior to arrival for symptoms. No sick contacts.    Past Medical History:  Diagnosis Date  . Arthritis    neck, upper back, shoulders - no meds - yoga  . Chronic kidney disease    Only has right kidney  . Depression    Hx - no current problem  . GERD (gastroesophageal reflux disease)    diet controlled - no meds  . Hyperlipidemia    diet controlled  . Hypothyroidism   . lt breast ca dx'd 11/2009  . Osteoporosis, unspecified 12/06/2013  . PONV (postoperative nausea and vomiting)   . SVD (spontaneous vaginal delivery)    x 1    Patient Active Problem List   Diagnosis Date Noted  . Breast cancer metastasized to multiple sites ([place]) 10/16/2015  . Genetic testing 06/24/2015  . Osteoporosis 12/06/2013  . Cancer of lower-inner  quadrant of left female breast ([place]) 03/22/2013  . Breast cancer metastasized to bone [facility]) 10/03/2012    Past Surgical History:  Procedure Laterality Date  . BLADDER NECK RECONSTRUCTION     made a new urethea tube  . bladder tact  2008  . BREAST SURGERY     masectomy left breast  . coloscopy    . COLOSTOMY    . DILATION AND CURETTAGE OF UTERUS    . LAPAROSCOPY N/A 01/24/2013   Procedure: LAPROSCOPY OPERATIVE;  Surgeon: Margarette Asal, MD;  Location: [facility];  Service: Gynecology;  Laterality: N/A;  . left kidney removed     left kidney dysfunction  . SALPINGOOPHORECTOMY Bilateral 01/24/2013   Procedure: SALPINGO OOPHORECTOMY;  Surgeon: Margarette Asal, MD;  Location: [facility];  Service: Gynecology;  Laterality: Bilateral;  . WISDOM TOOTH EXTRACTION      OB History    No data available       Home Medications    Prior to Admission medications   Medication Sig Start Date End Date Taking? Authorizing Provider  ascorbic acid (VITAMIN C) 1000 MG tablet Take 1,000 mg by mouth 3 (three) times daily.     [provider]  Ashwagandha 125 MG CAPS Take 125 mg by mouth daily.  03/23/15   Magrinat, Virgie Dad, MD  Cholecalciferol (VITAMIN D-3) 5000 UNITS TABS Take 5,000 Units  by mouth daily.     [provider]  docusate sodium (COLACE) 250 MG capsule Take 1 capsule (250 mg total) by mouth daily. 03/25/16   Magrinat, Virgie Dad, MD  FOLIC ACID PO Take 1,017 mcg by mouth daily.     [provider]  MAGNESIUM PO Take 325 mg by mouth at bedtime as needed (leg cramps). Magnesium calm - powder    [provider]  Multiple Vitamin (MULTIVITAMIN WITH MINERALS) TABS tablet Take 1 tablet by mouth daily.    [provider]  Multiple Vitamins-Minerals (HAIR/SKIN/NAILS/BIOTIN) TABS Take 1 tablet by mouth daily.    [provider]  niacin 250 MG tablet Take 250 mg by mouth at bedtime.    [provider]  Omega-3 Fatty Acids (FISH OIL) 1200 MG  CAPS Take 1,200 mg by mouth 2 (two) times daily.    [provider]  ondansetron (ZOFRAN ODT) 4 MG disintegrating tablet Take 1 tablet (4 mg total) by mouth every 8 (eight) hours as needed for nausea or vomiting. 05/22/17   Ward, Ozella Almond, PA-C  OVER THE COUNTER MEDICATION Take 12.5 mg by mouth daily. Iodoral Iodine  supplement    [provider]  palbociclib (IBRANCE) 125 MG capsule Take 1 capsule (125 mg total) by mouth daily with breakfast. Take whole with food.  Take 1 capsule daily for 21 days  ON,  7 days  OFF. 03/06/17   Magrinat, Virgie Dad, MD  Probiotic Product (PROBIOTIC DAILY PO) Take 1 tablet by mouth daily.     [provider]  thyroid (NATURE-THROID) 65 MG tablet Take 81.25 mg by mouth daily.    [provider]    Family History Family History  Problem Relation Age of Onset  . Breast cancer Mother 59  . Breast cancer Maternal Aunt 58  . Breast cancer Other        MGF's sister dx with breast cancer in her 72s    Social History Social History   Tobacco Use  . Smoking status: Never Smoker  . Smokeless tobacco: Never Used  Substance Use Topics  . Alcohol use: No  . Drug use: No     Allergies   Adhesive [tape]; Betadine [povidone iodine]; Mercury; and Sulfa antibiotics   Review of Systems Review of Systems  Constitutional: Negative for chills and fever.  Gastrointestinal: Positive for abdominal pain, diarrhea, nausea and vomiting. Negative for blood in stool.  All other systems reviewed and are negative.    Physical Exam Updated Vital Signs BP 126/73 (BP Location: Right Wrist)   Pulse 86   Temp 98.3 F (36.8 C) (Oral)   Resp 20   Ht 5\' 1"  (1.549 m)   Wt 46.7 kg (103 lb)   SpO2 100%   BMI 19.46 kg/m   Physical Exam  Constitutional: She is oriented to person, place, and time. She appears well-developed and well-nourished. No distress.  Non-toxic appearing.  HENT:  Head: Normocephalic and atraumatic.    Cardiovascular: Normal rate, regular rhythm and normal heart sounds.  No murmur heard. Pulmonary/Chest: Effort normal and breath sounds normal. No respiratory distress.  Abdominal: Soft. She exhibits no distension.  Tenderness to palpation of mid right abdomen and RLQ. No rebound tenderness. No guarding. Colostomy in place to LLQ.  Musculoskeletal: She exhibits no edema.  Neurological: She is alert and oriented to person, place, and time.  Skin: Skin is warm and dry.  Nursing note and vitals reviewed.    ED Treatments /  Results  Labs (all labs ordered are listed, but only abnormal results are displayed) Labs Reviewed  COMPREHENSIVE METABOLIC PANEL - Abnormal; Notable for the following components:      Result Value   Sodium 129 (*)    Chloride 96 (*)    Glucose, Bld 101 (*)    Total Protein 6.3 (*)    All other components within normal limits  CBC WITH DIFFERENTIAL/PLATELET - Abnormal; Notable for the following components:   RBC 3.58 (*)    Hemoglobin 11.5 (*)    HCT 33.5 (*)    All other components within normal limits  C DIFFICILE QUICK SCREEN W PCR REFLEX  LIPASE, BLOOD  URINALYSIS, ROUTINE W REFLEX MICROSCOPIC    EKG  EKG Interpretation None       Radiology Ct Abdomen Pelvis W Contrast  Result Date: 05/22/2017 CLINICAL DATA:  Patient with right-sided abdominal pain for 2 days. Patient with history of metastatic breast cancer. EXAM: CT ABDOMEN AND PELVIS WITH CONTRAST TECHNIQUE: Multidetector CT imaging of the abdomen and pelvis was performed using the standard protocol following bolus administration of intravenous contrast. CONTRAST:  1105mL ISOVUE-300 IOPAMIDOL (ISOVUE-300) INJECTION 61% COMPARISON:  CT abdomen pelvis 03/31/2017 FINDINGS: Lower chest: Normal heart size. Dependent atelectasis within the bilateral lower lobes. No pleural effusion. Hepatobiliary: The liver is normal in size and contour. Unchanged 4 mm low-attenuation lesion hepatic dome (image 8;  series 2), too small to characterize. Gallbladder is unremarkable. No intrahepatic or extrahepatic biliary ductal dilatation. Pancreas: Unremarkable Spleen: Unremarkable Adrenals/Urinary Tract: The adrenal glands are normal. The left kidney is absent. Persistent mild right pelviectasis. Mild asymmetric wall thickening of the left aspect of the urinary bladder. Stomach/Bowel: Re- demonstrated marked circumferential wall thickening of the rectum. Interval increase in size of perirectal soft tissue masses including a 2.1 x 3.4 cm mass anterior to the rectum (image 65; series 2), previously 1.2 x 2.5 cm. Right perirectal soft tissue mass measures 2.6 x 2.1 cm (image 66; series 2), previously 2.1 x 1.4 cm. Findings are concerning for perirectal soft tissue tumors. Interval diverting left lower quadrant colostomy. There is wall thickening of the descending colon. Normal morphology of the stomach. No evidence for small bowel obstruction. No free intraperitoneal air. Vascular/Lymphatic: Normal caliber abdominal aorta. No retroperitoneal lymphadenopathy. Reproductive: Uterus is atrophic. Status post bilateral oophorectomy. Other: Interval increase in moderate volume pelvic free fluid. Left upper quadrant fat stranding. Small amount of fluid within the right upper quadrant. Musculoskeletal: Lumbar spine degenerative changes. No aggressive or acute appearing osseous lesions. IMPRESSION: 1. Interval increase in size of perirectal soft tissue masses. 2. Mild wall thickening of the descending colon which may be postprocedural in etiology. Colitis is not excluded. 3. There is fat stranding within the left upper quadrant of the abdomen which is likely postprocedural in etiology. Recommend attention on follow-up exams to exclude the possibility of metastatic disease. 4. Re- demonstrated extensive rectal wall thickening and hyperenhancement. 5. Interval increase in size of moderate fluid within the pelvis. New small amount of fluid  within the right upper quadrant. 6. Interval diverting colostomy within the left hemiabdomen. Mild right pelviectasis. Electronically Signed   By: Lovey Newcomer M.D.   On: 05/22/2017 19:36    Procedures Procedures (including critical care time)  Medications Ordered in ED Medications  iopamidol (ISOVUE-300) 61 % injection 100 mL (100 mLs Intravenous Contrast Given 05/22/17 1857)     Initial Impression / Assessment and Plan / ED Course  I have reviewed the triage  vital signs and the nursing notes.  Pertinent labs & imaging results that were available during my care of the patient were reviewed by me and considered in my medical decision making (see chart for details).    Sonia Hill is a 61 y.o. female with metastatic breast cancer with colostomy placed in November who presents to ED for intermittent abdominal pain x 3 days. Associated with nausea that has now resolved along with loose stools. On exam, patient is afebrile, hemodynamically stable and non-toxic appearing. No focal tenderness, but she is tender to the right side of her abdomen. Labs reviewed and reassuring.   CT abdomen/pelvis obtained and reviewed in depth with attending. Patient does have mild wall thickening of the descending colon: postprocedural vs. Colitis. C. Diff obtained. Patient re-evaluated with reassuring abdominal exam. No abdominal pain or nausea at this time. Discussed recommended plan of symptomatic management and follow up on pending c. Diff with close follow up. Patient agrees with this plan. Zofran PRN nausea. Reasons to return to ER discussed with patient and husband at length. All questions answered.  Patient discussed with Dr. Maryan Rued who agrees with treatment plan.    Final Clinical Impressions(s) / ED Diagnoses   Final diagnoses:  Abdominal pain, unspecified abdominal location  Diarrhea, unspecified type    ED Discharge Orders        Ordered    ondansetron (ZOFRAN ODT) 4 MG disintegrating tablet   Every 8 hours PRN     05/22/17 2005       Ward, Ozella Almond, PA-C 05/22/17 2028    Blanchie Dessert, MD 05/23/17 [phone]

## 2017-05-22 NOTE — ED Triage Notes (Addendum)
Patient reports right sided abdominal pain which began Saturday morning.  Reports N/V/D.  Denies dysuria.  Reports colostomy performed on 11/5.

## 2017-05-22 NOTE — Discharge Instructions (Signed)
It was my pleasure taking care of you today!   Keep your scheduled appointment with your doctor this week.   Zofran as needed for nausea.   Return to ER for new or worsening symptoms, any additional concerns.

## 2017-05-23 ENCOUNTER — Telehealth: Payer: Self-pay | Admitting: *Deleted

## 2017-05-23 LAB — C DIFFICILE QUICK SCREEN W PCR REFLEX
C DIFFICLE (CDIFF) ANTIGEN: NEGATIVE
C Diff interpretation: NOT DETECTED
C Diff toxin: NEGATIVE

## 2017-05-23 NOTE — Progress Notes (Signed)
ID: Sonia Hill   DOB: [birth year]  MR#: [id number]  WUJ#:[id number]  Patient Care Team: [name], MD as PCP - General (Family Medicine) Sameer Teeple, Virgie Dad, MD as Consulting Physician (Oncology) Molli Posey, MD as Consulting Physician (Obstetrics and Gynecology) Laurence Spates, MD as Consulting Physician (Gastroenterology) Leighton Ruff, MD as Consulting Physician (General Surgery) Delrae Rend, MD as Consulting Physician (Endocrinology) Laureen Abrahams, RN as Registered Nurse (Oncology) Clarene Essex, MD as Consulting Physician (Gastroenterology)   CHIEF COMPLAINT: Metastatic breast cancer, estrogen receptor positive  CURRENT TREATMENT: fulvestrant, palbociclib. 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  Since her last visit here she went to [facility] [place] and she underwent a colostomy placement since she was having significant problems with disease obstruction. Today she is accompanied by her husband.   Since her last visit, she completed a CT of the abdomen on 05/22/2017 showing: Interval increase in size of perirectal soft tissue masses. Mild wall thickening of the descending colon which may be postprocedural in etiology. Colitis is not excluded. There is fat stranding within the left upper quadrant of the abdomen which is likely postprocedural in etiology. Recommend attention on follow-up exams to exclude the possibility of metastatic disease. Re- demonstrated extensive rectal wall thickening and hyperenhancement. Interval increase in size of moderate fluid within the pelvis. New small amount of fluid within the right upper quadrant. Interval diverting colostomy within the left hemiabdomen. Mild right pelviectasis.    REVIEW OF SYSTEMS: Sonia Hill reports that she went to  [facility] in [place] and had a  pleasant experience. She reports that they cook food based on the "Clean 15" diet. She notes that these are foods that are organic based without any pesticides. She notes that they covered their transportation and the majority of their hotel expenses that wasn't covered by their insurance. She reports that her colostomy placement went well. She reports that Dr. Chrissie Noa did a loop colectomy through a laparoscopic procedure. She reports that since she's been back, she hasn't been eating well lately. She reports that on Saturday and Sunday night, she had a viral or possible food poising with vomiting and diarrhea. She noted that prior to the vomiting, she hit a wave of abdominal pain. She notes that the stoma reacted and was swollen and inflamed, and she tried to visit the urgent care because they thought that there could be a problem with her gallbladder. She reports that she is improving with care of her stoma. She notes that it doesn't affect her life entirely. She doesn't work and she mainly stays at home. However, she notes that her stoma condition is sensitive right now. She reports that she has changed her medications. She was placed on DIM (diindolymethane) a natural estrogen blocker treatment. She was also placed on zofran as needed. She is taking 1000 mg of curcumin and  800 mg of Advil for the inflammation. She notes that she takes it easy with her medications when her stomach is upset. She notes that she has not started taking fulvestrant yet. She denies unusual headaches, visual changes, nausea, vomiting, or dizziness. There has been no unusual cough, phlegm production, or pleurisy. This been no change in bowel or bladder habits. She denies unexplained fatigue or unexplained weight loss, bleeding, rash, or fever. A detailed review of systems was otherwise stable.      BREAST CANCER HISTORY:  From the initial intake note:  Sonia Hill had screening mammography on 11/10/2009 showing very dense tissue with  a possible distortion in the left breast.  Digital mammography on June 10th showed a 1.5 cm cluster of microcalcifications in the lower inner portion of the breast.  On physical exam, there was no palpable mass and no palpable axillary adenopathy.  There was some nipple inversion inferolaterally.  Ultrasound showed a 2.6 cm irregular mass-like area with dense posterior acoustic shadowing in the 3 o'clock position, 1 cm from the nipple, but normal appearing left axillary lymph nodes.    The patient was brought back for biopsy of the mass in question on June 14th and the pathology (Sonia Hill) showed an invasive lobular breast cancer (E-cadherin negative) apparently low grade.  A second mass noted on ultrasound was also lobular, also low grade.  Both were ER and PR positive at well over 90%.  Both had low proliferation markers at 9%, and both were Her-2 negative by CISH with ratios of 1.6 and 1.24.  In short, both masses were nearly identical.    On June 16th, the patient had bilateral breast MRIs which showed in the left breast at 3 o'clock an irregular area of abnormal enhancement measuring up to 2.7 cm and in the left lower inner quadrant, a second post-biopsy area measuring 1.5 cm.  The difference between these two areas was stated at the conference the morning of the visit to be approximately 5 cm.  Given this data the patient opted for Left mastectomy with sentinel lymph node sampling, performed July 2011 with results as detailed below.  Subsequent history is as detailed below.    PAST MEDICAL HISTORY: Past Medical History:  Diagnosis Date  . Arthritis    neck, upper back, shoulders - no meds - yoga  . Chronic kidney disease    Only has right kidney  . Depression    Hx - no current problem  . GERD (gastroesophageal reflux disease)    diet controlled - no meds  . Hyperlipidemia    diet controlled  . Hypothyroidism   . lt breast ca dx'd 11/2009  . Osteoporosis, unspecified 12/06/2013  .  PONV (postoperative nausea and vomiting)   . SVD (spontaneous vaginal delivery)    x 1  1. Osteopenia. 2. Congenital left renal atrophy.  3. History of recurrent UTIs. 4. History of bladder reconstruction for reflux more than 18 years ago. 5. History of depression and anxiety. 6. History of hypothyroidism.  7. History of "sling procedure".  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    FAMILY HISTORY The patient's father is alive in his early 77s.  He has Alzheimer's disease. The patient's mother was diagnosed with breast cancer at the age of 46.  She died at the age of 27.  The patient's father had one out of two sisters with breast cancer.  There are other breast cancers on the mother's side.  The patient has one brother and one younger sister, neither with cancer.    GYNECOLOGIC HISTORY:  (Reviewed 12/06/2013) She is GX P1.  That pregnancy was at age 27.  Menarche was at age 19.  She was perimenopausal at the time of diagnosis but is now convincingly postmenopausal  SOCIAL HISTORY:  (Reviewed 12/06/2013) She works as a Electrical engineer, part-time and on consultation.  Her husband, [name], is a Architect at [employer].  [name] works as a Pharmacist, hospital in  [place].  The patient's son [name] is  in college.  The patient attends [employer].   ADVANCED DIRECTIVES: in place  HEALTH MAINTENANCE: (Updated 12/06/2013)  Social History   Tobacco Use  . Smoking status: Never Smoker  . Smokeless tobacco: Never Used  Substance Use Topics  . Alcohol use: No  . Drug use: No     Colonoscopy: 06/18/2015; [facility]  PAP: UTD/May 2015, Dr. Matthew Saras  Bone density: [name] 2014, osteoporosis  Lipid panel: UTD (Not on file)    Allergies  Allergen Reactions  . Adhesive [Tape] Rash    Ok to use paper tape and tegaderm over IV site  . Betadine [Povidone Iodine] Rash  . Mercury Rash    Reaction mercurachrome   . Sulfa Antibiotics Rash    Current Outpatient Medications  Medication Sig Dispense Refill  . ascorbic acid (VITAMIN C) 1000 MG tablet Take 1,000 mg by mouth 3 (three) times daily.     . Ashwagandha 125 MG CAPS Take 125 mg by mouth daily.     . Cholecalciferol (VITAMIN D-3) 5000 UNITS TABS Take 5,000 Units by mouth daily.     Marland Kitchen FOLIC ACID PO Take 0,857 mcg by mouth daily.     Marland Kitchen MAGNESIUM PO Take 325 mg by mouth at bedtime as needed (leg cramps). Magnesium calm - powder    . Multiple Vitamins-Minerals (HAIR/SKIN/NAILS/BIOTIN) TABS Take 1 tablet by mouth daily.    . Omega-3 Fatty Acids (FISH OIL) 1200 MG CAPS Take 1,200 mg by mouth 2 (two) times daily.    . ondansetron (ZOFRAN ODT) 4 MG disintegrating tablet Take 1 tablet (4 mg total) by mouth every 8 (eight) hours as needed for nausea or vomiting. 10 tablet 0  . OVER THE COUNTER MEDICATION Take 12.5 mg by mouth daily. Iodoral Iodine  supplement    . Probiotic Product (PROBIOTIC DAILY PO) Take 1 tablet by mouth daily.     . Thyroid (NATURE-THROID) 81.25 MG TABS Take 81.25 mg by mouth daily.    Marland Kitchen UNABLE TO FIND Curcumin Liposomal  1000 mg  daily    . UNABLE TO FIND DIM Indol 3-Carbinol  60 mg  2 caps twice daily    . palbociclib (IBRANCE) 125 MG capsule Take 1 capsule (125 mg total) by mouth daily with breakfast. Take whole with food.  Take 1 capsule daily for 21 days  ON,  7 days  OFF. (Patient not taking: Reported on 05/25/2017) 21 capsule 6   No current facility-administered medications for this visit.     OBJECTIVE: Middle aged white woman who appears stated age  9:   05/25/17 1410  BP: 130/75  Pulse: 81  Resp: 18  Temp: 98.5 F (36.9 C)  SpO2: 96%     Body mass index is 19.84 kg/m.    ECOG FS: 1 Filed Weights   05/25/17 1410  Weight: 105 lb (47.6 kg)   Sclerae unicteric, pupils round and equal Oropharynx clear and moist No cervical or supraclavicular adenopathy Lungs no rales or rhonchi Heart regular rate and  rhythm Abd soft, nontender, positive bowel sounds, colostomy in place, bag intact, laparoscopy incisions healing nicely MSK no focal spinal tenderness, no upper extremity lymphedema Neuro: nonfocal, well oriented, appropriate affect Breasts: The right breast is unremarkable.  The left breast is status post mastectomy.  There is no evidence of chest wall recurrence.  Both axillae are benign    LAB RESULTS:   Lab Results  Component Value Date  WBC 6.9 05/22/2017   NEUTROABS 4.6 05/22/2017   HGB 11.5 (L) 05/22/2017   HCT 33.5 (L) 05/22/2017   MCV 93.6 05/22/2017   PLT 304 05/22/2017      Chemistry      Component Value Date/Time   NA 129 (L) 05/22/2017 1705   NA 134 (L) 12/28/2016 1503   K 4.2 05/22/2017 1705   K 4.0 12/28/2016 1503   CL 96 (L) 05/22/2017 1705   CL 98 12/03/2012 1419   CO2 25 05/22/2017 1705   CO2 25 12/28/2016 1503   BUN 12 05/22/2017 1705   BUN 12.0 12/28/2016 1503   CREATININE 0.72 05/22/2017 1705   CREATININE 0.8 12/28/2016 1503      Component Value Date/Time   CALCIUM 9.2 05/22/2017 1705   CALCIUM 9.7 12/28/2016 1503   ALKPHOS 76 05/22/2017 1705   ALKPHOS 76 12/28/2016 1503   AST 32 05/22/2017 1705   AST 22 12/28/2016 1503   ALT 21 05/22/2017 1705   ALT 20 12/28/2016 1503   BILITOT 0.3 05/22/2017 1705   BILITOT 0.50 12/28/2016 1503      Lab Results  Component Value Date   LABCA2 181 (H) 10/23/2015   STUDIES: Ct Abdomen Pelvis W Contrast  Result Date: 05/22/2017 CLINICAL DATA:  Patient with right-sided abdominal pain for 2 days. Patient with history of metastatic breast cancer. EXAM: CT ABDOMEN AND PELVIS WITH CONTRAST TECHNIQUE: Multidetector CT imaging of the abdomen and pelvis was performed using the standard protocol following bolus administration of intravenous contrast. CONTRAST:  163m ISOVUE-300 IOPAMIDOL (ISOVUE-300) INJECTION 61% COMPARISON:  CT abdomen pelvis 03/31/2017 FINDINGS: Lower chest: Normal heart size. Dependent  atelectasis within the bilateral lower lobes. No pleural effusion. Hepatobiliary: The liver is normal in size and contour. Unchanged 4 mm low-attenuation lesion hepatic dome (image 8; series 2), too small to characterize. Gallbladder is unremarkable. No intrahepatic or extrahepatic biliary ductal dilatation. Pancreas: Unremarkable Spleen: Unremarkable Adrenals/Urinary Tract: The adrenal glands are normal. The left kidney is absent. Persistent mild right pelviectasis. Mild asymmetric wall thickening of the left aspect of the urinary bladder. Stomach/Bowel: Re- demonstrated marked circumferential wall thickening of the rectum. Interval increase in size of perirectal soft tissue masses including a 2.1 x 3.4 cm mass anterior to the rectum (image 65; series 2), previously 1.2 x 2.5 cm. Right perirectal soft tissue mass measures 2.6 x 2.1 cm (image 66; series 2), previously 2.1 x 1.4 cm. Findings are concerning for perirectal soft tissue tumors. Interval diverting left lower quadrant colostomy. There is wall thickening of the descending colon. Normal morphology of the stomach. No evidence for small bowel obstruction. No free intraperitoneal air. Vascular/Lymphatic: Normal caliber abdominal aorta. No retroperitoneal lymphadenopathy. Reproductive: Uterus is atrophic. Status post bilateral oophorectomy. Other: Interval increase in moderate volume pelvic free fluid. Left upper quadrant fat stranding. Small amount of fluid within the right upper quadrant. Musculoskeletal: Lumbar spine degenerative changes. No aggressive or acute appearing osseous lesions. IMPRESSION: 1. Interval increase in size of perirectal soft tissue masses. 2. Mild wall thickening of the descending colon which may be postprocedural in etiology. Colitis is not excluded. 3. There is fat stranding within the left upper quadrant of the abdomen which is likely postprocedural in etiology. Recommend attention on follow-up exams to exclude the possibility of  metastatic disease. 4. Re- demonstrated extensive rectal wall thickening and hyperenhancement. 5. Interval increase in size of moderate fluid within the pelvis. New small amount of fluid within the right upper quadrant. 6. Interval diverting colostomy  within the left hemiabdomen. Mild right pelviectasis. Electronically Signed   By: Lovey Newcomer M.D.   On: 05/22/2017 19:36     ASSESSMENT: 61 y.o.  BRCA 1-2 negative [facility] woman with lobular breast cancer stage IV at presentation July 2011  (1) status post left mastectomy and sentinel lymph node sampling in July 2011 for a lower inner quadrant T1 N1 M1, stage IV invasive lobular breast cancer, grade 1, strongly estrogen and progesterone receptor-positive, HER2 negative with MIB-1 of 9% and no HER2 amplification,   (2) with multiple sclerotic bone lesions at presentation seen only on CT scan (not on bone scan or PET scan), but  with biopsy-proven metastatic disease to bone; and an elevated CA 27.29 at presentation,   (3) Oncotype recurrence score of 4, predicts a good response to antiestrogens.  (4) Systemic treatment has consisted of  a) tamoxifen with evidence of response but poor tolerance  b) letrozole starting August 2012, discontinued October 2014 per patient   (5) single functioning kidney  (6) status post bilateral salpingo-oophorectomy 01/24/2013, with benign pathology  (7) osteoporosis; the patient refuses bisphosphonate therapy; started [facility] February 2014.    (a) bone density was obtained under the care of Dr. Matthew Saras at [facility], 06/25/2012, showing osteoporosis with a T score -2.6. This was repeated 12/20/2013, showing again osteoporosis with T-scores between -2.4 and -2.8.  (b) the patient refuses zolendronate, denosumab, or other pharmacologic intervention  (8) the patient refuses standard Mammography or tomography; undergoing thermography screening of the right breast.  (9) PET scan 06/05/2015  shows rectal thickening and a presacral mass; biopsy of this area 10/09/2015 confirms metastatic lobular breast cancer, again estrogen receptor positive, HER-2 nonamplified.  (a) pelvic MRI 03/11/2016 confirms stability of disease  (b) chest CT and pelvic MRI 09/02/2016 shows no change in the circumferential rectal thickening or evidence of extension beyond the serosa and chest CT findings  (c) colonoscopy 02/23/2017 showed a very narrow rectal lumen with a few apparently uninvolved centimeters distally   (10) started fulvestrant and palbociclib 125 mg/ day [21/7] May 2017  (a) palbociclib dose decreased to 100 mg daily [21/7] with second cycle, started 11/25/2015  (b) palbociclib discontinued 03/20/2017 with evidence of disease progression  (c) last to fulvestrant dose 03/24/2017, discontinued with evidence of progression  (11) status post colostomy placement at [facility] [place]   PLAN:  I spent a little over an hour today with Sonia Hill and her husband going over her complex situation.  We reviewed her experience at [facility] [place], her postop care, and her colostomy care.  We also reviewed the suggestion from the medical oncologist there, which was that she gave capecitabine and a chance.  Today we went back to basics. She understands that stage IV breast cancer is not curable with our current knowledge base. The goal of treatment is control. The strategy of treatment is to do only the minimum necessary to control the growth of the tumor so that the patient can have as normal a life as possible. There is no survival advantage in treating aggressively if treating less aggressively results in tumor control. With this strategy stage IV breast cancer in many cases can function as a "chronic illness": something that cannot be quite gotten rid of but can be controlled for an indefinite period of time  There are always 3 questions to go over and so we reviewed those today. The first  question is do we treat or not.  In some cases the patient's overall situation is so discouraging that palliative/comfort care alone is appropriate. If the decision is made to treat, then the next question is whether anti-estrogens or chemotherapy is more appropriate. Once that decision is made than the third question is: Which agent or combination of agents in particular should be used?  She has a very good functional status and definitely should be treated.  I gave her a choice between chemotherapy, which could be keep Cytovene, although my suggestion was CMF; or more antiestrogens, and in that case we would go with exemestane and everolimus.  We specifically discussed the possible side effects toxicities and complications of CMF versus dose of exemestane/everolimus in great detail.  After all this she tells me that she is going to pray about it.  She does not want chemotherapy, antiestrogens, or radiation.  She is trying a variety of herbal and other remedies.  She is taking indole 3 carbamoyl, and she is hoping this plus other supplements will control the cancer.  She does have significant pain, level of 3-4; she is going to try to control this with Tylenol and Motrin.  She will let me know if that does not work.  She understands that things can really get out of hand if her tumor starts causing her pain at a 6-8 level in which case radiation may be the only effective treatment option for her..  Unless she calls me before that, she will see me again the second week in January we will again go over these options at that time.   Sonia Hill, Virgie Dad, MD  05/25/17 2:54 PM Medical Oncology and Hematology [facility] [address] [place], [place] [address] Tel. [phone]    Fax. [phone]  This document serves as a record of services personally performed by Lurline Del, MD. It was created on his behalf by Sheron Nightingale, a trained medical scribe. The creation of this  record is based on the scribe's personal observations and the provider's statements to them.   I have reviewed the above documentation for accuracy and completeness, and I agree with the above.

## 2017-05-23 NOTE — Telephone Encounter (Addendum)
This RN spoke with Sonia Hill per need to reschedule appointment on 12/13 to later in day per MD.  Note Sonia Hill states she went to the ER yesterday due to onset of severe right sided epigastric discomfort with nausea and diarrhea.  Presently Sonia Hill is improved with some minimal discomfort with resolution of vomiting and diarrhea. She states her stoma " is really swollen and reddened " . She has not had further diarrhea nor a formed stool. She is passing gas.  Sonia Hill has placed a call to Dr Perley Jain office per above for appropriate advice.  Per call - Sonia Hill states 2 pm would work better for her as well. She understands to call if needed including concerns relating to a blockage in her bowels.

## 2017-05-25 ENCOUNTER — Ambulatory Visit (HOSPITAL_BASED_OUTPATIENT_CLINIC_OR_DEPARTMENT_OTHER): Payer: 59 | Admitting: Oncology

## 2017-05-25 ENCOUNTER — Telehealth: Payer: Self-pay | Admitting: Oncology

## 2017-05-25 VITALS — BP 130/75 | HR 81 | Temp 98.5°F | Resp 18 | Ht 61.0 in | Wt 105.0 lb

## 2017-05-25 DIAGNOSIS — Z933 Colostomy status: Secondary | ICD-10-CM

## 2017-05-25 DIAGNOSIS — C7951 Secondary malignant neoplasm of bone: Secondary | ICD-10-CM | POA: Diagnosis not present

## 2017-05-25 DIAGNOSIS — C50312 Malignant neoplasm of lower-inner quadrant of left female breast: Secondary | ICD-10-CM | POA: Diagnosis not present

## 2017-05-25 DIAGNOSIS — R52 Pain, unspecified: Secondary | ICD-10-CM

## 2017-05-25 DIAGNOSIS — Z170 Estrogen receptor positive status [ER+]: Secondary | ICD-10-CM

## 2017-05-25 DIAGNOSIS — C50912 Malignant neoplasm of unspecified site of left female breast: Secondary | ICD-10-CM

## 2017-05-25 NOTE — Telephone Encounter (Signed)
Gave patient AVS and calendar of upcoming January 2019 appointments. °

## 2017-06-19 ENCOUNTER — Telehealth: Payer: Self-pay | Admitting: *Deleted

## 2017-06-19 ENCOUNTER — Other Ambulatory Visit: Payer: Self-pay | Admitting: *Deleted

## 2017-06-19 ENCOUNTER — Ambulatory Visit
Admission: RE | Admit: 2017-06-19 | Discharge: 2017-06-19 | Disposition: A | Discharge status: Home / Self Care | Payer: 59 | Source: Ambulatory Visit | Attending: Gastroenterology | Admitting: Gastroenterology

## 2017-06-19 ENCOUNTER — Other Ambulatory Visit: Payer: Self-pay | Admitting: Gastroenterology

## 2017-06-19 DIAGNOSIS — R933 Abnormal findings on diagnostic imaging of other parts of digestive tract: Secondary | ICD-10-CM

## 2017-06-19 MED ORDER — ONDANSETRON 4 MG PO TBDP: 4.0000 mg | ORAL_TABLET | Freq: Three times a day (TID) | ORAL | 3 refills | Status: DC | PRN

## 2017-06-19 NOTE — Telephone Encounter (Signed)
This RN spoke with per her call per VM stating " having a critical condition - call me when you can ".  Virtie states ongoing

## 2017-06-20 NOTE — Progress Notes (Signed)
ID: Sonia Hill   DOB: 01/09/1956  MR#: [id number]  HEN#:[id number]  Patient Care Team: [name], MD as PCP - General (Family Medicine) Jacklyne Baik, Virgie Dad, MD as Consulting Physician (Oncology) Molli Posey, MD as Consulting Physician (Obstetrics and Gynecology) Laurence Spates, MD as Consulting Physician (Gastroenterology) Leighton Ruff, MD as Consulting Physician (General Surgery) Delrae Rend, MD as Consulting Physician (Endocrinology) Laureen Abrahams, RN as Registered Nurse (Oncology) Clarene Essex, MD as Consulting Physician (Gastroenterology)   CHIEF COMPLAINT: Metastatic breast cancer, estrogen receptor positive; small bowel obstruction  CURRENT TREATMENT: choosing alternative therapy; refuses bisphosphonates or denosumab  INTERVAL HISTORY: Sonia Hill returns today for follow-up of her 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 She had been on fulvestrant and palbociclib but these were stopped with disease progression.  She then went to [facility] [place] for further discussion of treatment options.  They proceeded to placement of a diverting colostomy since she was developing small bowel obstruction because of the rectal metastases.  At the last visit the patient stated that she was interested in pursuing alternative therapy and did not want to move to chemotherapy at that point.  The patient contacted [place] 06/19/2017 to tell us that she had another episode of SBO. She notes that she wants to try chemo pills or aromatase inhibitor. She notes that due to her digestive condition, she doesn't know how she will properly take any of her medications. She doesn't think that chemotherapy would continually work for her.   Since her last visit, she completed an abdomen x-ray on 06/19/2017 showing: Dilated loops of small bowel are suggestive for a small bowel obstruction.   REVIEW OF SYSTEMS: Sonia Hill reports that she is not doing well.  She notes that she has had trouble eating. She notes having gas pains that builds up in her stomach and back that could last 6-8 hours and does not relieve itself. She notes that if she eats a little food, she vomits because something is blocking her. Because of this, she is only on a liquid diet. She drinks chicken broth, regular water, and water infused with electrolytes. She also notes some issues with diarrhea. She notes that yesterday, she had 3 episodes and stomach growling between yesterday and today. She notes that at night, she can't sleep and she tries to walk to pass the gas. She reports that she tries to eat crackers and a baked potato with butter. When she drinks water, she tries to sip instead of drinking straight. She also tried drinking coconut water. She has not been doing any activities because of her recent conditions. She notes that this is unusual for her. She reports that she hasn't been able to take her medications because they are in pill form and she isn't able to swallow them. She notes that in addition to that, she doesn't have enough food on her stomach to take the medications. She notes that she has had headaches due to the nausea. She notes that when she went to the [facility] in December 2018 in [place], they did an irrigation. She notes that she still feels like she isn't cleaned out. She is wondering if she can get a home care nurse to show her how to irrigate the colon. She is focused on getting relief. She takes xanax, tylenol, and Zofran. She denies unusual headaches, visual changes, nausea, vomiting, or dizziness. There has been no unusual cough, phlegm production, or pleurisy. This been no change in  bowel or bladder habits. She denies unexplained fatigue or unexplained weight loss, bleeding, rash, or fever. A detailed review of systems was otherwise stable.   BREAST CANCER HISTORY:  From the initial intake note:  Sonia Hill had screening mammography on 11/10/2009  showing very dense tissue with a possible distortion in the left breast.  Digital mammography on June 10th showed a 1.5 cm cluster of microcalcifications in the lower inner portion of the breast.  On physical exam, there was no palpable mass and no palpable axillary adenopathy.  There was some nipple inversion inferolaterally.  Ultrasound showed a 2.6 cm irregular mass-like area with dense posterior acoustic shadowing in the 3 o'clock position, 1 cm from the nipple, but normal appearing left axillary lymph nodes.    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  A second mass noted on ultrasound was also lobular, also low grade.  Both were ER and PR positive at well over 90%.  Both had low proliferation markers at 9%, and both were Her-2 negative by CISH with ratios of 1.6 and 1.24.  In short, both masses were nearly identical.    On June 16th, the patient had bilateral breast MRIs which showed in the left breast at 3 o'clock an irregular area of abnormal enhancement measuring up to 2.7 cm and in the left lower inner quadrant, a second post-biopsy area measuring 1.5 cm.  The difference between these two areas was stated at the conference the morning of the visit to be approximately 5 cm.  Given this data the patient opted for Left mastectomy with sentinel lymph node sampling, performed July 2011 with results as detailed below.  Subsequent history is as detailed below.    PAST MEDICAL HISTORY: Past Medical History:  Diagnosis Date  . Arthritis    neck, upper back, shoulders - no meds - yoga  . Chronic kidney disease    Only has right kidney  . Depression    Hx - no current problem  . GERD (gastroesophageal reflux disease)    diet controlled - no meds  . Hyperlipidemia    diet controlled  . Hypothyroidism   . lt breast ca dx'd 11/2009  .  Osteoporosis, unspecified 12/06/2013  . PONV (postoperative nausea and vomiting)   . SVD (spontaneous vaginal delivery)    x 1  1. Osteopenia. 2. Congenital left renal atrophy.  3. History of recurrent UTIs. 4. History of bladder reconstruction for reflux more than 18 years ago. 5. History of depression and anxiety. 6. History of hypothyroidism.  7. History of "sling procedure".  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    FAMILY HISTORY The patient's father is alive in his early 57s.  He has Alzheimer's disease. The patient's mother was diagnosed with breast cancer at the age of 73.  She died at the age of 53.  The patient's father had one out of two sisters with breast cancer.  There are other breast cancers on the mother's side.  The patient has one brother and one younger sister, neither with cancer.    GYNECOLOGIC HISTORY:  (Reviewed 12/06/2013) She is GX P1.  That pregnancy was at age 2.  Menarche was at age 76.  She was perimenopausal at the time of diagnosis but is now convincingly postmenopausal  SOCIAL HISTORY:  (  Reviewed 12/06/2013) She works as a Electrical engineer, part-time and on consultation.  Her husband, [name], is a Architect at [employer].  [name] works as a Pharmacist, hospital in [place].  The patient's son [name] is  in college.  The patient attends [employer].   ADVANCED DIRECTIVES: in place  HEALTH MAINTENANCE: (Updated 12/06/2013)  Social History   Tobacco Use  . Smoking status: Never Smoker  . Smokeless tobacco: Never Used  Substance Use Topics  . Alcohol use: No  . Drug use: No     Colonoscopy: 06/18/2015; [facility]  PAP: UTD/May 2015, Dr. Matthew Saras  Bone density: [name] 2014, osteoporosis  Lipid panel: UTD (Not on file)    Allergies  Allergen Reactions  . Adhesive [Tape] Rash    Ok to use paper tape and tegaderm over IV site  . Betadine [Povidone Iodine] Rash  .  Mercury Rash    Reaction mercurachrome  . Sulfa Antibiotics Rash    Current Outpatient Medications  Medication Sig Dispense Refill  . MAGNESIUM PO Take 325 mg by mouth at bedtime as needed (leg cramps). Magnesium calm - powder    . ondansetron (ZOFRAN ODT) 4 MG disintegrating tablet Take 1 tablet (4 mg total) by mouth every 8 (eight) hours as needed for nausea or vomiting. 30 tablet 3  . OVER THE COUNTER MEDICATION Take 12.5 mg by mouth daily. Iodoral Iodine  supplement    . Probiotic Product (PROBIOTIC DAILY PO) Take 1 tablet by mouth daily.     . Thyroid (NATURE-THROID) 81.25 MG TABS Take 81.25 mg by mouth daily.    Marland Kitchen UNABLE TO FIND Curcumin Liposomal  1000 mg  daily    . UNABLE TO FIND DIM Indol 3-Carbinol  60 mg  2 caps twice daily    . ondansetron (ZOFRAN-ODT) 8 MG disintegrating tablet Take 1 tablet (8 mg total) by mouth every 8 (eight) hours as needed for nausea or vomiting. 40 tablet 1  . promethazine (PHENERGAN) 25 MG suppository Place 1 suppository (25 mg total) rectally every 6 (six) hours as needed for nausea or vomiting. 12 each 0   No current facility-administered medications for this visit.     OBJECTIVE: Middle aged white woman who appears moderately uncomfortable  Vitals:   06/22/17 1503  BP: (!) 157/79  Pulse: 82  Resp: 20  Temp: 98.2 F (36.8 C)  SpO2: 100%     Body mass index is 19.33 kg/m.    ECOG FS: 1 Filed Weights   06/22/17 1503  Weight: 102 lb 4.8 oz (46.4 kg)   Sclerae unicteric, EOMs intact Oropharynx clear and moist No cervical or supraclavicular adenopathy Lungs no rales or rhonchi Heart regular rate and rhythm Abd mildly distended, increased bowel sounds, nontender to moderate palpation, colostomy in place MSK no focal spinal tenderness, no upper extremity lymphedema Neuro: nonfocal, well oriented, appropriate affect Breasts: Deferred  LAB RESULTS:   Lab Results  Component Value Date   WBC 8.5 06/22/2017   NEUTROABS 7.1 (H) 06/22/2017    HGB 14.6 06/22/2017   HCT 41.3 06/22/2017   MCV 87.7 06/22/2017   PLT 424 (H) 06/22/2017      Chemistry      Component Value Date/Time   NA 129 (L) 05/22/2017 1705   NA 134 (L) 12/28/2016 1503   K 4.2 05/22/2017 1705   K 4.0 12/28/2016 1503   CL 96 (L) 05/22/2017 1705   CL 98 12/03/2012 1419   CO2 25 05/22/2017 1705  CO2 25 12/28/2016 1503   BUN 12 05/22/2017 1705   BUN 12.0 12/28/2016 1503   CREATININE 0.72 05/22/2017 1705   CREATININE 0.8 12/28/2016 1503      Component Value Date/Time   CALCIUM 9.2 05/22/2017 1705   CALCIUM 9.7 12/28/2016 1503   ALKPHOS 76 05/22/2017 1705   ALKPHOS 76 12/28/2016 1503   AST 32 05/22/2017 1705   AST 22 12/28/2016 1503   ALT 21 05/22/2017 1705   ALT 20 12/28/2016 1503   BILITOT 0.3 05/22/2017 1705   BILITOT 0.50 12/28/2016 1503      Lab Results  Component Value Date   LABCA2 181 (H) 10/23/2015   STUDIES: Dg Abd 2 Views  Result Date: 06/19/2017 CLINICAL DATA:  Abnormal findings on examination of gastrointestinal tract. Bloating with pain. History of colostomy. EXAM: ABDOMEN - 2 VIEW COMPARISON:  CT 05/22/2017 FINDINGS: Dilated loops of small bowel with air-fluid levels. Large amount of stool in the right colon. There is a colostomy in the left lower abdomen. No evidence for free air on the upright view. Multiple surgical clips in the left lower chest and prior left mastectomy. IMPRESSION: Dilated loops of small bowel are suggestive for a small bowel obstruction. These results will be called to the ordering clinician or representative by the Radiology Department at the imaging location. Electronically Signed   By: Markus Daft M.D.   On: 06/19/2017 13:28     ASSESSMENT: 62 y.o.  BRCA 1-2 negative [facility] woman with lobular breast cancer stage IV at presentation July 2011  (1) status post left mastectomy and sentinel lymph node sampling in July 2011 for a lower inner quadrant T1 N1 M1, stage IV invasive lobular breast cancer, grade 1,  strongly estrogen and progesterone receptor-positive, HER2 negative with MIB-1 of 9% and no HER2 amplification,   (2) with multiple sclerotic bone lesions at presentation seen only on CT scan (not on bone scan or PET scan), but  with biopsy-proven metastatic disease to bone; and an elevated CA 27.29 at presentation,   (3) Oncotype recurrence score of 4, predicts a good response to antiestrogens.  (4) Systemic treatment has consisted of  a) tamoxifen with evidence of response but poor tolerance  b) letrozole starting August 2012, discontinued October 2014 per patient   (5) single functioning kidney  (6) status post bilateral salpingo-oophorectomy 01/24/2013, with benign pathology  (7) osteoporosis; the patient refuses bisphosphonate therapy; started [facility] February 2014.    (a) bone density was obtained under the care of Dr. Matthew Saras at [facility], 06/25/2012, showing osteoporosis with a T score -2.6. This was repeated 12/20/2013, showing again osteoporosis with T-scores between -2.4 and -2.8.  (b) the patient refuses zolendronate, denosumab, or other pharmacologic intervention  (c) bone density on 12/30/2015 under Dr. Matthew Saras showing osteoporosis with T scores between -2.7 and -3.3  (8) the patient refuses standard Mammography or tomography; undergoing thermography screening of the right breast.  (9) PET scan 06/05/2015 shows rectal thickening and a presacral mass; biopsy of this area 10/09/2015 confirms metastatic lobular breast cancer, again estrogen receptor positive, HER-2 nonamplified.  (a) pelvic MRI 03/11/2016 confirms stability of disease  (b) chest CT and pelvic MRI 09/02/2016 shows no change in the circumferential rectal thickening or evidence of extension beyond the serosa and chest CT findings  (c) colonoscopy 02/23/2017 showed a very narrow rectal lumen with a few apparently uninvolved centimeters distally   (10) started fulvestrant and palbociclib  125 mg/ day [21/7] May 2017  (  a) palbociclib dose decreased to 100 mg daily [21/7] with second cycle, started 11/25/2015  (b) palbociclib discontinued 03/20/2017 with evidence of disease progression  (c) last fulvestrant dose 03/24/2017, discontinued with evidence of progression  (11) status post colostomy placement at [facility] [place] [facility] November 2018   PLAN:  I spent a little over an hour today with her husband going over her situation.  She underwent a colostomy because of impending SBO due to cancer involvement of her rectum and that worked initially but now she is having problems with at least a partial small bowel obstruction.  She tells me by keeping herself on liquids (she had 2 crackers and a little bit of a potato yesterday as well as some broth plus water) she was able to have a 3 watery brown "movements" into her colostomy bag today.  She is losing weight on her scales, although she is stable on hours.  She is obviously very uncomfortable.  We discussed standard management of bowel obstruction which means placement of an NG tube to continuous suction and intravenous fluids.  She is very reluctant to do this but she knows if the pain really gets bad she will have to go to the emergency room and that is what will be done at that time.  For now she wants to continue to hydrate herself.  She is drinking and electrolyte solution.  If she eats anything it should be the simplest carbohydrates and we discussed potatoes without butter, bread without butter, rice, and pasta.  Anything oily fried or buttery she needs to stay away from if she is able to keep carbohydrates down easily then she can add proteins.  Were going to control the nausea with ODT Zofran.  I also gave her a prescription for Phenergan suppositories in case the Zofran is not effective.  She tells me they irrigated her ostomy at [facility] [place] and that that was helpful.  She tells me Dr. Lu Duffel got may be interested  at some point in going through the colostomy with a small colonoscope to see if he can "open things up".  Either of those options would be fine with me.  We are going to see if home health can send an ostomy nurse to her home so she can learn to do her own irrigations.  We again discussed possible treatments for her cancer.  They include capecitabine, other chemotherapy, or the combination of exemestane/everolimus.  She is interested in the latter and we discussed that at length today.  Which is a good understanding of the possible complications, toxicities and side effects particularly of M tor inhibitors.  At this point however she does not feel she can take any pills.  She would like to get stronger.  I let her know I am concerned if she does not get treatment she will not get stronger but weaker and the problem in her abdomen will not get easier but harder.  In any case at this point what she would like to do is continue to hydrate herself and see if she can advance her diet with the help of irrigation.  She will return to see me in 2 weeks.  She knows to call for any other issues that may develop before that visit.    Raelea Gosse, Virgie Dad, MD  06/22/17 3:34 PM Medical Oncology and Hematology [facility] [address] [place], [place] [address] Tel. [phone]    Fax. [phone]  This document serves as  a record of services personally performed by Lurline Del, MD. It was created on his behalf by Sheron Nightingale, a trained medical scribe. The creation of this record is based on the scribe's personal observations and the provider's statements to them.   I have reviewed the above documentation for accuracy and completeness, and I agree with the above.

## 2017-06-22 ENCOUNTER — Inpatient Hospital Stay: Payer: 59 | Admitting: Oncology

## 2017-06-22 ENCOUNTER — Telehealth: Payer: Self-pay | Admitting: Oncology

## 2017-06-22 ENCOUNTER — Inpatient Hospital Stay: Payer: 59 | Attending: Oncology

## 2017-06-22 VITALS — BP 157/79 | HR 82 | Temp 98.2°F | Resp 20 | Wt 102.3 lb

## 2017-06-22 DIAGNOSIS — Z87440 Personal history of urinary (tract) infections: Secondary | ICD-10-CM

## 2017-06-22 DIAGNOSIS — E785 Hyperlipidemia, unspecified: Secondary | ICD-10-CM | POA: Diagnosis not present

## 2017-06-22 DIAGNOSIS — E46 Unspecified protein-calorie malnutrition: Secondary | ICD-10-CM | POA: Insufficient documentation

## 2017-06-22 DIAGNOSIS — K56609 Unspecified intestinal obstruction, unspecified as to partial versus complete obstruction: Secondary | ICD-10-CM | POA: Insufficient documentation

## 2017-06-22 DIAGNOSIS — E039 Hypothyroidism, unspecified: Secondary | ICD-10-CM | POA: Insufficient documentation

## 2017-06-22 DIAGNOSIS — R51 Headache: Secondary | ICD-10-CM | POA: Insufficient documentation

## 2017-06-22 DIAGNOSIS — C785 Secondary malignant neoplasm of large intestine and rectum: Secondary | ICD-10-CM | POA: Insufficient documentation

## 2017-06-22 DIAGNOSIS — I219 Acute myocardial infarction, unspecified: Secondary | ICD-10-CM

## 2017-06-22 DIAGNOSIS — E871 Hypo-osmolality and hyponatremia: Secondary | ICD-10-CM | POA: Insufficient documentation

## 2017-06-22 DIAGNOSIS — Z170 Estrogen receptor positive status [ER+]: Secondary | ICD-10-CM | POA: Diagnosis not present

## 2017-06-22 DIAGNOSIS — C50912 Malignant neoplasm of unspecified site of left female breast: Secondary | ICD-10-CM

## 2017-06-22 DIAGNOSIS — C7951 Secondary malignant neoplasm of bone: Secondary | ICD-10-CM | POA: Diagnosis not present

## 2017-06-22 DIAGNOSIS — K219 Gastro-esophageal reflux disease without esophagitis: Secondary | ICD-10-CM | POA: Diagnosis not present

## 2017-06-22 DIAGNOSIS — Z803 Family history of malignant neoplasm of breast: Secondary | ICD-10-CM | POA: Diagnosis not present

## 2017-06-22 DIAGNOSIS — C50919 Malignant neoplasm of unspecified site of unspecified female breast: Secondary | ICD-10-CM

## 2017-06-22 DIAGNOSIS — M129 Arthropathy, unspecified: Secondary | ICD-10-CM | POA: Diagnosis not present

## 2017-06-22 DIAGNOSIS — Z933 Colostomy status: Secondary | ICD-10-CM

## 2017-06-22 DIAGNOSIS — F329 Major depressive disorder, single episode, unspecified: Secondary | ICD-10-CM | POA: Insufficient documentation

## 2017-06-22 DIAGNOSIS — E440 Moderate protein-calorie malnutrition: Secondary | ICD-10-CM

## 2017-06-22 DIAGNOSIS — M810 Age-related osteoporosis without current pathological fracture: Secondary | ICD-10-CM | POA: Diagnosis not present

## 2017-06-22 DIAGNOSIS — Z9012 Acquired absence of left breast and nipple: Secondary | ICD-10-CM | POA: Insufficient documentation

## 2017-06-22 DIAGNOSIS — M8580 Other specified disorders of bone density and structure, unspecified site: Secondary | ICD-10-CM | POA: Diagnosis not present

## 2017-06-22 DIAGNOSIS — R110 Nausea: Secondary | ICD-10-CM | POA: Diagnosis not present

## 2017-06-22 DIAGNOSIS — C50312 Malignant neoplasm of lower-inner quadrant of left female breast: Secondary | ICD-10-CM

## 2017-06-22 DIAGNOSIS — Z79899 Other long term (current) drug therapy: Secondary | ICD-10-CM | POA: Insufficient documentation

## 2017-06-22 DIAGNOSIS — R140 Abdominal distension (gaseous): Secondary | ICD-10-CM

## 2017-06-22 DIAGNOSIS — F419 Anxiety disorder, unspecified: Secondary | ICD-10-CM | POA: Diagnosis not present

## 2017-06-22 DIAGNOSIS — R630 Anorexia: Secondary | ICD-10-CM

## 2017-06-22 LAB — CBC WITH DIFFERENTIAL/PLATELET
BASOS ABS: 0 10*3/uL (ref 0.0–0.1)
BASOS PCT: 0 %
EOS PCT: 0 %
Eosinophils Absolute: 0 10*3/uL (ref 0.0–0.5)
HCT: 41.3 % (ref 34.8–46.6)
HEMOGLOBIN: 14.6 g/dL (ref 11.6–15.9)
LYMPHS ABS: 0.8 10*3/uL — AB (ref 0.9–3.3)
Lymphocytes Relative: 9 %
MCH: 31 pg (ref 25.1–34.0)
MCHC: 35.4 g/dL (ref 31.5–36.0)
MCV: 87.7 fL (ref 79.5–101.0)
Monocytes Absolute: 0.6 10*3/uL (ref 0.1–0.9)
Monocytes Relative: 7 %
NEUTROS PCT: 84 %
Neutro Abs: 7.1 10*3/uL — ABNORMAL HIGH (ref 1.5–6.5)
PLATELETS: 424 10*3/uL — AB (ref 145–400)
RBC: 4.7 MIL/uL (ref 3.70–5.45)
RDW: 12.5 % (ref 11.2–16.1)
WBC: 8.5 10*3/uL (ref 3.9–10.3)

## 2017-06-22 LAB — CEA (IN HOUSE-CHCC): CEA (CHCC-In House): 161.04 ng/mL — ABNORMAL HIGH (ref 0.00–5.00)

## 2017-06-22 MED ORDER — PROMETHAZINE HCL 25 MG RE SUPP: 25.0000 mg | Freq: Four times a day (QID) | RECTAL | 0 refills | Status: DC | PRN

## 2017-06-22 MED ORDER — ONDANSETRON 8 MG PO TBDP: 8.0000 mg | ORAL_TABLET | Freq: Three times a day (TID) | ORAL | 1 refills | Status: DC | PRN

## 2017-06-22 NOTE — Telephone Encounter (Signed)
Gave patient AVs and calendar of upcoming January appointments.  °

## 2017-06-23 ENCOUNTER — Telehealth: Payer: Self-pay | Admitting: *Deleted

## 2017-06-23 LAB — CANCER ANTIGEN 27.29: CAN 27.29: 280.3 U/mL — AB (ref 0.0–38.6)

## 2017-06-23 NOTE — Telephone Encounter (Signed)
This RN faxed order for [facility] to [facility] ( pt had prior services under them ).  Pt made aware of above- note per phone discussion this AM - she states she is continuing to have nausea despite use of Zofran and phenergan. She inquired " is there something stronger

## 2017-06-26 ENCOUNTER — Other Ambulatory Visit: Payer: Self-pay | Admitting: *Deleted

## 2017-06-26 ENCOUNTER — Telehealth: Payer: Self-pay | Admitting: *Deleted

## 2017-06-26 NOTE — Telephone Encounter (Signed)
This RN returned call to Leeann Must with [facility] at [phone] per her VM.  Malachy Mood states she will visit the patient today " to irrigate her ostomy  - but need to know what fluid and how much you want [place] to flush with "  This RN stated order sent to [facility] last week was for an Ostomy nurse consult who can advise on above issues.  Malachy Mood states she will contact the Ostomy nurse with [facility].  Note order faxed stated " Ostomy Nurse consult and services for teaching of irrigation of colostomy "

## 2017-06-28 ENCOUNTER — Other Ambulatory Visit: Payer: Self-pay | Admitting: Oncology

## 2017-06-28 ENCOUNTER — Other Ambulatory Visit: Payer: Self-pay | Admitting: Gastroenterology

## 2017-06-28 ENCOUNTER — Telehealth: Payer: Self-pay | Admitting: *Deleted

## 2017-06-28 DIAGNOSIS — R9389 Abnormal findings on diagnostic imaging of other specified body structures: Secondary | ICD-10-CM

## 2017-06-28 DIAGNOSIS — R112 Nausea with vomiting, unspecified: Secondary | ICD-10-CM

## 2017-06-28 MED ORDER — TRAMADOL HCL 50 MG PO TABS: 50.0000 mg | ORAL_TABLET | Freq: Four times a day (QID) | ORAL | 1 refills | Status: DC | PRN

## 2017-06-28 MED ORDER — BACLOFEN 20 MG PO TABS: 20.0000 mg | ORAL_TABLET | Freq: Three times a day (TID) | ORAL | 1 refills | Status: DC

## 2017-06-28 MED ORDER — GABAPENTIN 300 MG PO CAPS: 300.0000 mg | ORAL_CAPSULE | Freq: Three times a day (TID) | ORAL | 1 refills | Status: DC | PRN

## 2017-06-28 NOTE — Progress Notes (Signed)
Discussed Julli situation with Dr. May God today.  He understands that we have offered Sonia Hill chemotherapy which is what she really needs but that for now she refuses.  She is trying alternative treatments.  He is considering a Gastrografin small bowel to see if there is an obstruction and where it is.  If it is close to the ostomy possibly he could try to balloon it.  She might need laparoscopy.  He tells me since she had her surgery in [place] she might need to go back there for more invasive procedures or relooks

## 2017-06-28 NOTE — Telephone Encounter (Signed)
This RN returned call to pt per her concerns with need for irrigation of colostomy " and no one in this area seems to know how to do this "  Sonia Hill has contacted the [facility] where she had surgery and they are sending her a kit and links of videos for teaching.  Sonia Hill states pain is increasing - now interfering with her ADL's and sleep.  This RN addressed concerns regarding irrigation being done by watching videos due to possible unknown issues- including SBO vs tumor burden for cause of changes in her bowels.  Sonia Hill states she was seen by Dr Watt Climes last week and advised to go to liquid diet and follow up with Dr Jannifer Rodney- Sonia Hill is doing a liquid diet with little benefit.  Per visit with Dr [name] advised proceeding to chemotherapy which pt declined presently stating " I feel like I need to get stronger ".   Plan per above is pt will contact Dr Watt Climes office for follow up and possible interventions for evaluation of cause of blockage  " I have accepted the fact that I have cancer and if this is cancer then I will just have to deal with it " Sonia Hill would like to know " what " is causing the blockage.  For her discomfort - and per MD review - prescriptions for baclofen, tramadol and gabapentin .  This RN contacted Dr Perley Jain office and spoke with his nurse- Barb per above.  Plan per call is she will review with Dr Watt Climes - who may contact Dr Jannifer Rodney. Dr Gerarda Fraction direct cell number given.

## 2017-07-03 ENCOUNTER — Ambulatory Visit
Admission: RE | Admit: 2017-07-03 | Discharge: 2017-07-03 | Disposition: A | Discharge status: Home / Self Care | Payer: 59 | Source: Ambulatory Visit | Attending: Gastroenterology | Admitting: Gastroenterology

## 2017-07-03 ENCOUNTER — Telehealth: Payer: Self-pay | Admitting: *Deleted

## 2017-07-03 DIAGNOSIS — R112 Nausea with vomiting, unspecified: Secondary | ICD-10-CM

## 2017-07-03 DIAGNOSIS — R9389 Abnormal findings on diagnostic imaging of other specified body structures: Secondary | ICD-10-CM

## 2017-07-03 NOTE — Progress Notes (Signed)
ID: Sonia Hill   DOB: 1955/09/21  MR#: [id number]  HAL#:[id number]  Patient Care Team: [name], MD as PCP - General (Family Medicine) Draya Felker, Virgie Dad, MD as Consulting Physician (Oncology) Molli Posey, MD as Consulting Physician (Obstetrics and Gynecology) Laurence Spates, MD as Consulting Physician (Gastroenterology) Leighton Ruff, MD as Consulting Physician (General Surgery) Delrae Rend, MD as Consulting Physician (Endocrinology) Laureen Abrahams, RN as Registered Nurse (Oncology) Clarene Essex, MD as Consulting Physician (Gastroenterology)   CHIEF COMPLAINT: Metastatic breast cancer, estrogen receptor positive  CURRENT TREATMENT: exemestane, everolimus; refuses bisphosphonates or denosumab  INTERVAL HISTORY: Sonia Hill returns today for follow-up of her 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 She notes that she is very tired today.  Since her last visit, she completed an x-ray of the small bowels on 07/03/2017 showing: Rapid transit of contrast through the small bowel, reaching the colon within 30 min of initial ingestion. No evidence of bowel obstruction. There are few transiently dilated loops of small bowel and prominent peristalsis at fluoroscopy.   REVIEW OF SYSTEMS: Sonia Hill reports that she took Xanax and baclofen, and it was difficult for her to wake up this morning. She notes that she feel very tired today. She reports that she feels tugging and pulling in her stomach at times. She notes that she gets painful gas buildup and bloating that last for an upwards of 8 hours with out relief. She notes that the pain is severe and shoots up her back.  She reports that she tries to walk to relieve the gas, but eventually she gets tired because it lasts for a long time. She notes some gurgling and noise in her stomach. She notes that her diet consists of bone broth, potatoes, and baby food as of recent. She notes that she loves creme  potatoes and mixes it with low sodium bone broth. She has lost over 10 lbs, but she is slowly trying to gain her weight back. She notes that she doesn't tolerate dairy, but she is able to drink Cashew milk. She is trying to get nutrients and proteins and fats without consuming too much sugar. She notes that she is trying to limit the amount of sugar because she wants to "starve" the cancer. She reports that she emptied her colostomy bag this morning and the contents looked like "carrot juice" with an orange color. She notes that last time, it looked like applesauce. She denies unusual headaches, visual changes, nausea, vomiting, or dizziness. There has been no unusual cough, phlegm production, or pleurisy. This been no change in bowel or bladder habits. She denies unexplained fatigue or unexplained weight loss, bleeding, rash, or fever. A detailed review of systems was otherwise stable.   BREAST CANCER HISTORY:  From the initial intake note:  Sonia Hill had screening mammography on 11/10/2009 showing very dense tissue with a possible distortion in the left breast.  Digital mammography on June 10th showed a 1.5 cm cluster of microcalcifications in the lower inner portion of the breast.  On physical exam, there was no palpable mass and no palpable axillary adenopathy.  There was some nipple inversion inferolaterally.  Ultrasound showed a 2.6 cm irregular mass-like area with dense posterior acoustic shadowing in the 3 o'clock position, 1 cm from the nipple, but normal appearing left axillary lymph nodes.    The patient was brought back for biopsy of the mass in question on June 14th and the pathology ([id number]) showed an invasive lobular breast cancer (E-cadherin negative)  apparently low grade.  A second mass noted on ultrasound was also lobular, also low grade.  Both were ER and PR positive at well over 90%.  Both had low proliferation markers at 9%, and both were Her-2 negative by CISH with ratios of 1.6 and  1.24.  In short, both masses were nearly identical.    On June 16th, the patient had bilateral breast MRIs which showed in the left breast at 3 o'clock an irregular area of abnormal enhancement measuring up to 2.7 cm and in the left lower inner quadrant, a second post-biopsy area measuring 1.5 cm.  The difference between these two areas was stated at the conference the morning of the visit to be approximately 5 cm.  Given this data the patient opted for Left mastectomy with sentinel lymph node sampling, performed July 2011 with results as detailed below.  Subsequent history is as detailed below.    PAST MEDICAL HISTORY: Past Medical History:  Diagnosis Date  . Arthritis    neck, upper back, shoulders - no meds - yoga  . Chronic kidney disease    Only has right kidney  . Depression    Hx - no current problem  . GERD (gastroesophageal reflux disease)    diet controlled - no meds  . Hyperlipidemia    diet controlled  . Hypothyroidism   . lt breast ca dx'd 11/2009  . Osteoporosis, unspecified 12/06/2013  . PONV (postoperative nausea and vomiting)   . SVD (spontaneous vaginal delivery)    x 1  1. Osteopenia. 2. Congenital left renal atrophy.  3. History of recurrent UTIs. 4. History of bladder reconstruction for reflux more than 18 years ago. 5. History of depression and anxiety. 6. History of hypothyroidism.  7. History of "sling procedure".  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    FAMILY HISTORY The patient's father is alive in his early 78s.  He has Alzheimer's disease. The patient's mother was diagnosed with breast cancer at the age of 63.  She died at the age of 35.  The patient's father had one out of two sisters with breast cancer.  There are other breast cancers on the mother's side.  The patient has one brother and one younger sister, neither with cancer.    GYNECOLOGIC HISTORY:  (Reviewed 12/06/2013) She  is GX P1.  That pregnancy was at age 74.  Menarche was at age 57.  She was perimenopausal at the time of diagnosis but is now convincingly postmenopausal  SOCIAL HISTORY:  (Reviewed 12/06/2013) She works as a Electrical engineer, part-time and on consultation.  Her husband, [name], is a Architect at [employer].  [name] works as a Pharmacist, hospital in [place].  The patient's son [name] is  in college.  The patient attends [employer].   ADVANCED DIRECTIVES: in place  HEALTH MAINTENANCE: (Updated 12/06/2013)  Social History   Tobacco Use  . Smoking status: Never Smoker  . Smokeless tobacco: Never Used  Substance Use Topics  . Alcohol use: No  . Drug use: No     Colonoscopy: 06/18/2015; [facility]  PAP: UTD/May 2015, Dr. Matthew Saras  Bone density: [name] 2014, osteoporosis  Lipid panel: UTD (Not on file)    Allergies  Allergen Reactions  . Adhesive [Tape] Rash    Ok to use paper tape and tegaderm over IV site  . Betadine [Povidone Iodine] Rash  . Mercury Rash    Reaction  mercurachrome  . Sulfa Antibiotics Rash    Current Outpatient Medications  Medication Sig Dispense Refill  . baclofen (LIORESAL) 20 MG tablet Take 1 tablet (20 mg total) by mouth 3 (three) times daily. 60 each 1  . gabapentin (NEURONTIN) 300 MG capsule Take 1 capsule (300 mg total) by mouth 3 (three) times daily as needed. 90 capsule 1  . MAGNESIUM PO Take 325 mg by mouth at bedtime as needed (leg cramps). Magnesium calm - powder    . ondansetron (ZOFRAN ODT) 4 MG disintegrating tablet Take 1 tablet (4 mg total) by mouth every 8 (eight) hours as needed for nausea or vomiting. 30 tablet 3  . ondansetron (ZOFRAN-ODT) 8 MG disintegrating tablet Take 1 tablet (8 mg total) by mouth every 8 (eight) hours as needed for nausea or vomiting. 40 tablet 1  . OVER THE COUNTER MEDICATION Take 12.5 mg by mouth daily. Iodoral Iodine  supplement    . Probiotic Product (PROBIOTIC DAILY PO) Take 1 tablet by mouth  daily.     . promethazine (PHENERGAN) 25 MG suppository Place 1 suppository (25 mg total) rectally every 6 (six) hours as needed for nausea or vomiting. 12 each 0  . Thyroid (NATURE-THROID) 81.25 MG TABS Take 81.25 mg by mouth daily.    . traMADol (ULTRAM) 50 MG tablet Take 1 tablet (50 mg total) by mouth every 6 (six) hours as needed. 60 tablet 1  . UNABLE TO FIND Curcumin Liposomal  1000 mg  daily    . UNABLE TO FIND DIM Indol 3-Carbinol  60 mg  2 caps twice daily     No current facility-administered medications for this visit.     OBJECTIVE: Middle aged white woman in no acute distress  Vitals:   07/05/17 1208  BP: 124/76  Pulse: 79  Resp: 19  Temp: 97.6 F (36.4 C)  SpO2: 100%     Body mass index is 18.27 kg/m.    ECOG FS: 1 Filed Weights   07/05/17 1208  Weight: 96 lb 11.2 oz (43.9 kg)   Sclerae unicteric, pupils round and equal Oropharynx clear and moist No cervical or supraclavicular adenopathy Lungs no rales or rhonchi Heart regular rate and rhythm Abd soft, nontender, increased bowel sounds, colostomy intact MSK no focal spinal tenderness, no upper extremity lymphedema Neuro: nonfocal, well oriented, appropriate affect Breasts: Deferred  LAB RESULTS:   Lab Results  Component Value Date   WBC 8.5 06/22/2017   NEUTROABS 7.1 (H) 06/22/2017   HGB 14.6 06/22/2017   HCT 41.3 06/22/2017   MCV 87.7 06/22/2017   PLT 424 (H) 06/22/2017      Chemistry      Component Value Date/Time   NA 129 (L) 05/22/2017 1705   NA 134 (L) 12/28/2016 1503   K 4.2 05/22/2017 1705   K 4.0 12/28/2016 1503   CL 96 (L) 05/22/2017 1705   CL 98 12/03/2012 1419   CO2 25 05/22/2017 1705   CO2 25 12/28/2016 1503   BUN 12 05/22/2017 1705   BUN 12.0 12/28/2016 1503   CREATININE 0.72 05/22/2017 1705   CREATININE 0.8 12/28/2016 1503      Component Value Date/Time   CALCIUM 9.2 05/22/2017 1705   CALCIUM 9.7 12/28/2016 1503   ALKPHOS 76 05/22/2017 1705   ALKPHOS 76 12/28/2016 1503    AST 32 05/22/2017 1705   AST 22 12/28/2016 1503   ALT 21 05/22/2017 1705   ALT 20 12/28/2016 1503   BILITOT 0.3 05/22/2017 1705  BILITOT 0.50 12/28/2016 1503      STUDIES: Dg Small Bowel  Result Date: 07/03/2017 CLINICAL DATA:  Metastatic breast cancer with rectal metastases. Diverting colostomy. Small bowel obstruction. EXAM: SMALL BOWEL SERIES COMPARISON:  CT 05/22/2017.  Radiographs 06/19/2017. TECHNIQUE: Following ingestion of thin barium, serial small bowel images were obtained including spot views of the terminal ileum. FLUOROSCOPY TIME:  Fluoroscopy Time: 1 min and 6 sec of low-dose pulsed fluoro Radiation Exposure Index (if provided by the fluoroscopic device): 60 mGy Number of Acquired Spot Images: 0 FINDINGS: The scout abdominal radiograph demonstrates improvement in the previously demonstrated small bowel dilatation. No significant dilated loops of small bowel are seen. There is stool in the right colon. Left-sided colostomy noted. The patient drank the water-soluble contrast without difficulty. Overhead abdominal radiographs were obtained at 10 and 30 min supplemented by fluoroscopic observation and spot images. The stomach and proximal small bowel appear normal. Contrast reaches the colon within 30 min of initial ingestion. The small bowel appears foreshortened, suggesting previous partial small bowel resection. There are few mildly dilated loops of small bowel in the right mid abdomen. At fluoroscopy, these are not fixed. There prominent small bowel peristalsis at fluoroscopy, recorded on series 3 and series 5. The terminal ileum was difficult to isolate, although appears grossly normal. No contrast extravasation. IMPRESSION: 1. Rapid transit of contrast through the small bowel, reaching the colon within 30 min of initial ingestion. No evidence of bowel obstruction. 2. There are few transiently dilated loops of small bowel and prominent peristalsis at fluoroscopy. Electronically Signed    By: Richardean Sale M.D.   On: 07/03/2017 12:37   Dg Abd 2 Views  Result Date: 06/19/2017 CLINICAL DATA:  Abnormal findings on examination of gastrointestinal tract. Bloating with pain. History of colostomy. EXAM: ABDOMEN - 2 VIEW COMPARISON:  CT 05/22/2017 FINDINGS: Dilated loops of small bowel with air-fluid levels. Large amount of stool in the right colon. There is a colostomy in the left lower abdomen. No evidence for free air on the upright view. Multiple surgical clips in the left lower chest and prior left mastectomy. IMPRESSION: Dilated loops of small bowel are suggestive for a small bowel obstruction. These results will be called to the ordering clinician or representative by the Radiology Department at the imaging location. Electronically Signed   By: Markus Daft M.D.   On: 06/19/2017 13:28     ASSESSMENT: 62 y.o.  BRCA 1-2 negative [facility] woman with lobular breast cancer stage IV at presentation July 2011  (1) status post left mastectomy and sentinel lymph node sampling in July 2011 for a lower inner quadrant T1 N1 M1, stage IV invasive lobular breast cancer, grade 1, strongly estrogen and progesterone receptor-positive, HER2 negative with MIB-1 of 9% and no HER2 amplification,   (2) with multiple sclerotic bone lesions at presentation seen only on CT scan (not on bone scan or PET scan), but  with biopsy-proven metastatic disease to bone; and an elevated CA 27.29 at presentation,   (3) Oncotype recurrence score of 4, predicts a good response to antiestrogens.  (4) Systemic treatment has consisted of  a) tamoxifen with evidence of response but poor tolerance  b) letrozole starting August 2012, discontinued October 2014 per patient   (5) single functioning kidney  (6) status post bilateral salpingo-oophorectomy 01/24/2013, with benign pathology  (7) osteoporosis; the patient refuses bisphosphonate therapy; started [facility] February 2014.    (a) bone density was obtained under  the care of Dr. Matthew Saras  at [facility], 06/25/2012, showing osteoporosis with a T score -2.6. This was repeated 12/20/2013, showing again osteoporosis with T-scores between -2.4 and -2.8.  (b) the patient refuses zolendronate, denosumab, or other pharmacologic intervention  (c) bone density on 12/30/2015 under Dr. Matthew Saras showing osteoporosis with T scores between -2.7 and -3.3  (8) the patient refuses standard Mammography or tomography; undergoing thermography screening of the right breast.  (9) PET scan 06/05/2015 shows rectal thickening and a presacral mass; biopsy of this area 10/09/2015 confirms metastatic lobular breast cancer, again estrogen receptor positive, HER-2 nonamplified.  (a) pelvic MRI 03/11/2016 confirms stability of disease  (b) chest CT and pelvic MRI 09/02/2016 shows no change in the circumferential rectal thickening or evidence of extension beyond the serosa and chest CT findings  (c) colonoscopy 02/23/2017 showed a very narrow rectal lumen with a few apparently uninvolved centimeters distally   (d) CT of the abdomen and pelvis 05/22/2017 (after diverting colostomy) shows interval increase in the size of the perirectal soft tissue masses.  (10) started fulvestrant and palbociclib 125 mg/ day [21/7] May 2017  (a) palbociclib dose decreased to 100 mg daily [21/7] with second cycle, started 11/25/2015  (b) palbociclib discontinued 03/20/2017 with evidence of disease progression  (c) last fulvestrant dose 03/24/2017, discontinued with evidence of progression  (11) status post colostomy [facility] November 2018  (12) started exemestane 07/05/2017, everolimus to follow  PLAN:  Sonia Hill's situation is difficult and I spent approximately 1 hour again today with her and her husband.  She has cancer involving the lower pelvis, with lower intestinal obstruction which is the reason she was diverted.  The dye version could not be made  through the sigmoid, but more proximally, so she lost perhaps one third of the function of her colon.  She understands the main purpose of the colon is to reabsorb water and salt.  She is generally mildly hyponatremic.  She understands that she should not be on a salt restricted diet at this point.  Fortunately there is no evidence of obstruction at this time.  She remains poorly malnourished.  She is convinced that she knows how to deal with this and was very resistant to referral to our nutritionist, but I insisted that she meets somebody who can give her information on exactly what she can absorb most effectively at this point given her dietary restrictions and needs.  She continues to lose weight as she has been there is going to be very little we can do in terms of treatment.  We again discussed the difference between chemotherapy and further antiestrogen manipulation, and we also considered ready.  I see very little role for radiation at this point.  She has no symptoms related to her lower pelvic metastatic disease.  Radiation can be used to palliate symptoms per there are currently no symptoms there to palliate.  Accordingly I think we should hold off on that and she very much does not want to receive radiation unless it is absolutely necessary.  We discussed capecitabine versus CMF and what she prefers is to give aromatase inhibitors to try.  She was not able to tolerate these previously but we will try exemestane.  The prescription was placed today.  I also placed a prescription for everolimus at 5 mg daily.  Once we obtain that she will be contacted by our oral chemotherapy pharmacist for further instructions.  We are going to continue to follow the CA-27-29 on a  monthly basis and she is going to see me again on July 28, 2017 to make sure she is tolerating treatment well.  She knows to call for any other issues that may develop before the next visit    Zhoey Blackstock, Virgie Dad, MD   07/05/17 12:31 PM Medical Oncology and Hematology [facility] [place], [place] [address] Tel. [phone]    Fax. [phone]  This document serves as a record of services personally performed by Lurline Del, MD. It was created on his behalf by Sheron Nightingale, a trained medical scribe. The creation of this record is based on the scribe's personal observations and the provider's statements to them.     I have reviewed the above documentation for accuracy and completeness, and I agree with the above.

## 2017-07-03 NOTE — Telephone Encounter (Signed)
Received vm call from pt stating that she had her small bowel follow through today & she doesn't have a blockage & everything looks good.  She states there is an area in small bowel that is distended.  She tried to call Dr Watt Climes 's office to see if he could do a colonoscopy while she is cleaned out but was unable to get in touch with him or his nurse.  She reports that she thinks she needs to have this to eval the distended area.  Message given to Dr [name].  Pt will see Dr [name] Wednesday.

## 2017-07-05 ENCOUNTER — Inpatient Hospital Stay: Payer: 59

## 2017-07-05 ENCOUNTER — Inpatient Hospital Stay: Payer: 59 | Admitting: Oncology

## 2017-07-05 ENCOUNTER — Telehealth: Payer: Self-pay | Admitting: Pharmacy Technician

## 2017-07-05 ENCOUNTER — Telehealth: Payer: Self-pay | Admitting: Oncology

## 2017-07-05 VITALS — BP 124/76 | HR 79 | Temp 97.6°F | Resp 19 | Ht 61.0 in | Wt 96.7 lb

## 2017-07-05 DIAGNOSIS — F419 Anxiety disorder, unspecified: Secondary | ICD-10-CM

## 2017-07-05 DIAGNOSIS — Z79899 Other long term (current) drug therapy: Secondary | ICD-10-CM

## 2017-07-05 DIAGNOSIS — E440 Moderate protein-calorie malnutrition: Secondary | ICD-10-CM

## 2017-07-05 DIAGNOSIS — Z170 Estrogen receptor positive status [ER+]: Secondary | ICD-10-CM

## 2017-07-05 DIAGNOSIS — M129 Arthropathy, unspecified: Secondary | ICD-10-CM

## 2017-07-05 DIAGNOSIS — M810 Age-related osteoporosis without current pathological fracture: Secondary | ICD-10-CM

## 2017-07-05 DIAGNOSIS — R51 Headache: Secondary | ICD-10-CM

## 2017-07-05 DIAGNOSIS — F329 Major depressive disorder, single episode, unspecified: Secondary | ICD-10-CM

## 2017-07-05 DIAGNOSIS — I219 Acute myocardial infarction, unspecified: Secondary | ICD-10-CM

## 2017-07-05 DIAGNOSIS — C7951 Secondary malignant neoplasm of bone: Principal | ICD-10-CM

## 2017-07-05 DIAGNOSIS — Z87440 Personal history of urinary (tract) infections: Secondary | ICD-10-CM

## 2017-07-05 DIAGNOSIS — R110 Nausea: Secondary | ICD-10-CM

## 2017-07-05 DIAGNOSIS — Z9012 Acquired absence of left breast and nipple: Secondary | ICD-10-CM

## 2017-07-05 DIAGNOSIS — E871 Hypo-osmolality and hyponatremia: Secondary | ICD-10-CM | POA: Diagnosis not present

## 2017-07-05 DIAGNOSIS — C50312 Malignant neoplasm of lower-inner quadrant of left female breast: Secondary | ICD-10-CM

## 2017-07-05 DIAGNOSIS — C50912 Malignant neoplasm of unspecified site of left female breast: Secondary | ICD-10-CM

## 2017-07-05 DIAGNOSIS — C785 Secondary malignant neoplasm of large intestine and rectum: Secondary | ICD-10-CM

## 2017-07-05 DIAGNOSIS — M818 Other osteoporosis without current pathological fracture: Secondary | ICD-10-CM

## 2017-07-05 DIAGNOSIS — Z933 Colostomy status: Secondary | ICD-10-CM

## 2017-07-05 DIAGNOSIS — C50919 Malignant neoplasm of unspecified site of unspecified female breast: Secondary | ICD-10-CM

## 2017-07-05 DIAGNOSIS — K56609 Unspecified intestinal obstruction, unspecified as to partial versus complete obstruction: Secondary | ICD-10-CM | POA: Diagnosis not present

## 2017-07-05 DIAGNOSIS — K219 Gastro-esophageal reflux disease without esophagitis: Secondary | ICD-10-CM

## 2017-07-05 DIAGNOSIS — R630 Anorexia: Secondary | ICD-10-CM

## 2017-07-05 DIAGNOSIS — R140 Abdominal distension (gaseous): Secondary | ICD-10-CM

## 2017-07-05 DIAGNOSIS — M8580 Other specified disorders of bone density and structure, unspecified site: Secondary | ICD-10-CM | POA: Diagnosis not present

## 2017-07-05 DIAGNOSIS — Z803 Family history of malignant neoplasm of breast: Secondary | ICD-10-CM

## 2017-07-05 DIAGNOSIS — E039 Hypothyroidism, unspecified: Secondary | ICD-10-CM

## 2017-07-05 DIAGNOSIS — E785 Hyperlipidemia, unspecified: Secondary | ICD-10-CM

## 2017-07-05 LAB — CBC WITH DIFFERENTIAL/PLATELET
BASOS PCT: 0 %
Basophils Absolute: 0 10*3/uL (ref 0.0–0.1)
EOS PCT: 1 %
Eosinophils Absolute: 0.1 10*3/uL (ref 0.0–0.5)
HCT: 40.9 % (ref 34.8–46.6)
Hemoglobin: 13.8 g/dL (ref 11.6–15.9)
LYMPHS ABS: 1 10*3/uL (ref 0.9–3.3)
Lymphocytes Relative: 16 %
MCH: 30.4 pg (ref 25.1–34.0)
MCHC: 33.7 g/dL (ref 31.5–36.0)
MCV: 90.1 fL (ref 79.5–101.0)
Monocytes Absolute: 0.4 10*3/uL (ref 0.1–0.9)
Monocytes Relative: 7 %
Neutro Abs: 4.9 10*3/uL (ref 1.5–6.5)
Neutrophils Relative %: 76 %
PLATELETS: 405 10*3/uL — AB (ref 145–400)
RBC: 4.54 MIL/uL (ref 3.70–5.45)
RDW: 12.2 % (ref 11.2–16.1)
WBC: 6.4 10*3/uL (ref 3.9–10.3)

## 2017-07-05 LAB — COMPREHENSIVE METABOLIC PANEL
ALT: 16 U/L (ref 0–55)
ANION GAP: 7 (ref 3–11)
AST: 21 U/L (ref 5–34)
Albumin: 3.8 g/dL (ref 3.5–5.0)
Alkaline Phosphatase: 78 U/L (ref 40–150)
BUN: 10 mg/dL (ref 7–26)
CO2: 29 mmol/L (ref 22–29)
CREATININE: 0.86 mg/dL (ref 0.60–1.10)
Calcium: 10.1 mg/dL (ref 8.4–10.4)
Chloride: 99 mmol/L (ref 98–109)
GFR calc non Af Amer: >60 mL/min (ref >60)
Glucose, Bld: 85 mg/dL (ref 70–140)
Potassium: 4.9 mmol/L — ABNORMAL HIGH (ref 3.3–4.7)
SODIUM: 135 mmol/L — AB (ref 136–145)
Total Bilirubin: 0.6 mg/dL (ref 0.2–1.2)
Total Protein: 6.8 g/dL (ref 6.4–8.3)

## 2017-07-05 MED ORDER — EVEROLIMUS 5 MG PO TABS: 5.0000 mg | ORAL_TABLET | Freq: Every day | ORAL | 12 refills | Status: DC

## 2017-07-05 MED ORDER — EXEMESTANE 25 MG PO TABS: 25.0000 mg | ORAL_TABLET | Freq: Every day | ORAL | 4 refills | Status: DC

## 2017-07-05 NOTE — Telephone Encounter (Signed)
Gave patient avs and calendar with appts per 1/23 los.

## 2017-07-05 NOTE — Telephone Encounter (Signed)
Oral Oncology Patient Advocate Encounter  Received notification from [place] that prior authorization for Afinitor is required.  PA submitted on CoverMyMeds Key [id number] Status is pending  Oral Oncology Clinic will continue to follow.  Sonia Hill. [name], [place] Patient [place] [phone] 07/05/2017 2:37 PM

## 2017-07-06 ENCOUNTER — Telehealth: Payer: Self-pay | Admitting: Pharmacist

## 2017-07-06 DIAGNOSIS — C7951 Secondary malignant neoplasm of bone: Principal | ICD-10-CM

## 2017-07-06 DIAGNOSIS — C50912 Malignant neoplasm of unspecified site of left female breast: Secondary | ICD-10-CM

## 2017-07-06 LAB — CA 125: CANCER ANTIGEN (CA) 125: 188.2 U/mL — AB (ref 0.0–38.1)

## 2017-07-06 LAB — CEA: CEA: 130.6 ng/mL — ABNORMAL HIGH (ref 0.0–4.7)

## 2017-07-06 MED ORDER — EVEROLIMUS 5 MG PO TABS: 5.0000 mg | ORAL_TABLET | Freq: Every day | ORAL | 12 refills | Status: DC

## 2017-07-06 NOTE — Telephone Encounter (Signed)
Oral Oncology Pharmacist Encounter  Received new prescription for Afinitor (everolimus) for the treatment of hormone receptor positive, metastatic, lobular breast cancer in conjunction with exemestane, planned duration until disease progression or unacceptable toxicity.  Labs from 07/05/17 assessed, OK for treatment. Pt with wt=43.9kg, est CrCl~45 mL/min No dose adjustment needed for renal dysfunction per manufacturer. Noted dose adjustment to 5mg  daily for increased toleration  Current medication list in Epic reviewed, no DDIs with Afinitor identified.  Prescription has been e-scribed to [facility] for benefits analysis and approval per insurance requirement.  Oral Oncology Clinic will continue to follow for insurance authorization, copayment issues, initial counseling and start date.  Johny Drilling, PharmD, BCPS, BCOP 07/06/2017 9:42 AM Oral Oncology Clinic [phone]

## 2017-07-06 NOTE — Telephone Encounter (Signed)
Oral Oncology Patient Advocate Encounter  Prior Authorization for Afinitor has been approved.    PA# [id number] Effective dates: 07/05/2017 through 07/05/2018  Oral Oncology Clinic will continue to follow.   [name]. [name], [name] Patient [place] [phone] 07/06/2017 8:57 AM

## 2017-07-06 NOTE — Telephone Encounter (Signed)
Oral Chemotherapy Pharmacist Encounter   I spoke with patient for overview of: Afinitory.   Pt is doing well. Counseled patient on administration, dosing, side effects, monitoring, drug-food interactions, safe handling, storage, and disposal.  Patient will take Afinitor 5mg  tablets, 1 tablet by mouth once daily, with water, without regard to food. Patient understands to take Afinitor consistently with regards to food and at approximately the same time each day. Patient knows to aviod grapefruit or grapefruit juice while on therapy with Afinitor.  Patient will take exemestane 25mg  tablets, 1 tablet by mouth once daily after a meal. Patient plans to take her Afinitor and exemestane with breakfast daily.  Afinitor start date: pending medication acquisition, likely week of 07/05/17 Exemestane start date: 07/06/17  Side effects include but not limited to: mouth sores, GI upset, rash, increased blood sugars, decreased blood counts, and edema.    Reviewed with patient importance of keeping a medication schedule and plan for any missed doses.  Sonia Hill voiced understanding and appreciation.   All questions answered. Medication reconciliation performed and medication/allergy list updated.  Patient updated about [facility] (ph: [phone]) and specialty pharmacy process. Patient remembers this process from [place] prescription processing.  Patient will call the office to alert [place] when she has started her Afinitor.   Patient knows to call the office with questions or concerns. Oral Oncology Clinic will continue to follow.  Thank you,  Johny Drilling, PharmD, BCPS, BCOP 07/06/2017 10:17 AM Oral Oncology Clinic [phone]

## 2017-07-10 ENCOUNTER — Encounter (HOSPITAL_COMMUNITY): Payer: Self-pay

## 2017-07-10 ENCOUNTER — Telehealth: Payer: Self-pay | Admitting: *Deleted

## 2017-07-10 ENCOUNTER — Inpatient Hospital Stay (HOSPITAL_COMMUNITY): Payer: 59

## 2017-07-10 ENCOUNTER — Other Ambulatory Visit: Payer: Self-pay

## 2017-07-10 ENCOUNTER — Ambulatory Visit (HOSPITAL_COMMUNITY)
Admission: RE | Admit: 2017-07-10 | Discharge: 2017-07-10 | Disposition: A | Discharge status: Home / Self Care | Payer: 59 | Source: Ambulatory Visit | Attending: Oncology | Admitting: Oncology

## 2017-07-10 ENCOUNTER — Emergency Department (HOSPITAL_COMMUNITY): Payer: 59

## 2017-07-10 ENCOUNTER — Other Ambulatory Visit: Payer: Self-pay | Admitting: *Deleted

## 2017-07-10 ENCOUNTER — Inpatient Hospital Stay (HOSPITAL_COMMUNITY)
Admission: EM | Admit: 2017-07-10 | Discharge: 2017-07-14 | DRG: 388 | Disposition: A | Discharge status: Home / Self Care | Payer: 59 | Attending: Family Medicine | Admitting: Family Medicine

## 2017-07-10 DIAGNOSIS — C7951 Secondary malignant neoplasm of bone: Secondary | ICD-10-CM | POA: Diagnosis present

## 2017-07-10 DIAGNOSIS — Z90722 Acquired absence of ovaries, bilateral: Secondary | ICD-10-CM | POA: Diagnosis not present

## 2017-07-10 DIAGNOSIS — C50919 Malignant neoplasm of unspecified site of unspecified female breast: Secondary | ICD-10-CM | POA: Diagnosis present

## 2017-07-10 DIAGNOSIS — Z9012 Acquired absence of left breast and nipple: Secondary | ICD-10-CM

## 2017-07-10 DIAGNOSIS — Z882 Allergy status to sulfonamides status: Secondary | ICD-10-CM | POA: Diagnosis not present

## 2017-07-10 DIAGNOSIS — R978 Other abnormal tumor markers: Secondary | ICD-10-CM | POA: Diagnosis not present

## 2017-07-10 DIAGNOSIS — Z0189 Encounter for other specified special examinations: Secondary | ICD-10-CM | POA: Diagnosis not present

## 2017-07-10 DIAGNOSIS — Z933 Colostomy status: Secondary | ICD-10-CM

## 2017-07-10 DIAGNOSIS — K56600 Partial intestinal obstruction, unspecified as to cause: Principal | ICD-10-CM | POA: Diagnosis present

## 2017-07-10 DIAGNOSIS — Z170 Estrogen receptor positive status [ER+]: Secondary | ICD-10-CM | POA: Diagnosis not present

## 2017-07-10 DIAGNOSIS — Z9049 Acquired absence of other specified parts of digestive tract: Secondary | ICD-10-CM | POA: Diagnosis not present

## 2017-07-10 DIAGNOSIS — M810 Age-related osteoporosis without current pathological fracture: Secondary | ICD-10-CM | POA: Diagnosis present

## 2017-07-10 DIAGNOSIS — C50312 Malignant neoplasm of lower-inner quadrant of left female breast: Secondary | ICD-10-CM | POA: Diagnosis not present

## 2017-07-10 DIAGNOSIS — K56609 Unspecified intestinal obstruction, unspecified as to partial versus complete obstruction: Secondary | ICD-10-CM

## 2017-07-10 DIAGNOSIS — Z905 Acquired absence of kidney: Secondary | ICD-10-CM

## 2017-07-10 DIAGNOSIS — Z85048 Personal history of other malignant neoplasm of rectum, rectosigmoid junction, and anus: Secondary | ICD-10-CM

## 2017-07-10 DIAGNOSIS — N189 Chronic kidney disease, unspecified: Secondary | ICD-10-CM | POA: Diagnosis present

## 2017-07-10 DIAGNOSIS — E871 Hypo-osmolality and hyponatremia: Secondary | ICD-10-CM | POA: Diagnosis not present

## 2017-07-10 DIAGNOSIS — Z79899 Other long term (current) drug therapy: Secondary | ICD-10-CM

## 2017-07-10 DIAGNOSIS — K589 Irritable bowel syndrome without diarrhea: Secondary | ICD-10-CM | POA: Diagnosis present

## 2017-07-10 DIAGNOSIS — Z91048 Other nonmedicinal substance allergy status: Secondary | ICD-10-CM

## 2017-07-10 DIAGNOSIS — E43 Unspecified severe protein-calorie malnutrition: Secondary | ICD-10-CM | POA: Diagnosis not present

## 2017-07-10 DIAGNOSIS — E860 Dehydration: Secondary | ICD-10-CM | POA: Diagnosis present

## 2017-07-10 DIAGNOSIS — Z883 Allergy status to other anti-infective agents status: Secondary | ICD-10-CM | POA: Diagnosis not present

## 2017-07-10 DIAGNOSIS — C785 Secondary malignant neoplasm of large intestine and rectum: Secondary | ICD-10-CM | POA: Diagnosis not present

## 2017-07-10 DIAGNOSIS — Z9189 Other specified personal risk factors, not elsewhere classified: Secondary | ICD-10-CM

## 2017-07-10 DIAGNOSIS — E039 Hypothyroidism, unspecified: Secondary | ICD-10-CM | POA: Diagnosis present

## 2017-07-10 DIAGNOSIS — C50912 Malignant neoplasm of unspecified site of left female breast: Secondary | ICD-10-CM | POA: Diagnosis not present

## 2017-07-10 DIAGNOSIS — C799 Secondary malignant neoplasm of unspecified site: Secondary | ICD-10-CM

## 2017-07-10 DIAGNOSIS — E162 Hypoglycemia, unspecified: Secondary | ICD-10-CM | POA: Diagnosis not present

## 2017-07-10 DIAGNOSIS — E876 Hypokalemia: Secondary | ICD-10-CM | POA: Diagnosis not present

## 2017-07-10 DIAGNOSIS — N1330 Unspecified hydronephrosis: Secondary | ICD-10-CM | POA: Diagnosis present

## 2017-07-10 DIAGNOSIS — Z681 Body mass index (BMI) 19 or less, adult: Secondary | ICD-10-CM

## 2017-07-10 DIAGNOSIS — Z90710 Acquired absence of both cervix and uterus: Secondary | ICD-10-CM | POA: Diagnosis not present

## 2017-07-10 LAB — COMPREHENSIVE METABOLIC PANEL
ALK PHOS: 70 U/L (ref 38–126)
ALT: 15 U/L (ref 14–54)
AST: 26 U/L (ref 15–41)
Albumin: 3.7 g/dL (ref 3.5–5.0)
Anion gap: 9 (ref 5–15)
BUN: 9 mg/dL (ref 6–20)
CALCIUM: 9.4 mg/dL (ref 8.9–10.3)
CHLORIDE: 89 mmol/L — AB (ref 101–111)
CO2: 27 mmol/L (ref 22–32)
CREATININE: 0.68 mg/dL (ref 0.44–1.00)
Glucose, Bld: 87 mg/dL (ref 65–99)
Potassium: 3.7 mmol/L (ref 3.5–5.1)
Sodium: 125 mmol/L — ABNORMAL LOW (ref 135–145)
Total Bilirubin: 0.5 mg/dL (ref 0.3–1.2)
Total Protein: 6.3 g/dL — ABNORMAL LOW (ref 6.5–8.1)

## 2017-07-10 LAB — LIPASE, BLOOD: LIPASE: 45 U/L (ref 11–51)

## 2017-07-10 LAB — CBC WITH DIFFERENTIAL/PLATELET
BASOS ABS: 0 10*3/uL (ref 0.0–0.1)
Basophils Relative: 0 %
Eosinophils Absolute: 0 10*3/uL (ref 0.0–0.7)
Eosinophils Relative: 1 %
HCT: 34.5 % — ABNORMAL LOW (ref 36.0–46.0)
HEMOGLOBIN: 12.5 g/dL (ref 12.0–15.0)
LYMPHS ABS: 1 10*3/uL (ref 0.7–4.0)
LYMPHS PCT: 12 %
MCH: 30.9 pg (ref 26.0–34.0)
MCHC: 36.2 g/dL — ABNORMAL HIGH (ref 30.0–36.0)
MCV: 85.2 fL (ref 78.0–100.0)
Monocytes Absolute: 0.7 10*3/uL (ref 0.1–1.0)
Monocytes Relative: 9 %
NEUTROS ABS: 6.8 10*3/uL (ref 1.7–7.7)
NEUTROS PCT: 78 %
Platelets: 391 10*3/uL (ref 150–400)
RBC: 4.05 MIL/uL (ref 3.87–5.11)
RDW: 12.1 % (ref 11.5–15.5)
WBC: 8.6 10*3/uL (ref 4.0–10.5)

## 2017-07-10 MED ORDER — LORAZEPAM 2 MG/ML IJ SOLN
0.5000 mg | Freq: Once | INTRAMUSCULAR | Status: AC
  Administered 2017-07-10: 0.5 mg via INTRAVENOUS
  Filled 2017-07-10: qty 1

## 2017-07-10 MED ORDER — SODIUM CHLORIDE 0.9 % IV SOLN
INTRAVENOUS | Status: DC
  Administered 2017-07-10 – 2017-07-11 (×3): via INTRAVENOUS

## 2017-07-10 MED ORDER — MORPHINE SULFATE (PF) 2 MG/ML IV SOLN: 1.0000 mg | INTRAVENOUS | Status: DC | PRN

## 2017-07-10 MED ORDER — ONDANSETRON HCL 4 MG/2ML IJ SOLN: 4.0000 mg | Freq: Four times a day (QID) | INTRAMUSCULAR | Status: DC | PRN

## 2017-07-10 MED ORDER — POTASSIUM CHLORIDE 10 MEQ/100ML IV SOLN
10.0000 meq | INTRAVENOUS | Status: AC
  Administered 2017-07-10 (×4): 10 meq via INTRAVENOUS
  Filled 2017-07-10 (×4): qty 100

## 2017-07-10 MED ORDER — IOPAMIDOL (ISOVUE-300) INJECTION 61%
30.0000 mL | Freq: Once | INTRAVENOUS | Status: AC | PRN
  Administered 2017-07-10: 30 mL via ORAL

## 2017-07-10 MED ORDER — ONDANSETRON HCL 4 MG/2ML IJ SOLN
4.0000 mg | Freq: Once | INTRAMUSCULAR | Status: AC
  Administered 2017-07-10: 4 mg via INTRAVENOUS
  Filled 2017-07-10: qty 2

## 2017-07-10 MED ORDER — IOPAMIDOL (ISOVUE-300) INJECTION 61%
INTRAVENOUS | Status: AC
  Filled 2017-07-10: qty 100

## 2017-07-10 MED ORDER — IOPAMIDOL (ISOVUE-300) INJECTION 61%
100.0000 mL | Freq: Once | INTRAVENOUS | Status: AC | PRN
  Administered 2017-07-10: 80 mL via INTRAVENOUS

## 2017-07-10 MED ORDER — IOPAMIDOL (ISOVUE-300) INJECTION 61%
INTRAVENOUS | Status: AC
  Administered 2017-07-10: 30 mL via ORAL
  Filled 2017-07-10: qty 30

## 2017-07-10 MED ORDER — MAGNESIUM SULFATE 2 GM/50ML IV SOLN
2.0000 g | Freq: Once | INTRAVENOUS | Status: AC
  Administered 2017-07-10: 2 g via INTRAVENOUS
  Filled 2017-07-10: qty 50

## 2017-07-10 MED ORDER — DIATRIZOATE MEGLUMINE & SODIUM 66-10 % PO SOLN
90.0000 mL | Freq: Once | ORAL | Status: AC
  Administered 2017-07-10: 90 mL via NASOGASTRIC
  Filled 2017-07-10: qty 90

## 2017-07-10 MED ORDER — SODIUM CHLORIDE 0.9 % IV BOLUS (SEPSIS)
1000.0000 mL | Freq: Once | INTRAVENOUS | Status: AC
  Administered 2017-07-10: 1000 mL via INTRAVENOUS

## 2017-07-10 MED ORDER — PANTOPRAZOLE SODIUM 40 MG IV SOLR
40.0000 mg | INTRAVENOUS | Status: DC
  Administered 2017-07-11 – 2017-07-13 (×3): 40 mg via INTRAVENOUS
  Filled 2017-07-10 (×3): qty 40

## 2017-07-10 NOTE — Telephone Encounter (Addendum)
This RN spoke with pt per her VM stating increased bowel spasms with nausea over the weekend.  Sonia Hill states she contacted Dr Perley Jain for work in visit - " but they do not have an opening until Thursday "  Sonia Hill states she feels " bloated and swollen with spasms and then finally feel a whoosh " " can see my stomach moving "  She also gets nausea with above occurrence.  She is presently on a liquid diet only.  Sonia Hill is using gax x bid and xanax - baclofen is non beneficial.  Per discussion - Sonia Hill will come in for KUB for evaluation due to increased pain.  She will see this RN for review and further recommendations.

## 2017-07-10 NOTE — ED Notes (Signed)
Attempted to call report with no answer

## 2017-07-10 NOTE — ED Provider Notes (Signed)
[facility] DEPT Provider Note   CSN: [id number] Arrival date & time: 07/10/17  1107     History   Chief Complaint Chief Complaint  Patient presents with  . Abdominal Pain    HPI Sonia Hill is a 62 y.o. female.  HPI   62 year old female with a history of metastatic breast cancer, hyperlipidemia, CKD, depression, colostomy in place (placed November 2018 in [place] with [facility] Dr. Joya Gaskins history of SBO, presents with concern for abdominal pain and nausea.  Patient reports she had a small bowel obstruction, and had a study done last Wednesday which showed improvement.  Reports that since that time, she has had again increasing symptoms of abdominal pain with nausea.  Reports she had an episode of vomiting on Friday, but since that time has not been vomiting.  Reports that she has variable output from her ostomy, however output has been low, and today it is just a very small amount of liquid.  Reports she did pass flatus through her ostomy earlier this morning, but has not this afternoon.  Reports is a cramping and sharp abdominal pain that is located around the ostomy and becomes more generalized.  Denies fevers, urinary symptoms.  She had an x-ray done with radiology which showed concern for ileus versus small bowel obstruction and was sent to the emergency department.  Past Medical History:  Diagnosis Date  . Arthritis    neck, upper back, shoulders - no meds - yoga  . Chronic kidney disease    Only has right kidney  . Depression    Hx - no current problem  . GERD (gastroesophageal reflux disease)    diet controlled - no meds  . Hyperlipidemia    diet controlled  . Hypothyroidism   . lt breast ca dx'd 11/2009  . Osteoporosis, unspecified 12/06/2013  . PONV (postoperative nausea and vomiting)   . SVD (spontaneous vaginal delivery)    x 1    Patient Active Problem List   Diagnosis Date Noted  . SBO (small bowel  obstruction) ([place]) 06/22/2017  . Protein-calorie malnutrition ([place]) 06/22/2017  . Breast cancer metastasized to multiple sites ([place]) 10/16/2015  . Genetic testing 06/24/2015  . Osteoporosis 12/06/2013  . Cancer of lower-inner quadrant of left female breast ([place]) 03/22/2013  . Breast cancer metastasized to bone [facility]) 10/03/2012    Past Surgical History:  Procedure Laterality Date  . BLADDER NECK RECONSTRUCTION     made a new urethea tube  . bladder tact  2008  . BREAST SURGERY     masectomy left breast  . coloscopy    . COLOSTOMY    . DILATION AND CURETTAGE OF UTERUS    . LAPAROSCOPY N/A 01/24/2013   Procedure: LAPROSCOPY OPERATIVE;  Surgeon: Margarette Asal, MD;  Location: [facility];  Service: Gynecology;  Laterality: N/A;  . left kidney removed     left kidney dysfunction  . SALPINGOOPHORECTOMY Bilateral 01/24/2013   Procedure: SALPINGO OOPHORECTOMY;  Surgeon: Margarette Asal, MD;  Location: [facility];  Service: Gynecology;  Laterality: Bilateral;  . WISDOM TOOTH EXTRACTION      OB History    No data available       Home Medications    Prior to Admission medications   Medication Sig Start Date End Date Taking? Authorizing Provider  ALPRAZolam (XANAX) 0.25 MG tablet Take 0.25 mg by mouth every 8 (eight) hours as needed for anxiety or sleep.   Yes [provider]  baclofen (LIORESAL) 20 MG tablet Take 1 tablet (20 mg total) by mouth 3 (three) times daily. 06/28/17  Yes Magrinat, Virgie Dad, MD  cholecalciferol (D-VI-SOL) 400 UNIT/ML LIQD Take 400 Units by mouth daily.   Yes [provider]  exemestane (AROMASIN) 25 MG tablet Take 1 tablet (25 mg total) by mouth daily after breakfast. 07/05/17  Yes Magrinat, Virgie Dad, MD  ibuprofen (ADVIL,MOTRIN) 200 MG tablet Take 200 mg by mouth every 6 (six) hours as needed for moderate pain.   Yes [provider]  ondansetron (ZOFRAN-ODT) 8 MG disintegrating tablet Take 1 tablet (8 mg total) by mouth every 8 (eight)  hours as needed for nausea or vomiting. 06/22/17  Yes Magrinat, Virgie Dad, MD  polyethylene glycol (MIRALAX / GLYCOLAX) packet Take 17 g by mouth daily.   Yes [provider]  promethazine (PHENERGAN) 25 MG suppository Place 1 suppository (25 mg total) rectally every 6 (six) hours as needed for nausea or vomiting. 06/22/17  Yes Magrinat, Virgie Dad, MD  simethicone (MYLICON) 161 MG chewable tablet Chew 125 mg by mouth every 6 (six) hours as needed for flatulence.   Yes [provider]  Thyroid (NATURE-THROID) 81.25 MG TABS Take 81.25 mg by mouth daily.   Yes [provider]  everolimus (AFINITOR) 5 MG tablet Take 1 tablet (5 mg total) by mouth daily. Take with a full glass of water, consistently with or without food. 07/06/17   Magrinat, Virgie Dad, MD  gabapentin (NEURONTIN) 300 MG capsule Take 1 capsule (300 mg total) by mouth 3 (three) times daily as needed. Patient taking differently: Take 300 mg by mouth 3 (three) times daily as needed (nerve pain).  06/28/17   Magrinat, Virgie Dad, MD  ondansetron (ZOFRAN ODT) 4 MG disintegrating tablet Take 1 tablet (4 mg total) by mouth every 8 (eight) hours as needed for nausea or vomiting. Patient not taking: Reported on 07/10/2017 06/19/17   Magrinat, Virgie Dad, MD  traMADol (ULTRAM) 50 MG tablet Take 1 tablet (50 mg total) by mouth every 6 (six) hours as needed. 06/28/17   Magrinat, Virgie Dad, MD    Family History Family History  Problem Relation Age of Onset  . Breast cancer Mother 53  . Breast cancer Maternal Aunt 58  . Breast cancer Other        MGF's sister dx with breast cancer in her 30s    Social History Social History   Tobacco Use  . Smoking status: Never Smoker  . Smokeless tobacco: Never Used  Substance Use Topics  . Alcohol use: No  . Drug use: No     Allergies   Adhesive [tape]; Betadine [povidone iodine]; Mercury; and Sulfa antibiotics   Review of Systems Review of Systems  Constitutional: Negative for  fever.  HENT: Negative for sore throat.   Eyes: Negative for visual disturbance.  Respiratory: Negative for cough and shortness of breath.   Cardiovascular: Negative for chest pain.  Gastrointestinal: Positive for abdominal pain, constipation and nausea. Negative for vomiting (did no Friday, none today).  Genitourinary: Negative for difficulty urinating.  Musculoskeletal: Negative for back pain and neck pain.  Skin: Negative for rash.  Neurological: Negative for syncope and headaches.     Physical Exam Updated Vital Signs BP 126/81   Pulse 83   Temp 97.9 F (36.6 C) (Oral)   Resp 14   Wt 43.5 kg (96 lb)   SpO2 98%   BMI 18.14 kg/m   Physical Exam  Constitutional: She is  oriented to person, place, and time. She appears well-developed. She appears cachectic. No distress.  HENT:  Head: Normocephalic and atraumatic.  Eyes: Conjunctivae and EOM are normal.  Neck: Normal range of motion.  Cardiovascular: Normal rate, regular rhythm, normal heart sounds and intact distal pulses. Exam reveals no gallop and no friction rub.  No murmur heard. Pulmonary/Chest: Effort normal and breath sounds normal. No respiratory distress. She has no wheezes. She has no rales.  Abdominal: Soft. She exhibits distension (mild). There is tenderness (diffuse). There is no guarding.  Small amount of fluid in ostom  Musculoskeletal: She exhibits no edema or tenderness.  Neurological: She is alert and oriented to person, place, and time.  Skin: Skin is warm and dry. No rash noted. She is not diaphoretic. No erythema.  Nursing note and vitals reviewed.    ED Treatments / Results  Labs (all labs ordered are listed, but only abnormal results are displayed) Labs Reviewed  CBC WITH DIFFERENTIAL/PLATELET - Abnormal; Notable for the following components:      Result Value   HCT 34.5 (*)    MCHC 36.2 (*)    All other components within normal limits  COMPREHENSIVE METABOLIC PANEL - Abnormal; Notable for the  following components:   Sodium 125 (*)    Chloride 89 (*)    Total Protein 6.3 (*)    All other components within normal limits  LIPASE, BLOOD    EKG  EKG Interpretation None       Radiology Ct Abdomen Pelvis W Contrast  Result Date: 07/10/2017 CLINICAL DATA:  History of breast cancer and rectal cancer. Abdominal pain and bloating. EXAM: CT ABDOMEN AND PELVIS WITH CONTRAST TECHNIQUE: Multidetector CT imaging of the abdomen and pelvis was performed using the standard protocol following bolus administration of intravenous contrast. CONTRAST:  80 cc  ISOVUE-300 IOPAMIDOL (ISOVUE-300) INJECTION 61% COMPARISON:  CT scan 05/22/2017 FINDINGS: Lower chest: The lung bases are clear. The heart is normal in size. No pericardial effusion. Hepatobiliary: No focal hepatic lesions to suggest metastatic disease. The gallbladder appears normal. No common bile duct dilatation. Pancreas: No mass, inflammation or ductal dilatation. Spleen: Normal size.  No focal lesions. Adrenals/Urinary Tract: Adrenal glands are normal. The left kidney is surgically absent. The right kidney demonstrates compensatory hypertrophy and moderate right-sided hydroureteronephrosis. Stomach/Bowel: The stomach, duodenum and small bowel are dilated and demonstrates scattered air-fluid levels. There is a transition to decompressed small bowel loops in the right lower quadrant. Findings consistent with a mid distal small bowel obstruction. Contrast in the colon is likely from the previous small bowel follow-through from 07/03/2017. Stable left lower quadrant colostomy. Large infiltrating lower rectal mass which appears to involve the anus, presacral soft tissues and the uterus. Vascular/Lymphatic: The major vascular structures are patent. Small scattered mesenteric and retroperitoneal lymph nodes are stable. Reproductive: The uterus appears to be involved with rectal tumor. Other: There is a curvilinear radiodensity in the right pelvis which was  not present on the prior study. It may be a fractured catheter tip or prior ureteral stent. Recommend correlation with clinical history. Musculoskeletal: No significant bony findings. IMPRESSION: 1. Large fungating/infiltrating rectal mass, relatively stable. 2. Mid distal small bowel obstruction. 3. Right-sided hydroureteronephrosis relatively stable when compared to prior study. 4. Curvilinear radiodensity in the right pelvis possibly a fractured/retained catheter tip. Electronically Signed   By: Marijo Sanes M.D.   On: 07/10/2017 16:21   Dg Abd 2 Views  Result Date: 07/10/2017 CLINICAL DATA:  Bloating. Abdominal pain  and nausea. Colostomy in November. Metastatic breast cancer. EXAM: ABDOMEN - 2 VIEW COMPARISON:  07/03/2017 small bowel follow-through. FINDINGS: Upright and supine views. The upright view demonstrates no free intraperitoneal air. Surgical clips about the left lower chest. Air-fluid levels which could be within large or small bowel loops. Supine image demonstrates gaseous distension of small bowel including at 5.1 cm. IMPRESSION: Development of moderate small bowel dilatation with bowel air-fluid levels. Suspicious for distal small bowel or colonic obstruction. Adynamic ileus is felt less likely. No free intraperitoneal air or other acute complication. Electronically Signed   By: Abigail Miyamoto M.D.   On: 07/10/2017 10:51    Procedures Procedures (including critical care time)  Medications Ordered in ED Medications  0.9 %  sodium chloride infusion ( Intravenous New Bag/Given 07/10/17 1711)  diatrizoate meglumine-sodium (GASTROGRAFIN) 66-10 % solution 90 mL (not administered)  magnesium sulfate IVPB 2 g 50 mL (not administered)  potassium chloride 10 mEq in 100 mL IVPB (not administered)  sodium chloride 0.9 % bolus 1,000 mL (0 mLs Intravenous Stopped 07/10/17 1636)  ondansetron (ZOFRAN) injection 4 mg (4 mg Intravenous Given 07/10/17 1332)  LORazepam (ATIVAN) injection 0.5 mg (0.5 mg  Intravenous Given 07/10/17 1332)  iopamidol (ISOVUE-300) 61 % injection 30 mL (30 mLs Oral Contrast Given 07/10/17 1355)  iopamidol (ISOVUE-300) 61 % injection 100 mL (80 mLs Intravenous Contrast Given 07/10/17 1525)     Initial Impression / Assessment and Plan / ED Course  I have reviewed the triage vital signs and the nursing notes.  Pertinent labs & imaging results that were available during my care of the patient were reviewed by me and considered in my medical decision making (see chart for details).     62 year old female with a history of metastatic breast cancer, hyperlipidemia, CKD, depression, colostomy in place with history of SBO, presents with concern for abdominal pain and nausea.  Labs show significant worsening of her hyponatremia,, from 135-125 in 5 days, likely due to dehydration, decreased intake.  CT shows mid SBO.  Patient reports significant improvement in the ED, notes no vomiting, improved discomfort, flatus from ostomy in the ED and increased output since my initial evaluation (although overall minimal). Initially discussed with Dr. Donne Hazel and given improvement, I feel notification of surgery and hospital admission to keep her NPO is appropriate.  Dr. Erlinda Hong, hospitalist to admit patient, discussed with Dr. Donne Hazel and plan on placing NG tube.    Dr. Oletta Lamas was GI physician but now she is to see Dr. Watt Climes as her GI physician.     Final Clinical Impressions(s) / ED Diagnoses   Final diagnoses:  SBO (small bowel obstruction) [facility])    ED Discharge Orders    None       Gareth Morgan, MD 07/10/17 1755

## 2017-07-10 NOTE — ED Triage Notes (Signed)
Patient with history of left breast cancer with left mastectomy and rectal cancer, presents today with abdominal pain and bloating. Patient reports having been recently diagnosed with a small bowel obstruction, which was diagnosed as having self-cleared via small bowel follow through study on Monday 07/03/17. Patient reports "starting to eat normal food" following her positive diagnosis, but reports "the pain and and bloating started on Friday 07/07/17." Patient has left abdominal ostomy and states her last bowel movement was "oozy and gassy" on Friday. Patient reports audible bowel sounds, but denies any bowel movements/passing gas. Patient denies pain at this time, but states "it comes and goes." Patient was seen at radiology today and was diagnosed with a small bowel obstruction and was sent to [facility] for further evaluation and treatment.

## 2017-07-10 NOTE — H&P (Signed)
History and Physical  Sonia Hill QMG:[id number] DOB: 1955-11-04 DOA: 07/10/2017  Referring physician: EDP PCP: [name], MD   Chief Complaint: Abdominal pain, nausea, vomiting  HPI: Sonia Hill is a 62 y.o. female   With metastatic breast cancer, status post colostomy due to rectal mass presented to [facility] emergency room due to above complaints.  Patient reports she had small bowel obstruction 3 weeks ago, she was treated at  [facility] in [place].  She had a follow-up small bowel x-ray on January 21 which was unremarkable.  She reports she felt good when she has her follow-up appointment with Dr. [name] on the 23rd.  However upon returning home after the follow-up appointment, she started to have nausea and vomiting.  Her symptoms has progressed, she started to have abdominal spasm.  She has not eat anything since Friday.  she did not have any colostomy output in the last 24 hours.  She feels dehydrated.  Denies fever.  ED course: Vital signs are stable, Basic lab work CBC unremarkable, sodium 125, potassium 3.7, creatinine 0.68.  CT ab/pel showed "The stomach, duodenum and small bowel are dilated and demonstrates scattered air-fluid levels. There is a transition to decompressed small bowel loops in the right lower quadrant. Findings consistent with a mid distal small bowel obstruction"  She is given IV fluids, IV Ativan and Zofran, patient reports feeling better.  Hospitalist called to admit the patient.   Review of Systems:  Detail per HPI, Review of systems are otherwise negative  Past Medical History:  Diagnosis Date  . Arthritis    neck, upper back, shoulders - no meds - yoga  . Chronic kidney disease    Only has right kidney  . Depression    Hx - no current problem  . GERD (gastroesophageal reflux disease)    diet controlled - no meds  . Hyperlipidemia    diet controlled  . Hypothyroidism   . lt breast ca dx'd 11/2009  . Osteoporosis,  unspecified 12/06/2013  . PONV (postoperative nausea and vomiting)   . SVD (spontaneous vaginal delivery)    x 1   Past Surgical History:  Procedure Laterality Date  . BLADDER NECK RECONSTRUCTION     made a new urethea tube  . bladder tact  2008  . BREAST SURGERY     masectomy left breast  . coloscopy    . COLOSTOMY    . DILATION AND CURETTAGE OF UTERUS    . LAPAROSCOPY N/A 01/24/2013   Procedure: LAPROSCOPY OPERATIVE;  Surgeon: Margarette Asal, MD;  Location: [facility];  Service: Gynecology;  Laterality: N/A;  . left kidney removed     left kidney dysfunction  . SALPINGOOPHORECTOMY Bilateral 01/24/2013   Procedure: SALPINGO OOPHORECTOMY;  Surgeon: Margarette Asal, MD;  Location: [facility];  Service: Gynecology;  Laterality: Bilateral;  . WISDOM TOOTH EXTRACTION     Social History:  reports that  has never smoked. she has never used smokeless tobacco. She reports that she does not drink alcohol or use drugs. Patient lives at home & is able to participate in activities of daily living independently   Allergies  Allergen Reactions  . Adhesive [Tape] Rash    Ok to use paper tape and tegaderm over IV site  . Betadine [Povidone Iodine] Rash  . Mercury Rash    Reaction mercurachrome  . Sulfa Antibiotics Rash    Family History  Problem Relation Age of Onset  . Breast cancer Mother  75  . Breast cancer Maternal Aunt 58  . Breast cancer Other        MGF's sister dx with breast cancer in her 22s      Prior to Admission medications   Medication Sig Start Date End Date Taking? Authorizing Provider  ALPRAZolam (XANAX) 0.25 MG tablet Take 0.25 mg by mouth every 8 (eight) hours as needed for anxiety or sleep.   Yes [provider]  baclofen (LIORESAL) 20 MG tablet Take 1 tablet (20 mg total) by mouth 3 (three) times daily. 06/28/17  Yes Magrinat, Virgie Dad, MD  cholecalciferol (D-VI-SOL) 400 UNIT/ML LIQD Take 400 Units by mouth daily.   Yes [provider]  exemestane  (AROMASIN) 25 MG tablet Take 1 tablet (25 mg total) by mouth daily after breakfast. 07/05/17  Yes Magrinat, Virgie Dad, MD  ibuprofen (ADVIL,MOTRIN) 200 MG tablet Take 200 mg by mouth every 6 (six) hours as needed for moderate pain.   Yes [provider]  ondansetron (ZOFRAN-ODT) 8 MG disintegrating tablet Take 1 tablet (8 mg total) by mouth every 8 (eight) hours as needed for nausea or vomiting. 06/22/17  Yes Magrinat, Virgie Dad, MD  polyethylene glycol (MIRALAX / GLYCOLAX) packet Take 17 g by mouth daily.   Yes [provider]  promethazine (PHENERGAN) 25 MG suppository Place 1 suppository (25 mg total) rectally every 6 (six) hours as needed for nausea or vomiting. 06/22/17  Yes Magrinat, Virgie Dad, MD  simethicone (MYLICON) 696 MG chewable tablet Chew 125 mg by mouth every 6 (six) hours as needed for flatulence.   Yes [provider]  Thyroid (NATURE-THROID) 81.25 MG TABS Take 81.25 mg by mouth daily.   Yes [provider]  everolimus (AFINITOR) 5 MG tablet Take 1 tablet (5 mg total) by mouth daily. Take with a full glass of water, consistently with or without food. 07/06/17   Magrinat, Virgie Dad, MD  gabapentin (NEURONTIN) 300 MG capsule Take 1 capsule (300 mg total) by mouth 3 (three) times daily as needed. Patient taking differently: Take 300 mg by mouth 3 (three) times daily as needed (nerve pain).  06/28/17   Magrinat, Virgie Dad, MD  ondansetron (ZOFRAN ODT) 4 MG disintegrating tablet Take 1 tablet (4 mg total) by mouth every 8 (eight) hours as needed for nausea or vomiting. Patient not taking: Reported on 07/10/2017 06/19/17   Magrinat, Virgie Dad, MD  traMADol (ULTRAM) 50 MG tablet Take 1 tablet (50 mg total) by mouth every 6 (six) hours as needed. 06/28/17   Magrinat, Virgie Dad, MD    Physical Exam: BP 126/81   Pulse 83   Temp 97.9 F (36.6 C) (Oral)   Resp 14   Wt 43.5 kg (96 lb)   SpO2 98%   BMI 18.14 kg/m   General:  Thin, NAD Eyes: PERRL ENT:  unremarkable Neck: supple, no JVD Cardiovascular: RRR Respiratory: CTABL Abdomen: distended, mild diffuse tender, positive bowel sounds on right side abdomen,  + colostomy with no output. No gas. Skin: no rash Musculoskeletal:  No edema Psychiatric: calm/cooperative Neurologic: no focal findings            Labs on Admission:  Basic Metabolic Panel: Recent Labs  Lab 07/05/17 1250 07/10/17 1314  NA 135* 125*  K 4.9* 3.7  CL 99 89*  CO2 29 27  GLUCOSE 85 87  BUN 10 9  CREATININE 0.86 0.68  CALCIUM 10.1 9.4   Liver Function Tests: Recent Labs  Lab 07/05/17 1250 07/10/17  1314  AST 21 26  ALT 16 15  ALKPHOS 78 70  BILITOT 0.6 0.5  PROT 6.8 6.3*  ALBUMIN 3.8 3.7   Recent Labs  Lab 07/10/17 1314  LIPASE 45   No results for input(s): AMMONIA in the last 168 hours. CBC: Recent Labs  Lab 07/05/17 1250 07/10/17 1314  WBC 6.4 8.6  NEUTROABS 4.9 6.8  HGB 13.8 12.5  HCT 40.9 34.5*  MCV 90.1 85.2  PLT 405* 391   Cardiac Enzymes: No results for input(s): CKTOTAL, CKMB, CKMBINDEX, TROPONINI in the last 168 hours.  BNP (last 3 results) No results for input(s): BNP in the last 8760 hours.  ProBNP (last 3 results) No results for input(s): PROBNP in the last 8760 hours.  CBG: No results for input(s): GLUCAP in the last 168 hours.  Radiological Exams on Admission: Ct Abdomen Pelvis W Contrast  Result Date: 07/10/2017 CLINICAL DATA:  History of breast cancer and rectal cancer. Abdominal pain and bloating. EXAM: CT ABDOMEN AND PELVIS WITH CONTRAST TECHNIQUE: Multidetector CT imaging of the abdomen and pelvis was performed using the standard protocol following bolus administration of intravenous contrast. CONTRAST:  80 cc  ISOVUE-300 IOPAMIDOL (ISOVUE-300) INJECTION 61% COMPARISON:  CT scan 05/22/2017 FINDINGS: Lower chest: The lung bases are clear. The heart is normal in size. No pericardial effusion. Hepatobiliary: No focal hepatic lesions to suggest metastatic  disease. The gallbladder appears normal. No common bile duct dilatation. Pancreas: No mass, inflammation or ductal dilatation. Spleen: Normal size.  No focal lesions. Adrenals/Urinary Tract: Adrenal glands are normal. The left kidney is surgically absent. The right kidney demonstrates compensatory hypertrophy and moderate right-sided hydroureteronephrosis. Stomach/Bowel: The stomach, duodenum and small bowel are dilated and demonstrates scattered air-fluid levels. There is a transition to decompressed small bowel loops in the right lower quadrant. Findings consistent with a mid distal small bowel obstruction. Contrast in the colon is likely from the previous small bowel follow-through from 07/03/2017. Stable left lower quadrant colostomy. Large infiltrating lower rectal mass which appears to involve the anus, presacral soft tissues and the uterus. Vascular/Lymphatic: The major vascular structures are patent. Small scattered mesenteric and retroperitoneal lymph nodes are stable. Reproductive: The uterus appears to be involved with rectal tumor. Other: There is a curvilinear radiodensity in the right pelvis which was not present on the prior study. It may be a fractured catheter tip or prior ureteral stent. Recommend correlation with clinical history. Musculoskeletal: No significant bony findings. IMPRESSION: 1. Large fungating/infiltrating rectal mass, relatively stable. 2. Mid distal small bowel obstruction. 3. Right-sided hydroureteronephrosis relatively stable when compared to prior study. 4. Curvilinear radiodensity in the right pelvis possibly a fractured/retained catheter tip. Electronically Signed   By: Marijo Sanes M.D.   On: 07/10/2017 16:21   Dg Abd 2 Views  Result Date: 07/10/2017 CLINICAL DATA:  Bloating. Abdominal pain and nausea. Colostomy in November. Metastatic breast cancer. EXAM: ABDOMEN - 2 VIEW COMPARISON:  07/03/2017 small bowel follow-through. FINDINGS: Upright and supine views. The upright  view demonstrates no free intraperitoneal air. Surgical clips about the left lower chest. Air-fluid levels which could be within large or small bowel loops. Supine image demonstrates gaseous distension of small bowel including at 5.1 cm. IMPRESSION: Development of moderate small bowel dilatation with bowel air-fluid levels. Suspicious for distal small bowel or colonic obstruction. Adynamic ileus is felt less likely. No free intraperitoneal air or other acute complication. Electronically Signed   By: Abigail Miyamoto M.D.   On: 07/10/2017 10:51  Assessment/Plan Present on Admission: **None**  Recurrent sbo: -Keep npo, continue hydration, prn antiemetics, analgesic, start iv ppi,  -keep K>4, mag>2 -Case discussed with General surgery Dr Donne Hazel who advised to start small bowel protocol. general surgery will see patient in am unless patient developes urgent surgical needs overnight.  Hyponatremia: likely from dehydration. Continue hydration.  Metastatic breast cancer with rectal mets s/p colostomy  Right-sided hydroureteronephrosis relatively stable when compared to prior study on CT scan. She denies flank pain, cr wnl.  Curvilinear radiodensity in the right pelvis possibly a fractured/retained catheter tip. Incidental finding on CT ab/pel.   Malnutrition: Body mass index is 18.14 kg/m.   DVT prophylaxis: scd  Consultants: general surgery  Code Status: full   Family Communication:  Patient and family at bedside  Disposition Plan: admit to med surg  Time spent: 56mins  Florencia Reasons MD, PhD [facility] Pager [phone] If 7PM-7AM, please contact night-coverage at [email or web address], password [facility]

## 2017-07-10 NOTE — ED Notes (Signed)
Paged hospitalist again

## 2017-07-10 NOTE — ED Notes (Addendum)
Attempted to call back to give report, no answer. Charge RN Catalina Antigua and [employer] made aware

## 2017-07-10 NOTE — ED Notes (Signed)
Bed: WA02 Expected date:  Expected time:  Means of arrival:  Comments: Pt from [place]

## 2017-07-10 NOTE — Telephone Encounter (Signed)
Per KUB and MD review and concern for bowel blockage - recommended for pt to proceed to the ER for appropriate management.  This RN informed pt and husband - who verbalized understanding.  Copy of reading given to pt.  This RN called report to charge nurse in ER per pending arrival of patient.

## 2017-07-10 NOTE — ED Notes (Signed)
Pt and family requesting something for anxiety, pt feeling agitated due to uncomfortable feeling of NG tube

## 2017-07-10 NOTE — ED Notes (Signed)
Sent a message to [facility] coverage Baltazar Najjar

## 2017-07-10 NOTE — ED Notes (Addendum)
Called [facility]ara to give report, requested to call back due to being "in the middle of something" and that she had just gotten 2 patients.

## 2017-07-10 NOTE — ED Notes (Signed)
ED Provider at bedside. 

## 2017-07-10 NOTE — Progress Notes (Signed)
Patient gave permission to ask questions on nursing admission history in front of her husband and sister. Lucius Conn BSN, RN-BC Admissions RN 07/10/2017 4:59 PM

## 2017-07-10 NOTE — ED Notes (Signed)
ED TO INPATIENT HANDOFF REPORT  Name/Age/Gender Sonia Hill 62 y.o. female  Code Status Code Status History    This patient does not have a recorded code status. Please follow your organizational policy for patients in this situation.    Advance Directive Documentation     Most Recent Value  Type of Advance Directive  Healthcare Power of Attorney, Living will  Pre-existing out of facility DNR order (yellow form or pink MOST form)  No data  "MOST" Form in Place?  No data      Home/SNF/Other Home  Chief Complaint gi issue/sent by dr  Level of Care/Admitting Diagnosis ED Disposition    ED Disposition Condition Comment   Admit  Hospital Area: [facility] [[id number]]  Level of Care: Med-Surg [16]  Diagnosis: SBO (small bowel obstruction) [facility]) [[id number]]  Admitting Physician: Florencia Reasons [[id number]]  Attending Physician: Florencia Reasons [[id number]]  Estimated length of stay: past midnight tomorrow  Certification:: I certify this patient will need inpatient services for at least 2 midnights  PT Class (Do Not Modify): Inpatient [101]  PT Acc Code (Do Not Modify): Private [1]       Medical History Past Medical History:  Diagnosis Date  . Arthritis    neck, upper back, shoulders - no meds - yoga  . Chronic kidney disease    Only has right kidney  . Depression    Hx - no current problem  . GERD (gastroesophageal reflux disease)    diet controlled - no meds  . Hyperlipidemia    diet controlled  . Hypothyroidism   . lt breast ca dx'd 11/2009  . Osteoporosis, unspecified 12/06/2013  . PONV (postoperative nausea and vomiting)   . SVD (spontaneous vaginal delivery)    x 1    Allergies Allergies  Allergen Reactions  . Adhesive [Tape] Rash    Ok to use paper tape and tegaderm over IV site  . Betadine [Povidone Iodine] Rash  . Mercury Rash    Reaction mercurachrome  . Sulfa Antibiotics Rash    IV Location/Drains/Wounds Patient Lines/Drains/Airways Status    Active Line/Drains/Airways    Name:   Placement date:   Placement time:   Site:   Days:   Peripheral IV 07/10/17 Right Antecubital   07/10/17    1318    Antecubital   less than 1   Incision 01/24/13 Abdomen Other (Comment)   01/24/13    0716     1628   Incision 01/24/13 Perineum Other (Comment)   01/24/13    0716     1628   Incision (Closed) 10/09/15 Buttocks Right   10/09/15    1135     640   Incision - 2 Ports Abdomen 1: Umbilicus 2: Mid;Lower   01/24/13    0755     1628   Wound / Incision (Open or Dehisced)   -    1135    -             Labs/Imaging Results for orders placed or performed during the hospital encounter of 07/10/17 (from the past 48 hour(s))  CBC with Differential     Status: Abnormal   Collection Time: 07/10/17  1:14 PM  Result Value Ref Range   WBC 8.6 4.0 - 10.5 K/uL   RBC 4.05 3.87 - 5.11 MIL/uL   Hemoglobin 12.5 12.0 - 15.0 g/dL   HCT 34.5 (L) 36.0 - 46.0 %   MCV 85.2 78.0 - 100.0 fL   MCH  30.9 26.0 - 34.0 pg   MCHC 36.2 (H) 30.0 - 36.0 g/dL   RDW 12.1 11.5 - 15.5 %   Platelets 391 150 - 400 K/uL   Neutrophils Relative % 78 %   Neutro Abs 6.8 1.7 - 7.7 K/uL   Lymphocytes Relative 12 %   Lymphs Abs 1.0 0.7 - 4.0 K/uL   Monocytes Relative 9 %   Monocytes Absolute 0.7 0.1 - 1.0 K/uL   Eosinophils Relative 1 %   Eosinophils Absolute 0.0 0.0 - 0.7 K/uL   Basophils Relative 0 %   Basophils Absolute 0.0 0.0 - 0.1 K/uL  Comprehensive metabolic panel     Status: Abnormal   Collection Time: 07/10/17  1:14 PM  Result Value Ref Range   Sodium 125 (L) 135 - 145 mmol/L   Potassium 3.7 3.5 - 5.1 mmol/L   Chloride 89 (L) 101 - 111 mmol/L   CO2 27 22 - 32 mmol/L   Glucose, Bld 87 65 - 99 mg/dL   BUN 9 6 - 20 mg/dL   Creatinine, Ser 0.68 0.44 - 1.00 mg/dL   Calcium 9.4 8.9 - 10.3 mg/dL   Total Protein 6.3 (L) 6.5 - 8.1 g/dL   Albumin 3.7 3.5 - 5.0 g/dL   AST 26 15 - 41 U/L   ALT 15 14 - 54 U/L   Alkaline Phosphatase 70 38 - 126 U/L   Total Bilirubin 0.5 0.3 -  1.2 mg/dL   GFR calc non Af Amer >60 >60 mL/min   GFR calc Af Amer >60 >60 mL/min    Comment: (NOTE) The eGFR has been calculated using the CKD EPI equation. This calculation has not been validated in all clinical situations. eGFR's persistently <60 mL/min signify possible Chronic Kidney Disease.    Anion gap 9 5 - 15  Lipase, blood     Status: None   Collection Time: 07/10/17  1:14 PM  Result Value Ref Range   Lipase 45 11 - 51 U/L   Ct Abdomen Pelvis W Contrast  Result Date: 07/10/2017 CLINICAL DATA:  History of breast cancer and rectal cancer. Abdominal pain and bloating. EXAM: CT ABDOMEN AND PELVIS WITH CONTRAST TECHNIQUE: Multidetector CT imaging of the abdomen and pelvis was performed using the standard protocol following bolus administration of intravenous contrast. CONTRAST:  80 cc  ISOVUE-300 IOPAMIDOL (ISOVUE-300) INJECTION 61% COMPARISON:  CT scan 05/22/2017 FINDINGS: Lower chest: The lung bases are clear. The heart is normal in size. No pericardial effusion. Hepatobiliary: No focal hepatic lesions to suggest metastatic disease. The gallbladder appears normal. No common bile duct dilatation. Pancreas: No mass, inflammation or ductal dilatation. Spleen: Normal size.  No focal lesions. Adrenals/Urinary Tract: Adrenal glands are normal. The left kidney is surgically absent. The right kidney demonstrates compensatory hypertrophy and moderate right-sided hydroureteronephrosis. Stomach/Bowel: The stomach, duodenum and small bowel are dilated and demonstrates scattered air-fluid levels. There is a transition to decompressed small bowel loops in the right lower quadrant. Findings consistent with a mid distal small bowel obstruction. Contrast in the colon is likely from the previous small bowel follow-through from 07/03/2017. Stable left lower quadrant colostomy. Large infiltrating lower rectal mass which appears to involve the anus, presacral soft tissues and the uterus. Vascular/Lymphatic: The  major vascular structures are patent. Small scattered mesenteric and retroperitoneal lymph nodes are stable. Reproductive: The uterus appears to be involved with rectal tumor. Other: There is a curvilinear radiodensity in the right pelvis which was not present on the prior study. It  may be a fractured catheter tip or prior ureteral stent. Recommend correlation with clinical history. Musculoskeletal: No significant bony findings. IMPRESSION: 1. Large fungating/infiltrating rectal mass, relatively stable. 2. Mid distal small bowel obstruction. 3. Right-sided hydroureteronephrosis relatively stable when compared to prior study. 4. Curvilinear radiodensity in the right pelvis possibly a fractured/retained catheter tip. Electronically Signed   By: Marijo Sanes M.D.   On: 07/10/2017 16:21   Dg Abd 2 Views  Result Date: 07/10/2017 CLINICAL DATA:  Bloating. Abdominal pain and nausea. Colostomy in November. Metastatic breast cancer. EXAM: ABDOMEN - 2 VIEW COMPARISON:  07/03/2017 small bowel follow-through. FINDINGS: Upright and supine views. The upright view demonstrates no free intraperitoneal air. Surgical clips about the left lower chest. Air-fluid levels which could be within large or small bowel loops. Supine image demonstrates gaseous distension of small bowel including at 5.1 cm. IMPRESSION: Development of moderate small bowel dilatation with bowel air-fluid levels. Suspicious for distal small bowel or colonic obstruction. Adynamic ileus is felt less likely. No free intraperitoneal air or other acute complication. Electronically Signed   By: Abigail Miyamoto M.D.   On: 07/10/2017 10:51    Pending Labs Unresulted Labs (From admission, onward)   Start     Ordered   07/11/17 0500  CBC with Differential/Platelet  Tomorrow morning,   R     07/10/17 1756   07/11/17 7829  Basic metabolic panel  Tomorrow morning,   R     07/10/17 1756   07/11/17 0500  Magnesium  Tomorrow morning,   R     07/10/17 1756       Vitals/Pain Today's Vitals   07/10/17 1438 07/10/17 1557 07/10/17 1643 07/10/17 1730  BP: (!) 148/92 138/75 133/85 126/81  Pulse: 78 84 86 83  Resp: _0 Temp:      TempSrc:      SpO2: 93% 99% 97% 98%  Weight:        Isolation Precautions No active isolations  Medications Medications  0.9 %  sodium chloride infusion ( Intravenous New Bag/Given 07/10/17 1711)  diatrizoate meglumine-sodium (GASTROGRAFIN) 66-10 % solution 90 mL (not administered)  magnesium sulfate IVPB 2 g 50 mL (not administered)  potassium chloride 10 mEq in 100 mL IVPB (not administered)  ondansetron (ZOFRAN) injection 4 mg (not administered)  pantoprazole (PROTONIX) injection 40 mg (not administered)  sodium chloride 0.9 % bolus 1,000 mL (0 mLs Intravenous Stopped 07/10/17 1636)  ondansetron (ZOFRAN) injection 4 mg (4 mg Intravenous Given 07/10/17 1332)  LORazepam (ATIVAN) injection 0.5 mg (0.5 mg Intravenous Given 07/10/17 1332)  iopamidol (ISOVUE-300) 61 % injection 30 mL (30 mLs Oral Contrast Given 07/10/17 1355)  iopamidol (ISOVUE-300) 61 % injection 100 mL (80 mLs Intravenous Contrast Given 07/10/17 1525)    Mobility walks with person assist

## 2017-07-11 ENCOUNTER — Inpatient Hospital Stay (HOSPITAL_COMMUNITY): Payer: 59

## 2017-07-11 ENCOUNTER — Encounter: Payer: 59 | Admitting: Nutrition

## 2017-07-11 DIAGNOSIS — Z170 Estrogen receptor positive status [ER+]: Secondary | ICD-10-CM

## 2017-07-11 DIAGNOSIS — Z90722 Acquired absence of ovaries, bilateral: Secondary | ICD-10-CM

## 2017-07-11 DIAGNOSIS — Z933 Colostomy status: Secondary | ICD-10-CM

## 2017-07-11 DIAGNOSIS — Z905 Acquired absence of kidney: Secondary | ICD-10-CM

## 2017-07-11 DIAGNOSIS — C785 Secondary malignant neoplasm of large intestine and rectum: Secondary | ICD-10-CM

## 2017-07-11 DIAGNOSIS — M810 Age-related osteoporosis without current pathological fracture: Secondary | ICD-10-CM

## 2017-07-11 DIAGNOSIS — C50312 Malignant neoplasm of lower-inner quadrant of left female breast: Secondary | ICD-10-CM

## 2017-07-11 DIAGNOSIS — C7951 Secondary malignant neoplasm of bone: Secondary | ICD-10-CM

## 2017-07-11 DIAGNOSIS — K56609 Unspecified intestinal obstruction, unspecified as to partial versus complete obstruction: Secondary | ICD-10-CM

## 2017-07-11 DIAGNOSIS — E43 Unspecified severe protein-calorie malnutrition: Secondary | ICD-10-CM

## 2017-07-11 LAB — BASIC METABOLIC PANEL
Anion gap: 10 (ref 5–15)
BUN: 8 mg/dL (ref 6–20)
CALCIUM: 9.1 mg/dL (ref 8.9–10.3)
CHLORIDE: 100 mmol/L — AB (ref 101–111)
CO2: 22 mmol/L (ref 22–32)
CREATININE: 0.63 mg/dL (ref 0.44–1.00)
GFR calc Af Amer: >60 mL/min (ref >60)
GFR calc non Af Amer: >60 mL/min (ref >60)
GLUCOSE: 69 mg/dL (ref 65–99)
Potassium: 4.3 mmol/L (ref 3.5–5.1)
Sodium: 132 mmol/L — ABNORMAL LOW (ref 135–145)

## 2017-07-11 LAB — CBC WITH DIFFERENTIAL/PLATELET
Basophils Absolute: 0 10*3/uL (ref 0.0–0.1)
Basophils Relative: 0 %
Eosinophils Absolute: 0.1 10*3/uL (ref 0.0–0.7)
Eosinophils Relative: 1 %
HEMATOCRIT: 33.7 % — AB (ref 36.0–46.0)
HEMOGLOBIN: 12.1 g/dL (ref 12.0–15.0)
LYMPHS PCT: 8 %
Lymphs Abs: 0.7 10*3/uL (ref 0.7–4.0)
MCH: 30.8 pg (ref 26.0–34.0)
MCHC: 35.9 g/dL (ref 30.0–36.0)
MCV: 85.8 fL (ref 78.0–100.0)
MONO ABS: 0.7 10*3/uL (ref 0.1–1.0)
MONOS PCT: 7 %
NEUTROS ABS: 7.5 10*3/uL (ref 1.7–7.7)
Neutrophils Relative %: 84 %
Platelets: 341 10*3/uL (ref 150–400)
RBC: 3.93 MIL/uL (ref 3.87–5.11)
RDW: 12.1 % (ref 11.5–15.5)
WBC: 9 10*3/uL (ref 4.0–10.5)

## 2017-07-11 LAB — HIV ANTIBODY (ROUTINE TESTING W REFLEX): HIV SCREEN 4TH GENERATION: NONREACTIVE

## 2017-07-11 LAB — MAGNESIUM: Magnesium: 2.7 mg/dL — ABNORMAL HIGH (ref 1.7–2.4)

## 2017-07-11 MED ORDER — LORAZEPAM 2 MG/ML IJ SOLN
0.5000 mg | Freq: Once | INTRAMUSCULAR | Status: AC
  Administered 2017-07-11: 0.5 mg via INTRAVENOUS
  Filled 2017-07-11: qty 1

## 2017-07-11 NOTE — Consult Note (Signed)
Reason for Consult: SBO Chief complaint: Nausea, vomiting, and abdominal systems.  Unable to eat since 07/07/17. Referring Physician: Dr. Dillard Cannon Sonia Hill is an 62 y.o. female.   HPI: 62 year old with metastatic breast cancer with multiple sclerotic bone lesions,  and biopsy-proven rectal metastasis. She has been treated [facility] [place] in [place] in November, last year.  She underwent laparoscopic diverting colostomy, and initially was doing fine.  There are notes from 05/22/17 showing issues of concern for bowel obstruction.   Patient is being followed here by Dr. [name].  She notes that when she is on liquids she does fairly well.  But whenever she eats something more solid she becomes more obstructed.  She has had some issues with prolapse.  She is also very concerned she has an undiagnosed colitis.  She was seen by Dr. Laurence Spates here [facility] and was scheduled for colonoscopy but can only do a sigmoidoscopy secondary to an inadequate prep.  Workup in the ED yesterday started with a single plain film ordered by Dr. [name] as an outpatient which showed development of moderate small bowel dilatation and air-fluid levels small bowel or colonic there was no free air or other acute.  CT scan of the pelvis and abdomen was in the emergency department.  CT shows large fungating infiltrating rectal mass which is stable.  Distal small bowel obstruction.  Contrast in the colon which was thought to be present from prior study 07/03/2017.  Stable left lower quadrant colostomy.  There is right sided hydroureteronephrosis.  There was a curvilinear radiodensity in the right pelvis possibly a fracture/routine.  An NG was placed, and we are asked to see.  Admission labs shows a sodium of 125, chloride of 89, remainder of the CMP is normal.  CBC shows a white count of 8.6, hemoglobin 12.5, hematocrit 30, platelets 391,000.  CA 125 was 188.2 on 07/05/17.  CEA on 07/05/17/130. She received Gastrografin study  2320 hrs on the first evening.  Film this a.m. shows: Contrast in the right transverse colon.  Ongoing small bowel dilatation.  NG put out a liter last evening.  She is afebrile and vital signs are stable.  Sodium is better at 132, chloride is 100.  CBC is stable.  Past Medical History:  Diagnosis Date  . Arthritis    neck, upper back, shoulders - no meds - yoga  . Chronic kidney disease    Only has right kidney  . Depression    Hx - no current problem  . GERD (gastroesophageal reflux disease)    diet controlled - no meds  . Hyperlipidemia    diet controlled  . Hypothyroidism   . lt breast ca dx'd 11/2009  . Osteoporosis, unspecified 12/06/2013  . PONV (postoperative nausea and vomiting)   . SVD (spontaneous vaginal delivery)    x 1    Past Surgical History:  Procedure Laterality Date  . BLADDER NECK RECONSTRUCTION     made a new urethea tube  . bladder tact  2008  . BREAST SURGERY     masectomy left breast  . coloscopy    . COLOSTOMY  Nov 2018  [facility]    . DILATION AND CURETTAGE OF UTERUS    . LAPAROSCOPY N/A 01/24/2013   Procedure: LAPROSCOPY OPERATIVE;  Surgeon: Margarette Asal, MD;  Location: [facility];  Service: Gynecology;  Laterality: N/A;  . left kidney removed     left kidney dysfunction  . SALPINGOOPHORECTOMY Bilateral 01/24/2013  Procedure: SALPINGO OOPHORECTOMY;  Surgeon: Margarette Asal, MD;  Location: [facility];  Service: Gynecology;  Laterality: Bilateral;  . WISDOM TOOTH EXTRACTION      Family History  Problem Relation Age of Onset  . Breast cancer Mother 88  . Breast cancer Maternal Aunt 58  . Breast cancer Other        MGF's sister dx with breast cancer in her 60s    Social History:  reports that  has never smoked. she has never used smokeless tobacco. She reports that she does not drink alcohol or use drugs. Tobacco: None Drugs: None EtOH: None She is married her husband is with her. She was a Electrical engineer. Allergies:    Allergies  Allergen Reactions  . Adhesive [Tape] Rash    Ok to use paper tape and tegaderm over IV site  . Betadine [Povidone Iodine] Rash  . Mercury Rash    Reaction mercurachrome  . Sulfa Antibiotics Rash    Prior to Admission medications   Medication Sig Start Date End Date Taking? Authorizing Provider  ALPRAZolam (XANAX) 0.25 MG tablet Take 0.25 mg by mouth every 8 (eight) hours as needed for anxiety or sleep.   Yes [provider]  baclofen (LIORESAL) 20 MG tablet Take 1 tablet (20 mg total) by mouth 3 (three) times daily. 06/28/17  Yes Magrinat, Virgie Dad, MD  cholecalciferol (D-VI-SOL) 400 UNIT/ML LIQD Take 400 Units by mouth daily.   Yes [provider]  exemestane (AROMASIN) 25 MG tablet Take 1 tablet (25 mg total) by mouth daily after breakfast. 07/05/17  Yes Magrinat, Virgie Dad, MD  ibuprofen (ADVIL,MOTRIN) 200 MG tablet Take 200 mg by mouth every 6 (six) hours as needed for moderate pain.   Yes [provider]  ondansetron (ZOFRAN-ODT) 8 MG disintegrating tablet Take 1 tablet (8 mg total) by mouth every 8 (eight) hours as needed for nausea or vomiting. 06/22/17  Yes Magrinat, Virgie Dad, MD  polyethylene glycol (MIRALAX / GLYCOLAX) packet Take 17 g by mouth daily.   Yes [provider]  promethazine (PHENERGAN) 25 MG suppository Place 1 suppository (25 mg total) rectally every 6 (six) hours as needed for nausea or vomiting. 06/22/17  Yes Magrinat, Virgie Dad, MD  simethicone (MYLICON) 662 MG chewable tablet Chew 125 mg by mouth every 6 (six) hours as needed for flatulence.   Yes [provider]  Thyroid (NATURE-THROID) 81.25 MG TABS Take 81.25 mg by mouth daily.   Yes [provider]  everolimus (AFINITOR) 5 MG tablet Take 1 tablet (5 mg total) by mouth daily. Take with a full glass of water, consistently with or without food. 07/06/17   Magrinat, Virgie Dad, MD  gabapentin (NEURONTIN) 300 MG capsule Take 1 capsule (300 mg total) by mouth  3 (three) times daily as needed. Patient taking differently: Take 300 mg by mouth 3 (three) times daily as needed (nerve pain).  06/28/17   Magrinat, Virgie Dad, MD  ondansetron (ZOFRAN ODT) 4 MG disintegrating tablet Take 1 tablet (4 mg total) by mouth every 8 (eight) hours as needed for nausea or vomiting. Patient not taking: Reported on 07/10/2017 06/19/17   Magrinat, Virgie Dad, MD  traMADol (ULTRAM) 50 MG tablet Take 1 tablet (50 mg total) by mouth every 6 (six) hours as needed. 06/28/17   Magrinat, Virgie Dad, MD     Results for orders placed or performed during the hospital encounter of 07/10/17 (from the past 48 hour(s))  CBC with Differential  Status: Abnormal   Collection Time: 07/10/17  1:14 PM  Result Value Ref Range   WBC 8.6 4.0 - 10.5 K/uL   RBC 4.05 3.87 - 5.11 MIL/uL   Hemoglobin 12.5 12.0 - 15.0 g/dL   HCT 34.5 (L) 36.0 - 46.0 %   MCV 85.2 78.0 - 100.0 fL   MCH 30.9 26.0 - 34.0 pg   MCHC 36.2 (H) 30.0 - 36.0 g/dL   RDW 12.1 11.5 - 15.5 %   Platelets 391 150 - 400 K/uL   Neutrophils Relative % 78 %   Neutro Abs 6.8 1.7 - 7.7 K/uL   Lymphocytes Relative 12 %   Lymphs Abs 1.0 0.7 - 4.0 K/uL   Monocytes Relative 9 %   Monocytes Absolute 0.7 0.1 - 1.0 K/uL   Eosinophils Relative 1 %   Eosinophils Absolute 0.0 0.0 - 0.7 K/uL   Basophils Relative 0 %   Basophils Absolute 0.0 0.0 - 0.1 K/uL  Comprehensive metabolic panel     Status: Abnormal   Collection Time: 07/10/17  1:14 PM  Result Value Ref Range   Sodium 125 (L) 135 - 145 mmol/L   Potassium 3.7 3.5 - 5.1 mmol/L   Chloride 89 (L) 101 - 111 mmol/L   CO2 27 22 - 32 mmol/L   Glucose, Bld 87 65 - 99 mg/dL   BUN 9 6 - 20 mg/dL   Creatinine, Ser 0.68 0.44 - 1.00 mg/dL   Calcium 9.4 8.9 - 10.3 mg/dL   Total Protein 6.3 (L) 6.5 - 8.1 g/dL   Albumin 3.7 3.5 - 5.0 g/dL   AST 26 15 - 41 U/L   ALT 15 14 - 54 U/L   Alkaline Phosphatase 70 38 - 126 U/L   Total Bilirubin 0.5 0.3 - 1.2 mg/dL   GFR calc non Af Amer >60 >60  mL/min   GFR calc Af Amer >60 >60 mL/min    Comment: (NOTE) The eGFR has been calculated using the CKD EPI equation. This calculation has not been validated in all clinical situations. eGFR's persistently <60 mL/min signify possible Chronic Kidney Disease.    Anion gap 9 5 - 15  Lipase, blood     Status: None   Collection Time: 07/10/17  1:14 PM  Result Value Ref Range   Lipase 45 11 - 51 U/L  CBC with Differential/Platelet     Status: Abnormal   Collection Time: 07/11/17  4:37 AM  Result Value Ref Range   WBC 9.0 4.0 - 10.5 K/uL   RBC 3.93 3.87 - 5.11 MIL/uL   Hemoglobin 12.1 12.0 - 15.0 g/dL   HCT 33.7 (L) 36.0 - 46.0 %   MCV 85.8 78.0 - 100.0 fL   MCH 30.8 26.0 - 34.0 pg   MCHC 35.9 30.0 - 36.0 g/dL   RDW 12.1 11.5 - 15.5 %   Platelets 341 150 - 400 K/uL   Neutrophils Relative % 84 %   Neutro Abs 7.5 1.7 - 7.7 K/uL   Lymphocytes Relative 8 %   Lymphs Abs 0.7 0.7 - 4.0 K/uL   Monocytes Relative 7 %   Monocytes Absolute 0.7 0.1 - 1.0 K/uL   Eosinophils Relative 1 %   Eosinophils Absolute 0.1 0.0 - 0.7 K/uL   Basophils Relative 0 %   Basophils Absolute 0.0 0.0 - 0.1 K/uL  Basic metabolic panel     Status: Abnormal   Collection Time: 07/11/17  4:37 AM  Result Value Ref Range   Sodium 132 (  L) 135 - 145 mmol/L    Comment: DELTA CHECK NOTED REPEATED TO VERIFY    Potassium 4.3 3.5 - 5.1 mmol/L   Chloride 100 (L) 101 - 111 mmol/L   CO2 22 22 - 32 mmol/L   Glucose, Bld 69 65 - 99 mg/dL   BUN 8 6 - 20 mg/dL   Creatinine, Ser 0.63 0.44 - 1.00 mg/dL   Calcium 9.1 8.9 - 10.3 mg/dL   GFR calc non Af Amer >60 >60 mL/min   GFR calc Af Amer >60 >60 mL/min    Comment: (NOTE) The eGFR has been calculated using the CKD EPI equation. This calculation has not been validated in all clinical situations. eGFR's persistently <60 mL/min signify possible Chronic Kidney Disease.    Anion gap 10 5 - 15  Magnesium     Status: Abnormal   Collection Time: 07/11/17  4:37 AM  Result Value  Ref Range   Magnesium 2.7 (H) 1.7 - 2.4 mg/dL    Ct Abdomen Pelvis W Contrast  Result Date: 07/10/2017 CLINICAL DATA:  History of breast cancer and rectal cancer. Abdominal pain and bloating. EXAM: CT ABDOMEN AND PELVIS WITH CONTRAST TECHNIQUE: Multidetector CT imaging of the abdomen and pelvis was performed using the standard protocol following bolus administration of intravenous contrast. CONTRAST:  80 cc  ISOVUE-300 IOPAMIDOL (ISOVUE-300) INJECTION 61% COMPARISON:  CT scan 05/22/2017 FINDINGS: Lower chest: The lung bases are clear. The heart is normal in size. No pericardial effusion. Hepatobiliary: No focal hepatic lesions to suggest metastatic disease. The gallbladder appears normal. No common bile duct dilatation. Pancreas: No mass, inflammation or ductal dilatation. Spleen: Normal size.  No focal lesions. Adrenals/Urinary Tract: Adrenal glands are normal. The left kidney is surgically absent. The right kidney demonstrates compensatory hypertrophy and moderate right-sided hydroureteronephrosis. Stomach/Bowel: The stomach, duodenum and small bowel are dilated and demonstrates scattered air-fluid levels. There is a transition to decompressed small bowel loops in the right lower quadrant. Findings consistent with a mid distal small bowel obstruction. Contrast in the colon is likely from the previous small bowel follow-through from 07/03/2017. Stable left lower quadrant colostomy. Large infiltrating lower rectal mass which appears to involve the anus, presacral soft tissues and the uterus. Vascular/Lymphatic: The major vascular structures are patent. Small scattered mesenteric and retroperitoneal lymph nodes are stable. Reproductive: The uterus appears to be involved with rectal tumor. Other: There is a curvilinear radiodensity in the right pelvis which was not present on the prior study. It may be a fractured catheter tip or prior ureteral stent. Recommend correlation with clinical history. Musculoskeletal:  No significant bony findings. IMPRESSION: 1. Large fungating/infiltrating rectal mass, relatively stable. 2. Mid distal small bowel obstruction. 3. Right-sided hydroureteronephrosis relatively stable when compared to prior study. 4. Curvilinear radiodensity in the right pelvis possibly a fractured/retained catheter tip. Electronically Signed   By: Marijo Sanes M.D.   On: 07/10/2017 16:21   Dg Abd 2 Views  Result Date: 07/10/2017 CLINICAL DATA:  Bloating. Abdominal pain and nausea. Colostomy in November. Metastatic breast cancer. EXAM: ABDOMEN - 2 VIEW COMPARISON:  07/03/2017 small bowel follow-through. FINDINGS: Upright and supine views. The upright view demonstrates no free intraperitoneal air. Surgical clips about the left lower chest. Air-fluid levels which could be within large or small bowel loops. Supine image demonstrates gaseous distension of small bowel including at 5.1 cm. IMPRESSION: Development of moderate small bowel dilatation with bowel air-fluid levels. Suspicious for distal small bowel or colonic obstruction. Adynamic ileus is felt less likely.  No free intraperitoneal air or other acute complication. Electronically Signed   By: Abigail Miyamoto M.D.   On: 07/10/2017 10:51   Dg Abd Portable 1v-small Bowel Protocol-position Verification  Result Date: 07/10/2017 CLINICAL DATA:  STAT READ. eval for NG tube placement for start of small bowel protocol. abd pain. Nausea for several days. EXAM: PORTABLE ABDOMEN - 1 VIEW COMPARISON:  CT 07/10/2017 FINDINGS: Nasogastric tube is in place, tip overlying the level of stomach. There is persistent significant dilatation of small bowel loops, greater than dilatation of large bowel loops and consistent with small bowel obstruction. Contrast is identified within the colon and urinary bladder following CT earlier. IMPRESSION: Nasogastric tube to the region of the stomach. Small bowel obstruction. Electronically Signed   By: Nolon Nations M.D.   On: 07/10/2017  19:03    Review of Systems  Constitutional: Positive for malaise/fatigue and weight loss (10-12 lbs last 3 weeks). Negative for diaphoresis.  HENT: Negative.   Eyes: Negative.   Respiratory: Negative.   Cardiovascular: Negative.   Gastrointestinal: Positive for abdominal pain, heartburn (has very freuqnet heartburn/GERD, trouble lying down sometimes), nausea and vomiting. Negative for blood in stool, constipation, diarrhea and melena.       Almost no stool since 1/25, until this a.m.  Genitourinary: Negative.   Musculoskeletal: Negative.   Skin: Negative.   Neurological: Negative.  Negative for weakness.  Endo/Heme/Allergies: Negative.   Psychiatric/Behavioral: Negative.    Blood pressure 108/74, pulse 94, temperature 98.1 F (36.7 C), temperature source Oral, resp. rate 18, height '5\' 1"'  (1.549 m), weight 47.3 kg (104 lb 3.2 oz), SpO2 99 %. Physical Exam  Constitutional: She is oriented to person, place, and time. No distress.  Thin cachectic female in no acute distress.  NG in place and looks fairly uncomfortable.  HENT:  Head: Normocephalic and atraumatic.  Mouth/Throat: No oropharyngeal exudate.  Eyes: Right eye exhibits no discharge. Left eye exhibits no discharge. No scleral icterus.  Pupils are equal  Neck: Normal range of motion. Neck supple. No JVD present. No tracheal deviation present. No thyromegaly present.  Cardiovascular: Normal rate, regular rhythm, normal heart sounds and intact distal pulses.  No murmur heard. Respiratory: Effort normal and breath sounds normal. No respiratory distress. She has no wheezes. She has no rales. She exhibits no tenderness.  Left mastectomy well-healed.  GI: Soft. She exhibits no distension and no mass. There is no tenderness. There is no rebound and no guarding.  She has a left-sided colostomy there is soft stool throughout the colostomy bag this a.m.  We took the bag off and examine the stoma.  Pictures below.  We also placed a finger  within the lumen of the stoma.  This was fairly tight but I could get the distal tip of my small finger into this.  Musculoskeletal: She exhibits no edema or tenderness.  Lymphadenopathy:    She has no cervical adenopathy.  Neurological: She is alert and oriented to person, place, and time. No cranial nerve deficit.  Skin: Skin is warm and dry. No rash noted. She is not diaphoretic. No erythema. No pallor.  Psychiatric: She has a normal mood and affect. Her behavior is normal. Judgment and thought content normal.     I could get my small finger tip into the ostomy ~ 1cm into the ostomy, but it was very tight.   Assessment/Plan: Small bowel obstruction Metastatic breast cancer with bone and rectal metastasis. S/P transverse loop colostomy, 04/2017, [place]  also appears to be involved with a rectal tumor. Ongoing weight loss Surgically absent left kidney age 28, right-sided hydroureteronephrosis -stable Hypothyroid Right pelvis catheter tip  Plan: Not allot coming out of the NG now.  I would keep her NPO, except some ice chips for now and continue to try to decompress her.  I would defer to Medicine and Dr. [name] on further GI evaluation.  She does now have contrast in the right and transverse colon, with soft stool in the ostomy bag this AM.   Earnstine Regal 07/11/2017, 7:12 AM

## 2017-07-11 NOTE — Progress Notes (Signed)
Initial Nutrition Assessment  DOCUMENTATION CODES:   Severe malnutrition in context of chronic illness  INTERVENTION:   Diet advancement per MD If patient expected to remain NPO for more than 8 days, consider nutrition support given malnutrition.  Briefly discussed diet after discharge with patient and visitor at bedside   NUTRITION DIAGNOSIS:   Severe Malnutrition related to cancer and cancer related treatments, chronic illness as evidenced by percent weight loss, energy intake < or equal to 75% for > or equal to 1 month, severe fat depletion, moderate muscle depletion.  GOAL:   Patient will meet greater than or equal to 90% of their needs  MONITOR:   Diet advancement, Weight trends, Labs, I & O's  REASON FOR ASSESSMENT:   Malnutrition Screening Tool, Consult    ASSESSMENT:   62 year old with metastatic breast cancer with multiple sclerotic bone lesions,  and biopsy-proven rectal metastasis. She has been treated [facility] [place] in [place] in November, last year.  She underwent laparoscopic diverting colostomy, and initially was doing fine.  Prior to visit, RD approached by staff and pt's husband regarding needing nutrition education. Pt's husband states that the pt had an appointment made with a RD a the [facility].   When RD visited, patient in room with visitor at bedside. Pt did not speak much d/t discomfort from NGT. Noted output in room to be ~700 ml. Pt continues to be NPO. Pt hopeful she will get to eat soon.  Pt has been unable to eat anything since 1/25. Mainly consuming mashed potatoes and bone broth. The last solid food she ate was a subway sandwich on 1/24. Pt developed N/V after consuming the sandwich. Last vomiting episode was 1/25. Pt has had poor colostomy output for 48 hours. Began to have output this morning.   Pt has been trying to combat recurrent constipation and diarrhea with miralax and "smooth move tea". Pt states that she eventually was  unable to tolerate this tea. Pt was making protein shakes using supergreen mixtures and [employer] brand plant based protein. Pt with many diet related questions for when she discharges. RD made some suggestions but will provide handout and resources once diet is advanced and pt closer to discharge. Encouraged pt to keep appointment with Charlotte RD as well.  Per chart, pt has lost 9 lb since 12/13 (9% wt loss x 1.5 months, significant for time frame).   Medications reviewed.  Labs reviewed: Low Na, Mg   NUTRITION - FOCUSED PHYSICAL EXAM:    Most Recent Value  Orbital Region  Moderate depletion  Upper Arm Region  Severe depletion  Thoracic and Lumbar Region  Unable to assess  Buccal Region  Mild depletion  Temple Region  Moderate depletion  Clavicle Bone Region  Moderate depletion  Clavicle and Acromion Bone Region  Moderate depletion  Scapular Bone Region  Unable to assess  Dorsal Hand  Mild depletion  Patellar Region  Unable to assess  Anterior Thigh Region  Unable to assess  Posterior Calf Region  Unable to assess  Edema (RD Assessment)  None       Diet Order:  Diet NPO time specified  EDUCATION NEEDS:   Education needs have been addressed  Skin:  Skin Assessment: Reviewed RN Assessment  Last BM:   colostomy output  Height:   Ht Readings from Last 1 Encounters:  07/10/17 5\' 1"  (1.549 m)    Weight:   Wt Readings from Last 1 Encounters:  07/10/17 104 lb 3.2 oz (  47.3 kg)    Ideal Body Weight:  47.7 kg  BMI:  Body mass index is 19.69 kg/m.  Estimated Nutritional Needs:   Kcal:  1350-1550  Protein:  60-70g  Fluid:  1.5L/day   Clayton Bibles, MS, RD, LDN [facility] Pager: [phone] After Hours Pager: [phone]

## 2017-07-11 NOTE — Progress Notes (Signed)
PROGRESS NOTE  Sonia Hill WNI:[id number] DOB: September 01, 1955 DOA: 07/10/2017 PCP: [name], MD  HPI/Recap of past 24 hours:  On ng suction, she reports ng is irritating her throat, but Abdomen is  less distended, she started to have colostomy output No fever.  Family at bedside  Assessment/Plan: Active Problems:   SBO (small bowel obstruction) (HCC)   Recurrent sbo: -Keep npo, continue hydration, prn antiemetics, analgesic, start iv ppi,  -keep K>4, mag>2 -ng placed, she is on small bowel protocol, started to have colostomy output, ab less distended -repeat ab x ray small bowel remain dilated at 4cm but contrast through colon -General surgery following, input appreciated  patient and family  Are ssking if  patient has colitis in large bowel. I advised  Patient to follow GI Dr Dagmar Hait as outpatient once sbo issue resolves.   Hyponatremia: likely from dehydration. Continue hydration. Improving.  Metastatic breast cancer with rectal mets s/p colostomy Oncology Dr [name] following   Right-sided hydroureteronephrosis relatively stable when compared to prior study on CT scan. She denies flank pain, cr wnl.  Curvilinear radiodensity in the right pelvis possibly a fractured/retained catheter tip. Incidental finding on CT ab/pel.   Malnutrition: Body mass index is 18.14 kg/m.   DVT prophylaxis: scd   Code Status: full  Family Communication: patient and family at bedside  Disposition Plan: not ready to discharge    Consultants:  General surgery  oncology  Procedures:  Ng placement  Antibiotics:  none   Objective: BP 108/74 (BP Location: Right Arm)   Pulse 94   Temp 98.1 F (36.7 C) (Oral)   Resp 18   Ht 5\' 1"  (1.549 m)   Wt 47.3 kg (104 lb 3.2 oz)   SpO2 99%   BMI 19.69 kg/m   Intake/Output Summary (Last 24 hours) at 07/11/2017 0848 Last data filed at 07/11/2017 0600 Gross per 24 hour  Intake 1000 ml  Output 1003 ml  Net -3 ml     Filed Weights   07/10/17 1129 07/10/17 2002  Weight: 43.5 kg (96 lb) 47.3 kg (104 lb 3.2 oz)    Exam: Patient is examined daily including today on 07/11/2017, exams remain the same as of yesterday except that has changed    General:  NAD, thin, ng on suction  Cardiovascular: RRR  Respiratory: CTABL  Abdomen: less distended ab, tenderness has resolved, hypoactive BS, liquid brown stool output in colostomy  Musculoskeletal: No Edema  Neuro: alert, oriented   Data Reviewed: Basic Metabolic Panel: Recent Labs  Lab 07/05/17 1250 07/10/17 1314 07/11/17 0437  NA 135* 125* 132*  K 4.9* 3.7 4.3  CL 99 89* 100*  CO2 29 27 22   GLUCOSE 85 87 69  BUN 10 9 8   CREATININE 0.86 0.68 0.63  CALCIUM 10.1 9.4 9.1  MG  --   --  2.7*   Liver Function Tests: Recent Labs  Lab 07/05/17 1250 07/10/17 1314  AST 21 26  ALT 16 15  ALKPHOS 78 70  BILITOT 0.6 0.5  PROT 6.8 6.3*  ALBUMIN 3.8 3.7   Recent Labs  Lab 07/10/17 1314  LIPASE 45   No results for input(s): AMMONIA in the last 168 hours. CBC: Recent Labs  Lab 07/05/17 1250 07/10/17 1314 07/11/17 0437  WBC 6.4 8.6 9.0  NEUTROABS 4.9 6.8 7.5  HGB 13.8 12.5 12.1  HCT 40.9 34.5* 33.7*  MCV 90.1 85.2 85.8  PLT 405* 391 341   Cardiac Enzymes:   No results  for input(s): CKTOTAL, CKMB, CKMBINDEX, TROPONINI in the last 168 hours. BNP (last 3 results) No results for input(s): BNP in the last 8760 hours.  ProBNP (last 3 results) No results for input(s): PROBNP in the last 8760 hours.  CBG: No results for input(s): GLUCAP in the last 168 hours.  No results found for this or any previous visit (from the past 240 hour(s)).   Studies: Ct Abdomen Pelvis W Contrast  Result Date: 07/10/2017 CLINICAL DATA:  History of breast cancer and rectal cancer. Abdominal pain and bloating. EXAM: CT ABDOMEN AND PELVIS WITH CONTRAST TECHNIQUE: Multidetector CT imaging of the abdomen and pelvis was performed using the standard protocol  following bolus administration of intravenous contrast. CONTRAST:  80 cc  ISOVUE-300 IOPAMIDOL (ISOVUE-300) INJECTION 61% COMPARISON:  CT scan 05/22/2017 FINDINGS: Lower chest: The lung bases are clear. The heart is normal in size. No pericardial effusion. Hepatobiliary: No focal hepatic lesions to suggest metastatic disease. The gallbladder appears normal. No common bile duct dilatation. Pancreas: No mass, inflammation or ductal dilatation. Spleen: Normal size.  No focal lesions. Adrenals/Urinary Tract: Adrenal glands are normal. The left kidney is surgically absent. The right kidney demonstrates compensatory hypertrophy and moderate right-sided hydroureteronephrosis. Stomach/Bowel: The stomach, duodenum and small bowel are dilated and demonstrates scattered air-fluid levels. There is a transition to decompressed small bowel loops in the right lower quadrant. Findings consistent with a mid distal small bowel obstruction. Contrast in the colon is likely from the previous small bowel follow-through from 07/03/2017. Stable left lower quadrant colostomy. Large infiltrating lower rectal mass which appears to involve the anus, presacral soft tissues and the uterus. Vascular/Lymphatic: The major vascular structures are patent. Small scattered mesenteric and retroperitoneal lymph nodes are stable. Reproductive: The uterus appears to be involved with rectal tumor. Other: There is a curvilinear radiodensity in the right pelvis which was not present on the prior study. It may be a fractured catheter tip or prior ureteral stent. Recommend correlation with clinical history. Musculoskeletal: No significant bony findings. IMPRESSION: 1. Large fungating/infiltrating rectal mass, relatively stable. 2. Mid distal small bowel obstruction. 3. Right-sided hydroureteronephrosis relatively stable when compared to prior study. 4. Curvilinear radiodensity in the right pelvis possibly a fractured/retained catheter tip. Electronically Signed    By: Marijo Sanes M.D.   On: 07/10/2017 16:21   Dg Abd 2 Views  Result Date: 07/10/2017 CLINICAL DATA:  Bloating. Abdominal pain and nausea. Colostomy in November. Metastatic breast cancer. EXAM: ABDOMEN - 2 VIEW COMPARISON:  07/03/2017 small bowel follow-through. FINDINGS: Upright and supine views. The upright view demonstrates no free intraperitoneal air. Surgical clips about the left lower chest. Air-fluid levels which could be within large or small bowel loops. Supine image demonstrates gaseous distension of small bowel including at 5.1 cm. IMPRESSION: Development of moderate small bowel dilatation with bowel air-fluid levels. Suspicious for distal small bowel or colonic obstruction. Adynamic ileus is felt less likely. No free intraperitoneal air or other acute complication. Electronically Signed   By: Abigail Miyamoto M.D.   On: 07/10/2017 10:51   Dg Abd Portable 1v-small Bowel Obstruction Protocol-initial, 8 Hr Delay  Result Date: 07/11/2017 CLINICAL DATA:  Small bowel obstruction. EXAM: PORTABLE ABDOMEN - 1 VIEW COMPARISON:  07/10/2017 FINDINGS: There is a nasogastric tube with the tip projecting over the body of the stomach. There is persistent small bowel dilatation measuring up to 4 cm. There is oral contrast material which has transited into the colon. There is no evidence of pneumoperitoneum, portal venous gas  or pneumatosis. There are no pathologic calcifications along the expected course of the ureters. The osseous structures are unremarkable. IMPRESSION: 1. Nasogastric tube with the tip projecting over the body of the stomach. 2. Small bowel dilatation measuring up to 4 cm with oral contrast material which has transited into the colon. Findings are suggestive of a partial small bowel obstruction. Electronically Signed   By: Kathreen Devoid   On: 07/11/2017 08:14   Dg Abd Portable 1v-small Bowel Protocol-position Verification  Result Date: 07/10/2017 CLINICAL DATA:  STAT READ. eval for NG tube  placement for start of small bowel protocol. abd pain. Nausea for several days. EXAM: PORTABLE ABDOMEN - 1 VIEW COMPARISON:  CT 07/10/2017 FINDINGS: Nasogastric tube is in place, tip overlying the level of stomach. There is persistent significant dilatation of small bowel loops, greater than dilatation of large bowel loops and consistent with small bowel obstruction. Contrast is identified within the colon and urinary bladder following CT earlier. IMPRESSION: Nasogastric tube to the region of the stomach. Small bowel obstruction. Electronically Signed   By: Nolon Nations M.D.   On: 07/10/2017 19:03    Scheduled Meds: . pantoprazole (PROTONIX) IV  40 mg Intravenous Q24H    Continuous Infusions: . sodium chloride 100 mL/hr at 07/10/17 1711     Time spent: 35 mins, case discussed  With general surgery I have personally reviewed and interpreted on  07/11/2017 daily labs,  imagings as discussed above under date review session and assessment and plans.  I reviewed all nursing notes,  consultant notes,  vitals, pertinent old records  I have discussed plan of care as described above with RN , patient and family on 07/11/2017   Florencia Reasons MD, PhD  [facility] Pager [phone]. If 7PM-7AM, please contact night-coverage at [email or web address], password [facility] 07/11/2017, 8:48 AM  LOS: 1 day

## 2017-07-11 NOTE — Progress Notes (Signed)
Sonia Hill   DOB:1955/11/30   EN#:[id number]   TIR#:[id number]  Subjective:  Sonia Hill called our office yesterday to report bloating, nausea and abdominal spasms. We set her up for KUB which was read as suspicious for SBO and she was referred to the ED where CT abd/pelvis confirmed a mid-distal SBO. She was started on fluids and ng suction and feels much more comfortable this AM except of course for the discomfort of the ng itself. No /minimal colostomy output past 48 h. Husband in room   Objective: middle aged White woman examined in bed  Vitals:   07/10/17 2002 07/11/17 0501  BP: 130/80 108/74  Pulse: 81 94  Resp: 18 18  Temp: 98.4 F (36.9 C) 98.1 F (36.7 C)  SpO2: 100% 99%    Body mass index is 19.69 kg/m.  Intake/Output Summary (Last 24 hours) at 07/11/2017 0813 Last data filed at 07/11/2017 0600 Gross per 24 hour  Intake 1000 ml  Output 1003 ml  Net -3 ml     Sclerae unicteric  Lungs no rales or wheezes--auscultated anterolaterally  Heart regular rate and rhythm  Abdomen soft, +BS, about 15 cc material in colostomy bag  Neuro nonfocal  Breast exam: deferred  CBG (last 3)  No results for input(s): GLUCAP in the last 72 hours.   Labs:  Lab Results  Component Value Date   WBC 9.0 07/11/2017   HGB 12.1 07/11/2017   HCT 33.7 (L) 07/11/2017   MCV 85.8 07/11/2017   PLT 341 07/11/2017   NEUTROABS 7.5 07/11/2017    '@LASTCHEMISTRY' @  Urine Studies No results for input(s): UHGB, CRYS in the last 72 hours.  Invalid input(s): UACOL, UAPR, USPG, UPH, UTP, UGL, UKET, UBIL, UNIT, UROB, ULEU, UEPI, UWBC, URBC, UBAC, CAST, UCOM, [place]  Basic Metabolic Panel: Recent Labs  Lab 07/05/17 1250 07/10/17 1314 07/11/17 0437  NA 135* 125* 132*  K 4.9* 3.7 4.3  CL 99 89* 100*  CO2 '29 27 22  ' GLUCOSE 85 87 69  BUN '10 9 8  ' CREATININE 0.86 0.68 0.63  CALCIUM 10.1 9.4 9.1  MG  --   --  2.7*   GFR Estimated Creatinine Clearance: 54.4 mL/min (by C-G formula based on SCr of  0.63 mg/dL). Liver Function Tests: Recent Labs  Lab 07/05/17 1250 07/10/17 1314  AST 21 26  ALT 16 15  ALKPHOS 78 70  BILITOT 0.6 0.5  PROT 6.8 6.3*  ALBUMIN 3.8 3.7   Recent Labs  Lab 07/10/17 1314  LIPASE 45   No results for input(s): AMMONIA in the last 168 hours. Coagulation profile No results for input(s): INR, PROTIME in the last 168 hours.  CBC: Recent Labs  Lab 07/05/17 1250 07/10/17 1314 07/11/17 0437  WBC 6.4 8.6 9.0  NEUTROABS 4.9 6.8 7.5  HGB 13.8 12.5 12.1  HCT 40.9 34.5* 33.7*  MCV 90.1 85.2 85.8  PLT 405* 391 341   Cardiac Enzymes: No results for input(s): CKTOTAL, CKMB, CKMBINDEX, TROPONINI in the last 168 hours. BNP: Invalid input(s): POCBNP CBG: No results for input(s): GLUCAP in the last 168 hours. D-Dimer No results for input(s): DDIMER in the last 72 hours. Hgb A1c No results for input(s): HGBA1C in the last 72 hours. Lipid Profile No results for input(s): CHOL, HDL, LDLCALC, TRIG, CHOLHDL, LDLDIRECT in the last 72 hours. Thyroid function studies No results for input(s): TSH, T4TOTAL, T3FREE, THYROIDAB in the last 72 hours.  Invalid input(s): FREET3 Anemia work up No results for input(s): VITAMINB12, FOLATE,  FERRITIN, TIBC, IRON, RETICCTPCT in the last 72 hours. Microbiology No results found for this or any previous visit (from the past 240 hour(s)).    Studies:  Ct Abdomen Pelvis W Contrast  Result Date: 07/10/2017 CLINICAL DATA:  History of breast cancer and rectal cancer. Abdominal pain and bloating. EXAM: CT ABDOMEN AND PELVIS WITH CONTRAST TECHNIQUE: Multidetector CT imaging of the abdomen and pelvis was performed using the standard protocol following bolus administration of intravenous contrast. CONTRAST:  80 cc  ISOVUE-300 IOPAMIDOL (ISOVUE-300) INJECTION 61% COMPARISON:  CT scan 05/22/2017 FINDINGS: Lower chest: The lung bases are clear. The heart is normal in size. No pericardial effusion. Hepatobiliary: No focal hepatic  lesions to suggest metastatic disease. The gallbladder appears normal. No common bile duct dilatation. Pancreas: No mass, inflammation or ductal dilatation. Spleen: Normal size.  No focal lesions. Adrenals/Urinary Tract: Adrenal glands are normal. The left kidney is surgically absent. The right kidney demonstrates compensatory hypertrophy and moderate right-sided hydroureteronephrosis. Stomach/Bowel: The stomach, duodenum and small bowel are dilated and demonstrates scattered air-fluid levels. There is a transition to decompressed small bowel loops in the right lower quadrant. Findings consistent with a mid distal small bowel obstruction. Contrast in the colon is likely from the previous small bowel follow-through from 07/03/2017. Stable left lower quadrant colostomy. Large infiltrating lower rectal mass which appears to involve the anus, presacral soft tissues and the uterus. Vascular/Lymphatic: The major vascular structures are patent. Small scattered mesenteric and retroperitoneal lymph nodes are stable. Reproductive: The uterus appears to be involved with rectal tumor. Other: There is a curvilinear radiodensity in the right pelvis which was not present on the prior study. It may be a fractured catheter tip or prior ureteral stent. Recommend correlation with clinical history. Musculoskeletal: No significant bony findings. IMPRESSION: 1. Large fungating/infiltrating rectal mass, relatively stable. 2. Mid distal small bowel obstruction. 3. Right-sided hydroureteronephrosis relatively stable when compared to prior study. 4. Curvilinear radiodensity in the right pelvis possibly a fractured/retained catheter tip. Electronically Signed   By: Marijo Sanes M.D.   On: 07/10/2017 16:21   Dg Abd 2 Views  Result Date: 07/10/2017 CLINICAL DATA:  Bloating. Abdominal pain and nausea. Colostomy in November. Metastatic breast cancer. EXAM: ABDOMEN - 2 VIEW COMPARISON:  07/03/2017 small bowel follow-through. FINDINGS: Upright  and supine views. The upright view demonstrates no free intraperitoneal air. Surgical clips about the left lower chest. Air-fluid levels which could be within large or small bowel loops. Supine image demonstrates gaseous distension of small bowel including at 5.1 cm. IMPRESSION: Development of moderate small bowel dilatation with bowel air-fluid levels. Suspicious for distal small bowel or colonic obstruction. Adynamic ileus is felt less likely. No free intraperitoneal air or other acute complication. Electronically Signed   By: Abigail Miyamoto M.D.   On: 07/10/2017 10:51   Dg Abd Portable 1v-small Bowel Protocol-position Verification  Result Date: 07/10/2017 CLINICAL DATA:  STAT READ. eval for NG tube placement for start of small bowel protocol. abd pain. Nausea for several days. EXAM: PORTABLE ABDOMEN - 1 VIEW COMPARISON:  CT 07/10/2017 FINDINGS: Nasogastric tube is in place, tip overlying the level of stomach. There is persistent significant dilatation of small bowel loops, greater than dilatation of large bowel loops and consistent with small bowel obstruction. Contrast is identified within the colon and urinary bladder following CT earlier. IMPRESSION: Nasogastric tube to the region of the stomach. Small bowel obstruction. Electronically Signed   By: Nolon Nations M.D.   On: 07/10/2017 19:03  Assessment: 62 y.o. BRCA 1-2 negative [facility] woman with lobular breast cancer stage IV at presentation July 2011  (1) status post left mastectomy and sentinel lymph node sampling in July 2011 for a lower inner quadrant T1 N1 M1, stage IV invasive lobular breast cancer, grade 1, strongly estrogen and progesterone receptor-positive, HER2 negative with MIB-1 of 9% and no HER2 amplification,   (2) with multiple sclerotic bone lesions at presentation seen only on CT scan (not on bone scan or PET scan), but  with biopsy-proven metastatic disease to bone; and an elevated CA 27.29 at presentation,   (3)  Oncotype recurrence score of 4, predicts a good response to antiestrogens.  (4) Systemic treatment has consisted of             a) tamoxifen with evidence of response but poor tolerance             b) letrozole starting August 2012, discontinued October 2014 per patient              (5) single functioning kidney  (6) status post bilateral salpingo-oophorectomy 01/24/2013, with benign pathology  (7) osteoporosis; the patient refuses bisphosphonate therapy; started [facility] February 2014.               (a) bone density was obtained under the care of Dr. Matthew Saras at [facility], 06/25/2012, showing osteoporosis with a T score -2.6. This was repeated 12/20/2013, showing again osteoporosis with T-scores between -2.4 and -2.8.             (b) the patient refuses zolendronate, denosumab, or other pharmacologic intervention             (c) bone density on 12/30/2015 under Dr. Matthew Saras showing osteoporosis with T scores between -2.7 and -3.3  (8) the patient refuses standard Mammography or tomography; undergoing thermography screening of the right breast.  (9) PET scan 06/05/2015 shows rectal thickening and a presacral mass; biopsy of this area 10/09/2015 confirms metastatic lobular breast cancer, again estrogen receptor positive, HER-2 nonamplified.             (a) pelvic MRI 03/11/2016 confirms stability of disease             (b) chest CT and pelvic MRI 09/02/2016 shows no change in the circumferential rectal thickening or evidence of extension beyond the serosa and chest CT findings             (c) colonoscopy 02/23/2017 showed a very narrow rectal lumen with a few apparently uninvolved centimeters distally              (d) CT of the abdomen and pelvis 05/22/2017 (after diverting colostomy) shows interval increase in the size of the perirectal soft tissue masses.  (10) started fulvestrant and palbociclib 125 mg/ day [21/7] May 2017             (a) palbociclib dose  decreased to 100 mg daily [21/7] with second cycle, started 11/25/2015             (b) palbociclib discontinued 03/20/2017 with evidence of disease progression             (c) last fulvestrant dose 03/24/2017, discontinued with evidence of progression  (11) status [facility] November 2018  (12) started exemestane 07/05/2017, everolimus to follow  (13) small bowel obstruction:  (a) small bowel series 07/03/2017 shows no obstruction  (b) CT abd/pelvis 07/10/2017 c/w mid-distal SBO  Plan:  Sonia Hill has lobular breast cancer with abdominal involvement. Carcinomatosis with this type of cancer frequently does not show on CTs or PET scans. Unfortunately she had her diverting colostomy done in [facility] in [place] so we don't have the surgical note, which likely documented this.   Sonia Hill is convinced she has a different problem, perhaps ulcerative colitis, and thinks a colonoscopy would establish this. She did have a colonoscopy under [facility] 02/23/2017 unfortunately the prep was poor and the procedure mainly recorded the involvement of the sigmoid/rectum by cancer. She has been trying to get Dr Watt Climes to perform a colostomy.  We had hoped with colostomy placement the obstruction issue could be resolved, but it looks like her situation will be more complex. She has resisted chemotherapy and it is true in very slow growing cancers like hers chemo may not be as useful. We had just started her on exemestane with plans to add everolimus, but that is on hold for now--both agents are po.  I am hopeful the SBO will clear with conservative measures but she may need more aggressive interventions.   I appreciate your help to this patient! Will follow with you    Bobetta Lime, MD 07/11/2017  8:13 AM Medical Oncology and Hematology [facility] [address] [place], [place] [address] Tel. [phone]    Fax. [phone]

## 2017-07-12 ENCOUNTER — Telehealth: Payer: Self-pay | Admitting: Pharmacist

## 2017-07-12 ENCOUNTER — Inpatient Hospital Stay (HOSPITAL_COMMUNITY): Payer: 59

## 2017-07-12 DIAGNOSIS — E43 Unspecified severe protein-calorie malnutrition: Secondary | ICD-10-CM

## 2017-07-12 LAB — BASIC METABOLIC PANEL
ANION GAP: 15 (ref 5–15)
Anion gap: 9 (ref 5–15)
BUN: 10 mg/dL (ref 6–20)
BUN: 7 mg/dL (ref 6–20)
CO2: 20 mmol/L — AB (ref 22–32)
CO2: 24 mmol/L (ref 22–32)
CREATININE: 0.73 mg/dL (ref 0.44–1.00)
Calcium: 9.4 mg/dL (ref 8.9–10.3)
Calcium: 9.3 mg/dL (ref 8.9–10.3)
Chloride: 103 mmol/L (ref 101–111)
Chloride: 99 mmol/L — ABNORMAL LOW (ref 101–111)
Creatinine, Ser: 0.57 mg/dL (ref 0.44–1.00)
GFR calc Af Amer: >60 mL/min (ref >60)
GLUCOSE: 50 mg/dL — AB (ref 65–99)
GLUCOSE: 186 mg/dL — AB (ref 65–99)
POTASSIUM: 3.4 mmol/L — AB (ref 3.5–5.1)
Potassium: 3.5 mmol/L (ref 3.5–5.1)
Sodium: 138 mmol/L (ref 135–145)
Sodium: 132 mmol/L — ABNORMAL LOW (ref 135–145)

## 2017-07-12 LAB — CBC WITH DIFFERENTIAL/PLATELET
BASOS ABS: 0 10*3/uL (ref 0.0–0.1)
BASOS PCT: 1 %
Eosinophils Absolute: 0 10*3/uL (ref 0.0–0.7)
Eosinophils Relative: 0 %
HCT: 37.6 % (ref 36.0–46.0)
Hemoglobin: 12.9 g/dL (ref 12.0–15.0)
Lymphocytes Relative: 9 %
Lymphs Abs: 0.8 10*3/uL (ref 0.7–4.0)
MCH: 30.3 pg (ref 26.0–34.0)
MCHC: 34.3 g/dL (ref 30.0–36.0)
MCV: 88.3 fL (ref 78.0–100.0)
MONO ABS: 0.5 10*3/uL (ref 0.1–1.0)
MONOS PCT: 5 %
Neutro Abs: 7.5 10*3/uL (ref 1.7–7.7)
Neutrophils Relative %: 85 %
PLATELETS: 367 10*3/uL (ref 150–400)
RBC: 4.26 MIL/uL (ref 3.87–5.11)
RDW: 12.5 % (ref 11.5–15.5)
WBC: 8.8 10*3/uL (ref 4.0–10.5)

## 2017-07-12 LAB — GLUCOSE, CAPILLARY
GLUCOSE-CAPILLARY: 104 mg/dL — AB (ref 65–99)
GLUCOSE-CAPILLARY: 213 mg/dL — AB (ref 65–99)
Glucose-Capillary: 39 mg/dL — CL (ref 65–99)
Glucose-Capillary: 262 mg/dL — ABNORMAL HIGH (ref 65–99)
Glucose-Capillary: 77 mg/dL (ref 65–99)

## 2017-07-12 LAB — LACTIC ACID, PLASMA: Lactic Acid, Venous: 0.9 mmol/L (ref 0.5–1.9)

## 2017-07-12 MED ORDER — DEXTROSE-NACL 5-0.45 % IV SOLN
INTRAVENOUS | Status: DC
  Administered 2017-07-12 (×3): via INTRAVENOUS

## 2017-07-12 MED ORDER — ALPRAZOLAM 0.25 MG PO TABS
0.2500 mg | ORAL_TABLET | Freq: Three times a day (TID) | ORAL | Status: DC | PRN
  Administered 2017-07-12 – 2017-07-13 (×4): 0.25 mg via ORAL
  Filled 2017-07-12 (×4): qty 1

## 2017-07-12 MED ORDER — DEXTROSE 50 % IV SOLN
INTRAVENOUS | Status: AC
  Administered 2017-07-12: 50 mL
  Filled 2017-07-12: qty 50

## 2017-07-12 MED ORDER — POTASSIUM CHLORIDE 20 MEQ PO PACK: 40.0000 meq | PACK | Freq: Once | ORAL | Status: DC

## 2017-07-12 MED ORDER — POTASSIUM CHLORIDE 10 MEQ/100ML IV SOLN
10.0000 meq | INTRAVENOUS | Status: AC
  Administered 2017-07-12 (×5): 10 meq via INTRAVENOUS
  Filled 2017-07-12 (×5): qty 100

## 2017-07-12 MED ORDER — POTASSIUM CHLORIDE CRYS ER 20 MEQ PO TBCR
40.0000 meq | EXTENDED_RELEASE_TABLET | Freq: Once | ORAL | Status: AC
  Administered 2017-07-12: 40 meq via ORAL
  Filled 2017-07-12: qty 2

## 2017-07-12 MED ORDER — ENOXAPARIN SODIUM 40 MG/0.4ML ~~LOC~~ SOLN
40.0000 mg | Freq: Every day | SUBCUTANEOUS | Status: DC
  Administered 2017-07-12 – 2017-07-14 (×3): 40 mg via SUBCUTANEOUS
  Filled 2017-07-12 (×2): qty 0.4

## 2017-07-12 MED ORDER — DEXTROSE 10 % IV BOLUS
250.0000 mL | Freq: Once | INTRAVENOUS | Status: AC
Start: 2017-07-12 — End: 2017-07-12
  Administered 2017-07-12: 250 mL via INTRAVENOUS

## 2017-07-12 MED ORDER — PHENOL 1.4 % MT LIQD
1.0000 | OROMUCOSAL | Status: DC | PRN
  Filled 2017-07-12: qty 177

## 2017-07-12 MED ORDER — MENTHOL 3 MG MT LOZG
1.0000 | LOZENGE | OROMUCOSAL | Status: DC | PRN
  Filled 2017-07-12 (×2): qty 9

## 2017-07-12 NOTE — Progress Notes (Signed)
Sonia Hill   DOB:1956-05-21   VO#:[id number]   KBT#:[id number]  Subjective:  Chantae feels better; has had significant pasty brown production through colostomy, emptied x4 past 18h per her report; decreased abd swelling; no pain; discomfort from ng; concerned she needs a colonoscopy for UC-- this is discussed below; labs show hypoglycemia but she has no symptoms from this; husband in room   Objective: middle aged White woman examined in bed  Vitals:   07/11/17 2132 07/12/17 0536  BP: 128/72 (!) 149/81  Pulse: 90 85  Resp: 16 16  Temp: 97.7 F (36.5 C)   SpO2: 99% 100%    Body mass index is 19.69 kg/m.  Intake/Output Summary (Last 24 hours) at 07/12/2017 0741 Last data filed at 07/12/2017 0536 Gross per 24 hour  Intake 2420 ml  Output 4075 ml  Net -1655 ml    Sclerae unicteric, EOMs intact Ng to suction in place No cervical or supraclavicular adenopathy Lungs no rales or rhonchi, auscultated anterolaterally Heart regular rate and rhythm Abd soft, nontender, positive bowel sounds, colostomy bag empty Neuro: nonfocal, well oriented, appropriate affect Breasts: Deferred   CBG (last 3)  No results for input(s): GLUCAP in the last 72 hours.   Labs:  Lab Results  Component Value Date   WBC 8.8 07/12/2017   HGB 12.9 07/12/2017   HCT 37.6 07/12/2017   MCV 88.3 07/12/2017   PLT 367 07/12/2017   NEUTROABS 7.5 07/12/2017    _0 @  Urine Studies No results for input(s): UHGB, CRYS in the last 72 hours.  Invalid input(s): UACOL, UAPR, USPG, UPH, UTP, UGL, UKET, UBIL, UNIT, UROB, ULEU, UEPI, UWBC, URBC, UBAC, CAST, [place], [place]  Basic Metabolic Panel: Recent Labs  Lab 07/05/17 1250 07/10/17 1314 07/11/17 0437 07/12/17 0433  NA 135* 125* 132* 138  K 4.9* 3.7 4.3 3.5  CL 99 89* 100* 103  CO2 _1 20*  GLUCOSE 85 87 69 50*  BUN _2 CREATININE 0.86 0.68 0.63 0.73  CALCIUM 10.1 9.4 9.1 9.4  MG  --   --  2.7*  --    GFR Estimated Creatinine  Clearance: 54.4 mL/min (by C-G formula based on SCr of 0.73 mg/dL). Liver Function Tests: Recent Labs  Lab 07/05/17 1250 07/10/17 1314  AST 21 26  ALT 16 15  ALKPHOS 78 70  BILITOT 0.6 0.5  PROT 6.8 6.3*  ALBUMIN 3.8 3.7   Recent Labs  Lab 07/10/17 1314  LIPASE 45   No results for input(s): AMMONIA in the last 168 hours. Coagulation profile No results for input(s): INR, PROTIME in the last 168 hours.  CBC: Recent Labs  Lab 07/05/17 1250 07/10/17 1314 07/11/17 0437 07/12/17 0433  WBC 6.4 8.6 9.0 8.8  NEUTROABS 4.9 6.8 7.5 7.5  HGB 13.8 12.5 12.1 12.9  HCT 40.9 34.5* 33.7* 37.6  MCV 90.1 85.2 85.8 88.3  PLT 405* 391 341 367   Cardiac Enzymes: No results for input(s): CKTOTAL, CKMB, CKMBINDEX, TROPONINI in the last 168 hours. BNP: Invalid input(s): POCBNP CBG: No results for input(s): GLUCAP in the last 168 hours. D-Dimer No results for input(s): DDIMER in the last 72 hours. Hgb A1c No results for input(s): HGBA1C in the last 72 hours. Lipid Profile No results for input(s): CHOL, HDL, LDLCALC, TRIG, CHOLHDL, LDLDIRECT in the last 72 hours. Thyroid function studies No results for input(s): TSH, T4TOTAL, T3FREE, THYROIDAB in the last 72 hours.  Invalid input(s): FREET3 Anemia work up No results  for input(s): VITAMINB12, FOLATE, FERRITIN, TIBC, IRON, RETICCTPCT in the last 72 hours. Microbiology No results found for this or any previous visit (from the past 240 hour(s)).    Studies:  Ct Abdomen Pelvis W Contrast  Result Date: 07/10/2017 CLINICAL DATA:  History of breast cancer and rectal cancer. Abdominal pain and bloating. EXAM: CT ABDOMEN AND PELVIS WITH CONTRAST TECHNIQUE: Multidetector CT imaging of the abdomen and pelvis was performed using the standard protocol following bolus administration of intravenous contrast. CONTRAST:  80 cc  ISOVUE-300 IOPAMIDOL (ISOVUE-300) INJECTION 61% COMPARISON:  CT scan 05/22/2017 FINDINGS: Lower chest: The lung bases are  clear. The heart is normal in size. No pericardial effusion. Hepatobiliary: No focal hepatic lesions to suggest metastatic disease. The gallbladder appears normal. No common bile duct dilatation. Pancreas: No mass, inflammation or ductal dilatation. Spleen: Normal size.  No focal lesions. Adrenals/Urinary Tract: Adrenal glands are normal. The left kidney is surgically absent. The right kidney demonstrates compensatory hypertrophy and moderate right-sided hydroureteronephrosis. Stomach/Bowel: The stomach, duodenum and small bowel are dilated and demonstrates scattered air-fluid levels. There is a transition to decompressed small bowel loops in the right lower quadrant. Findings consistent with a mid distal small bowel obstruction. Contrast in the colon is likely from the previous small bowel follow-through from 07/03/2017. Stable left lower quadrant colostomy. Large infiltrating lower rectal mass which appears to involve the anus, presacral soft tissues and the uterus. Vascular/Lymphatic: The major vascular structures are patent. Small scattered mesenteric and retroperitoneal lymph nodes are stable. Reproductive: The uterus appears to be involved with rectal tumor. Other: There is a curvilinear radiodensity in the right pelvis which was not present on the prior study. It may be a fractured catheter tip or prior ureteral stent. Recommend correlation with clinical history. Musculoskeletal: No significant bony findings. IMPRESSION: 1. Large fungating/infiltrating rectal mass, relatively stable. 2. Mid distal small bowel obstruction. 3. Right-sided hydroureteronephrosis relatively stable when compared to prior study. 4. Curvilinear radiodensity in the right pelvis possibly a fractured/retained catheter tip. Electronically Signed   By: Marijo Sanes M.D.   On: 07/10/2017 16:21   Dg Abd 2 Views  Result Date: 07/10/2017 CLINICAL DATA:  Bloating. Abdominal pain and nausea. Colostomy in November. Metastatic breast cancer.  EXAM: ABDOMEN - 2 VIEW COMPARISON:  07/03/2017 small bowel follow-through. FINDINGS: Upright and supine views. The upright view demonstrates no free intraperitoneal air. Surgical clips about the left lower chest. Air-fluid levels which could be within large or small bowel loops. Supine image demonstrates gaseous distension of small bowel including at 5.1 cm. IMPRESSION: Development of moderate small bowel dilatation with bowel air-fluid levels. Suspicious for distal small bowel or colonic obstruction. Adynamic ileus is felt less likely. No free intraperitoneal air or other acute complication. Electronically Signed   By: Abigail Miyamoto M.D.   On: 07/10/2017 10:51   Dg Abd Portable 1v-small Bowel Obstruction Protocol-initial, 8 Hr Delay  Result Date: 07/11/2017 CLINICAL DATA:  Small bowel obstruction. EXAM: PORTABLE ABDOMEN - 1 VIEW COMPARISON:  07/10/2017 FINDINGS: There is a nasogastric tube with the tip projecting over the body of the stomach. There is persistent small bowel dilatation measuring up to 4 cm. There is oral contrast material which has transited into the colon. There is no evidence of pneumoperitoneum, portal venous gas or pneumatosis. There are no pathologic calcifications along the expected course of the ureters. The osseous structures are unremarkable. IMPRESSION: 1. Nasogastric tube with the tip projecting over the body of the stomach. 2. Small bowel dilatation  measuring up to 4 cm with oral contrast material which has transited into the colon. Findings are suggestive of a partial small bowel obstruction. Electronically Signed   By: Kathreen Devoid   On: 07/11/2017 08:14   Dg Abd Portable 1v-small Bowel Protocol-position Verification  Result Date: 07/10/2017 CLINICAL DATA:  STAT READ. eval for NG tube placement for start of small bowel protocol. abd pain. Nausea for several days. EXAM: PORTABLE ABDOMEN - 1 VIEW COMPARISON:  CT 07/10/2017 FINDINGS: Nasogastric tube is in place, tip overlying the  level of stomach. There is persistent significant dilatation of small bowel loops, greater than dilatation of large bowel loops and consistent with small bowel obstruction. Contrast is identified within the colon and urinary bladder following CT earlier. IMPRESSION: Nasogastric tube to the region of the stomach. Small bowel obstruction. Electronically Signed   By: Nolon Nations M.D.   On: 07/10/2017 19:03    Assessment: 62 y.o. BRCA 1-2 negative [facility] woman with lobular breast cancer stage IV at presentation July 2011  (1) status post left mastectomy and sentinel lymph node sampling in July 2011 for a lower inner quadrant T1 N1 M1, stage IV invasive lobular breast cancer, grade 1, strongly estrogen and progesterone receptor-positive, HER2 negative with MIB-1 of 9% and no HER2 amplification,   (2) with multiple sclerotic bone lesions at presentation seen only on CT scan (not on bone scan or PET scan), but  with biopsy-proven metastatic disease to bone; and an elevated CA 27.29 at presentation,   (3) Oncotype recurrence score of 4, predicts a good response to antiestrogens.  (4) Systemic treatment has consisted of             a) tamoxifen with evidence of response but poor tolerance             b) letrozole starting August 2012, discontinued October 2014 per patient              (5) single functioning kidney  (6) status post bilateral salpingo-oophorectomy 01/24/2013, with benign pathology  (7) osteoporosis; the patient refuses bisphosphonate therapy; started [facility] February 2014.               (a) bone density was obtained under the care of Dr. Matthew Saras at [facility], 06/25/2012, showing osteoporosis with a T score -2.6. This was repeated 12/20/2013, showing again osteoporosis with T-scores between -2.4 and -2.8.             (b) the patient refuses zolendronate, denosumab, or other pharmacologic intervention             (c) bone density on 12/30/2015  under Dr. Matthew Saras showing osteoporosis with T scores between -2.7 and -3.3  (8) the patient refuses standard Mammography or tomography; undergoing thermography screening of the right breast.  (9) PET scan 06/05/2015 shows rectal thickening and a presacral mass; biopsy of this area 10/09/2015 confirms metastatic lobular breast cancer, again estrogen receptor positive, HER-2 nonamplified.             (a) pelvic MRI 03/11/2016 confirms stability of disease             (b) chest CT and pelvic MRI 09/02/2016 shows no change in the circumferential rectal thickening or evidence of extension beyond the serosa and chest CT findings             (c) colonoscopy 02/23/2017 showed a very narrow rectal lumen with a few apparently uninvolved centimeters distally              (  d) CT of the abdomen and pelvis 05/22/2017 (after diverting colostomy) shows interval increase in the size of the perirectal soft tissue masses.  (10) started fulvestrant and palbociclib 125 mg/ day [21/7] May 2017             (a) palbociclib dose decreased to 100 mg daily [21/7] with second cycle, started 11/25/2015             (b) palbociclib discontinued 03/20/2017 with evidence of disease progression             (c) last fulvestrant dose 03/24/2017, discontinued with evidence of progression  (11) status post colostomy placement [facility] November 2018  (12) started exemestane 07/05/2017, everolimus to follow  (13) small bowel obstruction:  (a) small bowel series 07/03/2017 shows no obstruction  (b) CT abd/pelvis 07/10/2017 c/w mid-distal SBO  (c) portable abd film 07/11/2017 shows some improvement but persistent SB dilatation   Plan:  Noreene's SBO appears to be resolving with conservative measures and if abd film today correlates likely she will have the ng clamped and diet advanced.  She benefited from discussion w nutritionist yesterday--we will follow up on this as outpatient.  Mirabel continued to  insist on the need for colonoscopy for UC. I reviewed the symptoms of UC and she has none. After much discussion what she really meant was IBS. She tells me she has a history of IBS. We discussed IBS, and she understands this is a functional disorder and colonoscopy is normal in those cases and not indicated.  I also gave her a copy of her tumor markers drawn 07/05/2017 which are markedly elevated.  In short Calandra has been having a hard time accepting her current reality, but she is getting there. She is now eager to start the everolimus (in addition to exemestane) and we will try to obtain that for her so she can start as soon as SBO resolves.  I offered to move up her appt with me (2/15) but they are planning a trip to [facility] and won't be back until 02/08  Greatly appreciate your help to this patient!   Bobetta Lime, MD 07/12/2017  7:41 AM Medical Oncology and Hematology [facility] [address] [place], [place] [address] Tel. [phone]    Fax. [phone]

## 2017-07-12 NOTE — Progress Notes (Addendum)
Patient Demographics:    Sonia Hill, is a 62 y.o. female, DOB - July 01, 1955, VZC:[id number]  Admit date - 07/10/2017   Admitting Physician Florencia Reasons, MD  Outpatient Primary MD for the patient is [name], MD  LOS - 2   Chief Complaint  Patient presents with  . Abdominal Pain        Subjective:    Sonia Hill today has no fevers, no emesis,  No chest pain, would like to try liquid diet  Assessment  & Plan :    Active Problems:   SBO (small bowel obstruction) (HCC)   Protein-calorie malnutrition, severe   1)Recurrent sbo in the setting  of breast cancer with GI mets-clinically and radiologically improving, patient had pasty, brown stool in her colostomy bag, as per surgical service okay to clamp NG tube try clear liquid diet for now, will try to keep K>4, mag>2  2)Metastatic breast cancer with bony and rectal/Gi mets s/p colostomy- Oncology Dr [name] following, further chemotherapy is planned as outpatient once patient recovers  3)Right-sided hydroureteronephrosis relatively stable when compared to prior studyon CT scan- She denies flank pain, cr wnl.  History of prior Lt nephrectomy, Curvilinear radiodensity in the right pelvis possibly a fractured/retained catheter tip.Incidental finding on CT ab/pel.   4) moderate to Severe protein caloric malnutrition:Body mass index is 18.14 kg/m.,  Nutritional supplements advised dietitian consult appreciated  5)Hypoglycemia-blood glucose was down to 39, patient has been n.p.o. with NG tube in place, received D50 infusion, okay to start with D5 infusion after the current D10 infusion is gone, serial glucose checks  DVT prophylaxis: Lovenox  Code Status: full  Family Communication: patient and husband at bedside  Disposition Plan: home once oral intake is reliable  Consultants:  General surgery  oncology  Procedures:  Ng  placement  Antibiotics:  none   Lab Results  Component Value Date   PLT 367 07/12/2017    Inpatient Medications  Scheduled Meds: . enoxaparin (LOVENOX) injection  40 mg Subcutaneous Daily  . pantoprazole (PROTONIX) IV  40 mg Intravenous Q24H   Continuous Infusions: . dextrose 5 % and 0.45% NaCl 100 mL/hr at 07/12/17 1345   PRN Meds:.ALPRAZolam, menthol-cetylpyridinium, morphine injection, ondansetron (ZOFRAN) IV, phenol  Anti-infectives (From admission, onward)   None       Objective:   Vitals:   07/11/17 0905 07/11/17 2132 07/12/17 0536 07/12/17 1353  BP: 120/64 128/72 (!) 149/81 (!) 153/88  Pulse: 88 90 85 (!) 105  Resp: 16 16 16 16   Temp: 98.4 F (36.9 C) 97.7 F (36.5 C)    TempSrc: Oral Oral    SpO2: 100% 99% 100% 100%  Weight:      Height:        Wt Readings from Last 3 Encounters:  07/10/17 47.3 kg (104 lb 3.2 oz)  07/05/17 43.9 kg (96 lb 11.2 oz)  06/22/17 46.4 kg (102 lb 4.8 oz)     Intake/Output Summary (Last 24 hours) at 07/12/2017 1659 Last data filed at 07/12/2017 1000 Gross per 24 hour  Intake 2030 ml  Output 2900 ml  Net -870 ml    Physical Exam  Gen:- Awake Alert, cachectic appearing, in no apparent distress  HEENT:- [place].AT, No sclera icterus Nose-NG tube  clamped Neck-Supple Neck,No JVD,.  Lungs-  CTAB  CV- S1, S2 normal Abd-  +ve B.Sounds, Abd Soft, No tenderness,    Extremity/Skin:- No  edema,    Psych-appropriate affect   Data Review:   Micro Results No results found for this or any previous visit (from the past 240 hour(s)).  Radiology Reports Dg Abd 1 View  Result Date: 07/12/2017 CLINICAL DATA:  Small bowel obstruction. EXAM: ABDOMEN - 1 VIEW COMPARISON:  Radiograph of July 11, 2017. FINDINGS: Distal tip of nasogastric tube is seen in the stomach. No colonic dilatation is noted. Dilated small bowel loops are again noted concerning for distal small bowel obstruction. Contrast is noted in the large bowel. IMPRESSION:  Stable dilated small bowel loops are noted concerning for distal small bowel obstruction. Electronically Signed   By: Marijo Conception, M.D.   On: 07/12/2017 08:13   Ct Abdomen Pelvis W Contrast  Result Date: 07/10/2017 CLINICAL DATA:  History of breast cancer and rectal cancer. Abdominal pain and bloating. EXAM: CT ABDOMEN AND PELVIS WITH CONTRAST TECHNIQUE: Multidetector CT imaging of the abdomen and pelvis was performed using the standard protocol following bolus administration of intravenous contrast. CONTRAST:  80 cc  ISOVUE-300 IOPAMIDOL (ISOVUE-300) INJECTION 61% COMPARISON:  CT scan 05/22/2017 FINDINGS: Lower chest: The lung bases are clear. The heart is normal in size. No pericardial effusion. Hepatobiliary: No focal hepatic lesions to suggest metastatic disease. The gallbladder appears normal. No common bile duct dilatation. Pancreas: No mass, inflammation or ductal dilatation. Spleen: Normal size.  No focal lesions. Adrenals/Urinary Tract: Adrenal glands are normal. The left kidney is surgically absent. The right kidney demonstrates compensatory hypertrophy and moderate right-sided hydroureteronephrosis. Stomach/Bowel: The stomach, duodenum and small bowel are dilated and demonstrates scattered air-fluid levels. There is a transition to decompressed small bowel loops in the right lower quadrant. Findings consistent with a mid distal small bowel obstruction. Contrast in the colon is likely from the previous small bowel follow-through from 07/03/2017. Stable left lower quadrant colostomy. Large infiltrating lower rectal mass which appears to involve the anus, presacral soft tissues and the uterus. Vascular/Lymphatic: The major vascular structures are patent. Small scattered mesenteric and retroperitoneal lymph nodes are stable. Reproductive: The uterus appears to be involved with rectal tumor. Other: There is a curvilinear radiodensity in the right pelvis which was not present on the prior study. It may  be a fractured catheter tip or prior ureteral stent. Recommend correlation with clinical history. Musculoskeletal: No significant bony findings. IMPRESSION: 1. Large fungating/infiltrating rectal mass, relatively stable. 2. Mid distal small bowel obstruction. 3. Right-sided hydroureteronephrosis relatively stable when compared to prior study. 4. Curvilinear radiodensity in the right pelvis possibly a fractured/retained catheter tip. Electronically Signed   By: Marijo Sanes M.D.   On: 07/10/2017 16:21   Dg Small Bowel  Result Date: 07/03/2017 CLINICAL DATA:  Metastatic breast cancer with rectal metastases. Diverting colostomy. Small bowel obstruction. EXAM: SMALL BOWEL SERIES COMPARISON:  CT 05/22/2017.  Radiographs 06/19/2017. TECHNIQUE: Following ingestion of thin barium, serial small bowel images were obtained including spot views of the terminal ileum. FLUOROSCOPY TIME:  Fluoroscopy Time: 1 min and 6 sec of low-dose pulsed fluoro Radiation Exposure Index (if provided by the fluoroscopic device): 60 mGy Number of Acquired Spot Images: 0 FINDINGS: The scout abdominal radiograph demonstrates improvement in the previously demonstrated small bowel dilatation. No significant dilated loops of small bowel are seen. There is stool in the right colon. Left-sided colostomy noted. The patient drank the water-soluble  contrast without difficulty. Overhead abdominal radiographs were obtained at 10 and 30 min supplemented by fluoroscopic observation and spot images. The stomach and proximal small bowel appear normal. Contrast reaches the colon within 30 min of initial ingestion. The small bowel appears foreshortened, suggesting previous partial small bowel resection. There are few mildly dilated loops of small bowel in the right mid abdomen. At fluoroscopy, these are not fixed. There prominent small bowel peristalsis at fluoroscopy, recorded on series 3 and series 5. The terminal ileum was difficult to isolate, although  appears grossly normal. No contrast extravasation. IMPRESSION: 1. Rapid transit of contrast through the small bowel, reaching the colon within 30 min of initial ingestion. No evidence of bowel obstruction. 2. There are few transiently dilated loops of small bowel and prominent peristalsis at fluoroscopy. Electronically Signed   By: Richardean Sale M.D.   On: 07/03/2017 12:37   Dg Abd 2 Views  Result Date: 07/10/2017 CLINICAL DATA:  Bloating. Abdominal pain and nausea. Colostomy in November. Metastatic breast cancer. EXAM: ABDOMEN - 2 VIEW COMPARISON:  07/03/2017 small bowel follow-through. FINDINGS: Upright and supine views. The upright view demonstrates no free intraperitoneal air. Surgical clips about the left lower chest. Air-fluid levels which could be within large or small bowel loops. Supine image demonstrates gaseous distension of small bowel including at 5.1 cm. IMPRESSION: Development of moderate small bowel dilatation with bowel air-fluid levels. Suspicious for distal small bowel or colonic obstruction. Adynamic ileus is felt less likely. No free intraperitoneal air or other acute complication. Electronically Signed   By: Abigail Miyamoto M.D.   On: 07/10/2017 10:51   Dg Abd 2 Views  Result Date: 06/19/2017 CLINICAL DATA:  Abnormal findings on examination of gastrointestinal tract. Bloating with pain. History of colostomy. EXAM: ABDOMEN - 2 VIEW COMPARISON:  CT 05/22/2017 FINDINGS: Dilated loops of small bowel with air-fluid levels. Large amount of stool in the right colon. There is a colostomy in the left lower abdomen. No evidence for free air on the upright view. Multiple surgical clips in the left lower chest and prior left mastectomy. IMPRESSION: Dilated loops of small bowel are suggestive for a small bowel obstruction. These results will be called to the ordering clinician or representative by the Radiology Department at the imaging location. Electronically Signed   By: Markus Daft M.D.   On:  06/19/2017 13:28   Dg Abd Portable 1v-small Bowel Obstruction Protocol-initial, 8 Hr Delay  Result Date: 07/11/2017 CLINICAL DATA:  Small bowel obstruction. EXAM: PORTABLE ABDOMEN - 1 VIEW COMPARISON:  07/10/2017 FINDINGS: There is a nasogastric tube with the tip projecting over the body of the stomach. There is persistent small bowel dilatation measuring up to 4 cm. There is oral contrast material which has transited into the colon. There is no evidence of pneumoperitoneum, portal venous gas or pneumatosis. There are no pathologic calcifications along the expected course of the ureters. The osseous structures are unremarkable. IMPRESSION: 1. Nasogastric tube with the tip projecting over the body of the stomach. 2. Small bowel dilatation measuring up to 4 cm with oral contrast material which has transited into the colon. Findings are suggestive of a partial small bowel obstruction. Electronically Signed   By: Kathreen Devoid   On: 07/11/2017 08:14   Dg Abd Portable 1v-small Bowel Protocol-position Verification  Result Date: 07/10/2017 CLINICAL DATA:  STAT READ. eval for NG tube placement for start of small bowel protocol. abd pain. Nausea for several days. EXAM: PORTABLE ABDOMEN - 1 VIEW COMPARISON:  CT 07/10/2017 FINDINGS: Nasogastric tube is in place, tip overlying the level of stomach. There is persistent significant dilatation of small bowel loops, greater than dilatation of large bowel loops and consistent with small bowel obstruction. Contrast is identified within the colon and urinary bladder following CT earlier. IMPRESSION: Nasogastric tube to the region of the stomach. Small bowel obstruction. Electronically Signed   By: Nolon Nations M.D.   On: 07/10/2017 19:03     CBC Recent Labs  Lab 07/10/17 1314 07/11/17 0437 07/12/17 0433  WBC 8.6 9.0 8.8  HGB 12.5 12.1 12.9  HCT 34.5* 33.7* 37.6  PLT 391 341 367  MCV 85.2 85.8 88.3  MCH 30.9 30.8 30.3  MCHC 36.2* 35.9 34.3  RDW 12.1 12.1 12.5   LYMPHSABS 1.0 0.7 0.8  MONOABS 0.7 0.7 0.5  EOSABS 0.0 0.1 0.0  BASOSABS 0.0 0.0 0.0    Chemistries  Recent Labs  Lab 07/10/17 1314 07/11/17 0437 07/12/17 0433 07/12/17 1325  NA 125* 132* 138 132*  K 3.7 4.3 3.5 3.4*  CL 89* 100* 103 99*  CO2 27 22 20* 24  GLUCOSE 87 69 50* 186*  BUN 9 8 10 7   CREATININE 0.68 0.63 0.73 0.57  CALCIUM 9.4 9.1 9.4 9.3  MG  --  2.7*  --   --   AST 26  --   --   --   ALT 15  --   --   --   ALKPHOS 70  --   --   --   BILITOT 0.5  --   --   --    ------------------------------------------------------------------------------------------------------------------ No results for input(s): CHOL, HDL, LDLCALC, TRIG, CHOLHDL, LDLDIRECT in the last 72 hours.  No results found for: HGBA1C ------------------------------------------------------------------------------------------------------------------ No results for input(s): TSH, T4TOTAL, T3FREE, THYROIDAB in the last 72 hours.  Invalid input(s): FREET3 ------------------------------------------------------------------------------------------------------------------ No results for input(s): VITAMINB12, FOLATE, FERRITIN, TIBC, IRON, RETICCTPCT in the last 72 hours.  Coagulation profile No results for input(s): INR, PROTIME in the last 168 hours.  No results for input(s): DDIMER in the last 72 hours.  Cardiac Enzymes No results for input(s): CKMB, TROPONINI, MYOGLOBIN in the last 168 hours.  Invalid input(s): CK ------------------------------------------------------------------------------------------------------------------ No results found for: BNP   Roxan Hockey M.D on 07/12/2017 at 4:59 PM  Between 7am to 7pm - Pager - [phone]  After 7pm go to [email or web address] - password [email or web address]  [facility] -  Office  [phone]  Voice Recognition Viviann Spare dictation system was used to create this note, attempts have been made to correct errors. Please contact the author with questions and/or  clarifications.

## 2017-07-12 NOTE — Progress Notes (Signed)
[facility] Progress Note     Subjective: CC: SBO Patient having stool output in ostomy bag. Denies abdominal pain. Having some nausea, mostly associated with low blood sugar this AM. Has had >5 cups ice chips overnight. Ambulated in the hall some yesterday. Patient wanting to know if there are any sort of support groups locally for patients with ostomies.  VSS.   Objective: Vital signs in last 24 hours: Temp:  [97.7 F (36.5 C)-98.4 F (36.9 C)] 97.7 F (36.5 C) (01/29 2132) Pulse Rate:  [85-90] 85 (01/30 0536) Resp:  [16] 16 (01/30 0536) BP: (120-149)/(64-81) 149/81 (01/30 0536) SpO2:  [99 %-100 %] 100 % (01/30 0536)    Intake/Output from previous day: 01/29 0701 - 01/30 0700 In: 2420 [P.O.:60; I.V.:2360] Out: 4075 [Urine:550; Emesis/NG output:3350; Stool:175] Intake/Output this shift: No intake/output data recorded.  PE: Gen:  Alert, NAD, pleasant Card:  Regular rate and rhythm, pedal pulses 2+ BL Pulm:  Normal effort, clear to auscultation bilaterally Abd: Soft, non-tender, non-distended, bowel sounds present, no HSM, stoma pink with stool output in ostomy bag Skin: warm and dry, no rashes  Psych: A&Ox3   Lab Results:  Recent Labs    07/11/17 0437 07/12/17 0433  WBC 9.0 8.8  HGB 12.1 12.9  HCT 33.7* 37.6  PLT 341 367   BMET Recent Labs    07/11/17 0437 07/12/17 0433  NA 132* 138  K 4.3 3.5  CL 100* 103  CO2 22 20*  GLUCOSE 69 50*  BUN 8 10  CREATININE 0.63 0.73  CALCIUM 9.1 9.4   PT/INR No results for input(s): LABPROT, INR in the last 72 hours. CMP     Component Value Date/Time   NA 138 07/12/2017 0433   NA 134 (L) 12/28/2016 1503   K 3.5 07/12/2017 0433   K 4.0 12/28/2016 1503   CL 103 07/12/2017 0433   CL 98 12/03/2012 1419   CO2 20 (L) 07/12/2017 0433   CO2 25 12/28/2016 1503   GLUCOSE 50 (L) 07/12/2017 0433   GLUCOSE 96 12/28/2016 1503   GLUCOSE 89 12/03/2012 1419   BUN 10 07/12/2017 0433   BUN 12.0 12/28/2016 1503    CREATININE 0.73 07/12/2017 0433   CREATININE 0.8 12/28/2016 1503   CALCIUM 9.4 07/12/2017 0433   CALCIUM 9.7 12/28/2016 1503   PROT 6.3 (L) 07/10/2017 1314   PROT 6.5 12/28/2016 1503   ALBUMIN 3.7 07/10/2017 1314   ALBUMIN 3.9 12/28/2016 1503   AST 26 07/10/2017 1314   AST 22 12/28/2016 1503   ALT 15 07/10/2017 1314   ALT 20 12/28/2016 1503   ALKPHOS 70 07/10/2017 1314   ALKPHOS 76 12/28/2016 1503   BILITOT 0.5 07/10/2017 1314   BILITOT 0.50 12/28/2016 1503   GFRNONAA >60 07/12/2017 0433   GFRAA >60 07/12/2017 0433   Lipase     Component Value Date/Time   LIPASE 45 07/10/2017 1314       Studies/Results: Ct Abdomen Pelvis W Contrast  Result Date: 07/10/2017 CLINICAL DATA:  History of breast cancer and rectal cancer. Abdominal pain and bloating. EXAM: CT ABDOMEN AND PELVIS WITH CONTRAST TECHNIQUE: Multidetector CT imaging of the abdomen and pelvis was performed using the standard protocol following bolus administration of intravenous contrast. CONTRAST:  80 cc  ISOVUE-300 IOPAMIDOL (ISOVUE-300) INJECTION 61% COMPARISON:  CT scan 05/22/2017 FINDINGS: Lower chest: The lung bases are clear. The heart is normal in size. No pericardial effusion. Hepatobiliary: No focal hepatic lesions to suggest metastatic disease. The gallbladder  appears normal. No common bile duct dilatation. Pancreas: No mass, inflammation or ductal dilatation. Spleen: Normal size.  No focal lesions. Adrenals/Urinary Tract: Adrenal glands are normal. The left kidney is surgically absent. The right kidney demonstrates compensatory hypertrophy and moderate right-sided hydroureteronephrosis. Stomach/Bowel: The stomach, duodenum and small bowel are dilated and demonstrates scattered air-fluid levels. There is a transition to decompressed small bowel loops in the right lower quadrant. Findings consistent with a mid distal small bowel obstruction. Contrast in the colon is likely from the previous small bowel follow-through from  07/03/2017. Stable left lower quadrant colostomy. Large infiltrating lower rectal mass which appears to involve the anus, presacral soft tissues and the uterus. Vascular/Lymphatic: The major vascular structures are patent. Small scattered mesenteric and retroperitoneal lymph nodes are stable. Reproductive: The uterus appears to be involved with rectal tumor. Other: There is a curvilinear radiodensity in the right pelvis which was not present on the prior study. It may be a fractured catheter tip or prior ureteral stent. Recommend correlation with clinical history. Musculoskeletal: No significant bony findings. IMPRESSION: 1. Large fungating/infiltrating rectal mass, relatively stable. 2. Mid distal small bowel obstruction. 3. Right-sided hydroureteronephrosis relatively stable when compared to prior study. 4. Curvilinear radiodensity in the right pelvis possibly a fractured/retained catheter tip. Electronically Signed   By: Marijo Sanes M.D.   On: 07/10/2017 16:21   Dg Abd 2 Views  Result Date: 07/10/2017 CLINICAL DATA:  Bloating. Abdominal pain and nausea. Colostomy in November. Metastatic breast cancer. EXAM: ABDOMEN - 2 VIEW COMPARISON:  07/03/2017 small bowel follow-through. FINDINGS: Upright and supine views. The upright view demonstrates no free intraperitoneal air. Surgical clips about the left lower chest. Air-fluid levels which could be within large or small bowel loops. Supine image demonstrates gaseous distension of small bowel including at 5.1 cm. IMPRESSION: Development of moderate small bowel dilatation with bowel air-fluid levels. Suspicious for distal small bowel or colonic obstruction. Adynamic ileus is felt less likely. No free intraperitoneal air or other acute complication. Electronically Signed   By: Abigail Miyamoto M.D.   On: 07/10/2017 10:51   Dg Abd Portable 1v-small Bowel Obstruction Protocol-initial, 8 Hr Delay  Result Date: 07/11/2017 CLINICAL DATA:  Small bowel obstruction. EXAM:  PORTABLE ABDOMEN - 1 VIEW COMPARISON:  07/10/2017 FINDINGS: There is a nasogastric tube with the tip projecting over the body of the stomach. There is persistent small bowel dilatation measuring up to 4 cm. There is oral contrast material which has transited into the colon. There is no evidence of pneumoperitoneum, portal venous gas or pneumatosis. There are no pathologic calcifications along the expected course of the ureters. The osseous structures are unremarkable. IMPRESSION: 1. Nasogastric tube with the tip projecting over the body of the stomach. 2. Small bowel dilatation measuring up to 4 cm with oral contrast material which has transited into the colon. Findings are suggestive of a partial small bowel obstruction. Electronically Signed   By: Kathreen Devoid   On: 07/11/2017 08:14   Dg Abd Portable 1v-small Bowel Protocol-position Verification  Result Date: 07/10/2017 CLINICAL DATA:  STAT READ. eval for NG tube placement for start of small bowel protocol. abd pain. Nausea for several days. EXAM: PORTABLE ABDOMEN - 1 VIEW COMPARISON:  CT 07/10/2017 FINDINGS: Nasogastric tube is in place, tip overlying the level of stomach. There is persistent significant dilatation of small bowel loops, greater than dilatation of large bowel loops and consistent with small bowel obstruction. Contrast is identified within the colon and urinary bladder following CT  earlier. IMPRESSION: Nasogastric tube to the region of the stomach. Small bowel obstruction. Electronically Signed   By: Nolon Nations M.D.   On: 07/10/2017 19:03    Anti-infectives: Anti-infectives (From admission, onward)   None       Assessment/Plan Metastatic breast cancer with bone and rectal metastasis Possible uterine involvement with rectal tumor Ongoing weight loss in the setting of chronic illness Surgically absent left kidney - age 62 R hydroureteronephrosis - stable Hypothyroidism  S/P transverse loop colostomy - 04/2017 [place] SBO - KUB this AM with contrast in colon - repeat KUB in AM - >3L out of NGT in 24h, although patient taking in large amounts ice chips - 175 cc stool output with 1 occurrence recorded as well - clamp NGT and trial of sips of clears - consult to [place] - mobilize Hypokalemia - K 3.5, replace today  Hypoglycemia - glucose 50 this AM, give D10 bolus and continue to monitor  FEN: NPO w/ ice chips and sips of clears, IVF; NGT clamping; replace K, D10 bolus  VTE: SCDs ID: no current abx, afebrile, WBC 8.8  LOS: 2 days    Brigid Re , [facility] 07/12/2017, 8:00 AM Pager: [phone] Consults: [phone] Mon-Fri 7:00 am-4:30 pm Sat-Sun 7:00 am-11:30 am

## 2017-07-12 NOTE — Telephone Encounter (Signed)
Oral Oncology Pharmacist Encounter  Received request from MD to follow-up on Afinitor prescription status from [facility].  I called [facility] at [phone]  Per the representative, they called patient on 07/10/17 to schedule delivery of Afinitor. No answer, so they LVM.  I called an updated patient's husband and provided phone number to call [facility] to schedule delivery. I instructed patient's husband to let the office know when Sonia starts the Afinitor.  [name] know to call the office with additional questions or concerns.  Oral Oncology Clinic will continue to follow.  Johny Drilling, PharmD, BCPS, BCOP 07/12/2017 8:57 AM Oral Oncology Clinic [phone]

## 2017-07-12 NOTE — Consult Note (Signed)
[facility] Nurse ostomy consult note Stoma type/location: LLQ Colostomy. Patient and spouse are independent in ostomy care.  They were discharged from [facility] in November where they were instructed by an ostomy nurse (2 visits).  They were then discharged home to [facility] where [facility] support continued for 5 weeks for pouch change instruction and support/reinforcement of education.  Stomal assessment/size: 1 and 1/8 inch round, raised, red, moist with os at center. They have experienced prolapse, but that is not present today. Mild herniation noted. According to digital exam by Will Creig Hines earlier today, stricture noted. Peristomal assessment: Not seen today Treatment options for stomal/peristomal skin: Skin barrier ring Output: soft brown stool Ostomy pouching: 2pc. 2 and 1/4 inch pouching system with skin barrier ring Education provided: Patient described difficulty in obtaining ostomy supplies due to not having prescriptions.  Please provide patient with prescriptions for ostomy supplies below: It is very helpful if you indicate no substitutions on the prescriptions.  2-piece 2 and 1/4 inch ostomy pouching system by [employer]: -Opaque Pouches with gas filter -CeraPlus Skin Barriers -CeraRings (standard)  Enrolled patient in [place] program: Yes, previously  Patient provided with pouching supplies for 3 weeks (6 set-ups) to bedside. We also discuss an Ostomy Support Group near her home [place], [place]).  Flyer provided for the [employer] associated with the [place] which meets once monthly on Wednesday evenings at [facility]. Patient, spouse, sister and 2 friends express gratitude for support, supplies and information today.  [place] nursing team will follow intermittently while in house, and will remain available to this patient, the nursing, surgical and medical teams.  Please re-consult if needed in between  visits.  Thank you for inviting me to be a part of this patient's care team. Maudie Flakes, MSN, RN, [place], Arther Abbott  Pager# [phone]

## 2017-07-13 ENCOUNTER — Inpatient Hospital Stay (HOSPITAL_COMMUNITY): Payer: 59

## 2017-07-13 DIAGNOSIS — R978 Other abnormal tumor markers: Secondary | ICD-10-CM

## 2017-07-13 DIAGNOSIS — E162 Hypoglycemia, unspecified: Secondary | ICD-10-CM

## 2017-07-13 DIAGNOSIS — N1330 Unspecified hydronephrosis: Secondary | ICD-10-CM

## 2017-07-13 LAB — CBC
HEMATOCRIT: 33.9 % — AB (ref 36.0–46.0)
Hemoglobin: 12 g/dL (ref 12.0–15.0)
MCH: 30.2 pg (ref 26.0–34.0)
MCHC: 35.4 g/dL (ref 30.0–36.0)
MCV: 85.2 fL (ref 78.0–100.0)
Platelets: 339 10*3/uL (ref 150–400)
RBC: 3.98 MIL/uL (ref 3.87–5.11)
RDW: 12.3 % (ref 11.5–15.5)
WBC: 8.8 10*3/uL (ref 4.0–10.5)

## 2017-07-13 LAB — GLUCOSE, CAPILLARY
GLUCOSE-CAPILLARY: 123 mg/dL — AB (ref 65–99)
Glucose-Capillary: 85 mg/dL (ref 65–99)
Glucose-Capillary: 106 mg/dL — ABNORMAL HIGH (ref 65–99)

## 2017-07-13 LAB — BASIC METABOLIC PANEL
Anion gap: 5 (ref 5–15)
BUN: <5 mg/dL — ABNORMAL LOW (ref 6–20)
CHLORIDE: 101 mmol/L (ref 101–111)
CO2: 25 mmol/L (ref 22–32)
Calcium: 9.4 mg/dL (ref 8.9–10.3)
Creatinine, Ser: 0.64 mg/dL (ref 0.44–1.00)
GFR calc Af Amer: >60 mL/min (ref >60)
GFR calc non Af Amer: >60 mL/min (ref >60)
Glucose, Bld: 94 mg/dL (ref 65–99)
POTASSIUM: 3.6 mmol/L (ref 3.5–5.1)
SODIUM: 131 mmol/L — AB (ref 135–145)

## 2017-07-13 NOTE — Progress Notes (Signed)
CC: Nausea vomiting and abdominal pain.  Subjective: She looks much better than she did 2 days ago.  She feels much more comfortable today than she did before also.  She is tolerating clears.  She says she is having some stool and gas from her ostomy.  Currently ostomy is pretty empty.  She is anxious to go forward with full liquids and they have a smoothie from home for her.  Objective: Vital signs in last 24 hours: Temp:  [98.4 F (36.9 C)-98.6 F (37 C)] 98.4 F (36.9 C) (01/31 0615) Pulse Rate:  [87-105] 87 (01/31 0615) Resp:  [16] 16 (01/31 0615) BP: (141-153)/(86-88) 145/88 (01/31 0615) SpO2:  [99 %-100 %] 99 % (01/31 0615)  760 PO 3700 IV Urine x 5 Stool x 2 Afebrile, VSS, BP up some Na 131 K+ up to 3.6 WBC is normal, H/H stable ABX:  Stable enteric tube, side hole up the level of the proximal stomach. Stable and negative visible lung bases. Gas-filled small bowel loops remain dilated between 30-40 centimeters diameter throughout much of the abdomen. Decreased volume of retained contrast in the proximal colon. Left abdominal ostomy redemonstrated. Stable visualized osseous structures. IMPRESSION: 1. Stable enteric tube. 2. No significant improvement in partial small bowel obstruction pattern since 07/10/2017. 3. Decreasing volume of retained oral contrast in the proximal Colon.   Intake/Output from previous day: 01/30 0701 - 01/31 0700 In: 4448.3 [P.O.:760; I.V.:3688.3] Out: 0  Intake/Output this shift: Total I/O In: -  Out: 400 [Urine:400]  General appearance: alert, cooperative and no distress Resp: clear to auscultation bilaterally GI: Soft, minimal distention.  No abdominal pain this a.m.  Her ostomy she reports is functioning with some stool and gas.  Currently the bag is empty.  Tolerating clears.  Lab Results:  Recent Labs    07/12/17 0433 07/13/17 0535  WBC 8.8 8.8  HGB 12.9 12.0  HCT 37.6 33.9*  PLT 367 339    BMET Recent Labs   07/12/17 1325 07/13/17 0535  NA 132* 131*  K 3.4* 3.6  CL 99* 101  CO2 24 25  GLUCOSE 186* 94  BUN 7 <5*  CREATININE 0.57 0.64  CALCIUM 9.3 9.4   PT/INR No results for input(s): LABPROT, INR in the last 72 hours.  Recent Labs  Lab 07/10/17 1314  AST 26  ALT 15  ALKPHOS 70  BILITOT 0.5  PROT 6.3*  ALBUMIN 3.7     Lipase     Component Value Date/Time   LIPASE 45 07/10/2017 1314     Medications: . enoxaparin (LOVENOX) injection  40 mg Subcutaneous Daily  . 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 Donne Hazel reviews the film before pulling the NG.  Recheck film in AM.  Recheck BMP in AM.        LOS: 3 days    Sonia Hill 07/13/2017 [phone]

## 2017-07-13 NOTE — Progress Notes (Signed)
Patient Demographics:    Sonia Hill, is a 62 y.o. female, DOB - 1955-12-27, QIH:[id number]  Admit date - 07/10/2017   Admitting Physician Florencia Reasons, MD  Outpatient Primary MD for the patient is [name], MD  LOS - 3  Chief Complaint  Patient presents with  . Abdominal Pain        Subjective:    [name] today has no fevers, no emesis,  No chest pain, sister and husband at bedside, tolerated NG clamping well, tolerating clear liquids  Assessment  & Plan :    Active Problems:   SBO (small bowel obstruction) (HCC)   Protein-calorie malnutrition, severe   1)Recurrent sbo in the setting  of breast cancer with GI mets-clinically and radiologically improving, patient had pasty, brown stool in her colostomy bag, as per surgical service okay to remove NG tube on 07/13/17 and advance diet to full liquids,  will try to keep K>4, mag>2  2)Metastatic breast cancer with bony and rectal/Gi mets s/p colostomy- Oncology Dr [name] following, further chemotherapy is planned as outpatient once patient recovers  3)Right-sided hydroureteronephrosis relatively stable when compared to prior studyon CT scan- She denies flank pain, cr wnl.  History of prior Lt nephrectomy, Curvilinear radiodensity in the right pelvis possibly a fractured/retained catheter tip.Incidental finding on CT ab/pel.   4)Moderate to Severe Protein caloric malnutrition: Body mass index is 18.14 kg/m.,  Nutritional supplements advised, dietitian consult appreciated  5)Hypoglycemia-no further hypoglycemic episodes, may decrease dextrose infusion now that patient has been advanced to full liquid diet by surgical team    DVT prophylaxis: Lovenox  Code Status: full  Family Communication: patient and husband at bedside  Disposition Plan: home once oral intake is reliable  Consultants:  General  surgery  oncology  Procedures:  Ng placement (removed 07/13/17)  Antibiotics:  none   Lab Results  Component Value Date   PLT 339 07/13/2017    Inpatient Medications  Scheduled Meds: . enoxaparin (LOVENOX) injection  40 mg Subcutaneous Daily  . pantoprazole (PROTONIX) IV  40 mg Intravenous Q24H   Continuous Infusions: . dextrose 5 % and 0.45% NaCl 100 mL/hr at 07/12/17 2131   PRN Meds:.ALPRAZolam, menthol-cetylpyridinium, morphine injection, ondansetron (ZOFRAN) IV, phenol  Anti-infectives (From admission, onward)   None       Objective:   Vitals:   07/12/17 0536 07/12/17 1353 07/12/17 2149 07/13/17 0615  BP: (!) 149/81 (!) 153/88 (!) 141/86 (!) 145/88  Pulse: 85 (!) 105 (!) 103 87  Resp: 16 16 16 16   Temp:   98.6 F (37 C) 98.4 F (36.9 C)  TempSrc:   Oral Oral  SpO2: 100% 100% 100% 99%  Weight:      Height:        Wt Readings from Last 3 Encounters:  07/10/17 47.3 kg (104 lb 3.2 oz)  07/05/17 43.9 kg (96 lb 11.2 oz)  06/22/17 46.4 kg (102 lb 4.8 oz)     Intake/Output Summary (Last 24 hours) at 07/13/2017 1637 Last data filed at 07/13/2017 0904 Gross per 24 hour  Intake 3488.34 ml  Output 400 ml  Net 3088.34 ml    Physical Exam  Gen:- Awake Alert, cachectic appearing, in no apparent distress  HEENT:- [place].AT, No sclera icterus  Nose-NG tube removed Neck-Supple Neck,No JVD,.  Lungs-  CTAB  CV- S1, S2 normal Abd-  +ve B.Sounds, Abd Soft, No tenderness, colostomy bag with liquid/semisolid contents Extremity/Skin:- No  edema,    Psych-appropriate affect   Data Review:   Micro Results No results found for this or any previous visit (from the past 240 hour(s)).  Radiology Reports Dg Abd 1 View  Result Date: 07/12/2017 CLINICAL DATA:  Small bowel obstruction. EXAM: ABDOMEN - 1 VIEW COMPARISON:  Radiograph of July 11, 2017. FINDINGS: Distal tip of nasogastric tube is seen in the stomach. No colonic dilatation is noted. Dilated small bowel  loops are again noted concerning for distal small bowel obstruction. Contrast is noted in the large bowel. IMPRESSION: Stable dilated small bowel loops are noted concerning for distal small bowel obstruction. Electronically Signed   By: Marijo Conception, M.D.   On: 07/12/2017 08:13   Ct Abdomen Pelvis W Contrast  Result Date: 07/10/2017 CLINICAL DATA:  History of breast cancer and rectal cancer. Abdominal pain and bloating. EXAM: CT ABDOMEN AND PELVIS WITH CONTRAST TECHNIQUE: Multidetector CT imaging of the abdomen and pelvis was performed using the standard protocol following bolus administration of intravenous contrast. CONTRAST:  80 cc  ISOVUE-300 IOPAMIDOL (ISOVUE-300) INJECTION 61% COMPARISON:  CT scan 05/22/2017 FINDINGS: Lower chest: The lung bases are clear. The heart is normal in size. No pericardial effusion. Hepatobiliary: No focal hepatic lesions to suggest metastatic disease. The gallbladder appears normal. No common bile duct dilatation. Pancreas: No mass, inflammation or ductal dilatation. Spleen: Normal size.  No focal lesions. Adrenals/Urinary Tract: Adrenal glands are normal. The left kidney is surgically absent. The right kidney demonstrates compensatory hypertrophy and moderate right-sided hydroureteronephrosis. Stomach/Bowel: The stomach, duodenum and small bowel are dilated and demonstrates scattered air-fluid levels. There is a transition to decompressed small bowel loops in the right lower quadrant. Findings consistent with a mid distal small bowel obstruction. Contrast in the colon is likely from the previous small bowel follow-through from 07/03/2017. Stable left lower quadrant colostomy. Large infiltrating lower rectal mass which appears to involve the anus, presacral soft tissues and the uterus. Vascular/Lymphatic: The major vascular structures are patent. Small scattered mesenteric and retroperitoneal lymph nodes are stable. Reproductive: The uterus appears to be involved with rectal  tumor. Other: There is a curvilinear radiodensity in the right pelvis which was not present on the prior study. It may be a fractured catheter tip or prior ureteral stent. Recommend correlation with clinical history. Musculoskeletal: No significant bony findings. IMPRESSION: 1. Large fungating/infiltrating rectal mass, relatively stable. 2. Mid distal small bowel obstruction. 3. Right-sided hydroureteronephrosis relatively stable when compared to prior study. 4. Curvilinear radiodensity in the right pelvis possibly a fractured/retained catheter tip. Electronically Signed   By: Marijo Sanes M.D.   On: 07/10/2017 16:21   Dg Small Bowel  Result Date: 07/03/2017 CLINICAL DATA:  Metastatic breast cancer with rectal metastases. Diverting colostomy. Small bowel obstruction. EXAM: SMALL BOWEL SERIES COMPARISON:  CT 05/22/2017.  Radiographs 06/19/2017. TECHNIQUE: Following ingestion of thin barium, serial small bowel images were obtained including spot views of the terminal ileum. FLUOROSCOPY TIME:  Fluoroscopy Time: 1 min and 6 sec of low-dose pulsed fluoro Radiation Exposure Index (if provided by the fluoroscopic device): 60 mGy Number of Acquired Spot Images: 0 FINDINGS: The scout abdominal radiograph demonstrates improvement in the previously demonstrated small bowel dilatation. No significant dilated loops of small bowel are seen. There is stool in the right colon. Left-sided colostomy noted. The  patient drank the water-soluble contrast without difficulty. Overhead abdominal radiographs were obtained at 10 and 30 min supplemented by fluoroscopic observation and spot images. The stomach and proximal small bowel appear normal. Contrast reaches the colon within 30 min of initial ingestion. The small bowel appears foreshortened, suggesting previous partial small bowel resection. There are few mildly dilated loops of small bowel in the right mid abdomen. At fluoroscopy, these are not fixed. There prominent small bowel  peristalsis at fluoroscopy, recorded on series 3 and series 5. The terminal ileum was difficult to isolate, although appears grossly normal. No contrast extravasation. IMPRESSION: 1. Rapid transit of contrast through the small bowel, reaching the colon within 30 min of initial ingestion. No evidence of bowel obstruction. 2. There are few transiently dilated loops of small bowel and prominent peristalsis at fluoroscopy. Electronically Signed   By: Richardean Sale M.D.   On: 07/03/2017 12:37   Dg Abd 2 Views  Result Date: 07/10/2017 CLINICAL DATA:  Bloating. Abdominal pain and nausea. Colostomy in November. Metastatic breast cancer. EXAM: ABDOMEN - 2 VIEW COMPARISON:  07/03/2017 small bowel follow-through. FINDINGS: Upright and supine views. The upright view demonstrates no free intraperitoneal air. Surgical clips about the left lower chest. Air-fluid levels which could be within large or small bowel loops. Supine image demonstrates gaseous distension of small bowel including at 5.1 cm. IMPRESSION: Development of moderate small bowel dilatation with bowel air-fluid levels. Suspicious for distal small bowel or colonic obstruction. Adynamic ileus is felt less likely. No free intraperitoneal air or other acute complication. Electronically Signed   By: Abigail Miyamoto M.D.   On: 07/10/2017 10:51   Dg Abd 2 Views  Result Date: 06/19/2017 CLINICAL DATA:  Abnormal findings on examination of gastrointestinal tract. Bloating with pain. History of colostomy. EXAM: ABDOMEN - 2 VIEW COMPARISON:  CT 05/22/2017 FINDINGS: Dilated loops of small bowel with air-fluid levels. Large amount of stool in the right colon. There is a colostomy in the left lower abdomen. No evidence for free air on the upright view. Multiple surgical clips in the left lower chest and prior left mastectomy. IMPRESSION: Dilated loops of small bowel are suggestive for a small bowel obstruction. These results will be called to the ordering clinician or  representative by the Radiology Department at the imaging location. Electronically Signed   By: Markus Daft M.D.   On: 06/19/2017 13:28   Dg Abd Portable 1v  Result Date: 07/13/2017 CLINICAL DATA:  62 year old female with small bowel obstruction. Rectal mass. Colostomy. EXAM: PORTABLE ABDOMEN - 1 VIEW COMPARISON:  07/12/2017 and earlier. FINDINGS: Portable AP supine view at 0446 hr. Stable enteric tube, side hole up the level of the proximal stomach. Stable and negative visible lung bases. Gas-filled small bowel loops remain dilated between 30-40 centimeters diameter throughout much of the abdomen. Decreased volume of retained contrast in the proximal colon. Left abdominal ostomy redemonstrated. Stable visualized osseous structures. IMPRESSION: 1. Stable enteric tube. 2. No significant improvement in partial small bowel obstruction pattern since 07/10/2017. 3. Decreasing volume of retained oral contrast in the proximal colon. Electronically Signed   By: Genevie Ann M.D.   On: 07/13/2017 08:44   Dg Abd Portable 1v-small Bowel Obstruction Protocol-initial, 8 Hr Delay  Result Date: 07/11/2017 CLINICAL DATA:  Small bowel obstruction. EXAM: PORTABLE ABDOMEN - 1 VIEW COMPARISON:  07/10/2017 FINDINGS: There is a nasogastric tube with the tip projecting over the body of the stomach. There is persistent small bowel dilatation measuring up to 4 cm.  There is oral contrast material which has transited into the colon. There is no evidence of pneumoperitoneum, portal venous gas or pneumatosis. There are no pathologic calcifications along the expected course of the ureters. The osseous structures are unremarkable. IMPRESSION: 1. Nasogastric tube with the tip projecting over the body of the stomach. 2. Small bowel dilatation measuring up to 4 cm with oral contrast material which has transited into the colon. Findings are suggestive of a partial small bowel obstruction. Electronically Signed   By: Kathreen Devoid   On: 07/11/2017  08:14   Dg Abd Portable 1v-small Bowel Protocol-position Verification  Result Date: 07/10/2017 CLINICAL DATA:  STAT READ. eval for NG tube placement for start of small bowel protocol. abd pain. Nausea for several days. EXAM: PORTABLE ABDOMEN - 1 VIEW COMPARISON:  CT 07/10/2017 FINDINGS: Nasogastric tube is in place, tip overlying the level of stomach. There is persistent significant dilatation of small bowel loops, greater than dilatation of large bowel loops and consistent with small bowel obstruction. Contrast is identified within the colon and urinary bladder following CT earlier. IMPRESSION: Nasogastric tube to the region of the stomach. Small bowel obstruction. Electronically Signed   By: Nolon Nations M.D.   On: 07/10/2017 19:03     CBC Recent Labs  Lab 07/10/17 1314 07/11/17 0437 07/12/17 0433 07/13/17 0535  WBC 8.6 9.0 8.8 8.8  HGB 12.5 12.1 12.9 12.0  HCT 34.5* 33.7* 37.6 33.9*  PLT 391 341 367 339  MCV 85.2 85.8 88.3 85.2  MCH 30.9 30.8 30.3 30.2  MCHC 36.2* 35.9 34.3 35.4  RDW 12.1 12.1 12.5 12.3  LYMPHSABS 1.0 0.7 0.8  --   MONOABS 0.7 0.7 0.5  --   EOSABS 0.0 0.1 0.0  --   BASOSABS 0.0 0.0 0.0  --     Chemistries  Recent Labs  Lab 07/10/17 1314 07/11/17 0437 07/12/17 0433 07/12/17 1325 07/13/17 0535  NA 125* 132* 138 132* 131*  K 3.7 4.3 3.5 3.4* 3.6  CL 89* 100* 103 99* 101  CO2 27 22 20* 24 25  GLUCOSE 87 69 50* 186* 94  BUN 9 8 10 7  <5*  CREATININE 0.68 0.63 0.73 0.57 0.64  CALCIUM 9.4 9.1 9.4 9.3 9.4  MG  --  2.7*  --   --   --   AST 26  --   --   --   --   ALT 15  --   --   --   --   ALKPHOS 70  --   --   --   --   BILITOT 0.5  --   --   --   --    ------------------------------------------------------------------------------------------------------------------ No results for input(s): CHOL, HDL, LDLCALC, TRIG, CHOLHDL, LDLDIRECT in the last 72 hours.  No results found for:  HGBA1C ------------------------------------------------------------------------------------------------------------------ No results for input(s): TSH, T4TOTAL, T3FREE, THYROIDAB in the last 72 hours.  Invalid input(s): FREET3 ------------------------------------------------------------------------------------------------------------------ No results for input(s): VITAMINB12, FOLATE, FERRITIN, TIBC, IRON, RETICCTPCT in the last 72 hours.  Coagulation profile No results for input(s): INR, PROTIME in the last 168 hours.  No results for input(s): DDIMER in the last 72 hours.  Cardiac Enzymes No results for input(s): CKMB, TROPONINI, MYOGLOBIN in the last 168 hours.  Invalid input(s): CK ------------------------------------------------------------------------------------------------------------------ No results found for: BNP   Roxan Hockey M.D on 07/13/2017 at 4:37 PM  Between 7am to 7pm - Pager - [phone]  After 7pm go to [email or web address] - password [email or web address]  [facility] -  Office  [phone]  Voice Recognition Viviann Spare dictation system was used to create this note, attempts have been made to correct errors. Please contact the author with questions and/or clarifications.

## 2017-07-13 NOTE — Progress Notes (Signed)
Sonia Hill   DOB:[birth year]   QQ#:[id number]   JHE#:[id number]  Subjective:  Sonia Hill had her NG clamped at 10 AM yesterday.  She has been keeping clear liquids down.  She is pushing to advance to protein drinks.  She has had good colostomy activity, mostly pasty, some liquid.  She still has some lower abdominal/pelvic discomfort but it is intermittent.  Husband and sister in room--also Dr. Donne Hazel came in during the visit   Objective: middle aged White woman examined sitting on side of bed  Vitals:   07/12/17 2149 07/13/17 0615  BP: (!) 141/86 (!) 145/88  Pulse: (!) 103 87  Resp: 16 16  Temp: 98.6 F (37 C) 98.4 F (36.9 C)  SpO2: 100% 99%    Body mass index is 19.69 kg/m.  Intake/Output Summary (Last 24 hours) at 07/13/2017 1631 Last data filed at 07/13/2017 0904 Gross per 24 hour  Intake 3488.34 ml  Output 400 ml  Net 3088.34 ml    Sclerae unicteric, EOMs intact No cervical or supraclavicular adenopathy Lungs no rales or rhonchi Heart regular rate and rhythm Abd soft, nontender, positive bowel sounds, colostomy intact MSK no upper extremity lymphedema Neuro: nonfocal, well oriented, does affect Breasts: Deferred    CBG (last 3)  Recent Labs    07/12/17 2337 07/13/17 0613 07/13/17 1136  GLUCAP 104* 85 106*     Labs:  Lab Results  Component Value Date   WBC 8.8 07/13/2017   HGB 12.0 07/13/2017   HCT 33.9 (L) 07/13/2017   MCV 85.2 07/13/2017   PLT 339 07/13/2017   NEUTROABS 7.5 07/12/2017    '@LASTCHEMISTRY' @  Urine Studies No results for input(s): UHGB, CRYS in the last 72 hours.  Invalid input(s): UACOL, UAPR, USPG, UPH, UTP, UGL, UKET, UBIL, UNIT, UROB, [place], UEPI, UWBC, Duwayne Heck [place], [place]  Basic Metabolic Panel: Recent Labs  Lab 07/10/17 1314 07/11/17 0437 07/12/17 0433 07/12/17 1325 07/13/17 0535  NA 125* 132* 138 132* 131*  K 3.7 4.3 3.5 3.4* 3.6  CL 89* 100* 103 99* 101  CO2 27 22 20* 24 25  GLUCOSE 87 69 50* 186* 94  BUN '9  8 10 7 ' <5*  CREATININE 0.68 0.63 0.73 0.57 0.64  CALCIUM 9.4 9.1 9.4 9.3 9.4  MG  --  2.7*  --   --   --    GFR Estimated Creatinine Clearance: 54.4 mL/min (by C-G formula based on SCr of 0.64 mg/dL). Liver Function Tests: Recent Labs  Lab 07/10/17 1314  AST 26  ALT 15  ALKPHOS 70  BILITOT 0.5  PROT 6.3*  ALBUMIN 3.7   Recent Labs  Lab 07/10/17 1314  LIPASE 45   No results for input(s): AMMONIA in the last 168 hours. Coagulation profile No results for input(s): INR, PROTIME in the last 168 hours.  CBC: Recent Labs  Lab 07/10/17 1314 07/11/17 0437 07/12/17 0433 07/13/17 0535  WBC 8.6 9.0 8.8 8.8  NEUTROABS 6.8 7.5 7.5  --   HGB 12.5 12.1 12.9 12.0  HCT 34.5* 33.7* 37.6 33.9*  MCV 85.2 85.8 88.3 85.2  PLT 391 341 367 339   Cardiac Enzymes: No results for input(s): CKTOTAL, CKMB, CKMBINDEX, TROPONINI in the last 168 hours. BNP: Invalid input(s): POCBNP CBG: Recent Labs  Lab 07/12/17 1257 07/12/17 1912 07/12/17 2337 07/13/17 0613 07/13/17 1136  GLUCAP 213* 77 104* 85 106*   D-Dimer No results for input(s): DDIMER in the last 72 hours. Hgb A1c No results for input(s): HGBA1C  in the last 72 hours. Lipid Profile No results for input(s): CHOL, HDL, LDLCALC, TRIG, CHOLHDL, LDLDIRECT in the last 72 hours. Thyroid function studies No results for input(s): TSH, T4TOTAL, T3FREE, THYROIDAB in the last 72 hours.  Invalid input(s): FREET3 Anemia work up No results for input(s): VITAMINB12, FOLATE, FERRITIN, TIBC, IRON, RETICCTPCT in the last 72 hours. Microbiology No results found for this or any previous visit (from the past 240 hour(s)).    Studies:  Dg Abd 1 View  Result Date: 07/12/2017 CLINICAL DATA:  Small bowel obstruction. EXAM: ABDOMEN - 1 VIEW COMPARISON:  Radiograph of July 11, 2017. FINDINGS: Distal tip of nasogastric tube is seen in the stomach. No colonic dilatation is noted. Dilated small bowel loops are again noted concerning for distal  small bowel obstruction. Contrast is noted in the large bowel. IMPRESSION: Stable dilated small bowel loops are noted concerning for distal small bowel obstruction. Electronically Signed   By: Marijo Conception, M.D.   On: 07/12/2017 08:13   Dg Abd Portable 1v  Result Date: 07/13/2017 CLINICAL DATA:  62 year old female with small bowel obstruction. Rectal mass. Colostomy. EXAM: PORTABLE ABDOMEN - 1 VIEW COMPARISON:  07/12/2017 and earlier. FINDINGS: Portable AP supine view at 0446 hr. Stable enteric tube, side hole up the level of the proximal stomach. Stable and negative visible lung bases. Gas-filled small bowel loops remain dilated between 30-40 centimeters diameter throughout much of the abdomen. Decreased volume of retained contrast in the proximal colon. Left abdominal ostomy redemonstrated. Stable visualized osseous structures. IMPRESSION: 1. Stable enteric tube. 2. No significant improvement in partial small bowel obstruction pattern since 07/10/2017. 3. Decreasing volume of retained oral contrast in the proximal colon. Electronically Signed   By: Genevie Ann M.D.   On: 07/13/2017 08:44    Assessment: 62 y.o. BRCA 1-2 negative [facility] woman with lobular breast cancer stage IV at presentation July 2011  (1) status post left mastectomy and sentinel lymph node sampling in July 2011 for a lower inner quadrant T1 N1 M1, stage IV invasive lobular breast cancer, grade 1, strongly estrogen and progesterone receptor-positive, HER2 negative with MIB-1 of 9% and no HER2 amplification,   (2) with multiple sclerotic bone lesions at presentation seen only on CT scan (not on bone scan or PET scan), but  with biopsy-proven metastatic disease to bone; and an elevated CA 27.29 at presentation,   (3) Oncotype recurrence score of 4, predicts a good response to antiestrogens.  (4) Systemic treatment has consisted of             a) tamoxifen with evidence of response but poor tolerance             b) letrozole  starting August 2012, discontinued October 2014 per patient              (5) single functioning kidney  (6) status post bilateral salpingo-oophorectomy 01/24/2013, with benign pathology  (7) osteoporosis; the patient refuses bisphosphonate therapy; started [facility] February 2014.               (a) bone density was obtained under the care of Dr. Matthew Saras at [facility], 06/25/2012, showing osteoporosis with a T score -2.6. This was repeated 12/20/2013, showing again osteoporosis with T-scores between -2.4 and -2.8.             (b) the patient refuses zolendronate, denosumab, or other pharmacologic intervention             (c)  bone density on 12/30/2015 under Dr. Matthew Saras showing osteoporosis with T scores between -2.7 and -3.3  (8) the patient refuses standard Mammography or tomography; undergoing thermography screening of the right breast.  (9) PET scan 06/05/2015 shows rectal thickening and a presacral mass; biopsy of this area 10/09/2015 confirms metastatic lobular breast cancer, again estrogen receptor positive, HER-2 nonamplified.             (a) pelvic MRI 03/11/2016 confirms stability of disease             (b) chest CT and pelvic MRI 09/02/2016 shows no change in the circumferential rectal thickening or evidence of extension beyond the serosa and chest CT findings             (c) colonoscopy 02/23/2017 showed a very narrow rectal lumen with a few apparently uninvolved centimeters distally              (d) CT of the abdomen and pelvis 05/22/2017 (after diverting colostomy) shows interval increase in the size of the perirectal soft tissue masses.  (10) started fulvestrant and palbociclib 125 mg/ day [21/7] May 2017             (a) palbociclib dose decreased to 100 mg daily [21/7] with second cycle, started 11/25/2015             (b) palbociclib discontinued 03/20/2017 with evidence of disease progression             (c) last fulvestrant dose 03/24/2017,  discontinued with evidence of progression  (11) status post colostomy placement [facility] November 2018  (12) started exemestane 07/05/2017, everolimus to start as soon as SBO resolves  (13) small bowel obstruction:  (a) small bowel series 07/03/2017 shows no obstruction  (b) CT abd/pelvis 07/10/2017 c/w mid-distal SBO  (c) portable abd film 07/11/2017 shows some improvement but persistent SB dilatation   Plan:  Sonia Hill continues to improve on conservative measures.  On the other hand this is at least her second possibly third episode of partial SBO.  It may be that she will have intermittent problems with this and today Dr. Donne Hazel laid out the options for her, which include getting readmitted with an NG tube, having a PEG placed so she can essentially do the same thing she does in the hospital at home, or exploration.  At this point of course we are going to hope that she clears this and that it does not recur anytime soon.  She is agitating to go to [facility] next week as they had planned.  It may be wise to postpone that if possible.  Our oral chemotherapy pharmacy coordinator contacted there specialty pharmacy and the everolimus is being mailed to their home.  Once the SBO is resolved and she can take pills she will resume the exemestane and then start everolimus.  She has a good understanding of the possible toxicity side effects and complications of this medication including fatigue, rash, diarrhea, pneumonitis, and mucositis in particular.  The pelvic pain is not a good sign and this could become a severe if the tumor is invading some neural structures.  In that case aside from narcotics palliative radiation might be an option for her.  Currently she is scheduled to see me again on 07/28/2017.  That was on the assumption that they would be out of town, but if they are going to postpone the trip I will move her follow-up with me by week or so  Bobetta Lime, MD 07/13/2017  4:31 PM Medical Oncology and Hematology [facility] [address] [place], [place] [address] Tel. [phone]    Fax. [phone]

## 2017-07-14 ENCOUNTER — Inpatient Hospital Stay (HOSPITAL_COMMUNITY): Payer: 59

## 2017-07-14 DIAGNOSIS — C50912 Malignant neoplasm of unspecified site of left female breast: Secondary | ICD-10-CM

## 2017-07-14 DIAGNOSIS — Z0189 Encounter for other specified special examinations: Secondary | ICD-10-CM

## 2017-07-14 DIAGNOSIS — Z90710 Acquired absence of both cervix and uterus: Secondary | ICD-10-CM

## 2017-07-14 LAB — GLUCOSE, CAPILLARY
Glucose-Capillary: 81 mg/dL (ref 65–99)
Glucose-Capillary: 81 mg/dL (ref 65–99)

## 2017-07-14 LAB — BASIC METABOLIC PANEL
Anion gap: 7 (ref 5–15)
CALCIUM: 9.2 mg/dL (ref 8.9–10.3)
CO2: 25 mmol/L (ref 22–32)
CREATININE: 0.6 mg/dL (ref 0.44–1.00)
Chloride: 101 mmol/L (ref 101–111)
GFR calc Af Amer: >60 mL/min (ref >60)
GLUCOSE: 79 mg/dL (ref 65–99)
Potassium: 3.7 mmol/L (ref 3.5–5.1)
Sodium: 133 mmol/L — ABNORMAL LOW (ref 135–145)

## 2017-07-14 MED ORDER — ONDANSETRON 4 MG PO TBDP: 4.0000 mg | ORAL_TABLET | ORAL | 3 refills | Status: DC | PRN

## 2017-07-14 MED ORDER — TRAMADOL HCL 50 MG PO TABS: 50.0000 mg | ORAL_TABLET | Freq: Four times a day (QID) | ORAL | 0 refills | Status: DC | PRN

## 2017-07-14 MED ORDER — ALPRAZOLAM 0.25 MG PO TABS: 0.2500 mg | ORAL_TABLET | Freq: Three times a day (TID) | ORAL | 0 refills | Status: DC | PRN

## 2017-07-14 NOTE — Progress Notes (Signed)
Discharge instructions given. Pt verbalized understanding and all questions were answered.  

## 2017-07-14 NOTE — Discharge Summary (Signed)
Sonia Hill, is a 62 y.o. female  DOB 07/25/1955  MRN [id number].  Admission date:  07/10/2017  Admitting Physician  Florencia Reasons, MD  Discharge Date:  07/14/2017   Primary MD  [name], MD  Recommendations for primary care physician for things to follow:   Admission Diagnosis  Small bowel obstruction ([place]) [K56.609] SBO (small bowel obstruction) ([place]) [K56.609] Encounter for imaging study to confirm nasogastric (NG) tube placement [Z01.89]   Discharge Diagnosis  Small bowel obstruction ([place]) [K56.609] SBO (small bowel obstruction) ([place]) [K56.609] Encounter for imaging study to confirm nasogastric (NG) tube placement [Z01.89]    Active Problems:   SBO (small bowel obstruction) ([facility])   Protein-calorie malnutrition, severe      Past Medical History:  Diagnosis Date  . Arthritis    neck, upper back, shoulders - no meds - yoga  . Chronic kidney disease    Only has right kidney  . Depression    Hx - no current problem  . GERD (gastroesophageal reflux disease)    diet controlled - no meds  . Hyperlipidemia    diet controlled  . Hypothyroidism   . lt breast ca dx'd 11/2009  . Osteoporosis, unspecified 12/06/2013  . PONV (postoperative nausea and vomiting)   . SVD (spontaneous vaginal delivery)    x 1    Past Surgical History:  Procedure Laterality Date  . BLADDER NECK RECONSTRUCTION     made a new urethea tube  . bladder tact  2008  . BREAST SURGERY     masectomy left breast  . coloscopy    . COLOSTOMY    . DILATION AND CURETTAGE OF UTERUS    . LAPAROSCOPY N/A 01/24/2013   Procedure: LAPROSCOPY OPERATIVE;  Surgeon: Margarette Asal, MD;  Location: [facility];  Service: Gynecology;  Laterality: N/A;  . left kidney removed     left kidney dysfunction  . SALPINGOOPHORECTOMY Bilateral 01/24/2013   Procedure: SALPINGO OOPHORECTOMY;  Surgeon: Margarette Asal, MD;  Location: [facility];   Service: Gynecology;  Laterality: Bilateral;  . WISDOM TOOTH EXTRACTION         HPI  from the history and physical done on the day of admission:     Chief Complaint: Abdominal pain, nausea, vomiting  HPI: Sonia Hill is a 62 y.o. female   With metastatic breast cancer, status post colostomy due to rectal mass presented to [facility] emergency room due to above complaints.  Patient reports she had small bowel obstruction 3 weeks ago, she was treated at  [facility] in [place].  She had a follow-up small bowel x-ray on January 21 which was unremarkable.  She reports she felt good when she has her follow-up appointment with Dr. [name] on the 23rd.  However upon returning home after the follow-up appointment, she started to have nausea and vomiting.  Her symptoms has progressed, she started to have abdominal spasm.  She has not eat anything since Friday.  she did not have any colostomy output in  the last 24 hours.  She feels dehydrated.  Denies fever.  ED course: Vital signs are stable, Basic lab work CBC unremarkable, sodium 125, potassium 3.7, creatinine 0.68.  CT ab/pel showed "The stomach, duodenum and small bowel are dilated and demonstrates scattered air-fluid levels. There is a transition to decompressed small bowel loops in the right lower quadrant. Findings consistent with a mid distal small bowel obstruction"  She is given IV fluids, IV Ativan and Zofran, patient reports feeling better.  Hospitalist called to admit the patient.       Hospital Course:      1)Recurrent sbo in the setting  of breast cancer with GI mets-clinically and radiologically improving, patient had pasty, brown stool in her colostomy bag tolerating full liquids,   no emesis  2)Metastatic breast cancer with bony and rectal/Gi mets s/p colostomy- Oncology Dr [name] following, further chemotherapy is planned as outpatient once patient recovers  3)Right-sided hydroureteronephrosis  relatively stable when compared to prior studyon CT scan- She denies flank pain, cr wnl.  History of prior Lt nephrectomy, Curvilinear radiodensity in the right pelvis possibly a fractured/retained catheter tip.Incidental finding on CT ab/pel.   4)Moderate to Severe Protein caloric malnutrition: Body mass index is 18.14 kg/m.,  Nutritional supplements advised, dietitian consult appreciated  Discharge Condition: home  Follow UP-oncology   Consults obtained -General surgery/oncology  Diet and Activity recommendation:  As advised  Discharge Instructions    Discharge Instructions    Call MD for:  difficulty breathing, headache or visual disturbances   Complete by:  As directed    Call MD for:  persistant dizziness or light-headedness   Complete by:  As directed    Call MD for:  persistant nausea and vomiting   Complete by:  As directed    Call MD for:  severe uncontrolled pain   Complete by:  As directed    Diet general   Complete by:  As directed    Discharge instructions   Complete by:  As directed    Soft Diet advised   Increase activity slowly   Complete by:  As directed         Discharge Medications     Allergies as of 07/14/2017      Reactions   Adhesive [tape] Rash   Ok to use paper tape and tegaderm over IV site   Betadine [povidone Iodine] Rash   Mercury Rash   Reaction mercurachrome   Sulfa Antibiotics Rash      Medication List    TAKE these medications   ALPRAZolam 0.25 MG tablet Commonly known as:  XANAX Take 1 tablet (0.25 mg total) by mouth every 8 (eight) hours as needed for anxiety or sleep.   baclofen 20 MG tablet Commonly known as:  LIORESAL Take 1 tablet (20 mg total) by mouth 3 (three) times daily.   cholecalciferol 400 UNIT/ML Liqd Commonly known as:  D-VI-SOL Take 400 Units by mouth daily.   everolimus 5 MG tablet Commonly known as:  AFINITOR Take 1 tablet (5 mg total) by mouth daily. Take with a full glass of water, consistently  with or without food.   exemestane 25 MG tablet Commonly known as:  AROMASIN Take 1 tablet (25 mg total) by mouth daily after breakfast.   gabapentin 300 MG capsule Commonly known as:  NEURONTIN Take 1 capsule (300 mg total) by mouth 3 (three) times daily as needed. What changed:  reasons to take this   ibuprofen 200 MG tablet Commonly known  as:  ADVIL,MOTRIN Take 200 mg by mouth every 6 (six) hours as needed for moderate pain.   NATURE-THROID 81.25 MG Tabs Generic drug:  Thyroid Take 81.25 mg by mouth daily.   ondansetron 8 MG disintegrating tablet Commonly known as:  ZOFRAN-ODT Take 1 tablet (8 mg total) by mouth every 8 (eight) hours as needed for nausea or vomiting. What changed:  Another medication with the same name was changed. Make sure you understand how and when to take each.   ondansetron 4 MG disintegrating tablet Commonly known as:  ZOFRAN ODT Take 1 tablet (4 mg total) by mouth every 4 (four) hours as needed for nausea or vomiting. What changed:  when to take this   polyethylene glycol packet Commonly known as:  MIRALAX / GLYCOLAX Take 17 g by mouth daily.   promethazine 25 MG suppository Commonly known as:  PHENERGAN Place 1 suppository (25 mg total) rectally every 6 (six) hours as needed for nausea or vomiting.   simethicone 125 MG chewable tablet Commonly known as:  MYLICON Chew 628 mg by mouth every 6 (six) hours as needed for flatulence.   traMADol 50 MG tablet Commonly known as:  ULTRAM Take 1 tablet (50 mg total) by mouth every 6 (six) hours as needed.       Major procedures and Radiology Reports - PLEASE review detailed and final reports for all details, in brief -   Dg Abd 1 View  Result Date: 07/14/2017 CLINICAL DATA:  Small bowel obstruction EXAM: ABDOMEN - 1 VIEW COMPARISON:  07/13/2017; 07/12/2017; 07/10/2017; CT abdomen and pelvis - 07/10/2017 FINDINGS: Interval removal of enteric tube. Persistent gas distention of multiple loops of large  and small bowel with index loop of small bowel within the left upper abdominal quadrant measuring approximately 3.8 cm in diameter, similar to the 1/31 examination. Enteric contrast is again seen within the cecum and ascending colon, grossly unchanged. No supine evidence of pneumoperitoneum. No pneumatosis or portal venous gas. No definitive abnormal intra-abdominal calcifications. Limited visualization of the lower thorax is normal. No acute osseus abnormalities. IMPRESSION: 1. Similar findings worrisome for small bowel obstruction. 2. Interval removal of enteric tube. Electronically Signed   By: Sandi Mariscal M.D.   On: 07/14/2017 07:48   Dg Abd 1 View  Result Date: 07/12/2017 CLINICAL DATA:  Small bowel obstruction. EXAM: ABDOMEN - 1 VIEW COMPARISON:  Radiograph of July 11, 2017. FINDINGS: Distal tip of nasogastric tube is seen in the stomach. No colonic dilatation is noted. Dilated small bowel loops are again noted concerning for distal small bowel obstruction. Contrast is noted in the large bowel. IMPRESSION: Stable dilated small bowel loops are noted concerning for distal small bowel obstruction. Electronically Signed   By: Marijo Conception, M.D.   On: 07/12/2017 08:13   Ct Abdomen Pelvis W Contrast  Result Date: 07/10/2017 CLINICAL DATA:  History of breast cancer and rectal cancer. Abdominal pain and bloating. EXAM: CT ABDOMEN AND PELVIS WITH CONTRAST TECHNIQUE: Multidetector CT imaging of the abdomen and pelvis was performed using the standard protocol following bolus administration of intravenous contrast. CONTRAST:  80 cc  ISOVUE-300 IOPAMIDOL (ISOVUE-300) INJECTION 61% COMPARISON:  CT scan 05/22/2017 FINDINGS: Lower chest: The lung bases are clear. The heart is normal in size. No pericardial effusion. Hepatobiliary: No focal hepatic lesions to suggest metastatic disease. The gallbladder appears normal. No common bile duct dilatation. Pancreas: No mass, inflammation or ductal dilatation. Spleen:  Normal size.  No focal lesions. Adrenals/Urinary Tract: Adrenal glands  are normal. The left kidney is surgically absent. The right kidney demonstrates compensatory hypertrophy and moderate right-sided hydroureteronephrosis. Stomach/Bowel: The stomach, duodenum and small bowel are dilated and demonstrates scattered air-fluid levels. There is a transition to decompressed small bowel loops in the right lower quadrant. Findings consistent with a mid distal small bowel obstruction. Contrast in the colon is likely from the previous small bowel follow-through from 07/03/2017. Stable left lower quadrant colostomy. Large infiltrating lower rectal mass which appears to involve the anus, presacral soft tissues and the uterus. Vascular/Lymphatic: The major vascular structures are patent. Small scattered mesenteric and retroperitoneal lymph nodes are stable. Reproductive: The uterus appears to be involved with rectal tumor. Other: There is a curvilinear radiodensity in the right pelvis which was not present on the prior study. It may be a fractured catheter tip or prior ureteral stent. Recommend correlation with clinical history. Musculoskeletal: No significant bony findings. IMPRESSION: 1. Large fungating/infiltrating rectal mass, relatively stable. 2. Mid distal small bowel obstruction. 3. Right-sided hydroureteronephrosis relatively stable when compared to prior study. 4. Curvilinear radiodensity in the right pelvis possibly a fractured/retained catheter tip. Electronically Signed   By: Marijo Sanes M.D.   On: 07/10/2017 16:21   Dg Small Bowel  Result Date: 07/03/2017 CLINICAL DATA:  Metastatic breast cancer with rectal metastases. Diverting colostomy. Small bowel obstruction. EXAM: SMALL BOWEL SERIES COMPARISON:  CT 05/22/2017.  Radiographs 06/19/2017. TECHNIQUE: Following ingestion of thin barium, serial small bowel images were obtained including spot views of the terminal ileum. FLUOROSCOPY TIME:  Fluoroscopy Time: 1  min and 6 sec of low-dose pulsed fluoro Radiation Exposure Index (if provided by the fluoroscopic device): 60 mGy Number of Acquired Spot Images: 0 FINDINGS: The scout abdominal radiograph demonstrates improvement in the previously demonstrated small bowel dilatation. No significant dilated loops of small bowel are seen. There is stool in the right colon. Left-sided colostomy noted. The patient drank the water-soluble contrast without difficulty. Overhead abdominal radiographs were obtained at 10 and 30 min supplemented by fluoroscopic observation and spot images. The stomach and proximal small bowel appear normal. Contrast reaches the colon within 30 min of initial ingestion. The small bowel appears foreshortened, suggesting previous partial small bowel resection. There are few mildly dilated loops of small bowel in the right mid abdomen. At fluoroscopy, these are not fixed. There prominent small bowel peristalsis at fluoroscopy, recorded on series 3 and series 5. The terminal ileum was difficult to isolate, although appears grossly normal. No contrast extravasation. IMPRESSION: 1. Rapid transit of contrast through the small bowel, reaching the colon within 30 min of initial ingestion. No evidence of bowel obstruction. 2. There are few transiently dilated loops of small bowel and prominent peristalsis at fluoroscopy. Electronically Signed   By: Richardean Sale M.D.   On: 07/03/2017 12:37   Dg Abd 2 Views  Result Date: 07/10/2017 CLINICAL DATA:  Bloating. Abdominal pain and nausea. Colostomy in November. Metastatic breast cancer. EXAM: ABDOMEN - 2 VIEW COMPARISON:  07/03/2017 small bowel follow-through. FINDINGS: Upright and supine views. The upright view demonstrates no free intraperitoneal air. Surgical clips about the left lower chest. Air-fluid levels which could be within large or small bowel loops. Supine image demonstrates gaseous distension of small bowel including at 5.1 cm. IMPRESSION: Development of  moderate small bowel dilatation with bowel air-fluid levels. Suspicious for distal small bowel or colonic obstruction. Adynamic ileus is felt less likely. No free intraperitoneal air or other acute complication. Electronically Signed   By: Adria Devon.D.  On: 07/10/2017 10:51   Dg Abd 2 Views  Result Date: 06/19/2017 CLINICAL DATA:  Abnormal findings on examination of gastrointestinal tract. Bloating with pain. History of colostomy. EXAM: ABDOMEN - 2 VIEW COMPARISON:  CT 05/22/2017 FINDINGS: Dilated loops of small bowel with air-fluid levels. Large amount of stool in the right colon. There is a colostomy in the left lower abdomen. No evidence for free air on the upright view. Multiple surgical clips in the left lower chest and prior left mastectomy. IMPRESSION: Dilated loops of small bowel are suggestive for a small bowel obstruction. These results will be called to the ordering clinician or representative by the Radiology Department at the imaging location. Electronically Signed   By: Markus Daft M.D.   On: 06/19/2017 13:28   Dg Abd Portable 1v  Result Date: 07/13/2017 CLINICAL DATA:  62 year old female with small bowel obstruction. Rectal mass. Colostomy. EXAM: PORTABLE ABDOMEN - 1 VIEW COMPARISON:  07/12/2017 and earlier. FINDINGS: Portable AP supine view at 0446 hr. Stable enteric tube, side hole up the level of the proximal stomach. Stable and negative visible lung bases. Gas-filled small bowel loops remain dilated between 30-40 centimeters diameter throughout much of the abdomen. Decreased volume of retained contrast in the proximal colon. Left abdominal ostomy redemonstrated. Stable visualized osseous structures. IMPRESSION: 1. Stable enteric tube. 2. No significant improvement in partial small bowel obstruction pattern since 07/10/2017. 3. Decreasing volume of retained oral contrast in the proximal colon. Electronically Signed   By: Genevie Ann M.D.   On: 07/13/2017 08:44   Dg Abd Portable 1v-small  Bowel Obstruction Protocol-initial, 8 Hr Delay  Result Date: 07/11/2017 CLINICAL DATA:  Small bowel obstruction. EXAM: PORTABLE ABDOMEN - 1 VIEW COMPARISON:  07/10/2017 FINDINGS: There is a nasogastric tube with the tip projecting over the body of the stomach. There is persistent small bowel dilatation measuring up to 4 cm. There is oral contrast material which has transited into the colon. There is no evidence of pneumoperitoneum, portal venous gas or pneumatosis. There are no pathologic calcifications along the expected course of the ureters. The osseous structures are unremarkable. IMPRESSION: 1. Nasogastric tube with the tip projecting over the body of the stomach. 2. Small bowel dilatation measuring up to 4 cm with oral contrast material which has transited into the colon. Findings are suggestive of a partial small bowel obstruction. Electronically Signed   By: Kathreen Devoid   On: 07/11/2017 08:14   Dg Abd Portable 1v-small Bowel Protocol-position Verification  Result Date: 07/10/2017 CLINICAL DATA:  STAT READ. eval for NG tube placement for start of small bowel protocol. abd pain. Nausea for several days. EXAM: PORTABLE ABDOMEN - 1 VIEW COMPARISON:  CT 07/10/2017 FINDINGS: Nasogastric tube is in place, tip overlying the level of stomach. There is persistent significant dilatation of small bowel loops, greater than dilatation of large bowel loops and consistent with small bowel obstruction. Contrast is identified within the colon and urinary bladder following CT earlier. IMPRESSION: Nasogastric tube to the region of the stomach. Small bowel obstruction. Electronically Signed   By: Nolon Nations M.D.   On: 07/10/2017 19:03    Micro Results   No results found for this or any previous visit (from the past 240 hour(s)).     Today   Subjective    Kiley Solimine today has no new concerns , tolerating her intake well, patient's husband at bedside, questions answered       .  Dietitian at  bedside   Patient has  been seen and examined prior to discharge   Objective   Blood pressure 125/76, pulse 79, temperature 98.1 F (36.7 C), temperature source Oral, resp. rate 18, height 5\' 1"  (1.549 m), weight 47.3 kg (104 lb 3.2 oz), SpO2 98 %.   Intake/Output Summary (Last 24 hours) at 07/14/2017 1301 Last data filed at 07/14/2017 1039 Gross per 24 hour  Intake 3855.33 ml  Output 300 ml  Net 3555.33 ml    Exam Gen:- Awake Alert, cachectic appearing, in no apparent distress  HEENT:- [place].AT, No sclera icterus Nose-NG tube removed Neck-Supple Neck,No JVD,.  Lungs-  CTAB  CV- S1, S2 normal Abd-  +ve B.Sounds, Abd Soft, No tenderness, colostomy bag with liquid/semisolid contents Extremity/Skin:- No  edema,    Psych-appropriate affect     Data Review   CBC w Diff:  Lab Results  Component Value Date   WBC 8.8 07/13/2017   HGB 12.0 07/13/2017   HGB 12.4 05/16/2017   HCT 33.9 (L) 07/13/2017   HCT 36.9 05/16/2017   PLT 339 07/13/2017   PLT 286 05/16/2017   LYMPHOPCT 9 07/12/2017   LYMPHOPCT 21.0 05/16/2017   MONOPCT 5 07/12/2017   MONOPCT 10.0 05/16/2017   EOSPCT 0 07/12/2017   EOSPCT 6.2 05/16/2017   BASOPCT 1 07/12/2017   BASOPCT 0.8 05/16/2017    CMP:  Lab Results  Component Value Date   NA 133 (L) 07/14/2017   NA 134 (L) 12/28/2016   K 3.7 07/14/2017   K 4.0 12/28/2016   CL 101 07/14/2017   CL 98 12/03/2012   CO2 25 07/14/2017   CO2 25 12/28/2016   BUN <5 (L) 07/14/2017   BUN 12.0 12/28/2016   CREATININE 0.60 07/14/2017   CREATININE 0.8 12/28/2016   PROT 6.3 (L) 07/10/2017   PROT 6.5 12/28/2016   ALBUMIN 3.7 07/10/2017   ALBUMIN 3.9 12/28/2016   BILITOT 0.5 07/10/2017   BILITOT 0.50 12/28/2016   ALKPHOS 70 07/10/2017   ALKPHOS 76 12/28/2016   AST 26 07/10/2017   AST 22 12/28/2016   ALT 15 07/10/2017   ALT 20 12/28/2016  .   Total Discharge time is about 33 minutes  Roxan Hockey M.D on 07/14/2017 at 1:01 PM  [facility]   Office   [phone]  Voice Recognition Viviann Spare dictation system was used to create this note, attempts have been made to correct errors. Please contact the author with questions and/or clarifications.

## 2017-07-14 NOTE — Progress Notes (Signed)
Nutrition Education Note  RD consulted for nutrition education regarding patient on full liquid diet.  Reviewed clear and full liquids. Reviewed gas producing foods. Recommended use of a liquid multivitamin while following a liquid diet.   Pt has history of being vegan and had questions regarding high calorie high protein options she could use post discharge. Discussed the use of bone broths, juices, cream of wheat, and protein supplements that fall within her vegan restrictions. Pt is not strict vegan, typically eats chicken/fish. Tries to limit dairy if possible but amendable to introducing a small amount back in. Discussed dairy options she could use. Spoke about adding healthy fats into liquids like olive oil or coconut oil to provide extra calories. Encourage pt to limit fiber until next MD visit. Pt is to see Beatrice RD in the following weeks.   Of note: Pt does not eat processed foods. Only eats organic and locally grown products.   RD discussed why it is important for patient to adhere to diet recommendations. Teach back method used.  Expect great compliance.   Body mass index is 19.69 kg/m.  Pt meets criteria for normal based on current BMI.  Current diet order is full liquid. Labs and medications reviewed. No further nutrition interventions warranted at this time. If additional nutrition issues arise, please re-consult RD.   Mariana Single RD, LDN Clinical Nutrition Pager # [phone]

## 2017-07-14 NOTE — Telephone Encounter (Signed)
Oral Oncology Patient Advocate Encounter  Confirmed with [facility] that the patient received her initial shipment of Afinitor on 07/14/2017.   [name]. [name], [facility] [phone] 07/14/2017 2:48 PM

## 2017-07-14 NOTE — Progress Notes (Signed)
CC: Nausea vomiting and abdominal pain  Subjective: She is tolerating full liquids well and feels very optimistic.  She would like to go home.  I told her her film did not look any better.  She is asking about more information on full liquids and what she can take orally.  She has a history of being of the vegan and would like the dietitian to see her again to assist with oral intake.  Her ostomy is slightly prolapsed a.m.  Currently there is nothing in the bag but some gas.  She says she had stool in it twice yesterday.  She was told the prolapse is her body's reaction to different changes.  Objective: Vital signs in last 24 hours: Temp:  [98.1 F (36.7 C)-99.1 F (37.3 C)] 98.1 F (36.7 C) (02/01 0537) Pulse Rate:  [79-97] 79 (02/01 0537) Resp:  [18] 18 (02/01 0537) BP: (125-128)/(76-79) 125/76 (02/01 0537) SpO2:  [98 %] 98 % (02/01 0537)  2100 p.o. Recorded 1359 IV recorded Urine 700 recorded Stool x2 recorded Afebrile vital signs are stable blood pressure was actually up a little yesterday. BMP is stable potassium is 3.7. Her film today: NG tube is removed.  Persistent gas distention of multiple loops of small and large bowel with index loop of small bowel in the left upper abdomen measuring 3.8 cm similar to results yesterday.  Enteric contrast is seen in the cecum and ascending colon no pneumomediastinum or pneumatosis no portal gas Intake/Output from previous day: 01/31 0701 - 02/01 0700 In: 3495.3 [P.O.:2100; I.V.:1395.3] Out: 700 [Urine:700] Intake/Output this shift: Total I/O In: 120 [P.O.:120] Out: -   General appearance: alert, cooperative and no distress GI: Minimally distended.  Bag is been changed but she has some gas in it.  Ostomy is somewhat prolapsed this a.m.  Lab Results:  Recent Labs    07/12/17 0433 07/13/17 0535  WBC 8.8 8.8  HGB 12.9 12.0  HCT 37.6 33.9*  PLT 367 339    BMET Recent Labs    07/13/17 0535 07/14/17 0447  NA 131* 133*  K  3.6 3.7  CL 101 101  CO2 25 25  GLUCOSE 94 79  BUN <5* <5*  CREATININE 0.64 0.60  CALCIUM 9.4 9.2   PT/INR No results for input(s): LABPROT, INR in the last 72 hours.  Recent Labs  Lab 07/10/17 1314  AST 26  ALT 15  ALKPHOS 70  BILITOT 0.5  PROT 6.3*  ALBUMIN 3.7     Lipase     Component Value Date/Time   LIPASE 45 07/10/2017 1314     Medications: . enoxaparin (LOVENOX) injection  40 mg Subcutaneous Daily  . 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  We will have the dietitian see her and discuss her options.  Continue to monitor for now.   LOS: 2 days           LOS: 4 days    Donny Heffern 07/14/2017 [phone]

## 2017-07-14 NOTE — Consult Note (Addendum)
[facility] Nurse ostomy follow up Stoma type/location: LLQ Colostomy, pouch changed earlier this morning. Stomal assessment/size: Slightly prolapsed today with likely  increase in intraabdominal pressure. Peristomal assessment: not seen today, patient reports this is unremarkable Treatment options for stomal/peristomal skin: skin barrier ring Output: nothing in pouch today, soft brown stool yesterday Ostomy pouching: 2pc. 2 and 1/4 inch pouching system with skin barrier ring Education provided: Support provided. Patient is eager to go home and try fluids (smoothies, etc.). See [facility]'s PA Will Creig Hines note from earlier today regarding no change in obstructive status. PAtient and family are optimistic. Enrolled patient in [facility] Discharge program: Yes, previously.  [facility] nursing team will follow while in house, and will remain available to this patient, the nursing, surgical and medical teams.   Thanks, Maudie Flakes, MSN, RN, [facility], Arther Abbott  Pager# [phone]

## 2017-07-14 NOTE — Progress Notes (Signed)
Sonia Hill   DOB:12/10/1955   KG#:[id number]   GUY#:[id number]  Subjective:  [name] has been off NG suction for the last 48 hours.  She had clear liquids then full liquids yesterday and she had a full liquid breakfast today.  She had good colostomy output yesterday and none so far this morning.  She has no abdominal discomfort, no nausea or she is feeling like her usual self (her husband looks like a wreck) and she is eager to get home.   Objective: middle aged White woman sitting at bedside  Vitals:   07/13/17 2137 07/14/17 0537  BP: 128/79 125/76  Pulse: 97 79  Resp: 18 18  Temp: 99.1 F (37.3 C) 98.1 F (36.7 C)  SpO2: 98% 98%    Body mass index is 19.69 kg/m.  Intake/Output Summary (Last 24 hours) at 07/14/2017 0915 Last data filed at 07/14/2017 0800 Gross per 24 hour  Intake 3615.33 ml  Output 300 ml  Net 3315.33 ml    CBG (last 3)  Recent Labs    07/13/17 1819 07/14/17 0023 07/14/17 0536  GLUCAP 123* 81 81     Labs:  Lab Results  Component Value Date   WBC 8.8 07/13/2017   HGB 12.0 07/13/2017   HCT 33.9 (L) 07/13/2017   MCV 85.2 07/13/2017   PLT 339 07/13/2017   NEUTROABS 7.5 07/12/2017    '@LASTCHEMISTRY' @  Urine Studies No results for input(s): UHGB, CRYS in the last 72 hours.  Invalid input(s): UACOL, UAPR, USPG, UPH, UTP, UGL, UKET, UBIL, UNIT, UROB, [place], UEPI, UWBC, Sonia Hill, [place]  Basic Metabolic Panel: Recent Labs  Lab 07/11/17 0437 07/12/17 0433 07/12/17 1325 07/13/17 0535 07/14/17 0447  NA 132* 138 132* 131* 133*  K 4.3 3.5 3.4* 3.6 3.7  CL 100* 103 99* 101 101  CO2 22 20* '24 25 25  ' GLUCOSE 69 50* 186* 94 79  BUN '8 10 7 ' <5* <5*  CREATININE 0.63 0.73 0.57 0.64 0.60  CALCIUM 9.1 9.4 9.3 9.4 9.2  MG 2.7*  --   --   --   --    GFR Estimated Creatinine Clearance: 54.4 mL/min (by C-G formula based on SCr of 0.6 mg/dL). Liver Function Tests: Recent Labs  Lab 07/10/17 1314  AST 26  ALT 15  ALKPHOS 70  BILITOT 0.5   PROT 6.3*  ALBUMIN 3.7   Recent Labs  Lab 07/10/17 1314  LIPASE 45   No results for input(s): AMMONIA in the last 168 hours. Coagulation profile No results for input(s): INR, PROTIME in the last 168 hours.  CBC: Recent Labs  Lab 07/10/17 1314 07/11/17 0437 07/12/17 0433 07/13/17 0535  WBC 8.6 9.0 8.8 8.8  NEUTROABS 6.8 7.5 7.5  --   HGB 12.5 12.1 12.9 12.0  HCT 34.5* 33.7* 37.6 33.9*  MCV 85.2 85.8 88.3 85.2  PLT 391 341 367 339   Cardiac Enzymes: No results for input(s): CKTOTAL, CKMB, CKMBINDEX, TROPONINI in the last 168 hours. BNP: Invalid input(s): POCBNP CBG: Recent Labs  Lab 07/13/17 0613 07/13/17 1136 07/13/17 1819 07/14/17 0023 07/14/17 0536  GLUCAP 85 106* 123* 81 81   D-Dimer No results for input(s): DDIMER in the last 72 hours. Hgb A1c No results for input(s): HGBA1C in the last 72 hours. Lipid Profile No results for input(s): CHOL, HDL, LDLCALC, TRIG, CHOLHDL, LDLDIRECT in the last 72 hours. Thyroid function studies No results for input(s): TSH, T4TOTAL, T3FREE, THYROIDAB in the last 72 hours.  Invalid input(s):  FREET3 Anemia work up No results for input(s): VITAMINB12, FOLATE, FERRITIN, TIBC, IRON, RETICCTPCT in the last 72 hours. Microbiology No results found for this or any previous visit (from the past 240 hour(s)).    Studies:  Dg Abd 1 View  Result Date: 07/14/2017 CLINICAL DATA:  Small bowel obstruction EXAM: ABDOMEN - 1 VIEW COMPARISON:  07/13/2017; 07/12/2017; 07/10/2017; CT abdomen and pelvis - 07/10/2017 FINDINGS: Interval removal of enteric tube. Persistent gas distention of multiple loops of large and small bowel with index loop of small bowel within the left upper abdominal quadrant measuring approximately 3.8 cm in diameter, similar to the 1/31 examination. Enteric contrast is again seen within the cecum and ascending colon, grossly unchanged. No supine evidence of pneumoperitoneum. No pneumatosis or portal venous gas. No  definitive abnormal intra-abdominal calcifications. Limited visualization of the lower thorax is normal. No acute osseus abnormalities. IMPRESSION: 1. Similar findings worrisome for small bowel obstruction. 2. Interval removal of enteric tube. Electronically Signed   By: Sandi Mariscal M.D.   On: 07/14/2017 07:48   Dg Abd Portable 1v  Result Date: 07/13/2017 CLINICAL DATA:  62 year old female with small bowel obstruction. Rectal mass. Colostomy. EXAM: PORTABLE ABDOMEN - 1 VIEW COMPARISON:  07/12/2017 and earlier. FINDINGS: Portable AP supine view at 0446 hr. Stable enteric tube, side hole up the level of the proximal stomach. Stable and negative visible lung bases. Gas-filled small bowel loops remain dilated between 30-40 centimeters diameter throughout much of the abdomen. Decreased volume of retained contrast in the proximal colon. Left abdominal ostomy redemonstrated. Stable visualized osseous structures. IMPRESSION: 1. Stable enteric tube. 2. No significant improvement in partial small bowel obstruction pattern since 07/10/2017. 3. Decreasing volume of retained oral contrast in the proximal colon. Electronically Signed   By: Genevie Ann M.D.   On: 07/13/2017 08:44    Assessment: 62 y.o. BRCA 1-2 negative [facility] woman with lobular breast cancer stage IV at presentation July 2011  (1) status post left mastectomy and sentinel lymph node sampling in July 2011 for a lower inner quadrant T1 N1 M1, stage IV invasive lobular breast cancer, grade 1, strongly estrogen and progesterone receptor-positive, HER2 negative with MIB-1 of 9% and no HER2 amplification,   (2) with multiple sclerotic bone lesions at presentation seen only on CT scan (not on bone scan or PET scan), but  with biopsy-proven metastatic disease to bone; and an elevated CA 27.29 at presentation,   (3) Oncotype recurrence score of 4, predicts a good response to antiestrogens.  (4) Systemic treatment has consisted of             a) tamoxifen  with evidence of response but poor tolerance             b) letrozole starting August 2012, discontinued October 2014 per patient              (5) single functioning kidney  (6) status post bilateral salpingo-oophorectomy 01/24/2013, with benign pathology  (7) osteoporosis; the patient refuses bisphosphonate therapy; started [facility] February 2014.               (a) bone density was obtained under the care of Dr. Matthew Saras at [facility], 06/25/2012, showing osteoporosis with a T score -2.6. This was repeated 12/20/2013, showing again osteoporosis with T-scores between -2.4 and -2.8.             (b) the patient refuses zolendronate, denosumab, or other pharmacologic intervention             (  c) bone density on 12/30/2015 under Dr. Matthew Saras showing osteoporosis with T scores between -2.7 and -3.3  (8) the patient refuses standard Mammography or tomography; undergoing thermography screening of the right breast.  (9) PET scan 06/05/2015 shows rectal thickening and a presacral mass; biopsy of this area 10/09/2015 confirms metastatic lobular breast cancer, again estrogen receptor positive, HER-2 nonamplified.             (a) pelvic MRI 03/11/2016 confirms stability of disease             (b) chest CT and pelvic MRI 09/02/2016 shows no change in the circumferential rectal thickening or evidence of extension beyond the serosa and chest CT findings             (c) colonoscopy 02/23/2017 showed a very narrow rectal lumen with a few apparently uninvolved centimeters distally              (d) CT of the abdomen and pelvis 05/22/2017 (after diverting colostomy) shows interval increase in the size of the perirectal soft tissue masses.  (10) started fulvestrant and palbociclib 125 mg/ day [21/7] May 2017             (a) palbociclib dose decreased to 100 mg daily [21/7] with second cycle, started 11/25/2015             (b) palbociclib discontinued 03/20/2017 with evidence of disease  progression             (c) last fulvestrant dose 03/24/2017, discontinued with evidence of progression  (11) status post colostomy placement [facility] November 2018  (12) started exemestane 07/05/2017, everolimus to start 07/15/2017  (13) small bowel obstruction:  (a) small bowel series 07/03/2017 shows no obstruction  (b) CT abd/pelvis 07/10/2017 c/w mid-distal SBO  (c) portable abd film 07/11/2017 shows some improvement but persistent SB dilatation  (d) clinically improved with persistent KUB findings 07/14/2017   Plan:  Hanley will likely be released home by surgery today.  She has a very good understanding of what she can and cannot take in at present and she is planning on many small meals instead of 2 or 3 large meals.  She knows that activity and particularly walking also will be helpful to her.  She and her husband have not yet decided whether or not to go to [place] this coming week.  They are going to see what happens over the weekend.  If they do go to [place] they have an appointment with me already on 02 15.  If they do not go they will call me and I will probably work him in sometime next week.  We again discussed everolimus.  The pills are at her pharmacy to be picked up so they can pick it up today and started tomorrow.  She is worried that it may be a "horse pill".  I have cautioned her not to crush it, cut it, or open it if it is a capsule.  She has to take it whole.  If she cannot take this pill we will need to do intravenous chemotherapy.  I am hopeful with treatment the partial SBO/SBO events will become less frequent.  If not we will have to consider PEG tube placement as suggested by surgery  Analicia knows to call for any other issues that may develop before her next visit.     Bobetta Lime, MD 07/14/2017  9:15 AM Medical Oncology and Hematology [address]  [place], [place] [address] Tel. [phone]    Fax.  [phone]

## 2017-07-17 ENCOUNTER — Encounter: Payer: 59 | Admitting: Nutrition

## 2017-07-17 ENCOUNTER — Telehealth: Payer: Self-pay

## 2017-07-17 ENCOUNTER — Telehealth: Payer: Self-pay | Admitting: Nutrition

## 2017-07-17 NOTE — Telephone Encounter (Signed)
Spoke with patient on the telephone regarding her oral diet s/p colostomy. She has been following a full liquid diet and trying to avoid fiber. She usually follows a vegan diet. She has been consuming bone broth and has introduced some dairy back into her diet. Educated patient on protein choices. Encouraged patient to try [employer] Vegan Protein shake. Made a follow up for Friday, Feb 15.

## 2017-07-17 NOTE — Telephone Encounter (Signed)
Returned pt call and left VM. Pt was asking for our fax number and questioning about handicap sticker. Fax number given and informed pt paperwork for handicap is available here in our office for her to pickup.  Cyndia Bent RN

## 2017-07-21 ENCOUNTER — Telehealth: Payer: Self-pay | Admitting: Oncology

## 2017-07-21 NOTE — Telephone Encounter (Signed)
07/21/17 @ 1:45 pm called and spoke with patient confirming husband's FMLA paperwork was successfully faxed to [facility] @ [phone] on 07/14/17 @ 10:06 am.  Patient requested a copy be sent via mail for personal records.

## 2017-07-24 ENCOUNTER — Telehealth: Payer: Self-pay

## 2017-07-24 ENCOUNTER — Telehealth: Payer: Self-pay | Admitting: Pharmacy Technician

## 2017-07-24 NOTE — Telephone Encounter (Signed)
Pt called stating she only received 14 day supply of Afinitor from [place] This RN spoke with Rep at [facility] who stated medication needs prior auth from insurance company before full 30 day supply can be filled.  LVM with this information on pt's vm.  In basket msg sent to [place] and [place] to obtain PA.

## 2017-07-24 NOTE — Telephone Encounter (Signed)
Oral Oncology Patient Advocate Encounter  Received a message that the patient had not been able to obtain a full month's supply of Afinitor from [facility].  She had only been dispensed a 14 day supply.    The initial prior authorizaton request was submitted for a full 28 day supply and this request was approved on 07/05/2017.   I followed up with the insurance company who informed me that it was her individual plan that had placed this 14 day per fill restriction on the claim.  I have filed an appeal with the account manager for the plan to remove this restriction so that she can receive a full month's supply with each fill.  I have asked them to expedite this request and was told that I should hear a response from them tomorrow 07/25/2017.   I have updated Sonia Hill.  She is going to request a refill from [facility] today so that she won't run out of medication.  I will keep her updated once I hear back from the insurance company.   [name]. [name], [place] Patient [facility] [phone] 07/24/2017 3:35 PM

## 2017-07-27 NOTE — Progress Notes (Signed)
No results found for: CA125ID: LILIAUNA SANTONI   DOB: 07-15-1955  MR#: [id number]  FAO#:[id number]  Patient Care Team: [name], MD as PCP - General (Family Medicine) Magrinat, Virgie Dad, MD as Consulting Physician (Oncology) Molli Posey, MD as Consulting Physician (Obstetrics and Gynecology) Laurence Spates, MD as Consulting Physician (Gastroenterology) Leighton Ruff, MD as Consulting Physician (General Surgery) Delrae Rend, MD as Consulting Physician (Endocrinology) Laureen Abrahams, RN as Registered Nurse (Oncology) Clarene Essex, MD as Consulting Physician (Gastroenterology)   CHIEF COMPLAINT: Metastatic breast cancer, estrogen receptor positive  CURRENT TREATMENT: exemestane, everolimus; refuses bisphosphonates or denosumab  INTERVAL HISTORY: Quianna returns today for follow-up of her 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 She continues on exemestane, with good tolerance. She denies issues from this medication.  She also receives everolimus, with good tolerance. She initially paid $20 for 14 day supply. She notes that she has a discount medication card that allowed her to get the medication for free. She denies issues from this medication and specifically has had no mucositis or cough  Since her last visit, she completed an abdominal x-ray on 07/14/2017 showing: Similar findings worrisome for small bowel obstruction. Interval removal of enteric tube.  REVIEW OF SYSTEMS: Anaika reports that she continues to lose weight. She notes hat she saw her dietician. She notes that she has been adding in dairy and more protein, but she had more constipation. She denies having distention and abdominal pain. She notes that she continues to eat chicken bone broth to add calories. She notes that she is trying to eat 6 small meals during the day instead of larger meals. She notes that she isn't getting enough calories, and she wasn't aware that she  needed to add fats to her diet. She notes that her husband got her flowers for Valentine's Day, but she would have enjoyed some dark chocolate. She notes that last night, she had pain in her left upper quadrant and soreness in her left breast. She notes that it felt like reflux, and she couldn't sleep well. She notes that her blood pressure was higher today. She notes that her first time in the hospital was scary for her. She notes that she still occasionally needs zofran and Miralax to prevent nausea and constipation. She denies unusual headaches, visual changes, nausea, vomiting, or dizziness. There has been no unusual cough, phlegm production, or pleurisy. This been no change in bowel or bladder habits. She denies unexplained fatigue or unexplained weight loss, bleeding, rash, or fever. A detailed review of systems was otherwise stable.    BREAST CANCER HISTORY:  From the initial intake note:  Rayme had screening mammography on 11/10/2009 showing very dense tissue with a possible distortion in the left breast.  Digital mammography on June 10th showed a 1.5 cm cluster of microcalcifications in the lower inner portion of the breast.  On physical exam, there was no palpable mass and no palpable axillary adenopathy.  There was some nipple inversion inferolaterally.  Ultrasound showed a 2.6 cm irregular mass-like area with dense posterior acoustic shadowing in the 3 o'clock position, 1 cm from the nipple, but normal appearing left axillary lymph nodes.    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  A second mass noted on ultrasound was also lobular, also low grade.  Both were ER and PR positive at  well over 90%.  Both had low proliferation markers at 9%, and both were Her-2 negative by CISH with ratios of 1.6 and 1.24.  In short, both masses were nearly  identical.    On June 16th, the patient had bilateral breast MRIs which showed in the left breast at 3 o'clock an irregular area of abnormal enhancement measuring up to 2.7 cm and in the left lower inner quadrant, a second post-biopsy area measuring 1.5 cm.  The difference between these two areas was stated at the conference the morning of the visit to be approximately 5 cm.  Given this data the patient opted for Left mastectomy with sentinel lymph node sampling, performed July 2011 with results as detailed below.  Subsequent history is as detailed below.    PAST MEDICAL HISTORY: Past Medical History:  Diagnosis Date  . Arthritis    neck, upper back, shoulders - no meds - yoga  . Chronic kidney disease    Only has right kidney  . Depression    Hx - no current problem  . GERD (gastroesophageal reflux disease)    diet controlled - no meds  . Hyperlipidemia    diet controlled  . Hypothyroidism   . lt breast ca dx'd 11/2009  . Osteoporosis, unspecified 12/06/2013  . PONV (postoperative nausea and vomiting)   . SVD (spontaneous vaginal delivery)    x 1  1. Osteopenia. 2. Congenital left renal atrophy.  3. History of recurrent UTIs. 4. History of bladder reconstruction for reflux more than 18 years ago. 5. History of depression and anxiety. 6. History of hypothyroidism.  7. History of "sling procedure".  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    FAMILY HISTORY The patient's father is alive in his early 1s.  He has Alzheimer's disease. The patient's mother was diagnosed with breast cancer at the age of 38.  She died at the age of 39.  The patient's father had one out of two sisters with breast cancer.  There are other breast cancers on the mother's side.  The patient has one brother and one younger sister, neither with cancer.    GYNECOLOGIC HISTORY:  (Reviewed 12/06/2013) She is GX P1.  That pregnancy was at age 62.   Menarche was at age 12.  She was perimenopausal at the time of diagnosis but is now convincingly postmenopausal  SOCIAL HISTORY:  (Reviewed 12/06/2013) She works as a Electrical engineer, part-time and on consultation.  Her husband, [name], is a Architect at [employer].  [name] works as a Pharmacist, hospital in [place].  The patient's son [name] is  in college.  The patient attends [employer].   ADVANCED DIRECTIVES: in place  HEALTH MAINTENANCE: (Updated 12/06/2013)  Social History   Tobacco Use  . Smoking status: Never Smoker  . Smokeless tobacco: Never Used  Substance Use Topics  . Alcohol use: No  . Drug use: No     Colonoscopy: 06/18/2015; [facility]  PAP: UTD/May 2015, Dr. Matthew Saras  Bone density: [name] 2014, osteoporosis  Lipid panel: UTD (Not on file)    Allergies  Allergen Reactions  . Adhesive [Tape] Rash    Ok to use paper tape and tegaderm over IV site  . Betadine [Povidone Iodine] Rash  . Mercury Rash    Reaction mercurachrome  . Sulfa Antibiotics Rash    Current Outpatient Medications  Medication Sig Dispense Refill  . ALPRAZolam (XANAX) 0.25 MG tablet  Take 1 tablet (0.25 mg total) by mouth every 8 (eight) hours as needed for anxiety or sleep. 15 tablet 0  . baclofen (LIORESAL) 20 MG tablet Take 1 tablet (20 mg total) by mouth 3 (three) times daily. 60 each 1  . cholecalciferol (D-VI-SOL) 400 UNIT/ML LIQD Take 400 Units by mouth daily.    Marland Kitchen everolimus (AFINITOR) 5 MG tablet Take 1 tablet (5 mg total) by mouth daily. Take with a full glass of water, consistently with or without food. 30 tablet 12  . exemestane (AROMASIN) 25 MG tablet Take 1 tablet (25 mg total) by mouth daily after breakfast. 90 tablet 4  . gabapentin (NEURONTIN) 300 MG capsule Take 1 capsule (300 mg total) by mouth 3 (three) times daily as needed. (Patient taking differently: Take 300 mg by mouth 3 (three) times daily as needed (nerve pain). ) 90 capsule 1  . ibuprofen  (ADVIL,MOTRIN) 200 MG tablet Take 200 mg by mouth every 6 (six) hours as needed for moderate pain.    Marland Kitchen ondansetron (ZOFRAN ODT) 4 MG disintegrating tablet Take 1 tablet (4 mg total) by mouth every 4 (four) hours as needed for nausea or vomiting. 20 tablet 3  . ondansetron (ZOFRAN-ODT) 8 MG disintegrating tablet Take 1 tablet (8 mg total) by mouth every 8 (eight) hours as needed for nausea or vomiting. 40 tablet 1  . polyethylene glycol (MIRALAX / GLYCOLAX) packet Take 17 g by mouth daily.    . promethazine (PHENERGAN) 25 MG suppository Place 1 suppository (25 mg total) rectally every 6 (six) hours as needed for nausea or vomiting. 12 each 0  . simethicone (MYLICON) 701 MG chewable tablet Chew 125 mg by mouth every 6 (six) hours as needed for flatulence.    . Thyroid (NATURE-THROID) 81.25 MG TABS Take 81.25 mg by mouth daily.    . traMADol (ULTRAM) 50 MG tablet Take 1 tablet (50 mg total) by mouth every 6 (six) hours as needed. 15 tablet 0   No current facility-administered medications for this visit.     OBJECTIVE: Middle aged white woman who appears stated age  88:   07/28/17 1135  BP: (!) 162/79  Pulse: 91  Resp: 18  Temp: 98.3 F (36.8 C)     Body mass index is 17.01 kg/m.    ECOG FS: 1 Filed Weights   07/28/17 1135  Weight: 90 lb (40.8 kg)    Sclerae unicteric, EOMs intact Oropharynx clear and moist No cervical or supraclavicular adenopathy Lungs no rales or rhonchi Heart regular rate and rhythm Abd soft, nontender, positive bowel sounds MSK no focal spinal tenderness, no upper extremity lymphedema Neuro: nonfocal, well oriented, appropriate affect Breasts: Deferred   LAB RESULTS:   No results found for: CA125 Lab Results  Component Value Date   LABCA2 181 (H) 10/23/2015  No results found for: CEA    Lab Results  Component Value Date   WBC 3.7 (L) 07/28/2017   NEUTROABS 2.7 07/28/2017   HGB 12.4 07/28/2017   HCT 36.9 07/28/2017   MCV 88.9 07/28/2017    PLT 245 07/28/2017      Chemistry      Component Value Date/Time   NA 133 (L) 07/14/2017 0447   NA 134 (L) 12/28/2016 1503   K 3.7 07/14/2017 0447   K 4.0 12/28/2016 1503   CL 101 07/14/2017 0447   CL 98 12/03/2012 1419   CO2 25 07/14/2017 0447   CO2 25 12/28/2016 1503   BUN <5 (L) 07/14/2017  0447   BUN 12.0 12/28/2016 1503   CREATININE 0.60 07/14/2017 0447   CREATININE 0.8 12/28/2016 1503      Component Value Date/Time   CALCIUM 9.2 07/14/2017 0447   CALCIUM 9.7 12/28/2016 1503   ALKPHOS 70 07/10/2017 1314   ALKPHOS 76 12/28/2016 1503   AST 26 07/10/2017 1314   AST 22 12/28/2016 1503   ALT 15 07/10/2017 1314   ALT 20 12/28/2016 1503   BILITOT 0.5 07/10/2017 1314   BILITOT 0.50 12/28/2016 1503      STUDIES: Dg Abd 1 View  Result Date: 07/14/2017 CLINICAL DATA:  Small bowel obstruction EXAM: ABDOMEN - 1 VIEW COMPARISON:  07/13/2017; 07/12/2017; 07/10/2017; CT abdomen and pelvis - 07/10/2017 FINDINGS: Interval removal of enteric tube. Persistent gas distention of multiple loops of large and small bowel with index loop of small bowel within the left upper abdominal quadrant measuring approximately 3.8 cm in diameter, similar to the 1/31 examination. Enteric contrast is again seen within the cecum and ascending colon, grossly unchanged. No supine evidence of pneumoperitoneum. No pneumatosis or portal venous gas. No definitive abnormal intra-abdominal calcifications. Limited visualization of the lower thorax is normal. No acute osseus abnormalities. IMPRESSION: 1. Similar findings worrisome for small bowel obstruction. 2. Interval removal of enteric tube. Electronically Signed   By: Sandi Mariscal M.D.   On: 07/14/2017 07:48   Dg Abd 1 View  Result Date: 07/12/2017 CLINICAL DATA:  Small bowel obstruction. EXAM: ABDOMEN - 1 VIEW COMPARISON:  Radiograph of July 11, 2017. FINDINGS: Distal tip of nasogastric tube is seen in the stomach. No colonic dilatation is noted. Dilated small  bowel loops are again noted concerning for distal small bowel obstruction. Contrast is noted in the large bowel. IMPRESSION: Stable dilated small bowel loops are noted concerning for distal small bowel obstruction. Electronically Signed   By: Marijo Conception, M.D.   On: 07/12/2017 08:13   Ct Abdomen Pelvis W Contrast  Result Date: 07/10/2017 CLINICAL DATA:  History of breast cancer and rectal cancer. Abdominal pain and bloating. EXAM: CT ABDOMEN AND PELVIS WITH CONTRAST TECHNIQUE: Multidetector CT imaging of the abdomen and pelvis was performed using the standard protocol following bolus administration of intravenous contrast. CONTRAST:  80 cc  ISOVUE-300 IOPAMIDOL (ISOVUE-300) INJECTION 61% COMPARISON:  CT scan 05/22/2017 FINDINGS: Lower chest: The lung bases are clear. The heart is normal in size. No pericardial effusion. Hepatobiliary: No focal hepatic lesions to suggest metastatic disease. The gallbladder appears normal. No common bile duct dilatation. Pancreas: No mass, inflammation or ductal dilatation. Spleen: Normal size.  No focal lesions. Adrenals/Urinary Tract: Adrenal glands are normal. The left kidney is surgically absent. The right kidney demonstrates compensatory hypertrophy and moderate right-sided hydroureteronephrosis. Stomach/Bowel: The stomach, duodenum and small bowel are dilated and demonstrates scattered air-fluid levels. There is a transition to decompressed small bowel loops in the right lower quadrant. Findings consistent with a mid distal small bowel obstruction. Contrast in the colon is likely from the previous small bowel follow-through from 07/03/2017. Stable left lower quadrant colostomy. Large infiltrating lower rectal mass which appears to involve the anus, presacral soft tissues and the uterus. Vascular/Lymphatic: The major vascular structures are patent. Small scattered mesenteric and retroperitoneal lymph nodes are stable. Reproductive: The uterus appears to be involved with  rectal tumor. Other: There is a curvilinear radiodensity in the right pelvis which was not present on the prior study. It may be a fractured catheter tip or prior ureteral stent. Recommend correlation with clinical history.  Musculoskeletal: No significant bony findings. IMPRESSION: 1. Large fungating/infiltrating rectal mass, relatively stable. 2. Mid distal small bowel obstruction. 3. Right-sided hydroureteronephrosis relatively stable when compared to prior study. 4. Curvilinear radiodensity in the right pelvis possibly a fractured/retained catheter tip. Electronically Signed   By: Marijo Sanes M.D.   On: 07/10/2017 16:21   Dg Small Bowel  Result Date: 07/03/2017 CLINICAL DATA:  Metastatic breast cancer with rectal metastases. Diverting colostomy. Small bowel obstruction. EXAM: SMALL BOWEL SERIES COMPARISON:  CT 05/22/2017.  Radiographs 06/19/2017. TECHNIQUE: Following ingestion of thin barium, serial small bowel images were obtained including spot views of the terminal ileum. FLUOROSCOPY TIME:  Fluoroscopy Time: 1 min and 6 sec of low-dose pulsed fluoro Radiation Exposure Index (if provided by the fluoroscopic device): 60 mGy Number of Acquired Spot Images: 0 FINDINGS: The scout abdominal radiograph demonstrates improvement in the previously demonstrated small bowel dilatation. No significant dilated loops of small bowel are seen. There is stool in the right colon. Left-sided colostomy noted. The patient drank the water-soluble contrast without difficulty. Overhead abdominal radiographs were obtained at 10 and 30 min supplemented by fluoroscopic observation and spot images. The stomach and proximal small bowel appear normal. Contrast reaches the colon within 30 min of initial ingestion. The small bowel appears foreshortened, suggesting previous partial small bowel resection. There are few mildly dilated loops of small bowel in the right mid abdomen. At fluoroscopy, these are not fixed. There prominent small  bowel peristalsis at fluoroscopy, recorded on series 3 and series 5. The terminal ileum was difficult to isolate, although appears grossly normal. No contrast extravasation. IMPRESSION: 1. Rapid transit of contrast through the small bowel, reaching the colon within 30 min of initial ingestion. No evidence of bowel obstruction. 2. There are few transiently dilated loops of small bowel and prominent peristalsis at fluoroscopy. Electronically Signed   By: Richardean Sale M.D.   On: 07/03/2017 12:37   Dg Abd 2 Views  Result Date: 07/10/2017 CLINICAL DATA:  Bloating. Abdominal pain and nausea. Colostomy in November. Metastatic breast cancer. EXAM: ABDOMEN - 2 VIEW COMPARISON:  07/03/2017 small bowel follow-through. FINDINGS: Upright and supine views. The upright view demonstrates no free intraperitoneal air. Surgical clips about the left lower chest. Air-fluid levels which could be within large or small bowel loops. Supine image demonstrates gaseous distension of small bowel including at 5.1 cm. IMPRESSION: Development of moderate small bowel dilatation with bowel air-fluid levels. Suspicious for distal small bowel or colonic obstruction. Adynamic ileus is felt less likely. No free intraperitoneal air or other acute complication. Electronically Signed   By: Abigail Miyamoto M.D.   On: 07/10/2017 10:51   Dg Abd Portable 1v  Result Date: 07/13/2017 CLINICAL DATA:  62 year old female with small bowel obstruction. Rectal mass. Colostomy. EXAM: PORTABLE ABDOMEN - 1 VIEW COMPARISON:  07/12/2017 and earlier. FINDINGS: Portable AP supine view at 0446 hr. Stable enteric tube, side hole up the level of the proximal stomach. Stable and negative visible lung bases. Gas-filled small bowel loops remain dilated between 30-40 centimeters diameter throughout much of the abdomen. Decreased volume of retained contrast in the proximal colon. Left abdominal ostomy redemonstrated. Stable visualized osseous structures. IMPRESSION: 1.  Stable enteric tube. 2. No significant improvement in partial small bowel obstruction pattern since 07/10/2017. 3. Decreasing volume of retained oral contrast in the proximal colon. Electronically Signed   By: Genevie Ann M.D.   On: 07/13/2017 08:44   Dg Abd Portable 1v-small Bowel Obstruction Protocol-initial, 8 Hr Delay  Result Date: 07/11/2017 CLINICAL DATA:  Small bowel obstruction. EXAM: PORTABLE ABDOMEN - 1 VIEW COMPARISON:  07/10/2017 FINDINGS: There is a nasogastric tube with the tip projecting over the body of the stomach. There is persistent small bowel dilatation measuring up to 4 cm. There is oral contrast material which has transited into the colon. There is no evidence of pneumoperitoneum, portal venous gas or pneumatosis. There are no pathologic calcifications along the expected course of the ureters. The osseous structures are unremarkable. IMPRESSION: 1. Nasogastric tube with the tip projecting over the body of the stomach. 2. Small bowel dilatation measuring up to 4 cm with oral contrast material which has transited into the colon. Findings are suggestive of a partial small bowel obstruction. Electronically Signed   By: Kathreen Devoid   On: 07/11/2017 08:14   Dg Abd Portable 1v-small Bowel Protocol-position Verification  Result Date: 07/10/2017 CLINICAL DATA:  STAT READ. eval for NG tube placement for start of small bowel protocol. abd pain. Nausea for several days. EXAM: PORTABLE ABDOMEN - 1 VIEW COMPARISON:  CT 07/10/2017 FINDINGS: Nasogastric tube is in place, tip overlying the level of stomach. There is persistent significant dilatation of small bowel loops, greater than dilatation of large bowel loops and consistent with small bowel obstruction. Contrast is identified within the colon and urinary bladder following CT earlier. IMPRESSION: Nasogastric tube to the region of the stomach. Small bowel obstruction. Electronically Signed   By: Nolon Nations M.D.   On: 07/10/2017 19:03      ASSESSMENT: 62 y.o.  BRCA 1-2 negative [facility] woman with lobular breast cancer stage IV at presentation July 2011  (1) status post left mastectomy and sentinel lymph node sampling in July 2011 for a lower inner quadrant T1 N1 M1, stage IV invasive lobular breast cancer, grade 1, strongly estrogen and progesterone receptor-positive, HER2 negative with MIB-1 of 9% and no HER2 amplification,   (2) with multiple sclerotic bone lesions at presentation seen only on CT scan (not on bone scan or PET scan), but  with biopsy-proven metastatic disease to bone; and an elevated CA 27.29 at presentation,   (3) Oncotype recurrence score of 4, predicts a good response to antiestrogens.  (4) Systemic treatment has consisted of  a) tamoxifen with evidence of response but poor tolerance  b) letrozole starting August 2012, discontinued October 2014 per patient   (5) single functioning kidney  (6) status post bilateral salpingo-oophorectomy 01/24/2013, with benign pathology  (7) osteoporosis; the patient refuses bisphosphonate therapy; started [facility] February 2014.    (a) bone density was obtained under the care of Dr. Matthew Saras at [facility], 06/25/2012, showing osteoporosis with a T score -2.6. This was repeated 12/20/2013, showing again osteoporosis with T-scores between -2.4 and -2.8.  (b) the patient refuses zolendronate, denosumab, or other pharmacologic intervention  (c) bone density on 12/30/2015 under Dr. Matthew Saras showing osteoporosis with T scores between -2.7 and -3.3  (8) the patient refuses standard Mammography or tomography; undergoing thermography screening of the right breast.  (9) PET scan 06/05/2015 shows rectal thickening and a presacral mass; biopsy of this area 10/09/2015 confirms metastatic lobular breast cancer, again estrogen receptor positive, HER-2 nonamplified.  (a) pelvic MRI 03/11/2016 confirms stability of disease  (b) chest CT and pelvic MRI  09/02/2016 shows no change in the circumferential rectal thickening or evidence of extension beyond the serosa and chest CT findings  (c) colonoscopy 02/23/2017 showed a very narrow rectal lumen with a few apparently uninvolved centimeters  distally   (d) CT of the abdomen and pelvis 05/22/2017 (after diverting colostomy) shows interval increase in the size of the perirectal soft tissue masses.  (10) started fulvestrant and palbociclib 125 mg/ day [21/7] May 2017  (a) palbociclib dose decreased to 100 mg daily [21/7] with second cycle, started 11/25/2015  (b) palbociclib discontinued 03/20/2017 with evidence of disease progression  (c) last fulvestrant dose 03/24/2017, discontinued with evidence of progression  (11) status post colostomy placement at [facility] November 2018  (12) started exemestane 07/05/2017, everolimus to follow  PLAN:  Cheyann finally appears to be stabilizing.  She has had no further episodes of bowel obstruction.  She occasionally has symptoms, for example of the discomfort in her left upper quadrant that she has at times, and her first impulse is to think that this may be due to medication.  We discussed the fact that she is very likely to continue to have abdominal problems related to her surgery, scar tissue, and the fact that she has cancer in her abdomen.  The everolimus and exemestane would not be expected to cause her that localized discomfort.  She now does identify reflux when it occurs and she knows how to deal with it.    The one fly in the ointment is that she is continuing to lose weight.  She has been focusing on protein and doing a very good job of that but not calories.  Today we discussed how she can most easily increase the calories in her food and I gave her a recipe for chocolate trough falls which are incredibly rich and have no fiber.  Hopefully when she returns to see me in 4-5 weeks she will have gained a few pounds.  She is aware that  everolimus has been associated with an increase in the blood sugar.  That has not happened  She knows to call for any issues that may develop before the next visit.  The plan at this point is to continue exemestane and everolimus and restage after 3 months.  Of course were going to be following her markers monthly.   Magrinat, Virgie Dad, MD  07/28/17 12:08 PM Medical Oncology and Hematology [facility] [address] [place], [place] [address] Tel. [phone]    Fax. [phone]  This document serves as a record of services personally performed by Lurline Del, MD. It was created on his behalf by Sheron Nightingale, a trained medical scribe. The creation of this record is based on the scribe's personal observations and the provider's statements to them.   I have reviewed the above documentation for accuracy and completeness, and I agree with the above.

## 2017-07-28 ENCOUNTER — Inpatient Hospital Stay: Payer: 59 | Attending: Oncology | Admitting: Oncology

## 2017-07-28 ENCOUNTER — Ambulatory Visit: Payer: 59 | Admitting: Nutrition

## 2017-07-28 ENCOUNTER — Telehealth: Payer: Self-pay | Admitting: Oncology

## 2017-07-28 ENCOUNTER — Inpatient Hospital Stay: Payer: 59

## 2017-07-28 VITALS — BP 162/79 | HR 91 | Temp 98.3°F | Resp 18 | Ht 61.0 in | Wt 90.0 lb

## 2017-07-28 DIAGNOSIS — K5900 Constipation, unspecified: Secondary | ICD-10-CM

## 2017-07-28 DIAGNOSIS — Z79811 Long term (current) use of aromatase inhibitors: Secondary | ICD-10-CM

## 2017-07-28 DIAGNOSIS — C50312 Malignant neoplasm of lower-inner quadrant of left female breast: Secondary | ICD-10-CM

## 2017-07-28 DIAGNOSIS — M810 Age-related osteoporosis without current pathological fracture: Secondary | ICD-10-CM | POA: Diagnosis not present

## 2017-07-28 DIAGNOSIS — Z79899 Other long term (current) drug therapy: Secondary | ICD-10-CM | POA: Diagnosis not present

## 2017-07-28 DIAGNOSIS — Z933 Colostomy status: Secondary | ICD-10-CM | POA: Diagnosis not present

## 2017-07-28 DIAGNOSIS — K56609 Unspecified intestinal obstruction, unspecified as to partial versus complete obstruction: Secondary | ICD-10-CM | POA: Diagnosis not present

## 2017-07-28 DIAGNOSIS — K219 Gastro-esophageal reflux disease without esophagitis: Secondary | ICD-10-CM

## 2017-07-28 DIAGNOSIS — F329 Major depressive disorder, single episode, unspecified: Secondary | ICD-10-CM | POA: Insufficient documentation

## 2017-07-28 DIAGNOSIS — N1330 Unspecified hydronephrosis: Secondary | ICD-10-CM | POA: Diagnosis not present

## 2017-07-28 DIAGNOSIS — M129 Arthropathy, unspecified: Secondary | ICD-10-CM | POA: Diagnosis not present

## 2017-07-28 DIAGNOSIS — C7951 Secondary malignant neoplasm of bone: Secondary | ICD-10-CM | POA: Diagnosis not present

## 2017-07-28 DIAGNOSIS — E785 Hyperlipidemia, unspecified: Secondary | ICD-10-CM | POA: Insufficient documentation

## 2017-07-28 DIAGNOSIS — E039 Hypothyroidism, unspecified: Secondary | ICD-10-CM | POA: Insufficient documentation

## 2017-07-28 DIAGNOSIS — Z87440 Personal history of urinary (tract) infections: Secondary | ICD-10-CM

## 2017-07-28 DIAGNOSIS — Z803 Family history of malignant neoplasm of breast: Secondary | ICD-10-CM | POA: Insufficient documentation

## 2017-07-28 DIAGNOSIS — I10 Essential (primary) hypertension: Secondary | ICD-10-CM | POA: Diagnosis not present

## 2017-07-28 DIAGNOSIS — Z905 Acquired absence of kidney: Secondary | ICD-10-CM | POA: Insufficient documentation

## 2017-07-28 DIAGNOSIS — Z170 Estrogen receptor positive status [ER+]: Secondary | ICD-10-CM | POA: Insufficient documentation

## 2017-07-28 DIAGNOSIS — C50919 Malignant neoplasm of unspecified site of unspecified female breast: Secondary | ICD-10-CM

## 2017-07-28 LAB — CBC WITH DIFFERENTIAL/PLATELET
BASOS ABS: 0 10*3/uL (ref 0.0–0.1)
BASOS PCT: 0 %
EOS ABS: 0 10*3/uL (ref 0.0–0.5)
EOS PCT: 1 %
HCT: 36.9 % (ref 34.8–46.6)
HEMOGLOBIN: 12.4 g/dL (ref 11.6–15.9)
Lymphocytes Relative: 16 %
Lymphs Abs: 0.6 10*3/uL — ABNORMAL LOW (ref 0.9–3.3)
MCH: 29.9 pg (ref 25.1–34.0)
MCHC: 33.6 g/dL (ref 31.5–36.0)
MCV: 88.9 fL (ref 79.5–101.0)
Monocytes Absolute: 0.3 10*3/uL (ref 0.1–0.9)
Monocytes Relative: 9 %
NEUTROS PCT: 74 %
Neutro Abs: 2.7 10*3/uL (ref 1.5–6.5)
PLATELETS: 245 10*3/uL (ref 145–400)
RBC: 4.15 MIL/uL (ref 3.70–5.45)
RDW: 12.5 % (ref 11.2–14.5)
WBC: 3.7 10*3/uL — ABNORMAL LOW (ref 3.9–10.3)

## 2017-07-28 LAB — COMPREHENSIVE METABOLIC PANEL
ALT: 12 U/L (ref 0–55)
AST: 27 U/L (ref 5–34)
Albumin: 3.8 g/dL (ref 3.5–5.0)
Alkaline Phosphatase: 72 U/L (ref 40–150)
Anion gap: 12 — ABNORMAL HIGH (ref 3–11)
BILIRUBIN TOTAL: 0.5 mg/dL (ref 0.2–1.2)
BUN: 10 mg/dL (ref 7–26)
CO2: 23 mmol/L (ref 22–29)
CREATININE: 0.8 mg/dL (ref 0.60–1.10)
Calcium: 9.9 mg/dL (ref 8.4–10.4)
Chloride: 96 mmol/L — ABNORMAL LOW (ref 98–109)
GFR calc non Af Amer: >60 mL/min (ref >60)
Glucose, Bld: 83 mg/dL (ref 70–140)
Potassium: 3.8 mmol/L (ref 3.5–5.1)
Sodium: 131 mmol/L — ABNORMAL LOW (ref 136–145)
TOTAL PROTEIN: 7.1 g/dL (ref 6.4–8.3)

## 2017-07-28 NOTE — Progress Notes (Signed)
This encounter was created in error - please disregard.

## 2017-07-28 NOTE — Progress Notes (Signed)
Nutrition follow-up completed with patient and her husband. Weight has decreased significantly and was documented as 91.2 pounds February 15 down from 104.2 pounds January 28 Patient reports she has added dairy back into her diet however she has a sensitivity to dairy and this caused her to have constipation. Patient has been focusing on adequate protein but has not been meeting calorie goal, accounting for weight loss. Patient is trying to avoid fiber-containing foods. She is confused about what she can eat on a full liquid diet versus a low fiber diet. Patient has a very limited diet so it is difficult to work in additional calories and protein.  Nutrition diagnosis:  Food nutrition related knowledge deficit related to metastatic breast cancer as evidenced by multiple questions regarding appropriate diet.  Intervention: I educated patient on the importance of clarifying diet order from physician.  Full liquid versus low fiber. I educated her again on importance of calories and protein. Provided examples of appropriate nutrition supplements that would be included on either diet. Provided fact sheets on both diets so she can follow the diet prescribed by MD.  Monitoring evaluation goals: Patient will tolerate increased calories and protein to minimize further weight loss  Next visit: To be scheduled as needed.  **Disclaimer: This note was dictated with voice recognition software. Similar sounding words can inadvertently be transcribed and this note may contain transcription errors which may not have been corrected upon publication of note.**

## 2017-07-28 NOTE — Telephone Encounter (Signed)
Gave avs and calendar for march °

## 2017-07-29 LAB — CANCER ANTIGEN 27.29: CA 27.29: 220.3 U/mL — ABNORMAL HIGH (ref 0.0–38.6)

## 2017-08-01 ENCOUNTER — Other Ambulatory Visit: Payer: Self-pay

## 2017-08-01 MED ORDER — OSELTAMIVIR PHOSPHATE 75 MG PO CAPS: 75.0000 mg | ORAL_CAPSULE | Freq: Two times a day (BID) | ORAL | 0 refills | Status: DC

## 2017-08-03 NOTE — Telephone Encounter (Signed)
Oral Oncology Patient Advocate Encounter  Followed up with the patient's insurance plan on the request to remove the 14 tablet/fill limit on the patient's Afinitor.   The representative did inform me that as of 08-05-2017, the patient will be able to receive 28 tablets per fill.    I have spoken with Mrs Macy.  She will call to refill her medication on the 23rd and reach out to me if there are any problems.    [name]. [name], [place] [facility] [place] [phone] 08/03/2017 3:10 PM

## 2017-08-07 ENCOUNTER — Telehealth: Payer: Self-pay | Admitting: *Deleted

## 2017-08-07 ENCOUNTER — Other Ambulatory Visit: Payer: Self-pay | Admitting: *Deleted

## 2017-08-07 MED ORDER — ALPRAZOLAM 0.25 MG PO TABS: 0.2500 mg | ORAL_TABLET | Freq: Three times a day (TID) | ORAL | 0 refills | Status: DC | PRN

## 2017-08-07 NOTE — Telephone Encounter (Signed)
This RN received VM from pt stating she is getting a request for payment of the additional 14 day supply ( ideally should have been a 28 day supply ) from the speciality pharmacy of $50.  She states she has " this Nurse, children's - is that what I use- I do not understand? "   " and do I need to pay for the additional 14 tabs ?"  This message will be forwarded to the Oral Pharmacy team for contact with pt for her concerns.

## 2017-08-10 ENCOUNTER — Telehealth: Payer: Self-pay | Admitting: Pharmacy Technician

## 2017-08-10 NOTE — Telephone Encounter (Signed)
Oral Oncology Patient Advocate Encounter  Spoke with Sonia Hill.  I explained that the copay card for her Afinitor is processed at the pharmacy in the same way that her insurance is processed.  They would not have sent a pre-paid card to pay for her copayments. She expressed understanding.     Additionally, she stated that the pharmacy was charging her the full $50 copayment in spite of my providing them with copay card billing information 2 weeks ago.  She is going to reach out to the pharmacy and see if she can resolve the issue.  I gave her permission to have the pharmacy reach out to me if there were any issues processing her copay card.    Sonia Hill me for my help and has my direct dial should any other insurance/copay issues arise.    [name]. [name], [place] [facility] [phone] 08/10/2017 3:23 PM

## 2017-08-10 NOTE — Telephone Encounter (Signed)
Oral Oncology Patient Advocate Encounter  Attempted to reach patient to discuss her questions about copay assistance for Afinitor.  She left a voicemail that the had received a pre-paid credit card in the mail.   I am unsure if that is related to the Afinitor copay card.  I am happy to help figure it out.  I have left her my direct # [phone]).   [name]. [name], [place] [facility] [place] [phone] 08/10/2017 2:33 PM

## 2017-08-22 ENCOUNTER — Inpatient Hospital Stay: Payer: 59

## 2017-08-22 ENCOUNTER — Inpatient Hospital Stay: Payer: 59 | Attending: Oncology | Admitting: Oncology

## 2017-08-22 ENCOUNTER — Telehealth: Payer: Self-pay | Admitting: Oncology

## 2017-08-22 VITALS — BP 149/86 | HR 94 | Temp 98.4°F | Resp 18 | Ht 61.0 in | Wt 91.3 lb

## 2017-08-22 DIAGNOSIS — Z803 Family history of malignant neoplasm of breast: Secondary | ICD-10-CM | POA: Insufficient documentation

## 2017-08-22 DIAGNOSIS — E039 Hypothyroidism, unspecified: Secondary | ICD-10-CM | POA: Diagnosis not present

## 2017-08-22 DIAGNOSIS — E785 Hyperlipidemia, unspecified: Secondary | ICD-10-CM | POA: Insufficient documentation

## 2017-08-22 DIAGNOSIS — Z90710 Acquired absence of both cervix and uterus: Secondary | ICD-10-CM | POA: Insufficient documentation

## 2017-08-22 DIAGNOSIS — C785 Secondary malignant neoplasm of large intestine and rectum: Secondary | ICD-10-CM | POA: Insufficient documentation

## 2017-08-22 DIAGNOSIS — N189 Chronic kidney disease, unspecified: Secondary | ICD-10-CM | POA: Diagnosis not present

## 2017-08-22 DIAGNOSIS — C50919 Malignant neoplasm of unspecified site of unspecified female breast: Secondary | ICD-10-CM

## 2017-08-22 DIAGNOSIS — Z87440 Personal history of urinary (tract) infections: Secondary | ICD-10-CM | POA: Diagnosis not present

## 2017-08-22 DIAGNOSIS — M818 Other osteoporosis without current pathological fracture: Secondary | ICD-10-CM

## 2017-08-22 DIAGNOSIS — C7951 Secondary malignant neoplasm of bone: Principal | ICD-10-CM

## 2017-08-22 DIAGNOSIS — Z79811 Long term (current) use of aromatase inhibitors: Secondary | ICD-10-CM

## 2017-08-22 DIAGNOSIS — E46 Unspecified protein-calorie malnutrition: Secondary | ICD-10-CM

## 2017-08-22 DIAGNOSIS — Z170 Estrogen receptor positive status [ER+]: Secondary | ICD-10-CM | POA: Insufficient documentation

## 2017-08-22 DIAGNOSIS — Z79899 Other long term (current) drug therapy: Secondary | ICD-10-CM | POA: Diagnosis not present

## 2017-08-22 DIAGNOSIS — M129 Arthropathy, unspecified: Secondary | ICD-10-CM | POA: Diagnosis not present

## 2017-08-22 DIAGNOSIS — Z9013 Acquired absence of bilateral breasts and nipples: Secondary | ICD-10-CM | POA: Insufficient documentation

## 2017-08-22 DIAGNOSIS — Z1379 Encounter for other screening for genetic and chromosomal anomalies: Secondary | ICD-10-CM

## 2017-08-22 DIAGNOSIS — Z905 Acquired absence of kidney: Secondary | ICD-10-CM | POA: Insufficient documentation

## 2017-08-22 DIAGNOSIS — Z79810 Long term (current) use of selective estrogen receptor modulators (SERMs): Secondary | ICD-10-CM | POA: Insufficient documentation

## 2017-08-22 DIAGNOSIS — E440 Moderate protein-calorie malnutrition: Secondary | ICD-10-CM

## 2017-08-22 DIAGNOSIS — K219 Gastro-esophageal reflux disease without esophagitis: Secondary | ICD-10-CM | POA: Insufficient documentation

## 2017-08-22 DIAGNOSIS — K56609 Unspecified intestinal obstruction, unspecified as to partial versus complete obstruction: Secondary | ICD-10-CM

## 2017-08-22 DIAGNOSIS — Z933 Colostomy status: Secondary | ICD-10-CM | POA: Insufficient documentation

## 2017-08-22 DIAGNOSIS — R21 Rash and other nonspecific skin eruption: Secondary | ICD-10-CM | POA: Insufficient documentation

## 2017-08-22 DIAGNOSIS — C50912 Malignant neoplasm of unspecified site of left female breast: Secondary | ICD-10-CM

## 2017-08-22 DIAGNOSIS — M810 Age-related osteoporosis without current pathological fracture: Secondary | ICD-10-CM | POA: Insufficient documentation

## 2017-08-22 DIAGNOSIS — Z90722 Acquired absence of ovaries, bilateral: Secondary | ICD-10-CM | POA: Insufficient documentation

## 2017-08-22 DIAGNOSIS — F329 Major depressive disorder, single episode, unspecified: Secondary | ICD-10-CM | POA: Diagnosis not present

## 2017-08-22 DIAGNOSIS — C50312 Malignant neoplasm of lower-inner quadrant of left female breast: Secondary | ICD-10-CM | POA: Diagnosis present

## 2017-08-22 DIAGNOSIS — Z85828 Personal history of other malignant neoplasm of skin: Secondary | ICD-10-CM | POA: Insufficient documentation

## 2017-08-22 LAB — CBC WITH DIFFERENTIAL/PLATELET
Basophils Absolute: 0 K/uL (ref 0.0–0.1)
Basophils Relative: 1 %
Eosinophils Absolute: 0.2 K/uL (ref 0.0–0.5)
Eosinophils Relative: 4 %
HCT: 35.3 % (ref 34.8–46.6)
Hemoglobin: 11.5 g/dL — ABNORMAL LOW (ref 11.6–15.9)
Lymphocytes Relative: 17 %
Lymphs Abs: 0.8 K/uL — ABNORMAL LOW (ref 0.9–3.3)
MCH: 28.4 pg (ref 25.1–34.0)
MCHC: 32.7 g/dL (ref 31.5–36.0)
MCV: 86.9 fL (ref 79.5–101.0)
Monocytes Absolute: 0.4 K/uL (ref 0.1–0.9)
Monocytes Relative: 9 %
Neutro Abs: 3.1 K/uL (ref 1.5–6.5)
Neutrophils Relative %: 69 %
Platelets: 243 K/uL (ref 145–400)
RBC: 4.06 MIL/uL (ref 3.70–5.45)
RDW: 13.7 % (ref 11.2–14.5)
WBC: 4.4 K/uL (ref 3.9–10.3)

## 2017-08-22 LAB — COMPREHENSIVE METABOLIC PANEL WITH GFR
ALT: 15 U/L (ref 0–55)
AST: 23 U/L (ref 5–34)
Albumin: 3.6 g/dL (ref 3.5–5.0)
Alkaline Phosphatase: 76 U/L (ref 40–150)
Anion gap: 7 (ref 3–11)
BUN: 12 mg/dL (ref 7–26)
CO2: 26 mmol/L (ref 22–29)
Calcium: 10 mg/dL (ref 8.4–10.4)
Chloride: 105 mmol/L (ref 98–109)
Creatinine, Ser: 0.83 mg/dL (ref 0.60–1.10)
GFR calc Af Amer: >60 mL/min
GFR calc non Af Amer: >60 mL/min
Glucose, Bld: 97 mg/dL (ref 70–140)
Potassium: 4.3 mmol/L (ref 3.5–5.1)
Sodium: 138 mmol/L (ref 136–145)
Total Bilirubin: 0.5 mg/dL (ref 0.2–1.2)
Total Protein: 6.8 g/dL (ref 6.4–8.3)

## 2017-08-22 NOTE — Telephone Encounter (Signed)
Gave patient AVS and calendar of upcoming April appointments.  °

## 2017-08-22 NOTE — Progress Notes (Signed)
No results found for: CA125ID: Sonia Hill   DOB: [birth year]  MR#: [id number]  EGB#:[id number]  Patient Care Team: [name], MD as PCP - General (Family Medicine) Magrinat, Virgie Dad, MD as Consulting Physician (Oncology) Molli Posey, MD as Consulting Physician (Obstetrics and Gynecology) Laurence Spates, MD as Consulting Physician (Gastroenterology) Leighton Ruff, MD as Consulting Physician (General Surgery) Delrae Rend, MD as Consulting Physician (Endocrinology) Laureen Abrahams, RN as Registered Nurse (Oncology) Clarene Essex, MD as Consulting Physician (Gastroenterology)   CHIEF COMPLAINT: Metastatic breast cancer, estrogen receptor positive  CURRENT TREATMENT: exemestane, [everolimus]; refuses bisphosphonates or denosumab  INTERVAL HISTORY: Sonia Hill returns today for follow-up of her 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 She continues on exemestane, with good tolerance. She denies having issues with hot flashes.   She discontinued taking everolimus on 08/20/2017 due to a severe itching and burning rash on her back and spine that extends down to her waist. She scratches throughout the night, and wakes up with blood on her sheets. She used to have dairy allergies, but she attributes the rash to this medication. She used lavender oils to aid, but this yielded little relief.     REVIEW OF SYSTEMS: Sonia Hill reports that she is enjoying her chocolate moose recipe. She is using the Fitness Pal app to track her caloric intake. She is trying to get at least 1,600 calories per day. She has an increase in appetite. Due to having the flu, she lost 5 lbs, but she has gained back 4 lbs within the last 2 weeks. She denies oral sores. She notes that her rectal pain has subsided. Her colostomy bag is working well, and she empties her bag about 3 times per day with her bowels being the consistency of pudding. She takes Miralax everyday and drinks  about half her body weight in ounces of water. She denies unusual headaches, visual changes, nausea, vomiting, or dizziness. There has been no unusual cough, phlegm production, or pleurisy. This been no change in bowel or bladder habits. She denies unexplained fatigue or unexplained weight loss, bleeding, rash, or fever. A detailed review of systems was otherwise stable.    BREAST CANCER HISTORY:  From the initial intake note:  Sonia Hill had screening mammography on 11/10/2009 showing very dense tissue with a possible distortion in the left breast.  Digital mammography on June 10th showed a 1.5 cm cluster of microcalcifications in the lower inner portion of the breast.  On physical exam, there was no palpable mass and no palpable axillary adenopathy.  There was some nipple inversion inferolaterally.  Ultrasound showed a 2.6 cm irregular mass-like area with dense posterior acoustic shadowing in the 3 o'clock position, 1 cm from the nipple, but normal appearing left axillary lymph nodes.    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  A second mass noted on ultrasound was also lobular, also low grade.  Both were ER and PR positive at well over 90%.  Both had low proliferation markers at 9%, and both were Her-2 negative by CISH with ratios of 1.6 and 1.24.  In short, both masses were nearly identical.    On June 16th, the patient had bilateral breast MRIs which showed in the left breast at 3 o'clock an irregular area of abnormal enhancement measuring up to 2.7 cm and in the left lower inner quadrant,  a second post-biopsy area measuring 1.5 cm.  The difference between these two areas was stated at the conference the morning of the visit to be approximately 5 cm.  Given this data the patient opted for Left mastectomy with sentinel lymph node sampling, performed  July 2011 with results as detailed below.  Subsequent history is as detailed below.    PAST MEDICAL HISTORY: Past Medical History:  Diagnosis Date  . Arthritis    neck, upper back, shoulders - no meds - yoga  . Chronic kidney disease    Only has right kidney  . Depression    Hx - no current problem  . GERD (gastroesophageal reflux disease)    diet controlled - no meds  . Hyperlipidemia    diet controlled  . Hypothyroidism   . lt breast ca dx'd 11/2009  . Osteoporosis, unspecified 12/06/2013  . PONV (postoperative nausea and vomiting)   . SVD (spontaneous vaginal delivery)    x 1  1. Osteopenia. 2. Congenital left renal atrophy.  3. History of recurrent UTIs. 4. History of bladder reconstruction for reflux more than 18 years ago. 5. History of depression and anxiety. 6. History of hypothyroidism.  7. History of "sling procedure".  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    FAMILY HISTORY The patient's father is alive in his early 20s.  He has Alzheimer's disease. The patient's mother was diagnosed with breast cancer at the age of 82.  She died at the age of 80.  The patient's father had one out of two sisters with breast cancer.  There are other breast cancers on the mother's side.  The patient has one brother and one younger sister, neither with cancer.    GYNECOLOGIC HISTORY:  (Reviewed 12/06/2013) She is GX P1.  That pregnancy was at age 30.  Menarche was at age 60.  She was perimenopausal at the time of diagnosis but is now convincingly postmenopausal  SOCIAL HISTORY:  (Reviewed 12/06/2013) She works as a Electrical engineer, part-time and on consultation.  Her husband, [name], is a Architect at [employer].  [name] works as a Pharmacist, hospital in [place].  The patient's son [name] is  in college.  The patient attends [employer].   ADVANCED DIRECTIVES: in place  HEALTH MAINTENANCE: (Updated  12/06/2013)  Social History   Tobacco Use  . Smoking status: Never Smoker  . Smokeless tobacco: Never Used  Substance Use Topics  . Alcohol use: No  . Drug use: No     Colonoscopy: 06/18/2015; [facility]  PAP: UTD/May 2015, Dr. Matthew Saras  Bone density: [name] 2014, osteoporosis  Lipid panel: UTD (Not on file)    Allergies  Allergen Reactions  . Adhesive [Tape] Rash    Ok to use paper tape and tegaderm over IV site  . Betadine [Povidone Iodine] Rash  . Mercury Rash    Reaction mercurachrome  . Sulfa Antibiotics Rash    Current Outpatient Medications  Medication Sig Dispense Refill  . ALPRAZolam (XANAX) 0.25 MG tablet Take 1 tablet (0.25 mg total) by mouth every 8 (eight) hours as needed for anxiety or sleep. 15 tablet 0  . baclofen (LIORESAL) 20 MG tablet Take 1 tablet (20 mg total) by mouth 3 (three) times daily. 60 each 1  . cholecalciferol (D-VI-SOL) 400 UNIT/ML LIQD Take 400 Units by mouth daily.    Marland Kitchen everolimus (AFINITOR) 5 MG tablet Take 1 tablet (5 mg total)  by mouth daily. Take with a full glass of water, consistently with or without food. 30 tablet 12  . exemestane (AROMASIN) 25 MG tablet Take 1 tablet (25 mg total) by mouth daily after breakfast. 90 tablet 4  . gabapentin (NEURONTIN) 300 MG capsule Take 1 capsule (300 mg total) by mouth 3 (three) times daily as needed. (Patient taking differently: Take 300 mg by mouth 3 (three) times daily as needed (nerve pain). ) 90 capsule 1  . ibuprofen (ADVIL,MOTRIN) 200 MG tablet Take 200 mg by mouth every 6 (six) hours as needed for moderate pain.    Marland Kitchen ondansetron (ZOFRAN ODT) 4 MG disintegrating tablet Take 1 tablet (4 mg total) by mouth every 4 (four) hours as needed for nausea or vomiting. 20 tablet 3  . ondansetron (ZOFRAN-ODT) 8 MG disintegrating tablet Take 1 tablet (8 mg total) by mouth every 8 (eight) hours as needed for nausea or vomiting. 40 tablet 1  . oseltamivir (TAMIFLU) 75 MG capsule Take 1 capsule (75 mg total)  by mouth 2 (two) times daily. 10 capsule 0  . polyethylene glycol (MIRALAX / GLYCOLAX) packet Take 17 g by mouth daily.    . promethazine (PHENERGAN) 25 MG suppository Place 1 suppository (25 mg total) rectally every 6 (six) hours as needed for nausea or vomiting. 12 each 0  . simethicone (MYLICON) 734 MG chewable tablet Chew 125 mg by mouth every 6 (six) hours as needed for flatulence.    . Thyroid (NATURE-THROID) 81.25 MG TABS Take 81.25 mg by mouth daily.    . traMADol (ULTRAM) 50 MG tablet Take 1 tablet (50 mg total) by mouth every 6 (six) hours as needed. 15 tablet 0   No current facility-administered medications for this visit.     OBJECTIVE: Middle aged white woman in no acute distress  Vitals:   08/22/17 1443  BP: (!) 149/86  Pulse: 94  Resp: 18  Temp: 98.4 F (36.9 C)     Body mass index is 17.25 kg/m.    ECOG FS: 1 Filed Weights   08/22/17 1443  Weight: 91 lb 4.8 oz (41.4 kg)  Note that her weight had decreased to 85 pounds when she had "the flu" recently.  She has gained about 4 pounds in the last week or so  Sclerae unicteric, pupils round and equal No cervical or supraclavicular adenopathy Lungs no rales or rhonchi Heart regular rate and rhythm Abd soft, nontender, positive bowel sounds, colostomy in place MSK no focal spinal tenderness, no upper extremity lymphedema Neuro: nonfocal, well oriented, appropriate affect Breasts: Deferred   LAB RESULTS:   No results found for: CA125 Lab Results  Component Value Date   LABCA2 181 (H) 10/23/2015  No results found for: CEA    Lab Results  Component Value Date   WBC 4.4 08/22/2017   NEUTROABS 3.1 08/22/2017   HGB 11.5 (L) 08/22/2017   HCT 35.3 08/22/2017   MCV 86.9 08/22/2017   PLT 243 08/22/2017      Chemistry      Component Value Date/Time   NA 138 08/22/2017 1433   NA 134 (L) 12/28/2016 1503   K 4.3 08/22/2017 1433   K 4.0 12/28/2016 1503   CL 105 08/22/2017 1433   CL 98 12/03/2012 1419   CO2  26 08/22/2017 1433   CO2 25 12/28/2016 1503   BUN 12 08/22/2017 1433   BUN 12.0 12/28/2016 1503   CREATININE 0.83 08/22/2017 1433   CREATININE 0.8 12/28/2016 1503  Component Value Date/Time   CALCIUM 10.0 08/22/2017 1433   CALCIUM 9.7 12/28/2016 1503   ALKPHOS 76 08/22/2017 1433   ALKPHOS 76 12/28/2016 1503   AST 23 08/22/2017 1433   AST 22 12/28/2016 1503   ALT 15 08/22/2017 1433   ALT 20 12/28/2016 1503   BILITOT 0.5 08/22/2017 1433   BILITOT 0.50 12/28/2016 1503      STUDIES: No results found.   ASSESSMENT: 62 y.o.  BRCA 1-2 negative [facility] woman with lobular breast cancer stage IV at presentation July 2011  (1) status post left mastectomy and sentinel lymph node sampling in July 2011 for a lower inner quadrant T1 N1 M1, stage IV invasive lobular breast cancer, grade 1, strongly estrogen and progesterone receptor-positive, HER2 negative with MIB-1 of 9% and no HER2 amplification,   (2) with multiple sclerotic bone lesions at presentation seen only on CT scan (not on bone scan or PET scan), but  with biopsy-proven metastatic disease to bone; and an elevated CA 27.29 at presentation,   (3) Oncotype recurrence score of 4, predicts a good response to antiestrogens.  (4) Systemic treatment has consisted of  a) tamoxifen with evidence of response but poor tolerance  b) letrozole starting August 2012, discontinued October 2014 per patient   (5) single functioning kidney  (6) status post bilateral salpingo-oophorectomy 01/24/2013, with benign pathology  (7) osteoporosis; the patient refuses bisphosphonate therapy; started [facility] February 2014.    (a) bone density was obtained under the care of Dr. Matthew Saras at [facility], 06/25/2012, showing osteoporosis with a T score -2.6. This was repeated 12/20/2013, showing again osteoporosis with T-scores between -2.4 and -2.8.  (b) the patient refuses zolendronate, denosumab, or other pharmacologic  intervention  (c) bone density on 12/30/2015 under Dr. Matthew Saras showing osteoporosis with T scores between -2.7 and -3.3  (8) the patient refuses standard Mammography or tomography; undergoing thermography screening of the right breast.  (9) PET scan 06/05/2015 shows rectal thickening and a presacral mass; biopsy of this area 10/09/2015 confirms metastatic lobular breast cancer, again estrogen receptor positive, HER-2 nonamplified.  (a) pelvic MRI 03/11/2016 confirms stability of disease  (b) chest CT and pelvic MRI 09/02/2016 shows no change in the circumferential rectal thickening or evidence of extension beyond the serosa and chest CT findings  (c) colonoscopy 02/23/2017 showed a very narrow rectal lumen with a few apparently uninvolved centimeters distally   (d) CT of the abdomen and pelvis 05/22/2017 (after diverting colostomy) shows interval increase in the size of the perirectal soft tissue masses.  (10) started fulvestrant and palbociclib 125 mg/ day [21/7] May 2017  (a) palbociclib dose decreased to 100 mg daily [21/7] with second cycle, started 11/25/2015  (b) palbociclib discontinued 03/20/2017 with evidence of disease progression  (c) last fulvestrant dose 03/24/2017, discontinued with evidence of progression  (11) status [facility] November 2018  (12) started exemestane 07/05/2017,  (a) everolimus 5 mg/d started 07/14/2017--held as of 08/20/2017 secondary to rash  PLAN:  Sonia Hill looks and feels considerably better.  Her colostomy is finally working well.  She is having "pudding like" bowel movements 2 or 3 times a day.  She is eating 1600 cal daily and tolerating dairy and chocolate quite well.  She is finally beginning to gain weight.  She also has more energy.  Importantly there is severe pain in the rectal area that she was experiencing is now essentially gone.  That and the fact that  her CA-27-29 has stabilized as of the last reading  suggests that the exemestane/everolimus combination was working for her  Unfortunately she has developed a rash which is intolerably itchy and she had to discontinue the medication.  As far as the rash is concerned I expect it will improve over the next several days.  She is continuing on exemestane.  However I am concerned that that may not be enough to hold the cancer.  We may want to bring back to the everolimus at a lower dose, perhaps 2.5 mg daily when she returns to see me in a month.  In addition to the treatment she is receiving here she is receiving treatments through her [facility].  We do not get copies of their interventions so I am not sure what is being done there but she gets treatments 3 times a week through their office, she tells me.  I am delighted that she is better.  She will see me again in a month.  We will repeat a tumor marker a few days before the visit so we have that information and that should help [place] decide whether or not to try to make a second try on the everolimus  Magrinat, Virgie Dad, MD  08/22/17 5:14 PM Medical Oncology and Hematology [facility] [place], [place] [address] Tel. [phone]    Fax. [phone]  This document serves as a record of services personally performed by Lurline Del, MD. It was created on his behalf by Sheron Nightingale, a trained medical scribe. The creation of this record is based on the scribe's personal observations and the provider's statements to them.   See meds

## 2017-08-23 LAB — CANCER ANTIGEN 27.29: CA 27.29: 220.5 U/mL — ABNORMAL HIGH (ref 0.0–38.6)

## 2017-08-28 ENCOUNTER — Other Ambulatory Visit: Payer: Self-pay | Admitting: *Deleted

## 2017-08-28 ENCOUNTER — Inpatient Hospital Stay: Payer: 59

## 2017-08-28 ENCOUNTER — Other Ambulatory Visit: Payer: Self-pay | Admitting: Oncology

## 2017-08-28 ENCOUNTER — Telehealth: Payer: Self-pay | Admitting: *Deleted

## 2017-08-28 ENCOUNTER — Other Ambulatory Visit: Payer: 59

## 2017-08-28 DIAGNOSIS — E440 Moderate protein-calorie malnutrition: Secondary | ICD-10-CM

## 2017-08-28 DIAGNOSIS — R109 Unspecified abdominal pain: Secondary | ICD-10-CM

## 2017-08-28 DIAGNOSIS — R350 Frequency of micturition: Secondary | ICD-10-CM

## 2017-08-28 DIAGNOSIS — R10A1 Flank pain, right side: Secondary | ICD-10-CM

## 2017-08-28 DIAGNOSIS — M818 Other osteoporosis without current pathological fracture: Secondary | ICD-10-CM

## 2017-08-28 DIAGNOSIS — C7951 Secondary malignant neoplasm of bone: Secondary | ICD-10-CM

## 2017-08-28 DIAGNOSIS — C50912 Malignant neoplasm of unspecified site of left female breast: Secondary | ICD-10-CM

## 2017-08-28 DIAGNOSIS — Z170 Estrogen receptor positive status [ER+]: Secondary | ICD-10-CM

## 2017-08-28 DIAGNOSIS — C50312 Malignant neoplasm of lower-inner quadrant of left female breast: Secondary | ICD-10-CM | POA: Diagnosis not present

## 2017-08-28 DIAGNOSIS — K56609 Unspecified intestinal obstruction, unspecified as to partial versus complete obstruction: Secondary | ICD-10-CM

## 2017-08-28 DIAGNOSIS — M810 Age-related osteoporosis without current pathological fracture: Secondary | ICD-10-CM

## 2017-08-28 LAB — URINALYSIS, COMPLETE (UACMP) WITH MICROSCOPIC
Bacteria, UA: NONE SEEN
Bilirubin Urine: NEGATIVE
GLUCOSE, UA: NEGATIVE mg/dL
Hgb urine dipstick: NEGATIVE
Ketones, ur: NEGATIVE mg/dL
Leukocytes, UA: NEGATIVE
Nitrite: NEGATIVE
PH: 7 (ref 5.0–8.0)
Protein, ur: NEGATIVE mg/dL
RBC / HPF: NONE SEEN RBC/hpf (ref 0–5)
Specific Gravity, Urine: 1.006 (ref 1.005–1.030)
Squamous Epithelial / LPF: NONE SEEN
WBC, UA: NONE SEEN WBC/hpf (ref 0–5)

## 2017-08-28 NOTE — Telephone Encounter (Signed)
This RN spoke with pt per her VM stating onset over the weekend of " low right back pain and increased urination ".  Sonia Hill states pain is " low - but ongoing " , she denies seeing any blood in her urine ( which she looked for due to prior UTI's ) of note Sonia Hill stated concern due to " I have only one kidney so just concerned "  She states " I did bend over yesterday and pain seemed to increase momentarily so am not sure if I pulled a muscle or it is the urine ?"  Above discussed including being seen in the [facility] or just obtaining labs for UTI. Sonia Hill would prefer to have labs only due to " I know how to manage the symptoms - I have appropriate pain meds in my home "  Plan at present is for pt to come in for U/A with C&S- this RN will call pt with results ( due to now urine is sent out and results take over an hour to obtain.)

## 2017-08-29 LAB — URINE CULTURE

## 2017-09-12 ENCOUNTER — Encounter: Payer: 59 | Admitting: Medical

## 2017-09-12 ENCOUNTER — Telehealth: Payer: Self-pay

## 2017-09-12 ENCOUNTER — Other Ambulatory Visit: Payer: Self-pay | Admitting: Medical

## 2017-09-12 DIAGNOSIS — R600 Localized edema: Secondary | ICD-10-CM

## 2017-09-12 NOTE — Telephone Encounter (Signed)
Pt rescheduled her appt with symptom management due to weather - snowing and not letting up in [place].  She will come at 10 am in morning instead, notified [place].  Pt reports left leg is the same, advised if worsens she would have to call 911 if she is unable to drive due to weather and pt said she feels it is ok.

## 2017-09-12 NOTE — Telephone Encounter (Signed)
Received VM from pt regarding left leg swelling.  Called pt back, she describes left leg swelling as tight, from calf down to ankle, painful, and throbbing.  Denies redness or excessive warmth of area. She said similar problem in Fall 2018 and her doppler was negative for blood clot.  Spoke with Sandi Mealy PA and pt agreed to come in at 2 pm today for further eval.

## 2017-09-13 ENCOUNTER — Inpatient Hospital Stay: Payer: 59 | Attending: Oncology | Admitting: Medical

## 2017-09-13 ENCOUNTER — Inpatient Hospital Stay: Payer: 59 | Admitting: Medical

## 2017-09-13 VITALS — BP 129/72 | HR 84 | Temp 98.0°F | Resp 18 | Ht 61.0 in | Wt 95.5 lb

## 2017-09-13 DIAGNOSIS — Z905 Acquired absence of kidney: Secondary | ICD-10-CM | POA: Insufficient documentation

## 2017-09-13 DIAGNOSIS — R350 Frequency of micturition: Secondary | ICD-10-CM | POA: Diagnosis not present

## 2017-09-13 DIAGNOSIS — E785 Hyperlipidemia, unspecified: Secondary | ICD-10-CM | POA: Diagnosis not present

## 2017-09-13 DIAGNOSIS — C50912 Malignant neoplasm of unspecified site of left female breast: Secondary | ICD-10-CM

## 2017-09-13 DIAGNOSIS — E039 Hypothyroidism, unspecified: Secondary | ICD-10-CM | POA: Diagnosis not present

## 2017-09-13 DIAGNOSIS — K219 Gastro-esophageal reflux disease without esophagitis: Secondary | ICD-10-CM

## 2017-09-13 DIAGNOSIS — R609 Edema, unspecified: Secondary | ICD-10-CM

## 2017-09-13 DIAGNOSIS — K6289 Other specified diseases of anus and rectum: Secondary | ICD-10-CM | POA: Diagnosis not present

## 2017-09-13 DIAGNOSIS — R600 Localized edema: Secondary | ICD-10-CM

## 2017-09-13 DIAGNOSIS — Z803 Family history of malignant neoplasm of breast: Secondary | ICD-10-CM | POA: Insufficient documentation

## 2017-09-13 DIAGNOSIS — C7951 Secondary malignant neoplasm of bone: Secondary | ICD-10-CM | POA: Diagnosis not present

## 2017-09-13 DIAGNOSIS — C50312 Malignant neoplasm of lower-inner quadrant of left female breast: Secondary | ICD-10-CM | POA: Diagnosis present

## 2017-09-13 DIAGNOSIS — M810 Age-related osteoporosis without current pathological fracture: Secondary | ICD-10-CM | POA: Insufficient documentation

## 2017-09-13 DIAGNOSIS — Z170 Estrogen receptor positive status [ER+]: Secondary | ICD-10-CM | POA: Diagnosis not present

## 2017-09-13 LAB — CBC WITH DIFFERENTIAL (CANCER CENTER ONLY)
BASOS PCT: 0 %
Basophils Absolute: 0 10*3/uL (ref 0.0–0.1)
EOS ABS: 0.1 10*3/uL (ref 0.0–0.5)
Eosinophils Relative: 1 %
HCT: 38.2 % (ref 34.8–46.6)
HEMOGLOBIN: 12.5 g/dL (ref 11.6–15.9)
LYMPHS ABS: 0.9 10*3/uL (ref 0.9–3.3)
Lymphocytes Relative: 14 %
MCH: 28.4 pg (ref 25.1–34.0)
MCHC: 32.7 g/dL (ref 31.5–36.0)
MCV: 86.9 fL (ref 79.5–101.0)
MONOS PCT: 5 %
Monocytes Absolute: 0.3 10*3/uL (ref 0.1–0.9)
NEUTROS PCT: 80 %
Neutro Abs: 5.4 10*3/uL (ref 1.5–6.5)
Platelet Count: 372 10*3/uL (ref 145–400)
RBC: 4.4 MIL/uL (ref 3.70–5.45)
RDW: 15.1 % — ABNORMAL HIGH (ref 11.2–14.5)
WBC: 6.7 10*3/uL (ref 3.9–10.3)

## 2017-09-13 LAB — CMP (CANCER CENTER ONLY)
ALBUMIN: 3.8 g/dL (ref 3.5–5.0)
ALK PHOS: 82 U/L (ref 40–150)
ALT: 12 U/L (ref 0–55)
AST: 25 U/L (ref 5–34)
Anion gap: 9 (ref 3–11)
BILIRUBIN TOTAL: 0.6 mg/dL (ref 0.2–1.2)
BUN: 12 mg/dL (ref 7–26)
CALCIUM: 10 mg/dL (ref 8.4–10.4)
CO2: 27 mmol/L (ref 22–29)
CREATININE: 0.81 mg/dL (ref 0.60–1.10)
Chloride: 99 mmol/L (ref 98–109)
GFR, Est AFR Am: >60 mL/min (ref >60)
GFR, Estimated: >60 mL/min (ref >60)
Glucose, Bld: 79 mg/dL (ref 70–140)
Potassium: 4.5 mmol/L (ref 3.5–5.1)
SODIUM: 135 mmol/L — AB (ref 136–145)
TOTAL PROTEIN: 7 g/dL (ref 6.4–8.3)

## 2017-09-13 LAB — URINALYSIS, COMPLETE (UACMP) WITH MICROSCOPIC
Bilirubin Urine: NEGATIVE
Glucose, UA: NEGATIVE mg/dL
Hgb urine dipstick: NEGATIVE
KETONES UR: NEGATIVE mg/dL
Leukocytes, UA: NEGATIVE
Nitrite: NEGATIVE
PH: 8 (ref 5.0–8.0)
Protein, ur: NEGATIVE mg/dL
RBC / HPF: NONE SEEN RBC/hpf (ref 0–5)
SQUAMOUS EPITHELIAL / LPF: NONE SEEN
Specific Gravity, Urine: 1.005 (ref 1.005–1.030)

## 2017-09-13 NOTE — Progress Notes (Signed)
These results were called to Jeanann Lewandowsky.  No answer.  A message was left for the patient.  She was instructed to call should she have questions.  Sandi Mealy, PA-C

## 2017-09-13 NOTE — Progress Notes (Signed)
Pt reports swelling and tenderness in L leg without redness/heat.  Non-pitting, mild edema L leg observed.  Full ROM and sensation.  Pedal pulse palpable.

## 2017-09-13 NOTE — Patient Instructions (Signed)
How to Use Compression Stockings Compression stockings are elastic socks that squeeze the legs. They help to increase blood flow to the legs, decrease swelling in the legs, and reduce the chance of developing blood clots in the lower legs. Compression stockings are often used by people who:  Are recovering from surgery.  Have poor circulation in their legs.  Are prone to getting blood clots in their legs.  Have varicose veins.  Sit or stay in bed for long periods of time.  How to use compression stockings Before you put on your compression stockings:  Make sure that they are the correct size. If you do not know your size, ask your health care provider.  Make sure that they are clean, dry, and in good condition.  Check them for rips and tears. Do not put them on if they are ripped or torn.  Put your stockings on first thing in the morning, before you get out of bed. Keep them on for as long as your health care provider advises. When you are wearing your stockings:  Keep them as smooth as possible. Do not allow them to bunch up. It is especially important to prevent the stockings from bunching up around your toes or behind your knees.  Do not roll the stockings downward and leave them rolled down. This can decrease blood flow to your leg.  Change them right away if they become wet or dirty.  When you take off your stockings, inspect your legs and feet. Anything that does not seem normal may require medical attention. Look for:  Open sores.  Red spots.  Swelling.  Information and tips  Do not stop wearing your compression stockings without talking to your health care provider first.  Wash your stockings every day with mild detergent in cold or warm water. Do not use bleach. Air-dry your stockings or dry them in a clothes dryer on low heat.  Replace your stockings every 3-6 months.  If skin moisturizing is part of your treatment plan, apply lotion or cream at night so that  your skin will be dry when you put on the stockings in the morning. It is harder to put the stockings on when you have lotion on your legs or feet. Contact a health care provider if: Remove your stockings and seek medical care if:  You have a feeling of pins and needles in your feet or legs.  You have any new changes in your skin.  You have skin lesions that are getting worse.  You have swelling or pain that is getting worse.  Get help right away if:  You have numbness or tingling in your lower legs that does not get better right after you take the stockings off.  Your toes or feet become cold and blue.  You develop open sores or red spots on your legs that do not go away.  You see or feel a warm spot on your leg.  You have new swelling or soreness in your leg.  You are short of breath or you have chest pain for no reason.  You have a rapid or irregular heartbeat.  You feel light-headed or dizzy. This information is not intended to replace advice given to you by your health care provider. Make sure you discuss any questions you have with your health care provider. Document Released: 03/27/2009 Document Revised: 10/28/2015 Document Reviewed: 05/07/2014 Elsevier Interactive Patient Education  2018 [employer]  

## 2017-09-14 LAB — URINE CULTURE: Culture: <10000 — AB

## 2017-09-14 LAB — CANCER ANTIGEN 27.29: CAN 27.29: 385.5 U/mL — AB (ref 0.0–38.6)

## 2017-09-15 NOTE — Progress Notes (Signed)
[facility] Progress Note   Sonia Hill [id number] 1955-12-14 62 y.o.  Sonia Hill is managed by Dr. [name]  Actively treated with chemotherapy: no  Current Therapy: Exemestane, the patient has stopped this medication due to a possible reaction.  She plans to stay off this medication until she is seen by Dr. [name] in follow-up.  She will see him on 09/25/2017.   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  She plans to stay off of exemestane until she is seen by Dr. [name] in follow-up.  She will see him on 09/25/2017.  Her labs are collected today.  These included a chemistry panel, CBC and CA-27-29.  Urinary frequency: A urinalysis, CBC, conference of chemistry panel and urine culture were collected today.  These results did not show any evidence of a urinary tract infection.  A urine culture is pending.  Bilateral lower extremity edema greater on the left than right per the patient's report: I have reassured the patient today that there is no clinical evidence of a DVT.  I have recommended that she begin a trial of thigh-high compression stockings for her episodic lower extremity edema.  She and her husband will be flying to [place] to visit family.  I encouraged her to use compression stockings while she is flying.  She is agreeable to begin using compression stockings.  Please see After Visit Summary for patient specific instructions.  Future Appointments  Date Time Provider  [place]  09/25/2017  4:00 PM Magrinat, Virgie Dad, MD [facility] None    Orders Placed This Encounter  Procedures  . Urine Culture  . Urinalysis, Complete w Microscopic  . CMP ([place] only)  . CA 27.29  . CBC with Differential ([facility] Only)       Subjective:   Patient ID:  Sonia Hill is a 62 y.o. (DOB 05-Feb-1956) female.  Chief Complaint:  Chief Complaint  Patient presents with  . Edema    HPI Sonia Hill is a 62 year old female with a diagnosis of an ER positive metastatic breast cancer with bony metastatic disease.  She is managed by Dr. [name] at and has most recently been treated with exemestane.  She is recently stopped exemestane due to a perceived reaction to the drug.  She has a history of a left nephrectomy many years ago due to reflux.  She was having recurrent infections.  She presents today with a report of right flank pain with frequency of urination last evening.  She has had lower extremity edema especially over her feet and ankles with this being greater on the left lower extremity than right lower extremity.  She is previously had a Doppler ultrasound completed which returned negative for DVT.  She and her husband will be flying to [place] this summer to visit relatives.  She is scheduled to see Dr. [name] on 10/25/2017 and would like to have her labs collected today.  Medications: I have reviewed the patient's current medications.  Allergies:  Allergies  Allergen Reactions  .  Adhesive [Tape] Rash    Ok to use paper tape and tegaderm over IV site  . Betadine [Povidone Iodine] Rash  . Mercury Rash    Reaction mercurachrome  . Sulfa Antibiotics Rash    Past Medical History:  Diagnosis Date  . Arthritis    neck, upper back, shoulders - no meds - yoga  . Chronic kidney disease    Only has right kidney  . Depression    Hx - no current problem  . GERD (gastroesophageal reflux disease)    diet controlled - no meds  .  Hyperlipidemia    diet controlled  . Hypothyroidism   . lt breast ca dx'd 11/2009  . Osteoporosis, unspecified 12/06/2013  . PONV (postoperative nausea and vomiting)   . SVD (spontaneous vaginal delivery)    x 1    Past Surgical History:  Procedure Laterality Date  . BLADDER NECK RECONSTRUCTION     made a new urethea tube  . bladder tact  2008  . BREAST SURGERY     masectomy left breast  . coloscopy    . COLOSTOMY    . DILATION AND CURETTAGE OF UTERUS    . LAPAROSCOPY N/A 01/24/2013   Procedure: LAPROSCOPY OPERATIVE;  Surgeon: Margarette Asal, MD;  Location: [facility];  Service: Gynecology;  Laterality: N/A;  . left kidney removed     left kidney dysfunction  . SALPINGOOPHORECTOMY Bilateral 01/24/2013   Procedure: SALPINGO OOPHORECTOMY;  Surgeon: Margarette Asal, MD;  Location: [facility];  Service: Gynecology;  Laterality: Bilateral;  . WISDOM TOOTH EXTRACTION      Family History  Problem Relation Age of Onset  . Breast cancer Mother 66  . Breast cancer Maternal Aunt 58  . Breast cancer Other        MGF's sister dx with breast cancer in her 47s    Social History   Socioeconomic History  . Marital status: Married    Spouse name: Not on file  . Number of children: Not on file  . Years of education: Not on file  . Highest education level: Not on file  Occupational History  . Not on file  Social Needs  . Financial resource strain: Not on file  . Food insecurity:    Worry: Not on file    Inability: Not on file  . Transportation needs:    Medical: Not on file    Non-medical: Not on file  Tobacco Use  . Smoking status: Never Smoker  . Smokeless tobacco: Never Used  Substance and Sexual Activity  . Alcohol use: No  . Drug use: No  . Sexual activity: Yes    Birth control/protection: Post-menopausal  Lifestyle  . Physical activity:    Days per week: Not on file    Minutes per session: Not on file  . Stress: Not on file  Relationships  . Social connections:     Talks on phone: Not on file    Gets together: Not on file    Attends religious service: Not on file    Active member of club or organization: Not on file    Attends meetings of clubs or organizations: Not on file    Relationship status: Not on file  . Intimate partner violence:    Fear of current or ex partner: Not on file    Emotionally abused: Not on file    Physically abused: Not on file    Forced sexual activity: Not on file  Other Topics Concern  .  Not on file  Social History Narrative  . Not on file    Past Medical History, Surgical history, Social history, and Family history were reviewed and updated as appropriate.   Please see review of systems for further details on the patient's review from today.   Review of Systems:  Review of Systems  Constitutional: Negative for activity change, appetite change, chills, diaphoresis and fever.  HENT: Negative for trouble swallowing.   Respiratory: Negative for cough, choking, chest tightness, shortness of breath and wheezing.   Cardiovascular: Positive for leg swelling. Negative for chest pain.  Genitourinary: Positive for flank pain and frequency. Negative for difficulty urinating and dysuria.    Objective:   Physical Exam:  BP 129/72 (BP Location: Right Arm, Patient Position: Sitting)   Pulse 84   Temp 98 F (36.7 C) (Oral)   Resp 18   Ht 5\' 1"  (1.549 m)   Wt 95 lb 8 oz (43.3 kg)   SpO2 100%   BMI 18.04 kg/m  ECOG: 0  Physical Exam  Constitutional: No distress.  HENT:  Head: Normocephalic and atraumatic.  Cardiovascular: Normal rate, regular rhythm and normal heart sounds. Exam reveals no gallop and no friction rub.  No murmur heard. Pulses:      Dorsalis pedis pulses are 2+ on the right side, and 2+ on the left side.       Posterior tibial pulses are 1+ on the right side, and 1+ on the left side.  Pulmonary/Chest: Effort normal and breath sounds normal. No respiratory distress. She has no wheezes. She has no  rales.  Abdominal: Soft. Bowel sounds are normal. There is no tenderness.  Musculoskeletal: She exhibits no edema, tenderness or deformity.  Patient Is or equal bilaterally and measures 33.5 cm at 6 cm inferior to the tibial tuberosity.  Neurological: She is alert. Coordination normal.  Skin: Skin is warm and dry. She is not diaphoretic.  Psychiatric: She has a normal mood and affect. Her behavior is normal. Judgment and thought content normal.    Lab Review:     Component Value Date/Time   NA 135 (L) 09/13/2017 1157   NA 134 (L) 12/28/2016 1503   K 4.5 09/13/2017 1157   K 4.0 12/28/2016 1503   CL 99 09/13/2017 1157   CL 98 12/03/2012 1419   CO2 27 09/13/2017 1157   CO2 25 12/28/2016 1503   GLUCOSE 79 09/13/2017 1157   GLUCOSE 96 12/28/2016 1503   GLUCOSE 89 12/03/2012 1419   BUN 12 09/13/2017 1157   BUN 12.0 12/28/2016 1503   CREATININE 0.81 09/13/2017 1157   CREATININE 0.8 12/28/2016 1503   CALCIUM 10.0 09/13/2017 1157   CALCIUM 9.7 12/28/2016 1503   PROT 7.0 09/13/2017 1157   PROT 6.5 12/28/2016 1503   ALBUMIN 3.8 09/13/2017 1157   ALBUMIN 3.9 12/28/2016 1503   AST 25 09/13/2017 1157   AST 22 12/28/2016 1503   ALT 12 09/13/2017 1157   ALT 20 12/28/2016 1503   ALKPHOS 82 09/13/2017 1157   ALKPHOS 76 12/28/2016 1503   BILITOT 0.6 09/13/2017 1157   BILITOT 0.50 12/28/2016 1503   GFRNONAA >60 09/13/2017 1157   GFRAA >60 09/13/2017 1157       Component Value Date/Time   WBC 6.7 09/13/2017 1157   WBC 4.4 08/22/2017 1433   RBC 4.40 09/13/2017 1157   HGB 11.5 (L) 08/22/2017 1433   HGB 12.4 05/16/2017 0910   HCT 38.2 09/13/2017 1157   HCT 36.9 05/16/2017 0910  PLT 372 09/13/2017 1157   PLT 286 05/16/2017 0910   MCV 86.9 09/13/2017 1157   MCV 96.7 05/16/2017 0910   MCH 28.4 09/13/2017 1157   MCHC 32.7 09/13/2017 1157   RDW 15.1 (H) 09/13/2017 1157   RDW 13.0 05/16/2017 0910   LYMPHSABS 0.9 09/13/2017 1157   LYMPHSABS 0.9 05/16/2017 0910   MONOABS 0.3  09/13/2017 1157   MONOABS 0.4 05/16/2017 0910   EOSABS 0.1 09/13/2017 1157   EOSABS 0.3 05/16/2017 0910   BASOSABS 0.0 09/13/2017 1157   BASOSABS 0.0 05/16/2017 0910   -------------------------------  Imaging from last 24 hours (if applicable):  Radiology interpretation: No results found.

## 2017-09-18 ENCOUNTER — Ambulatory Visit (HOSPITAL_COMMUNITY)
Admission: RE | Admit: 2017-09-18 | Discharge: 2017-09-18 | Disposition: A | Discharge status: Home / Self Care | Payer: 59 | Source: Ambulatory Visit | Attending: Medical | Admitting: Medical

## 2017-09-18 DIAGNOSIS — R600 Localized edema: Secondary | ICD-10-CM | POA: Diagnosis present

## 2017-09-18 NOTE — Progress Notes (Signed)
Preliminary notes by tech--Left lower extremity venous duplex study completed. Negative for DVT. Result attempted to call the provider's phone but could not get hold with. Will e-fax the result.  Hongying Inri Sobieski(RDMS RVT) 09/18/17 11:47 AM

## 2017-09-19 ENCOUNTER — Other Ambulatory Visit: Payer: 59

## 2017-09-19 NOTE — Progress Notes (Signed)
No results found for: CA125ID: Sonia Hill   DOB: Dec 13, 1955  MR#: [id number]  GBT#:[id number]  Patient Care Team: [name], MD as PCP - General (Family Medicine) Belynda Pagaduan, Virgie Dad, MD as Consulting Physician (Oncology) Molli Posey, MD as Consulting Physician (Obstetrics and Gynecology) Laurence Spates, MD as Consulting Physician (Gastroenterology) Leighton Ruff, MD as Consulting Physician (General Surgery) Delrae Rend, MD as Consulting Physician (Endocrinology) Laureen Abrahams, RN as Registered Nurse (Oncology) Clarene Essex, MD as Consulting Physician (Gastroenterology)   CHIEF COMPLAINT: Metastatic breast cancer, estrogen receptor positive  CURRENT TREATMENT: On alternative medicines; refuses bisphosphonates or denosumab; also refuses chemo and radiation options  INTERVAL HISTORY: Sonia Hill returns today for follow-up of her 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 She discontinued  Exemestane about 3 weeks ago.  She decided she wanted to go "the natural route".  She takes Indolplex with DIM which naturally supports estrogen metabolism. She also takes Revivin, Angiostop, and other herbal supplements. She started taking these less than a month ago. This is being monitored with a [facility] in [place].  She brought [place] information regarding these agents and I am separately scanning that  REVIEW OF SYSTEMS: Sonia Hill reports that she having dysuria. She takes d-mannose to prevent urinary infections. She has not taken the d-mannose today, so she feels that this is why she has dysuria. She continues on a full liquid diet, and she is gaining more weight. She started eating a variety of soups with high calories. She is keeping her fiber low at about 12g per day. She tries to eat 6 meals per day that are high in fat, calories, and protein. She notes that it took a month for her skin rash to go away. She was given an eczema cream  by her dermatologist that helped. She denies unusual headaches, visual changes, nausea, vomiting, or dizziness. There has been no unusual cough, phlegm production, or pleurisy. This been no change in bowel or bladder habits. She denies unexplained fatigue or unexplained weight loss, bleeding, rash, or fever. A detailed review of systems was otherwise stable.    BREAST CANCER HISTORY:  From the initial intake note:  Sonia Hill had screening mammography on 11/10/2009 showing very dense tissue with a possible distortion in the left breast.  Digital mammography on June 10th showed a 1.5 cm cluster of microcalcifications in the lower inner portion of the breast.  On physical exam, there was no palpable mass and no palpable axillary adenopathy.  There was some nipple inversion inferolaterally.  Ultrasound showed a 2.6 cm irregular mass-like area with dense posterior acoustic shadowing in the 3 o'clock position, 1 cm from the nipple, but normal appearing left axillary lymph nodes.    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  A second mass noted on ultrasound was also lobular, also low grade.  Both were ER and PR positive at well over 90%.  Both had low proliferation markers at 9%, and both were Her-2 negative by CISH with ratios of 1.6 and 1.24.  In short, both masses were nearly identical.    On June 16th, the patient had bilateral breast MRIs which showed in the left breast at 3 o'clock an irregular area of abnormal enhancement measuring up to 2.7 cm and in the left lower inner quadrant, a second post-biopsy area measuring 1.5 cm.  The  difference between these two areas was stated at the conference the morning of the visit to be approximately 5 cm.  Given this data the patient opted for Left mastectomy with sentinel lymph node sampling, performed July 2011 with  results as detailed below.  Subsequent history is as detailed below.    PAST MEDICAL HISTORY: Past Medical History:  Diagnosis Date  . Arthritis    neck, upper back, shoulders - no meds - yoga  . Chronic kidney disease    Only has right kidney  . Depression    Hx - no current problem  . GERD (gastroesophageal reflux disease)    diet controlled - no meds  . Hyperlipidemia    diet controlled  . Hypothyroidism   . lt breast ca dx'd 11/2009  . Osteoporosis, unspecified 12/06/2013  . PONV (postoperative nausea and vomiting)   . SVD (spontaneous vaginal delivery)    x 1  1. Osteopenia. 2. Congenital left renal atrophy.  3. History of recurrent UTIs. 4. History of bladder reconstruction for reflux more than 18 years ago. 5. History of depression and anxiety. 6. History of hypothyroidism.  7. History of "sling procedure".  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    FAMILY HISTORY The patient's father is alive in his early 27s.  He has Alzheimer's disease. The patient's mother was diagnosed with breast cancer at the age of 36.  She died at the age of 11.  The patient's father had one out of two sisters with breast cancer.  There are other breast cancers on the mother's side.  The patient has one brother and one younger sister, neither with cancer.    GYNECOLOGIC HISTORY:  (Reviewed 12/06/2013) She is GX P1.  That pregnancy was at age 49.  Menarche was at age 3.  She was perimenopausal at the time of diagnosis but is now convincingly postmenopausal  SOCIAL HISTORY:  (Reviewed 12/06/2013) She works as a Electrical engineer, part-time and on consultation.  Her husband, Sonia Hill, is a Architect at [employer].  Sister Sonia Hill works as a Pharmacist, hospital in [place].  The patient's son [name] is  in college.  The patient attends [employer].   ADVANCED DIRECTIVES: in place  HEALTH MAINTENANCE: (Updated 12/06/2013)   Social History   Tobacco Use  . Smoking status: Never Smoker  . Smokeless tobacco: Never Used  Substance Use Topics  . Alcohol use: No  . Drug use: No     Colonoscopy: 06/18/2015; [facility]  PAP: UTD/May 2015, Dr. Matthew Saras  Bone density: Sonia Hill 2014, osteoporosis  Lipid panel: UTD (Not on file)    Allergies  Allergen Reactions  . Adhesive [Tape] Rash    Ok to use paper tape and tegaderm over IV site  . Betadine [Povidone Iodine] Rash  . Mercury Rash    Reaction mercurachrome  . Sulfa Antibiotics Rash    Current Outpatient Medications  Medication Sig Dispense Refill  . ALPRAZolam (XANAX) 0.25 MG tablet TAKE 1 TABLET BY MOUTH EVERY 8 HOURS AS NEEDED FOR ANXIETY OR SLEEP 15 tablet 0  . cholecalciferol (D-VI-SOL) 400 UNIT/ML LIQD Take 400 Units by mouth daily.    Marland Kitchen exemestane (AROMASIN) 25 MG tablet Take 1 tablet (25 mg total) by mouth daily after breakfast. (Patient not taking: Reported on 09/13/2017) 90 tablet 4  . gabapentin (NEURONTIN) 300 MG capsule Take 1 capsule (300 mg total) by mouth 3 (three) times daily as needed. (  Patient taking differently: Take 300 mg by mouth 3 (three) times daily as needed (nerve pain). ) 90 capsule 1  . ibuprofen (ADVIL,MOTRIN) 200 MG tablet Take 200 mg by mouth every 6 (six) hours as needed for moderate pain.    Marland Kitchen ondansetron (ZOFRAN-ODT) 8 MG disintegrating tablet Take 1 tablet (8 mg total) by mouth every 8 (eight) hours as needed for nausea or vomiting. 40 tablet 1  . polyethylene glycol (MIRALAX / GLYCOLAX) packet Take 17 g by mouth daily.    . promethazine (PHENERGAN) 25 MG suppository Place 1 suppository (25 mg total) rectally every 6 (six) hours as needed for nausea or vomiting. (Patient not taking: Reported on 09/13/2017) 12 each 0  . simethicone (MYLICON) 812 MG chewable tablet Chew 125 mg by mouth every 6 (six) hours as needed for flatulence.    . Thyroid (NATURE-THROID) 81.25 MG TABS Take 81.25 mg by mouth daily.     No current  facility-administered medications for this visit.     OBJECTIVE: Middle aged white woman who appears older than stated age  10:   09/25/17 1620  BP: (!) 159/83  Pulse: 85  Resp: 18  Temp: 98.6 F (37 C)  SpO2: 100%     Body mass index is 18.46 kg/m.    ECOG FS: 2 Filed Weights   09/25/17 1620  Weight: 97 lb 11.2 oz (44.3 kg)    Sclerae unicteric, EOMs intact No cervical or supraclavicular adenopathy Lungs no rales or rhonchi Heart regular rate and rhythm Abd soft, nontender, positive bowel sounds, colostomy in place and this is active). MSK no focal spinal tenderness Neuro: nonfocal, well oriented, positive affect Breasts: Deferred   LAB RESULTS:   No results found for: CA125 Lab Results  Component Value Date   LABCA2 181 (H) 10/23/2015  No results found for: CEA    Lab Results  Component Value Date   WBC 6.7 09/13/2017   NEUTROABS 5.4 09/13/2017   HGB 11.5 (L) 08/22/2017   HCT 38.2 09/13/2017   MCV 86.9 09/13/2017   PLT 372 09/13/2017      Chemistry      Component Value Date/Time   NA 135 (L) 09/13/2017 1157   NA 134 (L) 12/28/2016 1503   K 4.5 09/13/2017 1157   K 4.0 12/28/2016 1503   CL 99 09/13/2017 1157   CL 98 12/03/2012 1419   CO2 27 09/13/2017 1157   CO2 25 12/28/2016 1503   BUN 12 09/13/2017 1157   BUN 12.0 12/28/2016 1503   CREATININE 0.81 09/13/2017 1157   CREATININE 0.8 12/28/2016 1503      Component Value Date/Time   CALCIUM 10.0 09/13/2017 1157   CALCIUM 9.7 12/28/2016 1503   ALKPHOS 82 09/13/2017 1157   ALKPHOS 76 12/28/2016 1503   AST 25 09/13/2017 1157   AST 22 12/28/2016 1503   ALT 12 09/13/2017 1157   ALT 20 12/28/2016 1503   BILITOT 0.6 09/13/2017 1157   BILITOT 0.50 12/28/2016 1503      STUDIES: Last CT of the abdomen and pelvis obtained January 2019 reviewed with the patient  ASSESSMENT: 62 y.o.  BRCA 1-2 negative [facility] woman with lobular breast cancer stage IV at presentation July 2011  (1) status post  left mastectomy and sentinel lymph node sampling in July 2011 for a lower inner quadrant T1 N1 M1, stage IV invasive lobular breast cancer, grade 1, strongly estrogen and progesterone receptor-positive, HER2 negative with MIB-1 of 9% and no HER2 amplification,   (  2) with multiple sclerotic bone lesions at presentation seen only on CT scan (not on bone scan or PET scan), but  with biopsy-proven metastatic disease to bone; and an elevated CA 27.29 at presentation,   (3) Oncotype recurrence score of 4, predicts a good response to antiestrogens.  (4) Systemic treatment has consisted of  a) tamoxifen with evidence of response but poor tolerance  b) letrozole starting August 2012, discontinued October 2014 per patient   (5) single functioning kidney  (6) status post bilateral salpingo-oophorectomy 01/24/2013, with benign pathology  (7) osteoporosis; the patient refuses bisphosphonate therapy; started [facility] February 2014.    (a) bone density was obtained under the care of Dr. Matthew Saras at [facility], 06/25/2012, showing osteoporosis with a T score -2.6. This was repeated 12/20/2013, showing again osteoporosis with T-scores between -2.4 and -2.8.  (b) the patient refuses zolendronate, denosumab, or other pharmacologic intervention  (c) bone density on 12/30/2015 under Dr. Matthew Saras showing osteoporosis with T scores between -2.7 and -3.3  (8) the patient refuses standard Mammography or tomography; undergoing thermography screening of the right breast.  (9) PET scan 06/05/2015 shows rectal thickening and a presacral mass; biopsy of this area 10/09/2015 confirms metastatic lobular breast cancer, again estrogen receptor positive, HER-2 nonamplified.  (a) pelvic MRI 03/11/2016 confirms stability of disease  (b) chest CT and pelvic MRI 09/02/2016 shows no change in the circumferential rectal thickening or evidence of extension beyond the serosa and chest CT findings  (c)  colonoscopy 02/23/2017 showed a very narrow rectal lumen with a few apparently uninvolved centimeters distally   (d) CT of the abdomen and pelvis 05/22/2017 (after diverting colostomy) shows interval increase in the size of the perirectal soft tissue masses.  (10) started fulvestrant and palbociclib 125 mg/ day [21/7] May 2017  (a) palbociclib dose decreased to 100 mg daily [21/7] with second cycle, started 11/25/2015  (b) palbociclib discontinued 03/20/2017 with evidence of disease progression  (c) last fulvestrant dose 03/24/2017, discontinued with evidence of progression  (11) status post [facility] November 2018  (12) started exemestane 07/05/2017,  (a) everolimus 5 mg/d started 07/14/2017--held as of 08/20/2017 secondary to rash  (b) exemestane stopped by patient 09/04/2017 to try "the natural way".  PLAN:  Kynzlie's tumor marker was going down on exemestane and everolimus when she developed her significant everolimus rash.  It took her a long time to get rid of the rash and during that time she was only on exemestane.  Then she stopped the exemestane at her own volition about 3 weeks ago.  We now see that the CA-27-29 is rising again.  She is seeing a naturopath and receiving a variety of treatments which are separately documented.  She believes this will turn the cancer back.  I told Sonia Hill what I have for her is either radiation, or chemo, or the combination of exemestane/everolimus at a lower dose, or exemestane with a -ciclib.  She pretty much categorically refuses all of those except build final possibility and she would consider a different CDK 4,6 inhibitor with exemestane but only if the alternative treatments she is planning fail.  I asked Kanaya how I could participate in her care.  What she would like me to do right now is monitor and address her symptoms.  The worst problem she is having is her repeated bladder issues.  I think this may be due  to tumor infiltration of the bladder since we have had several  negative urinalyses and cultures.  We have one pending.  If that culture is negative she would want a pelvic MRI to evaluate the possibility of tumor infiltration.  If we do not see that she would want a cystoscopy.  She would like me to draw her labs on a monthly basis including the CA-27-29.  She made it clear that what she is interested in is quality of life, not so much quantity.  She is having some rectal pain but does not want to take any narcotics or even Ultram because of concerns regarding constipation.  She will give gabapentin a try.  Accordingly I have set her up for repeat lab work and also to see me in July, but she knows I will be glad to start her on active agents anytime that she is ready to consider them  Shritha Bresee, Virgie Dad, MD  09/25/17 5:17 PM Medical Oncology and Hematology [facility] [place], [place] [address] Tel. [phone]    Fax. [phone]  This document serves as a record of services personally performed by Lurline Del, MD. It was created on his behalf by Sheron Nightingale, a trained medical scribe. The creation of this record is based on the scribe's personal observations and the provider's statements to them.   I have reviewed the above documentation for accuracy and completeness, and I agree with the above.

## 2017-09-20 ENCOUNTER — Other Ambulatory Visit: Payer: Self-pay | Admitting: *Deleted

## 2017-09-20 ENCOUNTER — Inpatient Hospital Stay: Payer: 59

## 2017-09-20 ENCOUNTER — Telehealth: Payer: Self-pay | Admitting: *Deleted

## 2017-09-20 DIAGNOSIS — R350 Frequency of micturition: Secondary | ICD-10-CM

## 2017-09-20 DIAGNOSIS — C50312 Malignant neoplasm of lower-inner quadrant of left female breast: Secondary | ICD-10-CM | POA: Diagnosis not present

## 2017-09-20 DIAGNOSIS — R109 Unspecified abdominal pain: Secondary | ICD-10-CM

## 2017-09-20 LAB — URINALYSIS, COMPLETE (UACMP) WITH MICROSCOPIC
BILIRUBIN URINE: NEGATIVE
Glucose, UA: NEGATIVE mg/dL
HGB URINE DIPSTICK: NEGATIVE
Ketones, ur: NEGATIVE mg/dL
LEUKOCYTES UA: NEGATIVE
NITRITE: NEGATIVE
PH: 7 (ref 5.0–8.0)
Protein, ur: NEGATIVE mg/dL
SPECIFIC GRAVITY, URINE: 1.013 (ref 1.005–1.030)

## 2017-09-20 NOTE — Telephone Encounter (Addendum)
This RN received VM from pt stating she has recurrence of " cystitis " " burning and frequency ". She is requesting a call to schedule an appointment for lab today.  This RN attempted to return call and obtained VM - message left that appointment will be made for 3pm lab today - and if she comes to wait for this RN for results.   437 pm - Maelyn came in for lab and returned home- this RN called her with results of U/A within normal readings. Shayona states " maybe it is irritated from my rectal issues that are kind of worse this week "  Yuval is having rectal oozing that requires her to wear a pad - she now has irritated tissue in this area - " and maybe it is now more in the front and causing skin irritation when I urinate "  This RN discussed above- Olevia has not taken any pain medication and will use for comfort. She will call this RN tomorrow with update.

## 2017-09-21 LAB — URINE CULTURE

## 2017-09-22 ENCOUNTER — Other Ambulatory Visit: Payer: Self-pay | Admitting: Oncology

## 2017-09-22 DIAGNOSIS — C50919 Malignant neoplasm of unspecified site of unspecified female breast: Secondary | ICD-10-CM

## 2017-09-25 ENCOUNTER — Inpatient Hospital Stay (HOSPITAL_BASED_OUTPATIENT_CLINIC_OR_DEPARTMENT_OTHER): Payer: 59 | Admitting: Oncology

## 2017-09-25 ENCOUNTER — Other Ambulatory Visit: Payer: Self-pay | Admitting: *Deleted

## 2017-09-25 ENCOUNTER — Inpatient Hospital Stay: Payer: 59

## 2017-09-25 VITALS — BP 159/83 | HR 85 | Temp 98.6°F | Resp 18 | Ht 61.0 in | Wt 97.7 lb

## 2017-09-25 DIAGNOSIS — Z905 Acquired absence of kidney: Secondary | ICD-10-CM

## 2017-09-25 DIAGNOSIS — R609 Edema, unspecified: Secondary | ICD-10-CM | POA: Diagnosis not present

## 2017-09-25 DIAGNOSIS — E785 Hyperlipidemia, unspecified: Secondary | ICD-10-CM

## 2017-09-25 DIAGNOSIS — Z803 Family history of malignant neoplasm of breast: Secondary | ICD-10-CM

## 2017-09-25 DIAGNOSIS — R350 Frequency of micturition: Secondary | ICD-10-CM

## 2017-09-25 DIAGNOSIS — E039 Hypothyroidism, unspecified: Secondary | ICD-10-CM | POA: Diagnosis not present

## 2017-09-25 DIAGNOSIS — K219 Gastro-esophageal reflux disease without esophagitis: Secondary | ICD-10-CM

## 2017-09-25 DIAGNOSIS — C7951 Secondary malignant neoplasm of bone: Secondary | ICD-10-CM | POA: Diagnosis not present

## 2017-09-25 DIAGNOSIS — Z170 Estrogen receptor positive status [ER+]: Secondary | ICD-10-CM | POA: Diagnosis not present

## 2017-09-25 DIAGNOSIS — R109 Unspecified abdominal pain: Secondary | ICD-10-CM

## 2017-09-25 DIAGNOSIS — M810 Age-related osteoporosis without current pathological fracture: Secondary | ICD-10-CM

## 2017-09-25 DIAGNOSIS — C50919 Malignant neoplasm of unspecified site of unspecified female breast: Secondary | ICD-10-CM

## 2017-09-25 DIAGNOSIS — C50312 Malignant neoplasm of lower-inner quadrant of left female breast: Secondary | ICD-10-CM

## 2017-09-25 DIAGNOSIS — K6289 Other specified diseases of anus and rectum: Secondary | ICD-10-CM

## 2017-09-25 DIAGNOSIS — K56609 Unspecified intestinal obstruction, unspecified as to partial versus complete obstruction: Secondary | ICD-10-CM

## 2017-09-25 LAB — URINALYSIS, COMPLETE (UACMP) WITH MICROSCOPIC
Bacteria, UA: NONE SEEN
Bilirubin Urine: NEGATIVE
GLUCOSE, UA: NEGATIVE mg/dL
HGB URINE DIPSTICK: NEGATIVE
Ketones, ur: NEGATIVE mg/dL
LEUKOCYTES UA: NEGATIVE
NITRITE: NEGATIVE
PH: 6 (ref 5.0–8.0)
Protein, ur: NEGATIVE mg/dL
SPECIFIC GRAVITY, URINE: 1.016 (ref 1.005–1.030)
Squamous Epithelial / LPF: NONE SEEN

## 2017-09-26 NOTE — Progress Notes (Signed)
Noted, Sonia Hill

## 2017-09-27 ENCOUNTER — Encounter: Payer: Self-pay | Admitting: Oncology

## 2017-09-27 LAB — URINE CULTURE

## 2017-10-02 ENCOUNTER — Telehealth: Payer: Self-pay | Admitting: *Deleted

## 2017-10-02 ENCOUNTER — Other Ambulatory Visit: Payer: Self-pay | Admitting: *Deleted

## 2017-10-02 ENCOUNTER — Other Ambulatory Visit: Payer: Self-pay | Admitting: Oncology

## 2017-10-02 DIAGNOSIS — C7951 Secondary malignant neoplasm of bone: Principal | ICD-10-CM

## 2017-10-02 DIAGNOSIS — C50919 Malignant neoplasm of unspecified site of unspecified female breast: Secondary | ICD-10-CM

## 2017-10-02 NOTE — Telephone Encounter (Signed)
This RN received VM from pt stating she is waiting to be scheduled for MRI as well as obtain results of urine culture.  This RN noted MRI not authorized and needs peer to peer for additional clinical - note sent last week.  Per VM Sonia Hill states she is having increased pain in her lower back with abd swelling as well as difficulty urinating.  This RN left a VM for Tia in [facility] due to need to do contact insurance - but due to later date do we need to resubmit?

## 2017-10-02 NOTE — Telephone Encounter (Signed)
This RN contacted insurance for additional clinical and obtained an authorization number of [phone].  Called and scheduled MRI for this Thursday at [place] at [facility] - pt to be there at 730 am.  Called to pt and discussed with Loralie verbalizing understanding of date, time and location.  Reviewed urine culture which continues to show contamination ( due to known rectal leakage issues ).  Maeli states she is having increased discomfort , some noted swelling and difficulty voiding.  Tema states she is concerned about tumor invading into her urinary system " and I have seen Dr Matilde Sprang in the past for urology issues and if we need him I am comfortable with his care."

## 2017-10-04 ENCOUNTER — Other Ambulatory Visit: Payer: Self-pay | Admitting: *Deleted

## 2017-10-04 ENCOUNTER — Encounter (HOSPITAL_COMMUNITY): Payer: 59

## 2017-10-04 ENCOUNTER — Telehealth: Payer: Self-pay | Admitting: *Deleted

## 2017-10-04 DIAGNOSIS — C7951 Secondary malignant neoplasm of bone: Principal | ICD-10-CM

## 2017-10-04 DIAGNOSIS — C50919 Malignant neoplasm of unspecified site of unspecified female breast: Secondary | ICD-10-CM

## 2017-10-04 NOTE — Telephone Encounter (Signed)
This RN returned call to pt per her VM stating onset of " throbbing pain behind my left knee that started yesterday "  Sonia Hill stated on the VM " I have been wearing knee hi compression stockings and one of my friends said I really should be wearing the longer ones "  " I would like to get a doppler to make sure it is not a blood clot "  This RN obtained an appointment time for 2 pm today at [facility]- per contact with pt she states she is unable to come today due to " I am in [place] - could I get it tomorrow after the MRI "  Appointment rescheduled to 10 am 10/05/2017.

## 2017-10-05 ENCOUNTER — Ambulatory Visit (HOSPITAL_COMMUNITY)
Admission: RE | Admit: 2017-10-05 | Discharge: 2017-10-05 | Disposition: A | Discharge status: Home / Self Care | Payer: 59 | Source: Ambulatory Visit | Attending: Oncology | Admitting: Oncology

## 2017-10-05 ENCOUNTER — Other Ambulatory Visit: Payer: Self-pay

## 2017-10-05 ENCOUNTER — Telehealth: Payer: Self-pay

## 2017-10-05 DIAGNOSIS — C50919 Malignant neoplasm of unspecified site of unspecified female breast: Secondary | ICD-10-CM | POA: Diagnosis present

## 2017-10-05 DIAGNOSIS — M79609 Pain in unspecified limb: Secondary | ICD-10-CM

## 2017-10-05 DIAGNOSIS — C50312 Malignant neoplasm of lower-inner quadrant of left female breast: Secondary | ICD-10-CM

## 2017-10-05 DIAGNOSIS — Z905 Acquired absence of kidney: Secondary | ICD-10-CM | POA: Diagnosis not present

## 2017-10-05 DIAGNOSIS — C7951 Secondary malignant neoplasm of bone: Secondary | ICD-10-CM | POA: Insufficient documentation

## 2017-10-05 DIAGNOSIS — D375 Neoplasm of uncertain behavior of rectum: Secondary | ICD-10-CM | POA: Diagnosis not present

## 2017-10-05 MED ORDER — GADOBENATE DIMEGLUMINE 529 MG/ML IV SOLN
10.0000 mL | Freq: Once | INTRAVENOUS | Status: AC | PRN
  Administered 2017-10-05: 8 mL via INTRAVENOUS

## 2017-10-05 NOTE — Progress Notes (Signed)
Left lower extremity venous duplex has been completed. Negative for DVT. Results were given to Dr. [name].   10/05/17 11:22 AM Sonia Hill RVT

## 2017-10-05 NOTE — Telephone Encounter (Signed)
Spoke with Sonia Hill at [facility] and she has received the referral for pt.  Called pt, no answer, left her a VM and let her know to expect a call from them today regarding referral for lymphedema. Left contact info for there and our office if needed.

## 2017-10-05 NOTE — Progress Notes (Signed)
Received VM from pt regarding lymphedema and would like to have prescription for compression hose but is worried about them pressing on her colostomy.  Called pt back and she reports she was seen at [facility] at the [facility] for lymphedema for a massage and they mentioned her needing compression hose that go up further than knee high.  Pt said she was not aware of any where locally that she could be seen for lymphedema and reports she just had doppler studies today which were neg for clot and that she is having her pelvic MRI today.  Notified Dr [name] and he would like pt to have referral to the [facility] for lymphedema.  I put in the referral.  They are at lunch so I will call them after 1 pm and make sure they got her referral and then call pt back.

## 2017-10-06 ENCOUNTER — Other Ambulatory Visit: Payer: Self-pay | Admitting: Oncology

## 2017-10-06 NOTE — Progress Notes (Signed)
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  She is having some mucousy discharge from the uterus and that explains that portion.  It also suggests involvement of the bladder and that does explain the symptoms she has been experiencing.  She wants to continue her alternative treatment program at least for now.  She might consider adding Ibrance or a similar medication at some point.  I suggested that she might want to discuss her situation with 1 of our radiation oncologists but at this point she refuses.  She is having lab work on 11/07/2017.  I am making her an appointment to see me on 11/10/2017 to discuss her situation again in detail.

## 2017-10-09 ENCOUNTER — Inpatient Hospital Stay: Payer: 59

## 2017-10-09 ENCOUNTER — Telehealth: Payer: Self-pay | Admitting: Oncology

## 2017-10-09 DIAGNOSIS — M810 Age-related osteoporosis without current pathological fracture: Secondary | ICD-10-CM

## 2017-10-09 DIAGNOSIS — Z170 Estrogen receptor positive status [ER+]: Secondary | ICD-10-CM

## 2017-10-09 DIAGNOSIS — M818 Other osteoporosis without current pathological fracture: Secondary | ICD-10-CM

## 2017-10-09 DIAGNOSIS — E440 Moderate protein-calorie malnutrition: Secondary | ICD-10-CM

## 2017-10-09 DIAGNOSIS — K56609 Unspecified intestinal obstruction, unspecified as to partial versus complete obstruction: Secondary | ICD-10-CM

## 2017-10-09 DIAGNOSIS — C50912 Malignant neoplasm of unspecified site of left female breast: Secondary | ICD-10-CM

## 2017-10-09 DIAGNOSIS — C7951 Secondary malignant neoplasm of bone: Principal | ICD-10-CM

## 2017-10-09 DIAGNOSIS — C50312 Malignant neoplasm of lower-inner quadrant of left female breast: Secondary | ICD-10-CM | POA: Diagnosis not present

## 2017-10-09 LAB — COMPREHENSIVE METABOLIC PANEL
ALBUMIN: 3.6 g/dL (ref 3.5–5.0)
ALT: 9 U/L (ref 0–55)
AST: 23 U/L (ref 5–34)
Alkaline Phosphatase: 85 U/L (ref 40–150)
Anion gap: 7 (ref 3–11)
BUN: 12 mg/dL (ref 7–26)
CHLORIDE: 100 mmol/L (ref 98–109)
CO2: 25 mmol/L (ref 22–29)
CREATININE: 0.83 mg/dL (ref 0.60–1.10)
Calcium: 9.8 mg/dL (ref 8.4–10.4)
GFR calc Af Amer: >60 mL/min (ref >60)
GFR calc non Af Amer: >60 mL/min (ref >60)
GLUCOSE: 89 mg/dL (ref 70–140)
Potassium: 4.6 mmol/L (ref 3.5–5.1)
SODIUM: 132 mmol/L — AB (ref 136–145)
Total Bilirubin: 0.3 mg/dL (ref 0.2–1.2)
Total Protein: 6.6 g/dL (ref 6.4–8.3)

## 2017-10-09 LAB — CBC WITH DIFFERENTIAL/PLATELET
Basophils Absolute: 0 10*3/uL (ref 0.0–0.1)
Basophils Relative: 1 %
EOS PCT: 2 %
Eosinophils Absolute: 0.1 10*3/uL (ref 0.0–0.5)
HCT: 36.6 % (ref 34.8–46.6)
Hemoglobin: 12.2 g/dL (ref 11.6–15.9)
LYMPHS ABS: 0.9 10*3/uL (ref 0.9–3.3)
LYMPHS PCT: 19 %
MCH: 28.8 pg (ref 25.1–34.0)
MCHC: 33.3 g/dL (ref 31.5–36.0)
MCV: 86.5 fL (ref 79.5–101.0)
MONOS PCT: 7 %
Monocytes Absolute: 0.3 10*3/uL (ref 0.1–0.9)
Neutro Abs: 3.4 10*3/uL (ref 1.5–6.5)
Neutrophils Relative %: 71 %
Platelets: 387 10*3/uL (ref 145–400)
RBC: 4.23 MIL/uL (ref 3.70–5.45)
RDW: 15.2 % — ABNORMAL HIGH (ref 11.2–14.5)
WBC: 4.7 10*3/uL (ref 3.9–10.3)

## 2017-10-09 NOTE — Telephone Encounter (Signed)
Patient called about being late but was on her way

## 2017-10-10 ENCOUNTER — Telehealth: Payer: Self-pay

## 2017-10-10 LAB — CANCER ANTIGEN 27.29: CA 27.29: 540.1 U/mL — ABNORMAL HIGH (ref 0.0–38.6)

## 2017-10-10 NOTE — Telephone Encounter (Signed)
Received a VM from pt regarding she thought she might be able to get an appt today- said she had left today open (Tues) because she spoke with someone about possibly an appt early in week with Dr [name].  I spoke with Dr [name] and he said pt can see Mendel Ryder NP or Lucianne Lei PA if needed while he is out of office.  Called pt back, no answer, no option to leave a VM.

## 2017-10-11 ENCOUNTER — Ambulatory Visit: Payer: 59 | Attending: Oncology | Admitting: Physical Therapy

## 2017-10-11 ENCOUNTER — Other Ambulatory Visit: Payer: Self-pay

## 2017-10-11 ENCOUNTER — Encounter: Payer: Self-pay | Admitting: Physical Therapy

## 2017-10-11 DIAGNOSIS — I890 Lymphedema, not elsewhere classified: Secondary | ICD-10-CM | POA: Diagnosis present

## 2017-10-11 DIAGNOSIS — M6281 Muscle weakness (generalized): Secondary | ICD-10-CM | POA: Insufficient documentation

## 2017-10-11 NOTE — Therapy (Signed)
[place], [place], [address] Phone: [phone]   Fax:  [phone]  Physical Therapy Evaluation  Patient Details  Name: Sonia Hill MRN: [id number] Date of Birth: May 28, 1956 Referring Provider: Magrinat   Encounter Date: 10/11/2017  PT End of Session - 10/11/17 1159    Visit Number  1    Number of Visits  13    Date for PT Re-Evaluation  11/08/17    PT Start Time  0817    PT Stop Time  0848    PT Time Calculation (min)  31 min    Activity Tolerance  Patient tolerated treatment well    Behavior During Therapy  [facility] for tasks assessed/performed       Past Medical History:  Diagnosis Date  . Arthritis    neck, upper back, shoulders - no meds - yoga  . Chronic kidney disease    Only has right kidney  . Depression    Hx - no current problem  . GERD (gastroesophageal reflux disease)    diet controlled - no meds  . Hyperlipidemia    diet controlled  . Hypothyroidism   . lt breast ca dx'd 11/2009  . Osteoporosis, unspecified 12/06/2013  . PONV (postoperative nausea and vomiting)   . SVD (spontaneous vaginal delivery)    x 1    Past Surgical History:  Procedure Laterality Date  . BLADDER NECK RECONSTRUCTION     made a new urethea tube  . bladder tact  2008  . BREAST SURGERY     masectomy left breast  . coloscopy    . COLOSTOMY    . DILATION AND CURETTAGE OF UTERUS    . LAPAROSCOPY N/A 01/24/2013   Procedure: LAPROSCOPY OPERATIVE;  Surgeon: Margarette Asal, MD;  Location: [facility];  Service: Gynecology;  Laterality: N/A;  . left kidney removed     left kidney dysfunction  . SALPINGOOPHORECTOMY Bilateral 01/24/2013   Procedure: SALPINGO OOPHORECTOMY;  Surgeon: Margarette Asal, MD;  Location: [facility];  Service: Gynecology;  Laterality: Bilateral;  . WISDOM TOOTH EXTRACTION      There were no vitals filed for this visit.   Subjective Assessment - 10/11/17 0818    Subjective  Pt reports  she has metastatic disease and is having swelling in her abdomen and left foot. She reports she gets swelling everywhere but is worse on L. She has mets to her bladder and uterus. She has a colostomy on the left side. My abdomen has been swelling for the last 2 weeks but the edema has been there for a month. I already have genital swelling. I had compression hose to the knee but now my thigh is more swollen. I have a fissure between my rectum and uterus.     Pertinent History  BRCA 1-2 negative with lobular breast cancer stage IV at presentation July 2011 status post left mastectomy and sentinel lymph node sampling in July 2011 for a lower inner quadrant T1 N1 M1, stage IV invasive lobular breast cancer, grade 1, strongly estrogen and progesterone receptor-positive, HER2 negative with MIB-1 of 9% and no HER2 amplification, with multiple sclerotic bone lesions at presentation seen only on CT scan (not on bone scan or PET scan), but  with biopsy-proven metastatic disease to bone; and an elevated CA 27.29 at presentation, Oncotype recurrence score of 4, predicts a good response to antiestrogens.Systemic treatment has consisted of tamoxifen with evidence of response but poor tolerance, letrozole starting August  2012, discontinued October 2014 per patient, single functioning kidney, status post bilateral salpingo-oophorectomy 01/24/2013, with benign pathology osteoporosis; the patient refuses bisphosphonate therapy; started [facility] February 2014, bone density was obtained under the care of Dr. Matthew Saras at [facility], 06/25/2012, showing osteoporosis with a T score -2.6. This was repeated 12/20/2013, showing again osteoporosis with T-scores between -2.4 and -2.8, the patient refuses zolendronate, denosumab, or other pharmacologic intervention, bone density on 12/30/2015 under Dr. Matthew Saras showing osteoporosis with T scores between -2.7 and -3., the patient refuses standard Mammography or  tomography; undergoing thermography screening of the right breast., PET scan 06/05/2015 shows rectal thickening and a presacral mass; biopsy of this area 10/09/2015 confirms metastatic lobular breast cancer, again estrogen receptor positive, HER-2 nonamplified, pelvic MRI 03/11/2016 confirms stability of disease, chest CT and pelvic MRI 09/02/2016 shows no change in the circumferential rectal thickening or evidence of extension beyond the serosa and chest CT findings, colonoscopy 02/23/2017 showed a very narrow rectal lumen with a few apparently uninvolved centimeters distally ,CT of the abdomen and pelvis 05/22/2017 (after diverting colostomy) shows interval increase in the size of the perirectal soft tissue masses, started fulvestrant and palbociclib 125 mg/ day 21/18 Oct 2015, palbociclib dose decreased to 100 mg daily 21/7 with second cycle, started 11/25/2015, palbociclib discontinued 03/20/2017 with evidence of disease progression, last fulvestrant dose 03/24/2017, discontinued with evidence of progression, s/p colostomy    Patient Stated Goals  to get some of the swelling controllable    Currently in Pain?  Yes    Pain Score  4     Pain Location  -- rectum    Pain Descriptors / Indicators  Sharp;Stabbing    Pain Type  Chronic pain    Pain Radiating Towards  n/a    Pain Onset  More than a month ago    Pain Frequency  Constant    Aggravating Factors   sitting on hard benches and chair    Pain Relieving Factors  sitting on donut pillow    Effect of Pain on Daily Activities  causes a lot of discomfort         [facility] PT Assessment - 10/11/17 0001      Assessment   Medical Diagnosis  metatstatic left breast cancer    Referring Provider  Magrinat    Onset Date/Surgical Date  11/24/09    Hand Dominance  Right    Prior Therapy  none      Precautions   Precautions  Other (comment)    Precaution Comments  lymphedema, 1 kidney, osteoporosis, colostomy      Restrictions   Weight Bearing  Restrictions  No      Balance Screen   Has the patient fallen in the past 6 months  No    Has the patient had a decrease in activity level because of a fear of falling?   No    Is the patient reluctant to leave their home because of a fear of falling?   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    Leisure  pt reports she exercises a little bit - trampoline to help with lymphatics, wall pushups- 5 min a day divided into 1 min intervals of trampoline pt was very active prior to diagnosis, was walking, yoga      Cognition   Overall Cognitive Status  Within Functional Limits for tasks assessed      ROM / Strength   AROM / PROM / Strength  Strength      Strength   Overall Strength  Deficits bilateral hips 3+/5, knee ext 4+/5        LYMPHEDEMA/ONCOLOGY QUESTIONNAIRE - 10/11/17 0838      Type   Cancer Type  metastatic left breast cancer      Surgeries   Mastectomy Date  12/15/09    Sentinel Lymph Node Biopsy Date  12/15/09    Number Lymph Nodes Removed  1 was positive      Date Lymphedema/Swelling Started   Date  09/11/17      Treatment   Active Chemotherapy Treatment  No    Past Chemotherapy Treatment  No    Active Radiation Treatment  No    Past Radiation Treatment  No    Current Hormone Treatment  No    Past Hormone Therapy  Yes      What other symptoms do you have   Are you Having Heaviness or Tightness  Yes    Are you having Pain   Yes    Are you having pitting edema  Yes    Is it Hard or Difficult finding clothes that fit  Yes             Objective measurements completed on examination: See above findings.                   PT Long Term Goals - 10/11/17 1159      PT LONG TERM GOAL #1   Title  Pt will receive appropriate compression garments for management of bilateral LE, genital and abdominal swelling to decrease risk of infection    Time  4    Period  Weeks    Status  New    Target Date  11/08/17      PT LONG TERM GOAL #2   Title  Pt will be independent in self MLD for long term management of lymphedema    Time  4    Period  Weeks    Status  New    Target Date  11/08/17      PT LONG TERM GOAL #3   Title  Pt will be independent in a home exercise program for continued strengthening and stretching    Time  4    Period  Weeks    Status  New    Target Date  11/08/17      PT LONG TERM GOAL #4   Title  Pt will report a 25% improvmement in bilateral LE, genital and abdominal swelling to allow improved comfort    Time  4    Period  Weeks    Status  New    Target Date  11/08/17             Plan - 10/11/17 6834  Clinical Impression Statement  Pt diagnosed with stage IV metastatic left breast cancer in 2011 and underwent a left mastectomy with SLNB 12/15/09. She has a very complex medical history. She has metastatic lesions in her bones. Due to blockage of rectum due to mets she underwent a colostomy. She also has mets to uterus and bladder. She reports previous bladder issues and weak pelvic floor muscles and she only has 1 functioning kidney. Pt has been on hormone therapy at times but has not completed chemo or radiation. She is having swelling in bilateral LEs, her abdomen and genital region. She reports the swelling is worse on her left side.  She has tried compression stockings to the knee bliaterally but it caused increased thigh edema. Pt arrived late today so therapist did  not have time to take circumferential measurements but swelling was visible in LLE compared to RLE. Pt would benefit from skilled PT serivces for management of lymphedema, compression garments that will not cause increased swelling in other areas, teaching pt to manage lymphedema independent a home exercise program for LE strengthening since pt has decreased hip strength and has not been as active since diagnosis.    History and Personal Factors relevant to plan of care:  1 kidney functioning, metastatic cancer, swelling in bilateral LEs, abdomen, genitals, and hx of left breast cancer, colostomy, pt does not drive    Clinical Presentation  Unstable    Clinical Presentation due to:  metastatic disease, new onset of swelling in bilateral LEs, genitals and abdomen complicates ability to manage lymphedema    Clinical Decision Making  High    Rehab Potential  Fair    Clinical Impairments Affecting Rehab Potential  1 kidney functioning, metastatic disease, osteoporosis, genital, abdominal and bilateral LE swelling    PT Frequency  3x / week    PT Duration  4 weeks    PT Treatment/Interventions  ADLs/Self Care Home Management;Therapeutic activities;Therapeutic exercise;Patient/family education;Manual lymph drainage;Manual techniques;Compression bandaging;Passive range of motion;Vasopneumatic Device;Taping;Orthotic Fit/Training    PT Next Visit Plan  take circumferential measurements, give LLIS, discuss compression options    Consulted and Agree with Plan of Care  Patient       Patient will benefit from skilled therapeutic intervention in order to improve the following deficits and impairments:  Increased edema, Difficulty walking, Decreased knowledge of precautions, Decreased endurance, Decreased strength, Pain  Visit Diagnosis: Lymphedema, not elsewhere classified - Plan: PT plan of care cert/re-cert  Muscle weakness (generalized) - Plan: PT plan of care cert/re-cert     Problem List Patient  Active Problem List   Diagnosis Date Noted  . Protein-calorie malnutrition, severe 07/11/2017  . SBO (small bowel obstruction) ([place]) 06/22/2017  . Protein-calorie malnutrition ([place]) 06/22/2017  . Breast cancer metastasized to multiple sites ([place]) 10/16/2015  . Genetic testing 06/24/2015  . Osteoporosis 12/06/2013  . Cancer of lower-inner quadrant of left female breast ([place]) 03/22/2013  . Breast cancer metastasized to bone [facility]) 10/03/2012    Allyson Sabal [facility] 10/11/2017, 12:03 PM  [place], [place], [address] Phone: [phone]   Fax:  [phone]  Name: Sonia Hill MRN: [id number] Date of Birth: 01/23/1956  Manus Gunning, PT 10/11/17 12:03 PM

## 2017-10-12 ENCOUNTER — Ambulatory Visit: Payer: 59 | Admitting: Physical Therapy

## 2017-10-12 ENCOUNTER — Telehealth: Payer: Self-pay | Admitting: *Deleted

## 2017-10-12 DIAGNOSIS — I890 Lymphedema, not elsewhere classified: Secondary | ICD-10-CM | POA: Diagnosis not present

## 2017-10-12 NOTE — Telephone Encounter (Signed)
This RN returned call to the patient per her VM stating " my husband and I discussed Dr Magrinat's call to me from last week and have decided to go ahead and try a like drug to [place] and an aromatase inhibitor "  Kallie states her current follow is scheduled for 11/10/2017 " but I would like to go ahead and get started before then "  This RN informed Mahasin MD is out of the office until 5/6 and her call will be returned post his review of above.  Yaslin is in agreement to plan.  No other needs at this time.

## 2017-10-12 NOTE — Therapy (Signed)
[place], [place], [address] Phone: [phone]   Fax:  [phone]  Physical Therapy Treatment  Patient Details  Name: Sonia Hill MRN: [id number] Date of Birth: 08/20/1955 Referring Provider: Magrinat   Encounter Date: 10/12/2017  PT End of Session - 10/12/17 1159    Visit Number  2    Number of Visits  13    Date for PT Re-Evaluation  11/08/17    PT Start Time  1219    PT Stop Time  1058    PT Time Calculation (min)  43 min    Activity Tolerance  Patient tolerated treatment well    Behavior During Therapy  [facility] for tasks assessed/performed       Past Medical History:  Diagnosis Date  . Arthritis    neck, upper back, shoulders - no meds - yoga  . Chronic kidney disease    Only has right kidney  . Depression    Hx - no current problem  . GERD (gastroesophageal reflux disease)    diet controlled - no meds  . Hyperlipidemia    diet controlled  . Hypothyroidism   . lt breast ca dx'd 11/2009  . Osteoporosis, unspecified 12/06/2013  . PONV (postoperative nausea and vomiting)   . SVD (spontaneous vaginal delivery)    x 1    Past Surgical History:  Procedure Laterality Date  . BLADDER NECK RECONSTRUCTION     made a new urethea tube  . bladder tact  2008  . BREAST SURGERY     masectomy left breast  . coloscopy    . COLOSTOMY    . DILATION AND CURETTAGE OF UTERUS    . LAPAROSCOPY N/A 01/24/2013   Procedure: LAPROSCOPY OPERATIVE;  Surgeon: Margarette Asal, MD;  Location: [facility];  Service: Gynecology;  Laterality: N/A;  . left kidney removed     left kidney dysfunction  . SALPINGOOPHORECTOMY Bilateral 01/24/2013   Procedure: SALPINGO OOPHORECTOMY;  Surgeon: Margarette Asal, MD;  Location: [facility];  Service: Gynecology;  Laterality: Bilateral;  . WISDOM TOOTH EXTRACTION      There were no vitals filed for this visit.  Subjective Assessment - 10/12/17 1019    Subjective  I only have 1  kidney and we hope the cancer is not in there. I had reflux to my good kidney so I had surgery to re implant the ureter. The heaviness in my legs is like carrying around 2 cinder blocks when I am walking. It is affecting my mobility.     Pertinent History  BRCA 1-2 negative with lobular breast cancer stage IV at presentation July 2011 status post left mastectomy and sentinel lymph node sampling in July 2011 for a lower inner quadrant T1 N1 M1, stage IV invasive lobular breast cancer, grade 1, strongly estrogen and progesterone receptor-positive, HER2 negative with MIB-1 of 9% and no HER2 amplification, with multiple sclerotic bone lesions at presentation seen only on CT scan (not on bone scan or PET scan), but  with biopsy-proven metastatic disease to bone; and an elevated CA 27.29 at presentation, Oncotype recurrence score of 4, predicts a good response to antiestrogens.Systemic treatment has consisted of tamoxifen with evidence of response but poor tolerance, letrozole starting August 2012, discontinued October 2014 per patient, single functioning kidney, status post bilateral salpingo-oophorectomy 01/24/2013, with benign pathology osteoporosis; the patient refuses bisphosphonate therapy; started [facility] February 2014, bone density was obtained under the care of Dr. Matthew Saras at [facility], 06/25/2012, showing osteoporosis with a T score -2.6. This was repeated 12/20/2013, showing again osteoporosis with T-scores between -2.4 and -2.8, the patient refuses zolendronate, denosumab, or other pharmacologic intervention, bone density on 12/30/2015 under Dr. Matthew Saras showing osteoporosis with T scores between -2.7 and -3., the patient refuses standard Mammography or tomography; undergoing thermography screening of the right breast., PET scan 06/05/2015 shows rectal thickening and a presacral mass; biopsy of this area 10/09/2015 confirms metastatic lobular breast cancer, again estrogen receptor  positive, HER-2 nonamplified, pelvic MRI 03/11/2016 confirms stability of disease, chest CT and pelvic MRI 09/02/2016 shows no change in the circumferential rectal thickening or evidence of extension beyond the serosa and chest CT findings, colonoscopy 02/23/2017 showed a very narrow rectal lumen with a few apparently uninvolved centimeters distally ,CT of the abdomen and pelvis 05/22/2017 (after diverting colostomy) shows interval increase in the size of the perirectal soft tissue masses, started fulvestrant and palbociclib 125 mg/ day 21/18 Oct 2015, palbociclib dose decreased to 100 mg daily 21/7 with second cycle, started 11/25/2015, palbociclib discontinued 03/20/2017 with evidence of disease progression, last fulvestrant dose 03/24/2017, discontinued with evidence of progression, s/p colostomy    Patient Stated Goals  to get some of the swelling controllable    Currently in Pain?  Yes    Pain Score  4     Pain Location  -- rectum            LYMPHEDEMA/ONCOLOGY QUESTIONNAIRE - 10/12/17 1032      Lymphedema Assessments   Lymphedema Assessments  Lower extremities      Right Lower Extremity Lymphedema   30 cm Proximal to Suprapatella  46 cm    20 cm Proximal to Suprapatella  44 cm    10 cm Proximal to Suprapatella  39 cm    At Midpatella/Popliteal Crease  34 cm    30 cm Proximal to Floor at Lateral Plantar Foot  33.3 cm    20 cm Proximal to Floor at Lateral Plantar Foot  _0 cm Proximal to Floor at Lateral Malleoli  20.5 cm    5 cm Proximal to 1st MTP Joint  20.2 cm    Across MTP Joint  20.4 cm    Around Proximal Great Toe  6.8 cm      Left Lower Extremity Lymphedema   30 cm Proximal to Suprapatella  45 cm    20 cm Proximal to Suprapatella  45 cm    10 cm Proximal to Suprapatella  40 cm    At Midpatella/Popliteal Crease  37.5 cm    30 cm Proximal to Floor at Lateral Plantar Foot  36.1 cm    20 cm Proximal to Floor at Lateral Plantar Foot  29.3 cm    10 cm Proximal to  Floor at Lateral Malleoli  22 cm    5 cm Proximal to 1st MTP Joint  21.2 cm    Across MTP Joint  21.5 cm    Around Proximal Great Toe  7 cm                [facility] Adult PT Treatment/Exercise - 10/12/17 0001      Manual Therapy   Manual Therapy  Edema management    Edema Management  Discussed course of therapy with pt- educated pt about MLD and re routing fluid from her LEs to her axillary nodes, discussed compression garment options including thigh highs and biker shorts with pad for genital swelling,  circumferential measurements taken - see assessment for more details                  PT Long Term Goals - 10/11/17 1159      PT LONG TERM GOAL #1   Title  Pt will receive appropriate compression garments for management of bilateral LE, genital and abdominal swelling to decrease risk of infection    Time  4    Period  Weeks    Status  New    Target Date  11/08/17      PT LONG TERM GOAL #2   Title  Pt will be independent in self MLD for long term management of lymphedema    Time  4    Period  Weeks    Status  New    Target Date  11/08/17      PT LONG TERM GOAL #3   Title  Pt will be independent in a home exercise program for continued strengthening and stretching    Time  4    Period  Weeks    Status  New    Target Date  11/08/17      PT LONG TERM GOAL #4   Title  Pt will report a 25% improvmement in bilateral LE, genital and abdominal swelling to allow improved comfort    Time  4    Period  Weeks    Status  New    Target Date  11/08/17            Plan - 10/12/17 1159    Clinical Impression Statement  Circumferential measurements taken today. Pt demonstrates increased edema in LLE. Discussed pt's medical history more in depth. She had 1 kidney removed at age 78 so only has one functioning kidney left. She reports that elevation does not decrease her swelling anymore and neither does lying down at night. Reviewed recent CT scans but none showed  malignancy in groin lymph nodes. Unsure if pt is swelling due to fluid overload or impairments with her lymphatic system. Pt reports that her swelling is limiting her mobility and decreasing her quality of life. She is eager to relieve some of the swelling. Will begin MLD at next session moving fluid towards axillary nodes bilaterally. Pt did have a SLNB in 2011 on the L and discussed with pt the possibility of increasing risk of lymphedema in LUE by moving fluid from the LLE to L axillary nodes. Pt is aware of this risk but wants to try it. WIll try MLD once and see how it affects pt and if she is able to pass some of the swelling in her urine. Will ask pt about this since there is malignancy in her bladder. Depending on how pt responds to MLD will help guide what kind of compression garments/bandaging to proceed with. Did educate pt to go ahead and purchase biker shorts with genital pad to help with pelvic swelling.     Rehab Potential  Fair    Clinical Impairments Affecting Rehab Potential  1 kidney functioning, metastatic disease, osteoporosis, genital, abdominal and bilateral LE swelling    PT Frequency  3x / week    PT Duration  4 weeks    PT Treatment/Interventions  ADLs/Self Care Home Management;Therapeutic activities;Therapeutic exercise;Patient/family education;Manual lymph drainage;Manual techniques;Compression bandaging;Passive range of motion;Vasopneumatic Device;Taping;Orthotic Fit/Training    PT Next Visit Plan  begin MLD to bilateral LE with focus on LLE moving fluid towards axillary nodes    Consulted and Agree  with Plan of Care  Patient       Patient will benefit from skilled therapeutic intervention in order to improve the following deficits and impairments:  Increased edema, Difficulty walking, Decreased knowledge of precautions, Decreased endurance, Decreased strength, Pain  Visit Diagnosis: Lymphedema, not elsewhere classified     Problem List Patient Active Problem List    Diagnosis Date Noted  . Protein-calorie malnutrition, severe 07/11/2017  . SBO (small bowel obstruction) ([place]) 06/22/2017  . Protein-calorie malnutrition ([place]) 06/22/2017  . Breast cancer metastasized to multiple sites ([place]) 10/16/2015  . Genetic testing 06/24/2015  . Osteoporosis 12/06/2013  . Cancer of lower-inner quadrant of left female breast ([place]) 03/22/2013  . Breast cancer metastasized to bone [facility]) 10/03/2012    Allyson Sabal [facility] 10/12/2017, 12:05 PM  [place], [place], [address] Phone: [phone]   Fax:  [phone]  Name: SANDRA BRENTS MRN: [id number] Date of Birth: 1955/09/15  Manus Gunning, PT 10/12/17 12:06 PM

## 2017-10-16 ENCOUNTER — Encounter: Payer: Self-pay | Admitting: Oncology

## 2017-10-16 ENCOUNTER — Encounter: Payer: Self-pay | Admitting: Physical Therapy

## 2017-10-16 ENCOUNTER — Ambulatory Visit: Payer: 59 | Admitting: Physical Therapy

## 2017-10-16 ENCOUNTER — Other Ambulatory Visit: Payer: Self-pay | Admitting: Oncology

## 2017-10-16 ENCOUNTER — Encounter

## 2017-10-16 DIAGNOSIS — E43 Unspecified severe protein-calorie malnutrition: Secondary | ICD-10-CM

## 2017-10-16 DIAGNOSIS — C7951 Secondary malignant neoplasm of bone: Principal | ICD-10-CM

## 2017-10-16 DIAGNOSIS — E871 Hypo-osmolality and hyponatremia: Secondary | ICD-10-CM | POA: Insufficient documentation

## 2017-10-16 DIAGNOSIS — I890 Lymphedema, not elsewhere classified: Secondary | ICD-10-CM

## 2017-10-16 DIAGNOSIS — C50312 Malignant neoplasm of lower-inner quadrant of left female breast: Secondary | ICD-10-CM

## 2017-10-16 DIAGNOSIS — C50919 Malignant neoplasm of unspecified site of unspecified female breast: Secondary | ICD-10-CM

## 2017-10-16 DIAGNOSIS — Z170 Estrogen receptor positive status [ER+]: Secondary | ICD-10-CM

## 2017-10-16 NOTE — Therapy (Signed)
[place], [place], [address] Phone: [phone]   Fax:  [phone]  Physical Therapy Treatment  Patient Details  Name: Sonia Hill MRN: [id number] Date of Birth: 11/02/1955 Referring Provider: Magrinat   Encounter Date: 10/16/2017  PT End of Session - 10/16/17 1705    Visit Number  3    Number of Visits  13    Date for PT Re-Evaluation  11/08/17    PT Start Time  9758 pt arrived late    PT Stop Time  1603    PT Time Calculation (min)  40 min    Activity Tolerance  Patient tolerated treatment well    Behavior During Therapy  [facility] for tasks assessed/performed       Past Medical History:  Diagnosis Date  . Arthritis    neck, upper back, shoulders - no meds - yoga  . Chronic kidney disease    Only has right kidney  . Depression    Hx - no current problem  . GERD (gastroesophageal reflux disease)    diet controlled - no meds  . Hyperlipidemia    diet controlled  . Hypothyroidism   . lt breast ca dx'd 11/2009  . Osteoporosis, unspecified 12/06/2013  . PONV (postoperative nausea and vomiting)   . SVD (spontaneous vaginal delivery)    x 1    Past Surgical History:  Procedure Laterality Date  . BLADDER NECK RECONSTRUCTION     made a new urethea tube  . bladder tact  2008  . BREAST SURGERY     masectomy left breast  . coloscopy    . COLOSTOMY    . DILATION AND CURETTAGE OF UTERUS    . LAPAROSCOPY N/A 01/24/2013   Procedure: LAPROSCOPY OPERATIVE;  Surgeon: Margarette Asal, MD;  Location: [facility];  Service: Gynecology;  Laterality: N/A;  . left kidney removed     left kidney dysfunction  . SALPINGOOPHORECTOMY Bilateral 01/24/2013   Procedure: SALPINGO OOPHORECTOMY;  Surgeon: Margarette Asal, MD;  Location: [facility];  Service: Gynecology;  Laterality: Bilateral;  . WISDOM TOOTH EXTRACTION      There were no vitals filed for this visit.  Subjective Assessment - 10/16/17 1525     Subjective  I brought in the different garments I have and the different shoes I have.     Pertinent History  BRCA 1-2 negative with lobular breast cancer stage IV at presentation July 2011 status post left mastectomy and sentinel lymph node sampling in July 2011 for a lower inner quadrant T1 N1 M1, stage IV invasive lobular breast cancer, grade 1, strongly estrogen and progesterone receptor-positive, HER2 negative with MIB-1 of 9% and no HER2 amplification, with multiple sclerotic bone lesions at presentation seen only on CT scan (not on bone scan or PET scan), but  with biopsy-proven metastatic disease to bone; and an elevated CA 27.29 at presentation, Oncotype recurrence score of 4, predicts a good response to antiestrogens.Systemic treatment has consisted of tamoxifen with evidence of response but poor tolerance, letrozole starting August 2012, discontinued October 2014 per patient, single functioning kidney, status post bilateral salpingo-oophorectomy 01/24/2013, with benign pathology osteoporosis; the patient refuses bisphosphonate therapy; started [facility] February 2014, bone density was obtained under the care of Dr. Matthew Saras at [facility], 06/25/2012, showing osteoporosis with a T score -2.6. This was repeated 12/20/2013, showing again osteoporosis with T-scores between -2.4 and -2.8, the patient refuses zolendronate, denosumab, or other pharmacologic intervention, bone  density on 12/30/2015 under Dr. Matthew Saras showing osteoporosis with T scores between -2.7 and -3., the patient refuses standard Mammography or tomography; undergoing thermography screening of the right breast., PET scan 06/05/2015 shows rectal thickening and a presacral mass; biopsy of this area 10/09/2015 confirms metastatic lobular breast cancer, again estrogen receptor positive, HER-2 nonamplified, pelvic MRI 03/11/2016 confirms stability of disease, chest CT and pelvic MRI 09/02/2016 shows no change in the  circumferential rectal thickening or evidence of extension beyond the serosa and chest CT findings, colonoscopy 02/23/2017 showed a very narrow rectal lumen with a few apparently uninvolved centimeters distally ,CT of the abdomen and pelvis 05/22/2017 (after diverting colostomy) shows interval increase in the size of the perirectal soft tissue masses, started fulvestrant and palbociclib 125 mg/ day 21/18 Oct 2015, palbociclib dose decreased to 100 mg daily 21/7 with second cycle, started 11/25/2015, palbociclib discontinued 03/20/2017 with evidence of disease progression, last fulvestrant dose 03/24/2017, discontinued with evidence of progression, s/p colostomy    Patient Stated Goals  to get some of the swelling controllable    Currently in Pain?  Yes    Pain Score  3     Pain Location  -- rectum                       [facility] Adult PT Treatment/Exercise - 10/16/17 0001      Manual Therapy   Manual Therapy  Manual Lymphatic Drainage (MLD);Edema management    Edema Management  pt donned maternity support hose at end of session    Manual Lymphatic Drainage (MLD)  short neck, 5 diaphragmatic breaths, left axillary nodes, establishment of inguino axillary pathway, LLE working proximal to distal then retracing all steps                  PT Long Term Goals - 10/11/17 1159      PT LONG TERM GOAL #1   Title  Pt will receive appropriate compression garments for management of bilateral LE, genital and abdominal swelling to decrease risk of infection    Time  4    Period  Weeks    Status  New    Target Date  11/08/17      PT LONG TERM GOAL #2   Title  Pt will be independent in self MLD for long term management of lymphedema    Time  4    Period  Weeks    Status  New    Target Date  11/08/17      PT LONG TERM GOAL #3   Title  Pt will be independent in a home exercise program for continued strengthening and stretching    Time  4    Period  Weeks    Status  New     Target Date  11/08/17      PT LONG TERM GOAL #4   Title  Pt will report a 25% improvmement in bilateral LE, genital and abdominal swelling to allow improved comfort    Time  4    Period  Weeks    Status  New    Target Date  11/08/17            Plan - 10/16/17 1706    Clinical Impression Statement  Began MLD to LLE today to help decrease swelling and allow improved comfort. Pt ordered maternity compression panty hose and donned those at end of session. The maternity waist allows her to wear compression without adding compression  or increased tightness of her colostomy.     Rehab Potential  Fair    Clinical Impairments Affecting Rehab Potential  1 kidney functioning, metastatic disease, osteoporosis, genital, abdominal and bilateral LE swelling    PT Frequency  3x / week    PT Duration  4 weeks    PT Treatment/Interventions  ADLs/Self Care Home Management;Therapeutic activities;Therapeutic exercise;Patient/family education;Manual lymph drainage;Manual techniques;Compression bandaging;Passive range of motion;Vasopneumatic Device;Taping;Orthotic Fit/Training    PT Next Visit Plan  continue MLD to bilateral LE with focus on LLE moving fluid towards axillary nodes, see if pt did okay after last session, may begin bandaging at some point if pt tolerates MLD well    Consulted and Agree with Plan of Care  Patient       Patient will benefit from skilled therapeutic intervention in order to improve the following deficits and impairments:  Increased edema, Difficulty walking, Decreased knowledge of precautions, Decreased endurance, Decreased strength, Pain  Visit Diagnosis: Lymphedema, not elsewhere classified     Problem List Patient Active Problem List   Diagnosis Date Noted  . Protein-calorie malnutrition, severe 07/11/2017  . SBO (small bowel obstruction) ([place]) 06/22/2017  . Protein-calorie malnutrition ([place]) 06/22/2017  . Breast cancer metastasized to multiple sites ([place])  10/16/2015  . Genetic testing 06/24/2015  . Osteoporosis 12/06/2013  . Cancer of lower-inner quadrant of left female breast ([place]) 03/22/2013  . Breast cancer metastasized to bone [facility]) 10/03/2012    Allyson Sabal [facility] 10/16/2017, 5:08 PM  [place], [place], [address] Phone: [phone]   Fax:  [phone]  Name: Sonia Hill MRN: [id number] Date of Birth: Feb 17, 1956  Manus Gunning, PT 10/16/17 5:09 PM

## 2017-10-16 NOTE — Progress Notes (Signed)
Sonia Hill called stating that she has completed her alternative treatments and she is ready to start the more standard therapy because "the more guns we have going to better".  This seems reasonable.  She already has the exemestane on hand and she will start that today.  I am going to start her on palbociclib at the standard 125 mg dose and we will work through our pharmacy oral chemotherapy specialist to obtain that.  Once its on hand she may start the drug.  She is having significant swelling problems.  Normally I would ask her to stop eating sodium but she is already having a low serum sodium and I think she would benefit from advice from a nephrologist.  Of course the abdominal swelling and leg swelling she is having may be related to spread of her disease and not related to fluid issues.  She is already scheduled to see me on Nov 10, 2017 and she will keep that appointment

## 2017-10-17 ENCOUNTER — Telehealth: Payer: Self-pay | Admitting: Pharmacist

## 2017-10-17 ENCOUNTER — Telehealth: Payer: Self-pay | Admitting: Pharmacy Technician

## 2017-10-17 ENCOUNTER — Other Ambulatory Visit: Payer: Self-pay | Admitting: Oncology

## 2017-10-17 DIAGNOSIS — C7951 Secondary malignant neoplasm of bone: Principal | ICD-10-CM

## 2017-10-17 DIAGNOSIS — C50919 Malignant neoplasm of unspecified site of unspecified female breast: Secondary | ICD-10-CM

## 2017-10-17 MED ORDER — PALBOCICLIB 125 MG PO CAPS: 125.0000 mg | ORAL_CAPSULE | Freq: Every day | ORAL | 4 refills | Status: DC

## 2017-10-17 NOTE — Telephone Encounter (Signed)
Oral Oncology Pharmacist Encounter  Received new prescription for Ibrance (palbociclib) for the treatment of metastatic, hormone-receptor positive breast cancer in conjunction with exemestane, planned duration until disease progression or unacceptable toxicities.  Patient has previously used [name] in combination with fulvestrant (May 2017 - Oct 2018, dose reductions were needed, regimen discontinued due to disease progression).  Labs from 10/09/17 assessed, OK for treatment.  Current medication list in Epic reviewed, no DDIs with Ibance identified:  Prescription has been e-scribed to the [facility] for benefits analysis and approval.  Will discuss with MD previous use of Ibrance.  Oral Oncology Clinic will continue to follow for insurance authorization, copayment issues, initial counseling and start date.  Johny Drilling, PharmD, BCPS, BCOP 10/17/2017 3:07 PM Oral Oncology Clinic [phone]

## 2017-10-17 NOTE — Telephone Encounter (Signed)
Oral Oncology Pharmacist Encounter  Received new prescription for Verzenio (abemaciclib) for the treatment of metastatic, hormone-receptor positive breast cancer in conjunction with exemestane, planned duration until disease progression or unacceptable toxicities.  Labs from 10/09/17 assessed, OK for treatment.  Current medication list in Epic reviewed, no DDIs with Verzenio identified.  Prescription has been e-scribed to [facility] for benefits analysis and approval per insurance requirement.  Oral Oncology Clinic will continue to follow for insurance authorization, copayment issues, initial counseling and start date.  Johny Drilling, PharmD, BCPS, BCOP 10/17/2017 4:34 PM Oral Oncology Clinic [phone]

## 2017-10-17 NOTE — Telephone Encounter (Signed)
Oral Oncology Patient Advocate Encounter  Received notification from [facility] that prior authorization for Sonia Hill is required.  PA submitted on CoverMyMeds Key [id number] Status is pending  Oral Oncology Clinic will continue to follow.  [name]. [name], [place] Patient [place] [phone] 10/17/2017 12:36 PM

## 2017-10-17 NOTE — Telephone Encounter (Signed)
Oral Oncology Patient Advocate Encounter  Prior Authorization for Sonia Hill has been approved.    PA# [id number] Effective dates: 10/17/2017 through 10/18/2018  Oral Oncology Clinic will continue to follow.   Sonia Hill. Sonia Hill, Sonia Hill Patient Sonia Hill [phone] 10/17/2017 2:46 PM

## 2017-10-17 NOTE — Telephone Encounter (Signed)
Oral Oncology Patient Advocate Encounter  Received notification from [facility] that prior authorization for Verzenio is required.  PA submitted on CoverMyMeds Key [id number] Status is pending  Oral Oncology Clinic will continue to follow.  [name]. [name], [place] Patient [place] [phone] 10/17/2017 4:01 PM

## 2017-10-18 ENCOUNTER — Ambulatory Visit: Payer: 59 | Admitting: Physical Therapy

## 2017-10-18 ENCOUNTER — Encounter: Payer: Self-pay | Admitting: Physical Therapy

## 2017-10-18 DIAGNOSIS — I890 Lymphedema, not elsewhere classified: Secondary | ICD-10-CM | POA: Diagnosis not present

## 2017-10-18 MED ORDER — ABEMACICLIB 150 MG PO TABS: 150.0000 mg | ORAL_TABLET | Freq: Two times a day (BID) | ORAL | 4 refills | Status: DC

## 2017-10-18 NOTE — Telephone Encounter (Signed)
Oral Chemotherapy Pharmacist Encounter   I spoke with patient on 10/17/17 for overview of: Verzenio.   Counseled patient on administration, dosing, side effects, monitoring, drug-food interactions, safe handling, storage, and disposal.  Patient will take Verzenio 150mg  tablets, 1 tablet by mouth twice daily without regard to food.  Patient knows to avoid grapefruit and grapefruit juice.  Patient is taking exemestane once daily with breakfast.  Verzenio start date: TBD, pending medication acquisition Likely week of 10/23/17  Side effects include but not limited to: diarrhea, fatigue, nausea, abdominal pain, hair loss, decreased blood counts, and increased liver function tests, and joint pains.  Patient has anti-emetic on hand and knows to take it if nausea develops.   Patient will obtain anti diarrheal and alert the office of 4 or more loose stools above baseline.  We extensively discussed risk of diarrhea with the use of Verzenio. Patient with colostomy bag. Patient states that she generally deals with constipation and has to maintain a regular schedule of MiraLAX to ensure that her colostomy keeps moving. She will continue current MiraLAX regimen until she starts Verzenio so that she knows how her bowels will be affected.  Reviewed with patient importance of keeping a medication schedule and plan for any missed doses.  Ms. Lengyel voiced understanding and appreciation.   All questions answered. Medication reconciliation performed and medication/allergy list updated.  [facility] per insurance requirement. Patient is familiar with the specialty pharmacy processes of her insurance due to previous treatment with Ibrance and Afinitor.  Patient knows to call the office with questions or concerns. Oral Oncology Clinic will continue to follow.  Thank you,  Johny Drilling, PharmD, BCPS, BCOP 10/18/2017    11:35 AM Oral Oncology Clinic [phone]

## 2017-10-18 NOTE — Therapy (Signed)
[place], [place], [address] Phone: [phone]   Fax:  [phone]  Physical Therapy Treatment  Patient Details  Name: Sonia Hill MRN: [id number] Date of Birth: 07-Feb-1956 Referring Provider: Magrinat   Encounter Date: 10/18/2017  PT End of Session - 10/18/17 1214    Visit Number  4    Number of Visits  13    Date for PT Re-Evaluation  11/08/17    PT Start Time  0937    PT Stop Time  1021    PT Time Calculation (min)  44 min    Activity Tolerance  Patient tolerated treatment well    Behavior During Therapy  [facility] for tasks assessed/performed       Past Medical History:  Diagnosis Date  . Arthritis    neck, upper back, shoulders - no meds - yoga  . Chronic kidney disease    Only has right kidney  . Depression    Hx - no current problem  . GERD (gastroesophageal reflux disease)    diet controlled - no meds  . Hyperlipidemia    diet controlled  . Hypothyroidism   . lt breast ca dx'd 11/2009  . Osteoporosis, unspecified 12/06/2013  . PONV (postoperative nausea and vomiting)   . SVD (spontaneous vaginal delivery)    x 1    Past Surgical History:  Procedure Laterality Date  . BLADDER NECK RECONSTRUCTION     made a new urethea tube  . bladder tact  2008  . BREAST SURGERY     masectomy left breast  . coloscopy    . COLOSTOMY    . DILATION AND CURETTAGE OF UTERUS    . LAPAROSCOPY N/A 01/24/2013   Procedure: LAPROSCOPY OPERATIVE;  Surgeon: Margarette Asal, MD;  Location: [facility];  Service: Gynecology;  Laterality: N/A;  . left kidney removed     left kidney dysfunction  . SALPINGOOPHORECTOMY Bilateral 01/24/2013   Procedure: SALPINGO OOPHORECTOMY;  Surgeon: Margarette Asal, MD;  Location: [facility];  Service: Gynecology;  Laterality: Bilateral;  . WISDOM TOOTH EXTRACTION      There were no vitals filed for this visit.  Subjective Assessment - 10/18/17 0940    Subjective  They are  putting me on a new drug for metastatic breast cancer soon. Dr. [name] is going to see me tomorrow at 3 to see if there is any blockage in the kidney.     Pertinent History  BRCA 1-2 negative with lobular breast cancer stage IV at presentation July 2011 status post left mastectomy and sentinel lymph node sampling in July 2011 for a lower inner quadrant T1 N1 M1, stage IV invasive lobular breast cancer, grade 1, strongly estrogen and progesterone receptor-positive, HER2 negative with MIB-1 of 9% and no HER2 amplification, with multiple sclerotic bone lesions at presentation seen only on CT scan (not on bone scan or PET scan), but  with biopsy-proven metastatic disease to bone; and an elevated CA 27.29 at presentation, Oncotype recurrence score of 4, predicts a good response to antiestrogens.Systemic treatment has consisted of tamoxifen with evidence of response but poor tolerance, letrozole starting August 2012, discontinued October 2014 per patient, single functioning kidney, status post bilateral salpingo-oophorectomy 01/24/2013, with benign pathology osteoporosis; the patient refuses bisphosphonate therapy; started [facility] February 2014, bone density was obtained under the care of Dr. Matthew Saras at [facility], 06/25/2012, showing osteoporosis with a T score -2.6. This was repeated 12/20/2013, showing again osteoporosis  with T-scores between -2.4 and -2.8, the patient refuses zolendronate, denosumab, or other pharmacologic intervention, bone density on 12/30/2015 under Dr. Matthew Saras showing osteoporosis with T scores between -2.7 and -3., the patient refuses standard Mammography or tomography; undergoing thermography screening of the right breast., PET scan 06/05/2015 shows rectal thickening and a presacral mass; biopsy of this area 10/09/2015 confirms metastatic lobular breast cancer, again estrogen receptor positive, HER-2 nonamplified, pelvic MRI 03/11/2016 confirms stability of  disease, chest CT and pelvic MRI 09/02/2016 shows no change in the circumferential rectal thickening or evidence of extension beyond the serosa and chest CT findings, colonoscopy 02/23/2017 showed a very narrow rectal lumen with a few apparently uninvolved centimeters distally ,CT of the abdomen and pelvis 05/22/2017 (after diverting colostomy) shows interval increase in the size of the perirectal soft tissue masses, started fulvestrant and palbociclib 125 mg/ day 21/18 Oct 2015, palbociclib dose decreased to 100 mg daily 21/7 with second cycle, started 11/25/2015, palbociclib discontinued 03/20/2017 with evidence of disease progression, last fulvestrant dose 03/24/2017, discontinued with evidence of progression, s/p colostomy    Patient Stated Goals  to get some of the swelling controllable    Currently in Pain?  Yes    Pain Score  2     Pain Location  -- rectum                       [facility] Adult PT Treatment/Exercise - 10/18/17 0001      Manual Therapy   Manual Therapy  Manual Lymphatic Drainage (MLD);Edema management    Edema Management  created a chip pack for pt to wear in her pants to manage genital swelling, pt donned maternity support hose at end of session, then chip pack at genitals then bike shorts to hold up maternity shorts then her pants    Manual Lymphatic Drainage (MLD)  short neck, 5 diaphragmatic breaths, left axillary nodes, establishment of inguino axillary pathway, LLE working proximal to distal then retracing all steps                  PT Long Term Goals - 10/11/17 1159      PT LONG TERM GOAL #1   Title  Pt will receive appropriate compression garments for management of bilateral LE, genital and abdominal swelling to decrease risk of infection    Time  4    Period  Weeks    Status  New    [place] Date  11/08/17      PT LONG TERM GOAL #2   Title  Pt will be independent in self MLD for long term management of lymphedema    Time  4    Period   Weeks    Status  New    Target Date  11/08/17      PT LONG TERM GOAL #3   Title  Pt will be independent in a home exercise program for continued strengthening and stretching    Time  4    Period  Weeks    Status  New    Target Date  11/08/17      PT LONG TERM GOAL #4   Title  Pt will report a 25% improvmement in bilateral LE, genital and abdominal swelling to allow improved comfort    Time  4    Period  Weeks    Status  New    Target Date  11/08/17            Plan -  10/18/17 1215    Clinical Impression Statement  Created a chip pack for pt to wear to control genital swelling. Pt has been wearing maternity leggings for support but they have been sliding down. Educated pt to wear her bike shorts on top of these to help hold them up and to place a chip pack in genital region between panty hose and bike short for swelling. Continued MLD to LLE. Pt will be having testing to determine function of kidney. Will hold off on bandaging until results are back from this. Pt did demonstate a visible reduction in left foot and ankle swelling after MLD today.     Rehab Potential  Fair    Clinical Impairments Affecting Rehab Potential  1 kidney functioning, metastatic disease, osteoporosis, genital, abdominal and bilateral LE swelling    PT Frequency  3x / week    PT Duration  4 weeks    PT Treatment/Interventions  ADLs/Self Care Home Management;Therapeutic activities;Therapeutic exercise;Patient/family education;Manual lymph drainage;Manual techniques;Compression bandaging;Passive range of motion;Vasopneumatic Device;Taping;Orthotic Fit/Training    PT Next Visit Plan  see if results from kidney test are back, see if bike shorts kept maternity stockings up, continue MLD to bilateral LE with focus on LLE moving fluid towards axillary nodes, see if pt did okay after last session, may begin bandaging at some point if pt tolerates MLD well    Consulted and Agree with Plan of Care  Patient        Patient will benefit from skilled therapeutic intervention in order to improve the following deficits and impairments:  Increased edema, Difficulty walking, Decreased knowledge of precautions, Decreased endurance, Decreased strength, Pain  Visit Diagnosis: Lymphedema, not elsewhere classified     Problem List Patient Active Problem List   Diagnosis Date Noted  . Hyponatremia 10/16/2017  . Protein-calorie malnutrition, severe 07/11/2017  . SBO (small bowel obstruction) ([place]) 06/22/2017  . Protein-calorie malnutrition ([place]) 06/22/2017  . Breast cancer metastasized to multiple sites ([place]) 10/16/2015  . Genetic testing 06/24/2015  . Osteoporosis 12/06/2013  . Cancer of lower-inner quadrant of left female breast ([place]) 03/22/2013  . Breast cancer metastasized to bone [facility]) 10/03/2012    Allyson Sabal [facility] 10/18/2017, 12:17 PM  [place], [place], [address] Phone: [phone]   Fax:  [phone]  Name: Sonia Hill MRN: [id number] Date of Birth: 10/31/1955  Manus Gunning, PT 10/18/17 12:18 PM

## 2017-10-19 ENCOUNTER — Ambulatory Visit (HOSPITAL_COMMUNITY)
Admission: RE | Admit: 2017-10-19 | Discharge: 2017-10-19 | Disposition: A | Discharge status: Home / Self Care | Payer: 59 | Source: Ambulatory Visit | Attending: Oncology | Admitting: Oncology

## 2017-10-19 DIAGNOSIS — E871 Hypo-osmolality and hyponatremia: Secondary | ICD-10-CM | POA: Diagnosis not present

## 2017-10-19 DIAGNOSIS — Z170 Estrogen receptor positive status [ER+]: Secondary | ICD-10-CM | POA: Insufficient documentation

## 2017-10-19 DIAGNOSIS — Z905 Acquired absence of kidney: Secondary | ICD-10-CM | POA: Diagnosis not present

## 2017-10-19 DIAGNOSIS — C50312 Malignant neoplasm of lower-inner quadrant of left female breast: Secondary | ICD-10-CM | POA: Insufficient documentation

## 2017-10-19 DIAGNOSIS — N1330 Unspecified hydronephrosis: Secondary | ICD-10-CM | POA: Insufficient documentation

## 2017-10-19 DIAGNOSIS — E43 Unspecified severe protein-calorie malnutrition: Secondary | ICD-10-CM | POA: Insufficient documentation

## 2017-10-19 DIAGNOSIS — C50919 Malignant neoplasm of unspecified site of unspecified female breast: Secondary | ICD-10-CM

## 2017-10-19 DIAGNOSIS — C7951 Secondary malignant neoplasm of bone: Secondary | ICD-10-CM

## 2017-10-20 ENCOUNTER — Encounter: Payer: Self-pay | Admitting: Physical Therapy

## 2017-10-20 ENCOUNTER — Ambulatory Visit: Payer: 59 | Admitting: Physical Therapy

## 2017-10-20 DIAGNOSIS — I890 Lymphedema, not elsewhere classified: Secondary | ICD-10-CM

## 2017-10-20 DIAGNOSIS — M6281 Muscle weakness (generalized): Secondary | ICD-10-CM

## 2017-10-20 NOTE — Therapy (Signed)
[place], [place], [address] Phone: [phone]   Fax:  [phone]  Physical Therapy Treatment  Patient Details  Name: Sonia Hill MRN: [id number] Date of Birth: 1955-08-26 Referring Provider: Magrinat   Encounter Date: 10/20/2017  PT End of Session - 10/20/17 1230    Visit Number  5    Number of Visits  13    Date for PT Re-Evaluation  11/08/17    PT Start Time  1020    PT Stop Time  1100    PT Time Calculation (min)  40 min    Activity Tolerance  Patient tolerated treatment well    Behavior During Therapy  [facility] for tasks assessed/performed       Past Medical History:  Diagnosis Date  . Arthritis    neck, upper back, shoulders - no meds - yoga  . Chronic kidney disease    Only has right kidney  . Depression    Hx - no current problem  . GERD (gastroesophageal reflux disease)    diet controlled - no meds  . Hyperlipidemia    diet controlled  . Hypothyroidism   . lt breast ca dx'd 11/2009  . Osteoporosis, unspecified 12/06/2013  . PONV (postoperative nausea and vomiting)   . SVD (spontaneous vaginal delivery)    x 1    Past Surgical History:  Procedure Laterality Date  . BLADDER NECK RECONSTRUCTION     made a new urethea tube  . bladder tact  2008  . BREAST SURGERY     masectomy left breast  . coloscopy    . COLOSTOMY    . DILATION AND CURETTAGE OF UTERUS    . LAPAROSCOPY N/A 01/24/2013   Procedure: LAPROSCOPY OPERATIVE;  Surgeon: Margarette Asal, MD;  Location: [facility];  Service: Gynecology;  Laterality: N/A;  . left kidney removed     left kidney dysfunction  . SALPINGOOPHORECTOMY Bilateral 01/24/2013   Procedure: SALPINGO OOPHORECTOMY;  Surgeon: Margarette Asal, MD;  Location: [facility];  Service: Gynecology;  Laterality: Bilateral;  . WISDOM TOOTH EXTRACTION      There were no vitals filed for this visit.  Subjective Assessment - 10/20/17 0945    Subjective  Pt states  she has not used her compression yesterday because she coudl not keep the maternity pantyhose up.  She ordered medical grade compression and continues to wear the chip pack at her pubic area She is having swelling mostly in her left leg     Pertinent History  BRCA 1-2 negative with lobular breast cancer stage IV at presentation July 2011 status post left mastectomy and sentinel lymph node sampling in July 2011 for a lower inner quadrant T1 N1 M1, stage IV invasive lobular breast cancer, grade 1, strongly estrogen and progesterone receptor-positive, HER2 negative with MIB-1 of 9% and no HER2 amplification, with multiple sclerotic bone lesions at presentation seen only on CT scan (not on bone scan or PET scan), but  with biopsy-proven metastatic disease to bone; and an elevated CA 27.29 at presentation, Oncotype recurrence score of 4, predicts a good response to antiestrogens.Systemic treatment has consisted of tamoxifen with evidence of response but poor tolerance, letrozole starting August 2012, discontinued October 2014 per patient, single functioning kidney, status post bilateral salpingo-oophorectomy 01/24/2013, with benign pathology osteoporosis; the patient refuses bisphosphonate therapy; started [facility] February 2014, bone density was obtained under the care of Dr. Matthew Saras at [facility], 06/25/2012, showing osteoporosis with  a T score -2.6. This was repeated 12/20/2013, showing again osteoporosis with T-scores between -2.4 and -2.8, the patient refuses zolendronate, denosumab, or other pharmacologic intervention, bone density on 12/30/2015 under Dr. Matthew Saras showing osteoporosis with T scores between -2.7 and -3., the patient refuses standard Mammography or tomography; undergoing thermography screening of the right breast., PET scan 06/05/2015 shows rectal thickening and a presacral mass; biopsy of this area 10/09/2015 confirms metastatic lobular breast cancer, again estrogen  receptor positive, HER-2 nonamplified, pelvic MRI 03/11/2016 confirms stability of disease, chest CT and pelvic MRI 09/02/2016 shows no change in the circumferential rectal thickening or evidence of extension beyond the serosa and chest CT findings, colonoscopy 02/23/2017 showed a very narrow rectal lumen with a few apparently uninvolved centimeters distally ,CT of the abdomen and pelvis 05/22/2017 (after diverting colostomy) shows interval increase in the size of the perirectal soft tissue masses, started fulvestrant and palbociclib 125 mg/ day 21/18 Oct 2015, palbociclib dose decreased to 100 mg daily 21/7 with second cycle, started 11/25/2015, palbociclib discontinued 03/20/2017 with evidence of disease progression, last fulvestrant dose 03/24/2017, discontinued with evidence of progression, s/p colostomy    Patient Stated Goals  to get some of the swelling controllable    Currently in Pain?  Yes    Pain Score  2     Pain Location  -- rectum                        [facility] Adult PT Treatment/Exercise - 10/20/17 0001      Manual Therapy   Manual Lymphatic Drainage (MLD)  short neck, 5 diaphragmatic breaths, left axillary nodes, establishment of inguino axillary pathway, LLE working proximal to distal then retracing all steps biefly, right latera chest to axilla due to fullness in right abdomen                   PT Long Term Goals - 10/11/17 1159      PT LONG TERM GOAL #1   Title  Pt will receive appropriate compression garments for management of bilateral LE, genital and abdominal swelling to decrease risk of infection    Time  4    Period  Weeks    Status  New    Target Date  11/08/17      PT LONG TERM GOAL #2   Title  Pt will be independent in self MLD for long term management of lymphedema    Time  4    Period  Weeks    Status  New    Target Date  11/08/17      PT LONG TERM GOAL #3   Title  Pt will be independent in a home exercise program for continued  strengthening and stretching    Time  4    Period  Weeks    Status  New    Target Date  11/08/17      PT LONG TERM GOAL #4   Title  Pt will report a 25% improvmement in bilateral LE, genital and abdominal swelling to allow improved comfort    Time  4    Period  Weeks    Status  New    Target Date  11/08/17            Plan - 10/20/17 1231    Clinical Impression Statement  Pt is having difficulty keeping her pantyhose up. She has not heard yet about kidney function. Feel she will benefit from compression  bandaging when she is cleared for that     Clinical Impairments Affecting Rehab Potential  1 kidney functioning, metastatic disease, osteoporosis, genital, abdominal and bilateral LE swelling    PT Frequency  3x / week    PT Duration  4 weeks    PT Treatment/Interventions  ADLs/Self Care Home Management;Therapeutic activities;Therapeutic exercise;Patient/family education;Manual lymph drainage;Manual techniques;Compression bandaging;Passive range of motion;Vasopneumatic Device;Taping;Orthotic Fit/Training    PT Next Visit Plan  see if results from kidney test are back, see if bike shorts kept maternity stockings up, continue MLD to bilateral LE with focus on LLE moving fluid towards axillary nodes, see if pt did okay after last session, may begin bandaging at some point if pt tolerates MLD well       Patient will benefit from skilled therapeutic intervention in order to improve the following deficits and impairments:  Increased edema, Difficulty walking, Decreased knowledge of precautions, Decreased endurance, Decreased strength, Pain  Visit Diagnosis: Lymphedema, not elsewhere classified  Muscle weakness (generalized)     Problem List Patient Active Problem List   Diagnosis Date Noted  . Hyponatremia 10/16/2017  . Protein-calorie malnutrition, severe 07/11/2017  . SBO (small bowel obstruction) ([place]) 06/22/2017  . Protein-calorie malnutrition ([place]) 06/22/2017  . Breast  cancer metastasized to multiple sites ([place]) 10/16/2015  . Genetic testing 06/24/2015  . Osteoporosis 12/06/2013  . Cancer of lower-inner quadrant of left female breast ([place]) 03/22/2013  . Breast cancer metastasized to bone ([place]) 10/03/2012   Donato Heinz. Owens Shark PT  Norwood Levo 10/20/2017, 12:40 PM  [place], [place], [address] Phone: [phone]   Fax:  [phone]  Name: Sonia Hill MRN: [id number] Date of Birth: 03/09/1956

## 2017-10-23 ENCOUNTER — Ambulatory Visit: Payer: 59

## 2017-10-23 ENCOUNTER — Other Ambulatory Visit: Payer: Self-pay

## 2017-10-23 ENCOUNTER — Other Ambulatory Visit: Payer: Self-pay | Admitting: Oncology

## 2017-10-23 ENCOUNTER — Telehealth: Payer: Self-pay

## 2017-10-23 DIAGNOSIS — I890 Lymphedema, not elsewhere classified: Secondary | ICD-10-CM

## 2017-10-23 NOTE — Therapy (Signed)
[place], [place], [address] Phone: [phone]   Fax:  [phone]  Physical Therapy Treatment  Patient Details  Name: Sonia Hill MRN: [id number] Date of Birth: [birth year] Referring Provider: Magrinat   Encounter Date: 10/23/2017  PT End of Session - 10/23/17 0856    Visit Number  6    Number of Visits  13    Date for PT Re-Evaluation  11/08/17    PT Start Time  0804    PT Stop Time  0850    PT Time Calculation (min)  46 min    Activity Tolerance  Patient tolerated treatment well    Behavior During Therapy  [facility] for tasks assessed/performed       Past Medical History:  Diagnosis Date  . Arthritis    neck, upper back, shoulders - no meds - yoga  . Chronic kidney disease    Only has right kidney  . Depression    Hx - no current problem  . GERD (gastroesophageal reflux disease)    diet controlled - no meds  . Hyperlipidemia    diet controlled  . Hypothyroidism   . lt breast ca dx'd 11/2009  . Osteoporosis, unspecified 12/06/2013  . PONV (postoperative nausea and vomiting)   . SVD (spontaneous vaginal delivery)    x 1    Past Surgical History:  Procedure Laterality Date  . BLADDER NECK RECONSTRUCTION     made a new urethea tube  . bladder tact  2008  . BREAST SURGERY     masectomy left breast  . coloscopy    . COLOSTOMY    . DILATION AND CURETTAGE OF UTERUS    . LAPAROSCOPY N/A 01/24/2013   Procedure: LAPROSCOPY OPERATIVE;  Surgeon: Margarette Asal, MD;  Location: [facility];  Service: Gynecology;  Laterality: N/A;  . left kidney removed     left kidney dysfunction  . SALPINGOOPHORECTOMY Bilateral 01/24/2013   Procedure: SALPINGO OOPHORECTOMY;  Surgeon: Margarette Asal, MD;  Location: [facility];  Service: Gynecology;  Laterality: Bilateral;  . WISDOM TOOTH EXTRACTION      There were no vitals filed for this visit.  Subjective Assessment - 10/23/17 0815    Subjective  This morning  is the first time I'm seeing my Rt foot involved with swelling. My Lt leg has just been getting progressively worse. I've been wearing compression pantyhose and got some new ones as well that I haven't tried yet.     Pertinent History  BRCA 1-2 negative with lobular breast cancer stage IV at presentation July 2011 status post left mastectomy and sentinel lymph node sampling in July 2011 for a lower inner quadrant T1 N1 M1, stage IV invasive lobular breast cancer, grade 1, strongly estrogen and progesterone receptor-positive, HER2 negative with MIB-1 of 9% and no HER2 amplification, with multiple sclerotic bone lesions at presentation seen only on CT scan (not on bone scan or PET scan), but  with biopsy-proven metastatic disease to bone; and an elevated CA 27.29 at presentation, Oncotype recurrence score of 4, predicts a good response to antiestrogens.Systemic treatment has consisted of tamoxifen with evidence of response but poor tolerance, letrozole starting August 2012, discontinued October 2014 per patient, single functioning kidney, status post bilateral salpingo-oophorectomy 01/24/2013, with benign pathology osteoporosis; the patient refuses bisphosphonate therapy; started [facility] February 2014, bone density was obtained under the care of Dr. Matthew Saras at [facility], 06/25/2012, showing osteoporosis with a T score -2.6.  This was repeated 12/20/2013, showing again osteoporosis with T-scores between -2.4 and -2.8, the patient refuses zolendronate, denosumab, or other pharmacologic intervention, bone density on 12/30/2015 under Dr. Matthew Saras showing osteoporosis with T scores between -2.7 and -3., the patient refuses standard Mammography or tomography; undergoing thermography screening of the right breast., PET scan 06/05/2015 shows rectal thickening and a presacral mass; biopsy of this area 10/09/2015 confirms metastatic lobular breast cancer, again estrogen receptor positive, HER-2  nonamplified, pelvic MRI 03/11/2016 confirms stability of disease, chest CT and pelvic MRI 09/02/2016 shows no change in the circumferential rectal thickening or evidence of extension beyond the serosa and chest CT findings, colonoscopy 02/23/2017 showed a very narrow rectal lumen with a few apparently uninvolved centimeters distally ,CT of the abdomen and pelvis 05/22/2017 (after diverting colostomy) shows interval increase in the size of the perirectal soft tissue masses, started fulvestrant and palbociclib 125 mg/ day 21/18 Oct 2015, palbociclib dose decreased to 100 mg daily 21/7 with second cycle, started 11/25/2015, palbociclib discontinued 03/20/2017 with evidence of disease progression, last fulvestrant dose 03/24/2017, discontinued with evidence of progression, s/p colostomy    Patient Stated Goals  to get some of the swelling controllable                       [facility] Adult PT Treatment/Exercise - 10/23/17 0001      Manual Therapy   Manual therapy comments  Further discussed pts compression garment options. She brought knew pantyhose that she ordered off [employer] (off brand). These are tighter than what she is wearing now so instructed pt to wait until hearing from Dr. [name] (she hopes today) about kidney function test. Holding off on bandaging today as well until pt knows results.    Manual Lymphatic Drainage (MLD)  short neck, 5 diaphragmatic breaths, left axillary nodes, establishment of inguino axillary pathway, LLE working proximal to distal then retracing all steps.                  PT Long Term Goals - 10/11/17 1159      PT LONG TERM GOAL #1   Title  Pt will receive appropriate compression garments for management of bilateral LE, genital and abdominal swelling to decrease risk of infection    Time  4    Period  Weeks    Status  New    Target Date  11/08/17      PT LONG TERM GOAL #2   Title  Pt will be independent in self MLD for long term management of  lymphedema    Time  4    Period  Weeks    Status  New    Target Date  11/08/17      PT LONG TERM GOAL #3   Title  Pt will be independent in a home exercise program for continued strengthening and stretching    Time  4    Period  Weeks    Status  New    Target Date  11/08/17      PT LONG TERM GOAL #4   Title  Pt will report a 25% improvmement in bilateral LE, genital and abdominal swelling to allow improved comfort    Time  4    Period  Weeks    Status  New    Target Date  11/08/17            Plan - 10/23/17 0857    Clinical Impression Statement  Continued with manual  lymph drainage (MLD) to Lt LE today. After further discussing with pt how she tolerated MLD last week decided to hold off on bandaging or wearing new, tighter compression garments until she hears from Dr. [name] about kidney function. Concern is that she does notice swelling in Lt leg reducing after each session last week but no increase in urinary frequency or output so this can be a sign that kidney is backed up and pt agrees with waiting since this is her only kidney. Pt did wear bike shorts over pantyhose yesterday and she reports this did help keep pantyhose up some for church.    Rehab Potential  Fair    Clinical Impairments Affecting Rehab Potential  1 kidney functioning, metastatic disease, osteoporosis, genital, abdominal and bilateral LE swelling    PT Frequency  3x / week    PT Duration  4 weeks    PT Treatment/Interventions  ADLs/Self Care Home Management;Therapeutic activities;Therapeutic exercise;Patient/family education;Manual lymph drainage;Manual techniques;Compression bandaging;Passive range of motion;Vasopneumatic Device;Taping;Orthotic Fit/Training    PT Next Visit Plan  see if results from kidney test are back, continue MLD to bilateral LE with focus on LLE moving fluid towards axillary nodes, see if pt did okay after last session, may begin bandaging at some point if pt tolerates MLD well (would  be best to wait on results of kidney test though before beginning bandaging)    Consulted and Agree with Plan of Care  Patient       Patient will benefit from skilled therapeutic intervention in order to improve the following deficits and impairments:  Increased edema, Difficulty walking, Decreased knowledge of precautions, Decreased endurance, Decreased strength, Pain  Visit Diagnosis: Lymphedema, not elsewhere classified     Problem List Patient Active Problem List   Diagnosis Date Noted  . Hyponatremia 10/16/2017  . Protein-calorie malnutrition, severe 07/11/2017  . SBO (small bowel obstruction) ([place]) 06/22/2017  . Protein-calorie malnutrition ([place]) 06/22/2017  . Breast cancer metastasized to multiple sites ([place]) 10/16/2015  . Genetic testing 06/24/2015  . Osteoporosis 12/06/2013  . Cancer of lower-inner quadrant of left female breast ([place]) 03/22/2013  . Breast cancer metastasized to bone [facility]) 10/03/2012    Otelia Limes, PTA 10/23/2017, 9:23 AM  [place], [place], [address] Phone: [phone]   Fax:  [phone]  Name: Sonia Hill MRN: [id number] Date of Birth: January 26, 1956

## 2017-10-23 NOTE — Telephone Encounter (Signed)
Returned pt's call regarding her recent renal u/s results. Dr [name] spoke with pt and relayed this to her:  waiting to hear from physician regarding recent kidney u/s results compared with her other CT results and looks to hear from him later today or tomorrow. No other needs per pt at this time.

## 2017-10-24 ENCOUNTER — Other Ambulatory Visit: Payer: Self-pay | Admitting: Oncology

## 2017-10-24 DIAGNOSIS — C50919 Malignant neoplasm of unspecified site of unspecified female breast: Secondary | ICD-10-CM

## 2017-10-24 DIAGNOSIS — Z170 Estrogen receptor positive status [ER+]: Secondary | ICD-10-CM

## 2017-10-24 DIAGNOSIS — C7951 Secondary malignant neoplasm of bone: Principal | ICD-10-CM

## 2017-10-24 DIAGNOSIS — C50312 Malignant neoplasm of lower-inner quadrant of left female breast: Secondary | ICD-10-CM

## 2017-10-24 DIAGNOSIS — IMO0002 Reserved for concepts with insufficient information to code with codable children: Secondary | ICD-10-CM

## 2017-10-24 DIAGNOSIS — N1339 Other hydronephrosis: Secondary | ICD-10-CM

## 2017-10-24 DIAGNOSIS — C801 Malignant (primary) neoplasm, unspecified: Secondary | ICD-10-CM

## 2017-10-24 DIAGNOSIS — N138 Other obstructive and reflux uropathy: Secondary | ICD-10-CM | POA: Insufficient documentation

## 2017-10-24 DIAGNOSIS — Q600 Renal agenesis, unilateral: Secondary | ICD-10-CM

## 2017-10-24 DIAGNOSIS — C786 Secondary malignant neoplasm of retroperitoneum and peritoneum: Secondary | ICD-10-CM

## 2017-10-24 NOTE — Progress Notes (Signed)
I discussed Sonia Hill's ultrasound results with Dr. Carman Ching.  He feels it looks stable but it is hard to compare different modalities.  He suggested we get a nuclear medicine renal flow study.  I called Bethena Roys and left her message and placed the order for her.

## 2017-10-25 ENCOUNTER — Ambulatory Visit: Payer: 59 | Admitting: Physical Therapy

## 2017-10-25 ENCOUNTER — Other Ambulatory Visit: Payer: Self-pay | Admitting: *Deleted

## 2017-10-25 ENCOUNTER — Encounter: Payer: Self-pay | Admitting: Physical Therapy

## 2017-10-25 DIAGNOSIS — I890 Lymphedema, not elsewhere classified: Secondary | ICD-10-CM

## 2017-10-25 NOTE — Patient Instructions (Signed)
Deep Effective Breath   Standing, sitting, or laying down place both hands on the belly. Take a deep breath IN, expanding the belly; then breath OUT, contracting the belly. Repeat __5__ times. Do __2-3__ sessions per day and before each self massage.  http://gt2.exer.us/866   Copyright  VHI. All rights reserved.  Inguinal Nodes to Axilla - Clear   On involved side, at armpit, make _5__ in-place circles. Then from hip proceed in sections to armpit with stationary circles or pumps _5_ times, this is your pathway. Do _1__ time per day.  Copyright  VHI. All rights reserved.  LEG: Knee to Hip - Clear   Pump up outer thigh of involved leg from knee to outer hip. Then do stationary circles from inner to outer thigh, then do outer thigh again. Next, interlace fingers behind knee IF ABLE and make in-place circles. Do _5_ times of each sequence.  Do _1__ time per day.  Copyright  VHI. All rights reserved.  LEG: Ankle to Hip Sweep   Hands on sides of ankle of involved leg, pump _5__ times up both sides of lower leg, then retrace steps up outer thigh to hip as before and back to pathway. Do _2-3_ times. Do __1_ time per day.  Copyright  VHI. All rights reserved.  FOOT: Dorsum of Foot and Toes Massage   One hand on top of foot make _5_ stationary circles or pumps, then either on top of toes or each individual toe do _5_ pumps. Then retrace all steps pumping back up both sides of lower leg, outer thigh, and then pathway. Finish with what you started with, _5_ circles at involved side arm pit. All _2-3_ times at each sequence. Do _1__ time per day.  Copyright  VHI. All rights reserved.    

## 2017-10-25 NOTE — Therapy (Signed)
[place], [place], [address] Phone: [phone]   Fax:  [phone]  Physical Therapy Treatment  Patient Details  Name: Sonia Hill MRN: [id number] Date of Birth: [birth year] Referring Provider: Magrinat   Encounter Date: 10/25/2017  PT End of Session - 10/25/17 1025    Visit Number  7    Number of Visits  13    Date for PT Re-Evaluation  11/08/17    PT Start Time  0938    PT Stop Time  1026    PT Time Calculation (min)  48 min    Activity Tolerance  Patient tolerated treatment well    Behavior During Therapy  [facility] for tasks assessed/performed       Past Medical History:  Diagnosis Date  . Arthritis    neck, upper back, shoulders - no meds - yoga  . Chronic kidney disease    Only has right kidney  . Depression    Hx - no current problem  . GERD (gastroesophageal reflux disease)    diet controlled - no meds  . Hyperlipidemia    diet controlled  . Hypothyroidism   . lt breast ca dx'd 11/2009  . Osteoporosis, unspecified 12/06/2013  . PONV (postoperative nausea and vomiting)   . SVD (spontaneous vaginal delivery)    x 1    Past Surgical History:  Procedure Laterality Date  . BLADDER NECK RECONSTRUCTION     made a new urethea tube  . bladder tact  2008  . BREAST SURGERY     masectomy left breast  . coloscopy    . COLOSTOMY    . DILATION AND CURETTAGE OF UTERUS    . LAPAROSCOPY N/A 01/24/2013   Procedure: LAPROSCOPY OPERATIVE;  Surgeon: Margarette Asal, MD;  Location: [facility];  Service: Gynecology;  Laterality: N/A;  . left kidney removed     left kidney dysfunction  . SALPINGOOPHORECTOMY Bilateral 01/24/2013   Procedure: SALPINGO OOPHORECTOMY;  Surgeon: Margarette Asal, MD;  Location: [facility];  Service: Gynecology;  Laterality: Bilateral;  . WISDOM TOOTH EXTRACTION      There were no vitals filed for this visit.  Subjective Assessment - 10/25/17 0940    Subjective  The test of  my kidney showed a slight blockage but it doesn't look like it has changed from the past and it is not impacting my creatine. I am being referred to a nephrologist so I am waiting to get clearance on that.     Pertinent History  BRCA 1-2 negative with lobular breast cancer stage IV at presentation July 2011 status post left mastectomy and sentinel lymph node sampling in July 2011 for a lower inner quadrant T1 N1 M1, stage IV invasive lobular breast cancer, grade 1, strongly estrogen and progesterone receptor-positive, HER2 negative with MIB-1 of 9% and no HER2 amplification, with multiple sclerotic bone lesions at presentation seen only on CT scan (not on bone scan or PET scan), but  with biopsy-proven metastatic disease to bone; and an elevated CA 27.29 at presentation, Oncotype recurrence score of 4, predicts a good response to antiestrogens.Systemic treatment has consisted of tamoxifen with evidence of response but poor tolerance, letrozole starting August 2012, discontinued October 2014 per patient, single functioning kidney, status post bilateral salpingo-oophorectomy 01/24/2013, with benign pathology osteoporosis; the patient refuses bisphosphonate therapy; started [facility] February 2014, bone density was obtained under the care of Dr. Matthew Saras at [facility], 06/25/2012, showing osteoporosis with a  T score -2.6. This was repeated 12/20/2013, showing again osteoporosis with T-scores between -2.4 and -2.8, the patient refuses zolendronate, denosumab, or other pharmacologic intervention, bone density on 12/30/2015 under Dr. Matthew Saras showing osteoporosis with T scores between -2.7 and -3., the patient refuses standard Mammography or tomography; undergoing thermography screening of the right breast., PET scan 06/05/2015 shows rectal thickening and a presacral mass; biopsy of this area 10/09/2015 confirms metastatic lobular breast cancer, again estrogen receptor positive, HER-2  nonamplified, pelvic MRI 03/11/2016 confirms stability of disease, chest CT and pelvic MRI 09/02/2016 shows no change in the circumferential rectal thickening or evidence of extension beyond the serosa and chest CT findings, colonoscopy 02/23/2017 showed a very narrow rectal lumen with a few apparently uninvolved centimeters distally ,CT of the abdomen and pelvis 05/22/2017 (after diverting colostomy) shows interval increase in the size of the perirectal soft tissue masses, started fulvestrant and palbociclib 125 mg/ day 21/18 Oct 2015, palbociclib dose decreased to 100 mg daily 21/7 with second cycle, started 11/25/2015, palbociclib discontinued 03/20/2017 with evidence of disease progression, last fulvestrant dose 03/24/2017, discontinued with evidence of progression, s/p colostomy    Patient Stated Goals  to get some of the swelling controllable    Currently in Pain?  Yes    Pain Score  3     Pain Location  -- rectum                       [facility] Adult PT Treatment/Exercise - 10/25/17 0001      Manual Therapy   Manual Lymphatic Drainage (MLD)  instructed pt in the following and had her return demonstrate correct technique and speed: short neck, 5 diaphragmatic breaths, left axillary nodes, establishment of inguino axillary pathway, LLE working proximal to distal then retracing all steps.                  PT Long Term Goals - 10/11/17 1159      PT LONG TERM GOAL #1   Title  Pt will receive appropriate compression garments for management of bilateral LE, genital and abdominal swelling to decrease risk of infection    Time  4    Period  Weeks    Status  New    Target Date  11/08/17      PT LONG TERM GOAL #2   Title  Pt will be independent in self MLD for long term management of lymphedema    Time  4    Period  Weeks    Status  New    Target Date  11/08/17      PT LONG TERM GOAL #3   Title  Pt will be independent in a home exercise program for continued  strengthening and stretching    Time  4    Period  Weeks    Status  New    Target Date  11/08/17      PT LONG TERM GOAL #4   Title  Pt will report a 25% improvmement in bilateral LE, genital and abdominal swelling to allow improved comfort    Time  4    Period  Weeks    Status  New    Target Date  11/08/17            Plan - 10/25/17 1026    Clinical Impression Statement  Instructed pt in self MLD today for LLE. Had pt demonstrate correct technique and speed. Pt was able to do and follow the  handout by end of session. She may need her husband to help her reach her foot. Educated pt to perform self MLD twice a day. Pt to bring in new compression garments at next session to see if they fit.     Rehab Potential  Fair    Clinical Impairments Affecting Rehab Potential  1 kidney functioning, metastatic disease, osteoporosis, genital, abdominal and bilateral LE swelling    PT Frequency  3x / week    PT Duration  4 weeks    PT Treatment/Interventions  ADLs/Self Care Home Management;Therapeutic activities;Therapeutic exercise;Patient/family education;Manual lymph drainage;Manual techniques;Compression bandaging;Passive range of motion;Vasopneumatic Device;Taping;Orthotic Fit/Training    PT Next Visit Plan  assess compression garments for fit, see if results from kidney test are back, continue MLD to bilateral LE with focus on LLE moving fluid towards axillary nodes, see if pt did okay after last session, may begin bandaging at some point if pt tolerates MLD well (would be best to wait on results of kidney test though before beginning bandaging)    Consulted and Agree with Plan of Care  Patient       Patient will benefit from skilled therapeutic intervention in order to improve the following deficits and impairments:  Increased edema, Difficulty walking, Decreased knowledge of precautions, Decreased endurance, Decreased strength, Pain  Visit Diagnosis: Lymphedema, not elsewhere  classified     Problem List Patient Active Problem List   Diagnosis Date Noted  . Hydronephrosis 10/24/2017  . 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 Hyponatremia 10/16/2017  . Protein-calorie malnutrition, severe 07/11/2017  . SBO (small bowel obstruction) ([place]) 06/22/2017  . Protein-calorie malnutrition ([place]) 06/22/2017  . Breast cancer metastasized to multiple sites ([place]) 10/16/2015  . Genetic testing 06/24/2015  . Osteoporosis 12/06/2013  . Cancer of lower-inner quadrant of left female breast ([place]) 03/22/2013  . Breast cancer metastasized to bone [facility]) 10/03/2012    Allyson Sabal [facility] 10/25/2017, 10:30 AM  [place], [place], [address] Phone: [phone]   Fax:  [phone]  Name: Sonia Hill MRN: [id number] Date of Birth: [birth year]  Manus Gunning, PT 10/25/17 10:30 AM

## 2017-10-26 ENCOUNTER — Encounter: Payer: Self-pay | Admitting: Physical Therapy

## 2017-10-26 ENCOUNTER — Ambulatory Visit: Payer: 59 | Admitting: Physical Therapy

## 2017-10-26 ENCOUNTER — Telehealth: Payer: Self-pay | Admitting: *Deleted

## 2017-10-26 DIAGNOSIS — I890 Lymphedema, not elsewhere classified: Secondary | ICD-10-CM | POA: Diagnosis not present

## 2017-10-26 DIAGNOSIS — M6281 Muscle weakness (generalized): Secondary | ICD-10-CM

## 2017-10-26 NOTE — Therapy (Signed)
[place], [place], [address] Phone: [phone]   Fax:  [phone]  Physical Therapy Treatment  Patient Details  Name: Sonia Hill MRN: [id number] Date of Birth: 05/18/1956 Referring Provider: Magrinat   Encounter Date: 10/26/2017  PT End of Session - 10/26/17 1125    Visit Number  8    Number of Visits  13    Date for PT Re-Evaluation  11/08/17    PT Start Time  0923    PT Stop Time  1110    PT Time Calculation (min)  55 min    Activity Tolerance  Patient tolerated treatment well    Behavior During Therapy  [facility] for tasks assessed/performed       Past Medical History:  Diagnosis Date  . Arthritis    neck, upper back, shoulders - no meds - yoga  . Chronic kidney disease    Only has right kidney  . Depression    Hx - no current problem  . GERD (gastroesophageal reflux disease)    diet controlled - no meds  . Hyperlipidemia    diet controlled  . Hypothyroidism   . lt breast ca dx'd 11/2009  . Osteoporosis, unspecified 12/06/2013  . PONV (postoperative nausea and vomiting)   . SVD (spontaneous vaginal delivery)    x 1    Past Surgical History:  Procedure Laterality Date  . BLADDER NECK RECONSTRUCTION     made a new urethea tube  . bladder tact  2008  . BREAST SURGERY     masectomy left breast  . coloscopy    . COLOSTOMY    . DILATION AND CURETTAGE OF UTERUS    . LAPAROSCOPY N/A 01/24/2013   Procedure: LAPROSCOPY OPERATIVE;  Surgeon: Margarette Asal, MD;  Location: [facility];  Service: Gynecology;  Laterality: N/A;  . left kidney removed     left kidney dysfunction  . SALPINGOOPHORECTOMY Bilateral 01/24/2013   Procedure: SALPINGO OOPHORECTOMY;  Surgeon: Margarette Asal, MD;  Location: [facility];  Service: Gynecology;  Laterality: Bilateral;  . WISDOM TOOTH EXTRACTION      There were no vitals filed for this visit.  Subjective Assessment - 10/26/17 1021    Subjective  Pt is  planning to go to the mountains today and to [place] in June which will involve plane flights.  She hopes to have the kidney tests next week and doesn't know when she would be able to see the nephrologist     Pertinent History  BRCA 1-2 negative with lobular breast cancer stage IV at presentation July 2011 status post left mastectomy and sentinel lymph node sampling in July 2011 for a lower inner quadrant T1 N1 M1, stage IV invasive lobular breast cancer, grade 1, strongly estrogen and progesterone receptor-positive, HER2 negative with MIB-1 of 9% and no HER2 amplification, with multiple sclerotic bone lesions at presentation seen only on CT scan (not on bone scan or PET scan), but  with biopsy-proven metastatic disease to bone; and an elevated CA 27.29 at presentation, Oncotype recurrence score of 4, predicts a good response to antiestrogens.Systemic treatment has consisted of tamoxifen with evidence of response but poor tolerance, letrozole starting August 2012, discontinued October 2014 per patient, single functioning kidney, status post bilateral salpingo-oophorectomy 01/24/2013, with benign pathology osteoporosis; the patient refuses bisphosphonate therapy; started [facility] February 2014, bone density was obtained under the care of Dr. Matthew Saras at [facility], 06/25/2012, showing osteoporosis with a T score -  2.6. This was repeated 12/20/2013, showing again osteoporosis with T-scores between -2.4 and -2.8, the patient refuses zolendronate, denosumab, or other pharmacologic intervention, bone density on 12/30/2015 under Dr. Matthew Saras showing osteoporosis with T scores between -2.7 and -3., the patient refuses standard Mammography or tomography; undergoing thermography screening of the right breast., PET scan 06/05/2015 shows rectal thickening and a presacral mass; biopsy of this area 10/09/2015 confirms metastatic lobular breast cancer, again estrogen receptor positive, HER-2  nonamplified, pelvic MRI 03/11/2016 confirms stability of disease, chest CT and pelvic MRI 09/02/2016 shows no change in the circumferential rectal thickening or evidence of extension beyond the serosa and chest CT findings, colonoscopy 02/23/2017 showed a very narrow rectal lumen with a few apparently uninvolved centimeters distally ,CT of the abdomen and pelvis 05/22/2017 (after diverting colostomy) shows interval increase in the size of the perirectal soft tissue masses, started fulvestrant and palbociclib 125 mg/ day 21/18 Oct 2015, palbociclib dose decreased to 100 mg daily 21/7 with second cycle, started 11/25/2015, palbociclib discontinued 03/20/2017 with evidence of disease progression, last fulvestrant dose 03/24/2017, discontinued with evidence of progression, s/p colostomy    Patient Stated Goals  to get some of the swelling controllable    Currently in Pain?  Yes    Pain Score  3     Pain Location  -- rectum                        [facility] Adult PT Treatment/Exercise - 10/26/17 0001      Self-Care   Self-Care  Other Self-Care Comments    Other Self-Care Comments   Pt brings in her new compression stockings ( 30-40 mmHg)  and tried to put them on while instructing pt in use of the [employer].  Stockings were too tight , painful to patient and were not applied all the way on. Pt will hold onto these stockings as she may be able to wear them once her swelling is decreased with compression bandaging once that is approved.  Pt demonstrated good technique with self manual lymph drainage and showed her alternate position by doing it in sitting with foot up on a chair or with left foot crossed over right thigh. Emphasized importance of high elevation ( left up up on back of sofa for example) and exercise along with deep breathing to help remove the fluid in foot and ankle       Manual Therapy   Edema Management  introduced option of night garment vs compression bandaging for long term  management of leg lymphedema     Manual Lymphatic Drainage (MLD)  short neck, 5 diaphragmatic breaths, left axillary nodes, establishment of inguino axillary pathway, LLE working proximal to distal then retracing all steps.      Ankle Exercises: Supine   Other Supine Ankle Exercises  with foot elevated above knee, above hip and above trunk and pt instructed in ankle dorsi/plantarflexion, inversion and eversion and toe extension and flexion, abduction and adduction                   PT Long Term Goals - 10/26/17 1134      PT LONG TERM GOAL #1   Title  Pt will receive appropriate compression garments for management of bilateral LE, genital and abdominal swelling to decrease risk of infection    Time  4    Period  Weeks    Status  On-going      PT LONG TERM  GOAL #2   Title  Pt will be independent in self MLD for long term management of lymphedema    Status  Achieved      PT LONG TERM GOAL #3   Title  Pt will be independent in a home exercise program for continued strengthening and stretching    Time  4    Period  Weeks    Status  On-going      PT LONG TERM GOAL #4   Title  Pt will report a 25% improvmement in bilateral LE, genital and abdominal swelling to allow improved comfort    Time  4    Period  Weeks    Status  On-going            Plan - 10/26/17 1125    Clinical Impression Statement  Pt is doing very well with self manual lymph drainage ( achieved goal for that) , elevation and exercise.  She got a new pain of compression pantyhose but was not able to wear them because they are too tight and painful .  She is looking into getting a cast shoe to prepare for eventual bandaging once she gets medical clearance.  She has some decrease in foot tightness with manual lymph drainage as foot felt looser and able to have more range of motion. She was introduced to the [employer] to help with donning compression garments     Clinical Impairments Affecting Rehab Potential   1 kidney functioning, metastatic disease, osteoporosis, genital, abdominal and bilateral LE swelling    PT Frequency  3x / week    PT Duration  4 weeks    PT Next Visit Plan   see if results from kidney test are back, continue MLD to bilateral LE with focus on LLE moving fluid towards axillary nodes, see if pt did okay after last session, may begin bandaging at some point if pt tolerates MLD well (would be best to wait on results of kidney test though before beginning bandaging) Pt is interested in [employer] program for strengthening     Consulted and Agree with Plan of Care  Patient       Patient will benefit from skilled therapeutic intervention in order to improve the following deficits and impairments:  Increased edema, Difficulty walking, Decreased knowledge of precautions, Decreased endurance, Decreased strength, Pain  Visit Diagnosis: Lymphedema, not elsewhere classified  Muscle weakness (generalized)     Problem List Patient Active Problem List   Diagnosis Date Noted  . Hydronephrosis 10/24/2017  . 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 Hyponatremia 10/16/2017  . Protein-calorie malnutrition, severe 07/11/2017  . SBO (small bowel obstruction) ([place]) 06/22/2017  . Protein-calorie malnutrition ([place]) 06/22/2017  . Breast cancer metastasized to multiple sites ([place]) 10/16/2015  . Genetic testing 06/24/2015  . Osteoporosis 12/06/2013  . Cancer of lower-inner quadrant of left female breast ([place]) 03/22/2013  . Breast cancer metastasized to bone ([place]) 10/03/2012   Donato Heinz. Owens Shark PT  Norwood Levo 10/26/2017, 11:36 AM  [place], [place], [address] Phone: [phone]   Fax:  [phone]  Name: SHAKIRRA BUEHLER MRN: [id number] Date of Birth: [birth year]

## 2017-10-26 NOTE — Telephone Encounter (Signed)
This RN contacted Dr [employer] office- spoke with new pt referral coordinator - Amber. She states office does not work Pharmacist, community.  This RN faxed records requesting an appointment for this patient to [phone].

## 2017-10-26 NOTE — Telephone Encounter (Signed)
Oral Oncology Patient Advocate Encounter  Received confirmation that the patient's initial shipment of Verzenio will arrive at a local [facility] for pickup on 10/26/2017.   [name]. [name], [place] Patient [place] [phone] 10/26/2017 11:20 AM

## 2017-10-30 ENCOUNTER — Ambulatory Visit: Payer: 59 | Admitting: Physical Therapy

## 2017-10-30 ENCOUNTER — Encounter: Payer: Self-pay | Admitting: Physical Therapy

## 2017-10-30 ENCOUNTER — Other Ambulatory Visit: Payer: Self-pay | Admitting: Oncology

## 2017-10-30 DIAGNOSIS — I890 Lymphedema, not elsewhere classified: Secondary | ICD-10-CM | POA: Diagnosis not present

## 2017-10-30 NOTE — Therapy (Signed)
[place], [place], [address] Phone: [phone]   Fax:  [phone]  Physical Therapy Treatment  Patient Details  Name: Sonia Hill MRN: [id number] Date of Birth: November 17, 1955 Referring Provider: Magrinat   Encounter Date: 10/30/2017  PT End of Session - 10/30/17 1218    Visit Number  9    Number of Visits  13    Date for PT Re-Evaluation  11/08/17    PT Start Time  0934    PT Stop Time  1019    PT Time Calculation (min)  45 min    Activity Tolerance  Patient tolerated treatment well    Behavior During Therapy  [facility] for tasks assessed/performed       Past Medical History:  Diagnosis Date  . Arthritis    neck, upper back, shoulders - no meds - yoga  . Chronic kidney disease    Only has right kidney  . Depression    Hx - no current problem  . GERD (gastroesophageal reflux disease)    diet controlled - no meds  . Hyperlipidemia    diet controlled  . Hypothyroidism   . lt breast ca dx'd 11/2009  . Osteoporosis, unspecified 12/06/2013  . PONV (postoperative nausea and vomiting)   . SVD (spontaneous vaginal delivery)    x 1    Past Surgical History:  Procedure Laterality Date  . BLADDER NECK RECONSTRUCTION     made a new urethea tube  . bladder tact  2008  . BREAST SURGERY     masectomy left breast  . coloscopy    . COLOSTOMY    . DILATION AND CURETTAGE OF UTERUS    . LAPAROSCOPY N/A 01/24/2013   Procedure: LAPROSCOPY OPERATIVE;  Surgeon: Margarette Asal, MD;  Location: [facility];  Service: Gynecology;  Laterality: N/A;  . left kidney removed     left kidney dysfunction  . SALPINGOOPHORECTOMY Bilateral 01/24/2013   Procedure: SALPINGO OOPHORECTOMY;  Surgeon: Margarette Asal, MD;  Location: [facility];  Service: Gynecology;  Laterality: Bilateral;  . WISDOM TOOTH EXTRACTION      There were no vitals filed for this visit.  Subjective Assessment - 10/30/17 0937    Subjective  [place] my swelling went down a lot.     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 Matthew Saras at [facility], 06/25/2012, showing osteoporosis with a T score -2.6. This was repeated 12/20/2013, showing again osteoporosis with T-scores between -2.4 and -2.8, the patient refuses zolendronate, denosumab, or other pharmacologic intervention, bone density on 12/30/2015  under Dr. Matthew Saras showing osteoporosis with T scores between -2.7 and -3., the patient refuses standard Mammography or tomography; undergoing thermography screening of the right breast., PET scan 06/05/2015 shows rectal thickening and a presacral mass; biopsy of this area 10/09/2015 confirms metastatic lobular breast cancer, again estrogen receptor positive, HER-2 nonamplified, pelvic MRI 03/11/2016 confirms stability of disease, chest CT and pelvic MRI 09/02/2016 shows no change in the circumferential rectal thickening  or evidence of extension beyond the serosa and chest CT findings, colonoscopy 02/23/2017 showed a very narrow rectal lumen with a few apparently uninvolved centimeters distally ,CT of the abdomen and pelvis 05/22/2017 (after diverting colostomy) shows interval increase in the size of the perirectal soft tissue masses, started fulvestrant and palbociclib 125 mg/ day 21/18 Oct 2015, palbociclib dose decreased to 100 mg daily 21/7 with second cycle, started 11/25/2015, palbociclib discontinued 03/20/2017 with evidence of disease progression, last fulvestrant dose 03/24/2017, discontinued with evidence of progression, s/p colostomy    Patient Stated Goals  to get some of the swelling controllable    Currently in Pain?  Yes    Pain Score  1     Pain Location  -- rectum                       [facility] Adult PT Treatment/Exercise - 10/30/17 0001      Manual Therapy   Manual Lymphatic Drainage (MLD)  short neck, 5 diaphragmatic breaths, left axillary nodes, establishment of inguino axillary pathway, LLE working proximal to distal then retracing all steps.                  PT Long Term Goals - 10/26/17 1134      PT LONG TERM GOAL #1   Title  Pt will receive appropriate compression garments for management of bilateral LE, genital and abdominal swelling to decrease risk of infection    Time  4    Period  Weeks    Status  On-going      PT LONG TERM GOAL #2   Title  Pt will be independent in self MLD for long term management of lymphedema    Status  Achieved      PT LONG TERM GOAL #3   Title  Pt will be independent in a home exercise program for continued strengthening and stretching    Time  4    Period  Weeks    Status  On-going      PT LONG TERM GOAL #4   Title  Pt will report a 25% improvmement in bilateral LE, genital and abdominal swelling to allow improved comfort    Time  4    Period  Weeks    Status  On-going            Plan - 10/30/17 1219    Clinical  Impression Statement  Continued with MLD to LLE today. Still awaiting clearance of kidney prior to beginning bandaging. Pt should be having testing done soon for this. She does feel her ankles are more loose following MLD.     Rehab Potential  Fair    Clinical Impairments Affecting Rehab Potential  1 kidney functioning, metastatic disease, osteoporosis, genital, abdominal and bilateral LE swelling    PT Frequency  3x / week    PT Duration  4 weeks    PT Treatment/Interventions  ADLs/Self Care Home Management;Therapeutic activities;Therapeutic exercise;Patient/family education;Manual lymph drainage;Manual techniques;Compression bandaging;Passive range of motion;Vasopneumatic Device;Taping;Orthotic Fit/Training    PT  Next Visit Plan   see if results from kidney test are back, continue MLD to bilateral LE with focus on LLE moving fluid towards axillary nodes, see if pt did okay after last session, may begin bandaging at some point if pt tolerates MLD well (would be best to wait on results of kidney test though before beginning bandaging) 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 Hydronephrosis 10/24/2017  . 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 Hyponatremia 10/16/2017  . Protein-calorie malnutrition, severe 07/11/2017  . SBO (small bowel obstruction) ([place]) 06/22/2017  . Protein-calorie malnutrition ([place]) 06/22/2017  . Breast cancer metastasized to multiple sites ([place]) 10/16/2015  . Genetic testing 06/24/2015  .  Osteoporosis 12/06/2013  . Cancer of lower-inner quadrant of left female breast ([place]) 03/22/2013  . Breast cancer metastasized to bone [facility]) 10/03/2012    Allyson Sabal [facility] 10/30/2017, 12:22 PM  [place], [place], [address] Phone: [phone]   Fax:  [phone]  Name: DANYELA POSAS MRN: [id number] Date of Birth: [birth year]  Manus Gunning, PT 10/30/17 12:22 PM

## 2017-10-31 NOTE — Telephone Encounter (Signed)
No entry 

## 2017-10-31 NOTE — Progress Notes (Signed)
I saw Sonia Hill in the hospital-- se that note

## 2017-11-01 ENCOUNTER — Encounter: Payer: Self-pay | Admitting: Physical Therapy

## 2017-11-01 ENCOUNTER — Ambulatory Visit: Payer: 59 | Admitting: Physical Therapy

## 2017-11-01 DIAGNOSIS — I890 Lymphedema, not elsewhere classified: Secondary | ICD-10-CM

## 2017-11-01 NOTE — Therapy (Signed)
Progress Note Reporting Period 10/11/17 to 11/01/17  See note below for Objective Data and Assessment of Progress/Goals.      [place], [place], [address] Phone: [phone]   Fax:  [phone]  Physical Therapy Treatment  Patient Details  Name: Sonia Hill MRN: [id number] Date of Birth: [birth year] Referring Provider: Magrinat   Encounter Date: 11/01/2017  PT End of Session - 11/01/17 1013    Visit Number  10    Number of Visits  13    Date for PT Re-Evaluation  11/08/17    PT Start Time  7948 pt arrived late    PT Stop Time  1022    PT Time Calculation (min)  44 min    Activity Tolerance  Patient tolerated treatment well    Behavior During Therapy  [facility] for tasks assessed/performed       Past Medical History:  Diagnosis Date  . Arthritis    neck, upper back, shoulders - no meds - yoga  . Chronic kidney disease    Only has right kidney  . Depression    Hx - no current problem  . GERD (gastroesophageal reflux disease)    diet controlled - no meds  . Hyperlipidemia    diet controlled  . Hypothyroidism   . lt breast ca dx'd 11/2009  . Osteoporosis, unspecified 12/06/2013  . PONV (postoperative nausea and vomiting)   . SVD (spontaneous vaginal delivery)    x 1    Past Surgical History:  Procedure Laterality Date  . BLADDER NECK RECONSTRUCTION     made a new urethea tube  . bladder tact  2008  . BREAST SURGERY     masectomy left breast  . coloscopy    . COLOSTOMY    . DILATION AND CURETTAGE OF UTERUS    . LAPAROSCOPY N/A 01/24/2013   Procedure: LAPROSCOPY OPERATIVE;  Surgeon: Margarette Asal, MD;  Location: [facility];  Service: Gynecology;  Laterality: N/A;  . left kidney removed     left kidney dysfunction  . SALPINGOOPHORECTOMY Bilateral 01/24/2013   Procedure: SALPINGO OOPHORECTOMY;  Surgeon: Margarette Asal, MD;  Location: [facility];  Service: Gynecology;  Laterality:  Bilateral;  . WISDOM TOOTH EXTRACTION      There were no vitals filed for this visit.  Subjective Assessment - 11/01/17 0940    Subjective  The kidney test is scheduled for the 28th.     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 Matthew Saras at [facility], 06/25/2012, showing osteoporosis with a T score -2.6. This was repeated 12/20/2013, showing again  osteoporosis with T-scores between -2.4 and -2.8, the patient refuses zolendronate, denosumab, or other pharmacologic intervention, bone density on 12/30/2015 under Dr. Matthew Saras showing osteoporosis with T scores between -2.7 and -3., the patient refuses standard Mammography or tomography; undergoing thermography screening of the right breast., PET scan 06/05/2015 shows rectal thickening and a presacral mass; biopsy of this area 10/09/2015 confirms metastatic lobular breast cancer, again estrogen receptor positive, HER-2 nonamplified, pelvic MRI  03/11/2016 confirms stability of disease, chest CT and pelvic MRI 09/02/2016 shows no change in the circumferential rectal thickening or evidence of extension beyond the serosa and chest CT findings, colonoscopy 02/23/2017 showed a very narrow rectal lumen with a few apparently uninvolved centimeters distally ,CT of the abdomen and pelvis 05/22/2017 (after diverting colostomy) shows interval increase in the size of the perirectal soft tissue masses, started fulvestrant and palbociclib 125 mg/ day 21/18 Oct 2015, palbociclib dose decreased to 100 mg daily 21/7 with second cycle, started 11/25/2015, palbociclib discontinued 03/20/2017 with evidence of disease progression, last fulvestrant dose 03/24/2017, discontinued with evidence of progression, s/p colostomy    Patient Stated Goals  to get some of the swelling controllable    Currently in Pain?  Yes    Pain Score  1     Pain Location  -- rectum                       [facility] Adult PT Treatment/Exercise - 11/01/17 0001      Manual Therapy   Manual Lymphatic Drainage (MLD)  short neck, 5 diaphragmatic breaths, left axillary nodes, establishment of inguino axillary pathway, LLE working proximal to distal then retracing all steps.                  PT Long Term Goals - 10/26/17 1134      PT LONG TERM GOAL #1   Title  Pt will receive appropriate compression garments for management of bilateral LE, genital and abdominal swelling to decrease risk of infection    Time  4    Period  Weeks    Status  On-going      PT LONG TERM GOAL #2   Title  Pt will be independent in self MLD for long term management of lymphedema    Status  Achieved      PT LONG TERM GOAL #3   Title  Pt will be independent in a home exercise program for continued strengthening and stretching    Time  4    Period  Weeks    Status  On-going      PT LONG TERM GOAL #4   Title  Pt will report a 25% improvmement in bilateral LE, genital and abdominal  swelling to allow improved comfort    Time  4    Period  Weeks    Status  On-going            Plan - 11/01/17 1013    Clinical Impression Statement  Continued with MLD to LLE today with increased time spent at right foot and ankle where pt has increased swelling and tightness. May begin bandaging to RLE depending on results from pt's kidney test.     Rehab Potential  Fair    Clinical Impairments Affecting Rehab Potential  1 kidney functioning, metastatic disease, osteoporosis, genital, abdominal and bilateral LE swelling    PT Frequency  3x / week    PT Duration  4 weeks    PT Treatment/Interventions  ADLs/Self Care Home Management;Therapeutic activities;Therapeutic exercise;Patient/family education;Manual lymph drainage;Manual techniques;Compression bandaging;Passive range of motion;Vasopneumatic Device;Taping;Orthotic Fit/Training    PT Next Visit Plan   see if results from kidney test are back, continue MLD to bilateral LE with focus on LLE moving fluid towards axillary nodes, see if pt did okay after last session, may begin bandaging at some point if pt tolerates MLD well (would be best to wait on results of kidney test though before beginning bandaging) 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 Hydronephrosis 10/24/2017  . 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 Hyponatremia 10/16/2017  . Protein-calorie malnutrition, severe 07/11/2017  . SBO (small bowel obstruction) ([place]) 06/22/2017  .  Protein-calorie malnutrition ([place]) 06/22/2017  . Breast cancer metastasized to multiple sites ([place]) 10/16/2015  . Genetic testing 06/24/2015  . Osteoporosis 12/06/2013  . Cancer of lower-inner quadrant of left female breast ([place]) 03/22/2013  . Breast cancer metastasized to bone [facility]) 10/03/2012    Allyson Sabal [facility] 11/01/2017, 11:13 AM  [address] [place], [place], [address] Phone: [phone]   Fax:  [phone]  Name: Sonia Hill MRN: [id number] Date of Birth: 03/15/1956  Manus Gunning, PT 11/01/17 11:14 AM

## 2017-11-02 ENCOUNTER — Ambulatory Visit: Payer: 59 | Admitting: Physical Therapy

## 2017-11-02 ENCOUNTER — Ambulatory Visit (HOSPITAL_COMMUNITY)
Admission: RE | Admit: 2017-11-02 | Discharge: 2017-11-02 | Disposition: A | Discharge status: Home / Self Care | Payer: 59 | Source: Ambulatory Visit | Attending: Oncology | Admitting: Oncology

## 2017-11-02 ENCOUNTER — Other Ambulatory Visit: Payer: Self-pay

## 2017-11-02 ENCOUNTER — Encounter: Payer: Self-pay | Admitting: Physical Therapy

## 2017-11-02 DIAGNOSIS — I890 Lymphedema, not elsewhere classified: Secondary | ICD-10-CM

## 2017-11-02 DIAGNOSIS — K56609 Unspecified intestinal obstruction, unspecified as to partial versus complete obstruction: Secondary | ICD-10-CM

## 2017-11-02 DIAGNOSIS — C784 Secondary malignant neoplasm of small intestine: Secondary | ICD-10-CM | POA: Diagnosis not present

## 2017-11-02 NOTE — Telephone Encounter (Signed)
Oral Oncology Patient Advocate Encounter  Prior Authorization for Sonia Hill has been approved.    PA# [id number] Effective dates: 10/17/2017 through 10/18/2018  Oral Oncology Clinic will continue to follow.

## 2017-11-02 NOTE — Therapy (Signed)
[place], [place], [address] Phone: [phone]   Fax:  [phone]  Physical Therapy Treatment  Patient Details  Name: Sonia Hill MRN: [id number] Date of Birth: 1955/12/22 Referring Provider: Magrinat   Encounter Date: 11/02/2017  PT End of Session - 11/02/17 1559    Visit Number  11    Number of Visits  13    Date for PT Re-Evaluation  11/08/17    PT Start Time  3875 pt arrived late    PT Stop Time  1602    PT Time Calculation (min)  31 min    Activity Tolerance  Patient tolerated treatment well    Behavior During Therapy  [facility] for tasks assessed/performed       Past Medical History:  Diagnosis Date  . Arthritis    neck, upper back, shoulders - no meds - yoga  . Chronic kidney disease    Only has right kidney  . Depression    Hx - no current problem  . GERD (gastroesophageal reflux disease)    diet controlled - no meds  . Hyperlipidemia    diet controlled  . Hypothyroidism   . lt breast ca dx'd 11/2009  . Osteoporosis, unspecified 12/06/2013  . PONV (postoperative nausea and vomiting)   . SVD (spontaneous vaginal delivery)    x 1    Past Surgical History:  Procedure Laterality Date  . BLADDER NECK RECONSTRUCTION     made a new urethea tube  . bladder tact  2008  . BREAST SURGERY     masectomy left breast  . coloscopy    . COLOSTOMY    . DILATION AND CURETTAGE OF UTERUS    . LAPAROSCOPY N/A 01/24/2013   Procedure: LAPROSCOPY OPERATIVE;  Surgeon: Margarette Asal, MD;  Location: [facility];  Service: Gynecology;  Laterality: N/A;  . left kidney removed     left kidney dysfunction  . SALPINGOOPHORECTOMY Bilateral 01/24/2013   Procedure: SALPINGO OOPHORECTOMY;  Surgeon: Margarette Asal, MD;  Location: [facility];  Service: Gynecology;  Laterality: Bilateral;  . WISDOM TOOTH EXTRACTION      There were no vitals filed for this visit.  Subjective Assessment - 11/02/17 1532     Subjective  I had a rough night last night. I was up most of the night. I think it may be the medicine or my colon. I need to see if I can get in to see the doctor.     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 Matthew Saras at [facility], 06/25/2012, showing osteoporosis with a T score -2.6.  This was repeated 12/20/2013, showing again osteoporosis with T-scores between -2.4 and -2.8, the patient refuses zolendronate, denosumab, or other pharmacologic intervention, bone density on 12/30/2015 under Dr. Matthew Saras showing osteoporosis with T scores between -2.7 and -3., the patient refuses standard Mammography or tomography; undergoing thermography screening of the right breast., PET scan 06/05/2015 shows rectal thickening and a presacral mass; biopsy of this area 10/09/2015 confirms metastatic lobular breast cancer, again estrogen receptor positive, HER-2 nonamplified, pelvic MRI 03/11/2016 confirms  stability of disease, chest CT and pelvic MRI 09/02/2016 shows no change in the circumferential rectal thickening or evidence of extension beyond the serosa and chest CT findings, colonoscopy 02/23/2017 showed a very narrow rectal lumen with a few apparently uninvolved centimeters distally ,CT of the abdomen and pelvis 05/22/2017 (after diverting colostomy) shows interval increase in the size of the perirectal soft tissue masses, started fulvestrant and palbociclib 125 mg/ day 21/18 Oct 2015, palbociclib dose decreased to 100 mg daily 21/7 with second cycle, started 11/25/2015, palbociclib discontinued 03/20/2017 with evidence of disease progression, last fulvestrant dose 03/24/2017, discontinued with evidence of progression, s/p colostomy    Patient Stated Goals  to get some of the swelling controllable    Currently in Pain?  Yes    Pain Score  -- pt reports a lot of nausea    Pain Location  -- stomach                       [facility] Adult PT Treatment/Exercise - 11/02/17 0001      Manual Therapy   Manual Lymphatic Drainage (MLD)  short neck, 5 diaphragmatic breaths, left axillary nodes, establishment of inguino axillary pathway, LLE working proximal to distal then retracing all steps.                  PT Long Term Goals - 10/26/17 1134      PT LONG TERM GOAL #1   Title  Pt will receive appropriate compression garments for management of bilateral LE, genital and abdominal swelling to decrease risk of infection    Time  4    Period  Weeks    Status  On-going      PT LONG TERM GOAL #2   Title  Pt will be independent in self MLD for long term management of lymphedema    Status  Achieved      PT LONG TERM GOAL #3   Title  Pt will be independent in a home exercise program for continued strengthening and stretching    Time  4    Period  Weeks    Status  On-going      PT LONG TERM GOAL #4   Title  Pt will report a 25% improvmement in bilateral LE, genital and  abdominal swelling to allow improved comfort    Time  4    Period  Weeks    Status  On-going            Plan - 11/02/17 1600    Clinical Impression Statement  Continued with MLD to LLE and continued to spend increased time at left foot and ankle where pt has increased swelling. Still waiting on results from kidney test prior to beginning bandaging.     Rehab Potential  Fair    Clinical Impairments Affecting Rehab Potential  1 kidney functioning, metastatic disease, osteoporosis, genital, abdominal and bilateral LE swelling    PT Frequency  3x / week    PT Duration  4 weeks    PT Treatment/Interventions  ADLs/Self Care Home Management;Therapeutic activities;Therapeutic exercise;Patient/family education;Manual lymph drainage;Manual techniques;Compression bandaging;Passive range of motion;Vasopneumatic Device;Taping;Orthotic Fit/Training    PT Next Visit Plan   see if results from kidney test are back, continue MLD to bilateral LE with focus on LLE moving fluid towards axillary nodes, see if pt did okay after last session, may begin bandaging at some point if pt tolerates MLD well (would be best to wait on results of kidney test though before beginning bandaging) 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 Hydronephrosis 10/24/2017  . 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 Hyponatremia 10/16/2017  . Protein-calorie malnutrition, severe 07/11/2017  . SBO (small bowel obstruction) ([place]) 06/22/2017  .  Protein-calorie malnutrition ([place]) 06/22/2017  . Breast cancer metastasized to multiple sites ([place]) 10/16/2015  . Genetic testing 06/24/2015  . Osteoporosis 12/06/2013  . Cancer of lower-inner quadrant of left female breast ([place]) 03/22/2013  . Breast cancer metastasized to bone [facility]) 10/03/2012    Allyson Sabal [facility] 11/02/2017, 4:03 PM  [place], [place], [address] Phone: [phone]   Fax:  [phone]  Name: Sonia Hill MRN: [id number] Date of Birth: [birth year]  Manus Gunning, PT 11/02/17 4:03 PM

## 2017-11-03 ENCOUNTER — Other Ambulatory Visit: Payer: Self-pay | Admitting: Oncology

## 2017-11-03 ENCOUNTER — Other Ambulatory Visit: Payer: Self-pay

## 2017-11-03 ENCOUNTER — Emergency Department (HOSPITAL_COMMUNITY): Payer: 59

## 2017-11-03 ENCOUNTER — Inpatient Hospital Stay (HOSPITAL_COMMUNITY)
Admission: EM | Admit: 2017-11-03 | Discharge: 2017-11-24 | DRG: 329 | Disposition: A | Discharge status: Home Health | Payer: 59 | Attending: Internal Medicine | Admitting: Internal Medicine

## 2017-11-03 ENCOUNTER — Encounter: Payer: 59 | Admitting: Physical Therapy

## 2017-11-03 ENCOUNTER — Encounter (HOSPITAL_COMMUNITY): Payer: Self-pay | Admitting: Emergency Medicine

## 2017-11-03 DIAGNOSIS — M7989 Other specified soft tissue disorders: Secondary | ICD-10-CM | POA: Diagnosis not present

## 2017-11-03 DIAGNOSIS — R9431 Abnormal electrocardiogram [ECG] [EKG]: Secondary | ICD-10-CM | POA: Diagnosis not present

## 2017-11-03 DIAGNOSIS — I447 Left bundle-branch block, unspecified: Secondary | ICD-10-CM | POA: Diagnosis present

## 2017-11-03 DIAGNOSIS — R140 Abdominal distension (gaseous): Secondary | ICD-10-CM

## 2017-11-03 DIAGNOSIS — C801 Malignant (primary) neoplasm, unspecified: Secondary | ICD-10-CM | POA: Diagnosis not present

## 2017-11-03 DIAGNOSIS — J9811 Atelectasis: Secondary | ICD-10-CM | POA: Diagnosis present

## 2017-11-03 DIAGNOSIS — Z803 Family history of malignant neoplasm of breast: Secondary | ICD-10-CM

## 2017-11-03 DIAGNOSIS — R188 Other ascites: Secondary | ICD-10-CM | POA: Diagnosis present

## 2017-11-03 DIAGNOSIS — R748 Abnormal levels of other serum enzymes: Secondary | ICD-10-CM | POA: Diagnosis present

## 2017-11-03 DIAGNOSIS — N136 Pyonephrosis: Secondary | ICD-10-CM | POA: Diagnosis present

## 2017-11-03 DIAGNOSIS — R17 Unspecified jaundice: Secondary | ICD-10-CM | POA: Diagnosis present

## 2017-11-03 DIAGNOSIS — B952 Enterococcus as the cause of diseases classified elsewhere: Secondary | ICD-10-CM | POA: Diagnosis present

## 2017-11-03 DIAGNOSIS — N138 Other obstructive and reflux uropathy: Secondary | ICD-10-CM | POA: Diagnosis present

## 2017-11-03 DIAGNOSIS — F419 Anxiety disorder, unspecified: Secondary | ICD-10-CM | POA: Diagnosis present

## 2017-11-03 DIAGNOSIS — E876 Hypokalemia: Secondary | ICD-10-CM

## 2017-11-03 DIAGNOSIS — Z515 Encounter for palliative care: Secondary | ICD-10-CM | POA: Diagnosis not present

## 2017-11-03 DIAGNOSIS — Z905 Acquired absence of kidney: Secondary | ICD-10-CM

## 2017-11-03 DIAGNOSIS — E43 Unspecified severe protein-calorie malnutrition: Secondary | ICD-10-CM | POA: Diagnosis present

## 2017-11-03 DIAGNOSIS — C50919 Malignant neoplasm of unspecified site of unspecified female breast: Secondary | ICD-10-CM | POA: Diagnosis present

## 2017-11-03 DIAGNOSIS — IMO0002 Reserved for concepts with insufficient information to code with codable children: Secondary | ICD-10-CM

## 2017-11-03 DIAGNOSIS — Z853 Personal history of malignant neoplasm of breast: Secondary | ICD-10-CM

## 2017-11-03 DIAGNOSIS — D509 Iron deficiency anemia, unspecified: Secondary | ICD-10-CM | POA: Diagnosis present

## 2017-11-03 DIAGNOSIS — E162 Hypoglycemia, unspecified: Secondary | ICD-10-CM | POA: Diagnosis present

## 2017-11-03 DIAGNOSIS — Z6822 Body mass index (BMI) 22.0-22.9, adult: Secondary | ICD-10-CM

## 2017-11-03 DIAGNOSIS — E861 Hypovolemia: Secondary | ICD-10-CM | POA: Diagnosis present

## 2017-11-03 DIAGNOSIS — C50912 Malignant neoplasm of unspecified site of left female breast: Secondary | ICD-10-CM | POA: Diagnosis not present

## 2017-11-03 DIAGNOSIS — K219 Gastro-esophageal reflux disease without esophagitis: Secondary | ICD-10-CM | POA: Diagnosis present

## 2017-11-03 DIAGNOSIS — D62 Acute posthemorrhagic anemia: Secondary | ICD-10-CM | POA: Diagnosis present

## 2017-11-03 DIAGNOSIS — E039 Hypothyroidism, unspecified: Secondary | ICD-10-CM | POA: Diagnosis present

## 2017-11-03 DIAGNOSIS — Z933 Colostomy status: Secondary | ICD-10-CM | POA: Diagnosis not present

## 2017-11-03 DIAGNOSIS — M810 Age-related osteoporosis without current pathological fracture: Secondary | ICD-10-CM | POA: Diagnosis present

## 2017-11-03 DIAGNOSIS — Z9889 Other specified postprocedural states: Secondary | ICD-10-CM

## 2017-11-03 DIAGNOSIS — K652 Spontaneous bacterial peritonitis: Secondary | ICD-10-CM | POA: Diagnosis present

## 2017-11-03 DIAGNOSIS — D72829 Elevated white blood cell count, unspecified: Secondary | ICD-10-CM

## 2017-11-03 DIAGNOSIS — Z9012 Acquired absence of left breast and nipple: Secondary | ICD-10-CM | POA: Diagnosis not present

## 2017-11-03 DIAGNOSIS — C784 Secondary malignant neoplasm of small intestine: Principal | ICD-10-CM | POA: Diagnosis present

## 2017-11-03 DIAGNOSIS — Z170 Estrogen receptor positive status [ER+]: Secondary | ICD-10-CM

## 2017-11-03 DIAGNOSIS — C7951 Secondary malignant neoplasm of bone: Secondary | ICD-10-CM | POA: Diagnosis present

## 2017-11-03 DIAGNOSIS — R64 Cachexia: Secondary | ICD-10-CM

## 2017-11-03 DIAGNOSIS — E8770 Fluid overload, unspecified: Secondary | ICD-10-CM | POA: Diagnosis present

## 2017-11-03 DIAGNOSIS — C50312 Malignant neoplasm of lower-inner quadrant of left female breast: Secondary | ICD-10-CM | POA: Diagnosis not present

## 2017-11-03 DIAGNOSIS — N736 Female pelvic peritoneal adhesions (postinfective): Secondary | ICD-10-CM | POA: Diagnosis present

## 2017-11-03 DIAGNOSIS — Z79811 Long term (current) use of aromatase inhibitors: Secondary | ICD-10-CM

## 2017-11-03 DIAGNOSIS — C785 Secondary malignant neoplasm of large intestine and rectum: Secondary | ICD-10-CM | POA: Diagnosis present

## 2017-11-03 DIAGNOSIS — C50911 Malignant neoplasm of unspecified site of right female breast: Secondary | ICD-10-CM

## 2017-11-03 DIAGNOSIS — K56609 Unspecified intestinal obstruction, unspecified as to partial versus complete obstruction: Secondary | ICD-10-CM | POA: Diagnosis not present

## 2017-11-03 DIAGNOSIS — R1084 Generalized abdominal pain: Secondary | ICD-10-CM

## 2017-11-03 DIAGNOSIS — R601 Generalized edema: Secondary | ICD-10-CM | POA: Diagnosis not present

## 2017-11-03 DIAGNOSIS — Q600 Renal agenesis, unilateral: Secondary | ICD-10-CM | POA: Diagnosis not present

## 2017-11-03 DIAGNOSIS — I890 Lymphedema, not elsewhere classified: Secondary | ICD-10-CM | POA: Diagnosis present

## 2017-11-03 DIAGNOSIS — E871 Hypo-osmolality and hyponatremia: Secondary | ICD-10-CM | POA: Diagnosis present

## 2017-11-03 DIAGNOSIS — E440 Moderate protein-calorie malnutrition: Secondary | ICD-10-CM

## 2017-11-03 DIAGNOSIS — N179 Acute kidney failure, unspecified: Secondary | ICD-10-CM

## 2017-11-03 DIAGNOSIS — E46 Unspecified protein-calorie malnutrition: Secondary | ICD-10-CM | POA: Diagnosis not present

## 2017-11-03 DIAGNOSIS — R0602 Shortness of breath: Secondary | ICD-10-CM

## 2017-11-03 DIAGNOSIS — Z90722 Acquired absence of ovaries, bilateral: Secondary | ICD-10-CM | POA: Diagnosis not present

## 2017-11-03 DIAGNOSIS — D649 Anemia, unspecified: Secondary | ICD-10-CM | POA: Diagnosis not present

## 2017-11-03 DIAGNOSIS — J90 Pleural effusion, not elsewhere classified: Secondary | ICD-10-CM

## 2017-11-03 DIAGNOSIS — R066 Hiccough: Secondary | ICD-10-CM | POA: Diagnosis not present

## 2017-11-03 DIAGNOSIS — C786 Secondary malignant neoplasm of retroperitoneum and peritoneum: Secondary | ICD-10-CM | POA: Diagnosis present

## 2017-11-03 DIAGNOSIS — N1330 Unspecified hydronephrosis: Secondary | ICD-10-CM | POA: Diagnosis not present

## 2017-11-03 DIAGNOSIS — R451 Restlessness and agitation: Secondary | ICD-10-CM | POA: Diagnosis not present

## 2017-11-03 DIAGNOSIS — Z79890 Hormone replacement therapy (postmenopausal): Secondary | ICD-10-CM

## 2017-11-03 LAB — COMPREHENSIVE METABOLIC PANEL
ALBUMIN: 1.9 g/dL — AB (ref 3.5–5.0)
ALK PHOS: 38 U/L (ref 38–126)
ALT: 7 U/L — ABNORMAL LOW (ref 14–54)
AST: 17 U/L (ref 15–41)
Anion gap: 6 (ref 5–15)
BUN: 13 mg/dL (ref 6–20)
CALCIUM: 5.6 mg/dL — AB (ref 8.9–10.3)
CHLORIDE: 110 mmol/L (ref 101–111)
CO2: 16 mmol/L — AB (ref 22–32)
CREATININE: 0.58 mg/dL (ref 0.44–1.00)
GFR calc non Af Amer: >60 mL/min (ref >60)
GLUCOSE: 55 mg/dL — AB (ref 65–99)
Potassium: 2.5 mmol/L — CL (ref 3.5–5.1)
SODIUM: 132 mmol/L — AB (ref 135–145)
Total Bilirubin: 0.5 mg/dL (ref 0.3–1.2)
Total Protein: 3.4 g/dL — ABNORMAL LOW (ref 6.5–8.1)

## 2017-11-03 LAB — URINALYSIS, ROUTINE W REFLEX MICROSCOPIC
BACTERIA UA: NONE SEEN
BILIRUBIN URINE: NEGATIVE
Glucose, UA: NEGATIVE mg/dL
Hgb urine dipstick: NEGATIVE
Ketones, ur: 20 mg/dL — AB
Nitrite: NEGATIVE
PH: 5 (ref 5.0–8.0)
Protein, ur: 100 mg/dL — AB
SPECIFIC GRAVITY, URINE: 1.023 (ref 1.005–1.030)

## 2017-11-03 LAB — I-STAT CG4 LACTIC ACID, ED
LACTIC ACID, VENOUS: 0.58 mmol/L (ref 0.5–1.9)
Lactic Acid, Venous: 1.87 mmol/L (ref 0.5–1.9)

## 2017-11-03 LAB — CBC WITH DIFFERENTIAL/PLATELET
Basophils Absolute: 0 10*3/uL (ref 0.0–0.1)
Basophils Relative: 0 %
EOS ABS: 0 10*3/uL (ref 0.0–0.7)
EOS PCT: 0 %
HCT: 41 % (ref 36.0–46.0)
Hemoglobin: 14.3 g/dL (ref 12.0–15.0)
LYMPHS ABS: 0.9 10*3/uL (ref 0.7–4.0)
Lymphocytes Relative: 10 %
MCH: 29.2 pg (ref 26.0–34.0)
MCHC: 34.9 g/dL (ref 30.0–36.0)
MCV: 83.7 fL (ref 78.0–100.0)
MONO ABS: 0.6 10*3/uL (ref 0.1–1.0)
MONOS PCT: 6 %
Neutro Abs: 8 10*3/uL — ABNORMAL HIGH (ref 1.7–7.7)
Neutrophils Relative %: 84 %
PLATELETS: 419 10*3/uL — AB (ref 150–400)
RBC: 4.9 MIL/uL (ref 3.87–5.11)
RDW: 14 % (ref 11.5–15.5)
WBC: 9.5 10*3/uL (ref 4.0–10.5)

## 2017-11-03 LAB — LIPASE, BLOOD: LIPASE: 37 U/L (ref 11–51)

## 2017-11-03 MED ORDER — DEXTROSE 50 % IV SOLN
1.0000 | INTRAVENOUS | Status: AC
  Administered 2017-11-03: 50 mL via INTRAVENOUS
  Filled 2017-11-03: qty 50

## 2017-11-03 MED ORDER — IOHEXOL 300 MG/ML  SOLN
30.0000 mL | Freq: Once | INTRAMUSCULAR | Status: AC | PRN
  Administered 2017-11-03: 30 mL via ORAL

## 2017-11-03 MED ORDER — LORAZEPAM 2 MG/ML IJ SOLN
0.5000 mg | Freq: Four times a day (QID) | INTRAMUSCULAR | Status: DC | PRN
  Administered 2017-11-04 (×2): 0.5 mg via INTRAVENOUS
  Filled 2017-11-03 (×2): qty 1

## 2017-11-03 MED ORDER — SODIUM CHLORIDE 0.9 % IV BOLUS
1000.0000 mL | Freq: Once | INTRAVENOUS | Status: AC
  Administered 2017-11-03: 1000 mL via INTRAVENOUS

## 2017-11-03 MED ORDER — ALBUTEROL SULFATE (2.5 MG/3ML) 0.083% IN NEBU: 2.5000 mg | INHALATION_SOLUTION | Freq: Four times a day (QID) | RESPIRATORY_TRACT | Status: DC | PRN

## 2017-11-03 MED ORDER — SODIUM CHLORIDE 0.9 % IV SOLN
1.0000 g | Freq: Once | INTRAVENOUS | Status: AC
  Administered 2017-11-03: 1 g via INTRAVENOUS
  Filled 2017-11-03: qty 10

## 2017-11-03 MED ORDER — ENOXAPARIN SODIUM 40 MG/0.4ML ~~LOC~~ SOLN
40.0000 mg | Freq: Every day | SUBCUTANEOUS | Status: DC
  Administered 2017-11-08 – 2017-11-09 (×2): 40 mg via SUBCUTANEOUS
  Filled 2017-11-03 (×3): qty 0.4

## 2017-11-03 MED ORDER — POTASSIUM CHLORIDE 10 MEQ/100ML IV SOLN
10.0000 meq | INTRAVENOUS | Status: AC
  Administered 2017-11-03 (×2): 10 meq via INTRAVENOUS
  Filled 2017-11-03 (×2): qty 100

## 2017-11-03 MED ORDER — POTASSIUM CHLORIDE 10 MEQ/100ML IV SOLN
10.0000 meq | INTRAVENOUS | Status: DC
Start: 2017-11-03 — End: 2017-11-04

## 2017-11-03 MED ORDER — ONDANSETRON HCL 4 MG PO TABS: 4.0000 mg | ORAL_TABLET | Freq: Four times a day (QID) | ORAL | Status: DC | PRN

## 2017-11-03 MED ORDER — MORPHINE SULFATE (PF) 2 MG/ML IV SOLN
2.0000 mg | INTRAVENOUS | Status: DC | PRN
  Administered 2017-11-08: 2 mg via INTRAVENOUS
  Filled 2017-11-03: qty 1

## 2017-11-03 MED ORDER — SODIUM CHLORIDE 0.9 % IV SOLN
2.0000 g | Freq: Once | INTRAVENOUS | Status: AC
  Administered 2017-11-03: 2 g via INTRAVENOUS
  Filled 2017-11-03: qty 20

## 2017-11-03 MED ORDER — ONDANSETRON HCL 4 MG/2ML IJ SOLN
4.0000 mg | Freq: Four times a day (QID) | INTRAMUSCULAR | Status: DC | PRN
  Administered 2017-11-08 – 2017-11-11 (×4): 4 mg via INTRAVENOUS
  Filled 2017-11-03 (×3): qty 2

## 2017-11-03 NOTE — ED Notes (Signed)
Lab is coming to [facility] tube.

## 2017-11-03 NOTE — ED Notes (Signed)
Date and time results received: 11/03/17  (use smartphrase ".now" to insert current time)  Test: K+ 2.5 Critical Value: Ca++ 5.6  Name of Provider Notified: Primary RN , Tegeler  Orders Received? Or Actions Taken?: Actions Taken: Notified Arts development officer and EDP

## 2017-11-03 NOTE — ED Triage Notes (Signed)
Pt saw PCP yesterday and had xray done that showed bowel blockage. Pt had nausea, distention. Pt has ostomy and has little output twice.

## 2017-11-03 NOTE — Progress Notes (Signed)
Pt called to see if Dr.Magrinat had seen her abd xray from yesterday. Pt states that she is still having nausea, bloating and pain, but had noticed stool output from her colostomy bag this morning. She states that it is around 40% of her normal output in the morning.   Per Dr.Magrinat review of her abd xray result, pt must remain NPO and proceed to the ED now to manage her SBO with IVF's and possible NG tube placement. Pt expressed frustration but verbalized understanding and will be at [facility] ED today. No further needs at this time.

## 2017-11-03 NOTE — ED Provider Notes (Signed)
[facility] DEPT Provider Note   CSN: [id number] Arrival date & time: 11/03/17  1328     History   Chief Complaint Chief Complaint  Patient presents with  . bowel blockage    HPI Sonia Hill is a 62 y.o. female.  The history is provided by the patient, medical records and the spouse.  Abdominal Pain   This is a new problem. The current episode started more than 2 days ago. The problem occurs daily. The problem has been gradually improving. The pain is associated with eating. The pain is located in the generalized abdominal region. The quality of the pain is pressure-like, cramping and aching. The pain is at a severity of 8/10. The pain is moderate. Associated symptoms include belching, nausea, vomiting and constipation. Pertinent negatives include fever, diarrhea, flatus, dysuria, frequency and headaches. The symptoms are aggravated by palpation and eating. Nothing relieves the symptoms. Past workup includes surgery. Past medical history comments: hx of SBO.    Past Medical History:  Diagnosis Date  . Arthritis    neck, upper back, shoulders - no meds - yoga  . Chronic kidney disease    Only has right kidney  . Depression    Hx - no current problem  . GERD (gastroesophageal reflux disease)    diet controlled - no meds  . Hyperlipidemia    diet controlled  . Hypothyroidism   . lt breast ca dx'd 11/2009  . Osteoporosis, unspecified 12/06/2013  . PONV (postoperative nausea and vomiting)   . SVD (spontaneous vaginal delivery)    x 1    Patient Active Problem List   Diagnosis Date Noted  . Hydronephrosis 10/24/2017  . 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 Hyponatremia 10/16/2017  . Protein-calorie malnutrition, severe 07/11/2017  . SBO (small bowel obstruction) ([place]) 06/22/2017  . Protein-calorie malnutrition ([place]) 06/22/2017  . Breast cancer metastasized to multiple sites ([place]) 10/16/2015  . Genetic  testing 06/24/2015  . Osteoporosis 12/06/2013  . Cancer of lower-inner quadrant of left female breast ([place]) 03/22/2013  . Breast cancer metastasized to bone [facility]) 10/03/2012    Past Surgical History:  Procedure Laterality Date  . BLADDER NECK RECONSTRUCTION     made a new urethea tube  . bladder tact  2008  . BREAST SURGERY     masectomy left breast  . coloscopy    . COLOSTOMY    . DILATION AND CURETTAGE OF UTERUS    . LAPAROSCOPY N/A 01/24/2013   Procedure: LAPROSCOPY OPERATIVE;  Surgeon: Margarette Asal, MD;  Location: [facility];  Service: Gynecology;  Laterality: N/A;  . left kidney removed     left kidney dysfunction  . SALPINGOOPHORECTOMY Bilateral 01/24/2013   Procedure: SALPINGO OOPHORECTOMY;  Surgeon: Margarette Asal, MD;  Location: [facility];  Service: Gynecology;  Laterality: Bilateral;  . WISDOM TOOTH EXTRACTION       OB History   None      Home Medications    Prior to Admission medications   Medication Sig Start Date End Date Taking? Authorizing Provider  abemaciclib (VERZENIO) 150 MG tablet Take 1 tablet (150 mg total) by mouth 2 (two) times daily. Swallow tablets whole. Do not chew, crush, or split tablets before swallowing. 10/18/17   Magrinat, Virgie Dad, MD  ALPRAZolam Duanne Moron) 0.25 MG tablet TAKE 1 TABLET BY MOUTH EVERY 8 HOURS AS NEEDED FOR ANXIETY OR SLEEP 08/28/17   Magrinat, Virgie Dad, MD  cholecalciferol (D-VI-SOL) 400 UNIT/ML LIQD  Take 400 Units by mouth daily.    [provider]  exemestane (AROMASIN) 25 MG tablet Take 1 tablet (25 mg total) by mouth daily after breakfast. 07/05/17   Magrinat, Virgie Dad, MD  gabapentin (NEURONTIN) 300 MG capsule Take 1 capsule (300 mg total) by mouth 3 (three) times daily as needed. Patient taking differently: Take 300 mg by mouth 3 (three) times daily as needed (nerve pain).  06/28/17   Magrinat, Virgie Dad, MD  ibuprofen (ADVIL,MOTRIN) 200 MG tablet Take 200 mg by mouth every 6 (six) hours as needed for moderate pain.     [provider]  ondansetron (ZOFRAN-ODT) 8 MG disintegrating tablet Take 1 tablet (8 mg total) by mouth every 8 (eight) hours as needed for nausea or vomiting. 06/22/17   Magrinat, Virgie Dad, MD  polyethylene glycol (MIRALAX / GLYCOLAX) packet Take 17 g by mouth daily.    [provider]  promethazine (PHENERGAN) 25 MG suppository Place 1 suppository (25 mg total) rectally every 6 (six) hours as needed for nausea or vomiting. Patient not taking: Reported on 09/13/2017 06/22/17   Magrinat, Virgie Dad, MD  simethicone (MYLICON) 132 MG chewable tablet Chew 125 mg by mouth every 6 (six) hours as needed for flatulence.    [provider]  Thyroid (NATURE-THROID) 81.25 MG TABS Take 81.25 mg by mouth daily.    [provider]    Family History Family History  Problem Relation Age of Onset  . Breast cancer Mother 80  . Breast cancer Maternal Aunt 58  . Breast cancer Other        MGF's sister dx with breast cancer in her 52s    Social History Social History   Tobacco Use  . Smoking status: Never Smoker  . Smokeless tobacco: Never Used  Substance Use Topics  . Alcohol use: No  . Drug use: No     Allergies   Adhesive [tape]; Betadine [povidone iodine]; Mercury; and Sulfa antibiotics   Review of Systems Review of Systems  Constitutional: Positive for chills. Negative for diaphoresis, fatigue and fever.  HENT: Negative for congestion.   Respiratory: Negative for cough, chest tightness, shortness of breath and wheezing.   Cardiovascular: Negative for chest pain and palpitations.  Gastrointestinal: Positive for abdominal distention, abdominal pain, constipation, nausea and vomiting. Negative for diarrhea and flatus.  Genitourinary: Negative for dysuria, flank pain and frequency.  Musculoskeletal: Negative for back pain, neck pain and neck stiffness.  Neurological: Negative for light-headedness and headaches.  Psychiatric/Behavioral: Negative for agitation  and confusion.  All other systems reviewed and are negative.    Physical Exam Updated Vital Signs BP (!) 143/84 (BP Location: Right Arm)   Pulse 83   Temp 98.2 F (36.8 C) (Oral)   Resp 16   SpO2 100%   Physical Exam  Constitutional: She appears well-developed and well-nourished. No distress.  HENT:  Head: Normocephalic and atraumatic.  Mouth/Throat: Oropharynx is clear and moist.  Eyes: Pupils are equal, round, and reactive to light. Conjunctivae and EOM are normal.  Neck: Neck supple.  Cardiovascular: Normal rate and regular rhythm.  No murmur heard. Pulmonary/Chest: Effort normal and breath sounds normal. No respiratory distress. She has no wheezes. She exhibits no tenderness.  Abdominal: Soft. She exhibits distension. There is tenderness.  Musculoskeletal: She exhibits no edema.  Neurological: She is alert.  Skin: Skin is warm and dry. Capillary refill takes less than 2 seconds. No rash noted. She is not diaphoretic. No erythema.  Psychiatric: She  has a normal mood and affect.  Nursing note and vitals reviewed.    ED Treatments / Results  Labs (all labs ordered are listed, but only abnormal results are displayed) Labs Reviewed  CBC WITH DIFFERENTIAL/PLATELET - Abnormal; Notable for the following components:      Result Value   Platelets 419 (*)    Neutro Abs 8.0 (*)    All other components within normal limits  URINALYSIS, ROUTINE W REFLEX MICROSCOPIC - Abnormal; Notable for the following components:   APPearance HAZY (*)    Ketones, ur 20 (*)    Protein, ur 100 (*)    Leukocytes, UA TRACE (*)    All other components within normal limits  COMPREHENSIVE METABOLIC PANEL - Abnormal; Notable for the following components:   Sodium 132 (*)    Potassium 2.5 (*)    CO2 16 (*)    Glucose, Bld 55 (*)    Calcium 5.6 (*)    Total Protein 3.4 (*)    Albumin 1.9 (*)    ALT 7 (*)    All other components within normal limits  CBG MONITORING, ED - Abnormal; Notable for  the following components:   Glucose-Capillary 250 (*)    All other components within normal limits  URINE CULTURE  LIPASE, BLOOD  MAGNESIUM  I-STAT CG4 LACTIC ACID, ED  I-STAT CG4 LACTIC ACID, ED    EKG None  Radiology Ct Abdomen Pelvis Wo Contrast  Result Date: 11/03/2017 CLINICAL DATA:  Abdominal distension with decreased ostomy output EXAM: CT ABDOMEN AND PELVIS WITHOUT CONTRAST TECHNIQUE: Multidetector CT imaging of the abdomen and pelvis was performed following the standard protocol without IV contrast. COMPARISON:  CT 07/10/2017, 05/22/2017, radiograph 11/02/2017 FINDINGS: Lower chest: Lung bases demonstrate small bilateral pleural effusions. No consolidation. Normal heart size. Hepatobiliary: No focal liver abnormality is seen. No gallstones, gallbladder wall thickening, or biliary dilatation. Pancreas: Unremarkable. No pancreatic ductal dilatation or surrounding inflammatory changes. Spleen: Normal in size without focal abnormality. Adrenals/Urinary Tract: Right adrenal gland is normal. Mild to moderate right hydronephrosis and hydroureter as before. Absent left kidney. Bladder within normal limits. Stomach/Bowel: Mild-to-moderate fluid distention of stomach. Multiple dilated loops of proximal to mid small bowel, measuring up to 4.5 cm. Abduct transition to decompressed distal small bowel in the right mid to lower abdomen, series 2, image number 43, with minimal contrast within decompressed small bowel loops distal to this. No appreciable contrast in the colon. Status post left lower quadrant double-barrel colostomy. Heterogeneous soft tissue thickening at the anus and rectum, corresponding to previously noted mass. Vascular/Lymphatic: Nonaneurysmal aorta. Limited evaluation for adenopathy due to diffuse abdominal fluid. Reproductive: No adnexal mass. Uterus inseparable from soft tissue mass at the rectum and anus. Other: No free air. Increased abdominal and pelvic ascites, now moderate.  Diffuse subcutaneous edema. Musculoskeletal: No acute or suspicious abnormality. IMPRESSION: 1. Multiple dilated loops of small bowel with abduct transition to decompressed distal small bowel in the mid to lower abdomen slightly to the right of midline, consistent with small bowel obstruction. A small amount of contrast does pass into decompressed small bowel distal to the point of obstruction. 2. Interim development of small pleural effusions. Development of moderate abdominopelvic ascites. Diffuse subcutaneous edema consistent with anasarca 3. Status post left lower quadrant colostomy. Soft tissue enlargement of the anus and rectum, corresponding to previously demonstrated infiltrative mass in the region. 4. Similar mild to moderate right hydronephrosis and slightly dilated right ureter. Electronically Signed   By: Donavan Foil  M.D.   On: 11/03/2017 21:41   Dg Abd 1 View  Result Date: 11/03/2017 CLINICAL DATA:  Small-bowel obstruction EXAM: ABDOMEN - 1 VIEW COMPARISON:  07/14/2017 FINDINGS: Moderate small bowel dilatation. Colon decompressed. Left lower quadrant ostomy. No renal calculi. Normal skeletal structures. IMPRESSION: Small-bowel obstruction. Electronically Signed   By: Franchot Gallo M.D.   On: 11/03/2017 08:44    Procedures Procedures (including critical care time)  CRITICAL CARE Performed by: Gwenyth Allegra Eloyse Causey Total critical care time: 35 minutes Critical care time was exclusive of separately billable procedures and treating other patients. Critical care was necessary to treat or prevent imminent or life-threatening deterioration. Critical care was time spent personally by me on the following activities: development of treatment plan with patient and/or surrogate as well as nursing, discussions with consultants, evaluation of patient's response to treatment, examination of patient, obtaining history from patient or surrogate, ordering and performing treatments and interventions,  ordering and review of laboratory studies, ordering and review of radiographic studies, pulse oximetry and re-evaluation of patient's condition.   Medications Ordered in ED Medications  potassium chloride 10 mEq in 100 mL IVPB (10 mEq Intravenous New Bag/Given 11/03/17 2315)  LORazepam (ATIVAN) injection 0.5 mg (has no administration in time range)  enoxaparin (LOVENOX) injection 40 mg (has no administration in time range)  ondansetron (ZOFRAN) tablet 4 mg (has no administration in time range)    Or  ondansetron (ZOFRAN) injection 4 mg (has no administration in time range)  morphine 2 MG/ML injection 2 mg (has no administration in time range)  albuterol (PROVENTIL) (2.5 MG/3ML) 0.083% nebulizer solution 2.5 mg (has no administration in time range)  potassium chloride 10 mEq in 100 mL IVPB (has no administration in time range)  sodium chloride 0.9 % bolus 1,000 mL (0 mLs Intravenous Stopped 11/03/17 2036)  sodium chloride 0.9 % bolus 1,000 mL (1,000 mLs Intravenous New Bag/Given 11/03/17 1647)  iohexol (OMNIPAQUE) 300 MG/ML solution 30 mL (30 mLs Oral Contrast Given 11/03/17 1849)  calcium gluconate 1 g in sodium chloride 0.9 % 100 mL IVPB (0 g Intravenous Stopped 11/03/17 2326)  dextrose 50 % solution 50 mL (50 mLs Intravenous Given 11/03/17 2348)  calcium gluconate 2 g in sodium chloride 0.9 % 100 mL IVPB (0 g Intravenous Stopped 11/03/17 2343)     Initial Impression / Assessment and Plan / ED Course  I have reviewed the triage vital signs and the nursing notes.  Pertinent labs & imaging results that were available during my care of the patient were reviewed by me and considered in my medical decision making (see chart for details).     Sonia Hill is a 62 y.o. female with a past medical history significant for breast cancer with multiple metastatic sites, colostomy, single kidney, CKD, GERD, hypothyroidism, and depression who presents with symptoms and imaging concerning for small bowel  obstruction.  Patient reports that for the last 5 days she has had on and off abdominal symptoms including abdominal pain and distention.  She says that yesterday it worsened with a swollen abdomen and decreased ostomy output.  She says this feels "the same" as her prior SBO.  She says that she has had nausea and dry heaving and has not been able to tolerate p.o. for the last 2 days.  She has had difficulty taking her medications.  She denies fevers, chills, chest pain or shortness of breath.  She denies any urinary symptoms.  She has minimal rectal output due to a large  rectal mass.  She describes her pain up to 8 out of 10 severity and is currently a 5 out of 10.  She did not want pain medicine initially.  She went to see her PCP yesterday and had x-ray of her abdomen showing small bowel obstruction.  Patient was told to come in today for further evaluation.  Although the patient says her symptoms are improved, with her ability to tolerate p.o. she requested evaluation.  Next  On exam, patient has mild abdominal tenderness diffusely.  She reports is still distended compared to prior but it is improved.  She says that she has had some ostomy output today although it is still not back to completely normal.  She reports continued nausea.  Lungs were clear and chest was nontender.  Patient has swollen bilateral lower extremes which she reports is unchanged from prior.  Patient will screen laboratory testing to look for AKI in the setting of her decreased oral intake.  With her single kidney, will wait to see what her creatinine shows before doing a CT scan.  Patient has tolerated contrast on CT in the past and if her kidneys are appropriate, will obtain contrasted scan.  If not, will obtain noncontrast scan to further evaluate.  Patient will be given fluids for rehydration and will receive nausea pain medicine as she requests.    Anticipate reassessment after work-up.     Work-up shows significant electrolyte  abnormality including potassium of 2.5 and calcium of 5.6.  I am concerned is related to her intolerance of p.o. and dehydration.  These were repleted through IV.  CT scan confirmed small bowel obstruction.  Next  General surgery was called and they will see the patient in the morning but they request admission to medicine service given the other oncology diagnosis and patients electrolyte imbalances.  Hospitalist team was called and patient will be admitted for further management of her small bowel obstruction and dehydration with electrolyte abnormalities.   Final Clinical Impressions(s) / ED Diagnoses   Final diagnoses:  SBO (small bowel obstruction) ([facility])  Hypokalemia  Hypocalcemia  Generalized abdominal pain  Abdominal distension    ED Discharge Orders    None      Clinical Impression: 1. SBO (small bowel obstruction) ([place])   2. Hypokalemia   3. Hypocalcemia   4. Generalized abdominal pain   5. Abdominal distension     Disposition: Admit  This note was prepared with assistance of Dragon voice recognition software. Occasional wrong-word or sound-a-like substitutions may have occurred due to the inherent limitations of voice recognition software.     Dare Sanger, Gwenyth Allegra, MD 11/04/17 [phone]

## 2017-11-03 NOTE — H&P (Signed)
History and Physical    BRIEANNA Hill BHA:[id number] DOB: [birth year] DOA: 11/03/2017  Referring MD/NP/PA: Dr. Harrell Gave Tegeler PCP: [name], MD  Patient coming from: Home  Chief Complaint: Abdominal distention  I have personally briefly reviewed patient's old medical records in [place]   HPI: Sonia Hill is a 62 y.o. female with medical history significant of metastatic breast cancer, s/p colostomy due to rectal mass in November 2018, and SBO; who presents with complaints of abdominal distention and nausea over the last 4 days.  Patient previously had a small bowel obstruction in January which patient was admitted and reports being on a liquid diet since discharge.  Patient reports having tumors in over 6 different places over the last year.  Over this past week she reports having an acute decrease in her appetite with complaints of abdominal gurgling and distention.  Associated symptoms include chills, lower extremity swelling 2/2 lymphedema is chronic, nausea, and dry heaves.  Last night she reported having excruciating pain for 2-1/2 hours that self resolved.  She has had no p.o. intake in last 24 hours and had no output from ostomy for few days until this morning.  Denies any significant fever, dysuria, blood in stool/urine, chest pain, palpitations, or shortness of breath symptoms.  Patient is followed by Dr. [name] of oncology and reports recently been started on Verzenio.  ED Course: Upon admission to the emergency department patient was noted to be afebrile, blood pressure 136/83-185/96, and all other vital signs maintained.  Labs revealed WBC 9.5, platelets 419, potassium 2.5, calcium 5.6, and glucose 55.  UA did not show significant signs of infection.  CT imaging showed worsening cancer with patient was initially given 2 L normal saline IV fluids with 1 g of calcium gluconate.  General surgery was consulted and will see the patient in a.m.  Patient was not noted  to be actively vomiting therefore NG tube was not recommended. TRH called to admit.   Review of Systems  Constitutional: Positive for chills and malaise/fatigue. Negative for fever.  HENT: Negative for congestion and nosebleeds.   Eyes: Negative for photophobia and pain.  Respiratory: Negative for cough and shortness of breath.   Cardiovascular: Positive for leg swelling.  Gastrointestinal: Positive for abdominal pain, constipation and nausea.  Genitourinary: Negative for dysuria and hematuria.  Musculoskeletal: Negative for falls and myalgias.  Skin: Negative for rash.  Neurological: Positive for weakness. Negative for focal weakness.  Psychiatric/Behavioral: Negative for memory loss and substance abuse.    Past Medical History:  Diagnosis Date  . Arthritis    neck, upper back, shoulders - no meds - yoga  . Chronic kidney disease    Only has right kidney  . Depression    Hx - no current problem  . GERD (gastroesophageal reflux disease)    diet controlled - no meds  . Hyperlipidemia    diet controlled  . Hypothyroidism   . lt breast ca dx'd 11/2009  . Osteoporosis, unspecified 12/06/2013  . PONV (postoperative nausea and vomiting)   . SVD (spontaneous vaginal delivery)    x 1    Past Surgical History:  Procedure Laterality Date  . BLADDER NECK RECONSTRUCTION     made a new urethea tube  . bladder tact  2008  . BREAST SURGERY     masectomy left breast  . coloscopy    . COLOSTOMY    . DILATION AND CURETTAGE OF UTERUS    . LAPAROSCOPY N/A 01/24/2013  Procedure: LAPROSCOPY OPERATIVE;  Surgeon: Margarette Asal, MD;  Location: [facility];  Service: Gynecology;  Laterality: N/A;  . left kidney removed     left kidney dysfunction  . SALPINGOOPHORECTOMY Bilateral 01/24/2013   Procedure: SALPINGO OOPHORECTOMY;  Surgeon: Margarette Asal, MD;  Location: [facility];  Service: Gynecology;  Laterality: Bilateral;  . WISDOM TOOTH EXTRACTION       reports that she has never smoked. She  has never used smokeless tobacco. She reports that she does not drink alcohol or use drugs.  Allergies  Allergen Reactions  . Sulfa Antibiotics Rash  . Adhesive [Tape] Rash    Ok to use paper tape and tegaderm over IV site  . Betadine [Povidone Iodine] Rash  . Mercury Rash    Reaction mercurachrome    Family History  Problem Relation Age of Onset  . Breast cancer Mother 37  . Breast cancer Maternal Aunt 58  . Breast cancer Other        MGF's sister dx with breast cancer in her 2s    Prior to Admission medications   Medication Sig Start Date End Date Taking? Authorizing Provider  abemaciclib (VERZENIO) 150 MG tablet Take 1 tablet (150 mg total) by mouth 2 (two) times daily. Swallow tablets whole. Do not chew, crush, or split tablets before swallowing. 10/18/17  Yes Magrinat, Virgie Dad, MD  ALPRAZolam Duanne Moron) 0.25 MG tablet TAKE 1 TABLET BY MOUTH EVERY 8 HOURS AS NEEDED FOR ANXIETY OR SLEEP 08/28/17  Yes Magrinat, Virgie Dad, MD  cholecalciferol (D-VI-SOL) 400 UNIT/ML LIQD Take 400 Units by mouth daily.   Yes [provider]  exemestane (AROMASIN) 25 MG tablet Take 1 tablet (25 mg total) by mouth daily after breakfast. 07/05/17  Yes Magrinat, Virgie Dad, MD  gabapentin (NEURONTIN) 300 MG capsule Take 1 capsule (300 mg total) by mouth 3 (three) times daily as needed. Patient taking differently: Take 300 mg by mouth 3 (three) times daily as needed (nerve pain).  06/28/17  Yes Magrinat, Virgie Dad, MD  ondansetron (ZOFRAN-ODT) 8 MG disintegrating tablet Take 1 tablet (8 mg total) by mouth every 8 (eight) hours as needed for nausea or vomiting. 06/22/17  Yes Magrinat, Virgie Dad, MD  polyethylene glycol (MIRALAX / GLYCOLAX) packet Take 17 g by mouth daily.   Yes [provider]  simethicone (MYLICON) 277 MG chewable tablet Chew 125 mg by mouth every 2 (two) hours as needed for flatulence.    Yes [provider]  Thyroid (NATURE-THROID) 81.25 MG TABS Take 81.25 mg by mouth daily.    Yes [provider]  promethazine (PHENERGAN) 25 MG suppository Place 1 suppository (25 mg total) rectally every 6 (six) hours as needed for nausea or vomiting. Patient not taking: Reported on 09/13/2017 06/22/17   Magrinat, Virgie Dad, MD    Physical Exam:  Constitutional: Thin female who currently appears to be in no acute distress Vitals:   11/03/17 2030 11/03/17 2045 11/03/17 2130 11/03/17 2256  BP: (!) 162/98 (!) 163/87  139/87  Pulse: 87 81 86 84  Resp: 20 17 19 18   Temp:      TempSrc:      SpO2: 100% 100% 98% 98%   Eyes: PERRL, lids and conjunctivae normal ENMT: Mucous membranes are dry posterior pharynx clear of any exudate or lesions.  Neck: normal, supple, no masses, no thyromegaly Respiratory: Clear to auscultation with no significant wheezes or crackles appreciated normal respiratory effort. Cardiovascular: Regular rate and rhythm, no murmurs / rubs /  gallops.  Positive lower extremity edema. 2+ pedal pulses. No carotid bruits.  Abdomen: no tenderness, no masses palpated. No hepatosplenomegaly. Bowel sounds positive.  Musculoskeletal: no clubbing / cyanosis. No joint deformity upper and lower extremities. Good ROM, no contractures. Normal muscle tone.  Skin: no rashes, lesions, ulcers. No induration Neurologic: CN 2-12 grossly intact. Sensation intact, DTR normal. Strength 5/5 in all 4.  Psychiatric: Normal judgment and insight. Alert and oriented x 3. Normal mood.     Labs on Admission: I have personally reviewed following labs and imaging studies  CBC: Recent Labs  Lab 11/03/17 1649  WBC 9.5  NEUTROABS 8.0*  HGB 14.3  HCT 41.0  MCV 83.7  PLT 786*   Basic Metabolic Panel: Recent Labs  Lab 11/03/17 1828  NA 132*  K 2.5*  CL 110  CO2 16*  GLUCOSE 55*  BUN 13  CREATININE 0.58  CALCIUM 5.6*   GFR: CrCl cannot be calculated (Unknown ideal weight.). Liver Function Tests: Recent Labs  Lab 11/03/17 1828  AST 17  ALT 7*  ALKPHOS 38  BILITOT  0.5  PROT 3.4*  ALBUMIN 1.9*   Recent Labs  Lab 11/03/17 1828  LIPASE 37   No results for input(s): AMMONIA in the last 168 hours. Coagulation Profile: No results for input(s): INR, PROTIME in the last 168 hours. Cardiac Enzymes: No results for input(s): CKTOTAL, CKMB, CKMBINDEX, TROPONINI in the last 168 hours. BNP (last 3 results) No results for input(s): PROBNP in the last 8760 hours. HbA1C: No results for input(s): HGBA1C in the last 72 hours. CBG: No results for input(s): GLUCAP in the last 168 hours. Lipid Profile: No results for input(s): CHOL, HDL, LDLCALC, TRIG, CHOLHDL, LDLDIRECT in the last 72 hours. Thyroid Function Tests: No results for input(s): TSH, T4TOTAL, FREET4, T3FREE, THYROIDAB in the last 72 hours. Anemia Panel: No results for input(s): VITAMINB12, FOLATE, FERRITIN, TIBC, IRON, RETICCTPCT in the last 72 hours. Urine analysis:    Component Value Date/Time   COLORURINE YELLOW 11/03/2017 1649   APPEARANCEUR HAZY (A) 11/03/2017 1649   LABSPEC 1.023 11/03/2017 1649   LABSPEC 1.005 08/11/2015 1140   PHURINE 5.0 11/03/2017 1649   GLUCOSEU NEGATIVE 11/03/2017 1649   GLUCOSEU Negative 08/11/2015 1140   HGBUR NEGATIVE 11/03/2017 1649   BILIRUBINUR NEGATIVE 11/03/2017 1649   BILIRUBINUR Negative 08/11/2015 1140   KETONESUR 20 (A) 11/03/2017 1649   PROTEINUR 100 (A) 11/03/2017 1649   UROBILINOGEN 0.2 08/11/2015 1140   NITRITE NEGATIVE 11/03/2017 1649   LEUKOCYTESUR TRACE (A) 11/03/2017 1649   LEUKOCYTESUR Negative 08/11/2015 1140   Sepsis Labs: No results found for this or any previous visit (from the past 240 hour(s)).   Radiological Exams on Admission: Ct Abdomen Pelvis Wo Contrast  Result Date: 11/03/2017 CLINICAL DATA:  Abdominal distension with decreased ostomy output EXAM: CT ABDOMEN AND PELVIS WITHOUT CONTRAST TECHNIQUE: Multidetector CT imaging of the abdomen and pelvis was performed following the standard protocol without IV contrast. COMPARISON:   CT 07/10/2017, 05/22/2017, radiograph 11/02/2017 FINDINGS: Lower chest: Lung bases demonstrate small bilateral pleural effusions. No consolidation. Normal heart size. Hepatobiliary: No focal liver abnormality is seen. No gallstones, gallbladder wall thickening, or biliary dilatation. Pancreas: Unremarkable. No pancreatic ductal dilatation or surrounding inflammatory changes. Spleen: Normal in size without focal abnormality. Adrenals/Urinary Tract: Right adrenal gland is normal. Mild to moderate right hydronephrosis and hydroureter as before. Absent left kidney. Bladder within normal limits. Stomach/Bowel: Mild-to-moderate fluid distention of stomach. Multiple dilated loops of proximal to mid small bowel,  measuring up to 4.5 cm. Abduct transition to decompressed distal small bowel in the right mid to lower abdomen, series 2, image number 43, with minimal contrast within decompressed small bowel loops distal to this. No appreciable contrast in the colon. Status post left lower quadrant double-barrel colostomy. Heterogeneous soft tissue thickening at the anus and rectum, corresponding to previously noted mass. Vascular/Lymphatic: Nonaneurysmal aorta. Limited evaluation for adenopathy due to diffuse abdominal fluid. Reproductive: No adnexal mass. Uterus inseparable from soft tissue mass at the rectum and anus. Other: No free air. Increased abdominal and pelvic ascites, now moderate. Diffuse subcutaneous edema. Musculoskeletal: No acute or suspicious abnormality. IMPRESSION: 1. Multiple dilated loops of small bowel with abduct transition to decompressed distal small bowel in the mid to lower abdomen slightly to the right of midline, consistent with small bowel obstruction. A small amount of contrast does pass into decompressed small bowel distal to the point of obstruction. 2. Interim development of small pleural effusions. Development of moderate abdominopelvic ascites. Diffuse subcutaneous edema consistent with  anasarca 3. Status post left lower quadrant colostomy. Soft tissue enlargement of the anus and rectum, corresponding to previously demonstrated infiltrative mass in the region. 4. Similar mild to moderate right hydronephrosis and slightly dilated right ureter. Electronically Signed   By: Donavan Foil M.D.   On: 11/03/2017 21:41   Dg Abd 1 View  Result Date: 11/03/2017 CLINICAL DATA:  Small-bowel obstruction EXAM: ABDOMEN - 1 VIEW COMPARISON:  07/14/2017 FINDINGS: Moderate small bowel dilatation. Colon decompressed. Left lower quadrant ostomy. No renal calculi. Normal skeletal structures. IMPRESSION: Small-bowel obstruction. Electronically Signed   By: Franchot Gallo M.D.   On: 11/03/2017 08:44    EKG: Independently reviewed.  Sinus rhythm at 86 bpm with first-degree heart block.  Assessment/Plan Small bowel obstruction,  history of small bowel obstruction: Acute.  Patient presents with abdominal distention and nausea symptoms with decreased output from ostomy.  Imaging studies reveal acute small bowel obstruction suspected secondary to malignancy.  General surgery was consulted.  No NGT placed to due to patient not actively vomiting. - Admit to a telemetry - N.p.o. - Antiemetics as needed, - Morphine IV prn pain  - Appreciate general surgery consultative services, will follow-up for further recommendation  Hypoglycemia: Acute.  Initial glucose noted to be 55 on admission.  Question cause of symptoms at this time possibly related to increased metabolic activity of cancer. - Hypoglycemic protocols initiated - Give 1 amp of amp D50 stat, - CBGs every 4 hours overnight - Consider placing patient on IV fluids with dextrose if needed  Hypokalemia: Acute.  Initial potassium 2.5.  Patient was initially ordered 40 mEq to be given IV, but due to poor IV access approximately 25 mEq were given in the ED. - Ordered EKG not initially obtained - Check magnesium level - Give 60 mEq of potassium chloride  IV - Continue monitor and replace electrolytes as needed  Hypocalcemia: Acute.  Initial calcium 5.6.  Patient was given 1 g of calcium gluconate. - Give additional 2 gram of calcium gluconate (estimating correction to 7.1) - Continue to monitor and replace as needed  Nausea, without vomiting.  Patient reports only dry heaving. - Antiemetics as needed   Metastatic breast cancer, rectal metastases s/p colostomy, anasarca: Acute on chronic.  CT imaging without contrast of abdomen pelvis may give question for of disease signs of a small right-sided pleural effusion and anasarca.  Dr. [name] is patient's oncologist.  Patient had recently been started on Verzenio in  addition to Aromasin. - Holding Verzenio and Aromasin - Follow-up with oncology - Will need to discuss goals of care  Solitary kidney, right hydroureteronephrosis: Patient reports being able to urinate without issues and denies any flank pain.   CT shows stable appearance of right kidney. - Continue to monitor  Hyponatremia: Acute.  Sodium 132 suspect secondary to hypovolemia.  Patient was given fluids. - Recheck sodium in a.m.  Lymphedema: Chronic.  Patient currently receiving therapy as an outpatient.  Symptoms secondary to malignancy. - Continue to monitor  DVT prophylaxis: Lovenox Code Status: full Family Communication: Discussed plan of care with the patient family present bedside Disposition Plan: TBD Consults called: Surgery Admission status: Inpatient  Norval Morton MD [facility] Pager [phone]   If 7PM-7AM, please contact night-coverage [email or web address] Password [email or web address]  11/03/2017, 11:18 PM

## 2017-11-03 NOTE — ED Notes (Signed)
Placed on cardiac monitor 

## 2017-11-04 ENCOUNTER — Inpatient Hospital Stay: Payer: Self-pay

## 2017-11-04 ENCOUNTER — Other Ambulatory Visit: Payer: Self-pay

## 2017-11-04 DIAGNOSIS — M810 Age-related osteoporosis without current pathological fracture: Secondary | ICD-10-CM

## 2017-11-04 DIAGNOSIS — Z170 Estrogen receptor positive status [ER+]: Secondary | ICD-10-CM

## 2017-11-04 DIAGNOSIS — K56609 Unspecified intestinal obstruction, unspecified as to partial versus complete obstruction: Secondary | ICD-10-CM

## 2017-11-04 DIAGNOSIS — Z933 Colostomy status: Secondary | ICD-10-CM

## 2017-11-04 DIAGNOSIS — E162 Hypoglycemia, unspecified: Secondary | ICD-10-CM | POA: Diagnosis present

## 2017-11-04 DIAGNOSIS — Z905 Acquired absence of kidney: Secondary | ICD-10-CM

## 2017-11-04 DIAGNOSIS — C50912 Malignant neoplasm of unspecified site of left female breast: Secondary | ICD-10-CM

## 2017-11-04 DIAGNOSIS — N1330 Unspecified hydronephrosis: Secondary | ICD-10-CM

## 2017-11-04 DIAGNOSIS — E876 Hypokalemia: Secondary | ICD-10-CM | POA: Diagnosis present

## 2017-11-04 LAB — BASIC METABOLIC PANEL
Anion gap: 7 (ref 5–15)
BUN: 14 mg/dL (ref 6–20)
CHLORIDE: 99 mmol/L — AB (ref 101–111)
CO2: 20 mmol/L — AB (ref 22–32)
CREATININE: 0.9 mg/dL (ref 0.44–1.00)
Calcium: 9 mg/dL (ref 8.9–10.3)
GFR calc Af Amer: >60 mL/min (ref >60)
GFR calc non Af Amer: >60 mL/min (ref >60)
GLUCOSE: 88 mg/dL (ref 65–99)
Potassium: 4.5 mmol/L (ref 3.5–5.1)
Sodium: 126 mmol/L — ABNORMAL LOW (ref 135–145)

## 2017-11-04 LAB — GLUCOSE, CAPILLARY
GLUCOSE-CAPILLARY: 138 mg/dL — AB (ref 65–99)
Glucose-Capillary: 58 mg/dL — ABNORMAL LOW (ref 65–99)
Glucose-Capillary: 63 mg/dL — ABNORMAL LOW (ref 65–99)
Glucose-Capillary: 72 mg/dL (ref 65–99)
Glucose-Capillary: 78 mg/dL (ref 65–99)
Glucose-Capillary: 91 mg/dL (ref 65–99)

## 2017-11-04 LAB — URINE CULTURE

## 2017-11-04 LAB — CBG MONITORING, ED: GLUCOSE-CAPILLARY: 250 mg/dL — AB (ref 65–99)

## 2017-11-04 LAB — MAGNESIUM: Magnesium: 1.7 mg/dL (ref 1.7–2.4)

## 2017-11-04 MED ORDER — DEXTROSE-NACL 5-0.9 % IV SOLN: INTRAVENOUS | Status: DC

## 2017-11-04 MED ORDER — DEXTROSE-NACL 5-0.9 % IV SOLN
INTRAVENOUS | Status: DC
  Administered 2017-11-04 – 2017-11-05 (×2): via INTRAVENOUS

## 2017-11-04 MED ORDER — SODIUM CHLORIDE 0.9 % IV SOLN
INTRAVENOUS | Status: DC
  Administered 2017-11-04: 02:00:00 via INTRAVENOUS
  Filled 2017-11-04: qty 1000

## 2017-11-04 MED ORDER — SODIUM CHLORIDE 0.9% FLUSH: 10.0000 mL | Freq: Two times a day (BID) | INTRAVENOUS | Status: DC

## 2017-11-04 MED ORDER — KCL IN DEXTROSE-NACL 40-5-0.9 MEQ/L-%-% IV SOLN: INTRAVENOUS | Status: DC

## 2017-11-04 MED ORDER — SODIUM CHLORIDE 0.9% FLUSH
10.0000 mL | INTRAVENOUS | Status: DC | PRN
  Administered 2017-11-07: 10 mL
  Administered 2017-11-08: 20 mL
  Administered 2017-11-11 – 2017-11-24 (×9): 10 mL
  Filled 2017-11-04 (×11): qty 40

## 2017-11-04 MED ORDER — LEVOTHYROXINE SODIUM 100 MCG IV SOLR
50.0000 ug | Freq: Every day | INTRAVENOUS | Status: DC
  Administered 2017-11-04 – 2017-11-17 (×12): 50 ug via INTRAVENOUS
  Filled 2017-11-04 (×15): qty 5

## 2017-11-04 MED ORDER — MAGNESIUM SULFATE 2 GM/50ML IV SOLN
2.0000 g | Freq: Once | INTRAVENOUS | Status: AC
  Administered 2017-11-04: 2 g via INTRAVENOUS
  Filled 2017-11-04: qty 50

## 2017-11-04 MED ORDER — LORAZEPAM 2 MG/ML IJ SOLN
0.5000 mg | Freq: Every evening | INTRAMUSCULAR | Status: DC | PRN
  Administered 2017-11-05 – 2017-11-07 (×3): 0.5 mg via INTRAVENOUS
  Filled 2017-11-04 (×4): qty 1

## 2017-11-04 MED ORDER — POTASSIUM CHLORIDE 10 MEQ/100ML IV SOLN: 10.0000 meq | INTRAVENOUS | Status: DC

## 2017-11-04 MED ORDER — HYDRALAZINE HCL 20 MG/ML IJ SOLN: 10.0000 mg | INTRAMUSCULAR | Status: DC | PRN

## 2017-11-04 MED ORDER — DEXTROSE 50 % IV SOLN
INTRAVENOUS | Status: AC
Start: 2017-11-04 — End: 2017-11-04
  Administered 2017-11-04: 25 mL
  Filled 2017-11-04: qty 50

## 2017-11-04 MED ORDER — DEXTROSE 50 % IV SOLN
25.0000 mL | Freq: Once | INTRAVENOUS | Status: AC
  Administered 2017-11-04: 25 mL via INTRAVENOUS
  Filled 2017-11-04: qty 50

## 2017-11-04 NOTE — Progress Notes (Signed)
CBG 63.  25 ml IV dextrose (50%) given per protocol.  Will recheck CBG in 15 min per protocol.  Margurite Auerbach, RN

## 2017-11-04 NOTE — Progress Notes (Signed)
Paged doctor to place order for PICC line as previous order is incorrect.

## 2017-11-04 NOTE — Progress Notes (Signed)
Paged doctor to inform patients glucose level is 58.

## 2017-11-04 NOTE — Plan of Care (Signed)

## 2017-11-04 NOTE — Progress Notes (Addendum)
Hypoglycemic Event  CBG: 58  Treatment: Dextrose 50% solution 25 mL  Symptoms: Patient denies symptoms at this time  Follow-up CBG: Time: 0715 CBG Result: 91  Possible Reasons for Event: Patient NPO  Comments/MD notified:On call paged and notified     Cathi Roan RN

## 2017-11-04 NOTE — Progress Notes (Signed)
IP PROGRESS NOTE  Subjective:   Ms. Treu was admitted yesterday with abdominal pain and nausea.  She is followed by Dr. [name] for treatment of metastatic breast cancer.  She has a history of recurrent small bowel obstruction.  Her most recent systemic therapy has been exemestane and had Verzenio.  A CT of the abdomen and pelvis on 11/03/2017 revealed multiple dilated loops of small bowel with decompressed small bowel in the mid to lower abdomen to the right of midline.  Stable right hydronephrosis. She reports emptying the colostomy bag twice since hospital admission.  She feels better today.  Objective: Vital signs in last 24 hours: Blood pressure 130/88, pulse 87, temperature 97.9 F (36.6 C), temperature source Oral, resp. rate 18, height 5' (1.524 m), weight 108 lb 11 oz (49.3 kg), SpO2 100 %.  Intake/Output from previous day: 05/24 0701 - 05/25 0700 In: 127.1 [I.V.:127.1] Out: -   Physical Exam:  HEENT: No thrush Abdomen: Soft and nontender, no mass, left lower quadrant colostomy Extremities: Trace pitting edema at the lower leg bilaterally   Lab Results: Recent Labs    11/03/17 1649  WBC 9.5  HGB 14.3  HCT 41.0  PLT 419*    BMET Recent Labs    11/03/17 1828  NA 132*  K 2.5*  CL 110  CO2 16*  GLUCOSE 55*  BUN 13  CREATININE 0.58  CALCIUM 5.6*    Lab Results  Component Value Date   CEA1 130.6 (H) 07/05/2017    Studies/Results: Ct Abdomen Pelvis Wo Contrast  Result Date: 11/03/2017 CLINICAL DATA:  Abdominal distension with decreased ostomy output EXAM: CT ABDOMEN AND PELVIS WITHOUT CONTRAST TECHNIQUE: Multidetector CT imaging of the abdomen and pelvis was performed following the standard protocol without IV contrast. COMPARISON:  CT 07/10/2017, 05/22/2017, radiograph 11/02/2017 FINDINGS: Lower chest: Lung bases demonstrate small bilateral pleural effusions. No consolidation. Normal heart size. Hepatobiliary: No focal liver abnormality is seen. No  gallstones, gallbladder wall thickening, or biliary dilatation. Pancreas: Unremarkable. No pancreatic ductal dilatation or surrounding inflammatory changes. Spleen: Normal in size without focal abnormality. Adrenals/Urinary Tract: Right adrenal gland is normal. Mild to moderate right hydronephrosis and hydroureter as before. Absent left kidney. Bladder within normal limits. Stomach/Bowel: Mild-to-moderate fluid distention of stomach. Multiple dilated loops of proximal to mid small bowel, measuring up to 4.5 cm. Abduct transition to decompressed distal small bowel in the right mid to lower abdomen, series 2, image number 43, with minimal contrast within decompressed small bowel loops distal to this. No appreciable contrast in the colon. Status post left lower quadrant double-barrel colostomy. Heterogeneous soft tissue thickening at the anus and rectum, corresponding to previously noted mass. Vascular/Lymphatic: Nonaneurysmal aorta. Limited evaluation for adenopathy due to diffuse abdominal fluid. Reproductive: No adnexal mass. Uterus inseparable from soft tissue mass at the rectum and anus. Other: No free air. Increased abdominal and pelvic ascites, now moderate. Diffuse subcutaneous edema. Musculoskeletal: No acute or suspicious abnormality. IMPRESSION: 1. Multiple dilated loops of small bowel with abduct transition to decompressed distal small bowel in the mid to lower abdomen slightly to the right of midline, consistent with small bowel obstruction. A small amount of contrast does pass into decompressed small bowel distal to the point of obstruction. 2. Interim development of small pleural effusions. Development of moderate abdominopelvic ascites. Diffuse subcutaneous edema consistent with anasarca 3. Status post left lower quadrant colostomy. Soft tissue enlargement of the anus and rectum, corresponding to previously demonstrated infiltrative mass in the region. 4. Similar mild  to moderate right hydronephrosis  and slightly dilated right ureter. Electronically Signed   By: Donavan Foil M.D.   On: 11/03/2017 21:41   Dg Abd 1 View  Result Date: 11/03/2017 CLINICAL DATA:  Small-bowel obstruction EXAM: ABDOMEN - 1 VIEW COMPARISON:  07/14/2017 FINDINGS: Moderate small bowel dilatation. Colon decompressed. Left lower quadrant ostomy. No renal calculi. Normal skeletal structures. IMPRESSION: Small-bowel obstruction. Electronically Signed   By: Franchot Gallo M.D.   On: 11/03/2017 08:44   [place] Ekg Site Rite  Result Date: 11/04/2017 If Site Rite image not attached, placement could not be confirmed due to current cardiac rhythm.   Medications: I have reviewed the patient's current medications.  Assessment/Plan:  1. metastatic breast cancer, currently being treated with.  Exemestane/Verzenio  2.  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  Single right kidney with hydronephrosis 4.  Osteoporosis   Ms. Nelse is admitted with a partial small bowel obstruction.  She has been seen in consultation by Dr. Christeen Douglas the plan is to continue an n.p.o. status for now.  Systemic treatment for breast cancer is on hold.  Exemestane/Verzenio can be resumed when she is taking a diet.   Recommendations: 1.  Management of the small bowel obstruction per surgery 2.  Hold exemestane/Verzenio until taking a diet  Please call Oncology as needed.  Betsy Coder, MD   11/04/2017, 8:28 AM

## 2017-11-04 NOTE — Consult Note (Signed)
Reason for Consult: Small bowel obstruction Referring Physician: Idelle Reimann is an 62 y.o. female.  HPI: Patient is very pleasant 62 year old female with metastatic lobular carcinoma of the breast.  Status post left mastectomy and sentinel node biopsy in 2011.  About the same time she was found to have extensive bony metastases.  Patient has been through many rounds of chemotherapy and hormonal therapy under the care of Dr. [name] since that time.  She developed metastatic disease to her pelvis and rectum last year.  Underwent laparoscopic diverting loop sigmoid colostomy in [place] last year.  This was from malignant obstruction.  She nor her husband recall whether diffuse carcinomatosis was noted at that time.  However she did pretty well for a while.  She was on hormonal therapy with some side effects.  She recently has developed lower extremity lymphedema.  She was hospitalized in February of this year with a small bowel obstruction that resolved nonsurgically.  She was discharged on a full liquid diet which she has stayed on over the last several months.  On this diet she had no distention or pain or nausea, had normal ostomy output and was gaining weight.  However over about a week prior to this admission she developed the progressive onset of borborygmi and abdominal distention and cramping and pain with severe nausea and reflux for 24 hours prior to this admission.  She continued to have ostomy output although it was decreased.  CT scan as noted below shows a transition point in the right mid abdomen with high-grade bowel obstruction.  Since admission yesterday and n.p.o. status she feels better.  Denies any nausea or cramping or pain today.  She has had ostomy output with her bag being emptied a couple of times of liquid stool.  Still feels a little bloated.  Past Medical History:  Diagnosis Date  . Arthritis    neck, upper back, shoulders - no meds - yoga  . Chronic kidney disease     Only has right kidney  . Depression    Hx - no current problem  . GERD (gastroesophageal reflux disease)    diet controlled - no meds  . Hyperlipidemia    diet controlled  . Hypothyroidism   . lt breast ca dx'd 11/2009  . Osteoporosis, unspecified 12/06/2013  . PONV (postoperative nausea and vomiting)   . SVD (spontaneous vaginal delivery)    x 1    Past Surgical History:  Procedure Laterality Date  . BLADDER NECK RECONSTRUCTION     made a new urethea tube  . bladder tact  2008  . BREAST SURGERY     masectomy left breast  . coloscopy    . COLOSTOMY    . DILATION AND CURETTAGE OF UTERUS    . LAPAROSCOPY N/A 01/24/2013   Procedure: LAPROSCOPY OPERATIVE;  Surgeon: Margarette Asal, MD;  Location: [facility];  Service: Gynecology;  Laterality: N/A;  . left kidney removed     left kidney dysfunction  . SALPINGOOPHORECTOMY Bilateral 01/24/2013   Procedure: SALPINGO OOPHORECTOMY;  Surgeon: Margarette Asal, MD;  Location: [facility];  Service: Gynecology;  Laterality: Bilateral;  . WISDOM TOOTH EXTRACTION      Family History  Problem Relation Age of Onset  . Breast cancer Mother 29  . Breast cancer Maternal Aunt 58  . Breast cancer Other        MGF's sister dx with breast cancer in her 62s    Social History:  reports that  she has never smoked. She has never used smokeless tobacco. She reports that she does not drink alcohol or use drugs.  Allergies:  Allergies  Allergen Reactions  . Sulfa Antibiotics Rash  . Adhesive [Tape] Rash    Ok to use paper tape and tegaderm over IV site  . Betadine [Povidone Iodine] Rash  . Mercury Rash    Reaction mercurachrome    Current Facility-Administered Medications  Medication Dose Route Frequency Provider Last Rate Last Dose  . albuterol (PROVENTIL) (2.5 MG/3ML) 0.083% nebulizer solution 2.5 mg  2.5 mg Nebulization Q6H PRN Smith, Rondell A, MD      . enoxaparin (LOVENOX) injection 40 mg  40 mg Subcutaneous QHS Smith, Rondell A, MD      .  hydrALAZINE (APRESOLINE) injection 10 mg  10 mg Intravenous Q4H PRN Smith, Rondell A, MD      . LORazepam (ATIVAN) injection 0.5 mg  0.5 mg Intravenous Q6H PRN Fuller Plan A, MD   0.5 mg at 11/04/17 0856  . magnesium sulfate IVPB 2 g 50 mL  2 g Intravenous Once Nita Sells, MD      . morphine 2 MG/ML injection 2 mg  2 mg Intravenous Q3H PRN Tamala Julian, Rondell A, MD      . ondansetron (ZOFRAN) tablet 4 mg  4 mg Oral Q6H PRN Norval Morton, MD       Or  . ondansetron (ZOFRAN) injection 4 mg  4 mg Intravenous Q6H PRN Smith, Rondell A, MD      . sodium chloride 0.9 % 1,000 mL with potassium chloride 80 mEq infusion   Intravenous Continuous Norval Morton, MD 125 mL/hr at 11/04/17 0159       Results for orders placed or performed during the hospital encounter of 11/03/17 (from the past 48 hour(s))  CBC with Differential     Status: Abnormal   Collection Time: 11/03/17  4:49 PM  Result Value Ref Range   WBC 9.5 4.0 - 10.5 K/uL   RBC 4.90 3.87 - 5.11 MIL/uL   Hemoglobin 14.3 12.0 - 15.0 g/dL   HCT 41.0 36.0 - 46.0 %   MCV 83.7 78.0 - 100.0 fL   MCH 29.2 26.0 - 34.0 pg   MCHC 34.9 30.0 - 36.0 g/dL   RDW 14.0 11.5 - 15.5 %   Platelets 419 (H) 150 - 400 K/uL   Neutrophils Relative % 84 %   Neutro Abs 8.0 (H) 1.7 - 7.7 K/uL   Lymphocytes Relative 10 %   Lymphs Abs 0.9 0.7 - 4.0 K/uL   Monocytes Relative 6 %   Monocytes Absolute 0.6 0.1 - 1.0 K/uL   Eosinophils Relative 0 %   Eosinophils Absolute 0.0 0.0 - 0.7 K/uL   Basophils Relative 0 %   Basophils Absolute 0.0 0.0 - 0.1 K/uL    Comment: Performed at [facility], [place] [address], [place], [place] [address]  Urinalysis, Routine w reflex microscopic     Status: Abnormal   Collection Time: 11/03/17  4:49 PM  Result Value Ref Range   Color, Urine YELLOW YELLOW   APPearance HAZY (A) CLEAR   Specific Gravity, Urine 1.023 1.005 - 1.030   pH 5.0 5.0 - 8.0   Glucose, UA NEGATIVE NEGATIVE mg/dL   Hgb urine  dipstick NEGATIVE NEGATIVE   Bilirubin Urine NEGATIVE NEGATIVE   Ketones, ur 20 (A) NEGATIVE mg/dL   Protein, ur 100 (A) NEGATIVE mg/dL   Nitrite NEGATIVE NEGATIVE   Leukocytes, UA  TRACE (A) NEGATIVE   RBC / HPF 0-5 0 - 5 RBC/hpf   WBC, UA 21-50 0 - 5 WBC/hpf   Bacteria, UA NONE SEEN NONE SEEN   Squamous Epithelial / LPF 0-5 0 - 5   Mucus PRESENT     Comment: Performed at [facility], [place] [address] [place], [place] [address]  I-Stat CG4 Lactic Acid, ED     Status: None   Collection Time: 11/03/17  5:06 PM  Result Value Ref Range   Lactic Acid, Venous 1.87 0.5 - 1.9 mmol/L  Comprehensive metabolic panel     Status: Abnormal   Collection Time: 11/03/17  6:28 PM  Result Value Ref Range   Sodium 132 (L) 135 - 145 mmol/L   Potassium 2.5 (LL) 3.5 - 5.1 mmol/L    Comment: CRITICAL RESULT CALLED TO, READ BACK BY AND VERIFIED WITH: S.CLAPP AT 1904 ON 11/03/17 BY N.THOMPSON    Chloride 110 101 - 111 mmol/L   CO2 16 (L) 22 - 32 mmol/L   Glucose, Bld 55 (L) 65 - 99 mg/dL   BUN 13 6 - 20 mg/dL   Creatinine, Ser 0.58 0.44 - 1.00 mg/dL   Calcium 5.6 (LL) 8.9 - 10.3 mg/dL    Comment: CRITICAL RESULT CALLED TO, READ BACK BY AND VERIFIED WITH: S.CLAPP AT 1904 ON 11/03/17 BY N.THOMPSON    Total Protein 3.4 (L) 6.5 - 8.1 g/dL   Albumin 1.9 (L) 3.5 - 5.0 g/dL   AST 17 15 - 41 U/L   ALT 7 (L) 14 - 54 U/L   Alkaline Phosphatase 38 38 - 126 U/L   Total Bilirubin 0.5 0.3 - 1.2 mg/dL   GFR calc non Af Amer >60 >60 mL/min   GFR calc Af Amer >60 >60 mL/min    Comment: (NOTE) The eGFR has been calculated using the CKD EPI equation. This calculation has not been validated in all clinical situations. eGFR's persistently <60 mL/min signify possible Chronic Kidney Disease.    Anion gap 6 5 - 15    Comment: Performed at [facility], [place] [address], [place], [place] [address]  Lipase, blood     Status: None   Collection Time: 11/03/17  6:28 PM  Result Value Ref  Range   Lipase 37 11 - 51 U/L    Comment: Performed at [facility], [place] [address], [place], [place] [address]  I-Stat CG4 Lactic Acid, ED     Status: None   Collection Time: 11/03/17  6:31 PM  Result Value Ref Range   Lactic Acid, Venous 0.58 0.5 - 1.9 mmol/L  CBG monitoring, ED     Status: Abnormal   Collection Time: 11/03/17 11:57 PM  Result Value Ref Range   Glucose-Capillary 250 (H) 65 - 99 mg/dL  Magnesium     Status: None   Collection Time: 11/04/17  3:11 AM  Result Value Ref Range   Magnesium 1.7 1.7 - 2.4 mg/dL    Comment: Performed at [facility], [place] [address], [place],  [address]  Glucose, capillary     Status: Abnormal   Collection Time: 11/04/17  6:21 AM  Result Value Ref Range   Glucose-Capillary 58 (L) 65 - 99 mg/dL  Glucose, capillary     Status: None   Collection Time: 11/04/17  7:35 AM  Result Value Ref Range   Glucose-Capillary 91 65 - 99 mg/dL    Ct Abdomen Pelvis Wo Contrast  Result Date:  11/03/2017 CLINICAL DATA:  Abdominal distension with decreased ostomy output EXAM: CT ABDOMEN AND PELVIS WITHOUT CONTRAST TECHNIQUE: Multidetector CT imaging of the abdomen and pelvis was performed following the standard protocol without IV contrast. COMPARISON:  CT 07/10/2017, 05/22/2017, radiograph 11/02/2017 FINDINGS: Lower chest: Lung bases demonstrate small bilateral pleural effusions. No consolidation. Normal heart size. Hepatobiliary: No focal liver abnormality is seen. No gallstones, gallbladder wall thickening, or biliary dilatation. Pancreas: Unremarkable. No pancreatic ductal dilatation or surrounding inflammatory changes. Spleen: Normal in size without focal abnormality. Adrenals/Urinary Tract: Right adrenal gland is normal. Mild to moderate right hydronephrosis and hydroureter as before. Absent left kidney. Bladder within normal limits. Stomach/Bowel: Mild-to-moderate fluid distention of stomach. Multiple dilated loops of  proximal to mid small bowel, measuring up to 4.5 cm. Abduct transition to decompressed distal small bowel in the right mid to lower abdomen, series 2, image number 43, with minimal contrast within decompressed small bowel loops distal to this. No appreciable contrast in the colon. Status post left lower quadrant double-barrel colostomy. Heterogeneous soft tissue thickening at the anus and rectum, corresponding to previously noted mass. Vascular/Lymphatic: Nonaneurysmal aorta. Limited evaluation for adenopathy due to diffuse abdominal fluid. Reproductive: No adnexal mass. Uterus inseparable from soft tissue mass at the rectum and anus. Other: No free air. Increased abdominal and pelvic ascites, now moderate. Diffuse subcutaneous edema. Musculoskeletal: No acute or suspicious abnormality. IMPRESSION: 1. Multiple dilated loops of small bowel with abduct transition to decompressed distal small bowel in the mid to lower abdomen slightly to the right of midline, consistent with small bowel obstruction. A small amount of contrast does pass into decompressed small bowel distal to the point of obstruction. 2. Interim development of small pleural effusions. Development of moderate abdominopelvic ascites. Diffuse subcutaneous edema consistent with anasarca 3. Status post left lower quadrant colostomy. Soft tissue enlargement of the anus and rectum, corresponding to previously demonstrated infiltrative mass in the region. 4. Similar mild to moderate right hydronephrosis and slightly dilated right ureter. Electronically Signed   By: Donavan Foil M.D.   On: 11/03/2017 21:41   Dg Abd 1 View  Result Date: 11/03/2017 CLINICAL DATA:  Small-bowel obstruction EXAM: ABDOMEN - 1 VIEW COMPARISON:  07/14/2017 FINDINGS: Moderate small bowel dilatation. Colon decompressed. Left lower quadrant ostomy. No renal calculi. Normal skeletal structures. IMPRESSION: Small-bowel obstruction. Electronically Signed   By: Franchot Gallo M.D.   On:  11/03/2017 08:44   [place] Ekg Site Rite  Result Date: 11/04/2017 If Site Rite image not attached, placement could not be confirmed due to current cardiac rhythm.   Review of Systems  Constitutional: Negative for chills, fever and weight loss.  Respiratory: Negative.   Cardiovascular: Positive for leg swelling.  Gastrointestinal: Positive for abdominal pain, heartburn and nausea. Negative for blood in stool and vomiting.  Genitourinary: Positive for frequency and urgency.   Blood pressure 130/88, pulse 87, temperature 97.9 F (36.6 C), temperature source Oral, resp. rate 18, height 5' (1.524 m), weight 49.3 kg (108 lb 11 oz), SpO2 100 %. Physical Exam General: Alert, pleasant very thin Caucasian female, in no distress Skin: Warm and dry without rash or infection. HEENT: No palpable masses or thyromegaly. Sclera nonicteric. Pupils equal round and reactive. Oropharynx clear. Lymph nodes: No cervical, supraclavicular, or axillary nodes palpable. Breast: Left chest wall negative.  No masses right breast. Lungs: Breath sounds clear and equal without increased work of breathing Cardiovascular: Regular rate and rhythm without murmur.  There is 2+ lower extremity edema somewhat greater on  the left than the right  Abdomen: Mild to moderate distention. Soft and nontender.  Some fullness palpable in the right mid abdomen. No organomegaly. No palpable hernias.  Ostomy left lower quadrant. Extremities: 2+ bilateral lower extremity edema left greater than right. No chronic venous stasis changes. Neurologic: Alert and fully oriented.  Affect normal.  No gross motor deficits  . Assessment/Plan: 62 year old female with metastatic lobular breast cancer diagnosed 2011, known bony metastases as well as rectal/pelvic/bladder involvement.  Status post diverting loop colostomy.  Now presents with high-grade small bowel obstruction.  Episode of similar obstruction several months ago the resolved nonsurgically.  She  has been asymptomatic over several months although has been on a full liquid diet only.  She is not in any immediate distress today.  Has significant electrolyte abnormalities being addressed by medicine service. CT also shows some ascites and most likely this represents a malignant obstruction.  Could conceivably be an adhesion as she had a sudden obstruction months ago and then no symptoms for several months.  Would continue nonoperative management for now.  N.p.o. except ice chips.  Repeat abdominal x-rays tomorrow.  If she continues to improve would try to avoid surgery but if she does not improve I think an attempt at surgical correction is indicated although she certainly would be at higher risk due to malnutrition.  Discussed with the patient and her husband and all questions answered.  Darene Lamer Nicolena Schurman 11/04/2017, 9:27 AM

## 2017-11-04 NOTE — Progress Notes (Signed)
[facility] PROGRESS NOTE  Sonia Hill GNF:[id number] DOB: 1956-06-02 DOA: 11/03/2017 PCP: [name], MD   Narrative:  7 female-metastatic breast cancer (magrinat)-on Verzenio Colostomy + rectal mass 04/2017-complicated by SBO-recurrent SBO 06/2017 and currently on liquid diet at home  Admit severe excruciating abdominal pain no ostomy output CT = worsening cancer-blood pressures stable Main lab abnormalities potassium 2.5 calcium 5.6 glucose 55 treated initially No NG tube-General surgery and oncology have waited   A & Plan  SBO-defer to general surgery-appreciate input-no NG tube at this time-defer diet to them  Hypoglycemia--sugars are 90--88 so far repeat checks--changing fluids to D5 LR to improve tonicity as sodium is 126  Hypokalemia-magnesium 0.7, giving 1 g IV --potassium is 4.5 Changing fluids as above  Hypothyroidism-if not resolving replace in 24 hours with the same--if persisting low sodium check TSH  Metastatic estrogen positive metastatic lobular breast cancer Metastatic spread to spine and rectal area resulting in blockage and diverting colostomy holding Aromasin, Verzenio-discuss with oncology when available-From last oncology visit it appears she categorically has refuses exemestane/everolimus/exemestane plus-CI-CL IB-she also has beliefs c naturopathy despite a rising CA 27-29-- Dr. [name] her primary oncologist and Dr. Benay Spice input appreciated  Solitary kidney hydronephrosis-unlikely to be nephrotic syndrome consider as outpatient UA to determine proteinuria-follow-up outpatient urology/nephrology-very poor outcome overall and at this juncture I do not think further work-up is indicated from my perspective  Anxiety-giving Ativan very low-dose at bedtime as needed  (Cancer history available in last office note 09/25/2017 as per Dr. [name])   DVT prophylaxis: Lovenox  code Status:  Full CODE STATUS family Communication:  discussed with  the patient's been and sister at the bedside Disposition Plan: Inpatient   Sonia Au, MD  [facility] Direct contact: [phone] --Via amion app OR  --[email or web address]; password [email or web address]  7PM-7AM contact night coverage as above 11/04/2017, 8:12 AM  LOS: 1 day   Consultants:  General surgery  Oncology  Procedures:  None yet  Antimicrobials:  None  Interval history/Subjective:  Doing fair, passing some gas in the bag-no stool however-asking about left lower extremity swelling and methods to manage-also asking about Ativan-sister and husband in room No chest pain-asking about diet  Objective:  Vitals:  Vitals:   11/04/17 0053 11/04/17 0611  BP: (!) 150/86 130/88  Pulse: 75 87  Resp: 16 18  Temp: 97.8 F (36.6 C) 97.9 F (36.6 C)  SpO2: 100% 100%    Exam:  Awake alert oriented no distress frail with by temporalis wasting as well as supraclavicular wasting EOMI NCAT S1-S2 no murmur rub or gallop Abdomen is distended and nontender Neuro is intact she is coherent Left lower extremity swollen  I have personally reviewed the following:   Labs:  Sodium 126 CO2 20 chloride 99  Imaging studies:  Reviewed  Medical tests:  Reviewed  Test discussed with performing physician:  No  Decision to obtain old records:  Yes  Review and summation of old records:  Extensively summarized  Scheduled Meds: . enoxaparin (LOVENOX) injection  40 mg Subcutaneous QHS   Continuous Infusions: . sodium chloride 0.9 % 1,000 mL with potassium chloride 80 mEq infusion 125 mL/hr at 11/04/17 0159    Principal Problem:   Small bowel obstruction (HCC) Active Problems:   Breast cancer metastasized to multiple sites ([place])   Hyponatremia   Hydronephrosis   Solitary kidney   Hypoglycemia   Hypokalemia   Hypocalcemia   LOS: 1 day

## 2017-11-04 NOTE — Progress Notes (Signed)
Peripherally Inserted Central Catheter/Midline Placement  The IV Nurse has discussed with the patient and/or persons authorized to consent for the patient, the purpose of this procedure and the potential benefits and risks involved with this procedure.  The benefits include less needle sticks, lab draws from the catheter, and the patient may be discharged home with the catheter. Risks include, but not limited to, infection, bleeding, blood clot (thrombus formation), and puncture of an artery; nerve damage and irregular heartbeat and possibility to perform a PICC exchange if needed/ordered by physician.  Alternatives to this procedure were also discussed.  Bard Power PICC patient education guide, fact sheet on infection prevention and patient information card has been provided to patient /or left at bedside.    PICC/Midline Placement Documentation  PICC Double Lumen 98/33/82 PICC Right Basilic 32 cm 0 cm (Active)  Indication for Insertion or Continuance of Line Poor Vasculature-patient has had multiple peripheral attempts or PIVs lasting less than 24 hours 11/04/2017 10:34 AM  Exposed Catheter (cm) 0 cm 11/04/2017 10:34 AM  Site Assessment Clean;Dry;Intact 11/04/2017 10:34 AM  Lumen #1 Status Flushed;Saline locked;Blood return noted 11/04/2017 10:34 AM  Lumen #2 Status Flushed;Saline locked;Blood return noted 11/04/2017 10:34 AM  Dressing Type Transparent 11/04/2017 10:34 AM  Dressing Status Clean;Dry;Antimicrobial disc in place;Intact 11/04/2017 10:34 AM  Line Care Connections checked and tightened 11/04/2017 10:34 AM  Line Adjustment (NICU/IV Team Only) No 11/04/2017 10:34 AM  Dressing Intervention New dressing 11/04/2017 10:34 AM  Dressing Change Due 11/11/17 11/04/2017 10:34 AM       Rolena Infante 11/04/2017, 10:35 AM

## 2017-11-05 ENCOUNTER — Inpatient Hospital Stay (HOSPITAL_COMMUNITY): Payer: 59

## 2017-11-05 LAB — CREATININE, URINE, RANDOM: Creatinine, Urine: 155.94 mg/dL

## 2017-11-05 LAB — URINALYSIS, ROUTINE W REFLEX MICROSCOPIC
Bilirubin Urine: NEGATIVE
Glucose, UA: NEGATIVE mg/dL
HGB URINE DIPSTICK: NEGATIVE
KETONES UR: NEGATIVE mg/dL
Nitrite: NEGATIVE
PH: 5 (ref 5.0–8.0)
Protein, ur: 100 mg/dL — AB
Specific Gravity, Urine: 1.016 (ref 1.005–1.030)

## 2017-11-05 LAB — COMPREHENSIVE METABOLIC PANEL
ALT: 10 U/L — AB (ref 14–54)
AST: 25 U/L (ref 15–41)
Albumin: 2.7 g/dL — ABNORMAL LOW (ref 3.5–5.0)
Alkaline Phosphatase: 53 U/L (ref 38–126)
Anion gap: 9 (ref 5–15)
BILIRUBIN TOTAL: 0.5 mg/dL (ref 0.3–1.2)
BUN: 10 mg/dL (ref 6–20)
CO2: 20 mmol/L — ABNORMAL LOW (ref 22–32)
Calcium: 8.6 mg/dL — ABNORMAL LOW (ref 8.9–10.3)
Chloride: 100 mmol/L — ABNORMAL LOW (ref 101–111)
Creatinine, Ser: 0.92 mg/dL (ref 0.44–1.00)
Glucose, Bld: 84 mg/dL (ref 65–99)
Potassium: 3.9 mmol/L (ref 3.5–5.1)
Sodium: 129 mmol/L — ABNORMAL LOW (ref 135–145)
TOTAL PROTEIN: 4.8 g/dL — AB (ref 6.5–8.1)

## 2017-11-05 LAB — CBC WITH DIFFERENTIAL/PLATELET
BASOS ABS: 0 10*3/uL (ref 0.0–0.1)
Basophils Relative: 1 %
Eosinophils Absolute: 0.1 10*3/uL (ref 0.0–0.7)
Eosinophils Relative: 3 %
HEMATOCRIT: 33.4 % — AB (ref 36.0–46.0)
Hemoglobin: 11.2 g/dL — ABNORMAL LOW (ref 12.0–15.0)
LYMPHS ABS: 0.8 10*3/uL (ref 0.7–4.0)
Lymphocytes Relative: 17 %
MCH: 28.4 pg (ref 26.0–34.0)
MCHC: 33.5 g/dL (ref 30.0–36.0)
MCV: 84.6 fL (ref 78.0–100.0)
Monocytes Absolute: 0.2 10*3/uL (ref 0.1–1.0)
Monocytes Relative: 5 %
NEUTROS ABS: 3.4 10*3/uL (ref 1.7–7.7)
NEUTROS PCT: 74 %
PLATELETS: 329 10*3/uL (ref 150–400)
RBC: 3.95 MIL/uL (ref 3.87–5.11)
RDW: 14.1 % (ref 11.5–15.5)
WBC: 4.6 10*3/uL (ref 4.0–10.5)

## 2017-11-05 LAB — GLUCOSE, CAPILLARY
GLUCOSE-CAPILLARY: 80 mg/dL (ref 65–99)
GLUCOSE-CAPILLARY: 81 mg/dL (ref 65–99)
GLUCOSE-CAPILLARY: 87 mg/dL (ref 65–99)
GLUCOSE-CAPILLARY: 107 mg/dL — AB (ref 65–99)
Glucose-Capillary: 71 mg/dL (ref 65–99)
Glucose-Capillary: 71 mg/dL (ref 65–99)
Glucose-Capillary: 124 mg/dL — ABNORMAL HIGH (ref 65–99)

## 2017-11-05 LAB — CORTISOL: Cortisol, Plasma: 16.9 ug/dL

## 2017-11-05 LAB — SODIUM, URINE, RANDOM: Sodium, Ur: 100 mmol/L

## 2017-11-05 LAB — TSH: TSH: 4.31 u[IU]/mL (ref 0.350–4.500)

## 2017-11-05 MED ORDER — SODIUM BICARBONATE 8.4 % IV SOLN
INTRAVENOUS | Status: DC
  Administered 2017-11-05 – 2017-11-06 (×2): via INTRAVENOUS
  Filled 2017-11-05 (×2): qty 1000
  Filled 2017-11-05 (×3): qty 850
  Filled 2017-11-05: qty 1000

## 2017-11-05 NOTE — Progress Notes (Signed)
[facility] PROGRESS NOTE  Sonia Hill ASN:[id number] DOB: 05/22/1956 DOA: 11/03/2017 PCP: [name], MD   Narrative:  18 female-metastatic breast cancer (magrinat)-on Verzenio Colostomy + rectal mass 04/2017-complicated by SBO-recurrent SBO 06/2017 and currently on liquid diet at home  Admit severe excruciating abdominal pain no ostomy output CT = worsening cancer-blood pressures stable Main lab abnormalities potassium 2.5 calcium 5.6 glucose 55 treated initially No NG tube-General surgery and oncology have consulted   A & Plan  SBO-defer to general surgery-appreciate input-no NG tube at this time-n.p.o. for this time passing some gas into the bag but x-ray repeat still shows partial SBO  Possible hypervolemic HYPONATREMIA, Hypoglycemia--sugars are 90--88 so far repeat checks--discussed with Dr. Carolin Sicks of nephrology-curb sided appreciate input-changing IV fluid to D10 with bicarb 3 A +20 of K repeat labs a.m. Obtain UA, UNA, Ucre, TSH, cortisol  Hypokalemia-magnesium 0.7, giving 1 g IV --potassium is 3.9 Changing fluids as above  Hypothyroidism-if not resolving replace in 24 hours with the same- as IV  Metastatic estrogen positive metastatic lobular breast cancer Metastatic spread to spine and rectal area resulting in blockage and diverting colostomy holding chemotherapy at this time Dr. [name] her primary oncologist and Dr. Benay Spice input appreciated  Solitary kidney hydronephrosis-unlikely to be nephrotic syndrome -hypervolemia hyponatremia points to poor oncotic pressure albumin is 2  Anxiety-giving Ativan very low-dose at bedtime as needed  (Cancer history available in last office note 09/25/2017 as per Dr. [name])   DVT prophylaxis: Lovenox  code Status:  Full CODE STATUS family Communication:  discussed with the patient's Husband and sister at the bedside Disposition Plan: Inpatient   Sonia Au, MD  [facility] Direct contact:  [phone] --Via Central  --[email or web address]; password [email or web address]  7PM-7AM contact night coverage as above 11/05/2017, 1:36 PM  LOS: 2 days   Consultants:  General surgery  Oncology  Procedures:  None yet  Antimicrobials:  None  Interval history/Subjective:  Hungry-wants to drink-understands rationale for n.p.o. status-husband with many questions including questions about TPN  Objective:  Vitals:  Vitals:   11/04/17 2011 11/05/17 0517  BP: (!) 157/83 136/68  Pulse: 89 75  Resp: 20 18  Temp: 98.2 F (36.8 C) 97.8 F (36.6 C)  SpO2: 100% 99%    Exam:  Awake alert oriented no distress frail with by temporalis wasting as well as supraclavicular wasting EOMI NCAT S1-S2 no murmur rub or gallop Abdomen is distended and nontender-ostomy has gas and it Neuro is intact she is coherent Left lower extremity swollen  I have personally reviewed the following:   Labs:  Sodium 126--->129 CO2 20 chloride 99  Cbc 14-->11  Imaging studies:  Reviewed  Medical tests:  Reviewed  Test discussed with performing physician:  No  Decision to obtain old records:  Yes  Review and summation of old records:  Extensively summarized  Scheduled Meds: . enoxaparin (LOVENOX) injection  40 mg Subcutaneous QHS  . levothyroxine  50 mcg Intravenous Daily   Continuous Infusions: . dextrose 10 % 1,000 mL with potassium chloride 20 mEq, sodium bicarbonate 150 mEq infusion    . dextrose 5 % and 0.9% NaCl 75 mL/hr at 11/05/17 0518    Principal Problem:   Small bowel obstruction (HCC) Active Problems:   Breast cancer metastasized to multiple sites [facility])   Hyponatremia   Hydronephrosis   Solitary kidney   Hypoglycemia   Hypokalemia   Hypocalcemia   LOS: 2 days

## 2017-11-05 NOTE — Progress Notes (Signed)
Pt called RN to room stating her abdomen felt more distended and firm, particularly on the right lower side. 1+ edema noted. She states that it is "not gas feeling". No c/o nausea. Abdomen slightly tender in lower abdomen. Voided a small amount and felt like bladder might be "being pressed on". Bladder scan showed >324ml. Wants to avoid a foley. MD notified. Eulas Post, RN

## 2017-11-05 NOTE — Progress Notes (Signed)
Patient ID: Sonia Hill, female   DOB: Feb 09, 1956, 62 y.o.   MRN: [id number]     Subjective: She feels better today.  Denies any abdominal pain or nausea.  Had at least one bowel movement per colostomy yesterday.  She is hungry.  Does not feel particularly distended.  Objective: Vital signs in last 24 hours: Temp:  [97.8 F (36.6 C)-98.2 F (36.8 C)] 97.8 F (36.6 C) (05/26 0517) Pulse Rate:  [75-89] 75 (05/26 0517) Resp:  [14-20] 18 (05/26 0517) BP: (127-157)/(68-83) 136/68 (05/26 0517) SpO2:  [98 %-100 %] 99 % (05/26 0517) Last BM Date: 11/04/17  Intake/Output from previous day: 05/25 0701 - 05/26 0700 In: 1532.5 [I.V.:1532.5] Out: 850 [Urine:850] Intake/Output this shift: No intake/output data recorded.  General appearance: alert, cooperative and no distress GI: normal findings: soft, non-tender and minimal distention  Lab Results:  Recent Labs    11/03/17 1649 11/05/17 0414  WBC 9.5 4.6  HGB 14.3 11.2*  HCT 41.0 33.4*  PLT 419* 329   BMET Recent Labs    11/04/17 0841 11/05/17 0414  NA 126* 129*  K 4.5 3.9  CL 99* 100*  CO2 20* 20*  GLUCOSE 88 84  BUN 14 10  CREATININE 0.90 0.92  CALCIUM 9.0 8.6*     Studies/Results: Ct Abdomen Pelvis Wo Contrast  Result Date: 11/03/2017 CLINICAL DATA:  Abdominal distension with decreased ostomy output EXAM: CT ABDOMEN AND PELVIS WITHOUT CONTRAST TECHNIQUE: Multidetector CT imaging of the abdomen and pelvis was performed following the standard protocol without IV contrast. COMPARISON:  CT 07/10/2017, 05/22/2017, radiograph 11/02/2017 FINDINGS: Lower chest: Lung bases demonstrate small bilateral pleural effusions. No consolidation. Normal heart size. Hepatobiliary: No focal liver abnormality is seen. No gallstones, gallbladder wall thickening, or biliary dilatation. Pancreas: Unremarkable. No pancreatic ductal dilatation or surrounding inflammatory changes. Spleen: Normal in size without focal abnormality. Adrenals/Urinary  Tract: Right adrenal gland is normal. Mild to moderate right hydronephrosis and hydroureter as before. Absent left kidney. Bladder within normal limits. Stomach/Bowel: Mild-to-moderate fluid distention of stomach. Multiple dilated loops of proximal to mid small bowel, measuring up to 4.5 cm. Abduct transition to decompressed distal small bowel in the right mid to lower abdomen, series 2, image number 43, with minimal contrast within decompressed small bowel loops distal to this. No appreciable contrast in the colon. Status post left lower quadrant double-barrel colostomy. Heterogeneous soft tissue thickening at the anus and rectum, corresponding to previously noted mass. Vascular/Lymphatic: Nonaneurysmal aorta. Limited evaluation for adenopathy due to diffuse abdominal fluid. Reproductive: No adnexal mass. Uterus inseparable from soft tissue mass at the rectum and anus. Other: No free air. Increased abdominal and pelvic ascites, now moderate. Diffuse subcutaneous edema. Musculoskeletal: No acute or suspicious abnormality. IMPRESSION: 1. Multiple dilated loops of small bowel with abduct transition to decompressed distal small bowel in the mid to lower abdomen slightly to the right of midline, consistent with small bowel obstruction. A small amount of contrast does pass into decompressed small bowel distal to the point of obstruction. 2. Interim development of small pleural effusions. Development of moderate abdominopelvic ascites. Diffuse subcutaneous edema consistent with anasarca 3. Status post left lower quadrant colostomy. Soft tissue enlargement of the anus and rectum, corresponding to previously demonstrated infiltrative mass in the region. 4. Similar mild to moderate right hydronephrosis and slightly dilated right ureter. Electronically Signed   By: Donavan Foil M.D.   On: 11/03/2017 21:41   [place] Ekg Site Rite  Result Date: 11/04/2017 If [employer]  not attached, placement could not be confirmed due to  current cardiac rhythm.   Anti-infectives: Anti-infectives (From admission, onward)   None      Assessment/Plan: Small bowel obstruction.  Metastatic breast cancer with known involvement of rectum and pelvis.  CT showed discrete area of transition in right mid abdomen.  History of same several months ago the resolved without surgery.  She clinically seems better today.  Abdominal x-rays have not yet been done today.  Continue n.p.o. for now and follow-up on these.    LOS: 2 days    Sonia Hill 11/05/2017

## 2017-11-05 NOTE — Progress Notes (Signed)
Lab notified this RN that patients hemoglobin went from 14.3 on 5/24 to 11.2 this a.m. Jeannette Corpus, NP made aware. No overt bleeding.

## 2017-11-06 LAB — RENAL FUNCTION PANEL
ALBUMIN: 2.5 g/dL — AB (ref 3.5–5.0)
Anion gap: 8 (ref 5–15)
BUN: 8 mg/dL (ref 6–20)
CHLORIDE: 96 mmol/L — AB (ref 101–111)
CO2: 26 mmol/L (ref 22–32)
CREATININE: 0.91 mg/dL (ref 0.44–1.00)
Calcium: 8.3 mg/dL — ABNORMAL LOW (ref 8.9–10.3)
Glucose, Bld: 99 mg/dL (ref 65–99)
PHOSPHORUS: 2.4 mg/dL — AB (ref 2.5–4.6)
POTASSIUM: 3.6 mmol/L (ref 3.5–5.1)
Sodium: 130 mmol/L — ABNORMAL LOW (ref 135–145)

## 2017-11-06 LAB — CBC WITH DIFFERENTIAL/PLATELET
BASOS PCT: 0 %
Basophils Absolute: 0 10*3/uL (ref 0.0–0.1)
Eosinophils Absolute: 0.1 10*3/uL (ref 0.0–0.7)
Eosinophils Relative: 3 %
HCT: 31.6 % — ABNORMAL LOW (ref 36.0–46.0)
Hemoglobin: 10.9 g/dL — ABNORMAL LOW (ref 12.0–15.0)
LYMPHS ABS: 0.9 10*3/uL (ref 0.7–4.0)
Lymphocytes Relative: 22 %
MCH: 28.9 pg (ref 26.0–34.0)
MCHC: 34.5 g/dL (ref 30.0–36.0)
MCV: 83.8 fL (ref 78.0–100.0)
MONOS PCT: 4 %
Monocytes Absolute: 0.2 10*3/uL (ref 0.1–1.0)
NEUTROS PCT: 71 %
Neutro Abs: 2.9 10*3/uL (ref 1.7–7.7)
PLATELETS: 319 10*3/uL (ref 150–400)
RBC: 3.77 MIL/uL — ABNORMAL LOW (ref 3.87–5.11)
RDW: 14.1 % (ref 11.5–15.5)
WBC: 4 10*3/uL (ref 4.0–10.5)

## 2017-11-06 LAB — COMPREHENSIVE METABOLIC PANEL
ALBUMIN: 2.4 g/dL — AB (ref 3.5–5.0)
ALT: 10 U/L — ABNORMAL LOW (ref 14–54)
ANION GAP: 7 (ref 5–15)
AST: 26 U/L (ref 15–41)
Alkaline Phosphatase: 48 U/L (ref 38–126)
BUN: 8 mg/dL (ref 6–20)
CALCIUM: 8.2 mg/dL — AB (ref 8.9–10.3)
CO2: 26 mmol/L (ref 22–32)
Chloride: 97 mmol/L — ABNORMAL LOW (ref 101–111)
Creatinine, Ser: 0.91 mg/dL (ref 0.44–1.00)
GFR calc non Af Amer: >60 mL/min (ref >60)
GLUCOSE: 100 mg/dL — AB (ref 65–99)
Potassium: 3.6 mmol/L (ref 3.5–5.1)
SODIUM: 130 mmol/L — AB (ref 135–145)
Total Bilirubin: 0.4 mg/dL (ref 0.3–1.2)
Total Protein: 4.4 g/dL — ABNORMAL LOW (ref 6.5–8.1)

## 2017-11-06 LAB — GLUCOSE, CAPILLARY
GLUCOSE-CAPILLARY: 99 mg/dL (ref 65–99)
GLUCOSE-CAPILLARY: 110 mg/dL — AB (ref 65–99)
GLUCOSE-CAPILLARY: 96 mg/dL (ref 65–99)
Glucose-Capillary: 115 mg/dL — ABNORMAL HIGH (ref 65–99)
Glucose-Capillary: 96 mg/dL (ref 65–99)

## 2017-11-06 MED ORDER — FUROSEMIDE 10 MG/ML IJ SOLN
20.0000 mg | Freq: Once | INTRAMUSCULAR | Status: AC
  Administered 2017-11-06: 20 mg via INTRAVENOUS
  Filled 2017-11-06: qty 2

## 2017-11-06 NOTE — Progress Notes (Signed)
IP PROGRESS NOTE  Subjective:   Sonia Hill reports passing gas in the ostomy bag.  No stool.  She would like to try a liquid diet.  Objective: Vital signs in last 24 hours: Blood pressure 115/62, pulse 70, temperature 97.7 F (36.5 C), temperature source Oral, resp. rate 16, height 5' (1.524 m), weight 108 lb 11 oz (49.3 kg), SpO2 100 %.  Intake/Output from previous day: 05/26 0701 - 05/27 0700 In: 3363.3 [I.V.:3363.3] Out: 825 [Urine:825]  Physical Exam:   Abdomen: Mildly distended, nontender Extremities: Trace pitting edema at the lower leg bilaterally and waist  Lab Results: Recent Labs    11/05/17 0414 11/06/17 0421  WBC 4.6 4.0  HGB 11.2* 10.9*  HCT 33.4* 31.6*  PLT 329 319    BMET Recent Labs    11/05/17 0414 11/06/17 0421  NA 129* 130*  130*  K 3.9 3.6  3.6  CL 100* 97*  96*  CO2 20* 26  26  GLUCOSE 84 100*  99  BUN 10 8  8   CREATININE 0.92 0.91  0.91  CALCIUM 8.6* 8.2*  8.3*    Lab Results  Component Value Date   CEA1 130.6 (H) 07/05/2017    Studies/Results: Dg Abd 2 Views  Result Date: 11/05/2017 CLINICAL DATA:  Per order- small bowel obstruction PT HX: GERD, bladder sugery EXAM: ABDOMEN - 2 VIEW COMPARISON:  CT 11/03/2017 and previous FINDINGS: Visualized lung bases clear.  No free air. Multiple dilated small bowel loops with fluid levels on the erect radiograph. Similar number of involved loops and degree of dilatation since previous radiograph 11/02/2017. There has been progression of oral contrast material into the proximal colon which is nondilated. There is a left lower quadrant ostomy device. No abnormal abdominal calcifications.  Regional bones unremarkable. IMPRESSION: 1. Findings consistent with persistent partial mid/distal small bowel obstruction. 2. No free air. Electronically Signed   By: Lucrezia Europe M.D.   On: 11/05/2017 12:23    Medications: I have reviewed the patient's current medications.  Assessment/Plan:  1.  metastatic breast cancer, treated with exemestane/Verzenio prior to hospital admission 2.  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  Single right kidney with hydronephrosis 4.  Osteoporosis   Sonia Hill continues to have evidence of a small bowel obstruction.  I was asked by Dr. [name] to see her for discussion of the prognosis for metastatic breast cancer and treatment plan.  She has been living with metastatic breast cancer since 2011.  She does not have significant visceral disease though she has known tumor in the pelvis.  She understands the small bowel obstruction may improve with bowel rest, but an attempted surgery will need to be considered if the symptoms do not improve.  I explained that surgical relief of a bowel obstruction may not be possible and she may benefit from a venting gastrostomy.  We discussed home TPN if a gastrostomy was placed.  She does not wish to consider chemotherapy.  She will have further discussions with Dr. [name] when he returns.  Recommendations: 1.  Management of the small bowel obstruction per surgery 2.  Consider placement of a Port-A-Cath, venting gastrostomy, and home TPN if surgical correction of the bowel obstruction is not possible.   Betsy Coder, MD   11/06/2017, 8:20 AM

## 2017-11-06 NOTE — Progress Notes (Signed)
Patient ID: Sonia Hill, female   DOB: 1955-06-30, 62 y.o.   MRN: [id number]     Subjective: She states that she is hungry.  Denies nausea or abdominal pain.  Having gas and some stool per colostomy.  Objective: Vital signs in last 24 hours: Temp:  [97.7 F (36.5 C)-98 F (36.7 C)] 97.7 F (36.5 C) (05/27 0549) Pulse Rate:  [70-80] 70 (05/27 0549) Resp:  [16] 16 (05/27 0549) BP: (89-115)/(62-68) 115/62 (05/27 0549) SpO2:  [100 %] 100 % (05/26 1517) Last BM Date: 11/04/17  Intake/Output from previous day: 05/26 0701 - 05/27 0700 In: 3363.3 [I.V.:3363.3] Out: 825 [Urine:825] Intake/Output this shift: No intake/output data recorded.  General appearance: alert, no distress and Thin GI: Probable mild distention.  Soft and nontender.  No masses palpable.  Lab Results:  Recent Labs    11/05/17 0414 11/06/17 0421  WBC 4.6 4.0  HGB 11.2* 10.9*  HCT 33.4* 31.6*  PLT 329 319   BMET Recent Labs    11/05/17 0414 11/06/17 0421  NA 129* 130*  130*  K 3.9 3.6  3.6  CL 100* 97*  96*  CO2 20* 26  26  GLUCOSE 84 100*  99  BUN 10 8  8   CREATININE 0.92 0.91  0.91  CALCIUM 8.6* 8.2*  8.3*     Studies/Results: Dg Abd 2 Views  Result Date: 11/05/2017 CLINICAL DATA:  Per order- small bowel obstruction PT HX: GERD, bladder sugery EXAM: ABDOMEN - 2 VIEW COMPARISON:  CT 11/03/2017 and previous FINDINGS: Visualized lung bases clear.  No free air. Multiple dilated small bowel loops with fluid levels on the erect radiograph. Similar number of involved loops and degree of dilatation since previous radiograph 11/02/2017. There has been progression of oral contrast material into the proximal colon which is nondilated. There is a left lower quadrant ostomy device. No abnormal abdominal calcifications.  Regional bones unremarkable. IMPRESSION: 1. Findings consistent with persistent partial mid/distal small bowel obstruction. 2. No free air. Electronically Signed   By: Lucrezia Europe M.D.    On: 11/05/2017 12:23    Anti-infectives: Anti-infectives (From admission, onward)   None      Assessment/Plan: Partial small bowel obstruction likely secondary to metastatic breast cancer with known pelvic metastases and history of colonic obstruction.  Plain films show persistent partial high-grade obstruction.  Discussed with the patient and her husband.  I think she likely is going to require surgery in order to be able to eat and maintain nutrition.  She is understandably reluctant to undergo surgery.  She feels she was managing the current situation with her liquid diet at home pretty well.  She would like to try clear liquids to see how she does.  This is not unreasonable and I will order a clear liquid diet.  However I think ultimately she may require surgery.  If so nutritional support with TNA would be necessary.  All this discussed with the patient and her husband and questions answered.    LOS: 3 days    Edward Jolly 11/06/2017

## 2017-11-06 NOTE — Progress Notes (Signed)
[facility] PROGRESS NOTE  Sonia Hill YQI:[id number] DOB: Jul 29, 1955 DOA: 11/03/2017 PCP: Sonia Elbe, MD   Narrative:  6 female-metastatic breast cancer (magrinat)-on Verzenio Colostomy + rectal mass 04/2017-complicated by SBO-recurrent SBO 06/2017 and currently on liquid diet at home  Admit severe excruciating abdominal pain no ostomy output CT = worsening cancer-blood pressures stable Main lab abnormalities potassium 2.5 calcium 5.6 glucose 55 treated initially No NG tube-General surgery and oncology have consulted   A & Plan  SBO-defer to general surgery-appreciate input-no NG tube at this time-tolerating to some extent clear liquids and passing fluids into bag and changed back today Getting portable x-ray a.m. ?  Ex lap?  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 [name] for further planning-TPN?  Solitary kidney hydronephrosis-unlikely to be nephrotic syndrome I do not think patient needs to keep nuclear medicine scan of kidney-hypervolemia hyponatremia points to poor oncotic pressure albumin is 2 and I think that is a source of her edema Outpatient nephrology can see her  Anxiety-giving Ativan very low-dose at bedtime as needed  (Cancer history available in last office note 09/25/2017 as per Dr. [name])   DVT prophylaxis: Lovenox  code Status:  Full CODE STATUS family Communication: discussed with the patient's Husband and sister at  the bedside Disposition Plan: Inpatient   [name], MD  [facility] Direct contact: [phone] --[address]ia [place]  --[email or web address]; password [email or web address]  7PM-7AM contact night coverage as above 11/06/2017, 1:29 PM  LOS: 3 days   Consultants:  General surgery  Oncology  Procedures:  None yet  Antimicrobials:  None  Interval history/Subjective:  Drinking and tolerating the same-passing some flatus and emptied bag-stomach distended and feels swollen  Walking in hallway with husband  Objective:  Vitals:  Vitals:   11/05/17 1857 11/06/17 0549  BP: 112/68 115/62  Pulse:  70  Resp:  16  Temp:  97.7 F (36.5 C)  SpO2:      Exam:  Awake alert oriented no distress frail with by temporalis wasting as well as supraclavicular wasting EOMI NCAT S1-S2 no murmur rub or gallop Abdomen is distended and with flank dullness bilaterally and slight distention Neuro is intact she is coherent Left lower extremity swollen 2-3 stockings on  I have personally reviewed the following:   Labs:  Sodium 126--->129--->130 CO2 20 chloride 99  Cbc 14-->11   Urine sodium is 100 urine creatinine is 155 and specific gravity is 1.016 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 enoxaparin (LOVENOX) injection  40 mg Subcutaneous QHS  . levothyroxine  50 mcg Intravenous Daily   Continuous Infusions: . dextrose 10 % 1,000 mL with potassium chloride 20 mEq, sodium bicarbonate 150 mEq infusion 50 mL/hr at 11/06/17 0902  . dextrose 5 % and 0.9% NaCl 75 mL/hr  at 11/05/17 0518    Principal Problem:   Small bowel obstruction (HCC) Active Problems:   Breast cancer metastasized to multiple sites [facility])   Hyponatremia   Hydronephrosis   Solitary kidney    Hypoglycemia   Hypokalemia   Hypocalcemia   LOS: 3 days

## 2017-11-07 ENCOUNTER — Inpatient Hospital Stay (HOSPITAL_COMMUNITY): Payer: 59

## 2017-11-07 ENCOUNTER — Ambulatory Visit (HOSPITAL_COMMUNITY): Admission: RE | Admit: 2017-11-07 | Payer: 59 | Source: Ambulatory Visit

## 2017-11-07 ENCOUNTER — Inpatient Hospital Stay: Payer: 59 | Attending: Oncology

## 2017-11-07 LAB — COMPREHENSIVE METABOLIC PANEL
ALBUMIN: 2.8 g/dL — AB (ref 3.5–5.0)
ALK PHOS: 57 U/L (ref 38–126)
ALT: 9 U/L — AB (ref 14–54)
AST: 29 U/L (ref 15–41)
Anion gap: 9 (ref 5–15)
BILIRUBIN TOTAL: 0.6 mg/dL (ref 0.3–1.2)
BUN: 7 mg/dL (ref 6–20)
CO2: 26 mmol/L (ref 22–32)
CREATININE: 1.05 mg/dL — AB (ref 0.44–1.00)
Calcium: 8.9 mg/dL (ref 8.9–10.3)
Chloride: 95 mmol/L — ABNORMAL LOW (ref 101–111)
GFR calc Af Amer: >60 mL/min (ref >60)
GFR calc non Af Amer: 56 mL/min — ABNORMAL LOW (ref >60)
GLUCOSE: 86 mg/dL (ref 65–99)
Potassium: 4 mmol/L (ref 3.5–5.1)
Sodium: 130 mmol/L — ABNORMAL LOW (ref 135–145)
Total Protein: 5.2 g/dL — ABNORMAL LOW (ref 6.5–8.1)

## 2017-11-07 LAB — CBC WITH DIFFERENTIAL/PLATELET
BASOS ABS: 0 10*3/uL (ref 0.0–0.1)
BASOS PCT: 0 %
Eosinophils Absolute: 0.1 10*3/uL (ref 0.0–0.7)
Eosinophils Relative: 1 %
HEMATOCRIT: 35.5 % — AB (ref 36.0–46.0)
HEMOGLOBIN: 12 g/dL (ref 12.0–15.0)
LYMPHS PCT: 15 %
Lymphs Abs: 0.9 10*3/uL (ref 0.7–4.0)
MCH: 28.9 pg (ref 26.0–34.0)
MCHC: 33.8 g/dL (ref 30.0–36.0)
MCV: 85.5 fL (ref 78.0–100.0)
MONO ABS: 0.2 10*3/uL (ref 0.1–1.0)
Monocytes Relative: 3 %
NEUTROS ABS: 5 10*3/uL (ref 1.7–7.7)
NEUTROS PCT: 81 %
Platelets: 328 10*3/uL (ref 150–400)
RBC: 4.15 MIL/uL (ref 3.87–5.11)
RDW: 14.1 % (ref 11.5–15.5)
WBC: 6.2 10*3/uL (ref 4.0–10.5)

## 2017-11-07 LAB — RENAL FUNCTION PANEL
Albumin: 2.8 g/dL — ABNORMAL LOW (ref 3.5–5.0)
Anion gap: 10 (ref 5–15)
BUN: 7 mg/dL (ref 6–20)
CALCIUM: 8.9 mg/dL (ref 8.9–10.3)
CHLORIDE: 94 mmol/L — AB (ref 101–111)
CO2: 26 mmol/L (ref 22–32)
CREATININE: 0.97 mg/dL (ref 0.44–1.00)
GFR calc Af Amer: >60 mL/min (ref >60)
GFR calc non Af Amer: >60 mL/min (ref >60)
GLUCOSE: 85 mg/dL (ref 65–99)
Phosphorus: 2.7 mg/dL (ref 2.5–4.6)
Potassium: 4 mmol/L (ref 3.5–5.1)
Sodium: 130 mmol/L — ABNORMAL LOW (ref 135–145)

## 2017-11-07 LAB — GLUCOSE, CAPILLARY
GLUCOSE-CAPILLARY: 73 mg/dL (ref 65–99)
GLUCOSE-CAPILLARY: 78 mg/dL (ref 65–99)
GLUCOSE-CAPILLARY: 83 mg/dL (ref 65–99)
GLUCOSE-CAPILLARY: 83 mg/dL (ref 65–99)
Glucose-Capillary: 85 mg/dL (ref 65–99)
Glucose-Capillary: 93 mg/dL (ref 65–99)

## 2017-11-07 LAB — MAGNESIUM: Magnesium: 1.7 mg/dL (ref 1.7–2.4)

## 2017-11-07 LAB — MRSA PCR SCREENING: MRSA by PCR: NEGATIVE

## 2017-11-07 MED ORDER — TRACE MINERALS CR-CU-MN-SE-ZN 10-1000-500-60 MCG/ML IV SOLN
INTRAVENOUS | Status: AC
  Administered 2017-11-07: 18:00:00 via INTRAVENOUS
  Filled 2017-11-07: qty 720

## 2017-11-07 MED ORDER — BUPIVACAINE LIPOSOME 1.3 % IJ SUSP
20.0000 mL | Freq: Once | INTRAMUSCULAR | Status: DC
  Filled 2017-11-07: qty 20

## 2017-11-07 MED ORDER — GABAPENTIN 300 MG PO CAPS: 300.0000 mg | ORAL_CAPSULE | ORAL | Status: DC

## 2017-11-07 MED ORDER — CHLORHEXIDINE GLUCONATE CLOTH 2 % EX PADS
6.0000 | MEDICATED_PAD | Freq: Once | CUTANEOUS | Status: AC
  Administered 2017-11-08: 6 via TOPICAL

## 2017-11-07 MED ORDER — MAGNESIUM SULFATE 2 GM/50ML IV SOLN
2.0000 g | Freq: Once | INTRAVENOUS | Status: AC
  Administered 2017-11-07: 2 g via INTRAVENOUS
  Filled 2017-11-07: qty 50

## 2017-11-07 MED ORDER — CHLORHEXIDINE GLUCONATE CLOTH 2 % EX PADS: 6.0000 | MEDICATED_PAD | Freq: Once | CUTANEOUS | Status: DC

## 2017-11-07 MED ORDER — CEFOTETAN DISODIUM-DEXTROSE 2-2.08 GM-%(50ML) IV SOLR
2.0000 g | INTRAVENOUS | Status: AC
  Administered 2017-11-08: 2 g via INTRAVENOUS

## 2017-11-07 MED ORDER — ACETAMINOPHEN 500 MG PO TABS
1000.0000 mg | ORAL_TABLET | ORAL | Status: AC
  Administered 2017-11-08: 1000 mg via ORAL
  Filled 2017-11-07: qty 2

## 2017-11-07 MED ORDER — ACETAMINOPHEN 500 MG PO TABS: 1000.0000 mg | ORAL_TABLET | ORAL | Status: DC

## 2017-11-07 MED ORDER — BUPIVACAINE LIPOSOME 1.3 % IJ SUSP
20.0000 mL | Freq: Once | INTRAMUSCULAR | Status: DC
  Filled 2017-11-07 (×2): qty 20

## 2017-11-07 MED ORDER — FAT EMULSION PLANT BASED 20 % IV EMUL
250.0000 mL | INTRAVENOUS | Status: AC
  Administered 2017-11-07: 250 mL via INTRAVENOUS
  Filled 2017-11-07: qty 250

## 2017-11-07 MED ORDER — GABAPENTIN 300 MG PO CAPS
300.0000 mg | ORAL_CAPSULE | ORAL | Status: AC
  Administered 2017-11-08: 300 mg via ORAL
  Filled 2017-11-07: qty 1

## 2017-11-07 MED ORDER — CEFOTETAN DISODIUM-DEXTROSE 2-2.08 GM-%(50ML) IV SOLR: 2.0000 g | INTRAVENOUS | Status: DC

## 2017-11-07 MED ORDER — SODIUM CHLORIDE 0.9 % IV SOLN: 2.0000 g | INTRAVENOUS | Status: DC

## 2017-11-07 MED ORDER — FUROSEMIDE 10 MG/ML IJ SOLN
20.0000 mg | Freq: Once | INTRAMUSCULAR | Status: AC
  Administered 2017-11-07: 20 mg via INTRAVENOUS
  Filled 2017-11-07: qty 2

## 2017-11-07 NOTE — Anesthesia Preprocedure Evaluation (Addendum)
Anesthesia Evaluation  Patient identified by MRN, date of birth, ID band Patient awake    Reviewed: Allergy & Precautions, NPO status , Patient's Chart, lab work & pertinent test results  History of Anesthesia Complications (+) PONV  Airway Mallampati: II  TM Distance: >3 FB Neck ROM: Full    Dental no notable dental hx. (+) Teeth Intact, Dental Advisory Given   Pulmonary neg pulmonary ROS,    Pulmonary exam normal breath sounds clear to auscultation       Cardiovascular negative cardio ROS Normal cardiovascular exam Rhythm:Regular Rate:Normal     Neuro/Psych negative neurological ROS     GI/Hepatic Neg liver ROS, GERD  ,  Endo/Other  Hypothyroidism   Renal/GU Renal disease  negative genitourinary   Musculoskeletal   Abdominal   Peds negative pediatric ROS (+)  Hematology negative hematology ROS (+)   Anesthesia Other Findings All: Sulfa  Reproductive/Obstetrics negative OB ROS                             Lab Results  Component Value Date   WBC 6.2 11/07/2017   HGB 12.0 11/07/2017   HCT 35.5 (L) 11/07/2017   MCV 85.5 11/07/2017   PLT 328 11/07/2017    Lab Results  Component Value Date   CREATININE 1.05 (H) 11/07/2017   CREATININE 0.97 11/07/2017   BUN 7 11/07/2017   BUN 7 11/07/2017   NA 130 (L) 11/07/2017   NA 130 (L) 11/07/2017   K 4.0 11/07/2017   K 4.0 11/07/2017   CL 95 (L) 11/07/2017   CL 94 (L) 11/07/2017   CO2 26 11/07/2017   CO2 26 11/07/2017    Anesthesia Physical Anesthesia Plan  ASA: II  Anesthesia Plan: General   Post-op Pain Management:    Induction:   PONV Risk Score and Plan: 3 and Treatment may vary due to age or medical condition, Dexamethasone, Ondansetron and Scopolamine patch - Pre-op  Airway Management Planned: Oral ETT  Additional Equipment:   Intra-op Plan:   Post-operative Plan: Extubation in OR  Informed Consent: I have  reviewed the patients History and Physical, chart, labs and discussed the procedure including the risks, benefits and alternatives for the proposed anesthesia with the patient or authorized representative who has indicated his/her understanding and acceptance.   Dental advisory given  Plan Discussed with: CRNA  Anesthesia Plan Comments:         Anesthesia Quick Evaluation

## 2017-11-07 NOTE — Progress Notes (Signed)
PHARMACY - ADULT TOTAL PARENTERAL NUTRITION CONSULT NOTE   Pharmacy Consult for TPN Indication: Intolerance to enteral feeding  Patient Measurements: Height: 5' (152.4 cm) Weight: 108 lb 11 oz (49.3 kg) IBW/kg (Calculated) : 45.5 TPN AdjBW (KG): 49.3 Body mass index is 21.23 kg/m.  Assessment: Patient has metastatic breast cancer and partial small bowel obstruction on full liquids for 4 months, now intolerance to clear liquids.   GI:  Endo:  Insulin requirements in the past 24 hours: n/a Lytes: K = 4, Mg = 1.7, Phos = 2.7 are WNL. Renal: SCr = 1, stable Pulm: Cards:  Hepatobil: LFT WNL Neuro: ID: N/a  TPN Access: Double lumen PICC placed 5/25 per RN TPN start date: 11-07-17 Nutritional Goals (per RD recommendation on __): Awaiting information, TBD  I calculated estimated nutritional needs to be: kCal: 1232 - 1725 kcal Protein: 59 - 98 g  Goal TPN rate Clinimix 5/15 E is 60 ml/hr (provides 1502 kcal, 72 g protein would meet 100% of needs)  Current Nutrition: clear liquid  Plan:  Clinimix 5/15 with E TPN at 30 mL/hr initial rate Increase to goal rate if electrolytes remain stable and patient able to tolerate 20% lipids @ 20 mL/hr x 12 hours Give magnesium 2 g IV once Add MVI, trace elements Order SSI q6h No maintenance fluids currently ordered Monitor TPN labs F/U electrolytes, patient at risk for refeeding syndrome  Sonia Hill, PharmD, BCPS Clinical Pharmacist 11/07/2017,1:36 PM

## 2017-11-07 NOTE — Progress Notes (Signed)
[facility] PROGRESS NOTE  Sonia Hill DVV:[id number] DOB: 1955/12/04 DOA: 11/03/2017 PCP: [name], MD   Narrative:  86 female-metastatic breast cancer (magrinat)-on Verzenio Colostomy + rectal mass 04/2017-complicated by SBO-recurrent SBO 06/2017 and currently on liquid diet at home  Admit severe excruciating abdominal pain no ostomy output CT = worsening cancer-blood pressures stable Main lab abnormalities potassium 2.5 calcium 5.6 glucose 55 treated initially No NG tube-General surgery and oncology have consulted on the patient It appears that patient may be going for exploratory surgery 5/28 and is n.p.o. after   A & Plan  SBO-defer to general surgery-appreciate input-no NG tube at this time-tolerating to some extent clear liquids and passing fluids into bag and changed back today X-rays of abdomen pelvis show persisting and increased high-grade small bowel obstruction This is extensively discussed with the patient and  Possible hypervolemic ?natremia +/-SIADH Hypoglycemia--sugars have resolved 90-110 range  Getting TPN-stopped IVF givng lasix iv 20 x 1  For aquaresis [only on prn basis-reorder if u feel she needs in am]   Hypokalemia-magnesium 0.7,recieved replacement Now TPN with micronutrient  Hypothyroidism-if not resolving replace in 24 hours with the same- as IV  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 [name] --Dr. Malachy Mood is covering for him until Thursday  Solitary kidney hydronephrosis-unlikely to be nephrotic syndrome--see progress note for further details I do not think patient needs to keep nuclear medicine scan of kidney-hypervolemia hyponatremia points to poor oncotic pressure albumin is 2 and I think that is a source of her edema Outpatient nephrology can see her  Anxiety-giving Ativan very low-dose at bedtime as  needed  (Cancer history available in last office note 09/25/2017 as per Dr. [name])   DVT prophylaxis: Lovenox  code Status:  Full CODE STATUS family Communication: discussed with the patient's Husband --- discussed in detail with Dr. gross the plan and if the surgery is non-complicated-Dr. gross agrees to take over as primary service in terms of general surgery managing acute postop issues-if however there is significant peritoneal carcinomatosis then a venting gastrostomy will be placed and patient may need outpatient follow-up as guided by oncology and surgery-it is unclear at this time if CODE STATUS will change depending on these discussions-the patient shares with me that she wishes to go to [place] in August and go snorkeling with what ever parenteral devices that are required as she wants to cross this off of her "bucket list" indeed this may be possible--will defer to the collective judgment of general surgery and oncology with regards to the same Disposition Plan: Inpatient not ready for discharge at this time expect several days versus weeks   [name], MD  [facility] Direct contact: [phone] --Via amion app OR  --[email or web address]; password [email or web address]  7PM-7AM contact night coverage as above 11/07/2017, 4:50 PM  LOS: 4 days   Consultants:  General surgery  Oncology  Procedures:  None yet  Antimicrobials:  None  Interval history/Subjective:  Walking in hallway with husband--feels less bloated asking for Lasix-shares with me she had hydronephrosis baby had surgery at age 48 and then had hydronephrosis on the opposite side and had reimplantation of her ureter in her 35s hence her concern regarding her kidneys  Objective:  Vitals:  Vitals:   11/07/17 1441 11/07/17 1519  BP: 112/70   Pulse: 86   Resp: 17   Temp: 98.3 F (36.8 C)   SpO2: 100% 98%  Exam:  Awake alert oriented no distress frail with by temporalis wasting as well as supraclavicular  wasting EOMI NCAT S1-S2 no murmur rub or gallop Abdomen is distended and with flank dullness bilaterally and slight distention Neuro is intact she is coherent Left lower extremity swollen 2-3 stockings on  I have personally reviewed the following:   Labs:  Sodium 126--->129--->130 CO2 26  Cbc 14-->11--->12  Magnesium this morning 1.7   Imaging studies:  Reviewed  Medical tests:  Reviewed  Test discussed with performing physician:  No  Decision to obtain old records:  Yes  Review and summation of old records:  Extensively summarized  Scheduled Meds: . [START ON 11/08/2017] acetaminophen  1,000 mg Oral On Call to OR  . [START ON 11/08/2017] bupivacaine liposome  20 mL Infiltration Once  . [START ON 11/08/2017] cefoTEtan (CEFOTAN) IV  2 g Intravenous On Call to OR  . [START ON 11/08/2017] Chlorhexidine Gluconate Cloth  6 each Topical Once  . enoxaparin (LOVENOX) injection  40 mg Subcutaneous QHS  . [START ON 11/08/2017] gabapentin  300 mg Oral On Call to OR  . levothyroxine  50 mcg Intravenous Daily   Continuous Infusions: . Marland KitchenTPN (CLINIMIX-E) Adult    . Fat emulsion      Principal Problem:   Small bowel obstruction (HCC) Active Problems:   Breast cancer metastasized to multiple sites ([place])   Hyponatremia   Hydronephrosis   Solitary kidney   Hypoglycemia   Hypokalemia   Hypocalcemia   LOS: 4 days

## 2017-11-07 NOTE — Progress Notes (Signed)
[facility] Progress Note     Subjective: CC:  C/o abdominal distention and edema. Nausea s/p clears but no emesis. Mobilizing independently. Minimal gas and stool in colostomy pouch. Endorses urinary hesitancy and retention that is baseline for her.   Objective: Vital signs in last 24 hours: Temp:  [97.9 F (36.6 C)-98.8 F (37.1 C)] 98.5 F (36.9 C) (05/28 0437) Pulse Rate:  [85-97] 85 (05/28 0437) Resp:  [12-18] 18 (05/28 0437) BP: (90-110)/(56-77) 90/77 (05/28 0437) SpO2:  [97 %-99 %] 98 % (05/28 0437) Last BM Date: 11/06/17  Intake/Output from previous day: 05/27 0701 - 05/28 0700 In: 2602.5 [P.O.:2040; I.V.:562.5] Out: 500 [Urine:500] Intake/Output this shift: No intake/output data recorded.  PE: Gen:  Alert, NAD, pleasant and cooperative  Card:  Regular rate and rhythm, pedal pulses 2+ BL Pulm:  Normal effort, clear to auscultation bilaterally Abd: distended, non-tender, LLQ colostomy with ~ 5 cc liquid stool, hyperactive bowel sounds  Skin: warm and dry, no rashes   Psych: A&Ox3   Lab Results:  Recent Labs    11/06/17 0421 11/07/17 0417  WBC 4.0 6.2  HGB 10.9* 12.0  HCT 31.6* 35.5*  PLT 319 328   BMET Recent Labs    11/06/17 0421 11/07/17 0417  NA 130*  130* 130*  130*  K 3.6  3.6 4.0  4.0  CL 97*  96* 95*  94*  CO2 26  26 26  26   GLUCOSE 100*  99 86  85  BUN 8  8 7  7   CREATININE 0.91  0.91 1.05*  0.97  CALCIUM 8.2*  8.3* 8.9  8.9   PT/INR No results for input(s): LABPROT, INR in the last 72 hours. CMP     Component Value Date/Time   NA 130 (L) 11/07/2017 0417   NA 130 (L) 11/07/2017 0417   NA 134 (L) 12/28/2016 1503   K 4.0 11/07/2017 0417   K 4.0 11/07/2017 0417   K 4.0 12/28/2016 1503   CL 95 (L) 11/07/2017 0417   CL 94 (L) 11/07/2017 0417   CL 98 12/03/2012 1419   CO2 26 11/07/2017 0417   CO2 26 11/07/2017 0417   CO2 25 12/28/2016 1503   GLUCOSE 86 11/07/2017 0417   GLUCOSE 85 11/07/2017 0417   GLUCOSE 96 12/28/2016 1503   GLUCOSE 89 12/03/2012 1419   BUN 7 11/07/2017 0417   BUN 7 11/07/2017 0417   BUN 12.0 12/28/2016 1503   CREATININE 1.05 (H) 11/07/2017 0417   CREATININE 0.97 11/07/2017 0417   CREATININE 0.81 09/13/2017 1157   CREATININE 0.8 12/28/2016 1503   CALCIUM 8.9 11/07/2017 0417   CALCIUM 8.9 11/07/2017 0417   CALCIUM 9.7 12/28/2016 1503   PROT 5.2 (L) 11/07/2017 0417   PROT 6.5 12/28/2016 1503   ALBUMIN 2.8 (L) 11/07/2017 0417   ALBUMIN 2.8 (L) 11/07/2017 0417   ALBUMIN 3.9 12/28/2016 1503   AST 29 11/07/2017 0417   AST 25 09/13/2017 1157   AST 22 12/28/2016 1503   ALT 9 (L) 11/07/2017 0417   ALT 12 09/13/2017 1157   ALT 20 12/28/2016 1503   ALKPHOS 57 11/07/2017 0417   ALKPHOS 76 12/28/2016 1503   BILITOT 0.6 11/07/2017 0417   BILITOT 0.6 09/13/2017 1157   BILITOT 0.50 12/28/2016 1503   GFRNONAA 56 (L) 11/07/2017 0417   GFRNONAA >60 11/07/2017 0417   GFRNONAA >60 09/13/2017 1157   GFRAA >60 11/07/2017 0417   GFRAA >60 11/07/2017 0417   GFRAA >60 09/13/2017  1157   Lipase     Component Value Date/Time   LIPASE 37 11/03/2017 1828       Studies/Results: Dg Abd 2 Views  Result Date: 11/07/2017 CLINICAL DATA:  62 year old female with small bowel obstruction. Recent colonic surgery. Subsequent encounter. EXAM: ABDOMEN - 2 VIEW COMPARISON:  11/05/2017 plain film examination.  11/03/2017 CT. FINDINGS: Gas and fluid distended dilated small bowel loops more prominent than on the recent plain film examination with small bowel measuring up to 4.7 cm versus prior 4.5 cm. Small bowel fold thickening. Air-fluid levels. Interval development of gas filled stomach. Minimal residual contrast within colon. Colostomy left lower quadrant. No free intraperitoneal air noted. IMPRESSION: Progressive partial small bowel obstructive pattern. Interval development of gas-filled stomach. No free intraperitoneal air. These results will be called to the ordering clinician or  representative by the Radiologist Assistant, and communication documented in the PACS or zVision Dashboard. Electronically Signed   By: Genia Del M.D.   On: 11/07/2017 09:05   Dg Abd 2 Views  Result Date: 11/05/2017 CLINICAL DATA:  Per order- small bowel obstruction PT HX: GERD, bladder sugery EXAM: ABDOMEN - 2 VIEW COMPARISON:  CT 11/03/2017 and previous FINDINGS: Visualized lung bases clear.  No free air. Multiple dilated small bowel loops with fluid levels on the erect radiograph. Similar number of involved loops and degree of dilatation since previous radiograph 11/02/2017. There has been progression of oral contrast material into the proximal colon which is nondilated. There is a left lower quadrant ostomy device. No abnormal abdominal calcifications.  Regional bones unremarkable. IMPRESSION: 1. Findings consistent with persistent partial mid/distal small bowel obstruction. 2. No free air. Electronically Signed   By: Lucrezia Europe M.D.   On: 11/05/2017 12:23    Anti-infectives: Anti-infectives (From admission, onward)   None     Assessment/Plan metastatic lobular carcinoma of the breast s/p left mastectomy and sentinel node biopsy in 2011; metastasis to pelvis last year s/p laparoscopic diverting loop sigmoid colostomy 2018 in [place]. oncologist: Dr. [name]  pSBO suspect 2/2 metastatic breast CA w/ known pelvic metastasis  - persistent distention, nausea, and minimal colostomy output on CLD  - worsening pSBO pattern on AXR  - palliative care consult to discuss goals of care and quality of life after an operation. I have discussed this with the patient and she is agreeable and would like to have this conversation.  - tentative plans for diagnostic laparoscopy tomorrow; possible laparotomy with bowel resection, possible gastrostomy tube.  - NPO, limited sips/ice ok - start TNA  - mobilize    LOS: 4 days    Jill Alexanders , [facility] 11/07/2017, 9:43 AM Pager:  [phone] Consults: [phone] Mon-Fri 7:00 am-4:30 pm Sat-Sun 7:00 am-11:30 am

## 2017-11-07 NOTE — Consult Note (Signed)
Consultation Note Date: 11/07/2017   Patient Name: Sonia Hill  DOB: [birth year]  MRN: [id number]  Age / Sex: 62 y.o., female  PCP: [name], MD Referring Physician: Nita Sells, MD  Reason for Consultation: Establishing goals of care  HPI/Patient Profile: 62 y.o. 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 Patient has metastatic disease in pelvis status post laparoscopic diverting loop sigmoid colostomy in 2018. More recently the patient has been having a difficulty eating and abdominal distention. Patient has been admitted this time with partial small bowel obstruction deemed likely secondary to carcinomatosis. She came in complaining of abdominal distention edema and nausea, she was not able to have satisfactory oral intake.  Patient has been seen and evaluated by surgery colleagues. Plan is for the patient undergo diagnostic laparoscopy on 5-20 9-19, possible laparotomy with bowel resection questionably needed for G-tube placement is also being considered. Her primary oncologist is Dr. [name].   A palliative medicine consultation has been requested for supportive care, assistance with decision making, extra layer of support to follow disease trajectory and hospital course.  Patient is awake alert resting in bed. She has just returned from walking in the hallways with her husband. She states that she has tried to keep herself active, she has tried to keep up her nutrition for a long time. She is concerned about her upcoming surgery. She is concerned about the possibility that she could have provide extensive metastatic burden with peritoneal carcinomatosis. We discussed about  goals wishes and values. Gave them information about outpatient palliative care services available. Briefly also discussed hospice philosophy of care. Provided active listening and supportive care. Endorsed the patient's wishes to remain hopeful for some degree of stabilization in her overall condition.  NEXT OF KIN  husband Has a son who lives out of town.    SUMMARY OF RECOMMENDATIONS   Full code/full scope for now.   Patient in full understanding of and willing to proceed with diagnostic laparoscopy on 11-08-17. She is awaiting the arrival of her sister and some other family and friends who will arrive tonight.   PMT to continue to follow for ongoing discussions regarding overall goals of care.   Thank you for the consult.   Code Status/Advance Care Planning:  Full code    Symptom Management:    as above, denies any complaints currently.   Palliative Prophylaxis:    delirium management.    Psycho-social/Spiritual:   Desire for further Chaplaincy support:yes  Additional Recommendations: Caregiving  Support/Resources  Prognosis:   Unable to determine  Discharge Planning: To Be Determined      Primary Diagnoses: Present on Admission: . Small bowel obstruction ([place]) . Hypoglycemia . Hyponatremia . Hydronephrosis . Breast cancer metastasized to multiple sites [facility]) . Hypokalemia . Hypocalcemia   I have reviewed the medical  record, interviewed the patient and family, and examined the patient. The following aspects are pertinent.  Past Medical History:  Diagnosis Date  . Arthritis    neck, upper back, shoulders - no meds - yoga  . Chronic kidney disease    Only has right kidney  . Depression    Hx - no current problem  . GERD (gastroesophageal reflux disease)    diet controlled - no meds  . Hyperlipidemia    diet controlled  . Hypothyroidism   . lt breast ca dx'd 11/2009  . Osteoporosis, unspecified 12/06/2013  . PONV (postoperative nausea and  vomiting)   . SVD (spontaneous vaginal delivery)    x 1   Social History   Socioeconomic History  . Marital status: Married    Spouse name: Not on file  . Number of children: Not on file  . Years of education: Not on file  . Highest education level: Not on file  Occupational History  . Not on file  Social Needs  . Financial resource strain: Not on file  . Food insecurity:    Worry: Not on file    Inability: Not on file  . Transportation needs:    Medical: Not on file    Non-medical: Not on file  Tobacco Use  . Smoking status: Never Smoker  . Smokeless tobacco: Never Used  Substance and Sexual Activity  . Alcohol use: No  . Drug use: No  . Sexual activity: Yes    Birth control/protection: Post-menopausal  Lifestyle  . Physical activity:    Days per week: Not on file    Minutes per session: Not on file  . Stress: Not on file  Relationships  . Social connections:    Talks on phone: Not on file    Gets together: Not on file    Attends religious service: Not on file    Active member of club or organization: Not on file    Attends meetings of clubs or organizations: Not on file    Relationship status: Not on file  Other Topics Concern  . Not on file  Social History Narrative  . Not on file   Family History  Problem Relation Age of Onset  . Breast cancer Mother 48  . Breast cancer Maternal Aunt 58  . Breast cancer Other        MGF's sister dx with breast cancer in her 28s   Scheduled Meds: . enoxaparin (LOVENOX) injection  40 mg Subcutaneous QHS  . levothyroxine  50 mcg Intravenous Daily   Continuous Infusions: PRN Meds:.albuterol, hydrALAZINE, LORazepam, morphine injection, ondansetron **OR** ondansetron (ZOFRAN) IV, sodium chloride flush Medications Prior to Admission:  Prior to Admission medications   Medication Sig Start Date End Date Taking? Authorizing Provider  abemaciclib (VERZENIO) 150 MG tablet Take 1 tablet (150 mg total) by mouth 2 (two) times  daily. Swallow tablets whole. Do not chew, crush, or split tablets before swallowing. 10/18/17  Yes Magrinat, Virgie Dad, MD  ALPRAZolam Duanne Moron) 0.25 MG tablet TAKE 1 TABLET BY MOUTH EVERY 8 HOURS AS NEEDED FOR ANXIETY OR SLEEP 08/28/17  Yes Magrinat, Virgie Dad, MD  cholecalciferol (D-VI-SOL) 400 UNIT/ML LIQD Take 400 Units by mouth daily.   Yes [provider]  exemestane (AROMASIN) 25 MG tablet Take 1 tablet (25 mg total) by mouth daily after breakfast. 07/05/17  Yes Magrinat, Virgie Dad, MD  gabapentin (NEURONTIN) 300 MG capsule Take 1 capsule (300 mg total) by mouth 3 (three) times daily as  needed. Patient taking differently: Take 300 mg by mouth 3 (three) times daily as needed (nerve pain).  06/28/17  Yes Magrinat, Virgie Dad, MD  ondansetron (ZOFRAN-ODT) 8 MG disintegrating tablet Take 1 tablet (8 mg total) by mouth every 8 (eight) hours as needed for nausea or vomiting. 06/22/17  Yes Magrinat, Virgie Dad, MD  polyethylene glycol (MIRALAX / GLYCOLAX) packet Take 17 g by mouth daily.   Yes [provider]  simethicone (MYLICON) 371 MG chewable tablet Chew 125 mg by mouth every 2 (two) hours as needed for flatulence.    Yes [provider]  Thyroid (NATURE-THROID) 81.25 MG TABS Take 81.25 mg by mouth daily.   Yes [provider]  promethazine (PHENERGAN) 25 MG suppository Place 1 suppository (25 mg total) rectally every 6 (six) hours as needed for nausea or vomiting. Patient not taking: Reported on 09/13/2017 06/22/17   Magrinat, Virgie Dad, MD   Allergies  Allergen Reactions  . Sulfa Antibiotics Rash  . Adhesive [Tape] Rash    Ok to use paper tape and tegaderm over IV site  . Betadine [Povidone Iodine] Rash  . Mercury Rash    Reaction mercurachrome   Review of Systems +nausea at times Poor appetite Abdominal heaviness/fullness  Physical Exam Awake alert Frail lady with muscle wasting evident S1 S2 Clear Abdomen distended Non focal Has some edema  Vital Signs:  BP 90/77 (BP Location: Right Leg)   Pulse 85   Temp 98.5 F (36.9 C) (Oral)   Resp 18   Ht 5' (1.524 m)   Wt 49.3 kg (108 lb 11 oz)   SpO2 98%   BMI 21.23 kg/m  Pain Scale: 0-10   Pain Score: 0-No pain   SpO2: SpO2: 98 % O2 Device:SpO2: 98 % O2 Flow Rate: .   IO: Intake/output summary:   Intake/Output Summary (Last 24 hours) at 11/07/2017 1226 Last data filed at 11/07/2017 0900 Gross per 24 hour  Intake 1882.5 ml  Output 250 ml  Net 1632.5 ml    LBM: Last BM Date: 11/06/17 Baseline Weight: Weight: 49.3 kg (108 lb 11 oz) Most recent weight: Weight: 49.3 kg (108 lb 11 oz)     Palliative Assessment/Data:    PPS 40% Time In:  12 Time Out:  1300 Time Total:  60 min  Greater than 50%  of this time was spent counseling and coordinating care related to the above assessment and plan.  Signed by: Loistine Chance, MD  [phone]  Please contact Palliative Medicine Team phone at [phone] for questions and concerns.  For individual provider: See Shea Evans

## 2017-11-07 NOTE — Progress Notes (Signed)
Sonia Hill is a 62 year old woman with metastatic invasive lobular breast cancer s/p mastectomy.  She has metastatic disease in the pelvis and has undergone laparoscopic diverting loop sigmoid colostomy back in 2018.  She has been on many cancer treatments, most recently Verzenio (for 3 days) and Exemestane, which are currently held.  S: Sonia Hill is doing well today. She tells me that she is scheduled for surgery tomorrow with Dr. Johney Maine, and will potentially undergo small bowel resection, or undergo palliative resection if possible and gastrostomy tube for decompression along with TPN.  She is eating ice chips in bed and looking forward to her upcoming surgery and having some direction.  O:  GENERAL: Patient is a well appearing female in no acute distress HEENT:  Sclerae anicteric.  Oropharynx clear and moist. No ulcerations or evidence of oropharyngeal candidiasis.  LUNGS:  Clear to auscultation anteriorly.  No wheezes or rhonchi. HEART:  Regular rate and rhythm. No murmur appreciated. ABDOMEN:  + distention.  Positive, normoactive bowel sounds.  EXTREMITIES:  No peripheral edema.   SKIN:  Clear with no obvious rashes or skin changes. No nail dyscrasia. NEURO:  Nonfocal. Well oriented.  Appropriate affect.  A/P:  Metastatic invasive lobular breast cancer: holding treatment for now.  Will know more about potential future treatments once she undergoes surgery tomorrow.  Dr. Lindi Adie will see her tomorrow.  Agree with palliative care referral.  She also had some kidney scans that will need to be rescheduled once she is discharged.    Bowel Obstruction: surgery tomorrow with Dr. Johney Maine.  Other medical problems: per primary hospitalist team.   I let Sonia Hill know to please call us at the cancer center if there is anything we can help her with.  She knows we will check in on her periodically until Dr. [name] returns on Friday.    Gardenia Phlegm, [place] [phone]

## 2017-11-08 ENCOUNTER — Inpatient Hospital Stay (HOSPITAL_COMMUNITY): Payer: 59 | Admitting: Anesthesiology

## 2017-11-08 ENCOUNTER — Encounter (HOSPITAL_COMMUNITY)
Admission: EM | Disposition: A | Discharge status: Home Health | Payer: Self-pay | Source: Home / Self Care | Attending: Internal Medicine

## 2017-11-08 ENCOUNTER — Encounter: Payer: 59 | Admitting: Physical Therapy

## 2017-11-08 ENCOUNTER — Encounter (HOSPITAL_COMMUNITY): Payer: Self-pay | Admitting: *Deleted

## 2017-11-08 DIAGNOSIS — Z933 Colostomy status: Secondary | ICD-10-CM

## 2017-11-08 DIAGNOSIS — K56609 Unspecified intestinal obstruction, unspecified as to partial versus complete obstruction: Secondary | ICD-10-CM

## 2017-11-08 DIAGNOSIS — E43 Unspecified severe protein-calorie malnutrition: Secondary | ICD-10-CM

## 2017-11-08 DIAGNOSIS — R188 Other ascites: Secondary | ICD-10-CM

## 2017-11-08 HISTORY — PX: LYSIS OF ADHESION: SHX5961

## 2017-11-08 HISTORY — PX: BOWEL RESECTION: SHX1257

## 2017-11-08 HISTORY — PX: GASTROSTOMY: SHX5249

## 2017-11-08 HISTORY — PX: PARTIAL COLECTOMY: SHX5273

## 2017-11-08 HISTORY — PX: LAPAROSCOPY: SHX197

## 2017-11-08 LAB — COMPREHENSIVE METABOLIC PANEL
ALK PHOS: 64 U/L (ref 38–126)
ALT: 13 U/L — AB (ref 14–54)
ANION GAP: 8 (ref 5–15)
AST: 31 U/L (ref 15–41)
Albumin: 2.9 g/dL — ABNORMAL LOW (ref 3.5–5.0)
BUN: 11 mg/dL (ref 6–20)
CO2: 26 mmol/L (ref 22–32)
CREATININE: 1.02 mg/dL — AB (ref 0.44–1.00)
Calcium: 8.8 mg/dL — ABNORMAL LOW (ref 8.9–10.3)
Chloride: 93 mmol/L — ABNORMAL LOW (ref 101–111)
GFR, EST NON AFRICAN AMERICAN: 58 mL/min — AB (ref >60)
Glucose, Bld: 95 mg/dL (ref 65–99)
Potassium: 3.9 mmol/L (ref 3.5–5.1)
Sodium: 127 mmol/L — ABNORMAL LOW (ref 135–145)
TOTAL PROTEIN: 5.2 g/dL — AB (ref 6.5–8.1)
Total Bilirubin: 0.3 mg/dL (ref 0.3–1.2)

## 2017-11-08 LAB — TYPE AND SCREEN
ABO/RH(D): O POS
ANTIBODY SCREEN: NEGATIVE

## 2017-11-08 LAB — DIFFERENTIAL
Basophils Absolute: 0 10*3/uL (ref 0.0–0.1)
Basophils Relative: 0 %
EOS PCT: 3 %
Eosinophils Absolute: 0.2 10*3/uL (ref 0.0–0.7)
LYMPHS PCT: 22 %
Lymphs Abs: 1.1 10*3/uL (ref 0.7–4.0)
MONO ABS: 0.2 10*3/uL (ref 0.1–1.0)
Monocytes Relative: 3 %
Neutro Abs: 3.4 10*3/uL (ref 1.7–7.7)
Neutrophils Relative %: 72 %

## 2017-11-08 LAB — URINALYSIS, ROUTINE W REFLEX MICROSCOPIC
Bacteria, UA: NONE SEEN
Bilirubin Urine: NEGATIVE
GLUCOSE, UA: NEGATIVE mg/dL
KETONES UR: NEGATIVE mg/dL
Leukocytes, UA: NEGATIVE
Nitrite: NEGATIVE
PROTEIN: 30 mg/dL — AB
Specific Gravity, Urine: 1.008 (ref 1.005–1.030)
pH: 7 (ref 5.0–8.0)

## 2017-11-08 LAB — GLUCOSE, CAPILLARY
GLUCOSE-CAPILLARY: 108 mg/dL — AB (ref 65–99)
GLUCOSE-CAPILLARY: 168 mg/dL — AB (ref 65–99)
GLUCOSE-CAPILLARY: 171 mg/dL — AB (ref 65–99)
Glucose-Capillary: 192 mg/dL — ABNORMAL HIGH (ref 65–99)
Glucose-Capillary: 92 mg/dL (ref 65–99)
Glucose-Capillary: 94 mg/dL (ref 65–99)

## 2017-11-08 LAB — CREATININE, URINE, RANDOM: Creatinine, Urine: 72.98 mg/dL

## 2017-11-08 LAB — CBC
HCT: 35.1 % — ABNORMAL LOW (ref 36.0–46.0)
Hemoglobin: 11.7 g/dL — ABNORMAL LOW (ref 12.0–15.0)
MCH: 28.4 pg (ref 26.0–34.0)
MCHC: 33.3 g/dL (ref 30.0–36.0)
MCV: 85.2 fL (ref 78.0–100.0)
PLATELETS: 271 10*3/uL (ref 150–400)
RBC: 4.12 MIL/uL (ref 3.87–5.11)
RDW: 13.9 % (ref 11.5–15.5)
WBC: 4.7 10*3/uL (ref 4.0–10.5)

## 2017-11-08 LAB — PHOSPHORUS: Phosphorus: 3.4 mg/dL (ref 2.5–4.6)

## 2017-11-08 LAB — OSMOLALITY, URINE: Osmolality, Ur: 247 mOsm/kg — ABNORMAL LOW (ref 300–900)

## 2017-11-08 LAB — OSMOLALITY: OSMOLALITY: 264 mosm/kg — AB (ref 275–295)

## 2017-11-08 LAB — SODIUM, URINE, RANDOM: Sodium, Ur: 13 mmol/L

## 2017-11-08 LAB — TRIGLYCERIDES: Triglycerides: 152 mg/dL — ABNORMAL HIGH (ref <150)

## 2017-11-08 LAB — PREALBUMIN: PREALBUMIN: 10.7 mg/dL — AB (ref 18–38)

## 2017-11-08 LAB — MAGNESIUM: MAGNESIUM: 2.1 mg/dL (ref 1.7–2.4)

## 2017-11-08 LAB — TSH: TSH: 3.123 u[IU]/mL (ref 0.350–4.500)

## 2017-11-08 SURGERY — LAPAROSCOPY, DIAGNOSTIC
Anesthesia: General | Site: Abdomen

## 2017-11-08 MED ORDER — DEXAMETHASONE SODIUM PHOSPHATE 10 MG/ML IJ SOLN
INTRAMUSCULAR | Status: AC
  Filled 2017-11-08: qty 1

## 2017-11-08 MED ORDER — LIDOCAINE 2% (20 MG/ML) 5 ML SYRINGE
INTRAMUSCULAR | Status: AC
  Filled 2017-11-08: qty 5

## 2017-11-08 MED ORDER — HYDROMORPHONE HCL 2 MG/ML IJ SOLN
INTRAMUSCULAR | Status: AC
  Filled 2017-11-08: qty 1

## 2017-11-08 MED ORDER — MIDAZOLAM HCL 2 MG/2ML IJ SOLN
INTRAMUSCULAR | Status: AC
  Filled 2017-11-08: qty 2

## 2017-11-08 MED ORDER — PROPOFOL 10 MG/ML IV BOLUS
INTRAVENOUS | Status: AC
  Filled 2017-11-08: qty 20

## 2017-11-08 MED ORDER — DEXTROSE 5 % IV SOLN
INTRAVENOUS | Status: DC | PRN
  Administered 2017-11-08: 50 ug/min via INTRAVENOUS

## 2017-11-08 MED ORDER — ONDANSETRON HCL 4 MG/2ML IJ SOLN
INTRAMUSCULAR | Status: AC
  Filled 2017-11-08: qty 2

## 2017-11-08 MED ORDER — SODIUM CHLORIDE 0.9 % IV SOLN
2.0000 g | Freq: Two times a day (BID) | INTRAVENOUS | Status: AC
  Administered 2017-11-09: 2 g via INTRAVENOUS
  Filled 2017-11-08: qty 2

## 2017-11-08 MED ORDER — SUCCINYLCHOLINE CHLORIDE 200 MG/10ML IV SOSY
PREFILLED_SYRINGE | INTRAVENOUS | Status: DC | PRN
  Administered 2017-11-08: 30 mg via INTRAVENOUS

## 2017-11-08 MED ORDER — LIDOCAINE 2% (20 MG/ML) 5 ML SYRINGE
INTRAMUSCULAR | Status: DC | PRN
  Administered 2017-11-08: 1.5 mg/kg/h via INTRAVENOUS

## 2017-11-08 MED ORDER — METOCLOPRAMIDE HCL 5 MG/ML IJ SOLN
10.0000 mg | Freq: Four times a day (QID) | INTRAMUSCULAR | Status: DC | PRN
  Filled 2017-11-08: qty 2

## 2017-11-08 MED ORDER — SUCCINYLCHOLINE CHLORIDE 200 MG/10ML IV SOSY
PREFILLED_SYRINGE | INTRAVENOUS | Status: AC
  Filled 2017-11-08: qty 10

## 2017-11-08 MED ORDER — 0.9 % SODIUM CHLORIDE (POUR BTL) OPTIME
TOPICAL | Status: DC | PRN
  Administered 2017-11-08 (×3): 1000 mL

## 2017-11-08 MED ORDER — SUGAMMADEX SODIUM 200 MG/2ML IV SOLN
INTRAVENOUS | Status: DC | PRN
  Administered 2017-11-08: 100 mg via INTRAVENOUS

## 2017-11-08 MED ORDER — HYDROCODONE-ACETAMINOPHEN 7.5-325 MG PO TABS: 1.0000 | ORAL_TABLET | Freq: Once | ORAL | Status: DC | PRN

## 2017-11-08 MED ORDER — LACTATED RINGERS IR SOLN
Status: DC | PRN
  Administered 2017-11-08: 1000 mL

## 2017-11-08 MED ORDER — HYDROCORTISONE 2.5 % RE CREA: 1.0000 "application " | TOPICAL_CREAM | Freq: Four times a day (QID) | RECTAL | Status: DC | PRN

## 2017-11-08 MED ORDER — FENTANYL CITRATE (PF) 100 MCG/2ML IJ SOLN
INTRAMUSCULAR | Status: AC
  Filled 2017-11-08: qty 2

## 2017-11-08 MED ORDER — GUAIFENESIN-DM 100-10 MG/5ML PO SYRP: 10.0000 mL | ORAL_SOLUTION | ORAL | Status: DC | PRN

## 2017-11-08 MED ORDER — BUPIVACAINE-EPINEPHRINE (PF) 0.25% -1:200000 IJ SOLN
INTRAMUSCULAR | Status: AC
  Filled 2017-11-08: qty 30

## 2017-11-08 MED ORDER — PHENYLEPHRINE 40 MCG/ML (10ML) SYRINGE FOR IV PUSH (FOR BLOOD PRESSURE SUPPORT)
PREFILLED_SYRINGE | INTRAVENOUS | Status: AC
  Filled 2017-11-08: qty 10

## 2017-11-08 MED ORDER — ALUM & MAG HYDROXIDE-SIMETH 200-200-20 MG/5ML PO SUSP: 30.0000 mL | Freq: Four times a day (QID) | ORAL | Status: DC | PRN

## 2017-11-08 MED ORDER — BUPIVACAINE LIPOSOME 1.3 % IJ SUSP
INTRAMUSCULAR | Status: DC | PRN
  Administered 2017-11-08: 20 mL

## 2017-11-08 MED ORDER — HYDROMORPHONE HCL 1 MG/ML IJ SOLN
0.5000 mg | INTRAMUSCULAR | Status: DC | PRN
  Administered 2017-11-08 (×2): 1 mg via INTRAVENOUS
  Administered 2017-11-09: 0.5 mg via INTRAVENOUS
  Administered 2017-11-09: 1 mg via INTRAVENOUS
  Administered 2017-11-09: 0.5 mg via INTRAVENOUS
  Administered 2017-11-09 – 2017-11-10 (×2): 1 mg via INTRAVENOUS
  Administered 2017-11-10: 0.5 mg via INTRAVENOUS
  Administered 2017-11-11 – 2017-11-13 (×2): 1 mg via INTRAVENOUS
  Filled 2017-11-08 (×10): qty 1

## 2017-11-08 MED ORDER — PHENYLEPHRINE HCL 10 MG/ML IJ SOLN
INTRAMUSCULAR | Status: AC
  Filled 2017-11-08: qty 2

## 2017-11-08 MED ORDER — PHENYLEPHRINE 40 MCG/ML (10ML) SYRINGE FOR IV PUSH (FOR BLOOD PRESSURE SUPPORT)
PREFILLED_SYRINGE | INTRAVENOUS | Status: DC | PRN
  Administered 2017-11-08 (×3): 80 ug via INTRAVENOUS

## 2017-11-08 MED ORDER — SODIUM CHLORIDE 0.9 % IV BOLUS
500.0000 mL | Freq: Once | INTRAVENOUS | Status: AC
  Administered 2017-11-08: 500 mL via INTRAVENOUS

## 2017-11-08 MED ORDER — FAT EMULSION PLANT BASED 20 % IV EMUL
240.0000 mL | INTRAVENOUS | Status: AC
  Administered 2017-11-08: 240 mL via INTRAVENOUS
  Filled 2017-11-08: qty 250

## 2017-11-08 MED ORDER — KETAMINE HCL 10 MG/ML IJ SOLN
INTRAMUSCULAR | Status: AC
  Filled 2017-11-08: qty 1

## 2017-11-08 MED ORDER — METOPROLOL TARTRATE 5 MG/5ML IV SOLN: 5.0000 mg | Freq: Four times a day (QID) | INTRAVENOUS | Status: DC | PRN

## 2017-11-08 MED ORDER — SODIUM CHLORIDE 0.9 % IV SOLN
INTRAVENOUS | Status: AC
  Filled 2017-11-08: qty 2

## 2017-11-08 MED ORDER — EPHEDRINE SULFATE-NACL 50-0.9 MG/10ML-% IV SOSY
PREFILLED_SYRINGE | INTRAVENOUS | Status: DC | PRN
  Administered 2017-11-08: 5 mg via INTRAVENOUS

## 2017-11-08 MED ORDER — MENTHOL 3 MG MT LOZG: 1.0000 | LOZENGE | OROMUCOSAL | Status: DC | PRN

## 2017-11-08 MED ORDER — ACETAMINOPHEN 650 MG RE SUPP: 650.0000 mg | Freq: Four times a day (QID) | RECTAL | Status: DC | PRN

## 2017-11-08 MED ORDER — DEXAMETHASONE SODIUM PHOSPHATE 4 MG/ML IJ SOLN
INTRAMUSCULAR | Status: DC | PRN
  Administered 2017-11-08: 4 mg via INTRAVENOUS

## 2017-11-08 MED ORDER — MIDAZOLAM HCL 5 MG/5ML IJ SOLN
INTRAMUSCULAR | Status: DC | PRN
  Administered 2017-11-08: 2 mg via INTRAVENOUS

## 2017-11-08 MED ORDER — ALBUMIN HUMAN 5 % IV SOLN
25.0000 g | Freq: Four times a day (QID) | INTRAVENOUS | Status: AC | PRN
  Filled 2017-11-08: qty 500

## 2017-11-08 MED ORDER — PROPOFOL 10 MG/ML IV BOLUS
INTRAVENOUS | Status: DC | PRN
  Administered 2017-11-08: 100 mg via INTRAVENOUS

## 2017-11-08 MED ORDER — KETAMINE HCL 10 MG/ML IJ SOLN
INTRAMUSCULAR | Status: DC | PRN
  Administered 2017-11-08: 15 mg via INTRAVENOUS
  Administered 2017-11-08: 10 mg via INTRAVENOUS

## 2017-11-08 MED ORDER — DIPHENHYDRAMINE HCL 50 MG/ML IJ SOLN: 12.5000 mg | Freq: Four times a day (QID) | INTRAMUSCULAR | Status: DC | PRN

## 2017-11-08 MED ORDER — LORAZEPAM 2 MG/ML IJ SOLN
0.5000 mg | Freq: Three times a day (TID) | INTRAMUSCULAR | Status: DC | PRN
  Administered 2017-11-11: 1 mg via INTRAVENOUS
  Administered 2017-11-13: 0.5 mg via INTRAVENOUS
  Filled 2017-11-08 (×2): qty 1

## 2017-11-08 MED ORDER — FENTANYL CITRATE (PF) 100 MCG/2ML IJ SOLN
INTRAMUSCULAR | Status: DC | PRN
  Administered 2017-11-08 (×2): 50 ug via INTRAVENOUS
  Administered 2017-11-08: 100 ug via INTRAVENOUS

## 2017-11-08 MED ORDER — EPHEDRINE SULFATE 50 MG/ML IJ SOLN
INTRAMUSCULAR | Status: AC
Start: 2017-11-08 — End: ?
  Filled 2017-11-08: qty 1

## 2017-11-08 MED ORDER — MAGIC MOUTHWASH
15.0000 mL | Freq: Four times a day (QID) | ORAL | Status: DC | PRN
Start: 2017-11-08 — End: 2017-11-24
  Filled 2017-11-08: qty 15

## 2017-11-08 MED ORDER — HYDROMORPHONE HCL 1 MG/ML IJ SOLN
INTRAMUSCULAR | Status: DC | PRN
  Administered 2017-11-08 (×3): 0.5 mg via INTRAVENOUS

## 2017-11-08 MED ORDER — METOPROLOL TARTRATE 5 MG/5ML IV SOLN
2.5000 mg | Freq: Once | INTRAVENOUS | Status: AC
  Administered 2017-11-08: 2.5 mg via INTRAVENOUS
  Filled 2017-11-08: qty 5

## 2017-11-08 MED ORDER — SODIUM CHLORIDE 0.9 % IV SOLN
INTRAVENOUS | Status: DC | PRN
  Administered 2017-11-08: 12:00:00 via INTRAVENOUS

## 2017-11-08 MED ORDER — ACETAMINOPHEN 10 MG/ML IV SOLN: 1000.0000 mg | Freq: Once | INTRAVENOUS | Status: DC | PRN

## 2017-11-08 MED ORDER — PHENOL 1.4 % MT LIQD: 1.0000 | OROMUCOSAL | Status: DC | PRN

## 2017-11-08 MED ORDER — SUGAMMADEX SODIUM 200 MG/2ML IV SOLN
INTRAVENOUS | Status: AC
  Filled 2017-11-08: qty 2

## 2017-11-08 MED ORDER — ALBUMIN HUMAN 5 % IV SOLN
INTRAVENOUS | Status: AC
Start: 2017-11-08 — End: 2017-11-09
  Filled 2017-11-08: qty 250

## 2017-11-08 MED ORDER — PROCHLORPERAZINE EDISYLATE 10 MG/2ML IJ SOLN: 5.0000 mg | INTRAMUSCULAR | Status: DC | PRN

## 2017-11-08 MED ORDER — HYDROCORTISONE 1 % EX CREA: 1.0000 "application " | TOPICAL_CREAM | Freq: Three times a day (TID) | CUTANEOUS | Status: DC | PRN

## 2017-11-08 MED ORDER — SCOPOLAMINE 1 MG/3DAYS TD PT72
1.0000 | MEDICATED_PATCH | TRANSDERMAL | Status: DC
  Administered 2017-11-08: 1.5 mg via TRANSDERMAL
  Filled 2017-11-08: qty 1

## 2017-11-08 MED ORDER — TRACE MINERALS CR-CU-MN-SE-ZN 10-1000-500-60 MCG/ML IV SOLN
INTRAVENOUS | Status: AC
  Administered 2017-11-08: 18:00:00 via INTRAVENOUS
  Filled 2017-11-08: qty 1440

## 2017-11-08 MED ORDER — ALBUMIN HUMAN 5 % IV SOLN
INTRAVENOUS | Status: DC | PRN
  Administered 2017-11-08: 14:00:00 via INTRAVENOUS

## 2017-11-08 MED ORDER — SODIUM CHLORIDE 0.9 % IV SOLN
INTRAVENOUS | Status: DC
Start: 2017-11-08 — End: 2017-11-08
  Administered 2017-11-08: 11:00:00 via INTRAVENOUS

## 2017-11-08 MED ORDER — LACTATED RINGERS IV SOLN
INTRAVENOUS | Status: DC | PRN
  Administered 2017-11-08: 15:00:00 via INTRAVENOUS

## 2017-11-08 MED ORDER — ALBUMIN HUMAN 5 % IV SOLN
12.5000 g | Freq: Once | INTRAVENOUS | Status: AC
  Administered 2017-11-08: 12.5 g via INTRAVENOUS

## 2017-11-08 MED ORDER — MEPERIDINE HCL 50 MG/ML IJ SOLN: 6.2500 mg | INTRAMUSCULAR | Status: DC | PRN

## 2017-11-08 MED ORDER — HYDROMORPHONE HCL 1 MG/ML IJ SOLN: 0.2500 mg | INTRAMUSCULAR | Status: DC | PRN

## 2017-11-08 MED ORDER — ROCURONIUM BROMIDE 10 MG/ML (PF) SYRINGE
PREFILLED_SYRINGE | INTRAVENOUS | Status: DC | PRN
  Administered 2017-11-08: 40 mg via INTRAVENOUS
  Administered 2017-11-08: 10 mg via INTRAVENOUS

## 2017-11-08 MED ORDER — ROCURONIUM BROMIDE 10 MG/ML (PF) SYRINGE
PREFILLED_SYRINGE | INTRAVENOUS | Status: AC
  Filled 2017-11-08: qty 5

## 2017-11-08 MED ORDER — BUPIVACAINE-EPINEPHRINE (PF) 0.25% -1:200000 IJ SOLN
INTRAMUSCULAR | Status: DC | PRN
  Administered 2017-11-08: 30 mL

## 2017-11-08 MED ORDER — LIP MEDEX EX OINT
1.0000 "application " | TOPICAL_OINTMENT | Freq: Two times a day (BID) | CUTANEOUS | Status: DC
  Administered 2017-11-08 – 2017-11-24 (×20): 1 via TOPICAL
  Filled 2017-11-08 (×3): qty 7

## 2017-11-08 MED ORDER — SODIUM CHLORIDE 0.9 % IV BOLUS: 500.0000 mL | Freq: Once | INTRAVENOUS | Status: DC

## 2017-11-08 MED ORDER — PROMETHAZINE HCL 25 MG/ML IJ SOLN: 6.2500 mg | INTRAMUSCULAR | Status: DC | PRN

## 2017-11-08 SURGICAL SUPPLY — 81 items
BAG URINE DRAINAGE (UROLOGICAL SUPPLIES) ×4 IMPLANT
BLADE 10 SAFETY STRL DISP (BLADE) ×4 IMPLANT
BLADE EXTENDED COATED 6.5IN (ELECTRODE) IMPLANT
BLADE HEX COATED 2.75 (ELECTRODE) IMPLANT
CABLE HIGH FREQUENCY MONO STRZ (ELECTRODE) ×4 IMPLANT
CATH FOLEY 2WAY SLVR  5CC 30FR (CATHETERS) ×2
CATH FOLEY 2WAY SLVR 5CC 30FR (CATHETERS) ×2 IMPLANT
CATH GASTROSTOMY 24FR (CATHETERS) ×4 IMPLANT
CATH KIT ON-Q SILVERSOAK 7.5IN (CATHETERS) IMPLANT
CHLORAPREP W/TINT 26ML (MISCELLANEOUS) ×4 IMPLANT
COUNTER NEEDLE 20 DBL MAG RED (NEEDLE) ×4 IMPLANT
COVER MAYO STAND STRL (DRAPES) ×4 IMPLANT
COVER SURGICAL LIGHT HANDLE (MISCELLANEOUS) ×4 IMPLANT
DECANTER SPIKE VIAL GLASS SM (MISCELLANEOUS) ×4 IMPLANT
DRAIN CHANNEL 19F RND (DRAIN) IMPLANT
DRAPE INCISE 23X17 IOBAN STRL (DRAPES) ×2
DRAPE INCISE IOBAN 23X17 STRL (DRAPES) ×2 IMPLANT
DRAPE LAPAROSCOPIC ABDOMINAL (DRAPES) ×4 IMPLANT
DRAPE SHEET LG 3/4 BI-LAMINATE (DRAPES) ×4 IMPLANT
DRAPE UTILITY XL STRL (DRAPES) ×8 IMPLANT
DRAPE WARM FLUID 44X44 (DRAPE) ×4 IMPLANT
DRSG OPSITE POSTOP 4X10 (GAUZE/BANDAGES/DRESSINGS) IMPLANT
DRSG OPSITE POSTOP 4X6 (GAUZE/BANDAGES/DRESSINGS) IMPLANT
DRSG OPSITE POSTOP 4X8 (GAUZE/BANDAGES/DRESSINGS) IMPLANT
DRSG TEGADERM 2-3/8X2-3/4 SM (GAUZE/BANDAGES/DRESSINGS) ×4 IMPLANT
DRSG TEGADERM 4X4.75 (GAUZE/BANDAGES/DRESSINGS) IMPLANT
DRSG VAC ATS SM SENSATRAC (GAUZE/BANDAGES/DRESSINGS) ×4 IMPLANT
ELECT CAUTERY BLADE 6.4 (BLADE) ×4 IMPLANT
ELECT PENCIL ROCKER SW 15FT (MISCELLANEOUS) ×4 IMPLANT
ELECT REM PT RETURN 15FT ADLT (MISCELLANEOUS) ×4 IMPLANT
GAUZE SPONGE 2X2 8PLY STRL LF (GAUZE/BANDAGES/DRESSINGS) ×2 IMPLANT
GAUZE SPONGE 4X4 12PLY STRL (GAUZE/BANDAGES/DRESSINGS) ×4 IMPLANT
GLOVE ECLIPSE 8.0 STRL XLNG CF (GLOVE) ×8 IMPLANT
GLOVE INDICATOR 8.0 STRL GRN (GLOVE) ×8 IMPLANT
GOWN STRL REUS W/TWL XL LVL3 (GOWN DISPOSABLE) ×12 IMPLANT
HANDLE SUCTION POOLE (INSTRUMENTS) ×4 IMPLANT
IRRIG SUCT STRYKERFLOW 2 WTIP (MISCELLANEOUS) ×4
IRRIGATION SUCT STRKRFLW 2 WTP (MISCELLANEOUS) ×2 IMPLANT
KIT BASIN OR (CUSTOM PROCEDURE TRAY) ×4 IMPLANT
LEGGING LITHOTOMY PAIR STRL (DRAPES) IMPLANT
LIGASURE IMPACT 36 18CM CVD LR (INSTRUMENTS) ×4 IMPLANT
PACK GENERAL/GYN (CUSTOM PROCEDURE TRAY) ×4 IMPLANT
PAD POSITIONING PINK XL (MISCELLANEOUS) ×4 IMPLANT
PORT LAP GEL ALEXIS MED 5-9CM (MISCELLANEOUS) ×4 IMPLANT
POSITIONER SURGICAL ARM (MISCELLANEOUS) IMPLANT
RELOAD AUTO 90-3.5 TA90 BLE (ENDOMECHANICALS) ×4 IMPLANT
RELOAD PROXIMATE 75MM BLUE (ENDOMECHANICALS) ×16 IMPLANT
SCISSORS LAP 5X35 DISP (ENDOMECHANICALS) ×4 IMPLANT
SEALER TISSUE G2 STRG ARTC 35C (ENDOMECHANICALS) IMPLANT
SLEEVE XCEL OPT CAN 5 100 (ENDOMECHANICALS) ×8 IMPLANT
SPONGE GAUZE 2X2 STER 10/PKG (GAUZE/BANDAGES/DRESSINGS) ×2
SPONGE LAP 18X18 X RAY DECT (DISPOSABLE) ×8 IMPLANT
STAPLER 90 3.5 STAND SLIM (STAPLE) ×4
STAPLER 90 3.5 STD SLIM (STAPLE) ×2 IMPLANT
STAPLER PROXIMATE 75MM BLUE (STAPLE) ×4 IMPLANT
STAPLER VISISTAT 35W (STAPLE) IMPLANT
SUCTION POOLE HANDLE (INSTRUMENTS) ×8
SUT MNCRL AB 4-0 PS2 18 (SUTURE) ×4 IMPLANT
SUT PDS AB 1 CT1 27 (SUTURE) ×8 IMPLANT
SUT PDS AB 1 CTX 36 (SUTURE) IMPLANT
SUT PDS AB 1 TP1 96 (SUTURE) IMPLANT
SUT PDS AB 2-0 CT2 27 (SUTURE) ×4 IMPLANT
SUT PROLENE 2 0 SH DA (SUTURE) IMPLANT
SUT SILK 2 0 (SUTURE) ×2
SUT SILK 2 0 SH CR/8 (SUTURE) ×8 IMPLANT
SUT SILK 2-0 18XBRD TIE 12 (SUTURE) ×2 IMPLANT
SUT SILK 3 0 (SUTURE) ×2
SUT SILK 3 0 SH CR/8 (SUTURE) ×4 IMPLANT
SUT SILK 3-0 18XBRD TIE 12 (SUTURE) ×2 IMPLANT
SUT VIC AB 2-0 SH 18 (SUTURE) ×12 IMPLANT
SUT VICRYL 0 UR6 27IN ABS (SUTURE) IMPLANT
TAPE UMBILICAL COTTON 1/8X30 (MISCELLANEOUS) ×4 IMPLANT
TOWEL OR 17X26 10 PK STRL BLUE (TOWEL DISPOSABLE) ×8 IMPLANT
TOWEL OR NON WOVEN STRL DISP B (DISPOSABLE) ×8 IMPLANT
TRAY FOLEY W/BAG SLVR 14FR (SET/KITS/TRAYS/PACK) ×4 IMPLANT
TRAY LAPAROSCOPIC (CUSTOM PROCEDURE TRAY) ×4 IMPLANT
TROCAR BLADELESS OPT 5 100 (ENDOMECHANICALS) ×4 IMPLANT
TROCAR XCEL NON-BLD 11X100MML (ENDOMECHANICALS) IMPLANT
TUBING INSUF HEATED (TUBING) ×4 IMPLANT
YANKAUER SUCT BULB TIP 10FT TU (MISCELLANEOUS) ×8 IMPLANT
YANKAUER SUCT BULB TIP NO VENT (SUCTIONS) ×4 IMPLANT

## 2017-11-08 NOTE — Progress Notes (Signed)
PMT no charge note  Patient seen briefly this am, before her surgery this morning, she seems to be in good spirits, family was with her.   PMT will continue to follow hospital course and assist with decision making, disposition planning and further goals of care discussions.   Loistine Chance MD [facility] health palliative medicine team [phone]

## 2017-11-08 NOTE — Progress Notes (Signed)
[facility] NOTE   Pharmacy Consult for TPN Indication: Intolerance to enteral feeding    Patient Measurements: Height: 5' (152.4 cm) Weight: 108 lb (49 kg) IBW/kg (Calculated) : 45.5 TPN AdjBW (KG): 49 Body mass index is 21.09 kg/m.   Insulin Requirements: none  Current Nutrition: NPO  IVF: NS @ 63mls/hr  Central access: PICC 5/25 TPN start date: 5/28  ASSESSMENT                                                                                                          HPI:  Patient has metastatic breast cancer and partial small bowel obstruction on full liquids for 4 months, now intolerance to clear liquids.   Significant events:  5/29 surgery Today:    Glucose - at goal of <150  Electrolytes -Na low, all others WNL  Renal - Scr 1.02, CrCl ~ 41.1  LFTs - WNL  TGs - 152  Prealbumin - 10.7  NUTRITIONAL GOALS                                                                                             RD recs: pending Clinimix E 5/15 at a goal rate of 60 ml/hr + 20% fat emulsion at 75ml/hr to provide: 72g/day protein, 1502 Kcal/day.  PLAN                                                                                                                         At 1800 today:  Increase Clinimix E 5/15 to 84ml/hr.  20% fat emulsion at 52ml/hr.  F/u RD's recs  TPN to contain standard multivitamins and trace elements.  IV fluids per surgery  Continue q6h SSI .   TPN lab panels on Mondays & Thursdays.  F/u daily.   Dolly Rias RPh 11/08/2017, 12:19 PM Pager [phone]

## 2017-11-08 NOTE — Progress Notes (Signed)
PROGRESS NOTE    Sonia Hill  WUJ:[id number] DOB: 1956/02/28 DOA: 11/03/2017 PCP: [name], MD    Brief Narrative:  37 female-metastatic breast cancer (magrinat)-on Verzenio Colostomy + rectal mass 04/2017-complicated by SBO-recurrent SBO 06/2017 and currently on liquid diet at home  Admit severe excruciating abdominal pain no ostomy output CT = worsening cancer-blood pressures stable Main lab abnormalities potassium 2.5 calcium 5.6 glucose 55 treated initially No NG tube-General surgery and oncology have consulted on the patient It appears that patient may be going for exploratory surgery 5/28 and is n.p.o. after     Assessment & Plan:   Principal Problem:   Small bowel obstruction from metastasis s/p SB resection 11/08/2017 Active Problems:   Breast cancer metastasized to multiple sites 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)   Hypoglycemia   Hypokalemia   Hypocalcemia   #1 malignant bowel obstruction/small bowel obstruction secondary to mets status post small bowel resection 11/08/2017 And noted to have no significant improvement with small bowel obstruction.  Patient underwent laparoscopic diagnostic small bowel resection, insertion of gastrostomy tube, lysis of adhesion, partial colectomy with new colostomy per Dr. Johney Maine 11/08/2017.  NG tube in place.  Per general surgery.  #2 hyponatremia Likely secondary to hypovolemic hyponatremia plus or minus SIADH.  Patient noted to have lower extremity edema as well as low albumin levels.  Patient given a dose of IV Lasix yesterday.  Patient with a urine output of 1.6 L over the past 24 hours.  Check a urine and serum osmolality.  Check a urine sodium.  Check a urine creatinine.  Check a TSH.  Will monitor for now.  If worsening hyponatremia in the morning we will give a dose of IV Lasix and follow.  3.   Hypokalemia/hypocalcemia/hypomagnesemia Patient on TPN.  Replete.    4.  Hypothyroidism  Check a TSH.  Continue IV Synthroid.  5.  Metastatic estrogen positive lobular breast cancer Patient with metastatic spread to the spine and rectal area resulting in blockage and diverging colostomy.  Oncology following.  6.  Solitary kidney hydronephrosis Status post Foley catheter placement.  Renal function stable.  Outpatient follow-up.  7.  Anxiety Ativan as needed.  8 severe protein calorie malnutrition On TPN.     DVT prophylaxis: Lovenox. Code Status: Full Family Communication: Updated patient and family at bedside. Disposition Plan: Per general surgery.   Consultants:   General Surgery: [name] 11/04/2017  Palliative care: Dr. Rowe Pavy 10/30/2017  Procedures:   CT abdomen and pelvis 11/03/2017  Abdominal films 11/07/2017, 11/05/2017, 11/02/2017  Laparoscopic diagnostic small bowel resection, insertion of gastrostomy tube, lysis of adhesion, partial colectomy with new colostomy per Dr. Johney Maine 11/08/2017  Antimicrobials:   None   Subjective: Patient just returned from the OR.  Sleeping however easily arousable.  Denies any chest pain.  No shortness of breath.  Per family patient was noted to have significant abdominal distention this morning prior to surgery.  Objective: Vitals:   11/08/17 1645 11/08/17 1700 11/08/17 1715 11/08/17 1730  BP: 129/79 127/87 134/75   Pulse: 94 98 96   Resp: 11 11 11    Temp: (!) 96.4 F (35.8 C) (!) 97.4 F (36.3 C) (!) 97.5 F (36.4 C) (!) 97.5 F (36.4 C)  TempSrc:      SpO2: 100% 100% 100%   Weight:      Height:        Intake/Output  Summary (Last 24 hours) at 11/08/2017 1859 Last data filed at 11/08/2017 1854 Gross per 24 hour  Intake 3075.67 ml  Output 1700 ml  Net 1375.67 ml   Filed Weights   11/04/17 0053 11/08/17 1100  Weight: 49.3 kg (108 lb 11 oz) 49 kg (108 lb)    Examination:  General exam: Appears calm and  comfortable.  NG tube in place Respiratory system: Clear to auscultation anterior lung fields. Respiratory effort normal. Cardiovascular system: S1 & S2 heard, RRR. No JVD, murmurs, rubs, gallops or clicks.  1-2+ bilateral lower extremity edema. Gastrointestinal system: Abdomen is soft, hypoactive bowel sounds, nondistended, wound VAC in place, colostomy noted.  Central nervous system: Alert and oriented. No focal neurological deficits. Extremities: 1-2+ bilateral lower extremity edema.  Symmetric 5 x 5 power. Skin: No rashes, lesions or ulcers Psychiatry: Judgement and insight appear normal. Mood & affect appropriate.     Data Reviewed: I have personally reviewed following labs and imaging studies  CBC: Recent Labs  Lab 11/03/17 1649 11/05/17 0414 11/06/17 0421 11/07/17 0417 11/08/17 0424  WBC 9.5 4.6 4.0 6.2 4.7  NEUTROABS 8.0* 3.4 2.9 5.0 3.4  HGB 14.3 11.2* 10.9* 12.0 11.7*  HCT 41.0 33.4* 31.6* 35.5* 35.1*  MCV 83.7 84.6 83.8 85.5 85.2  PLT 419* 329 319 328 161   Basic Metabolic Panel: Recent Labs  Lab 11/04/17 0311 11/04/17 0841 11/05/17 0414 11/06/17 0421 11/07/17 0417 11/08/17 0424  NA  --  126* 129* 130*  130* 130*  130* 127*  K  --  4.5 3.9 3.6  3.6 4.0  4.0 3.9  CL  --  99* 100* 97*  96* 95*  94* 93*  CO2  --  20* 20* 26  26 26  26 26   GLUCOSE  --  88 84 100*  99 86  85 95  BUN  --  14 10 8  8 7  7 11   CREATININE  --  0.90 0.92 0.91  0.91 1.05*  0.97 1.02*  CALCIUM  --  9.0 8.6* 8.2*  8.3* 8.9  8.9 8.8*  MG 1.7  --   --   --  1.7 2.1  PHOS  --   --   --  2.4* 2.7 3.4   GFR: Estimated Creatinine Clearance: 41.1 mL/min (A) (by C-G formula based on SCr of 1.02 mg/dL (H)). Liver Function Tests: Recent Labs  Lab 11/03/17 1828 11/05/17 0414 11/06/17 0421 11/07/17 0417 11/08/17 0424  AST 17 25 26 29 31   ALT 7* 10* 10* 9* 13*  ALKPHOS 38 53 48 57 64  BILITOT 0.5 0.5 0.4 0.6 0.3  PROT 3.4* 4.8* 4.4* 5.2* 5.2*  ALBUMIN 1.9* 2.7* 2.4*   2.5* 2.8*  2.8* 2.9*   Recent Labs  Lab 11/03/17 1828  LIPASE 37   No results for input(s): AMMONIA in the last 168 hours. Coagulation Profile: No results for input(s): INR, PROTIME in the last 168 hours. Cardiac Enzymes: No results for input(s): CKTOTAL, CKMB, CKMBINDEX, TROPONINI in the last 168 hours. BNP (last 3 results) No results for input(s): PROBNP in the last 8760 hours. HbA1C: No results for input(s): HGBA1C in the last 72 hours. CBG: Recent Labs  Lab 11/07/17 2056 11/08/17 0057 11/08/17 0450 11/08/17 0748 11/08/17 1823  GLUCAP 93 108* 94 92 171*   Lipid Profile: Recent Labs    11/08/17 0424  TRIG 152*   Thyroid Function Tests: Recent Labs    11/08/17 0930  TSH 3.123   Anemia  Panel: No results for input(s): VITAMINB12, FOLATE, FERRITIN, TIBC, IRON, RETICCTPCT in the last 72 hours. Sepsis Labs: Recent Labs  Lab 11/03/17 1706 11/03/17 1831  LATICACIDVEN 1.87 0.58    Recent Results (from the past 240 hour(s))  Urine culture     Status: Abnormal   Collection Time: 11/03/17  4:49 PM  Result Value Ref Range Status   Specimen Description   Final    URINE, CLEAN CATCH Performed at [facility] [address], [place], [place] [address]    Special Requests   Final    NONE Performed at [facility], [place] [address], [place], [place] [address]    Culture MULTIPLE SPECIES PRESENT, SUGGEST RECOLLECTION (A)  Final   Report Status 11/04/2017 FINAL  Final  MRSA PCR Screening     Status: None   Collection Time: 11/07/17  2:43 PM  Result Value Ref Range Status   MRSA by PCR NEGATIVE NEGATIVE Final    Comment:        The GeneXpert MRSA Assay (FDA approved for NASAL specimens only), is one component of a comprehensive MRSA colonization surveillance program. It is not intended to diagnose MRSA infection nor to guide or monitor treatment for MRSA infections. Performed at [facility], [place]  [address], [place], [place] [address]          Radiology Studies: Dg Abd 2 Views  Result Date: 11/07/2017 CLINICAL DATA:  62 year old female with small bowel obstruction. Recent colonic surgery. Subsequent encounter. EXAM: ABDOMEN - 2 VIEW COMPARISON:  11/05/2017 plain film examination.  11/03/2017 CT. FINDINGS: Gas and fluid distended dilated small bowel loops more prominent than on the recent plain film examination with small bowel measuring up to 4.7 cm versus prior 4.5 cm. Small bowel fold thickening. Air-fluid levels. Interval development of gas filled stomach. Minimal residual contrast within colon. Colostomy left lower quadrant. No free intraperitoneal air noted. IMPRESSION: Progressive partial small bowel obstructive pattern. Interval development of gas-filled stomach. No free intraperitoneal air. These results will be called to the ordering clinician or representative by the Radiologist Assistant, and communication documented in the PACS or zVision Dashboard. Electronically Signed   By: Genia Del M.D.   On: 11/07/2017 09:05        Scheduled Meds: . enoxaparin (LOVENOX) injection  40 mg Subcutaneous QHS  . levothyroxine  50 mcg Intravenous Daily  . lip balm  1 application Topical BID   Continuous Infusions: . albumin human    . albumin human    . [START ON 11/09/2017] cefoTEtan (CEFOTAN) IV    . Marland KitchenTPN (CLINIMIX-E) Adult 60 mL/hr at 11/08/17 1820   And  . Fat emulsion 240 mL (11/08/17 1821)  . cefoTEtan (CEFOTAN) 2 GM IVPB (Mini-Bag Plus)       LOS: 5 days    Time spent: 35 minutes    Irine Seal, MD [facility] Pager [phone]  If 7PM-7AM, please contact night-coverage [email or web address] Password [facility] 11/08/2017, 6:59 PM

## 2017-11-08 NOTE — Anesthesia Procedure Notes (Signed)
Procedure Name: Intubation Date/Time: 11/08/2017 12:04 PM Performed by: Claudia Desanctis, CRNA Pre-anesthesia Checklist: Patient identified, Emergency Drugs available, Suction available and Patient being monitored Patient Re-evaluated:Patient Re-evaluated prior to induction Oxygen Delivery Method: Circle system utilized Preoxygenation: Pre-oxygenation with 100% oxygen Induction Type: IV induction, Rapid sequence and Cricoid Pressure applied Laryngoscope Size: 2 and Miller Grade View: Grade I Tube type: Oral Tube size: 7.0 mm Number of attempts: 1 Airway Equipment and Method: Stylet Placement Confirmation: ETT inserted through vocal cords under direct vision,  positive ETCO2 and breath sounds checked- equal and bilateral Secured at: 21 cm Tube secured with: Tape Dental Injury: Teeth and Oropharynx as per pre-operative assessment

## 2017-11-08 NOTE — Progress Notes (Addendum)
Initial Nutrition Assessment  DOCUMENTATION CODES:   Non-severe (moderate) malnutrition in context of chronic illness  INTERVENTION:  - Continue TPN:  Pt needs: 1700-1800 kcal, 85-95 grams protein, >/= 1.6 L fluid - Advance diet as medically feasible. - RD to monitor for nutrition support related plan.   NUTRITION DIAGNOSIS:   Moderate Malnutrition related to cancer and cancer related treatments, chronic illness as evidenced by moderate fat depletion, moderate muscle depletion.  GOAL:   Patient will meet greater than or equal to 90% of their needs  MONITOR:   Other (Comment), Skin, Labs, Weight trends, I & O's(TPN tolerance)  REASON FOR ASSESSMENT:   Consult New TPN/TNA  ASSESSMENT:   62 y.o. F with metastatic breast cancer currenlty undergoing treatment with rectal metastases admitted on 11/03/17 for small bowel obstruction. 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 PSH of diverting loop colostomy due to rectal mass - 2018.    [birth year]/29/19 Pt with diagnosis of Severe Malnutrition related to cancer and cancer related treatments, chronic illness as evidenced by percent weight loss, energy intake < or equal to 75% for > or equal to 1 month, severe fat depletion, moderate muscle depletion.    Pt to underwent bowel resection and had gastrostomy tube placed 11/08/17. Per surgeon note G-tube placed for decompression and malnutrition.   Pt reports seeing [place] who told her to eat 1700-1800 calories per day with 90-95 grams of protein per day with no more than 2 grams of fiber per meal. Pt has been on a full liquid diet making her own full liquid soups, vegan or whey shakes, bone broth, and collagen shakes; pt will add ghee or oil to her soups or meals to increase the calories. Pt will have 6 meals per day with 2 grams of fiber and 15-16 grams of protein at eat meal. Pt reports only putting liquids in her body that is good for her, eating  organic foods, minimally processed foods with limited to no preservatives.  Pt reports she lost 25 lbs (unable to specify timeframe) and was 85 lbs in February and her UBW is 108-110 lbs. Pt reports gaining 24 lbs back, but is unsure how much of that is fluid from her lymphedema. Pt reports that 1 month ago before her lymphedema began she was 101 lbs. Pt reports strictly adhering to the calorie and protein recommendations using "My Fitness Pal" to gain weight.   Pt reports not being seen recently by outpatient RD. Pt reports PTA her doctor wanted her to be on a full liquid diet for as long as she could as she had an intestinal stricture. Pt reports also seeing an integrative medicine specialist. Pt reports not wanting any chemotherapy. Pt reports that MD said she will be on TPN 4-10 days progressing to clear liquids and then a solid diet.   Pt at may have continued severe malnutrition, unable to document at this time given limited NFPE and fluid retention. Pt has also made some improvements since diagnosis, including weight gain (though some may be due to fluid) and energy/protein intake.    Medications reviewed: levothyroxine, TPN @ 60 ml/hr Clinimix 5/15 with electrolytes and 20% lipids @ 20 ml/hr providing 1502 kcal and 62 grams protein/day, ativan.    Labs reviewed: 129 (L), Cl 100 (L), BG 175 (H), BUN 21 (H), alkaline phosphatase 37 (L), albumin 2.3 (L), ALT 8 (L), total bilirubin 0.1 (L), WBC 2.3 (L), RBC 3.48 (L), hemoglobin 10.2 (L),  HCT 30 (L).   NUTRITION - FOCUSED PHYSICAL EXAM:    Most Recent Value  Orbital Region  Moderate depletion  Upper Arm Region  Severe depletion  Thoracic and Lumbar Region  Unable to assess [Fluid retention]  Buccal Region  Moderate depletion  Temple Region  Moderate depletion  Clavicle Bone Region  Moderate depletion  Clavicle and Acromion Bone Region  Severe depletion  Scapular Bone Region  Unable to assess  Dorsal Hand  Moderate depletion  Patellar  Region  Unable to assess [Fluid retention]  Anterior Thigh Region  Unable to assess [Fluid retention]  Posterior Calf Region  Unable to assess [Fluid retention]  Edema (RD Assessment)  Moderate  Hair  Reviewed  Eyes  Reviewed  Mouth  Reviewed  Skin  Reviewed  Nails  Unable to assess [nail polish]      Diet Order:   Diet Order           Diet NPO time specified  Diet effective midnight          EDUCATION NEEDS:   Education needs have been addressed  Skin:  Skin Assessment: Reviewed RN Assessment  Last BM:  11/09/17 - colostomy  Height:   Ht Readings from Last 1 Encounters:  11/08/17 5' (1.524 m)    Weight:   Wt Readings from Last 1 Encounters:  11/09/17 114 lb 6.7 oz (51.9 kg)   UBW: 91-105 lbs %UBW: 103%  Ideal Body Weight:  45.45 kg  BMI:  Body mass index is 22.35 kg/m.  Estimated Nutritional Needs:   Kcal:  1700-1800 kcal  Protein:  85-95 grams  Fluid:  >/= 1.6 L or per MD    Hope Budds, Dietetic Intern

## 2017-11-08 NOTE — Op Note (Signed)
11/08/2017  3:25 PM  PATIENT:  Sonia Hill  62 y.o. female  Patient Care Team: [name], MD as PCP - General (Family Medicine) Magrinat, Virgie Dad, MD as Consulting Physician (Oncology) Molli Posey, MD as Consulting Physician (Obstetrics and Gynecology) Laurence Spates, MD as Consulting Physician (Gastroenterology) Leighton Ruff, MD as Consulting Physician (General Surgery) Delrae Rend, MD as Consulting Physician (Endocrinology) Clarene Essex, MD as Consulting Physician (Gastroenterology) Donato Heinz, MD as Consulting Physician (Nephrology)  PRE-OPERATIVE DIAGNOSIS:  SMALL BOWEL OBSTRUCTION  POST-OPERATIVE DIAGNOSIS:  MALIGNANT BOWEL OBSTRUCTION  PROCEDURE:  Procedure(s): LAPAROSCOPY DIAGNOSTIC SMALL BOWEL RESECTION INSERTION OF GASTROSTOMY TUBE LYSIS OF ADHESION PARTIAL COLECTOMY WITH NEW COLOSTOMY  SURGEON:  Adin Hector, MD  ASSISTANT:  Obie Dredge, PA-C  ANESTHESIA:   local and general  EBL:  Total I/O In: 1250 [I.V.:1000; IV Piggyback:250] Out: 450 [Urine:250; Blood:200].  See anesthesia record  Delay start of Pharmacological VTE agent (>24hrs) due to surgical blood loss or risk of bleeding:  no  DRAINS: Gastrostomy Tube   SPECIMEN:  1.  Small bowel with 2 stricturing was at the jejunal/ileal junction. 2.  Loop colostomy with additional distal limb of transverse colon  DISPOSITION OF SPECIMEN:  PATHOLOGY  COUNTS:  YES  PLAN OF CARE: Admit to inpatient   PATIENT DISPOSITION:  PACU - hemodynamically stable.  INDICATION: Breast cancer survivor with metastatic disease to the peritoneum.  Status post diverting loop colostomy in past for rectosigmoid obstruction from tumor.  Has had worsening p.o. tolerance with evidence of bowel obstruction with transition point in small bowel.  No massive carcinosis but somewhat suspicious.  Was doing okay on full liquid diet for 4 months and now is the point where she cannot even tolerate liquids.   She wished to be somewhat aggressive medical oncology felt it was reasonable.  I offered diagnostic laparoscopy with possible bowel resection.  Possible gastrostomy tube.  Discussed at length with the patient many times.  Questions answered she agreed to proceed.  The anatomy & physiology of the digestive tract was discussed.  The pathophysiology of perforation was discussed.  Differential diagnosis such as perforated ulcer or colon, etc was discussed.   Natural history risks without surgery such as death was discussed.  I recommended abdominal exploration to diagnose & treat the source of the problem.  Laparoscopic & open techniques were discussed.   Risks such as bleeding, infection, abscess, leak, reoperation, bowel resection, possible ostomy, injury to other organs, need for repair of tissues / organs, hernia, heart attack, death, and other risks were discussed.   The risks of no intervention will lead to serious problems including death.   I expressed a good likelihood that surgery will address the problem.    Goals of post-operative recovery were discussed as well.  We will work to minimize complications although risks in an emergent setting are high.   Questions were answered.  The patient expressed understanding & wishes to proceed with surgery.       OR FINDINGS: Some focal areas of carcinomatosis on the omentum centrally.  Concrete stricturing of the colostomy with the both the proximal and distal limbs.  Resected about 8 cm of the loop colostomy to noninvolved colon.  Patient has a distal transverse colon mucous fistula in the left upper outer quadrant around 1:00.  Most of the colostomy is the proximal mid transverse colon.  Transition point at jejunum/ileum junction.  2 very tight strictures.  Not extrinsic compression but almost intraluminal.  The obvious  transition point.  Resected.  Some many fibrotic nodularity along the mesenteric side of the bowel for the last several feet suspicious  for subacute carcinomatosis.  No stricturing though.  Jejunum appeared to be uninvolved.  Pelvis with concrete adhesions left alone.  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  [place].  DESCRIPTION: Informed consent was confirmed.  The patient underwent general anaesthesia without difficulty.  The patient was positioned appropriately.  VTE prevention in place.  I intubated the colostomy with a Foley catheter and blew the balloon and held up to gravity bag.  It was extremely strictured and barely allowed a Foley balloon to pass.  The patient's abdomen was clipped, prepped, & draped in a sterile fashion.  Surgical timeout confirmed our plan.  Peritoneal entry with a laparoscopic port was obtained using optical entry technique in the right upper abdomen as the patient was positioned in reverse Trendelenburg.  Entry was clean.  I induced carbon dioxide insufflation.  Camera inspection revealed no injury.  Extra ports were carefully placed under direct laparoscopic visualization.  Patient small bowel did not seem to be have massive carcinomatosis.  There were a few focal dots in the left upper quadrant parietal peritoneum underneath the rib cage.  What omentum was left was somewhat thickened with some nodularity.  Freddrick March a few diffusions on the midline.  She had some moderate ascites that was aspirated for visualization.  I located the ileocecal junction and ran the ileum more proximally.  It was mobile and not involved.  Came down it became more thickened and felt an obvious hard white stricturing.  This was close to the transverse colon mesentery where the loop colostomy had been placed.  I ran more proximally to much more dilated bowel.  This seemed to be the obvious transition point.  The pelvis did have some adhesions but that was left and long.  Small bowel was not involved.  Given the fact  that there was a focal stricturing with multiple small bowel not involved in the colon not obviously involved, we decided to go and proceed with small bowel resection.  I did a upper midline incision with a wound protector for mini laparotomy.  Brought the small bowel up.  Found proximal distal limbs.  Did a side-to-side stapled anastomosis with a 75 GIA.  Transected the mesentery with some clamps and silk suture as well as LigaSure.  Stapled off the common defect with a TX 90 stapler.  One corner opened up.  I therefore redid the anastomosis taking a few more centimeters of bowel on either side.  Transected the mesentery and cleanly.  We did the anastomosis.  It was clean and viable.  Mesenteric defect closed with interrupted 3-0 silk suture.  At this point Drs. Marcello Moores and Lilia Pro came in to observe the case and offered advice.  With the laparotomy you can feel the colostomy and it was very hard concrete and nodular.  I was very concerned that this was a transition point as well.  I felt that most likely needed to transect this loop colostomy and revise it.  Dr. Marcello Moores agreed.  Because of this extra area and feeling a little more nodularity with the mini laparotomy, I felt she would need a gastrostomy tube for better decompression for anticipated prolonged ileus and malnutrition and probable progression of carcinomatosis in the future.  This would allow G-tube to be replaced more  easily if it was done surgically for focal point to be replaced later if she progressed in the future versus leaving in place indefinitely.  Side with transected the colostomy get that out of the way.  I transected the distal transverse colon just proximal to the loop colostomy with a 75 GIA.  The same thing with the distal limb.  I then transected around the colostomy at the skin through the subcu fascia and then circumferentially freed off the loop colostomy.  It was quite hard and nodular but came out through the wound protector  minilaparotomy.  The distal limb actually had another 2 cm of hard nodularity once this was freed off so I transected a little bit more.  I mobilized the colon off the greater omentum to get some mobility to help to get it to reach back to the left lower quadrant colostomy site.  Next decreased the gastrostomy tube.  I passed a 46 [place] Mickey in the left upper quadrant many fingerbreadths off the costal ridge.  I then secured the stomach to the left lower quadrant peritoneum with interrupted silk sutures.  To find out the superior ones.  I also made a 2-0 PDS pursestring around the greater curvature of the stomach at the site for the gastrostomy tube.  I can create a gastrostomy in place the G-tube into the lumen of the stomach.  I then tied the 2-0 PDS pursestring.  He also wrapped around the G-tube for an internal last sewing to hold the G-tube in place.  I blew the balloon up with 10 mL of sterile water.  Then placed a few more silk stitches around the stomach and peritoneum to have a good watertight seal circumferentially.  Later the gastrostomy tube was secured the skin with 2-0 Prolene interrupted sutures with also lasso stitches around the tube itself to keep it from falling out.  We changed gloves.  We did copious irrigation and ensured hemostasis.  Proximal distal limb was brought up with the old colostomy wound.  Closed the midline with #1 PDS in a running fashion.  Because of the contamination and malnutrition and need for colostomy revision, I do not think was a good idea to close the skin.  The port sites were closed Monocryl.  The midline wound was closed with a wound VAC in the subcutaneous tissues.  It held the seal.  We changed gloves.  I matured the colostomy mucous fistula with 2-0 Vicryl interrupted sutures.  The crystal limb that was a mucous fistula was quite narrowed and decompressed.  I was able to get a few stitches and secured to the edge as well as to the proximal limb for a mucous  fistula in the left upper lateral quadrant around 1:00.  1 cm lumen.  The rest of it was a colostomy.  Finger intubated.  There was thick pasty stool that came out.  Colostomy appliance placed.  Patient being asked me to get recovery room.  We will follow closely.   Adin Hector, M.D., F.A.C.S. Gastrointestinal and Minimally Invasive Surgery [facility] [address], [place], [place] [id number] [phone] Main / Paging

## 2017-11-08 NOTE — Progress Notes (Signed)
[facility] Progress Note  Day of Surgery  Subjective: CC:  C/o abdominal distention and edema.  Nausea s/p clears but no emesis.  Mobilizing independently.  Minimal gas and stool in colostomy pouch.  Endorses urinary hesitancy and retention that is baseline for her. Ready for surgery   Objective: Vital signs in last 24 hours: Temp:  [98.3 F (36.8 C)-98.9 F (37.2 C)] 98.9 F (37.2 C) (05/28 2058) Pulse Rate:  [86-93] 93 (05/28 2058) Resp:  [17-18] 18 (05/28 1610) BP: (112-131)/(70-83) 131/83 (05/28 2058) SpO2:  [98 %-100 %] 99 % (05/28 2058) Weight:  [49 kg (108 lb)] 49 kg (108 lb) (05/29 1100) Last BM Date: 11/08/17(per colostomy)  Intake/Output from previous day: 05/28 0701 - 05/29 0700 In: 924.2 [P.O.:300; I.V.:624.2] Out: 9604 [Urine:1600; Stool:75] Intake/Output this shift: Total I/O In: -  Out: 200 [Urine:200]  PE: General: Pt awake/alert/oriented x4 in no major acute distress.  Cachectic Eyes: PERRL, normal EOM. Sclera nonicteric Neuro: CN II-XII intact w/o focal sensory/motor deficits. Lymph: No head/neck/groin lymphadenopathy Psych:  No delerium/psychosis/paranoia HENT: Normocephalic, Mucus membranes moist.  No thrush Neck: Supple, No tracheal deviation Chest: No pain.  Good respiratory excursion. CV:  Pulses intact.  Regular rhythm MS: Normal AROM mjr joints.  No obvious deformity Abdomen: Soft, Moderately distended.  Colostomy no gas/stool.  Nontender.  No incarcerated hernias. Ext:  SCDs BLE.  No significant edema.  No cyanosis Skin: No petechiae / purpura   Lab Results:  Recent Labs    11/07/17 0417 11/08/17 0424  WBC 6.2 4.7  HGB 12.0 11.7*  HCT 35.5* 35.1*  PLT 328 271   BMET Recent Labs    11/07/17 0417 11/08/17 0424  NA 130*  130* 127*  K 4.0  4.0 3.9  CL 95*  94* 93*  CO2 26  26 26   GLUCOSE 86  85 95  BUN 7  7 11   CREATININE 1.05*  0.97 1.02*  CALCIUM 8.9  8.9 8.8*   PT/INR No results for input(s):  LABPROT, INR in the last 72 hours. CMP     Component Value Date/Time   NA 127 (L) 11/08/2017 0424   NA 134 (L) 12/28/2016 1503   K 3.9 11/08/2017 0424   K 4.0 12/28/2016 1503   CL 93 (L) 11/08/2017 0424   CL 98 12/03/2012 1419   CO2 26 11/08/2017 0424   CO2 25 12/28/2016 1503   GLUCOSE 95 11/08/2017 0424   GLUCOSE 96 12/28/2016 1503   GLUCOSE 89 12/03/2012 1419   BUN 11 11/08/2017 0424   BUN 12.0 12/28/2016 1503   CREATININE 1.02 (H) 11/08/2017 0424   CREATININE 0.81 09/13/2017 1157   CREATININE 0.8 12/28/2016 1503   CALCIUM 8.8 (L) 11/08/2017 0424   CALCIUM 9.7 12/28/2016 1503   PROT 5.2 (L) 11/08/2017 0424   PROT 6.5 12/28/2016 1503   ALBUMIN 2.9 (L) 11/08/2017 0424   ALBUMIN 3.9 12/28/2016 1503   AST 31 11/08/2017 0424   AST 25 09/13/2017 1157   AST 22 12/28/2016 1503   ALT 13 (L) 11/08/2017 0424   ALT 12 09/13/2017 1157   ALT 20 12/28/2016 1503   ALKPHOS 64 11/08/2017 0424   ALKPHOS 76 12/28/2016 1503   BILITOT 0.3 11/08/2017 0424   BILITOT 0.6 09/13/2017 1157   BILITOT 0.50 12/28/2016 1503   GFRNONAA 58 (L) 11/08/2017 0424   GFRNONAA >60 09/13/2017 1157   GFRAA >60 11/08/2017 0424   GFRAA >60 09/13/2017 1157   Lipase     Component  Value Date/Time   LIPASE 37 11/03/2017 1828       Studies/Results: Dg Abd 2 Views  Result Date: 11/07/2017 CLINICAL DATA:  62 year old female with small bowel obstruction. Recent colonic surgery. Subsequent encounter. EXAM: ABDOMEN - 2 VIEW COMPARISON:  11/05/2017 plain film examination.  11/03/2017 CT. FINDINGS: Gas and fluid distended dilated small bowel loops more prominent than on the recent plain film examination with small bowel measuring up to 4.7 cm versus prior 4.5 cm. Small bowel fold thickening. Air-fluid levels. Interval development of gas filled stomach. Minimal residual contrast within colon. Colostomy left lower quadrant. No free intraperitoneal air noted. IMPRESSION: Progressive partial small bowel obstructive  pattern. Interval development of gas-filled stomach. No free intraperitoneal air. These results will be called to the ordering clinician or representative by the Radiologist Assistant, and communication documented in the PACS or zVision Dashboard. Electronically Signed   By: Genia Del M.D.   On: 11/07/2017 09:05    Anti-infectives: Anti-infectives (From admission, onward)   Start     Dose/Rate Route Frequency Ordered Stop   11/08/17 1021  sodium chloride 0.9 % with cefoTEtan (CEFOTAN) ADS Med    Note to Pharmacy:  Marchia Meiers   : cabinet override      11/08/17 1021 11/08/17 2229   11/08/17 0600  cefoTEtan (CEFOTAN) 2 g in sodium chloride 0.9 % 100 mL IVPB  Status:  Discontinued    Note to Pharmacy:  Pharmacy may adjust dose strength for optimal dosing.   Send with patient on call to the OR.  Anesthesia to complete antibiotic administration <26min prior to incision per [facility].   2 g 200 mL/hr over 30 Minutes Intravenous On call to O.R. 11/07/17 1234 11/07/17 1433   11/08/17 0600  cefoTEtan in Dextrose 5% (CEFOTAN) IVPB 2 g     2 g Intravenous On call to O.R. 11/07/17 1505 11/09/17 0559   11/07/17 1500  cefoTEtan in Dextrose 5% (CEFOTAN) IVPB 2 g  Status:  Discontinued     2 g Intravenous On call to O.R. 11/07/17 1433 11/07/17 1505     Assessment/Plan metastatic lobular carcinoma of the breast s/p left mastectomy and sentinel node biopsy in 2011; metastasis to pelvis last year s/p laparoscopic diverting loop sigmoid colostomy 2018 in [place]. oncologist: Dr. [name]  pSBO suspect 2/2 metastatic breast CA w/ known pelvic metastasis  - persistent distention, nausea, and minimal colostomy output on CLD  - worsening pSBO pattern on AXR  - palliative care consult to discuss goals of care and quality of life after an operation. I have discussed this with the patient and she is agreeable and would like to have this conversation.  - diagnostic laparoscopy with lysis of adhesions, possible  laparotomy with bowel resection, possible gastrostomy tube.   The anatomy & physiology of the digestive tract was discussed.  The pathophysiology of perforation was discussed.  Differential diagnosis such as perforated ulcer or colon, etc was discussed.   Natural history risks without surgery such as death was discussed.  I recommended abdominal exploration to diagnose & treat the source of the problem.  Laparoscopic & open techniques were discussed.   Risks such as bleeding, infection, abscess, leak, reoperation, bowel resection, possible ostomy, injury to other organs, need for repair of tissues / organs, hernia, heart attack, death, and other risks were discussed.   The risks of no intervention will lead to serious problems including death.   I expressed a good likelihood that surgery will address the problem.  Goals of post-operative recovery were discussed as well.  We will work to minimize complications although risks in an emergent setting are high.   Questions were answered.  The patient expressed understanding & wishes to proceed with surgery.         - NPO, limited sips/ice ok -  TNA  - mobilize    LOS: 5 days   Adin Hector, M.D., F.A.C.S. Gastrointestinal and Minimally Invasive Surgery [facility] [address] [place], [place] [id number] [phone] Main / Paging

## 2017-11-08 NOTE — Transfer of Care (Signed)
Immediate Anesthesia Transfer of Care Note  Patient: Sonia Hill  Procedure(s) Performed: LAPAROSCOPY DIAGNOSTIC (N/A ) SMALL BOWEL RESECTION (N/A ) INSERTION OF GASTROSTOMY TUBE (N/A ) LYSIS OF ADHESION (N/A Abdomen) PARTIAL COLECTOMY WITH NEW COLOSTOMY (N/A Abdomen)  Patient Location: PACU  Anesthesia Type:General  Level of Consciousness: awake, alert , oriented and patient cooperative  Airway & Oxygen Therapy: Patient Spontanous Breathing and Patient connected to face mask oxygen  Post-op Assessment: Report given to RN, Post -op Vital signs reviewed and stable and Patient moving all extremities  Post vital signs: Reviewed and stable  Last Vitals:  Vitals Value Taken Time  BP    Temp    Pulse 93 11/08/2017  3:45 PM  Resp 12 11/08/2017  3:45 PM  SpO2 100 % 11/08/2017  3:45 PM  Vitals shown include unvalidated device data.  Last Pain:  Vitals:   11/08/17 1048  TempSrc:   PainSc: 0-No pain         Complications: No apparent anesthesia complications

## 2017-11-09 ENCOUNTER — Encounter (HOSPITAL_COMMUNITY): Payer: Self-pay | Admitting: Surgery

## 2017-11-09 DIAGNOSIS — E440 Moderate protein-calorie malnutrition: Secondary | ICD-10-CM

## 2017-11-09 DIAGNOSIS — I447 Left bundle-branch block, unspecified: Secondary | ICD-10-CM | POA: Diagnosis not present

## 2017-11-09 LAB — GLUCOSE, CAPILLARY
GLUCOSE-CAPILLARY: 123 mg/dL — AB (ref 65–99)
Glucose-Capillary: 178 mg/dL — ABNORMAL HIGH (ref 65–99)
Glucose-Capillary: 144 mg/dL — ABNORMAL HIGH (ref 65–99)
Glucose-Capillary: 119 mg/dL — ABNORMAL HIGH (ref 65–99)

## 2017-11-09 LAB — CBC
HCT: 30 % — ABNORMAL LOW (ref 36.0–46.0)
HEMOGLOBIN: 10.2 g/dL — AB (ref 12.0–15.0)
MCH: 29.3 pg (ref 26.0–34.0)
MCHC: 34 g/dL (ref 30.0–36.0)
MCV: 86.2 fL (ref 78.0–100.0)
PLATELETS: 206 10*3/uL (ref 150–400)
RBC: 3.48 MIL/uL — AB (ref 3.87–5.11)
RDW: 13.9 % (ref 11.5–15.5)
WBC: 2.3 10*3/uL — ABNORMAL LOW (ref 4.0–10.5)

## 2017-11-09 LAB — COMPREHENSIVE METABOLIC PANEL
ALT: 8 U/L — ABNORMAL LOW (ref 14–54)
AST: 25 U/L (ref 15–41)
Albumin: 2.3 g/dL — ABNORMAL LOW (ref 3.5–5.0)
Alkaline Phosphatase: 37 U/L — ABNORMAL LOW (ref 38–126)
Anion gap: 6 (ref 5–15)
BILIRUBIN TOTAL: 0.1 mg/dL — AB (ref 0.3–1.2)
BUN: 21 mg/dL — ABNORMAL HIGH (ref 6–20)
CHLORIDE: 100 mmol/L — AB (ref 101–111)
CO2: 23 mmol/L (ref 22–32)
CREATININE: 0.94 mg/dL (ref 0.44–1.00)
Calcium: 8 mg/dL — ABNORMAL LOW (ref 8.9–10.3)
GFR calc non Af Amer: >60 mL/min (ref >60)
Glucose, Bld: 175 mg/dL — ABNORMAL HIGH (ref 65–99)
POTASSIUM: 3.9 mmol/L (ref 3.5–5.1)
Sodium: 129 mmol/L — ABNORMAL LOW (ref 135–145)
TOTAL PROTEIN: 3.8 g/dL — AB (ref 6.5–8.1)

## 2017-11-09 LAB — PHOSPHORUS: PHOSPHORUS: 3.5 mg/dL (ref 2.5–4.6)

## 2017-11-09 LAB — URINE CULTURE: Culture: <10000 — AB

## 2017-11-09 LAB — TROPONIN I: Troponin I: <0.03 ng/mL (ref <0.03)

## 2017-11-09 LAB — HEMOGLOBIN AND HEMATOCRIT, BLOOD
HCT: 30.4 % — ABNORMAL LOW (ref 36.0–46.0)
Hemoglobin: 10.2 g/dL — ABNORMAL LOW (ref 12.0–15.0)

## 2017-11-09 LAB — MAGNESIUM: MAGNESIUM: 1.8 mg/dL (ref 1.7–2.4)

## 2017-11-09 MED ORDER — MAGNESIUM SULFATE 2 GM/50ML IV SOLN
2.0000 g | Freq: Once | INTRAVENOUS | Status: AC
  Administered 2017-11-09: 2 g via INTRAVENOUS
  Filled 2017-11-09: qty 50

## 2017-11-09 MED ORDER — INSULIN ASPART 100 UNIT/ML ~~LOC~~ SOLN
0.0000 [IU] | Freq: Four times a day (QID) | SUBCUTANEOUS | Status: DC
  Administered 2017-11-09 – 2017-11-11 (×5): 1 [IU] via SUBCUTANEOUS

## 2017-11-09 MED ORDER — TRACE MINERALS CR-CU-MN-SE-ZN 10-1000-500-60 MCG/ML IV SOLN
INTRAVENOUS | Status: AC
  Administered 2017-11-09: 18:00:00 via INTRAVENOUS
  Filled 2017-11-09: qty 1800

## 2017-11-09 MED ORDER — FUROSEMIDE 10 MG/ML IJ SOLN
20.0000 mg | Freq: Two times a day (BID) | INTRAMUSCULAR | Status: DC
  Administered 2017-11-09 – 2017-11-10 (×2): 20 mg via INTRAVENOUS
  Filled 2017-11-09 (×2): qty 2

## 2017-11-09 MED ORDER — FAT EMULSION PLANT BASED 20 % IV EMUL
240.0000 mL | INTRAVENOUS | Status: AC
  Administered 2017-11-09: 240 mL via INTRAVENOUS
  Filled 2017-11-09: qty 250

## 2017-11-09 MED ORDER — SODIUM CHLORIDE 0.9 % IV BOLUS
500.0000 mL | Freq: Once | INTRAVENOUS | Status: AC
  Administered 2017-11-09: 500 mL via INTRAVENOUS

## 2017-11-09 MED ORDER — LORAZEPAM 2 MG/ML IJ SOLN
0.5000 mg | INTRAMUSCULAR | Status: DC
  Administered 2017-11-09 – 2017-11-23 (×13): 0.5 mg via INTRAVENOUS
  Filled 2017-11-09 (×14): qty 1

## 2017-11-09 NOTE — Consult Note (Signed)
[facility] Nurse ostomy consult note Stoma type/location: LUQ colostomy Stomal assessment/size: assessed through pouch window. Pink, has small black area, questionable intact blood filled bulla.pouch that was applied in OR has been cut around it.  Peristomal assessment: not assessed Treatment options for stomal/peristomal skin: has skin barrier Output bloody drainage Ostomy pouching: 1pc has a one piece on, have brought both sizes of two piece set up for change.  Education provided: Pt has had a colostomy, this is a new colostomy.  Enrolled patient in [facility] program: No  Consult was for new ostomy. (Patient has had colostomy prior to admission)  When I arrived to change pouch I saw pt also has a NPWT vac dressing adjacent to the skin barrier from pouch. I could possibly remove pouch and patch vac dressing back but vac dressing has to be changed tomorrow and there will be no way to remove it and not dislodge pouch. Supplies are in room, both 2 1/4" and 2 3/4" pouches and barriers, barrier rings, adhesive spray remover, and measuring tool. Have ordered supplies and placed in room for NPWT dressing change tomorrow. Patient aware of plan and agrees. [facility] team will follow for ostomy care as well as NPWT change tomorrow.  Fara Olden, RN-C, WTA-C, [facility]ound Treatment Associate Ostomy Care Associate

## 2017-11-09 NOTE — Progress Notes (Signed)
PMT progress note  Patient is awake alert sitting up in chair, husband is at bedside, she rested well overnight, states that her pain medication is working well.   She states that she is happy with the outcome of her surgery, TPN has been started, she is eager to continue to improve, increase her mobility.   BP (!) 107/58 (BP Location: Right Leg)   Pulse 98   Temp 99.5 F (37.5 C) (Oral)   Resp 14   Ht 5' (1.524 m)   Wt 51.9 kg (114 lb 6.7 oz)   SpO2 95%   BMI 22.35 kg/m  Labs and imaging noted, op note reviewed  awake alert No distress Appears weak Regular S1 S2 Abdomen: patient states that her new colostomy is in a new location.  Trace edema  Malignant bowel obstruction Metastatic breast cancer Declining oral intake at home  Post op day 1, s/p LAPAROSCOPY DIAGNOSTIC SMALL BOWEL RESECTION, INSERTION OF GASTROSTOMY TUBE LYSIS OF ADHESION,PARTIAL COLECTOMY WITH NEW COLOSTOMY performed on 11-08-17.   Encouraged the patient to continue with current mode of care, to take it one day at a time, to continue to rest and offered supportive care and active listening.   Patient was reminiscing about how her mother also had breast cancer, she died of breast cancer, the patient had been regularly getting mammograms, her breast cancer was lobular, was diagnosed on MRI, in conjunction with mammogram. 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   PMT available to assist for supportive care.   25 minutes spent [facility] palliative medicine team [phone]

## 2017-11-09 NOTE — Anesthesia Postprocedure Evaluation (Signed)
Anesthesia Post Note  Patient: Sonia Hill  Procedure(s) Performed: LAPAROSCOPY DIAGNOSTIC (N/A ) SMALL BOWEL RESECTION (N/A ) INSERTION OF GASTROSTOMY TUBE (N/A ) LYSIS OF ADHESION (N/A Abdomen) PARTIAL COLECTOMY WITH NEW COLOSTOMY (N/A Abdomen)     Patient location during evaluation: PACU Anesthesia Type: General Level of consciousness: awake and alert Pain management: pain level controlled Vital Signs Assessment: post-procedure vital signs reviewed and stable Respiratory status: spontaneous breathing, nonlabored ventilation, respiratory function stable and patient connected to nasal cannula oxygen Cardiovascular status: blood pressure returned to baseline and stable Postop Assessment: no apparent nausea or vomiting Anesthetic complications: no    Last Vitals:  Vitals:   11/09/17 0243 11/09/17 0408  BP: (!) 102/55 (!) 107/58  Pulse: 98 98  Resp: (!) 8 14  Temp: 37.1 C 37.5 C  SpO2: 100% 100%    Last Pain:  Vitals:   11/09/17 0715  TempSrc:   PainSc: 2                  Barnet Glasgow

## 2017-11-09 NOTE — Plan of Care (Signed)
Patient stable during 7 a to 7 p shift, medicated for pain x 1.  Patient up to chair for several hours and walked to nurses station and back prior to going back to bed.  No nausea this shift.  Dr. Johney Maine removed NG tube this a.m., gastric tube remains to gravity drain with no output, colostomy with small amount of red liquid in bag this shift only.  No events on telemetry monitor this shift.  Husband at bedside.

## 2017-11-09 NOTE — Progress Notes (Signed)
Pt urine output since shift change at 1900 has been 11ml. Pt respirations have decreased to 6-8 per min. BP has decreased to 94/54 after lopressor given at 2342 d/t elevated HR. HR now stable in the 90s. Colostomy has had an output of 359ml of bloody liquid since shift change at 1900. NP on call has been notified of everything listed above. New orders placed. Will continue to monitor pt closely.

## 2017-11-09 NOTE — Progress Notes (Addendum)
PHARMACY - ADULT TOTAL PARENTERAL NUTRITION CONSULT NOTE   Pharmacy Consult for TPN Indication: Intolerance to enteral feeding    Patient Measurements: Height: 5' (152.4 cm) Weight: 114 lb 6.7 oz (51.9 kg) IBW/kg (Calculated) : 45.5 TPN AdjBW (KG): 49 Body mass index is 22.35 kg/m.   Insulin Requirements: CBGs 171, 168, 192, 178, 144,   Current Nutrition: NPO, ice chips  IVF: NS @ 50mls/hr- order has expired- RN to f/u w/ MD  Central access: PICC 5/25 TPN start date: 5/28  ASSESSMENT                                                                                                          HPI:  Patient has metastatic breast cancer and partial small bowel obstruction on full liquids for 4 months, now intolerance to clear liquids.   Significant events:  5/29 surgery Today:    Glucose - CBGs >150, will add sensitive SSI q6h  Electrolytes -Na low at 129, phos 3.5, mag 1.8 - prefer >= 2 with new LBBB  Renal - Scr 1.02, CrCl ~ 41.1  LFTs - WNL/low  TGs - 152  Prealbumin - 10.7  NUTRITIONAL GOALS                                                                                             RD recs: 5/30 1700-1800 Kcal and 85-95 gm protein Clinimix E 5/15 at a goal rate of 75 ml/hr + 20% fat emulsion at 34ml/hr to provide: 90 g/day protein, 1758 Kcal/day.  PLAN                                                                                                     Mag 2 gm IV x 1      At 1800 today:  increase Clinimix E 5/15 to 75 ml/hr.to meet 100% of goals  20% fat emulsion at 76ml/hr.  TPN to contain standard multivitamins and trace elements.  IV fluids per surgery- RN to f/u  add q6h sensitive SSI .   TPN lab panels on Mondays & Thursdays.  BMET and Mag & phos in AM  F/u daily.  Eudelia Bunch, Pharm.D. [phone] 11/09/2017 11:27 AM

## 2017-11-09 NOTE — Progress Notes (Signed)
PROGRESS NOTE    Sonia Hill  CXK:[id number] DOB: 10-23-1955 DOA: 11/03/2017 PCP: [name], MD    Brief Narrative:  17 female-metastatic breast cancer (magrinat)-on Verzenio Colostomy + rectal mass 04/2017-complicated by SBO-recurrent SBO 06/2017 and currently on liquid diet at home  Admit severe excruciating abdominal pain no ostomy output CT = worsening cancer-blood pressures stable Main lab abnormalities potassium 2.5 calcium 5.6 glucose 55 treated initially No NG tube-General surgery and oncology have consulted on the patient It appears that patient may be going for exploratory surgery 5/28 and is n.p.o. 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  Patient underwent laparoscopic diagnostic small bowel resection, insertion of gastrostomy tube, lysis of adhesion, partial colectomy with new colostomy per Dr. Johney Maine 11/08/2017.  NG tube in place.  Per general surgery.  #2 hyponatremia Likely secondary to hypervolemic hyponatremia plus or minus SIADH.  Patient noted to have lower extremity edema as well as low albumin levels.  Patient given a dose of IV Lasix.  Patient with a urine output of 0.450 L over the past 24 hours.  TSH within normal limits.  Some  improvement with hyponatremia.  Place on Lasix 20 mg IV every 12 hours and follow.    3.  Hypokalemia/hypocalcemia/hypomagnesemia Patient on TPN.  Electrolytes being repleted per pharmacy.   4.  Hypothyroidism  TSH 3.123.  Continue current dose IV Synthroid.   5.  Metastatic estrogen positive lobular breast cancer Patient with metastatic spread to the spine and rectal area resulting in blockage and diverging colostomy.  Oncology following.  6.  Solitary kidney hydronephrosis Status post Foley catheter placement.  Renal function stable.  Outpatient follow-up.  7.  Anxiety Ativan as needed.  8 severe protein calorie malnutrition On TPN.  9.  Volume overload Patient with lower extremity edema.  Likely component of hypoalbuminemia.  Current weight is 114 pounds from 108 pounds on 11/04/2017.  Blood pressure somewhat borderline.  Patient on Lasix 20 mill grams IV every 12 hours.  Strict I's and O's.  Daily weights.  Follow.  10.  New left bundle branch block Noted on EKG overnight.  Patient denies any chest pain no shortness of breath.  Check a 2D echo.  Troponin I negative x1.  Patient with recent surgery concern for possible demand ischemia.  Consult with cardiology for further evaluation and management.     DVT prophylaxis: Lovenox. Code Status: Full Family Communication: Updated patient and family at bedside. Disposition Plan: Per general surgery.   Consultants:   General Surgery: [name] 11/04/2017  Palliative care: Dr. Rowe Pavy 10/30/2017  Procedures:   CT abdomen and pelvis 11/03/2017  Abdominal films 11/07/2017, 11/05/2017, 11/02/2017  Laparoscopic diagnostic small bowel resection, insertion of gastrostomy tube, lysis of adhesion, partial colectomy with new colostomy per Dr. Johney Maine 11/08/2017  Antimicrobials:   None   Subjective: Patient sitting up in chair.  Denies any chest pain or shortness of breath.  Complaining of lower extremity edema and lower abdominal  swelling.  EKG done overnight noted with new left bundle branch block.   Objective: Vitals:   11/09/17 0243 11/09/17 0408 11/09/17 0830 11/09/17 1312  BP: (!) 102/55 (!) 107/58  (!) 102/58  Pulse: 98 98  88  Resp: (!) 8 14  16   Temp: 98.7 F (37.1 C) 99.5 F (37.5 C)  98.1 F (36.7 C)  TempSrc: Oral Oral  Oral  SpO2: 100% 100% 95% 99%  Weight:  51.9 kg (114 lb 6.7 oz)    Height:        Intake/Output Summary (Last 24 hours) at 11/09/2017 1532 Last data filed at 11/09/2017 1456 Gross per 24 hour  Intake 2149.03 ml  Output 1975 ml  Net 174.03 ml   Filed Weights   11/04/17 0053 11/08/17 1100 11/09/17 0408  Weight: 49.3 kg (108 lb 11 oz) 49 kg (108 lb) 51.9 kg (114 lb 6.7 oz)    Examination:  General exam: Sitting up in chair.  NG tube in place.  Respiratory system: Lungs clear to auscultation anterior lung fields.  No rhonchi.  No wheezing.  Respiratory effort normal. Cardiovascular system: Regular rate rhythm no murmurs rubs or gallops.  No JVD.  2+ bilateral lower extremity edema.   Gastrointestinal system: Abdomen is soft, hypoactive bowel sounds, nondistended, wound VAC in place.  Minimal stool in colostomy.   Central nervous system: Alert and oriented. No focal neurological deficits. Extremities: 2+ bilateral lower extremity edema.  Symmetric 5 x 5 power. Skin: No rashes, lesions or ulcers Psychiatry: Judgement and insight appear normal. Mood & affect appropriate.     Data Reviewed: I have personally reviewed following labs and imaging studies  CBC: Recent Labs  Lab 11/03/17 1649 11/05/17 0414 11/06/17 0421 11/07/17 0417 11/08/17 0424 11/09/17 0112 11/09/17 0440  WBC 9.5 4.6 4.0 6.2 4.7  --  2.3*  NEUTROABS 8.0* 3.4 2.9 5.0 3.4  --   --   HGB 14.3 11.2* 10.9* 12.0 11.7* 10.2* 10.2*  HCT 41.0 33.4* 31.6* 35.5* 35.1* 30.4* 30.0*  MCV 83.7 84.6 83.8 85.5 85.2  --  86.2  PLT 419* 329 319 328 271  --  403   Basic Metabolic Panel: Recent Labs  Lab  11/04/17 0311  11/05/17 0414 11/06/17 0421 11/07/17 0417 11/08/17 0424 11/09/17 0440  NA  --    < > 129* 130*  130* 130*  130* 127* 129*  K  --    < > 3.9 3.6  3.6 4.0  4.0 3.9 3.9  CL  --    < > 100* 97*  96* 95*  94* 93* 100*  CO2  --    < > 20* 26  26 26  26 26 23   GLUCOSE  --    < > 84 100*  99 86  85 95 175*  BUN  --    < > 10 8  8 7  7 11  21*  CREATININE  --    < > 0.92 0.91  0.91 1.05*  0.97 1.02* 0.94  CALCIUM  --    < > 8.6* 8.2*  8.3* 8.9  8.9 8.8* 8.0*  MG 1.7  --   --   --  1.7 2.1 1.8  PHOS  --   --   --  2.4* 2.7 3.4 3.5   < > =  values in this interval not displayed.   GFR: Estimated Creatinine Clearance: 44.6 mL/min (by C-G formula based on SCr of 0.94 mg/dL). Liver Function Tests: Recent Labs  Lab 11/05/17 0414 11/06/17 0421 11/07/17 0417 11/08/17 0424 11/09/17 0440  AST 25 26 29 31 25   ALT 10* 10* 9* 13* 8*  ALKPHOS 53 48 57 64 37*  BILITOT 0.5 0.4 0.6 0.3 0.1*  PROT 4.8* 4.4* 5.2* 5.2* 3.8*  ALBUMIN 2.7* 2.4*  2.5* 2.8*  2.8* 2.9* 2.3*   Recent Labs  Lab 11/03/17 1828  LIPASE 37   No results for input(s): AMMONIA in the last 168 hours. Coagulation Profile: No results for input(s): INR, PROTIME in the last 168 hours. Cardiac Enzymes: Recent Labs  Lab 11/08/17 2351  TROPONINI <0.03   BNP (last 3 results) No results for input(s): PROBNP in the last 8760 hours. HbA1C: No results for input(s): HGBA1C in the last 72 hours. CBG: Recent Labs  Lab 11/08/17 2036 11/08/17 2335 11/09/17 0405 11/09/17 0730 11/09/17 1200  GLUCAP 168* 192* 178* 144* 119*   Lipid Profile: Recent Labs    11/08/17 0424  TRIG 152*   Thyroid Function Tests: Recent Labs    11/08/17 0930  TSH 3.123   Anemia Panel: No results for input(s): VITAMINB12, FOLATE, FERRITIN, TIBC, IRON, RETICCTPCT in the last 72 hours. Sepsis Labs: Recent Labs  Lab 11/03/17 1706 11/03/17 1831  LATICACIDVEN 1.87 0.58    Recent Results (from the past 240  hour(s))  Urine culture     Status: Abnormal   Collection Time: 11/03/17  4:49 PM  Result Value Ref Range Status   Specimen Description   Final    URINE, CLEAN CATCH Performed at [facility] [address], [place], [place] [address]    Special Requests   Final    NONE Performed at [facility], [place] [address] [place], [place] [address]    Culture MULTIPLE SPECIES PRESENT, SUGGEST RECOLLECTION (A)  Final   Report Status 11/04/2017 FINAL  Final  MRSA PCR Screening     Status: None   Collection Time: 11/07/17  2:43 PM  Result Value Ref Range Status   MRSA by PCR NEGATIVE NEGATIVE Final    Comment:        The GeneXpert MRSA Assay (FDA approved for NASAL specimens only), is one component of a comprehensive MRSA colonization surveillance program. It is not intended to diagnose MRSA infection nor to guide or monitor treatment for MRSA infections. Performed at [facility], [place] [address], [place], [place] [address]   Culture, Urine     Status: Abnormal   Collection Time: 11/08/17  9:54 AM  Result Value Ref Range Status   Specimen Description   Final    URINE, CLEAN CATCH Performed at [facility], [place] [address] [place], [place] [address]    Special Requests   Final    NONE Performed at [facility], [place] [address], [place], [place] [address]    Culture (A)  Final    <10,000 COLONIES/mL INSIGNIFICANT GROWTH Performed at [facility] [address] [place], [place] [address]    Report Status 11/09/2017 FINAL  Final         Radiology Studies: No results found.      Scheduled Meds: . enoxaparin (LOVENOX) injection  40 mg Subcutaneous QHS  . furosemide  20 mg Intravenous Q12H  . insulin aspart  0-9 Units Subcutaneous Q6H  .  levothyroxine  50 mcg Intravenous Daily  . lip balm  1 application Topical BID  . LORazepam  0.5 mg Intravenous Q24H   Continuous  Infusions: . Marland KitchenTPN (CLINIMIX-E) Adult     And  . Fat emulsion    . albumin human    . sodium chloride    . Marland KitchenTPN (CLINIMIX-E) Adult 60 mL/hr at 11/08/17 1820     LOS: 6 days    Time spent: 37 minutes    Irine Seal, MD [facility] Pager [phone]  If 7PM-7AM, please contact night-coverage [email or web address] Password [facility] 11/09/2017, 3:32 PM

## 2017-11-09 NOTE — Progress Notes (Addendum)
Follow-up: Notified by RN at approx 2115 that RN noted possible change in rhythm on telemetry so obtained 12-lead EKG. Ask that I review EKG. EKG reveals ST w/ rate of 108. HR has been running 80's 90's. Other VSS and pt remains afebrile. LVH w/ repol abnormality c/w previous EKG's. Current EKG however reveals A LBBB not noted on previous EKG's. Pt has no c/o CP. Assessment/Plan: 1. New LBBB: No CP or other cardiopulmonary symptoms. VSS. Discussed pt w/ Dr Hal Hope who has also reviewed EKG. He recommended adding troponin x 1 and repeating EKG when rate < 100 to see if new LBBB still present. Will place orders for same and continue to monitor closely on telemetry.  Jeryl Columbia, NP-C [facility] Pager [phone]   11/09/2017 @ 0015: Mild tachycardia persisted for several hours so pt given small IVFB and small dose of IV Lopressor. Rate in the 90's. EKG repeated and still reveals new LBBB. Pt remains asymptomatic. Troponin pending.

## 2017-11-10 ENCOUNTER — Inpatient Hospital Stay (HOSPITAL_COMMUNITY): Payer: 59

## 2017-11-10 ENCOUNTER — Inpatient Hospital Stay (HOSPITAL_BASED_OUTPATIENT_CLINIC_OR_DEPARTMENT_OTHER): Payer: 59 | Admitting: Oncology

## 2017-11-10 ENCOUNTER — Other Ambulatory Visit: Payer: Self-pay | Admitting: Oncology

## 2017-11-10 ENCOUNTER — Encounter: Payer: 59 | Admitting: Physical Therapy

## 2017-11-10 DIAGNOSIS — C7951 Secondary malignant neoplasm of bone: Secondary | ICD-10-CM

## 2017-11-10 DIAGNOSIS — I447 Left bundle-branch block, unspecified: Secondary | ICD-10-CM

## 2017-11-10 DIAGNOSIS — C786 Secondary malignant neoplasm of retroperitoneum and peritoneum: Secondary | ICD-10-CM

## 2017-11-10 DIAGNOSIS — C50312 Malignant neoplasm of lower-inner quadrant of left female breast: Secondary | ICD-10-CM

## 2017-11-10 DIAGNOSIS — C785 Secondary malignant neoplasm of large intestine and rectum: Secondary | ICD-10-CM

## 2017-11-10 DIAGNOSIS — C801 Malignant (primary) neoplasm, unspecified: Secondary | ICD-10-CM

## 2017-11-10 DIAGNOSIS — R9431 Abnormal electrocardiogram [ECG] [EKG]: Secondary | ICD-10-CM

## 2017-11-10 DIAGNOSIS — Z90722 Acquired absence of ovaries, bilateral: Secondary | ICD-10-CM

## 2017-11-10 DIAGNOSIS — R64 Cachexia: Secondary | ICD-10-CM

## 2017-11-10 DIAGNOSIS — C784 Secondary malignant neoplasm of small intestine: Secondary | ICD-10-CM

## 2017-11-10 LAB — CBC WITH DIFFERENTIAL/PLATELET
BASOS ABS: 0 10*3/uL (ref 0.0–0.1)
Basophils Relative: 0 %
Eosinophils Absolute: 0 10*3/uL (ref 0.0–0.7)
Eosinophils Relative: 0 %
HEMATOCRIT: 27.4 % — AB (ref 36.0–46.0)
Hemoglobin: 9.3 g/dL — ABNORMAL LOW (ref 12.0–15.0)
LYMPHS ABS: 0.4 10*3/uL — AB (ref 0.7–4.0)
LYMPHS PCT: 5 %
MCH: 28.5 pg (ref 26.0–34.0)
MCHC: 33.9 g/dL (ref 30.0–36.0)
MCV: 84 fL (ref 78.0–100.0)
MONO ABS: 0.4 10*3/uL (ref 0.1–1.0)
Monocytes Relative: 5 %
NEUTROS ABS: 7 10*3/uL (ref 1.7–7.7)
Neutrophils Relative %: 90 %
Platelets: 199 10*3/uL (ref 150–400)
RBC: 3.26 MIL/uL — ABNORMAL LOW (ref 3.87–5.11)
RDW: 13.8 % (ref 11.5–15.5)
WBC: 7.7 10*3/uL (ref 4.0–10.5)

## 2017-11-10 LAB — ECHOCARDIOGRAM COMPLETE
Height: 60 in
WEIGHTICAEL: 1840 [oz_av]

## 2017-11-10 LAB — BASIC METABOLIC PANEL
Anion gap: 5 (ref 5–15)
BUN: 28 mg/dL — ABNORMAL HIGH (ref 6–20)
CHLORIDE: 96 mmol/L — AB (ref 101–111)
CO2: 26 mmol/L (ref 22–32)
CREATININE: 0.89 mg/dL (ref 0.44–1.00)
Calcium: 8.4 mg/dL — ABNORMAL LOW (ref 8.9–10.3)
GFR calc non Af Amer: >60 mL/min (ref >60)
Glucose, Bld: 108 mg/dL — ABNORMAL HIGH (ref 65–99)
POTASSIUM: 3.7 mmol/L (ref 3.5–5.1)
Sodium: 127 mmol/L — ABNORMAL LOW (ref 135–145)

## 2017-11-10 LAB — GLUCOSE, CAPILLARY
GLUCOSE-CAPILLARY: 122 mg/dL — AB (ref 65–99)
Glucose-Capillary: 124 mg/dL — ABNORMAL HIGH (ref 65–99)
Glucose-Capillary: 127 mg/dL — ABNORMAL HIGH (ref 65–99)
Glucose-Capillary: 90 mg/dL (ref 65–99)

## 2017-11-10 LAB — PHOSPHORUS: PHOSPHORUS: 3 mg/dL (ref 2.5–4.6)

## 2017-11-10 LAB — MAGNESIUM: Magnesium: 2.1 mg/dL (ref 1.7–2.4)

## 2017-11-10 MED ORDER — FAT EMULSION PLANT BASED 20 % IV EMUL
240.0000 mL | INTRAVENOUS | Status: AC
  Administered 2017-11-10: 240 mL via INTRAVENOUS
  Filled 2017-11-10: qty 250

## 2017-11-10 MED ORDER — TRACE MINERALS CR-CU-MN-SE-ZN 10-1000-500-60 MCG/ML IV SOLN
INTRAVENOUS | Status: AC
  Administered 2017-11-10: 18:00:00 via INTRAVENOUS
  Filled 2017-11-10: qty 1800

## 2017-11-10 MED ORDER — ACETAMINOPHEN 10 MG/ML IV SOLN
1000.0000 mg | Freq: Four times a day (QID) | INTRAVENOUS | Status: AC
  Administered 2017-11-10 – 2017-11-11 (×4): 1000 mg via INTRAVENOUS
  Filled 2017-11-10 (×4): qty 100

## 2017-11-10 MED ORDER — FUROSEMIDE 10 MG/ML IJ SOLN
40.0000 mg | Freq: Two times a day (BID) | INTRAMUSCULAR | Status: AC
  Administered 2017-11-10 – 2017-11-11 (×2): 40 mg via INTRAVENOUS
  Filled 2017-11-10 (×3): qty 4

## 2017-11-10 MED ORDER — METHOCARBAMOL 1000 MG/10ML IJ SOLN
500.0000 mg | Freq: Three times a day (TID) | INTRAMUSCULAR | Status: DC
  Administered 2017-11-10 – 2017-11-16 (×18): 500 mg via INTRAVENOUS
  Filled 2017-11-10 (×8): qty 550
  Filled 2017-11-10: qty 5
  Filled 2017-11-10: qty 550
  Filled 2017-11-10: qty 5
  Filled 2017-11-10 (×8): qty 550

## 2017-11-10 MED ORDER — FAMOTIDINE IN NACL 20-0.9 MG/50ML-% IV SOLN
20.0000 mg | INTRAVENOUS | Status: DC
  Administered 2017-11-10 – 2017-11-12 (×3): 20 mg via INTRAVENOUS
  Filled 2017-11-10 (×3): qty 50

## 2017-11-10 MED ORDER — POTASSIUM CHLORIDE 10 MEQ/100ML IV SOLN
10.0000 meq | INTRAVENOUS | Status: AC
  Administered 2017-11-10 (×2): 10 meq via INTRAVENOUS
  Filled 2017-11-10 (×2): qty 100

## 2017-11-10 NOTE — Progress Notes (Signed)
Sonia Hill   DOB:05-16-1956   ZO#:[id number]   [id number]#:[id number]  Subjective:  Sonia Hill is moderately comfortable in bed, worried about narcotics and constipation, sitting up in recliner several hours yesterday and taking 20-step walks despite being tethered to IVs etc.; thinking ahead to trips to [place], requesting a wc, etc.--overall very positive outlook; taking small sips and ice chips, hoping to advance diet son; sister in room   Objective: middle aged White woman examined in bed Vitals:   11/10/17 0112 11/10/17 0638  BP: 105/67 123/67  Pulse: 100 (!) 123  Resp:  17  Temp:  98.9 F (37.2 C)  SpO2:  97%    Body mass index is 22.46 kg/m.  Intake/Output Summary (Last 24 hours) at 11/10/2017 0759 Last data filed at 11/10/2017 0700 Gross per 24 hour  Intake 1864.5 ml  Output 1395 ml  Net 469.5 ml     Sclerae unicteric  Oropharynx clear  Lungs no rales or wheezes--auscultated anterolaterally  Heart regular rate and rhythm  Abdomen soft, no BS, small amount of brown material and liquid in colostomy bag, PEG in place, wound vac in place; no Foley  Neuro nonfocal  Breast exam: deferred  CBG (last 3)  Recent Labs    11/09/17 1200 11/09/17 1654 11/10/17 0058  GLUCAP 119* 123* 124*     Labs:  Lab Results  Component Value Date   WBC 7.7 11/10/2017   HGB 9.3 (L) 11/10/2017   HCT 27.4 (L) 11/10/2017   MCV 84.0 11/10/2017   PLT 199 11/10/2017   NEUTROABS 7.0 11/10/2017    '@LASTCHEMISTRY' @  Urine Studies No results for input(s): UHGB, CRYS in the last 72 hours.  Invalid input(s): UACOL, UAPR, USPG, UPH, UTP, UGL, UKET, UBIL, UNIT, UROB, [place], UEPI, UWBC, [place], [place], [place], [place], [place]  Basic Metabolic Panel: Recent Labs  Lab 11/04/17 [phone]  11/06/17 0421 11/07/17 0417 11/08/17 0424 11/09/17 0440 11/10/17 0442  NA  --    < > 130*  130* 130*  130* 127* 129* 127*  K  --    < > 3.6  3.6 4.0  4.0 3.9 3.9 3.7  CL  --    < > 97*  96* 95*  94* 93* 100*  96*  CO2  --    < > '26  26 26  26 26 23 26  ' GLUCOSE  --    < > 100*  99 86  85 95 175* 108*  BUN  --    < > '8  8 7  7 11 ' 21* 28*  CREATININE  --    < > 0.91  0.91 1.05*  0.97 1.02* 0.94 0.89  CALCIUM  --    < > 8.2*  8.3* 8.9  8.9 8.8* 8.0* 8.4*  MG 1.7  --   --  1.7 2.1 1.8 2.1  PHOS  --   --  2.4* 2.7 3.4 3.5 3.0   < > = values in this interval not displayed.   GFR Estimated Creatinine Clearance: 47.1 mL/min (by C-G formula based on SCr of 0.89 mg/dL). Liver Function Tests: Recent Labs  Lab 11/05/17 0414 11/06/17 0421 11/07/17 0417 11/08/17 0424 11/09/17 0440  AST '25 26 29 31 25  ' ALT 10* 10* 9* 13* 8*  ALKPHOS 53 48 57 64 37*  BILITOT 0.5 0.4 0.6 0.3 0.1*  PROT 4.8* 4.4* 5.2* 5.2* 3.8*  ALBUMIN 2.7* 2.4*  2.5* 2.8*  2.8* 2.9* 2.3*   Recent Labs  Lab 11/03/17  1828  LIPASE 37   No results for input(s): AMMONIA in the last 168 hours. Coagulation profile No results for input(s): INR, PROTIME in the last 168 hours.  CBC: Recent Labs  Lab 11/05/17 0414 11/06/17 0421 11/07/17 0417 11/08/17 0424 11/09/17 0112 11/09/17 0440 11/10/17 0442  WBC 4.6 4.0 6.2 4.7  --  2.3* 7.7  NEUTROABS 3.4 2.9 5.0 3.4  --   --  7.0  HGB 11.2* 10.9* 12.0 11.7* 10.2* 10.2* 9.3*  HCT 33.4* 31.6* 35.5* 35.1* 30.4* 30.0* 27.4*  MCV 84.6 83.8 85.5 85.2  --  86.2 84.0  PLT 329 319 328 271  --  206 199   Cardiac Enzymes: Recent Labs  Lab 11/08/17 2351  TROPONINI <0.03   BNP: Invalid input(s): POCBNP CBG: Recent Labs  Lab 11/09/17 0405 11/09/17 0730 11/09/17 1200 11/09/17 1654 11/10/17 0058  GLUCAP 178* 144* 119* 123* 124*   D-Dimer No results for input(s): DDIMER in the last 72 hours. Hgb A1c No results for input(s): HGBA1C in the last 72 hours. Lipid Profile Recent Labs    11/08/17 0424  TRIG 152*   Thyroid function studies Recent Labs    11/08/17 0930  TSH 3.123   Anemia work up No results for input(s): VITAMINB12, FOLATE, FERRITIN, TIBC, IRON,  RETICCTPCT in the last 72 hours. Microbiology Recent Results (from the past 240 hour(s))  Urine culture     Status: Abnormal   Collection Time: 11/03/17  4:49 PM  Result Value Ref Range Status   Specimen Description   Final    URINE, CLEAN CATCH Performed at [facility], [place] [address], [place], [place] [address]    Special Requests   Final    NONE Performed at [facility], [place] [address] [place], [place] [address]    Culture MULTIPLE SPECIES PRESENT, SUGGEST RECOLLECTION (A)  Final   Report Status 11/04/2017 FINAL  Final  MRSA PCR Screening     Status: None   Collection Time: 11/07/17  2:43 PM  Result Value Ref Range Status   MRSA by PCR NEGATIVE NEGATIVE Final    Comment:        The GeneXpert MRSA Assay (FDA approved for NASAL specimens only), is one component of a comprehensive MRSA colonization surveillance program. It is not intended to diagnose MRSA infection nor to guide or monitor treatment for MRSA infections. Performed at [facility], [place] [address] [place], [place] [address]   Culture, Urine     Status: Abnormal   Collection Time: 11/08/17  9:54 AM  Result Value Ref Range Status   Specimen Description   Final    URINE, CLEAN CATCH Performed at [facility], [place] [address] [place], [place] [address]    Special Requests   Final    NONE Performed at [facility], [place] [address], [place], [place] [address]    Culture (A)  Final    <10,000 COLONIES/mL INSIGNIFICANT GROWTH Performed at [place] [address], [place], [place] [address]    Report Status 11/09/2017 FINAL  Final      Studies:  Ct Abdomen Pelvis Wo Contrast  Result Date: 11/03/2017 CLINICAL DATA:  Abdominal distension with decreased ostomy output EXAM: CT ABDOMEN AND PELVIS WITHOUT CONTRAST TECHNIQUE: Multidetector CT imaging of the abdomen and pelvis was performed following the  standard protocol without IV contrast. COMPARISON:  CT 07/10/2017, 05/22/2017, radiograph 11/02/2017 FINDINGS: Lower chest: Lung bases demonstrate small bilateral pleural  effusions. No consolidation. Normal heart size. Hepatobiliary: No focal liver abnormality is seen. No gallstones, gallbladder wall thickening, or biliary dilatation. Pancreas: Unremarkable. No pancreatic ductal dilatation or surrounding inflammatory changes. Spleen: Normal in size without focal abnormality. Adrenals/Urinary Tract: Right adrenal gland is normal. Mild to moderate right hydronephrosis and hydroureter as before. Absent left kidney. Bladder within normal limits. Stomach/Bowel: Mild-to-moderate fluid distention of stomach. Multiple dilated loops of proximal to mid small bowel, measuring up to 4.5 cm. Abduct transition to decompressed distal small bowel in the right mid to lower abdomen, series 2, image number 43, with minimal contrast within decompressed small bowel loops distal to this. No appreciable contrast in the colon. Status post left lower quadrant double-barrel colostomy. Heterogeneous soft tissue thickening at the anus and rectum, corresponding to previously noted mass. Vascular/Lymphatic: Nonaneurysmal aorta. Limited evaluation for adenopathy due to diffuse abdominal fluid. Reproductive: No adnexal mass. Uterus inseparable from soft tissue mass at the rectum and anus. Other: No free air. Increased abdominal and pelvic ascites, now moderate. Diffuse subcutaneous edema. Musculoskeletal: No acute or suspicious abnormality. IMPRESSION: 1. Multiple dilated loops of small bowel with abduct transition to decompressed distal small bowel in the mid to lower abdomen slightly to the right of midline, consistent with small bowel obstruction. A small amount of contrast does pass into decompressed small bowel distal to the point of obstruction. 2. Interim development of small pleural effusions. Development of moderate abdominopelvic  ascites. Diffuse subcutaneous edema consistent with anasarca 3. Status post left lower quadrant colostomy. Soft tissue enlargement of the anus and rectum, corresponding to previously demonstrated infiltrative mass in the region. 4. Similar mild to moderate right hydronephrosis and slightly dilated right ureter. Electronically Signed   By: Donavan Foil M.D.   On: 11/03/2017 21:41   Dg Abd 1 View  Result Date: 11/03/2017 CLINICAL DATA:  Small-bowel obstruction EXAM: ABDOMEN - 1 VIEW COMPARISON:  07/14/2017 FINDINGS: Moderate small bowel dilatation. Colon decompressed. Left lower quadrant ostomy. No renal calculi. Normal skeletal structures. IMPRESSION: Small-bowel obstruction. Electronically Signed   By: Franchot Gallo M.D.   On: 11/03/2017 08:44   US Renal  Result Date: 10/20/2017 CLINICAL DATA:  62 year old female with a history of hydronephrosis. Left-sided nephrectomy EXAM: RENAL / URINARY TRACT ULTRASOUND COMPLETE COMPARISON:  CT 07/10/2017, MR 10/05/2017 FINDINGS: Right Kidney: Length: 11.8 cm. Hydronephrosis. No significant renal cortical thinning. Echogenicity is similar to that of the adjacent liver parenchyma. Left Kidney: Left nephrectomy Bladder: Urinary bladder is decompressed. IMPRESSION: Right-sided hydronephrosis. Left nephrectomy. Electronically Signed   By: Corrie Mckusick D.O.   On: 10/20/2017 09:33   Dg Abd 2 Views  Result Date: 11/07/2017 CLINICAL DATA:  62 year old female with small bowel obstruction. Recent colonic surgery. Subsequent encounter. EXAM: ABDOMEN - 2 VIEW COMPARISON:  11/05/2017 plain film examination.  11/03/2017 CT. FINDINGS: Gas and fluid distended dilated small bowel loops more prominent than on the recent plain film examination with small bowel measuring up to 4.7 cm versus prior 4.5 cm. Small bowel fold thickening. Air-fluid levels. Interval development of gas filled stomach. Minimal residual contrast within colon. Colostomy left lower quadrant. No free  intraperitoneal air noted. IMPRESSION: Progressive partial small bowel obstructive pattern. Interval development of gas-filled stomach. No free intraperitoneal air. These results will be called to the ordering clinician or representative by the Radiologist Assistant, and communication documented in the PACS or zVision Dashboard. Electronically Signed   By: Genia Del M.D.   On: 11/07/2017 09:05   Dg Abd 2 Views  Result Date: 11/05/2017 CLINICAL DATA:  Per order- small bowel obstruction PT HX: GERD, bladder sugery EXAM: ABDOMEN - 2 VIEW COMPARISON:  CT 11/03/2017 and previous FINDINGS: Visualized lung bases clear.  No free air. Multiple dilated small bowel loops with fluid levels on the erect radiograph. Similar number of involved loops and degree of dilatation since previous radiograph 11/02/2017. There has been progression of oral contrast material into the proximal colon which is nondilated. There is a left lower quadrant ostomy device. No abnormal abdominal calcifications.  Regional bones unremarkable. IMPRESSION: 1. Findings consistent with persistent partial mid/distal small bowel obstruction. 2. No free air. Electronically Signed   By: Lucrezia Europe M.D.   On: 11/05/2017 12:23   [place] Ekg Site Rite  Result Date: 11/04/2017 If Site Rite image not attached, placement could not be confirmed due to current cardiac rhythm.   Assessment: 62 y.o.  BRCA 1-2 negative [facility] woman with lobular breast cancer stage IV at presentation July 2011, admitted with recurren SBO in the setting of abdominal carcinomatosis  (1) status post left mastectomy and sentinel lymph node sampling in July 2011 for a lower inner quadrant T1 N1 M1, stage IV invasive lobular breast cancer, grade 1, strongly estrogen and progesterone receptor-positive, HER2 negative with MIB-1 of 9% and no HER2 amplification,   (2) with multiple sclerotic bone lesions at presentation seen only on CT scan (not on bone scan or PET scan), but  with  biopsy-proven metastatic disease to bone; and an elevated CA 27.29 at presentation,   (3) Oncotype recurrence score of 4, predicts a good response to antiestrogens.  (4) Systemic treatment has consisted of             a) tamoxifen with evidence of response but poor tolerance             b) letrozole starting August 2012, discontinued October 2014 per patient              (5) single functioning kidney  (6) status post bilateral salpingo-oophorectomy 01/24/2013, with benign pathology  (7) osteoporosis; the patient refuses bisphosphonate therapy; started [facility] February 2014.               (a) bone density was obtained under the care of Dr. Matthew Saras at [facility], 06/25/2012, showing osteoporosis with a T score -2.6. This was repeated 12/20/2013, showing again osteoporosis with T-scores between -2.4 and -2.8.             (b) the patient refuses zolendronate, denosumab, or other pharmacologic intervention             (c) bone density on 12/30/2015 under Dr. Matthew Saras showing osteoporosis with T score -3.3  (8) the patient refuses standard Mammography or tomography; undergoing thermography screening of the right breast.  (9) PET scan 06/05/2015 shows rectal thickening and a presacral mass; biopsy of this area 10/09/2015 confirms metastatic lobular breast cancer, again estrogen receptor positive, HER-2 nonamplified.             (a) pelvic MRI 03/11/2016 confirms stability of disease             (b) chest CT and pelvic MRI 09/02/2016 shows no change in the circumferential rectal thickening or evidence of extension beyond the serosa and chest CT findings             (c) colonoscopy 02/23/2017 showed a very narrow rectal lumen with a few apparently uninvolved centimeters distally              (  d) CT of the abdomen and pelvis 05/22/2017 (after diverting colostomy) shows interval increase in the size of the perirectal soft tissue masses.  (10) started fulvestrant and  palbociclib 125 mg/ day [21/7] May 2017             (a) palbociclib dose decreased to 100 mg daily [21/7] with second cycle, started 11/25/2015             (b) palbociclib discontinued 03/20/2017 with evidence of disease progression             (c) last fulvestrant dose 03/24/2017, discontinued with evidence of progression  (11) status post colostomy placement [facility] November 2018  (12) started exemestane 07/05/2017,             (a) everolimus 5 mg/d started 07/14/2017--held as of 08/20/2017 secondary to rash             (b) exemestane stopped by patient 09/04/2017 to try "the natural way".  (13) exemestane resumed 10/16/2017  (a) abemaciclib/Verzenio added 10/18/2017  (14) s/p laparoscopic small bowel resection for SBO 11/08/2017    Plan:  Sonia Hill appears to have tolerated her surgery well and is very encouraged that she might be able to resume a less restricted diet. We will resume her exemestane/ abemaciclib when she can take full liquids and note aromatase inhibitors do not affect risk of clotting.  I have alerted pathology to the question whether the stricture was malignant or scar or a combination. Note lobular breast cancer is infiltrative and can be difficult to detect by palpation/inspection--general does not form easily detectable masses as ductal cancers do.  Sonia Hill has her own ideas about nutrition-- it will be important for our nutritionist to work closely and extensively with her as her diet starts to open up.  I am very appreciative of your excellent care to this patient and her family!     Chauncey Cruel, MD 11/10/2017  7:59 AM Medical Oncology and Hematology [facility] [address] [place], [place] [address] Tel. [phone]    Fax. [phone]

## 2017-11-10 NOTE — Progress Notes (Signed)
  Echocardiogram 2D Echocardiogram has been performed.  Sonia Hill 11/10/2017, 8:53 AM

## 2017-11-10 NOTE — Consult Note (Signed)
[facility] Nurse ostomy follow up Stoma type/location: LUQ colostomy.  POuch change.  Cut off center to accommodate NPWT dressing.   Stomal assessment/size: 1 1/4" pink and moist Peristomal assessment: erythema Treatment options for stomal/peristomal skin: barrier ring Output soft brown stool Ostomy pouching: 2pc.2 1/4" pouch with barrier ring  Education provided: none needed.  Emotional support provided due to pain Enrolled patient in [employer] Start Discharge program: No  WOC Nurse wound consult note Reason for Consult:midline abdominal wound with NPWT VAC dressing change Wound type:surgical Pressure Injury POA: NA Measurement: 4 cm x 2.1 cm x 3 cm  Wound FHL:KTGYB red Drainage (amount, consistency, odor) minimal bleeding today Periwound:ostomy, PEG tube Dressing procedure/placement/frequency:1 piece black foam WOC team will follow. Domenic Moras RN BSN [place] Pager [phone]

## 2017-11-10 NOTE — Progress Notes (Addendum)
PROGRESS NOTE    Sonia Hill  ZOX:[id number] DOB: [birth year] DOA: 11/03/2017 PCP: Sonia Elbe, MD    Brief Narrative:  36 female-metastatic breast cancer (magrinat)-on Verzenio Colostomy + rectal mass 04/2017-complicated by SBO-recurrent SBO 06/2017 and currently on liquid diet at home  Admit severe excruciating abdominal pain no ostomy output CT = worsening cancer-blood pressures stable Main lab abnormalities potassium 2.5 calcium 5.6 glucose 55 treated initially No NG tube-General surgery and oncology have consulted on the patient It appears that patient may be going for exploratory surgery 5/28 and is n.p.o. 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  Patient subsequently underwent laparoscopic diagnostic small bowel resection, insertion of gastrostomy tube, lysis of adhesion, partial colectomy with new colostomy per Sonia Hill 11/08/2017.  NG tube is been discontinued per general surgery.  Follow.   #2 hyponatremia Likely secondary to hypervolemic hyponatremia plus or minus SIADH.  Patient noted to have lower extremity edema as well as low albumin levels.  Patient given a dose of IV Lasix.  Patient with a urine output of 1.075 L over the past 24 hours.   TSH within normal limits.  Increase Lasix to 40 mg IV every 12 hours.   3.  Hypokalemia/hypocalcemia/hypomagnesemia Continue TPN.  Electrolytes being repleted per pharmacy.    4.  Hypothyroidism  TSH 3.123.  IV Synthroid.  Once patient has been started on a diet and tolerating will transition to oral Synthroid.    5.  Metastatic estrogen positive lobular breast cancer Patient with metastatic spread to the spine and rectal area resulting in blockage and diverging colostomy.  Per oncology.   6.  Solitary kidney hydronephrosis Status post Foley catheter placement.  Renal function stable.  Outpatient follow-up.  7.  Anxiety Ativan as needed.  8 severe protein calorie malnutrition On TPN.  9.  Volume overload Patient with lower extremity edema.  Likely component of hypoalbuminemia.  Current weight is 115 pounds from 114 pounds from 108 pounds on 11/04/2017.  Blood pressure somewhat borderline.  Increase Lasix to 40 mg IV every 12 hours.  Strict I's and O's.  Daily weights.  Follow.    10.  New left bundle branch block Noted on EKG the night of 11/08/2017.  Patient denies any chest pain no shortness of breath.  2D echo ordered and pending.  Troponin I was negative x1.  Cardiology consultation pending for further evaluation and management.      DVT prophylaxis: Lovenox. Code Status: Full Family Communication: Updated patient and family at bedside. Disposition Plan: Per general surgery.   Consultants:   General Surgery: [name] 11/04/2017  Palliative care: Dr. Rowe Pavy 10/30/2017  Procedures:   CT abdomen and pelvis 11/03/2017  Abdominal films 11/07/2017, 11/05/2017, 11/02/2017  Laparoscopic diagnostic small bowel resection, insertion of gastrostomy tube, lysis of  adhesion, partial colectomy with new colostomy per Sonia Hill 11/08/2017  2D echo 11/10/2017  Antimicrobials:   None   Subjective: Patient just received some pain medication and sleepy however easily arousable and  answering questions.  Denies any chest pain.  No shortness of breath.  Still with some lower extremity edema.    Objective: Vitals:   11/09/17 2129 11/10/17 0112 11/10/17 0500 11/10/17 0638  BP: 118/62 105/67  123/67  Pulse: (!) 107 100  (!) 123  Resp: 18   17  Temp: 99 F (37.2 C)   98.9 F (37.2 C)  TempSrc: Oral   Oral  SpO2: 97%   97%  Weight:   52.2 kg (115 lb)   Height:        Intake/Output Summary (Last 24 hours) at 11/10/2017 1232 Last data filed at 11/10/2017 0700 Gross per 24 hour  Intake 1624.5 ml  Output 1145 ml  Net 479.5 ml   Filed Weights   11/08/17 1100 11/09/17 0408 11/10/17 0500  Weight: 49 kg (108 lb) 51.9 kg (114 lb 6.7 oz) 52.2 kg (115 lb)    Examination:  General exam: In bed.  Respiratory system: CTAB anterior lung fields.  No wheezing, no rhonchi.  Respiratory effort normal. Cardiovascular system: RRR no murmurs rubs or gallops.  2+ bilateral lower extremity edema.  Gastrointestinal system: Abdomen is soft, hypoactive bowel sounds, nondistended, wound VAC in place.  Colostomy intact.     Central nervous system: Alert and oriented. No focal neurological deficits. Extremities: 2+ bilateral lower extremity edema.  Symmetric 5 x 5 power. Skin: No rashes, lesions or ulcers Psychiatry: Judgement and insight appear normal. Mood & affect appropriate.     Data Reviewed: I have personally reviewed following labs and imaging studies  CBC: Recent Labs  Lab 11/05/17 0414 11/06/17 0421 11/07/17 0417 11/08/17 0424 11/09/17 0112 11/09/17 0440 11/10/17 0442  WBC 4.6 4.0 6.2 4.7  --  2.3* 7.7  NEUTROABS 3.4 2.9 5.0 3.4  --   --  7.0  HGB 11.2* 10.9* 12.0 11.7* 10.2* 10.2* 9.3*  HCT 33.4* 31.6* 35.5* 35.1* 30.4* 30.0* 27.4*  MCV 84.6 83.8 85.5 85.2  --  86.2 84.0  PLT 329 319 328 271  --  206 834   Basic Metabolic Panel: Recent Labs  Lab 11/04/17 0311  11/06/17 0421 11/07/17 0417 11/08/17 0424 11/09/17 0440 11/10/17 0442  NA  --    < > 130*   130* 130*  130* 127* 129* 127*  K  --    < > 3.6  3.6 4.0  4.0 3.9 3.9 3.7  CL  --    < > 97*  96* 95*  94* 93* 100* 96*  CO2  --    < > 26  26 26  26 26 23 26   GLUCOSE  --    < > 100*  99 86  85 95 175* 108*  BUN  --    < > 8  8 7  7 11  21* 28*  CREATININE  --    < > 0.91  0.91 1.05*  0.97 1.02* 0.94 0.89  CALCIUM  --    < > 8.2*  8.3* 8.9  8.9 8.8* 8.0* 8.4*  MG 1.7  --   --  1.7 2.1 1.8 2.1  PHOS  --   --  2.4* 2.7 3.4 3.5 3.0   < > = values in this interval not displayed.   GFR: Estimated Creatinine Clearance: 47.1 mL/min (by C-G  formula based on SCr of 0.89 mg/dL). Liver Function Tests: Recent Labs  Lab 11/05/17 0414 11/06/17 0421 11/07/17 0417 11/08/17 0424 11/09/17 0440  AST 25 26 29 31 25   ALT 10* 10* 9* 13* 8*  ALKPHOS 53 48 57 64 37*  BILITOT 0.5 0.4 0.6 0.3 0.1*  PROT 4.8* 4.4* 5.2* 5.2* 3.8*  ALBUMIN 2.7* 2.4*  2.5* 2.8*  2.8* 2.9* 2.3*   Recent Labs  Lab 11/03/17 1828  LIPASE 37   No results for input(s): AMMONIA in the last 168 hours. Coagulation Profile: No results for input(s): INR, PROTIME in the last 168 hours. Cardiac Enzymes: Recent Labs  Lab 11/08/17 2351  TROPONINI <0.03   BNP (last 3 results) No results for input(s): PROBNP in the last 8760 hours. HbA1C: No results for input(s): HGBA1C in the last 72 hours. CBG: Recent Labs  Lab 11/09/17 1200 11/09/17 1654 11/10/17 0058 11/10/17 0634 11/10/17 1155  GLUCAP 119* 123* 124* 122* 127*   Lipid Profile: Recent Labs    11/08/17 0424  TRIG 152*   Thyroid Function Tests: Recent Labs    11/08/17 0930  TSH 3.123   Anemia Panel: No results for input(s): VITAMINB12, FOLATE, FERRITIN, TIBC, IRON, RETICCTPCT in the last 72 hours. Sepsis Labs: Recent Labs  Lab 11/03/17 1706 11/03/17 1831  LATICACIDVEN 1.87 0.58    Recent Results (from the past 240 hour(s))  Urine culture     Status: Abnormal   Collection Time: 11/03/17  4:49 PM  Result Value Ref Range Status    Specimen Description   Final    URINE, CLEAN CATCH Performed at [facility] [address], [place], [place] [address]    Special Requests   Final    NONE Performed at [facility], [place] [address] [place], [place] [address]    Culture MULTIPLE SPECIES PRESENT, SUGGEST RECOLLECTION (A)  Final   Report Status 11/04/2017 FINAL  Final  MRSA PCR Screening     Status: None   Collection Time: 11/07/17  2:43 PM  Result Value Ref Range Status   MRSA by PCR NEGATIVE NEGATIVE Final    Comment:        The GeneXpert MRSA Assay (FDA approved for NASAL specimens only), is one component of a comprehensive MRSA colonization surveillance program. It is not intended to diagnose MRSA infection nor to guide or monitor treatment for MRSA infections. Performed at [facility], [place] [address], [place], [place] [address]   Culture, Urine     Status: Abnormal   Collection Time: 11/08/17  9:54 AM  Result Value Ref Range Status   Specimen Description   Final    URINE, CLEAN CATCH Performed at [facility], [place] [address] [place], [place] [address]    Special Requests   Final    NONE Performed at [facility], [place] [address] [place], [place] [address]    Culture (A)  Final    <10,000 COLONIES/mL INSIGNIFICANT GROWTH Performed at [place] [address], [place], [place] [address]    Report Status 11/09/2017 FINAL  Final         Radiology Studies: No results found.      Scheduled Meds: . enoxaparin (LOVENOX) injection  40 mg Subcutaneous QHS  . furosemide  40 mg Intravenous Q12H  . insulin aspart  0-9 Units Subcutaneous Q6H  . levothyroxine  50 mcg Intravenous Daily  . lip balm  1 application Topical BID  .  LORazepam  0.5 mg Intravenous Q24H   Continuous Infusions: . Marland KitchenTPN (CLINIMIX-E) Adult 75 mL/hr at 11/09/17 1746  . acetaminophen    . albumin human    . famotidine  (PEPCID) IV 20 mg (11/10/17 1159)  . methocarbamol (ROBAXIN)  IV 500 mg (11/10/17 1200)  . sodium chloride       LOS: 7 days    Time spent: 35 minutes    Irine Seal, MD [facility] Pager [phone]  If 7PM-7AM, please contact night-coverage [email or web address] Password [facility] 11/10/2017, 12:32 PM

## 2017-11-10 NOTE — Progress Notes (Signed)
PHARMACY - ADULT TOTAL PARENTERAL NUTRITION CONSULT NOTE   Pharmacy Consult for TPN Indication: Intolerance to enteral feeding    Patient Measurements: Height: 5' (152.4 cm) Weight: 115 lb (52.2 kg) IBW/kg (Calculated) : 45.5 TPN AdjBW (KG): 49 Body mass index is 22.46 kg/m.   Insulin Requirements: 3 units yesterday   Current Nutrition: NPO, ice chips  IVF: none ordered  Central access: PICC 5/25 TPN start date: 5/28  ASSESSMENT                                                                                                          HPI:  Patient has metastatic breast cancer and partial small bowel obstruction on full liquids for 4 months, now intolerance to clear liquids.   Significant events:  5/29 surgery Today:    Glucose - at goal of <150  Electrolytes -Na low at 127, K 3.7 (goal 4.0), others WNL  Renal - Scr 0.89, CrCl ~ 47  LFTs - WNL/low  TGs - 152  Prealbumin - 10.7  NUTRITIONAL GOALS                                                                                             RD recs: 5/30 1700-1800 Kcal and 85-95 gm protein Clinimix E 5/15 at a goal rate of 75 ml/hr + 20% fat emulsion at 81ml/hr to provide: 90 g/day protein, 1758 Kcal/day.  PLAN      KCl 41meq IV x 2                                                                                                     At 1800 today:  continue Clinimix E 5/15 at 75 ml/hr.to meet 100% of goals  20% fat emulsion at 56ml/hr.  TPN to contain standard multivitamins and trace elements.  Continue q6h sensitive SSI .   TPN lab panels on Mondays & Thursdays.  BMET and Mag & phos in AM  F/u daily.  Dolly Rias RPh 11/10/2017, 1:17 PM Pager [phone]

## 2017-11-10 NOTE — Progress Notes (Addendum)
[facility] Progress Note  2 Days Post-Op  Subjective: CC:  Abdominal pain worse today but patient has gotten out of bed and had sponge bath. Foley removed and having small amt dysuria. C/o reflux/belching. Having small amt stool in colostomy pouch. Minimal drainage from gastrostomy tube (20cc)  Objective: Vital signs in last 24 hours: Temp:  [98.1 F (36.7 C)-99 F (37.2 C)] 98.9 F (37.2 C) (05/31 0175) Pulse Rate:  [88-123] 123 (05/31 0638) Resp:  [16-18] 17 (05/31 1025) BP: (102-123)/(58-67) 123/67 (05/31 8527) SpO2:  [97 %-99 %] 97 % (05/31 7824) Weight:  [52.2 kg (115 lb)] 52.2 kg (115 lb) (05/31 0500) Last BM Date: 11/09/17(per colostomy)  Intake/Output from previous day: 05/30 0701 - 05/31 0700 In: 1864.5 [I.V.:1864.5] Out: 2353 [Urine:1075; Emesis/NG output:250; Drains:20; Stool:50] Intake/Output this shift: No intake/output data recorded.  PE: Gen:  Alert, NAD, pleasant Pulm:  Normal effort, clear to auscultation bilaterally Abd: Soft, diffuse abdominal tenderness to palpation without peritonitis, VAC in place, stoma viable and small amt stool and serosanguinous drainage in colostomy pouch, G-tube to gravity with small amt bilious drainage. Skin: warm and dry, no rashes  Psych: A&Ox3   Lab Results:  Recent Labs    11/09/17 0440 11/10/17 0442  WBC 2.3* 7.7  HGB 10.2* 9.3*  HCT 30.0* 27.4*  PLT 206 199   BMET Recent Labs    11/09/17 0440 11/10/17 0442  NA 129* 127*  K 3.9 3.7  CL 100* 96*  CO2 23 26  GLUCOSE 175* 108*  BUN 21* 28*  CREATININE 0.94 0.89  CALCIUM 8.0* 8.4*   PT/INR No results for input(s): LABPROT, INR in the last 72 hours. CMP     Component Value Date/Time   NA 127 (L) 11/10/2017 0442   NA 134 (L) 12/28/2016 1503   K 3.7 11/10/2017 0442   K 4.0 12/28/2016 1503   CL 96 (L) 11/10/2017 0442   CL 98 12/03/2012 1419   CO2 26 11/10/2017 0442   CO2 25 12/28/2016 1503   GLUCOSE 108 (H) 11/10/2017 0442   GLUCOSE 96  12/28/2016 1503   GLUCOSE 89 12/03/2012 1419   BUN 28 (H) 11/10/2017 0442   BUN 12.0 12/28/2016 1503   CREATININE 0.89 11/10/2017 0442   CREATININE 0.81 09/13/2017 1157   CREATININE 0.8 12/28/2016 1503   CALCIUM 8.4 (L) 11/10/2017 0442   CALCIUM 9.7 12/28/2016 1503   PROT 3.8 (L) 11/09/2017 0440   PROT 6.5 12/28/2016 1503   ALBUMIN 2.3 (L) 11/09/2017 0440   ALBUMIN 3.9 12/28/2016 1503   AST 25 11/09/2017 0440   AST 25 09/13/2017 1157   AST 22 12/28/2016 1503   ALT 8 (L) 11/09/2017 0440   ALT 12 09/13/2017 1157   ALT 20 12/28/2016 1503   ALKPHOS 37 (L) 11/09/2017 0440   ALKPHOS 76 12/28/2016 1503   BILITOT 0.1 (L) 11/09/2017 0440   BILITOT 0.6 09/13/2017 1157   BILITOT 0.50 12/28/2016 1503   GFRNONAA >60 11/10/2017 0442   GFRNONAA >60 09/13/2017 1157   GFRAA >60 11/10/2017 0442   GFRAA >60 09/13/2017 1157   Lipase     Component Value Date/Time   LIPASE 37 11/03/2017 1828       Studies/Results: No results found.  Anti-infectives: Anti-infectives (From admission, onward)   Start     Dose/Rate Route Frequency Ordered Stop   11/09/17 0000  cefoTEtan (CEFOTAN) 2 g in sodium chloride 0.9 % 100 mL IVPB     2 g 200 mL/hr over  30 Minutes Intravenous Every 12 hours 11/08/17 1748 11/09/17 0021   11/08/17 1021  sodium chloride 0.9 % with cefoTEtan (CEFOTAN) ADS Med    Note to Pharmacy:  Marchia Meiers   : cabinet override      11/08/17 1021 11/08/17 2229   11/08/17 0600  cefoTEtan (CEFOTAN) 2 g in sodium chloride 0.9 % 100 mL IVPB  Status:  Discontinued    Note to Pharmacy:  Pharmacy may adjust dose strength for optimal dosing.   Send with patient on call to the OR.  Anesthesia to complete antibiotic administration <75min prior to incision per [facility].   2 g 200 mL/hr over 30 Minutes Intravenous On call to O.R. 11/07/17 1234 11/07/17 1433   11/08/17 0600  cefoTEtan in Dextrose 5% (CEFOTAN) IVPB 2 g     2 g Intravenous On call to O.R. 11/07/17 1505 11/08/17 1207    11/07/17 1500  cefoTEtan in Dextrose 5% (CEFOTAN) IVPB 2 g  Status:  Discontinued     2 g Intravenous On call to O.R. 11/07/17 1433 11/07/17 1505       Assessment/Plan metastatic lobular carcinoma of the breast s/p left mastectomy and sentinel node biopsy in 2011; metastasis to pelvis last year s/p laparoscopic diverting loop sigmoid colostomy 2018 in [place].  - oncologist: Dr. [name]; plans to resume exemestane/ abemaciclib when she can take full liquids pSBO suspect 2/2 metastatic breast CA w/ known pelvic metastasis  S/P diagnostic laparoscopy, LOA, SBR, insertion gastrostomy tube, partial colectomy new colostomy 11/08/17 Dr. Johney Maine -  Afebrile, tachycardic to 123  - increase non-narcotic pain control by adding IV tylenol and IV robaxin; PRN dilaudid. - VAC and ostomy appliance change today by WOC, RN.  - having some bowel function, clamp G tube and check residuals q 6h - pepcid IV for reflux, - D/C foley today; follow UOP  - mobilize/IS  FEN: NPO, TNA, clamp gastrostomy tube  VTE: SCD's, Lovenox ID: perioperative cefotetan  Foley: removed 5/31 (POD#2) Follow up: Dr. Michael Boston, Dr. [name]    LOS: 7 days    Jill Alexanders , [facility] 11/10/2017, 9:55 AM Pager: [phone] Consults: [phone] Mon-Fri 7:00 am-4:30 pm Sat-Sun 7:00 am-11:30 am

## 2017-11-10 NOTE — Progress Notes (Unsigned)
I discussed the pathology report with Dr. Bess Harvest.  Arlyn's obstruction was entirely due to cancer and not to scarring.  It was all lobular breast cancer.  We are repeating a prognostic panel and also obtaining a PD-L1

## 2017-11-10 NOTE — Consult Note (Signed)
Cardiology Consultation:   Patient ID: Sonia Hill; [id number]; 25-Jul-1955   Admit date: 11/03/2017 Date of Consult: 11/10/2017  Primary Care Provider: [name], MD Primary Cardiologist: new Primary Electrophysiologist:  none   Patient Profile:   Sonia Hill is a 62 y.o. female with a hx of breast cancer who is being seen today for the evaluation of left bundle branch block at the request of Dr. Grandville Silos.  History of Present Illness:   [name] is a 62 year old female with metastatic breast cancer, rectal mass with recurrent small bowel obstruction admitted with severe excruciating abdominal discomfort with no ostomy output with electrolyte abnormalities.  ECG was performed and demonstrated new left bundle branch block.  No chest pain, no shortness of breath.  No prior cardiac history.  Echocardiogram showed normal ejection fraction.  Comfortable in bed with friends and family present. No CP, no SOB, no F/C/N.  Past Medical History:  Diagnosis Date  . Arthritis    neck, upper back, shoulders - no meds - yoga  . Chronic kidney disease    Only has right kidney  . Depression    Hx - no current problem  . GERD (gastroesophageal reflux disease)    diet controlled - no meds  . Hyperlipidemia    diet controlled  . Hypothyroidism   . lt breast ca dx'd 11/2009  . Osteoporosis, unspecified 12/06/2013  . PONV (postoperative nausea and vomiting)   . SVD (spontaneous vaginal delivery)    x 1    Past Surgical History:  Procedure Laterality Date  . BLADDER NECK RECONSTRUCTION     made a new urethea tube  . bladder tact  2008  . BOWEL RESECTION N/A 11/08/2017   Procedure: SMALL BOWEL RESECTION;  Surgeon: Michael Boston, MD;  Location: [facility];  Service: General;  Laterality: N/A;  . BREAST SURGERY     masectomy left breast  . coloscopy    . COLOSTOMY    . DILATION AND CURETTAGE OF UTERUS    . GASTROSTOMY N/A 11/08/2017   Procedure: INSERTION OF GASTROSTOMY TUBE;   Surgeon: Michael Boston, MD;  Location: [facility];  Service: General;  Laterality: N/A;  . LAPAROSCOPY N/A 01/24/2013   Procedure: LAPROSCOPY OPERATIVE;  Surgeon: Margarette Asal, MD;  Location: [facility];  Service: Gynecology;  Laterality: N/A;  . LAPAROSCOPY N/A 11/08/2017   Procedure: LAPAROSCOPY DIAGNOSTIC;  Surgeon: Michael Boston, MD;  Location: [facility];  Service: General;  Laterality: N/A;  . LYSIS OF ADHESION N/A 11/08/2017   Procedure: LYSIS OF ADHESION;  Surgeon: Michael Boston, MD;  Location: [facility];  Service: General;  Laterality: N/A;  . NEPHRECTOMY Left    left kidney dysfunction, recurrent pyelonephritis  . PARTIAL COLECTOMY N/A 11/08/2017   Procedure: PARTIAL COLECTOMY WITH NEW COLOSTOMY;  Surgeon: Michael Boston, MD;  Location: [facility];  Service: General;  Laterality: N/A;  . SALPINGOOPHORECTOMY Bilateral 01/24/2013   Procedure: SALPINGO OOPHORECTOMY;  Surgeon: Margarette Asal, MD;  Location: [facility];  Service: Gynecology;  Laterality: Bilateral;  . WISDOM TOOTH EXTRACTION       Home Medications:  Prior to Admission medications   Medication Sig Start Date End Date Taking? Authorizing Provider  abemaciclib (VERZENIO) 150 MG tablet Take 1 tablet (150 mg total) by mouth 2 (two) times daily. Swallow tablets whole. Do not chew, crush, or split tablets before swallowing. 10/18/17  Yes Magrinat, Virgie Dad, MD  ALPRAZolam (XANAX) 0.25 MG tablet TAKE 1 TABLET BY MOUTH EVERY 8 HOURS AS NEEDED  FOR ANXIETY OR SLEEP 08/28/17  Yes Magrinat, Virgie Dad, MD  cholecalciferol (D-VI-SOL) 400 UNIT/ML LIQD Take 400 Units by mouth daily.   Yes [provider]  exemestane (AROMASIN) 25 MG tablet Take 1 tablet (25 mg total) by mouth daily after breakfast. 07/05/17  Yes Magrinat, Virgie Dad, MD  gabapentin (NEURONTIN) 300 MG capsule Take 1 capsule (300 mg total) by mouth 3 (three) times daily as needed. Patient taking differently: Take 300 mg by mouth 3 (three) times daily as needed (nerve pain).  06/28/17  Yes  Magrinat, Virgie Dad, MD  ondansetron (ZOFRAN-ODT) 8 MG disintegrating tablet Take 1 tablet (8 mg total) by mouth every 8 (eight) hours as needed for nausea or vomiting. 06/22/17  Yes Magrinat, Virgie Dad, MD  polyethylene glycol (MIRALAX / GLYCOLAX) packet Take 17 g by mouth daily.   Yes [provider]  simethicone (MYLICON) 025 MG chewable tablet Chew 125 mg by mouth every 2 (two) hours as needed for flatulence.    Yes [provider]  Thyroid (NATURE-THROID) 81.25 MG TABS Take 81.25 mg by mouth daily.   Yes [provider]  promethazine (PHENERGAN) 25 MG suppository Place 1 suppository (25 mg total) rectally every 6 (six) hours as needed for nausea or vomiting. Patient not taking: Reported on 09/13/2017 06/22/17   Magrinat, Virgie Dad, MD    Inpatient Medications: Scheduled Meds: . enoxaparin (LOVENOX) injection  40 mg Subcutaneous QHS  . furosemide  40 mg Intravenous Q12H  . insulin aspart  0-9 Units Subcutaneous Q6H  . levothyroxine  50 mcg Intravenous Daily  . lip balm  1 application Topical BID  . LORazepam  0.5 mg Intravenous Q24H   Continuous Infusions: . Marland KitchenTPN (CLINIMIX-E) Adult 75 mL/hr at 11/09/17 1746  . acetaminophen Stopped (11/10/17 1525)  . albumin human    . famotidine (PEPCID) IV Stopped (11/10/17 1250)  . Marland KitchenTPN (CLINIMIX-E) Adult     And  . Fat emulsion    . methocarbamol (ROBAXIN)  IV Stopped (11/10/17 1250)  . potassium chloride 10 mEq (11/10/17 1631)  . sodium chloride     PRN Meds: albumin human, albuterol, alum & mag hydroxide-simeth, diphenhydrAMINE, guaiFENesin-dextromethorphan, hydrALAZINE, hydrocortisone, hydrocortisone cream, HYDROmorphone (DILAUDID) injection, LORazepam, magic mouthwash, menthol-cetylpyridinium, metoCLOPramide (REGLAN) injection, metoprolol tartrate, ondansetron **OR** ondansetron (ZOFRAN) IV, phenol, prochlorperazine, sodium chloride flush  Allergies:    Allergies  Allergen Reactions  . Sulfa Antibiotics Rash  .  Adhesive [Tape] Rash    Ok to use paper tape and tegaderm over IV site  . Betadine [Povidone Iodine] Rash  . Mercury Rash    Reaction mercurachrome    Social History:   Social History   Socioeconomic History  . Marital status: Married    Spouse name: Not on file  . Number of children: Not on file  . Years of education: Not on file  . Highest education level: Not on file  Occupational History  . Not on file  Social Needs  . Financial resource strain: Not on file  . Food insecurity:    Worry: Not on file    Inability: Not on file  . Transportation needs:    Medical: Not on file    Non-medical: Not on file  Tobacco Use  . Smoking status: Never Smoker  . Smokeless tobacco: Never Used  Substance and Sexual Activity  . Alcohol use: No  . Drug use: No  . Sexual activity: Yes    Birth control/protection: Post-menopausal  Lifestyle  . Physical activity:  Days per week: Not on file    Minutes per session: Not on file  . Stress: Not on file  Relationships  . Social connections:    Talks on phone: Not on file    Gets together: Not on file    Attends religious service: Not on file    Active member of club or organization: Not on file    Attends meetings of clubs or organizations: Not on file    Relationship status: Not on file  . Intimate partner violence:    Fear of current or ex partner: Not on file    Emotionally abused: Not on file    Physically abused: Not on file    Forced sexual activity: Not on file  Other Topics Concern  . Not on file  Social History Narrative  . Not on file    Family History:    Family History  Problem Relation Age of Onset  . Breast cancer Mother 40  . Breast cancer Maternal Aunt 58  . Breast cancer Other        MGF's sister dx with breast cancer in her 72s     ROS:  Please see the history of present illness.  All other ROS reviewed and negative.     Physical Exam/Data:   Vitals:   11/10/17 0500 11/10/17 0638 11/10/17 1524  11/10/17 1623  BP:  123/67 (!) 100/59 116/71  Pulse:  (!) 123 (!) 101 (!) 114  Resp:  17 16 18   Temp:  98.9 F (37.2 C)  98.2 F (36.8 C)  TempSrc:  Oral  Oral  SpO2:  97% 98% 100%  Weight: 115 lb (52.2 kg)     Height:        Intake/Output Summary (Last 24 hours) at 11/10/2017 1635 Last data filed at 11/10/2017 1631 Gross per 24 hour  Intake 2464.5 ml  Output 845 ml  Net 1619.5 ml   Filed Weights   11/08/17 1100 11/09/17 0408 11/10/17 0500  Weight: 108 lb (49 kg) 114 lb 6.7 oz (51.9 kg) 115 lb (52.2 kg)   Body mass index is 22.46 kg/m.  General: Ill-appearing, no distress HEENT: normal Lymph: no adenopathy Neck: no JVD Endocrine:  No thryomegaly Vascular: No carotid bruits; FA pulses 2+ bilaterally without bruits  Cardiac: Mildly tachycardic S1, S2; RRR; no murmur  Lungs:  clear to auscultation bilaterally, no wheezing, rhonchi or rales  Abd: Wound VAC in place Ext: 2+ bilateral lower extremity edema Musculoskeletal:  No deformities, BUE and BLE strength normal and equal Skin: warm and dry  Neuro:  CNs 2-12 intact, no focal abnormalities noted Psych:  Normal affect   EKG:  The EKG was personally reviewed and demonstrates: 11/08/2017-left bundle branch block sinus rhythm 98 bpm  Telemetry:  Telemetry was personally reviewed and demonstrates: Sinus rhythm/sinus tachycardia left bundle branch block  Relevant CV Studies:  ECHO: - Left ventricle: The cavity size was normal. Systolic function was   vigorous. The estimated ejection fraction was in the range of 65%   to 70%. Wall motion was normal; there were no regional wall   motion abnormalities. Left ventricular diastolic function   parameters were normal. - Mitral valve: Calcified annulus. - Atrial septum: No subcostal images due to surgical dressing. No   defect or patent foramen ovale was identified.  Laboratory Data:  Chemistry Recent Labs  Lab 11/08/17 0424 11/09/17 0440 11/10/17 0442  NA 127* 129* 127*    K 3.9 3.9 3.7  CL 93*  100* 96*  CO2 26 23 26   GLUCOSE 95 175* 108*  BUN 11 21* 28*  CREATININE 1.02* 0.94 0.89  CALCIUM 8.8* 8.0* 8.4*  GFRNONAA 58* >60 >60  GFRAA >60 >60 >60  ANIONGAP 8 6 5     Recent Labs  Lab 11/07/17 0417 11/08/17 0424 11/09/17 0440  PROT 5.2* 5.2* 3.8*  ALBUMIN 2.8*  2.8* 2.9* 2.3*  AST 29 31 25   ALT 9* 13* 8*  ALKPHOS 57 64 37*  BILITOT 0.6 0.3 0.1*   Hematology Recent Labs  Lab 11/08/17 0424 11/09/17 0112 11/09/17 0440 11/10/17 0442  WBC 4.7  --  2.3* 7.7  RBC 4.12  --  3.48* 3.26*  HGB 11.7* 10.2* 10.2* 9.3*  HCT 35.1* 30.4* 30.0* 27.4*  MCV 85.2  --  86.2 84.0  MCH 28.4  --  29.3 28.5  MCHC 33.3  --  34.0 33.9  RDW 13.9  --  13.9 13.8  PLT 271  --  206 199   Cardiac Enzymes Recent Labs  Lab 11/08/17 2351  TROPONINI <0.03   No results for input(s): TROPIPOC in the last 168 hours.  BNPNo results for input(s): BNP, PROBNP in the last 168 hours.  DDimer No results for input(s): DDIMER in the last 168 hours.  Radiology/Studies:  Dg Abd 2 Views  Result Date: 11/07/2017 CLINICAL DATA:  62 year old female with small bowel obstruction. Recent colonic surgery. Subsequent encounter. EXAM: ABDOMEN - 2 VIEW COMPARISON:  11/05/2017 plain film examination.  11/03/2017 CT. FINDINGS: Gas and fluid distended dilated small bowel loops more prominent than on the recent plain film examination with small bowel measuring up to 4.7 cm versus prior 4.5 cm. Small bowel fold thickening. Air-fluid levels. Interval development of gas filled stomach. Minimal residual contrast within colon. Colostomy left lower quadrant. No free intraperitoneal air noted. IMPRESSION: Progressive partial small bowel obstructive pattern. Interval development of gas-filled stomach. No free intraperitoneal air. These results will be called to the ordering clinician or representative by the Radiologist Assistant, and communication documented in the PACS or zVision Dashboard.  Electronically Signed   By: Genia Del M.D.   On: 11/07/2017 09:05    Assessment and Plan:   Left bundle branch block with sinus tachycardia - No further work-up necessary.  Normal EF on echocardiogram.  EKG showed borderline left bundle branch block 2 days ago.  Widening/intraventricular conduction delay may be a result of heart rate change/metabolic derangement/electrolyte changes.  Certainly she is not having any cardiac symptoms at this point.  Thankfully, her ejection fraction is normal.  Continue with supportive care. K>4, Mag>2  Metastatic breast cancer -Per oncology  Small bowel obstruction - Per surgical team.  Edema -Agree that hypoalbuminemia is playing a role.  Please let us know if we can be of further assistance.   For questions or updates, please contact [facility] Please consult [email or web address] for contact info under Cardiology/STEMI.   Signed, Candee Furbish, MD  11/10/2017 4:35 PM

## 2017-11-11 ENCOUNTER — Inpatient Hospital Stay (HOSPITAL_COMMUNITY): Payer: 59

## 2017-11-11 DIAGNOSIS — R066 Hiccough: Secondary | ICD-10-CM

## 2017-11-11 DIAGNOSIS — E440 Moderate protein-calorie malnutrition: Secondary | ICD-10-CM

## 2017-11-11 LAB — GLUCOSE, CAPILLARY
GLUCOSE-CAPILLARY: 101 mg/dL — AB (ref 65–99)
GLUCOSE-CAPILLARY: 101 mg/dL — AB (ref 65–99)
GLUCOSE-CAPILLARY: 128 mg/dL — AB (ref 65–99)
Glucose-Capillary: 110 mg/dL — ABNORMAL HIGH (ref 65–99)
Glucose-Capillary: 92 mg/dL (ref 65–99)

## 2017-11-11 LAB — BASIC METABOLIC PANEL
ANION GAP: 6 (ref 5–15)
BUN: 31 mg/dL — ABNORMAL HIGH (ref 6–20)
CALCIUM: 8.9 mg/dL (ref 8.9–10.3)
CO2: 24 mmol/L (ref 22–32)
Chloride: 93 mmol/L — ABNORMAL LOW (ref 101–111)
Creatinine, Ser: 0.9 mg/dL (ref 0.44–1.00)
GLUCOSE: 106 mg/dL — AB (ref 65–99)
POTASSIUM: 4.1 mmol/L (ref 3.5–5.1)
SODIUM: 123 mmol/L — AB (ref 135–145)

## 2017-11-11 LAB — URINALYSIS, ROUTINE W REFLEX MICROSCOPIC
Bilirubin Urine: NEGATIVE
GLUCOSE, UA: NEGATIVE mg/dL
Ketones, ur: NEGATIVE mg/dL
Leukocytes, UA: NEGATIVE
Nitrite: NEGATIVE
PH: 6 (ref 5.0–8.0)
Protein, ur: NEGATIVE mg/dL
Specific Gravity, Urine: <1.005 — ABNORMAL LOW (ref 1.005–1.030)

## 2017-11-11 LAB — URINALYSIS, MICROSCOPIC (REFLEX)
Bacteria, UA: NONE SEEN
RBC / HPF: NONE SEEN RBC/hpf (ref 0–5)
SQUAMOUS EPITHELIAL / LPF: NONE SEEN (ref 0–5)

## 2017-11-11 LAB — SODIUM, URINE, RANDOM: Sodium, Ur: 29 mmol/L

## 2017-11-11 LAB — CREATININE, URINE, RANDOM: CREATININE, URINE: 17.41 mg/dL

## 2017-11-11 LAB — MAGNESIUM: MAGNESIUM: 2 mg/dL (ref 1.7–2.4)

## 2017-11-11 LAB — PHOSPHORUS: Phosphorus: 2.5 mg/dL (ref 2.5–4.6)

## 2017-11-11 LAB — OSMOLALITY, URINE: Osmolality, Ur: 298 mOsm/kg — ABNORMAL LOW (ref 300–900)

## 2017-11-11 LAB — OSMOLALITY: Osmolality: 262 mOsm/kg — ABNORMAL LOW (ref 275–295)

## 2017-11-11 MED ORDER — METOCLOPRAMIDE HCL 5 MG/ML IJ SOLN
5.0000 mg | Freq: Three times a day (TID) | INTRAMUSCULAR | Status: DC
  Administered 2017-11-11 – 2017-11-12 (×8): 5 mg via INTRAVENOUS
  Filled 2017-11-11 (×10): qty 2

## 2017-11-11 MED ORDER — FAT EMULSION PLANT BASED 20 % IV EMUL
240.0000 mL | INTRAVENOUS | Status: AC
  Administered 2017-11-11: 240 mL via INTRAVENOUS
  Filled 2017-11-11: qty 250

## 2017-11-11 MED ORDER — BENZTROPINE MESYLATE 1 MG/ML IJ SOLN
1.0000 mg | Freq: Two times a day (BID) | INTRAMUSCULAR | Status: AC
  Administered 2017-11-11 (×2): 1 mg via INTRAMUSCULAR
  Filled 2017-11-11 (×2): qty 1

## 2017-11-11 MED ORDER — ACETAMINOPHEN 10 MG/ML IV SOLN
1000.0000 mg | Freq: Four times a day (QID) | INTRAVENOUS | Status: AC | PRN
  Administered 2017-11-12: 1000 mg via INTRAVENOUS
  Filled 2017-11-11: qty 100

## 2017-11-11 MED ORDER — SODIUM CHLORIDE 1 G PO TABS
2.0000 g | ORAL_TABLET | Freq: Two times a day (BID) | ORAL | Status: DC
  Filled 2017-11-11 (×2): qty 2

## 2017-11-11 MED ORDER — CHLORPROMAZINE HCL 25 MG/ML IJ SOLN
25.0000 mg | Freq: Four times a day (QID) | INTRAMUSCULAR | Status: DC | PRN
  Filled 2017-11-11: qty 1

## 2017-11-11 MED ORDER — BENZTROPINE MESYLATE 1 MG/ML IJ SOLN: 1.0000 mg | Freq: Two times a day (BID) | INTRAMUSCULAR | Status: DC

## 2017-11-11 MED ORDER — TRACE MINERALS CR-CU-MN-SE-ZN 10-1000-500-60 MCG/ML IV SOLN
INTRAVENOUS | Status: AC
  Administered 2017-11-11: 18:00:00 via INTRAVENOUS
  Filled 2017-11-11: qty 1800

## 2017-11-11 MED ORDER — ALBUMIN HUMAN 25 % IV SOLN
50.0000 g | Freq: Two times a day (BID) | INTRAVENOUS | Status: DC
  Administered 2017-11-11 – 2017-11-13 (×5): 50 g via INTRAVENOUS
  Filled 2017-11-11 (×5): qty 200

## 2017-11-11 MED ORDER — SODIUM CHLORIDE 0.9 % IV SOLN
12.5000 mg | Freq: Four times a day (QID) | INTRAVENOUS | Status: DC | PRN
  Administered 2017-11-11: 12.5 mg via INTRAVENOUS
  Filled 2017-11-11 (×2): qty 0.5

## 2017-11-11 NOTE — Progress Notes (Signed)
Pt had an adverse reaction to thorazine and was given cogentin to counter act effects per md order. Pts adverse reaction was agitation. Administered all meds per md orders and assisted pt with comfort measures for agitation as much as possible. Passed report to oncoming nurse.

## 2017-11-11 NOTE — Consult Note (Addendum)
Renal Service Consult Note [facility]  Sonia Hill 11/11/2017 Sol [place]azing Requesting Physician:  Dr Grandville Silos  Reason for Consult:  Hyponatremia HPI: The patient is a 62 y.o. year-old with hx of stage IV breast cancer dx'd in 2011 now with peritoneal carcinomatosis and SBO admitted here on 11/03/17 with abd pain and nausea and on 11/08/17 underwent SB resection/ LOA / insertion G-tube and partial colectomy/ new colostomy for malignant bowel obstruction. Serum Na+ was 132 on admission and has slowly dropped down to 123 today.  Asked to see for hyponatremia.    Husband gives hx that patient has had leg edema prior to admission , possibly due to the "cancer in the abdomen" causing lymphatic obstruction.  It is worse here.    Inpatient meds: lovenox / iv lasix/ neurontin/ insulin / T4 / ativan / reglan IV/ metoprolol IV/ scopolamine/ TPN / iv tylenol/ iv albumin/ cefotetan IV / thorazine iv/ pepcid iv/ robaxin iv/ dilaudid iv/ morhpine and zofran iv  Renal US in early may showed single kidney on the L and +hydronephrosis on the R.   Had ct abdomen and pelvis on 11/03/17 without iv contrast which showed mild/ mod hydro R kidney and ureter not changed from prior CT jan 2019. Absent left kidney normal bladder.  +7kg since admission. TSH is normal.  Echo here w/ normal LVEF   ROS  denies CP  no joint pain   no HA  no blurry vision  no rash  no diarrhea  no nausea/ vomiting  no dysuria  no difficulty voiding  no change in urine color    Past Medical History  Past Medical History:  Diagnosis Date  . Arthritis    neck, upper back, shoulders - no meds - yoga  . Chronic kidney disease    Only has right kidney  . Depression    Hx - no current problem  . GERD (gastroesophageal reflux disease)    diet controlled - no meds  . Hyperlipidemia    diet controlled  . Hypothyroidism   . lt breast ca dx'd 11/2009  . Osteoporosis, unspecified 12/06/2013  . PONV (postoperative  nausea and vomiting)   . SVD (spontaneous vaginal delivery)    x 1   Past Surgical History  Past Surgical History:  Procedure Laterality Date  . BLADDER NECK RECONSTRUCTION     made a new urethea tube  . bladder tact  2008  . BOWEL RESECTION N/A 11/08/2017   Procedure: SMALL BOWEL RESECTION;  Surgeon: Michael Boston, MD;  Location: [facility];  Service: General;  Laterality: N/A;  . BREAST SURGERY     masectomy left breast  . coloscopy    . COLOSTOMY    . DILATION AND CURETTAGE OF UTERUS    . GASTROSTOMY N/A 11/08/2017   Procedure: INSERTION OF GASTROSTOMY TUBE;  Surgeon: Michael Boston, MD;  Location: [facility];  Service: General;  Laterality: N/A;  . LAPAROSCOPY N/A 01/24/2013   Procedure: LAPROSCOPY OPERATIVE;  Surgeon: Margarette Asal, MD;  Location: [facility];  Service: Gynecology;  Laterality: N/A;  . LAPAROSCOPY N/A 11/08/2017   Procedure: LAPAROSCOPY DIAGNOSTIC;  Surgeon: Michael Boston, MD;  Location: [facility];  Service: General;  Laterality: N/A;  . LYSIS OF ADHESION N/A 11/08/2017   Procedure: LYSIS OF ADHESION;  Surgeon: Michael Boston, MD;  Location: [facility];  Service: General;  Laterality: N/A;  . NEPHRECTOMY Left    left kidney dysfunction, recurrent pyelonephritis  . PARTIAL COLECTOMY N/A 11/08/2017  Procedure: PARTIAL COLECTOMY WITH NEW COLOSTOMY;  Surgeon: Michael Boston, MD;  Location: [facility];  Service: General;  Laterality: N/A;  . SALPINGOOPHORECTOMY Bilateral 01/24/2013   Procedure: SALPINGO OOPHORECTOMY;  Surgeon: Margarette Asal, MD;  Location: [facility];  Service: Gynecology;  Laterality: Bilateral;  . WISDOM TOOTH EXTRACTION     Family History  Family History  Problem Relation Age of Onset  . Breast cancer Mother 65  . Breast cancer Maternal Aunt 58  . Breast cancer Other        MGF's sister dx with breast cancer in her 61s   Social History  reports that she has never smoked. She has never used smokeless tobacco. She reports that she does not drink alcohol or use  drugs. Allergies  Allergies  Allergen Reactions  . Sulfa Antibiotics Rash  . Adhesive [Tape] Rash    Ok to use paper tape and tegaderm over IV site  . Betadine [Povidone Iodine] Rash  . Mercury Rash    Reaction mercurachrome   Home medications Prior to Admission medications   Medication Sig Start Date End Date Taking? Authorizing Provider  abemaciclib (VERZENIO) 150 MG tablet Take 1 tablet (150 mg total) by mouth 2 (two) times daily. Swallow tablets whole. Do not chew, crush, or split tablets before swallowing. 10/18/17  Yes Magrinat, Virgie Dad, MD  ALPRAZolam Duanne Moron) 0.25 MG tablet TAKE 1 TABLET BY MOUTH EVERY 8 HOURS AS NEEDED FOR ANXIETY OR SLEEP 08/28/17  Yes Magrinat, Virgie Dad, MD  cholecalciferol (D-VI-SOL) 400 UNIT/ML LIQD Take 400 Units by mouth daily.   Yes [provider]  exemestane (AROMASIN) 25 MG tablet Take 1 tablet (25 mg total) by mouth daily after breakfast. 07/05/17  Yes Magrinat, Virgie Dad, MD  gabapentin (NEURONTIN) 300 MG capsule Take 1 capsule (300 mg total) by mouth 3 (three) times daily as needed. Patient taking differently: Take 300 mg by mouth 3 (three) times daily as needed (nerve pain).  06/28/17  Yes Magrinat, Virgie Dad, MD  ondansetron (ZOFRAN-ODT) 8 MG disintegrating tablet Take 1 tablet (8 mg total) by mouth every 8 (eight) hours as needed for nausea or vomiting. 06/22/17  Yes Magrinat, Virgie Dad, MD  polyethylene glycol (MIRALAX / GLYCOLAX) packet Take 17 g by mouth daily.   Yes [provider]  simethicone (MYLICON) 211 MG chewable tablet Chew 125 mg by mouth every 2 (two) hours as needed for flatulence.    Yes [provider]  Thyroid (NATURE-THROID) 81.25 MG TABS Take 81.25 mg by mouth daily.   Yes [provider]  promethazine (PHENERGAN) 25 MG suppository Place 1 suppository (25 mg total) rectally every 6 (six) hours as needed for nausea or vomiting. Patient not taking: Reported on 09/13/2017 06/22/17   Chauncey Cruel, MD    Liver Function Tests Recent Labs  Lab 11/07/17 [phone] 11/08/17 0424 11/09/17 0440  AST 29 31 25   ALT 9* 13* 8*  ALKPHOS 57 64 37*  BILITOT 0.6 0.3 0.1*  PROT 5.2* 5.2* 3.8*  ALBUMIN 2.8*  2.8* 2.9* 2.3*   No results for input(s): LIPASE, AMYLASE in the last 168 hours. CBC Recent Labs  Lab 11/07/17 0417 11/08/17 0424 11/09/17 0112 11/09/17 0440 11/10/17 0442  WBC 6.2 4.7  --  2.3* 7.7  NEUTROABS 5.0 3.4  --   --  7.0  HGB 12.0 11.7* 10.2* 10.2* 9.3*  HCT 35.5* 35.1* 30.4* 30.0* 27.4*  MCV 85.5 85.2  --  86.2 84.0  PLT 328 271  --  206 263   Basic Metabolic Panel Recent Labs  Lab 11/05/17 0414 11/06/17 0421 11/07/17 0417 11/08/17 0424 11/09/17 0440 11/10/17 0442 11/11/17 0508  NA 129* 130*  130* 130*  130* 127* 129* 127* 123*  K 3.9 3.6  3.6 4.0  4.0 3.9 3.9 3.7 4.1  CL 100* 97*  96* 95*  94* 93* 100* 96* 93*  CO2 20* 26  26 26  26 26 23 26 24   GLUCOSE 84 100*  99 86  85 95 175* 108* 106*  BUN 10 8  8 7  7 11  21* 28* 31*  CREATININE 0.92 0.91  0.91 1.05*  0.97 1.02* 0.94 0.89 0.90  CALCIUM 8.6* 8.2*  8.3* 8.9  8.9 8.8* 8.0* 8.4* 8.9  PHOS  --  2.4* 2.7 3.4 3.5 3.0 2.5   Iron/TIBC/Ferritin/ %Sat    Component Value Date/Time   FERRITIN 26 06/02/2008 1312    Vitals:   11/10/17 1623 11/10/17 2129 11/11/17 0500 11/11/17 0646  BP: 116/71 (!) 116/57  (!) 158/114  Pulse: (!) 114 (!) 102  (!) 112  Resp: 18 12  (!) 2  Temp: 98.2 F (36.8 C) 98.6 F (37 C)  97.7 F (36.5 C)  TempSrc: Oral Oral    SpO2: 100% 98%  100%  Weight:   55.8 kg (123 lb 1.6 oz)   Height:       Exam Gen small framed wf no distress No rash, cyanosis or gangrene Sclera anicteric, throat clear  No jvd or bruits Chest clear bilat to bases no wheezing or rales RRR hyperdynanmic precordium, no S3 no murmur Abd mod abd wall edema, midline wound w/ G-tube in place +bs GU defer MS no joint effusions or deformity Ext 2-3+ diffuse bilat leg edema up to the hips, no  wounds or ulcers Neuro is alert, nonfocal   na 134 k 4.1  Bun 31  Creat 0.9  Glu 106  CO2 24  Wbc 7k  Hb 9.3 plt 199  ua 6/1 > negative  urine sodium = 29  urine creat = 17   urine osm = 247  echo on 5/31 = normal EF no wma  I/o = 15L in and 7.5 L out = +7.5 L  weights 49kg on admit >> 56 kg today    BUN 13 on admit >>> 31 today  Creat 0.58 on admit >>> 0.90 today  CO2 16 >>> 24  K+ 2.5 >>> 4.1  Na 132 >>> 123  Ca 5.6 >>> 8.9  alb 1.9 >>> 2.3  Hb 14 >>> 9.3  plt 419 >>> 199  wbc 9K >>> 7.7k  Impression: 1  Hyponatremia - suspect hypervolemia w/ anasarca however this fluid may be 3rd spaced from lymphatic obstruction so there is possiblity of euvolemia or even possibly intravasc hypovolemia. Difficult situation.  Creat ^'d 0.5 > 0.9 here, has been getting some IV lasix.  Will add IV albumin and low dose salt tabs, check CXR; cont IV lasix for now. Compression stockings. If creat rises further would need to dc lasix. Pt has solitary kidney.  Prognosis poor.  2   Solitary kidney 3   Breast cancer/ stage IV 4   Peritoneal carcinomatosis 5   SBO - sp exlap with LOA/ sb resection/ redo of colostomy w/ partial colect   Plan - as above will follow  Kelly Splinter MD [place] pager [phone]   11/11/2017, 1:53 PM

## 2017-11-11 NOTE — Progress Notes (Signed)
Sonia Hill [id number] [birth year]  CARE TEAM:  PCP: [name], MD  Outpatient Care Team: Patient Care Team: [name], MD as PCP - General (Family Medicine) Magrinat, Virgie Dad, MD as Consulting Physician (Oncology) Molli Posey, MD as Consulting Physician (Obstetrics and Gynecology) Laurence Spates, MD as Consulting Physician (Gastroenterology) Leighton Ruff, MD as Consulting Physician (General Surgery) Delrae Rend, MD as Consulting Physician (Endocrinology) Clarene Essex, MD as Consulting Physician (Gastroenterology) Donato Heinz, MD as Consulting Physician (Nephrology)  Inpatient Treatment Team: Treatment Team: Attending Provider: Eugenie Filler, MD; Consulting Physician: Edison Pace, Md, MD; Technician: Elwanda [place], [place]; Technician: Darrick Grinder, NT; Registered Nurse: Charlyne Petrin, RN; Consulting Physician: [name] Virgie Dad, MD; Consulting Physician: Nicholas Lose, MD; Registered Nurse: Kerrin Mo, RN; Rounding Team: Redmond Baseman, MD; Technician: Candie Mile, NT; Registered Nurse: Lissa Morales Rudi Heap, RN; Technician: Vira Blanco, NT; Technician: Lucila Maine, NT; Registered Nurse: Gentry Roch, RN; Registered Nurse: Carolann Littler, RN; Technician: Benedetto Coons, [place]; Consulting Physician: Roney Jaffe, MD   Problem List:   Principal Problem:   Small bowel obstruction from metastasis s/p SB resection 11/08/2017 Active Problems:   Breast cancer metastasized to multiple sites [facility])   Protein-calorie malnutrition, severe   Hyponatremia   Hydronephrosis   Solitary kidney   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[place])   Hypoglycemia   Hypokalemia   Hypocalcemia   Colostomy obstruction from metastasis s/p colectomy/ostomy revision 11/08/2017   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 Small intestine, resection, stricture x 2 - METASTATIC LOBULAR CARCINOMA. - TUMOR INVOLVES MARGIN. - SEE COMMENT. 2. Colon, colostomy stoma - METASTATIC LOBULAR CARCINOMA. - TUMOR INVOLVES MARGIN. - TUMOR INVOLVES OSTOMY SITE. - TUMOR INVOLVES ADHERENT OMENTUM. Microscopic Comment 1. Prognostic markers will be ordered. The case was discussed with Dr. [name] on 11/10/2017. Vicente Males MD Pathologist, Electronic Signature (Case signed 11/10/2017) Specimen Domnique Vantine and Clinical Information Specimen(s) Obtained: 1. Small intestine, resection, stricture x 2 2. Colon, colostomy stoma Specimen Clinical   Assessment  Worsening ileus status post small intestinal and colostomy resections for lobular metastases  Plan:  Go back to sips only.  Replace gastrostomy tube to LIWS suction to more aggressively decompress.  Aggressive nausea control.  Maalox as needed for bloating  metastatic lobular carcinoma of the breast -s/p left mastectomy and sentinel node biopsy in 2011; metastasis to pelvis last year s/p laparoscopic diverting loop sigmoid colostomy 2018 in [place].  -oncologist Dr. [name] plans to resume exemestane/ abemaciclib when she can take full liquids  GERD - pepcid IV.  Maalox as needed.  PPI if needed per primary service -FEN: NPO, TNA, Continue parenteral IV nutrition VTE: SCD's, Lovenox ID: perioperative cefotetan  Foley: removed 5/31 (POD#2) Follow up: Dr. Michael Boston, Dr. [name]  mobilize as tolerated to help  recovery   25 minutes spent in review, evaluation, examination, counseling, and coordination of care.  More than 50% of that  time was spent in counseling.  Discussed with nursing.  Discussed with Dr. Wallace Keller, M.D., F.A.C.S. Gastrointestinal and Minimally Invasive Surgery [facility] [address] [place], [place] [address] [phone] Main / Paging   11/11/2017    Subjective: (Chief complaint)  More bloated with hiccups and nausea and reflux.  Just trying to do ice chips. Family at bedside Seen by Dr. [name].  Objective:  Vital signs:  Vitals:   11/10/17 1623 11/10/17 2129 11/11/17 0500 11/11/17 0646  BP: 116/71 (!) 116/57  (!) 158/114  Pulse: (!) 114 (!) 102  (!) 112  Resp: 18 12  (!) 2  Temp: 98.2 F (36.8 C) 98.6 F (37 C)  97.7 F (36.5 C)  TempSrc: Oral Oral    SpO2: 100% 98%  100%  Weight:   55.8 kg (123 lb 1.6 oz)   Height:        Last BM Date: 11/10/17(colonstomy; per pt)  Intake/Output   Yesterday:  05/31 0701 - 06/01 0700 In: 1090 [I.V.:685; IV Piggyback:405] Out: 202 [Urine:202] This shift:  Total I/O In: -  Out: 200 [Urine:200]  Bowel function:  Flatus: No  BM:  No  Drain: Gastrostomy tube clamped for now   Physical Exam:  General: Pt awake/alert/oriented x4 in mild acute distress Eyes: PERRL, normal EOM.  Sclera clear.  No icterus Neuro: CN II-XII intact w/o focal sensory/motor deficits. Lymph: No head/neck/groin lymphadenopathy Psych:  No delerium/psychosis/paranoia HENT: Normocephalic, Mucus membranes moist.  No thrush Neck: Supple, No tracheal deviation Chest: No chest wall pain w good excursion CV:  Pulses intact.  Regular rhythm MS: Normal AROM mjr joints.  No obvious deformity  Abdomen: Somewhat firm.  Moderately distended.  Mildly tender at incisions only.  No evidence of peritonitis.  No incarcerated hernias.  Ext:  No deformity.  2+ edema.  No cyanosis Skin: No  petechiae / purpura  Results:   Labs: Results for orders placed or performed during the hospital encounter of 11/03/17 (from the past 48 hour(s))  Glucose, capillary     Status: Abnormal   Collection Time: 11/09/17  4:54 PM  Result Value Ref Range   Glucose-Capillary 123 (H) 65 - 99 mg/dL  Glucose, capillary     Status: Abnormal   Collection Time: 11/10/17 12:58 AM  Result Value Ref Range   Glucose-Capillary 124 (H) 65 - 99 mg/dL  Basic metabolic panel     Status: Abnormal   Collection Time: 11/10/17  4:42 AM  Result Value Ref Range   Sodium 127 (L) 135 - 145 mmol/L   Potassium 3.7 3.5 - 5.1 mmol/L   Chloride 96 (L) 101 - 111 mmol/L   CO2 26 22 - 32 mmol/L   Glucose, Bld 108 (H) 65 - 99 mg/dL   BUN 28 (H) 6 - 20 mg/dL   Creatinine, Ser 0.89 0.44 - 1.00 mg/dL   Calcium 8.4 (L) 8.9 - 10.3 mg/dL   GFR calc non Af Amer >60 >60 mL/min   GFR calc Af Amer >60 >60 mL/min    Comment: (NOTE) The eGFR has been calculated using the CKD EPI equation. This calculation has not been validated in all clinical situations. eGFR's persistently <60 mL/min signify possible Chronic Kidney Disease.    Anion gap 5 5 - 15    Comment: Performed at [facility], [place] [address] [place], [place] [address]  Magnesium     Status: None   Collection Time: 11/10/17  4:42 AM  Result Value Ref Range   Magnesium 2.1 1.7 - 2.4 mg/dL    Comment: Performed at [facility], [place] [address], [place], [place] [address]  Phosphorus     Status: None   Collection Time: 11/10/17  4:42 AM  Result Value Ref Range   Phosphorus 3.0 2.5 - 4.6 mg/dL    Comment: Performed at [facility], [place] [address], [place], [place] [address]  CBC with Differential/Platelet     Status: Abnormal   Collection Time: 11/10/17  4:42 AM  Result Value Ref Range   WBC 7.7 4.0 - 10.5 K/uL   RBC 3.26 (L) 3.87 - 5.11 MIL/uL   Hemoglobin 9.3 (L) 12.0 - 15.0 g/dL   HCT 27.4 (L) 36.0 -  46.0 %   MCV 84.0 78.0 - 100.0 fL   MCH 28.5 26.0 - 34.0 pg   MCHC 33.9 30.0 - 36.0 g/dL   RDW 13.8 11.5 - 15.5 %   Platelets 199 150 - 400 K/uL   Neutrophils Relative % 90 %   Neutro Abs 7.0 1.7 - 7.7 K/uL   Lymphocytes Relative 5 %   Lymphs Abs 0.4 (L) 0.7 - 4.0 K/uL   Monocytes Relative 5 %   Monocytes Absolute 0.4 0.1 - 1.0 K/uL   Eosinophils Relative 0 %   Eosinophils Absolute 0.0 0.0 - 0.7 K/uL   Basophils Relative 0 %   Basophils Absolute 0.0 0.0 - 0.1 K/uL   WBC Morphology MILD LEFT SHIFT (1-5% METAS, OCC MYELO, OCC BANDS)     Comment: DOHLE BODIES Performed at [facility] [address], [place], [place] [address]   Glucose, capillary     Status: Abnormal   Collection Time: 11/10/17  6:34 AM  Result Value Ref Range   Glucose-Capillary 122 (H) 65 - 99 mg/dL  Glucose, capillary     Status: Abnormal   Collection Time: 11/10/17 11:55 AM  Result Value Ref Range   Glucose-Capillary 127 (H) 65 - 99 mg/dL  Glucose, capillary     Status: None   Collection Time: 11/10/17  5:31 PM  Result Value Ref Range   Glucose-Capillary 90 65 - 99 mg/dL  Glucose, capillary     Status: Abnormal   Collection Time: 11/10/17 11:59 PM  Result Value Ref Range   Glucose-Capillary 110 (H) 65 - 99 mg/dL  Basic metabolic panel     Status: Abnormal   Collection Time: 11/11/17  5:08 AM  Result Value Ref Range   Sodium 123 (L) 135 - 145 mmol/L   Potassium 4.1 3.5 - 5.1 mmol/L   Chloride 93 (L) 101 - 111 mmol/L   CO2 24 22 - 32 mmol/L   Glucose, Bld 106 (H) 65 - 99 mg/dL   BUN 31 (H) 6 - 20 mg/dL   Creatinine, Ser 0.90 0.44 - 1.00 mg/dL   Calcium 8.9 8.9 - 10.3 mg/dL   GFR calc non Af Amer >60 >60 mL/min   GFR calc Af Amer >60 >60 mL/min    Comment: (NOTE) The eGFR has been calculated using the CKD EPI equation. This calculation has not been validated in all clinical situations. eGFR's persistently <60 mL/min signify possible Chronic Kidney Disease.    Anion gap 6 5 -  15    Comment: Performed at [facility], [place] [address] [place], [place] [address]  Magnesium     Status: None   Collection Time: 11/11/17  5:08 AM  Result  Value Ref Range   Magnesium 2.0 1.7 - 2.4 mg/dL    Comment: Performed at [facility], [place] [address] [place], [place] [address]  Phosphorus     Status: None   Collection Time: 11/11/17  5:08 AM  Result Value Ref Range   Phosphorus 2.5 2.5 - 4.6 mg/dL    Comment: Performed at [facility], [place] [address] [place], [place] [address]  Glucose, capillary     Status: None   Collection Time: 11/11/17  6:41 AM  Result Value Ref Range   Glucose-Capillary 92 65 - 99 mg/dL  Urinalysis, Routine w reflex microscopic     Status: Abnormal   Collection Time: 11/11/17  8:18 AM  Result Value Ref Range   Color, Urine YELLOW YELLOW   APPearance CLEAR CLEAR   Specific Gravity, Urine <1.005 (L) 1.005 - 1.030   pH 6.0 5.0 - 8.0   Glucose, UA NEGATIVE NEGATIVE mg/dL   Hgb urine dipstick TRACE (A) NEGATIVE   Bilirubin Urine NEGATIVE NEGATIVE   Ketones, ur NEGATIVE NEGATIVE mg/dL   Protein, ur NEGATIVE NEGATIVE mg/dL   Nitrite NEGATIVE NEGATIVE   Leukocytes, UA NEGATIVE NEGATIVE    Comment: Performed at [facility], [place] [address] [place], [place] [address]  Sodium, urine, random     Status: None   Collection Time: 11/11/17  8:18 AM  Result Value Ref Range   Sodium, Ur 29 mmol/L    Comment: Performed at [facility], [place] [address], [place], [place] [address]  Creatinine, urine, random     Status: None   Collection Time: 11/11/17  8:18 AM  Result Value Ref Range   Creatinine, Urine 17.41 mg/dL    Comment: Performed at [facility], [place] [address] [place], [place] [address]  Urinalysis, Microscopic (reflex)     Status: None   Collection Time: 11/11/17  8:18 AM  Result Value Ref Range   RBC / HPF NONE SEEN 0 - 5 RBC/hpf   WBC,  UA 0-5 0 - 5 WBC/hpf   Bacteria, UA NONE SEEN NONE SEEN   Squamous Epithelial / LPF NONE SEEN 0 - 5    Comment: Performed at [facility], [place] [address] [place], [place] [address]  Glucose, capillary     Status: Abnormal   Collection Time: 11/11/17 12:20 PM  Result Value Ref Range   Glucose-Capillary 128 (H) 65 - 99 mg/dL    Imaging / Studies: No results found.  Medications / Allergies: per chart  Antibiotics: Anti-infectives (From admission, onward)   Start     Dose/Rate Route Frequency Ordered Stop   11/09/17 0000  cefoTEtan (CEFOTAN) 2 g in sodium chloride 0.9 % 100 mL IVPB     2 g 200 mL/hr over 30 Minutes Intravenous Every 12 hours 11/08/17 1748 11/09/17 0021   11/08/17 1021  sodium chloride 0.9 % with cefoTEtan (CEFOTAN) ADS Med    Note to Pharmacy:  Marchia Meiers   : cabinet override      11/08/17 1021 11/08/17 2229   11/08/17 0600  cefoTEtan (CEFOTAN) 2 g in sodium chloride 0.9 % 100 mL IVPB  Status:  Discontinued    Note to Pharmacy:  Pharmacy may adjust dose strength for optimal dosing.   Send with patient on call to the OR.  Anesthesia to complete antibiotic administration <43mn [facility]   2 g 200 mL/hr over 30 Minutes Intravenous On call to  O.R. 11/07/17 1234 11/07/17 1433   11/08/17 0600  cefoTEtan in Dextrose 5% (CEFOTAN) IVPB 2 g     2 g Intravenous On call to O.R. 11/07/17 1505 11/08/17 1207   11/07/17 1500  cefoTEtan in Dextrose 5% (CEFOTAN) IVPB 2 g  Status:  Discontinued     2 g Intravenous On call to O.R. 11/07/17 1433 11/07/17 1505        Note: Portions of this report may have been transcribed using voice recognition software. Every effort was made to ensure accuracy; however, inadvertent computerized transcription errors may be present.   Any transcriptional errors that result from this process are unintentional.     Adin Hector, MD,FACS, [address] [place],  [place] [id number] Main: [place]: [phone]  11/11/2017

## 2017-11-11 NOTE — Progress Notes (Signed)
PROGRESS NOTE    Sonia Hill  LNL:[id number] DOB: 1955/06/18 DOA: 11/03/2017 PCP: [name], MD    Brief Narrative:  37 female-metastatic breast cancer (magrinat)-on Verzenio Colostomy + rectal mass 04/2017-complicated by SBO-recurrent SBO 06/2017 and currently on liquid diet at home  Admit severe excruciating abdominal pain no ostomy output CT = worsening cancer-blood pressures stable Main lab abnormalities potassium 2.5 calcium 5.6 glucose 55 treated initially No NG tube-General surgery and oncology have consulted on the patient It appears that patient may be going for exploratory surgery 5/28 and is n.p.o. 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  Patient subsequently underwent laparoscopic diagnostic small bowel resection, insertion of gastrostomy tube, lysis of adhesion, partial colectomy with new colostomy per Dr. Johney Maine 11/08/2017.  NG tube is been discontinued per general surgery.  Follow.   #2 hyponatremia Initially felt likely secondary to hypervolemic hyponatremia plus  or minus SIADH.  Patient noted to have lower extremity edema as well as low albumin levels.  Patient given IV Lasix however worsening sodium levels now at 123 from 127 from 129.  TSH within normal limits.  Cortisol level within normal limits.  Discontinue IV Lasix.  Consult with nephrology for further evaluation and management.   3.  Hypokalemia/hypocalcemia/hypomagnesemia Continue TPN.  Electrolytes being repleted per pharmacy.    4.  Hypothyroidism  TSH 3.123.  Continue IV Synthroid.  Once patient has been started on a diet and tolerating will transition to oral Synthroid.    5.  Metastatic estrogen positive lobular breast cancer Patient with metastatic spread to the spine and rectal area resulting in blockage and diverging colostomy.  Oncology following.   6.  Solitary kidney hydronephrosis Status post Foley catheter placement.  Renal function stable.  Outpatient follow-up.  7.  Anxiety Ativan as needed.  8 severe protein calorie malnutrition On TPN.  9.  Volume overload Patient with lower extremity edema.  Likely component of hypoalbuminemia.  Current weight is 123 pounds from 115 pounds from 114 pounds from 108 pounds on 11/04/2017.  Blood pressure somewhat borderline.  Increase Lasix to 40 mg IV every 12 hours.  Diuretics have been discontinued due to worsening sodium levels.  Strict I's and O's.  Daily weights.  Follow.    10.  New left bundle branch block Noted on EKG the night of 11/08/2017.  Patient denies any chest pain no shortness of breath.  2D echo with a EF of 65 to 70%, no wall motion abnormalities, left ventricular diastolic function normal.  Cardiology has assessed the patient and feel no further ischemic work-up needed at this time.  11.  Hiccups Likely secondary  to malignant bowel obstruction.  Patient on Reglan which has been placed 3 times daily at a lower dose per oncology.  Patient given a dose of Thorazine however was having's some side effects from it and as such  Thorazine has been discontinued.  Patient given Cogentin 1 mg IV every 12 hours x2 doses.       DVT prophylaxis: Lovenox. Code Status: Full Family Communication: Updated patient and family at bedside. Disposition Plan: To be determined.    Consultants:   General Surgery: [name] 11/04/2017  Palliative care: Dr. Rowe Pavy 10/30/2017  Cardiology: Dr.Skains 11/10/2017  Nephrology pending  Procedures:   CT abdomen and pelvis 11/03/2017  Abdominal films 11/07/2017, 11/05/2017, 11/02/2017  Laparoscopic diagnostic small bowel resection, insertion of gastrostomy tube, lysis of adhesion, partial colectomy with new colostomy per Dr. Johney Maine 11/08/2017  2D echo 11/10/2017  Antimicrobials:   None   Subjective: Patient sitting up in bed.  States she is feeling somewhat anxious/agitated.  Denies any shortness of breath.  Denies any chest pain.  Still with lower extremity edema.  Patient with some complaints of hiccups.  Objective: Vitals:   11/10/17 1623 11/10/17 2129 11/11/17 0500 11/11/17 0646  BP: 116/71 (!) 116/57  (!) 158/114  Pulse: (!) 114 (!) 102  (!) 112  Resp: 18 12  (!) 2  Temp: 98.2 F (36.8 C) 98.6 F (37 C)  97.7 F (36.5 C)  TempSrc: Oral Oral    SpO2: 100% 98%  100%  Weight:   55.8 kg (123 lb 1.6 oz)   Height:        Intake/Output Summary (Last 24 hours) at 11/11/2017 1214 Last data filed at 11/11/2017 0900 Gross per 24 hour  Intake 1090 ml  Output 402 ml  Net 688 ml   Filed Weights   11/09/17 0408 11/10/17 0500 11/11/17 0500  Weight: 51.9 kg (114 lb 6.7 oz) 52.2 kg (115 lb) 55.8 kg (123 lb 1.6 oz)    Examination:  General exam: NAD Respiratory system: Lungs CTAB. Decresed BS in bases. No wheezing, no rhonchi.  Respiratory effort normal. Cardiovascular system: Tachycardia.  2+ bilateral lower extremity edema. Gastrointestinal system: Abdomen is soft, hypoactive bowel sounds, nontender to palpation, infection place, nondistended, small brown stool noted in  colostomy bag.  Central nervous system: Alert and oriented. No focal neurological deficits. Extremities: 2+ bilateral lower extremity edema.  Symmetric 5 x 5 power. Skin: No rashes, lesions or ulcers Psychiatry: Judgement and insight appear normal. Mood & affect appropriate.     Data Reviewed: I have personally reviewed following labs and imaging studies  CBC: Recent Labs  Lab 11/05/17 0414 11/06/17 0421 11/07/17 0417 11/08/17 0424 11/09/17 0112 11/09/17 0440 11/10/17 0442  WBC 4.6 4.0 6.2 4.7  --  2.3* 7.7  NEUTROABS 3.4 2.9 5.0 3.4  --   --  7.0  HGB 11.2* 10.9* 12.0 11.7* 10.2* 10.2* 9.3*  HCT 33.4* 31.6* 35.5* 35.1* 30.4* 30.0* 27.4*  MCV 84.6 83.8 85.5 85.2  --  86.2 84.0  PLT 329 319 328 271  --  206 983   Basic Metabolic Panel: Recent Labs  Lab 11/07/17 0417 11/08/17 0424 11/09/17 0440 11/10/17 0442 11/11/17 0508  NA 130*  130* 127* 129* 127* 123*  K 4.0  4.0 3.9 3.9 3.7 4.1  CL 95*  94* 93* 100* 96* 93*  CO2 26  26 26 23 26 24   GLUCOSE 86  85 95 175* 108* 106*  BUN 7  7 11  21* 28* 31*  CREATININE 1.05*  0.97 1.02* 0.94 0.89 0.90  CALCIUM 8.9  8.9 8.8* 8.0* 8.4* 8.9  MG 1.7 2.1 1.8 2.1 2.0  PHOS 2.7 3.4 3.5 3.0 2.5   GFR: Estimated Creatinine Clearance: 50.7 mL/min (by C-G formula based on SCr of 0.9 mg/dL). Liver Function Tests: Recent Labs  Lab 11/05/17 0414 11/06/17 0421 11/07/17 0417 11/08/17 0424 11/09/17 0440  AST 25 26 29 31 25   ALT 10* 10* 9* 13* 8*  ALKPHOS 53 48 57 64 37*  BILITOT 0.5 0.4 0.6 0.3 0.1*  PROT 4.8* 4.4* 5.2* 5.2* 3.8*  ALBUMIN 2.7* 2.4*  2.5* 2.8*  2.8* 2.9* 2.3*   No results for input(s): LIPASE, AMYLASE in the last 168 hours. No results for input(s): AMMONIA in the last 168 hours. Coagulation Profile: No results for input(s): INR, PROTIME in the last 168 hours. Cardiac Enzymes: Recent Labs  Lab 11/08/17 2351  TROPONINI <0.03   BNP (last 3 results) No results for input(s): PROBNP in the last 8760  hours. HbA1C: No results for input(s): HGBA1C in the last 72 hours. CBG: Recent Labs  Lab 11/10/17 0634 11/10/17 1155 11/10/17 1731 11/10/17 2359 11/11/17 0641  GLUCAP 122* 127* 90 110* 92   Lipid Profile: No results for input(s): CHOL, HDL, LDLCALC, TRIG, CHOLHDL, LDLDIRECT in the last 72 hours. Thyroid Function Tests: No results for input(s): TSH, T4TOTAL, FREET4, T3FREE, THYROIDAB in the last 72 hours. Anemia Panel: No results for input(s): VITAMINB12, FOLATE, FERRITIN, TIBC, IRON, RETICCTPCT in the last 72 hours. Sepsis Labs: No results for input(s): PROCALCITON, LATICACIDVEN in the last 168 hours.  Recent Results (from the past 240 hour(s))  Urine culture     Status: Abnormal   Collection Time: 11/03/17  4:49 PM  Result Value Ref Range Status   Specimen Description   Final    URINE, CLEAN CATCH Performed at [facility], [place] [address] [place], [place] [address]    Special Requests   Final    NONE Performed at [facility], [place] [address], [place], [place] [address]    Culture MULTIPLE SPECIES PRESENT, SUGGEST RECOLLECTION (A)  Final   Report Status 11/04/2017 FINAL  Final  MRSA PCR Screening     Status: None   Collection Time: 11/07/17  2:43 PM  Result Value Ref Range Status   MRSA by PCR NEGATIVE NEGATIVE Final    Comment:        The GeneXpert MRSA Assay (FDA approved for NASAL specimens only), is one component of a comprehensive MRSA colonization surveillance program. It is not intended to diagnose MRSA infection nor to guide or monitor treatment for MRSA infections. Performed at [facility], [place] [address] [place], [place] [address]   Culture, Urine     Status: Abnormal   Collection Time: 11/08/17  9:54 AM  Result Value Ref Range Status   Specimen Description   Final    URINE, CLEAN CATCH Performed at [facility], [place] [address] [place], [place] [address]    Special  Requests   Final    NONE Performed at [facility], [place] [address], [place],  [address]    Culture (A)  Final    <10,000 COLONIES/mL INSIGNIFICANT GROWTH Performed at [employer] [address] [place],  [address]    Report Status 11/09/2017 FINAL  Final         Radiology Studies: No results found.      Scheduled  Meds: . enoxaparin (LOVENOX) injection  40 mg Subcutaneous QHS  . insulin aspart  0-9 Units Subcutaneous Q6H  . levothyroxine  50 mcg Intravenous Daily  . lip balm  1 application Topical BID  . LORazepam  0.5 mg Intravenous Q24H  . metoCLOPramide (REGLAN) injection  5 mg Intravenous TID AC & HS   Continuous Infusions: . acetaminophen    . chlorproMAZINE (THORAZINE) IV    . famotidine (PEPCID) IV Stopped (11/10/17 1250)  . Marland KitchenTPN (CLINIMIX-E) Adult     And  . Fat emulsion    . methocarbamol (ROBAXIN)  IV Stopped (11/11/17 0749)  . sodium chloride    . Marland KitchenTPN (CLINIMIX-E) Adult 75 mL/hr at 11/10/17 1812     LOS: 8 days    Time spent: 35 minutes    Irine Seal, MD [facility] Pager [phone]  If 7PM-7AM, please contact night-coverage [email or web address] Password [place] 11/11/2017, 12:14 PM

## 2017-11-11 NOTE — Progress Notes (Signed)
Sonia Hill   DOB:19-Mar-1956   BT#:[id number]   XHF#:[id number]  Subjective:  Having constant hiccoughs; nauseated since receiving dilaudid some hours ago; no colostomy output but some "burping" from colostomy; too clear broth last night and didn't go well--plans to skip AM clear liquids and try again at noon; husband in room  Objective: middle aged White woman examined in bed Vitals:   11/10/17 2129 11/11/17 0646  BP: (!) 116/57 (!) 158/114  Pulse: (!) 102 (!) 112  Resp: 12 (!) 2  Temp: 98.6 F (37 C) 97.7 F (36.5 C)  SpO2: 98% 100%    Body mass index is 24.04 kg/m.  Intake/Output Summary (Last 24 hours) at 11/11/2017 0737 Last data filed at 11/11/2017 0700 Gross per 24 hour  Intake 1090 ml  Output 202 ml  Net 888 ml     Sclerae unicteric Sclerae unicteric, EOMs intact Oropharynx clear  Lungs no rales or rhonchi--auscultated anterolaterally Heart regular rate and rhythm Abd soft, nontender, no bowel sounds--PEG, woundvac intact, colostomy bag flat Neuro: nonfocal Breasts: Deferred   CBG (last 3)  Recent Labs    11/10/17 1731 11/10/17 2359 11/11/17 0641  GLUCAP 90 110* 92     Labs:  Lab Results  Component Value Date   WBC 7.7 11/10/2017   HGB 9.3 (L) 11/10/2017   HCT 27.4 (L) 11/10/2017   MCV 84.0 11/10/2017   PLT 199 11/10/2017   NEUTROABS 7.0 11/10/2017    _0 @  Urine Studies No results for input(s): UHGB, CRYS in the last 72 hours.  Invalid input(s): UACOL, UAPR, USPG, UPH, UTP, UGL, UKET, UBIL, UNIT, UROB, [place], UEPI, UWBC, [place], [place], [place], [place], [place]  Basic Metabolic Panel: Recent Labs  Lab 11/07/17 0417 11/08/17 0424 11/09/17 0440 11/10/17 0442 11/11/17 0508  NA 130*  130* 127* 129* 127* 123*  K 4.0  4.0 3.9 3.9 3.7 4.1  CL 95*  94* 93* 100* 96* 93*  CO2 _1 GLUCOSE 86  85 95 175* 108* 106*  BUN _2 21* 28* 31*  CREATININE 1.05*  0.97 1.02* 0.94 0.89 0.90  CALCIUM 8.9  8.9 8.8* 8.0* 8.4* 8.9  MG  1.7 2.1 1.8 2.1 2.0  PHOS 2.7 3.4 3.5 3.0 2.5   GFR Estimated Creatinine Clearance: 50.7 mL/min (by C-G formula based on SCr of 0.9 mg/dL). Liver Function Tests: Recent Labs  Lab 11/05/17 0414 11/06/17 0421 11/07/17 0417 11/08/17 0424 11/09/17 0440  AST _3 ALT 10* 10* 9* 13* 8*  ALKPHOS 53 48 57 64 37*  BILITOT 0.5 0.4 0.6 0.3 0.1*  PROT 4.8* 4.4* 5.2* 5.2* 3.8*  ALBUMIN 2.7* 2.4*  2.5* 2.8*  2.8* 2.9* 2.3*   No results for input(s): LIPASE, AMYLASE in the last 168 hours. No results for input(s): AMMONIA in the last 168 hours. Coagulation profile No results for input(s): INR, PROTIME in the last 168 hours.  CBC: Recent Labs  Lab 11/05/17 0414 11/06/17 0421 11/07/17 0417 11/08/17 0424 11/09/17 0112 11/09/17 0440 11/10/17 0442  WBC 4.6 4.0 6.2 4.7  --  2.3* 7.7  NEUTROABS 3.4 2.9 5.0 3.4  --   --  7.0  HGB 11.2* 10.9* 12.0 11.7* 10.2* 10.2* 9.3*  HCT 33.4* 31.6* 35.5* 35.1* 30.4* 30.0* 27.4*  MCV 84.6 83.8 85.5 85.2  --  86.2 84.0  PLT 329 319 328 271  --  206 199   Cardiac Enzymes: Recent Labs  Lab 11/08/17  [place] <0.03   BNP: Invalid input(s): POCBNP CBG: Recent Labs  Lab 11/10/17 0634 11/10/17 1155 11/10/17 1731 11/10/17 2359 11/11/17 0641  GLUCAP 122* 127* 90 110* 92   D-Dimer No results for input(s): DDIMER in the last 72 hours. Hgb A1c No results for input(s): HGBA1C in the last 72 hours. Lipid Profile No results for input(s): CHOL, HDL, LDLCALC, TRIG, CHOLHDL, LDLDIRECT in the last 72 hours. Thyroid function studies Recent Labs    11/08/17 0930  TSH 3.123   Anemia work up No results for input(s): VITAMINB12, FOLATE, FERRITIN, TIBC, IRON, RETICCTPCT in the last 72 hours. Microbiology Recent Results (from the past 240 hour(s))  Urine culture     Status: Abnormal   Collection Time: 11/03/17  4:49 PM  Result Value Ref [place] Status   Specimen Description   Final    URINE, CLEAN CATCH Performed at [facility], [place] [address] [place], [place] [address]    Special Requests   Final    NONE Performed at [facility], [place] [address] [place], [place] [address]    Culture MULTIPLE SPECIES PRESENT, SUGGEST RECOLLECTION (A)  Final   Report Status 11/04/2017 FINAL  Final  MRSA PCR Screening     Status: None   Collection Time: 11/07/17  2:43 PM  Result Value Ref [place] Status   MRSA by PCR NEGATIVE NEGATIVE Final    Comment:        The GeneXpert MRSA Assay (FDA approved for NASAL specimens only), is one component of a comprehensive MRSA colonization surveillance program. It is not intended to diagnose MRSA infection nor to guide or monitor treatment for MRSA infections. Performed at [facility], [place] [address] [place], [place] [address]   Culture, Urine     Status: Abnormal   Collection Time: 11/08/17  9:54 AM  Result Value Ref [place] Status   Specimen Description   Final    URINE, CLEAN CATCH Performed at [facility], [place] [address] [place], [place] [address]    Special Requests   Final    NONE Performed at [facility], [place] [address], [place], [place] [address]    Culture (A)  Final    <10,000 COLONIES/mL INSIGNIFICANT GROWTH Performed at [place] [address], [place], [place] [address]    Report Status 11/09/2017 FINAL  Final      Studies:  Ct Abdomen Pelvis Wo Contrast  Result Date: 11/03/2017 CLINICAL DATA:  Abdominal distension with decreased ostomy output EXAM: CT ABDOMEN AND PELVIS WITHOUT CONTRAST TECHNIQUE: Multidetector CT imaging of the abdomen and pelvis was performed following the standard protocol without IV contrast. COMPARISON:  CT 07/10/2017, 05/22/2017, radiograph 11/02/2017 FINDINGS: Lower chest: Lung bases demonstrate small bilateral pleural effusions. No consolidation. Normal heart size. Hepatobiliary: No focal liver abnormality is seen. No  gallstones, gallbladder wall thickening, or biliary dilatation. Pancreas: Unremarkable. No pancreatic ductal dilatation or surrounding inflammatory changes. Spleen: Normal in size without focal abnormality. Adrenals/Urinary Tract: Right adrenal gland is normal. Mild to moderate right hydronephrosis and hydroureter as before. Absent left kidney. Bladder within normal limits. Stomach/Bowel: Mild-to-moderate fluid distention of stomach. Multiple dilated loops of proximal to mid small bowel, measuring up to 4.5 cm. Abduct transition to decompressed distal small bowel in the right mid to lower abdomen, series 2, image number 43, with minimal contrast within decompressed small bowel loops distal to this. No appreciable contrast in the colon. Status post  left lower quadrant double-barrel colostomy. Heterogeneous soft tissue thickening at the anus and rectum, corresponding to previously noted mass. Vascular/Lymphatic: Nonaneurysmal aorta. Limited evaluation for adenopathy due to diffuse abdominal fluid. Reproductive: No adnexal mass. Uterus inseparable from soft tissue mass at the rectum and anus. Other: No free air. Increased abdominal and pelvic ascites, now moderate. Diffuse subcutaneous edema. Musculoskeletal: No acute or suspicious abnormality. IMPRESSION: 1. Multiple dilated loops of small bowel with abduct transition to decompressed distal small bowel in the mid to lower abdomen slightly to the right of midline, consistent with small bowel obstruction. A small amount of contrast does pass into decompressed small bowel distal to the point of obstruction. 2. Interim development of small pleural effusions. Development of moderate abdominopelvic ascites. Diffuse subcutaneous edema consistent with anasarca 3. Status post left lower quadrant colostomy. Soft tissue enlargement of the anus and rectum, corresponding to previously demonstrated infiltrative mass in the region. 4. Similar mild to moderate right hydronephrosis  and slightly dilated right ureter. Electronically Signed   By: Donavan Foil M.D.   On: 11/03/2017 21:41   Dg Abd 1 View  Result Date: 11/03/2017 CLINICAL DATA:  Small-bowel obstruction EXAM: ABDOMEN - 1 VIEW COMPARISON:  07/14/2017 FINDINGS: Moderate small bowel dilatation. Colon decompressed. Left lower quadrant ostomy. No renal calculi. Normal skeletal structures. IMPRESSION: Small-bowel obstruction. Electronically Signed   By: Franchot Gallo M.D.   On: 11/03/2017 08:44   US Renal  Result Date: 10/20/2017 CLINICAL DATA:  62 year old female with a history of hydronephrosis. Left-sided nephrectomy EXAM: RENAL / URINARY TRACT ULTRASOUND COMPLETE COMPARISON:  CT 07/10/2017, MR 10/05/2017 FINDINGS: Right Kidney: Length: 11.8 cm. Hydronephrosis. No significant renal cortical thinning. Echogenicity is similar to that of the adjacent liver parenchyma. Left Kidney: Left nephrectomy Bladder: Urinary bladder is decompressed. IMPRESSION: Right-sided hydronephrosis. Left nephrectomy. Electronically Signed   By: Corrie Mckusick D.O.   On: 10/20/2017 09:33   Dg Abd 2 Views  Result Date: 11/07/2017 CLINICAL DATA:  62 year old female with small bowel obstruction. Recent colonic surgery. Subsequent encounter. EXAM: ABDOMEN - 2 VIEW COMPARISON:  11/05/2017 plain film examination.  11/03/2017 CT. FINDINGS: Gas and fluid distended dilated small bowel loops more prominent than on the recent plain film examination with small bowel measuring up to 4.7 cm versus prior 4.5 cm. Small bowel fold thickening. Air-fluid levels. Interval development of gas filled stomach. Minimal residual contrast within colon. Colostomy left lower quadrant. No free intraperitoneal air noted. IMPRESSION: Progressive partial small bowel obstructive pattern. Interval development of gas-filled stomach. No free intraperitoneal air. These results will be called to the ordering clinician or representative by the Radiologist Assistant, and communication  documented in the PACS or zVision Dashboard. Electronically Signed   By: Genia Del M.D.   On: 11/07/2017 09:05   Dg Abd 2 Views  Result Date: 11/05/2017 CLINICAL DATA:  Per order- small bowel obstruction PT HX: GERD, bladder sugery EXAM: ABDOMEN - 2 VIEW COMPARISON:  CT 11/03/2017 and previous FINDINGS: Visualized lung bases clear.  No free air. Multiple dilated small bowel loops with fluid levels on the erect radiograph. Similar number of involved loops and degree of dilatation since previous radiograph 11/02/2017. There has been progression of oral contrast material into the proximal colon which is nondilated. There is a left lower quadrant ostomy device. No abnormal abdominal calcifications.  Regional bones unremarkable. IMPRESSION: 1. Findings consistent with persistent partial mid/distal small bowel obstruction. 2. No free air. Electronically Signed   By: Lucrezia Europe M.D.   On:  11/05/2017 12:23   [place] Ekg Site Rite  Result Date: 11/04/2017 If Site Rite image not attached, placement could not be confirmed due to current cardiac rhythm.   Assessment: 62 y.o.  BRCA 1-2 negative [facility] woman with lobular breast cancer stage IV at presentation July 2011, admitted with recurren SBO in the setting of abdominal carcinomatosis  (1) status post left mastectomy and sentinel lymph node sampling in July 2011 for a lower inner quadrant T1 N1 M1, stage IV invasive lobular breast cancer, grade 1, strongly estrogen and progesterone receptor-positive, HER2 negative with MIB-1 of 9% and no HER2 amplification,   (2) with multiple sclerotic bone lesions at presentation seen only on CT scan (not on bone scan or PET scan), but  with biopsy-proven metastatic disease to bone; and an elevated CA 27.29 at presentation,   (3) Oncotype recurrence score of 4, predicts a good response to antiestrogens.  (4) Systemic treatment has consisted of             a) tamoxifen with evidence of response but poor tolerance              b) letrozole starting August 2012, discontinued October 2014 per patient              (5) single functioning kidney  (6) status post bilateral salpingo-oophorectomy 01/24/2013, with benign pathology  (7) osteoporosis; the patient refuses bisphosphonate therapy; started [facility] February 2014.               (a) bone density was obtained under the care of Dr. Matthew Saras at [facility], 06/25/2012, showing osteoporosis with a T score -2.6. This was repeated 12/20/2013, showing again osteoporosis with T-scores between -2.4 and -2.8.             (b) the patient refuses zolendronate, denosumab, or other pharmacologic intervention             (c) bone density on 12/30/2015 under Dr. Matthew Saras showing osteoporosis with T score -3.3  (8) the patient refuses standard Mammography or tomography; undergoing thermography screening of the right breast.  (9) PET scan 06/05/2015 shows rectal thickening and a presacral mass; biopsy of this area 10/09/2015 confirms metastatic lobular breast cancer, again estrogen receptor positive, HER-2 nonamplified.             (a) pelvic MRI 03/11/2016 confirms stability of disease             (b) chest CT and pelvic MRI 09/02/2016 shows no change in the circumferential rectal thickening or evidence of extension beyond the serosa and chest CT findings             (c) colonoscopy 02/23/2017 showed a very narrow rectal lumen with a few apparently uninvolved centimeters distally              (d) CT of the abdomen and pelvis 05/22/2017 (after diverting colostomy) shows interval increase in the size of the perirectal soft tissue masses.  (10) started fulvestrant and palbociclib 125 mg/ day [21/7] May 2017             (a) palbociclib dose decreased to 100 mg daily [21/7] with second cycle, started 11/25/2015             (b) palbociclib discontinued 03/20/2017 with evidence of disease progression             (c) last fulvestrant dose 03/24/2017,  discontinued with evidence of progression  (11) status post [facility] placement  [facility] [place] 2018  (12) started exemestane 07/05/2017,             (a) everolimus 5 mg/d started 07/14/2017--held as of 08/20/2017 secondary to rash             (b) exemestane stopped by patient 09/04/2017 to try "the natural way".  (13) exemestane resumed 10/16/2017  (a) abemaciclib/Verzenio added 10/18/2017  (14) s/p laparoscopic small bowel resection for SBO 11/08/2017    Plan:  Sonia Hill is feeling down this AM due to hiccoughs, nausea. She tolerated narcotics poorly and unfortunately given concerns re single kidney we are avoiding NSAIDS; Rewrote for acetaminophen, made metoclopramide TIDAC at lower dose and encouraged patient to sit up in recliner--she is slumped in bed which does not help hiccoughs or nausea.  Not taking po's well yet so not ready to resume breast cancer pills.  Will follow with you    Chauncey Cruel, MD 11/11/2017  7:37 AM Medical Oncology and Hematology [facility] [address] [place], [place] [address] Tel. [phone]    Fax. [phone]

## 2017-11-11 NOTE — Progress Notes (Signed)
[place] NOTE   Pharmacy Consult for TPN Indication: Intolerance to enteral feeding    Patient Measurements: Height: 5' (152.4 cm) Weight: 123 lb 1.6 oz (55.8 kg) IBW/kg (Calculated) : 45.5 TPN AdjBW (KG): 49 Body mass index is 24.04 kg/m.   Insulin Requirements: 1 units yesterday   Current Nutrition: NPO, ice chips  IVF: none ordered  Central access: PICC 5/25 TPN start date: 5/28  ASSESSMENT                                                                                                          HPI:  Patient has metastatic breast cancer and partial small bowel obstruction on full liquids for 4 months, now intolerance to clear liquids.   Significant events:  5/29 surgery 5/31 advanced to [facility] but did not tolerate Today:    Glucose - at goal of <150  Electrolytes -Na low at 123, Phos 2.5, K 4.1 (goal 4.0), Mag 2.0 at goal  Renal - Scr 0.9, CrCl ~ 50  LFTs - WNL/low  TGs - 152  Prealbumin - 10.7  NUTRITIONAL GOALS                                                                                             RD recs: 5/30 1700-1800 Kcal and 85-95 gm protein Clinimix E 5/15 at a goal rate of 75 ml/hr + 20% fat emulsion at 51ml/hr to provide: 90 g/day protein, 1758 Kcal/day.  PLAN      KCl 80meq IV x 2                                                                                                     At 1800 today:  continue Clinimix E 5/15 at 75 ml/hr.to meet 100% of goals  20% fat emulsion at 18ml/hr.  TPN to contain standard multivitamins and trace elements.  Continue q6h sensitive SSI .   TPN lab panels on Mondays & Thursdays.  F/u daily.  Dolly Rias RPh 11/11/2017, 9:18 AM Pager [phone]

## 2017-11-12 DIAGNOSIS — D649 Anemia, unspecified: Secondary | ICD-10-CM | POA: Diagnosis not present

## 2017-11-12 DIAGNOSIS — R451 Restlessness and agitation: Secondary | ICD-10-CM

## 2017-11-12 LAB — GLUCOSE, CAPILLARY
GLUCOSE-CAPILLARY: 88 mg/dL (ref 65–99)
GLUCOSE-CAPILLARY: 81 mg/dL (ref 65–99)
Glucose-Capillary: 105 mg/dL — ABNORMAL HIGH (ref 65–99)
Glucose-Capillary: 116 mg/dL — ABNORMAL HIGH (ref 65–99)

## 2017-11-12 LAB — IRON AND TIBC
Saturation Ratios: 4 % — ABNORMAL LOW (ref 10.4–31.8)
TIBC: 89 ug/dL — AB (ref 250–450)
UIBC: 85 ug/dL

## 2017-11-12 LAB — HEPATIC FUNCTION PANEL
ALBUMIN: 3.7 g/dL (ref 3.5–5.0)
ALK PHOS: 79 U/L (ref 38–126)
ALT: 11 U/L — AB (ref 14–54)
AST: 36 U/L (ref 15–41)
Bilirubin, Direct: 0.3 mg/dL (ref 0.1–0.5)
Indirect Bilirubin: 0.5 mg/dL (ref 0.3–0.9)
TOTAL PROTEIN: 5.2 g/dL — AB (ref 6.5–8.1)
Total Bilirubin: 0.8 mg/dL (ref 0.3–1.2)

## 2017-11-12 LAB — BASIC METABOLIC PANEL
ANION GAP: 9 (ref 5–15)
BUN: 29 mg/dL — AB (ref 6–20)
CO2: 24 mmol/L (ref 22–32)
Calcium: 9.2 mg/dL (ref 8.9–10.3)
Chloride: 92 mmol/L — ABNORMAL LOW (ref 101–111)
Creatinine, Ser: 0.7 mg/dL (ref 0.44–1.00)
GFR calc Af Amer: >60 mL/min (ref >60)
GLUCOSE: 88 mg/dL (ref 65–99)
POTASSIUM: 3.9 mmol/L (ref 3.5–5.1)
Sodium: 125 mmol/L — ABNORMAL LOW (ref 135–145)

## 2017-11-12 LAB — RETICULOCYTES
RBC.: 2.24 MIL/uL — ABNORMAL LOW (ref 3.87–5.11)
RETIC COUNT ABSOLUTE: 17.9 10*3/uL — AB (ref 19.0–186.0)
Retic Ct Pct: 0.8 % (ref 0.4–3.1)

## 2017-11-12 LAB — CBC
HEMATOCRIT: 19.2 % — AB (ref 36.0–46.0)
Hemoglobin: 7.1 g/dL — ABNORMAL LOW (ref 12.0–15.0)
MCH: 29.6 pg (ref 26.0–34.0)
MCHC: 37 g/dL — ABNORMAL HIGH (ref 30.0–36.0)
MCV: 80 fL (ref 78.0–100.0)
Platelets: 179 10*3/uL (ref 150–400)
RBC: 2.4 MIL/uL — AB (ref 3.87–5.11)
RDW: 14 % (ref 11.5–15.5)
WBC: 4.1 10*3/uL (ref 4.0–10.5)

## 2017-11-12 LAB — FOLATE: Folate: 7.3 ng/mL (ref >5.9)

## 2017-11-12 LAB — HEMOGLOBIN AND HEMATOCRIT, BLOOD
HEMATOCRIT: 18.1 % — AB (ref 36.0–46.0)
HEMOGLOBIN: 6.6 g/dL — AB (ref 12.0–15.0)

## 2017-11-12 LAB — OCCULT BLOOD X 1 CARD TO LAB, STOOL: FECAL OCCULT BLD: POSITIVE — AB

## 2017-11-12 LAB — PREPARE RBC (CROSSMATCH)

## 2017-11-12 LAB — LACTATE DEHYDROGENASE: LDH: 186 U/L (ref 98–192)

## 2017-11-12 LAB — VITAMIN B12: VITAMIN B 12: 428 pg/mL (ref 180–914)

## 2017-11-12 LAB — FERRITIN: FERRITIN: 94 ng/mL (ref 11–307)

## 2017-11-12 MED ORDER — DIPHENHYDRAMINE HCL 50 MG/ML IJ SOLN
25.0000 mg | Freq: Once | INTRAMUSCULAR | Status: AC
  Administered 2017-11-12: 25 mg via INTRAVENOUS
  Filled 2017-11-12: qty 1

## 2017-11-12 MED ORDER — ACETAMINOPHEN 10 MG/ML IV SOLN
1000.0000 mg | Freq: Four times a day (QID) | INTRAVENOUS | Status: AC | PRN
  Administered 2017-11-12: 1000 mg via INTRAVENOUS
  Filled 2017-11-12: qty 100

## 2017-11-12 MED ORDER — TRACE MINERALS CR-CU-MN-SE-ZN 10-1000-500-60 MCG/ML IV SOLN
INTRAVENOUS | Status: AC
  Administered 2017-11-12: 18:00:00 via INTRAVENOUS
  Filled 2017-11-12: qty 1800

## 2017-11-12 MED ORDER — ACETAMINOPHEN 325 MG PO TABS
650.0000 mg | ORAL_TABLET | Freq: Once | ORAL | Status: DC
  Filled 2017-11-12: qty 2

## 2017-11-12 MED ORDER — FUROSEMIDE 10 MG/ML IJ SOLN
40.0000 mg | Freq: Once | INTRAMUSCULAR | Status: AC
  Administered 2017-11-12: 40 mg via INTRAVENOUS
  Filled 2017-11-12: qty 4

## 2017-11-12 MED ORDER — FAMOTIDINE IN NACL 20-0.9 MG/50ML-% IV SOLN
20.0000 mg | Freq: Two times a day (BID) | INTRAVENOUS | Status: DC
  Administered 2017-11-12 – 2017-11-15 (×5): 20 mg via INTRAVENOUS
  Filled 2017-11-12 (×7): qty 50

## 2017-11-12 MED ORDER — FAT EMULSION PLANT BASED 20 % IV EMUL
240.0000 mL | INTRAVENOUS | Status: AC
  Administered 2017-11-12: 240 mL via INTRAVENOUS
  Filled 2017-11-12: qty 250

## 2017-11-12 MED ORDER — DIPHENHYDRAMINE HCL 25 MG PO CAPS
25.0000 mg | ORAL_CAPSULE | Freq: Once | ORAL | Status: DC
  Filled 2017-11-12: qty 1

## 2017-11-12 MED ORDER — FUROSEMIDE 10 MG/ML IJ SOLN
20.0000 mg | Freq: Two times a day (BID) | INTRAMUSCULAR | Status: DC
  Administered 2017-11-12 – 2017-11-13 (×2): 20 mg via INTRAVENOUS
  Filled 2017-11-12 (×2): qty 2

## 2017-11-12 MED ORDER — SODIUM CHLORIDE 0.9 % IV SOLN
510.0000 mg | Freq: Once | INTRAVENOUS | Status: AC
  Administered 2017-11-13: 510 mg via INTRAVENOUS
  Filled 2017-11-12: qty 17

## 2017-11-12 MED ORDER — SODIUM CHLORIDE 0.9 % IV SOLN
Freq: Once | INTRAVENOUS | Status: AC
  Administered 2017-11-12 – 2017-11-13 (×2): via INTRAVENOUS

## 2017-11-12 MED ORDER — ACETAMINOPHEN 650 MG RE SUPP
325.0000 mg | Freq: Once | RECTAL | Status: AC
  Administered 2017-11-12: 325 mg via RECTAL
  Filled 2017-11-12: qty 1

## 2017-11-12 NOTE — Progress Notes (Signed)
[place] NOTE   Pharmacy Consult for TPN Indication: Intolerance to enteral feeding    Patient Measurements: Height: 5' (152.4 cm) Weight: 130 lb 8.2 oz (59.2 kg) IBW/kg (Calculated) : 45.5 TPN AdjBW (KG): 49 Body mass index is 25.49 kg/m.   Insulin Requirements: 1 units yesterday   Current Nutrition: NPO, ice chips  IVF: none ordered  Central access: PICC 5/25 TPN start date: 5/28  ASSESSMENT                                                                                                          HPI:  Patient has metastatic breast cancer and partial small bowel obstruction on full liquids for 4 months, now intolerance to clear liquids.   Significant events:  5/29 surgery 5/31 advanced to [facility] but did not tolerate Today:    Glucose - at goal of <150  Electrolytes -Na low at 125 (see nephrologist note),other lytes WNL  Renal - WNL  LFTs - WNL/low  TGs - 152  Prealbumin - 10.7  NUTRITIONAL GOALS                                                                                             RD recs: 5/30 1700-1800 Kcal and 85-95 gm protein Clinimix E 5/15 at a goal rate of 75 ml/hr + 20% fat emulsion at 35ml/hr to provide: 90 g/day protein, 1758 Kcal/day.  PLAN      KCl 44meq IV x 2                                                                                                     At 1800 today:  continue Clinimix E 5/15 at 75 ml/hr.to meet 100% of goals  20% fat emulsion at 73ml/hr.  TPN to contain standard multivitamins and trace elements.  Continue q6h sensitive SSI .   TPN lab panels on Mondays & Thursdays.  F/u daily.  Dolly Rias RPh 11/12/2017, 8:50 AM Pager [phone]

## 2017-11-12 NOTE — Progress Notes (Signed)
[facility] Progress Note  Subjective: no new c/o, having a better day.  Na up 125 today, creat down 0.7  Vitals:   11/12/17 0500 11/12/17 0541 11/12/17 1508 11/12/17 1515  BP:  (!) 119/59 138/65 137/63  Pulse:  (!) 126 (!) 113 (!) 110  Resp:  16 16 16   Temp:  99.2 F (37.3 C) 98.2 F (36.8 C) 98.6 F (37 C)  TempSrc:  Oral Oral Oral  SpO2:  93% 100% 100%  Weight: 59.2 kg (130 lb 8.2 oz)     Height:        Inpatient medications: . insulin aspart  0-9 Units Subcutaneous Q6H  . levothyroxine  50 mcg Intravenous Daily  . lip balm  1 application Topical BID  . LORazepam  0.5 mg Intravenous Q24H  . metoCLOPramide (REGLAN) injection  5 mg Intravenous TID AC & HS  . sodium chloride  2 g Oral BID WC   . acetaminophen    . albumin human    . famotidine (PEPCID) IV    . Marland KitchenTPN (CLINIMIX-E) Adult 75 mL/hr at 11/12/17 1807   And  . Fat emulsion 240 mL (11/12/17 1807)  . ferumoxytol    . methocarbamol (ROBAXIN)  IV 500 mg (11/12/17 1818)  . sodium chloride     acetaminophen, albuterol, alum & mag hydroxide-simeth, diphenhydrAMINE, guaiFENesin-dextromethorphan, hydrALAZINE, hydrocortisone, hydrocortisone cream, HYDROmorphone (DILAUDID) injection, LORazepam, magic mouthwash, menthol-cetylpyridinium, metoprolol tartrate, ondansetron **OR** ondansetron (ZOFRAN) IV, phenol, prochlorperazine, sodium chloride flush  Exam: Gen small framed wf no distress No jvd or bruits Chest clear bilat to bases no wheezing or rales RRR hyperdynanmic precordium, no S3 no murmur Abd mod abd wall edema, midline wound w/ G-tube in place +bs Ext 2-3+ diffuse bilat leg edema up to the hips Neuro is alert, nonfocal   ua 6/1 > negative  urine sodium = 29  urine creat = 17   urine osm = 247  echo on 5/31 = normal EF no wma  I/o = 15L in and 7.5 L out = +7.5 L  weights 49kg on admit >> 56 kg on 6/1     Impression: 1  Hyponatremia -in patient w/ anasarca of LE's and peritoneal  carcinomatosis. Difficult situation. Na up a little. Cont IV albumin, salt tabs and IV lasix, compression stockings. CXR was clear. 2   Anasarca - due to low albumin and prob lymphedema from #5 3   Solitary R kidney - with hydronephrosis stable since [name] 2019, consider urology eval for this 4   Breast cancer/ stage IV 5   Peritoneal carcinomatosis 6   SBO - sp exlap 5/29 by gen surg with LOA/ small bowel resection/ redo of colostomy w/ partial colectomy   Plan - as above   Kelly Splinter MD [facility] pager [phone]   11/12/2017, 6:23 PM   Recent Labs  Lab 11/09/17 0440 11/10/17 0442 11/11/17 0508 11/12/17 0418  NA 129* 127* 123* 125*  K 3.9 3.7 4.1 3.9  CL 100* 96* 93* 92*  CO2 23 26 24 24   GLUCOSE 175* 108* 106* 88  BUN 21* 28* 31* 29*  CREATININE 0.94 0.89 0.90 0.70  CALCIUM 8.0* 8.4* 8.9 9.2  PHOS 3.5 3.0 2.5  --    Recent Labs  Lab 11/08/17 0424 11/09/17 0440 11/12/17 1353  AST 31 25 36  ALT 13* 8* 11*  ALKPHOS 64 37* 79  BILITOT 0.3 0.1* 0.8  PROT 5.2* 3.8* 5.2*  ALBUMIN 2.9* 2.3* 3.7  Recent Labs  Lab 11/07/17 0417 11/08/17 0424  11/09/17 0440 11/10/17 0442 11/12/17 0418 11/12/17 1114  WBC 6.2 4.7  --  2.3* 7.7 4.1  --   NEUTROABS 5.0 3.4  --   --  7.0  --   --   HGB 12.0 11.7*   < > 10.2* 9.3* 7.1* 6.6*  HCT 35.5* 35.1*   < > 30.0* 27.4* 19.2* 18.1*  MCV 85.5 85.2  --  86.2 84.0 80.0  --   PLT 328 271  --  206 199 179  --    < > = values in this interval not displayed.   Iron/TIBC/Ferritin/ %Sat    Component Value Date/Time   IRON <5 (L) 11/12/2017 1114   TIBC 89 (L) 11/12/2017 1114   FERRITIN 94 11/12/2017 1114   IRONPCTSAT 4 (L) 11/12/2017 1114

## 2017-11-12 NOTE — Progress Notes (Signed)
4 Days Post-Op   Subjective/Chief Complaint: PT FEELS BETTER THORAZINE CAUSED SOME ISSUES YESTERDAY  NO HICCUPS    Objective: Vital signs in last 24 hours: Temp:  [97.8 F (36.6 C)-99.2 F (37.3 C)] 99.2 F (37.3 C) (06/02 0541) Pulse Rate:  [118-133] 126 (06/02 0541) Resp:  [14-20] 16 (06/02 0541) BP: (110-130)/(59-79) 119/59 (06/02 0541) SpO2:  [93 %-100 %] 93 % (06/02 0541) Weight:  [59.2 kg (130 lb 8.2 oz)] 59.2 kg (130 lb 8.2 oz) (06/02 0500) Last BM Date: 11/11/17  Intake/Output from previous day: 06/01 0701 - 06/02 0700 In: 3575.9 [P.O.:30; I.V.:2710.9; IV Piggyback:835] Out: 250 [Urine:200; Drains:50] Intake/Output this shift: Total I/O In: 0  Out: 500 [Urine:300; Stool:200]  Incision/Wound:VAC IN PLACE  GT DRAINING  ND    Lab Results:  Recent Labs    11/10/17 0442 11/12/17 0418  WBC 7.7 4.1  HGB 9.3* 7.1*  HCT 27.4* 19.2*  PLT 199 179   BMET Recent Labs    11/11/17 0508 11/12/17 0418  NA 123* 125*  K 4.1 3.9  CL 93* 92*  CO2 24 24  GLUCOSE 106* 88  BUN 31* 29*  CREATININE 0.90 0.70  CALCIUM 8.9 9.2   PT/INR No results for input(s): LABPROT, INR in the last 72 hours. ABG No results for input(s): PHART, HCO3 in the last 72 hours.  Invalid input(s): PCO2, PO2  Studies/Results: Dg Chest Port 1 View  Result Date: 11/11/2017 CLINICAL DATA:  Acute kidney injury.  Chest pain. EXAM: PORTABLE CHEST 1 VIEW COMPARISON:  10/31/2016 FINDINGS: Right arm PICC tip in the SVC 4 cm above the right atrium. Bilateral pleural effusions with dependent atelectasis. Upper lungs are clear. Previous mastectomy on the left. No acute bone finding. IMPRESSION: Bilateral effusions with dependent pulmonary atelectasis. Electronically Signed   By: Nelson Chimes M.D.   On: 11/11/2017 15:17    Anti-infectives: Anti-infectives (From admission, onward)   Start     Dose/Rate Route Frequency Ordered Stop   11/09/17 0000  cefoTEtan (CEFOTAN) 2 g in sodium chloride 0.9 % 100  mL IVPB     2 g 200 mL/hr over 30 Minutes Intravenous Every 12 hours 11/08/17 1748 11/09/17 0021   11/08/17 1021  sodium chloride 0.9 % with cefoTEtan (CEFOTAN) ADS Med    Note to Pharmacy:  Marchia Meiers   : cabinet override      11/08/17 1021 11/08/17 2229   11/08/17 0600  cefoTEtan (CEFOTAN) 2 g in sodium chloride 0.9 % 100 mL IVPB  Status:  Discontinued    Note to Pharmacy:  Pharmacy may adjust dose strength for optimal dosing.   Send with patient on call to the OR.  Anesthesia to complete antibiotic administration <67min prior to incision per [facility].   2 g 200 mL/hr over 30 Minutes Intravenous On call to O.R. 11/07/17 1234 11/07/17 1433   11/08/17 0600  cefoTEtan in Dextrose 5% (CEFOTAN) IVPB 2 g     2 g Intravenous On call to O.R. 11/07/17 1505 11/08/17 1207   11/07/17 1500  cefoTEtan in Dextrose 5% (CEFOTAN) IVPB 2 g  Status:  Discontinued     2 g Intravenous On call to O.R. 11/07/17 1433 11/07/17 1505      Assessment/Plan: s/p Procedure(s): LAPAROSCOPY DIAGNOSTIC (N/A) SMALL BOWEL RESECTION (N/A) INSERTION OF GASTROSTOMY TUBE (N/A) LYSIS OF ADHESION (N/A) PARTIAL COLECTOMY WITH NEW COLOSTOMY (N/A)        metastatic lobular carcinoma of the breasts/p left mastectomy and sentinel node biopsy in  2011; metastasis to pelvis last year s/p laparoscopic diverting loop sigmoid colostomy 2018 in [place].  - oncologist: Dr. [name]; plans to resume exemestane/ abemaciclib when she can take full liquids pSBO suspect 2/2 metastatic breast CA w/ known pelvic metastasis  S/P diagnostic laparoscopy, LOA, SBR, insertion gastrostomy tube, partial colectomy new colostomy 11/08/17 Dr. Johney Maine -  Afebrile, tachycardic to 123  - increase non-narcotic pain control by adding IV tylenol and IV robaxin; PRN dilaudid. - VAC and ostomy appliance change today by WOC, RN.  - having some bowel function, clamp G tube and check residuals q 6h - pepcid IV for reflux, - D/C foley today; follow UOP   - mobilize/IS  FEN: NPO, TNA, clamp gastrostomy tube  VTE: SCD's, Lovenox ID: perioperative cefotetan  Foley: removed 5/31 (POD#2)    LOS: 9 days    Sonia Hill A Sonia Hill 11/12/2017

## 2017-11-12 NOTE — Consult Note (Signed)
Referring Provider:  Dr. Irine Seal ([facility]) Primary Care Physician:  [name], MD Primary Gastroenterologist:  Dr. Oletta Lamas  Reason for Consultation: Drop in hemoglobin  HPI: Sonia Hill is a 62 y.o. female who has metastatic breast cancer with intra-abdominal carcinomatosis leading to small bowel obstruction, prompting numerous abdominal surgeries, most recently 3 days ago when her colostomy was revised and there was a partial small bowel resection for obstruction near the ileal jejunal junction, as well as lysis of adhesions.  Since then, there has been a roughly 3 g drop in hemoglobin, and her ostomy output, although occultly heme positive, is not grossly bloody, nor does she show evidence of bleeding elsewhere.    She is not having overt abdominal pain but distention is difficult to assess because of her postoperative status and lymphedema involving the abdominal wall.  A hemolysis panel has come back negative (haptoglobin still pending).  The patient has not been on ulcerogenic medications prior to admission and is not on blood thinners other than prophylactic Lovenox.   Past Medical History:  Diagnosis Date  . Arthritis    neck, upper back, shoulders - no meds - yoga  . Chronic kidney disease    Only has right kidney  . Depression    Hx - no current problem  . GERD (gastroesophageal reflux disease)    diet controlled - no meds  . Hyperlipidemia    diet controlled  . Hypothyroidism   . lt breast ca dx'd 11/2009  . Osteoporosis, unspecified 12/06/2013  . PONV (postoperative nausea and vomiting)   . SVD (spontaneous vaginal delivery)    x 1    Past Surgical History:  Procedure Laterality Date  . BLADDER NECK RECONSTRUCTION     made a new urethea tube  . bladder tact  2008  . BOWEL RESECTION N/A 11/08/2017   Procedure: SMALL BOWEL RESECTION;  Surgeon: Michael Boston, MD;  Location: [facility];  Service: General;  Laterality: N/A;  . BREAST SURGERY     masectomy left breast  . coloscopy    . COLOSTOMY    . DILATION AND CURETTAGE OF UTERUS    . GASTROSTOMY N/A 11/08/2017   Procedure: INSERTION OF GASTROSTOMY TUBE;  Surgeon: Michael Boston, MD;  Location: [facility];  Service: General;  Laterality: N/A;  . LAPAROSCOPY N/A 01/24/2013   Procedure: LAPROSCOPY OPERATIVE;  Surgeon: Margarette Asal, MD;  Location: [facility];  Service: Gynecology;  Laterality: N/A;  . LAPAROSCOPY N/A 11/08/2017   Procedure: LAPAROSCOPY DIAGNOSTIC;  Surgeon: Michael Boston, MD;  Location: [facility];  Service: General;  Laterality: N/A;  . LYSIS OF ADHESION N/A 11/08/2017   Procedure: LYSIS OF ADHESION;  Surgeon: Michael Boston, MD;  Location: [facility];  Service: General;  Laterality: N/A;  . NEPHRECTOMY Left    left kidney dysfunction, recurrent pyelonephritis  . PARTIAL COLECTOMY N/A 11/08/2017   Procedure: PARTIAL COLECTOMY WITH NEW COLOSTOMY;  Surgeon: Michael Boston, MD;  Location: [facility];  Service: General;  Laterality: N/A;  . SALPINGOOPHORECTOMY Bilateral 01/24/2013   Procedure: SALPINGO OOPHORECTOMY;  Surgeon: Margarette Asal, MD;  Location: [facility];  Service: Gynecology;  Laterality: Bilateral;  . WISDOM TOOTH EXTRACTION      Prior to Admission medications   Medication Sig Start Date End Date Taking? Authorizing Provider  abemaciclib (VERZENIO) 150 MG tablet Take 1 tablet (150 mg total) by mouth 2 (two) times daily. Swallow tablets whole. Do not chew, crush, or split tablets before swallowing. 10/18/17  Yes Magrinat,  Virgie Dad, MD  ALPRAZolam Duanne Moron) 0.25 MG tablet TAKE 1 TABLET BY MOUTH EVERY 8 HOURS AS NEEDED FOR ANXIETY OR SLEEP 08/28/17  Yes Magrinat, Virgie Dad, MD  cholecalciferol (D-VI-SOL) 400 UNIT/ML LIQD Take 400 Units by mouth daily.   Yes [provider]  exemestane (AROMASIN) 25 MG tablet Take 1 tablet (25 mg total) by mouth daily after breakfast. 07/05/17  Yes Magrinat, Virgie Dad, MD  gabapentin (NEURONTIN) 300 MG capsule Take 1 capsule (300 mg total) by  mouth 3 (three) times daily as needed. Patient taking differently: Take 300 mg by mouth 3 (three) times daily as needed (nerve pain).  06/28/17  Yes Magrinat, Virgie Dad, MD  ondansetron (ZOFRAN-ODT) 8 MG disintegrating tablet Take 1 tablet (8 mg total) by mouth every 8 (eight) hours as needed for nausea or vomiting. 06/22/17  Yes Magrinat, Virgie Dad, MD  polyethylene glycol (MIRALAX / GLYCOLAX) packet Take 17 g by mouth daily.   Yes [provider]  simethicone (MYLICON) 170 MG chewable tablet Chew 125 mg by mouth every 2 (two) hours as needed for flatulence.    Yes [provider]  Thyroid (NATURE-THROID) 81.25 MG TABS Take 81.25 mg by mouth daily.   Yes [provider]  promethazine (PHENERGAN) 25 MG suppository Place 1 suppository (25 mg total) rectally every 6 (six) hours as needed for nausea or vomiting. Patient not taking: Reported on 09/13/2017 06/22/17   Magrinat, Virgie Dad, MD    Current Facility-Administered Medications  Medication Dose Route Frequency Provider Last Rate Last Dose  . acetaminophen (OFIRMEV) IV 1,000 mg  1,000 mg Intravenous Q6H PRN Eugenie Filler, MD      . albumin human 25 % solution 50 g  50 g Intravenous BID Roney Jaffe, MD   50 g at 11/12/17 0174  . albuterol (PROVENTIL) (2.5 MG/3ML) 0.083% nebulizer solution 2.5 mg  2.5 mg Nebulization Q6H PRN Michael Boston, MD      . alum & mag hydroxide-simeth (MAALOX/MYLANTA) 200-200-20 MG/5ML suspension 30 mL  30 mL Oral Q6H PRN Michael Boston, MD      . diphenhydrAMINE (BENADRYL) injection 12.5-25 mg  12.5-25 mg Intravenous Q6H PRN Michael Boston, MD      . enoxaparin (LOVENOX) injection 40 mg  40 mg Subcutaneous Ardeen Fillers, MD   40 mg at 11/09/17 2131  . famotidine (PEPCID) IVPB 20 mg premix  20 mg Intravenous Q12H Eugenie Filler, MD      . TPN (CLINIMIX-E) Adult   Intravenous Continuous TPN Angela Adam, [facility]       And  . Fat emulsion 20 % infusion 240 mL  240 mL Intravenous  Continuous TPN Angela Adam, RPH      . ferumoxytol (FERAHEME) 510 mg in sodium chloride 0.9 % 100 mL IVPB  510 mg Intravenous Once Eugenie Filler, MD      . furosemide (LASIX) injection 40 mg  40 mg Intravenous Once Eugenie Filler, MD      . guaiFENesin-dextromethorphan (ROBITUSSIN DM) 100-10 MG/5ML syrup 10 mL  10 mL Oral Q4H PRN Michael Boston, MD      . hydrALAZINE (APRESOLINE) injection 10 mg  10 mg Intravenous Q4H PRN Michael Boston, MD      . hydrocortisone (ANUSOL-HC) 2.5 % rectal cream 1 application  1 application Topical QID PRN Michael Boston, MD      . hydrocortisone cream 1 % 1 application  1 application Topical TID PRN Michael Boston, MD      .  HYDROmorphone (DILAUDID) injection 0.5-2 mg  0.5-2 mg Intravenous Q1H PRN Michael Boston, MD   1 mg at 11/11/17 0153  . insulin aspart (novoLOG) injection 0-9 Units  0-9 Units Subcutaneous Q6H Eudelia Bunch, RPH   1 Units at 11/11/17 1242  . levothyroxine (SYNTHROID, LEVOTHROID) injection 50 mcg  50 mcg Intravenous Daily Michael Boston, MD   50 mcg at 11/12/17 3220  . lip balm (CARMEX) ointment 1 application  1 application Topical BID Michael Boston, MD   1 application at 25/42/70 [phone]  . LORazepam (ATIVAN) injection 0.5 mg  0.5 mg Intravenous Q24H Eugenie Filler, MD   0.5 mg at 11/10/17 2235  . LORazepam (ATIVAN) injection 0.5-1 mg  0.5-1 mg Intravenous Q8H PRN Michael Boston, MD   1 mg at 11/11/17 1242  . magic mouthwash  15 mL Oral QID PRN Michael Boston, MD      . menthol-cetylpyridinium (CEPACOL) lozenge 3 mg  1 lozenge Oral PRN Michael Boston, MD      . methocarbamol (ROBAXIN) 500 mg in dextrose 5 % 50 mL IVPB  500 mg Intravenous Q8H Jill Alexanders, PA-C   Stopped at 11/12/17 0606  . metoCLOPramide (REGLAN) injection 5 mg  5 mg Intravenous TID AC & HS Magrinat, Virgie Dad, MD   5 mg at 11/12/17 1222  . metoprolol tartrate (LOPRESSOR) injection 5 mg  5 mg Intravenous Q6H PRN Michael Boston, MD      . ondansetron [employer] [facility] [facility] [employer] [facility]) tablet  4 mg  4 mg Oral Q6H PRN Michael Boston, MD       Or  . ondansetron [facility]) injection 4 mg  4 mg Intravenous Q6H PRN Michael Boston, MD   4 mg at 11/11/17 0210  . phenol (CHLORASEPTIC) mouth spray 1-2 spray  1-2 spray Mouth/Throat PRN Michael Boston, MD      . prochlorperazine (COMPAZINE) injection 5-10 mg  5-10 mg Intravenous Q4H PRN Michael Boston, MD      . sodium chloride 0.9 % bolus 500 mL  500 mL Intravenous Once Schorr, Rhetta Mura, NP      . sodium chloride flush (NS) 0.9 % injection 10-40 mL  10-40 mL Intracatheter PRN Michael Boston, MD   10 mL at 11/11/17 0509  . sodium chloride tablet 2 g  2 g Oral BID WC Roney Jaffe, MD      . TPN (CLINIMIX-E) Adult   Intravenous Continuous TPN Angela Adam, RPH 75 mL/hr at 11/12/17 1520      Allergies as of 11/03/2017 - Review Complete 11/03/2017  Allergen Reaction Noted  . Sulfa antibiotics Rash 01/21/2013  . Adhesive [tape] Rash 01/22/2013  . Betadine [povidone iodine] Rash 01/22/2013  . Mercury Rash 01/21/2013    Family History  Problem Relation Age of Onset  . Breast cancer Mother 46  . Breast cancer Maternal Aunt 58  . Breast cancer Other        MGF's sister dx with breast cancer in her 18s    Social History   Socioeconomic History  . Marital status: Married    Spouse name: Not on file  . Number of children: Not on file  . Years of education: Not on file  . Highest education level: Not on file  Occupational History  . Not on file  Social Needs  . Financial resource strain: Not on file  . Food insecurity:    Worry: Not on file    Inability: Not on file  . Transportation needs:    Medical: Not  on file    Non-medical: Not on file  Tobacco Use  . Smoking status: Never Smoker  . Smokeless tobacco: Never Used  Substance and Sexual Activity  . Alcohol use: No  . Drug use: No  . Sexual activity: Yes    Birth control/protection: Post-menopausal  Lifestyle  . Physical activity:    Days per week: Not on file     Minutes per session: Not on file  . Stress: Not on file  Relationships  . Social connections:    Talks on phone: Not on file    Gets together: Not on file    Attends religious service: Not on file    Active member of club or organization: Not on file    Attends meetings of clubs or organizations: Not on file    Relationship status: Not on file  . Intimate partner violence:    Fear of current or ex partner: Not on file    Emotionally abused: Not on file    Physically abused: Not on file    Forced sexual activity: Not on file  Other Topics Concern  . Not on file  Social History Narrative  . Not on file    Review of Systems: see HPI  Physical Exam: Vital signs in last 24 hours: Temp:  [97.8 F (36.6 C)-99.2 F (37.3 C)] 98.6 F (37 C) (06/02 1515) Pulse Rate:  [110-133] 110 (06/02 1515) Resp:  [14-20] 16 (06/02 1515) BP: (110-138)/(59-79) 137/63 (06/02 1515) SpO2:  [93 %-100 %] 100 % (06/02 1515) Weight:  [59.2 kg (130 lb 8.2 oz)] 59.2 kg (130 lb 8.2 oz) (06/02 0500) Last BM Date: 11/11/17  Thin Caucasian female lying in bed in no acute distress.  Anicteric, no frank pallor.  Does not look shocky.  Chest clear, heart tachycardic but without pathologic murmur.  Abdomen has a wound VAC in the center from her recent surgery, as well as a left abdominal colostomy that is putting out brown stool, no serosanguineous fluid, no evident melena, no maroon stool.  Lower extremities have edema.  There is no evident ecchymoses on the body except at the right elbow corresponding to a recent IV.  Intake/Output from previous day: 06/01 0701 - 06/02 0700 In: 3575.9 [P.O.:30; I.V.:2710.9; IV Piggyback:835] Out: 250 [Urine:200; Drains:50] Intake/Output this shift: Total I/O In: 900 [I.V.:700; IV Piggyback:200] Out: 800 [Urine:600; Stool:200]  Lab Results: Recent Labs    11/10/17 0442 11/12/17 0418 11/12/17 1114  WBC 7.7 4.1  --   HGB 9.3* 7.1* 6.6*  HCT 27.4* 19.2* 18.1*  PLT 199  179  --    BMET Recent Labs    11/10/17 0442 11/11/17 0508 11/12/17 0418  NA 127* 123* 125*  K 3.7 4.1 3.9  CL 96* 93* 92*  CO2 26 24 24   GLUCOSE 108* 106* 88  BUN 28* 31* 29*  CREATININE 0.89 0.90 0.70  CALCIUM 8.4* 8.9 9.2   LFT Recent Labs    11/12/17 1353  PROT 5.2*  ALBUMIN 3.7  AST 36  ALT 11*  ALKPHOS 79  BILITOT 0.8  BILIDIR 0.3  IBILI 0.5   PT/INR No results for input(s): LABPROT, INR in the last 72 hours.  Studies/Results: Dg Chest Port 1 View  Result Date: 11/11/2017 CLINICAL DATA:  Acute kidney injury.  Chest pain. EXAM: PORTABLE CHEST 1 VIEW COMPARISON:  10/31/2016 FINDINGS: Right arm PICC tip in the SVC 4 cm above the right atrium. Bilateral pleural effusions with dependent atelectasis. Upper lungs are clear.  Previous mastectomy on the left. No acute bone finding. IMPRESSION: Bilateral effusions with dependent pulmonary atelectasis. Electronically Signed   By: Nelson Chimes M.D.   On: 11/11/2017 15:17    Impression: There seems to be a disconnect between the drop in the patient's hemoglobin and any observed loss of blood.  This makes me wonder about the possibility of intra-abdominal bleeding following her recent surgery from the lysis of adhesions, with the known tumor implants.  Plan: 1.  Agree with IV anti-peptic therapy (pantoprazole would be ideal, famotidine is adequate) 2.  Non-contrast CT scan to look for intra-abdominal hemorrhage (patient with one kidney, avoid IV contrast if not essential) 3.  Consider stopping prophylactic Lovenox and using leg squeezers instead   LOS: 9 days   Zohaib Heeney V  11/12/2017, 4:31 PM   Pager [phone] If no answer or after 5 PM call [phone]

## 2017-11-12 NOTE — Progress Notes (Signed)
PROGRESS NOTE    Sonia Hill  WUJ:[id number] DOB: 22-[name]-1956 DOA: 11/03/2017 PCP: [name], MD    Brief Narrative:  41 female-metastatic breast cancer (magrinat)-on Verzenio Colostomy + rectal mass 04/2017-complicated by SBO-recurrent SBO 06/2017 and currently on liquid diet at home  Admit severe excruciating abdominal pain no ostomy output CT = worsening cancer-blood pressures stable Main lab abnormalities potassium 2.5 calcium 5.6 glucose 55 treated initially No NG tube-General surgery and oncology have consulted on the patient It appears that patient may be going for exploratory surgery 5/28 and is n.p.o. 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  Patient subsequently underwent laparoscopic diagnostic small bowel resection, insertion of gastrostomy tube, lysis of adhesion, partial colectomy with new colostomy per Dr. Johney Maine 11/08/2017.  NG tube is been discontinued per general surgery.  IV Robaxin has been added to patient's regimen.  Patient noted to have some bowel  function.  Per general surgery.  Follow.   #2 hyponatremia Initially felt likely secondary to hypervolemic hyponatremia plus or minus SIADH.  Patient noted to have lower extremity edema as well as low albumin levels.  Patient given IV Lasix however worsening sodium levels down to 123  from 127 from 129.  TSH within normal limits.  Cortisol level within normal limits.  Discontinued IV Lasix.  Nephrology consulted to assessed the patient and feel hyponatremia may be secondary to hypervolemia with anasarca.  Patient started on IV albumin and low-dose salt tablets as well as compression stockings.  Nephrology following and appreciate input and recommendations.   3.  Hypokalemia/hypocalcemia/hypomagnesemia Continue TPN.  Electrolytes being repleted per pharmacy.    4.  Hypothyroidism  TSH 3.123.  Continue IV Synthroid.  Once patient has been started on a diet and tolerating will transition to oral Synthroid.    5.  Metastatic estrogen positive lobular breast cancer Patient with metastatic spread to the spine and rectal area resulting in blockage and diverging colostomy.  Oncology following.   6.  Solitary kidney hydronephrosis Status post Foley catheter placement.  Renal function stable.  Nephrology following.  Outpatient follow-up.  7.  Anxiety Ativan as needed.  8 severe protein calorie malnutrition On TPN.  9.  Volume overload Patient with lower extremity edema.  Likely component of hypoalbuminemia.  Current weight is 130 pounds from 123 pounds from 115 pounds from 114 pounds from 108 pounds on 11/04/2017.  Blood pressure somewhat borderline.  Lasix was discontinued due to worsening sodium levels.  Patient was given dose of IV albumin per nephrology on 11/11/2017.  Strict I's and O's.  Daily weights.  Follow.   10.  New  left bundle branch block Noted on EKG the night of 11/08/2017.  Patient denies any chest pain no shortness of breath.  2D echo with a EF of 65 to 70%, no wall motion  abnormalities, left ventricular diastolic function normal.  Cardiology has assessed the patient and feel no further ischemic work-up needed at this time.  11.  Hiccups Likely secondary to malignant bowel obstruction.  Patient on Reglan which has been placed 3 times daily at a lower dose per oncology.  Patient given a dose of Thorazine however was having's some side effects from it and as such Thorazine has been discontinued.  Patient given Cogentin 1 mg IV every 12 hours x2 doses.  Clinical improvement with hiccups.  12 anemia Hemoglobin this morning 7.1 from 9.3 on 11/10/2017.  Patient denies any overt bleeding.  Colostomy with small brown stool.  FOBT was positive.  Repeat H&H was 6.6.  Anemia panel has been ordered and looks like patient does have a severe iron deficiency anemia with iron level less than 5.  Patient has been typed and crossed and will be transfused 2 units packed red blood cells.  IV Feraheme x1.  Check LDH, haptoglobin, hepatic panel.  Check a CT abdomen and pelvis without contrast to rule out wound dehiscence/intra-abdominal bleed.  GI has been consulted who will assess the patient.  General surgery has been informed of drop in patient's hemoglobin.  Hematology/oncology has been informed.  Follow H&H.       DVT prophylaxis: Lovenox. Code Status: Full Family Communication: Updated patient and family at bedside. Disposition Plan: To be determined.    Consultants:   General Surgery: [name] 11/04/2017  Palliative care: Dr. Rowe Pavy 10/30/2017  Cardiology: Dr.Skains 11/10/2017  Nephrology: Dr: Jonnie Finner 11/11/2017  Gastroenterology pending  Procedures:   CT abdomen and pelvis 11/03/2017  Abdominal films 11/07/2017, 11/05/2017, 11/02/2017  Laparoscopic diagnostic small bowel resection, insertion of gastrostomy tube, lysis of adhesion, partial colectomy with new colostomy per Dr. Johney Maine 11/08/2017  2D echo 11/10/2017  Antimicrobials:   None   Subjective: Patient in  bed.  Denies any chest pain.  Agitation/anxiety improved from yesterday.  Patient denies any overt bleeding.  Minimal stool noted in colostomy bag.  Hiccups improved.   Objective: Vitals:   11/11/17 2357 11/12/17 0318 11/12/17 0500 11/12/17 0541  BP: 110/79 122/76  (!) 119/59  Pulse: (!) 125 (!) 133  (!) 126  Resp: 14 20  16   Temp: 98.4 F (36.9 C) 99.1 F (37.3 C)  99.2 F (37.3 C)  TempSrc: Oral Oral  Oral  SpO2: 95% 93%  93%  Weight:   59.2 kg (130 lb 8.2 oz)   Height:        Intake/Output Summary (Last 24 hours) at 11/12/2017 1221 Last data filed at 11/12/2017 0906 Gross per 24 hour  Intake 3898.42 ml  Output 650 ml  Net 3248.42 ml   Filed Weights   11/10/17 0500 11/11/17 0500 11/12/17 0500  Weight: 52.2 kg (115 lb) 55.8 kg (123 lb 1.6 oz) 59.2 kg (130 lb 8.2 oz)    Examination:  General exam: NAD Respiratory system: Lungs CTAB.  Decreased breath sounds in the bases.  No wheezing.  No rhonchi.  Speaking in full sentences.  Normal respiratory effort.   Cardiovascular system: Tachycardia.  2+ bilateral lower extremity edema. Gastrointestinal system: Abdomen is decreased bowel sounds, soft, some diffuse tenderness to palpation, wound VAC intact.  Brown stool noted in colostomy bag.   Central nervous system: Alert and  oriented. No focal neurological deficits. Extremities: 2+ bilateral lower extremity edema.  Symmetric 5 x 5 power. Skin: No rashes, lesions or ulcers Psychiatry: Judgement and insight appear normal. Mood & affect appropriate.     Data Reviewed: I have personally reviewed following labs and imaging studies  CBC: Recent Labs  Lab 11/06/17 0421 11/07/17 0417 11/08/17 0424 11/09/17 0112 11/09/17 0440 11/10/17 0442 11/12/17 0418 11/12/17 1114  WBC 4.0 6.2 4.7  --  2.3* 7.7 4.1  --   NEUTROABS 2.9 5.0 3.4  --   --  7.0  --   --   HGB 10.9* 12.0 11.7* 10.2* 10.2* 9.3* 7.1* 6.6*  HCT 31.6* 35.5* 35.1* 30.4* 30.0* 27.4* 19.2* 18.1*  MCV 83.8 85.5 85.2  --   86.2 84.0 80.0  --   PLT 319 328 271  --  206 199 179  --    Basic Metabolic Panel: Recent Labs  Lab 11/07/17 0417 11/08/17 0424 11/09/17 0440 11/10/17 0442 11/11/17 0508 11/12/17 0418  NA 130*  130* 127* 129* 127* 123* 125*  K 4.0  4.0 3.9 3.9 3.7 4.1 3.9  CL 95*  94* 93* 100* 96* 93* 92*  CO2 26  26 26 23 26 24 24   GLUCOSE 86  85 95 175* 108* 106* 88  BUN 7  7 11  21* 28* 31* 29*  CREATININE 1.05*  0.97 1.02* 0.94 0.89 0.90 0.70  CALCIUM 8.9  8.9 8.8* 8.0* 8.4* 8.9 9.2  MG 1.7 2.1 1.8 2.1 2.0  --   PHOS 2.7 3.4 3.5 3.0 2.5  --    GFR: Estimated Creatinine Clearance: 58.7 mL/min (by C-G formula based on SCr of 0.7 mg/dL). Liver Function Tests: Recent Labs  Lab 11/06/17 0421 11/07/17 0417 11/08/17 0424 11/09/17 0440  AST 26 29 31 25   ALT 10* 9* 13* 8*  ALKPHOS 48 57 64 37*  BILITOT 0.4 0.6 0.3 0.1*  PROT 4.4* 5.2* 5.2* 3.8*  ALBUMIN 2.4*  2.5* 2.8*  2.8* 2.9* 2.3*   No results for input(s): LIPASE, AMYLASE in the last 168 hours. No results for input(s): AMMONIA in the last 168 hours. Coagulation Profile: No results for input(s): INR, PROTIME in the last 168 hours. Cardiac Enzymes: Recent Labs  Lab 11/08/17 2351  TROPONINI <0.03   BNP (last 3 results) No results for input(s): PROBNP in the last 8760 hours. HbA1C: No results for input(s): HGBA1C in the last 72 hours. CBG: Recent Labs  Lab 11/11/17 1220 11/11/17 1758 11/11/17 2351 11/12/17 0548 11/12/17 1205  GLUCAP 128* 101* 101* 116* 88   Lipid Profile: No results for input(s): CHOL, HDL, LDLCALC, TRIG, CHOLHDL, LDLDIRECT in the last 72 hours. Thyroid Function Tests: No results for input(s): TSH, T4TOTAL, FREET4, T3FREE, THYROIDAB in the last 72 hours. Anemia Panel: Recent Labs    11/12/17 1114  RETICCTPCT 0.8   Sepsis Labs: No results for input(s): PROCALCITON, LATICACIDVEN in the last 168 hours.  Recent Results (from the past 240 hour(s))  Urine culture     Status: Abnormal    Collection Time: 11/03/17  4:49 PM  Result Value Ref Range Status   Specimen Description   Final    URINE, CLEAN CATCH Performed at [facility], [place] [address] [place], [place] [address]    Special Requests   Final    NONE Performed at [facility], [place] [address] [place], [place] [address]    Culture MULTIPLE SPECIES PRESENT, SUGGEST RECOLLECTION (A)  Final  Report Status 11/04/2017 FINAL  Final  MRSA PCR Screening     Status: None   Collection Time: 11/07/17  2:43 PM  Result Value Ref Range Status   MRSA by PCR NEGATIVE NEGATIVE Final    Comment:        The GeneXpert MRSA Assay (FDA approved for NASAL specimens only), is one component of a comprehensive MRSA colonization surveillance program. It is not intended to diagnose MRSA infection nor to guide or monitor treatment for MRSA infections. Performed at [facility], [place] [address] [place], [place] [address]   Culture, Urine     Status: Abnormal   Collection Time: 11/08/17  9:54 AM  Result Value Ref Range Status   Specimen Description   Final    URINE, CLEAN CATCH Performed at [facility], [place] [address], [place], [place] [address]    Special Requests   Final    NONE Performed at [facility], [place] [address] [place], [place] [address]    Culture (A)  Final    <10,000 COLONIES/mL INSIGNIFICANT GROWTH Performed at [place] [address] [place], [place] [address]    Report Status 11/09/2017 FINAL  Final  Culture, Urine     Status: Abnormal (Preliminary result)   Collection Time: 11/11/17  8:18 AM  Result Value Ref Range Status   Specimen Description   Final    URINE, RANDOM Performed at [place] [address], [place], [place] [address]    Special Requests   Final    NONE Performed at [facility], [place] [address], [place], [place] [address]    Culture  30,000 COLONIES/mL PSEUDOMONAS AERUGINOSA (A)  Final   Report Status PENDING  Incomplete         Radiology Studies: Dg Chest Port 1 View  Result Date: 11/11/2017 CLINICAL DATA:  Acute kidney injury.  Chest pain. EXAM: PORTABLE CHEST 1 VIEW COMPARISON:  10/31/2016 FINDINGS: Right arm PICC tip in the SVC 4 cm above the right atrium. Bilateral pleural effusions with dependent atelectasis. Upper lungs are clear. Previous mastectomy on the left. No acute bone finding. IMPRESSION: Bilateral effusions with dependent pulmonary atelectasis. Electronically Signed   By: Nelson Chimes M.D.   On: 11/11/2017 15:17        Scheduled Meds: . acetaminophen  650 mg Oral Once  . diphenhydrAMINE  25 mg Oral Once  . enoxaparin (LOVENOX) injection  40 mg Subcutaneous QHS  . furosemide  40 mg Intravenous Once  . insulin aspart  0-9 Units Subcutaneous Q6H  . levothyroxine  50 mcg Intravenous Daily  . lip balm  1 application Topical BID  . LORazepam  0.5 mg Intravenous Q24H  . metoCLOPramide (REGLAN) injection  5 mg Intravenous TID AC & HS  . sodium chloride  2 g Oral BID WC   Continuous Infusions: . sodium chloride    . albumin human    . famotidine (PEPCID) IV Stopped (11/11/17 1311)  . Marland KitchenTPN (CLINIMIX-E) Adult     And  . Fat emulsion    . methocarbamol (ROBAXIN)  IV Stopped (11/12/17 0606)  . sodium chloride    . Marland KitchenTPN (CLINIMIX-E) Adult 75 mL/hr at 11/12/17 0906     LOS: 9 days    Time spent: 40 minutes    Irine Seal, MD [facility] Pager [phone]  If 7PM-7AM, please contact night-coverage [email or web address] Password [facility] 11/12/2017, 12:21 PM

## 2017-11-12 NOTE — Progress Notes (Signed)
Sonia Hill   DOB:[birth year]   QQ#:[id number]   IZT#:[id number]  Subjective:  Javaeh had a very rough day yesterday.  She received Haldol for her hiccups and that certainly did take care of the hiccups but particularly since she was also on metoclopramide that made her exceedingly restless, she was unable to stay in bed, would start walking and almost running down the hall with all her attachments, which was nerve-racking for her husband.  Although symptoms are now much improved and she does look better today than she did yesterday, with less nausea.  She took minimal taste of food yesterday, no real intake.  Her colostomy is working better she says.  Pain is well controlled.  Her sister is in the room.  Objective: middle aged White woman examined in bed Vitals:   11/12/17 0318 11/12/17 0541  BP: 122/76 (!) 119/59  Pulse: (!) 133 (!) 126  Resp: 20 16  Temp: 99.1 F (37.3 C) 99.2 F (37.3 C)  SpO2: 93% 93%    Body mass index is 25.49 kg/m.  Intake/Output Summary (Last 24 hours) at 11/12/2017 0936 Last data filed at 11/12/2017 0906 Gross per 24 hour  Intake 3465.92 ml  Output 550 ml  Net 2915.92 ml    Sclerae unicteric, EOMs intact Oropharynx clear  Lungs no rales or rhonchi, auscultated anterolaterally Heart regular rate and rhythm Abd colostomy has some brown material, and some brown liquid in the bag; the wound VAC and PEG tube are intact MSK bilateral grade 2 lower extremity lymphedema Neuro: nonfocal, well oriented, appropriate affect Breasts: Deferred   CBG (last 3)  Recent Labs    11/11/17 1758 11/11/17 2351 11/12/17 0548  GLUCAP 101* 101* 116*     Labs:  Lab Results  Component Value Date   WBC 4.1 11/12/2017   HGB 7.1 (L) 11/12/2017   HCT 19.2 (L) 11/12/2017   MCV 80.0 11/12/2017   PLT 179 11/12/2017   NEUTROABS 7.0 11/10/2017    '@LASTCHEMISTRY' @  Urine Studies No results for input(s): UHGB, CRYS in the last 72 hours.  Invalid input(s): UACOL, UAPR, USPG,  UPH, UTP, UGL, UKET, UBIL, UNIT, UROB, [place], UEPI, UWBC, Junie Panning [place], [place], [place]  Basic Metabolic Panel: Recent Labs  Lab 11/07/17 0417 11/08/17 0424 11/09/17 0440 11/10/17 0442 11/11/17 0508 11/12/17 0418  NA 130*  130* 127* 129* 127* 123* 125*  K 4.0  4.0 3.9 3.9 3.7 4.1 3.9  CL 95*  94* 93* 100* 96* 93* 92*  CO2 '26  26 26 23 26 24 24  ' GLUCOSE 86  85 95 175* 108* 106* 88  BUN '7  7 11 ' 21* 28* 31* 29*  CREATININE 1.05*  0.97 1.02* 0.94 0.89 0.90 0.70  CALCIUM 8.9  8.9 8.8* 8.0* 8.4* 8.9 9.2  MG 1.7 2.1 1.8 2.1 2.0  --   PHOS 2.7 3.4 3.5 3.0 2.5  --    GFR Estimated Creatinine Clearance: 58.7 mL/min (by C-G formula based on SCr of 0.7 mg/dL). Liver Function Tests: Recent Labs  Lab 11/06/17 0421 11/07/17 0417 11/08/17 0424 11/09/17 0440  AST '26 29 31 25  ' ALT 10* 9* 13* 8*  ALKPHOS 48 57 64 37*  BILITOT 0.4 0.6 0.3 0.1*  PROT 4.4* 5.2* 5.2* 3.8*  ALBUMIN 2.4*  2.5* 2.8*  2.8* 2.9* 2.3*   No results for input(s): LIPASE, AMYLASE in the last 168 hours. No results for input(s): AMMONIA in the last 168 hours. Coagulation profile No results for input(s): INR, PROTIME  in the last 168 hours.  CBC: Recent Labs  Lab 11/06/17 0421 11/07/17 0417 11/08/17 0424 11/09/17 0112 11/09/17 0440 11/10/17 0442 11/12/17 0418  WBC 4.0 6.2 4.7  --  2.3* 7.7 4.1  NEUTROABS 2.9 5.0 3.4  --   --  7.0  --   HGB 10.9* 12.0 11.7* 10.2* 10.2* 9.3* 7.1*  HCT 31.6* 35.5* 35.1* 30.4* 30.0* 27.4* 19.2*  MCV 83.8 85.5 85.2  --  86.2 84.0 80.0  PLT 319 328 271  --  206 199 179   Cardiac Enzymes: Recent Labs  Lab 11/08/17 2351  TROPONINI <0.03   BNP: Invalid input(s): POCBNP CBG: Recent Labs  Lab 11/11/17 0641 11/11/17 1220 11/11/17 1758 11/11/17 2351 11/12/17 0548  GLUCAP 92 128* 101* 101* 116*   D-Dimer No results for input(s): DDIMER in the last 72 hours. Hgb A1c No results for input(s): HGBA1C in the last 72 hours. Lipid Profile No results for input(s):  CHOL, HDL, LDLCALC, TRIG, CHOLHDL, LDLDIRECT in the last 72 hours. Thyroid function studies No results for input(s): TSH, T4TOTAL, T3FREE, THYROIDAB in the last 72 hours.  Invalid input(s): FREET3 Anemia work up No results for input(s): VITAMINB12, FOLATE, FERRITIN, TIBC, IRON, RETICCTPCT in the last 72 hours. Microbiology Recent Results (from the past 240 hour(s))  Urine culture     Status: Abnormal   Collection Time: 11/03/17  4:49 PM  Result Value Ref Range Status   Specimen Description   Final    URINE, CLEAN CATCH Performed at [facility], [place] [address], [place], [place] [address]    Special Requests   Final    NONE Performed at [facility], [place] [address], [place], [place] [address]    Culture MULTIPLE SPECIES PRESENT, SUGGEST RECOLLECTION (A)  Final   Report Status 11/04/2017 FINAL  Final  MRSA PCR Screening     Status: None   Collection Time: 11/07/17  2:43 PM  Result Value Ref Range Status   MRSA by PCR NEGATIVE NEGATIVE Final    Comment:        The GeneXpert MRSA Assay (FDA approved for NASAL specimens only), is one component of a comprehensive MRSA colonization surveillance program. It is not intended to diagnose MRSA infection nor to guide or monitor treatment for MRSA infections. Performed at [facility], [place] [address] [place], [place] [address]   Culture, Urine     Status: Abnormal   Collection Time: 11/08/17  9:54 AM  Result Value Ref Range Status   Specimen Description   Final    URINE, CLEAN CATCH Performed at [facility], [place] [address], [place], [place] [address]    Special Requests   Final    NONE Performed at [facility], [place] [address], [place], [place] [address]    Culture (A)  Final    <10,000 COLONIES/mL INSIGNIFICANT GROWTH Performed at [place] [address] [place], [place] [address]    Report Status 11/09/2017 FINAL   Final      Studies:  Ct Abdomen Pelvis Wo Contrast  Result Date: 11/03/2017 CLINICAL DATA:  Abdominal distension with decreased ostomy output EXAM: CT ABDOMEN AND PELVIS WITHOUT CONTRAST TECHNIQUE: Multidetector CT imaging of the abdomen and pelvis was performed following the standard protocol without IV contrast. COMPARISON:  CT 07/10/2017, 05/22/2017, radiograph 11/02/2017 FINDINGS: Lower chest: Lung bases demonstrate small bilateral pleural effusions. No consolidation. Normal heart size. Hepatobiliary: No focal liver abnormality is seen. No  gallstones, gallbladder wall thickening, or biliary dilatation. Pancreas: Unremarkable. No pancreatic ductal dilatation or surrounding inflammatory changes. Spleen: Normal in size without focal abnormality. Adrenals/Urinary Tract: Right adrenal gland is normal. Mild to moderate right hydronephrosis and hydroureter as before. Absent left kidney. Bladder within normal limits. Stomach/Bowel: Mild-to-moderate fluid distention of stomach. Multiple dilated loops of proximal to mid small bowel, measuring up to 4.5 cm. Abduct transition to decompressed distal small bowel in the right mid to lower abdomen, series 2, image number 43, with minimal contrast within decompressed small bowel loops distal to this. No appreciable contrast in the colon. Status post left lower quadrant double-barrel colostomy. Heterogeneous soft tissue thickening at the anus and rectum, corresponding to previously noted mass. Vascular/Lymphatic: Nonaneurysmal aorta. Limited evaluation for adenopathy due to diffuse abdominal fluid. Reproductive: No adnexal mass. Uterus inseparable from soft tissue mass at the rectum and anus. Other: No free air. Increased abdominal and pelvic ascites, now moderate. Diffuse subcutaneous edema. Musculoskeletal: No acute or suspicious abnormality. IMPRESSION: 1. Multiple dilated loops of small bowel with abduct transition to decompressed distal small bowel in the mid to  lower abdomen slightly to the right of midline, consistent with small bowel obstruction. A small amount of contrast does pass into decompressed small bowel distal to the point of obstruction. 2. Interim development of small pleural effusions. Development of moderate abdominopelvic ascites. Diffuse subcutaneous edema consistent with anasarca 3. Status post left lower quadrant colostomy. Soft tissue enlargement of the anus and rectum, corresponding to previously demonstrated infiltrative mass in the region. 4. Similar mild to moderate right hydronephrosis and slightly dilated right ureter. Electronically Signed   By: Donavan Foil M.D.   On: 11/03/2017 21:41   Dg Abd 1 View  Result Date: 11/03/2017 CLINICAL DATA:  Small-bowel obstruction EXAM: ABDOMEN - 1 VIEW COMPARISON:  07/14/2017 FINDINGS: Moderate small bowel dilatation. Colon decompressed. Left lower quadrant ostomy. No renal calculi. Normal skeletal structures. IMPRESSION: Small-bowel obstruction. Electronically Signed   By: Franchot Gallo M.D.   On: 11/03/2017 08:44   US Renal  Result Date: 10/20/2017 CLINICAL DATA:  62 year old female with a history of hydronephrosis. Left-sided nephrectomy EXAM: RENAL / URINARY TRACT ULTRASOUND COMPLETE COMPARISON:  CT 07/10/2017, MR 10/05/2017 FINDINGS: Right Kidney: Length: 11.8 cm. Hydronephrosis. No significant renal cortical thinning. Echogenicity is similar to that of the adjacent liver parenchyma. Left Kidney: Left nephrectomy Bladder: Urinary bladder is decompressed. IMPRESSION: Right-sided hydronephrosis. Left nephrectomy. Electronically Signed   By: Corrie Mckusick D.O.   On: 10/20/2017 09:33   Dg Chest Port 1 View  Result Date: 11/11/2017 CLINICAL DATA:  Acute kidney injury.  Chest pain. EXAM: PORTABLE CHEST 1 VIEW COMPARISON:  10/31/2016 FINDINGS: Right arm PICC tip in the SVC 4 cm above the right atrium. Bilateral pleural effusions with dependent atelectasis. Upper lungs are clear. Previous mastectomy  on the left. No acute bone finding. IMPRESSION: Bilateral effusions with dependent pulmonary atelectasis. Electronically Signed   By: Nelson Chimes M.D.   On: 11/11/2017 15:17   Dg Abd 2 Views  Result Date: 11/07/2017 CLINICAL DATA:  62 year old female with small bowel obstruction. Recent colonic surgery. Subsequent encounter. EXAM: ABDOMEN - 2 VIEW COMPARISON:  11/05/2017 plain film examination.  11/03/2017 CT. FINDINGS: Gas and fluid distended dilated small bowel loops more prominent than on the recent plain film examination with small bowel measuring up to 4.7 cm versus prior 4.5 cm. Small bowel fold thickening. Air-fluid levels. Interval development of gas filled stomach. Minimal residual contrast within colon. Colostomy left  lower quadrant. No free intraperitoneal air noted. IMPRESSION: Progressive partial small bowel obstructive pattern. Interval development of gas-filled stomach. No free intraperitoneal air. These results will be called to the ordering clinician or representative by the Radiologist Assistant, and communication documented in the PACS or zVision Dashboard. Electronically Signed   By: Genia Del M.D.   On: 11/07/2017 09:05   Dg Abd 2 Views  Result Date: 11/05/2017 CLINICAL DATA:  Per order- small bowel obstruction PT HX: GERD, bladder sugery EXAM: ABDOMEN - 2 VIEW COMPARISON:  CT 11/03/2017 and previous FINDINGS: Visualized lung bases clear.  No free air. Multiple dilated small bowel loops with fluid levels on the erect radiograph. Similar number of involved loops and degree of dilatation since previous radiograph 11/02/2017. There has been progression of oral contrast material into the proximal colon which is nondilated. There is a left lower quadrant ostomy device. No abnormal abdominal calcifications.  Regional bones unremarkable. IMPRESSION: 1. Findings consistent with persistent partial mid/distal small bowel obstruction. 2. No free air. Electronically Signed   By: Lucrezia Europe M.D.    On: 11/05/2017 12:23   [place] Ekg Site Rite  Result Date: 11/04/2017 If Site Rite image not attached, placement could not be confirmed due to current cardiac rhythm.   Assessment: 62 y.o.  BRCA 1-2 negative [facility] woman with lobular breast cancer stage IV at presentation July 2011, admitted with recurren SBO in the setting of abdominal carcinomatosis  (1) status post left mastectomy and sentinel lymph node sampling in July 2011 for a lower inner quadrant T1 N1 M1, stage IV invasive lobular breast cancer, grade 1, strongly estrogen and progesterone receptor-positive, HER2 negative with MIB-1 of 9% and no HER2 amplification,   (2) with multiple sclerotic bone lesions at presentation seen only on CT scan (not on bone scan or PET scan), but  with biopsy-proven metastatic disease to bone; and an elevated CA 27.29 at presentation,   (3) Oncotype recurrence score of 4, predicts a good response to antiestrogens.  (4) Systemic treatment has consisted of             a) tamoxifen with evidence of response but poor tolerance             b) letrozole starting August 2012, discontinued October 2014 per patient              (5) single functioning kidney  (6) status post bilateral salpingo-oophorectomy 01/24/2013, with benign pathology  (7) osteoporosis; the patient refuses bisphosphonate therapy; started [facility] February 2014.               (a) bone density was obtained under the care of Dr. Matthew Saras at [facility], 06/25/2012, showing osteoporosis with a T score -2.6. This was repeated 12/20/2013, showing again osteoporosis with T-scores between -2.4 and -2.8.             (b) the patient refuses zolendronate, denosumab, or other pharmacologic intervention             (c) bone density on 12/30/2015 under Dr. Matthew Saras showing osteoporosis with T score -3.3  (8) the patient refuses standard Mammography or tomography; undergoing thermography screening of the right  breast.  (9) PET scan 06/05/2015 shows rectal thickening and a presacral mass; biopsy of this area 10/09/2015 confirms metastatic lobular breast cancer, again estrogen receptor positive, HER-2 nonamplified.             (a) pelvic MRI 03/11/2016 confirms stability of disease             (  b) chest CT and pelvic MRI 09/02/2016 shows no change in the circumferential rectal thickening or evidence of extension beyond the serosa and chest CT findings             (c) colonoscopy 02/23/2017 showed a very narrow rectal lumen with a few apparently uninvolved centimeters distally              (d) CT of the abdomen and pelvis 05/22/2017 (after diverting colostomy) shows interval increase in the size of the perirectal soft tissue masses.  (10) started fulvestrant and palbociclib 125 mg/ day [21/7] May 2017             (a) palbociclib dose decreased to 100 mg daily [21/7] with second cycle, started 11/25/2015             (b) palbociclib discontinued 03/20/2017 with evidence of disease progression             (c) last fulvestrant dose 03/24/2017, discontinued with evidence of progression  (11) status post colostomy placement [facility] November 2018  (12) started exemestane 07/05/2017,             (a) everolimus 5 mg/d started 07/14/2017--held as of 08/20/2017 secondary to rash             (b) exemestane stopped by patient 09/04/2017 to try "the natural way".  (13) exemestane resumed 10/16/2017  (a) abemaciclib/Verzenio added 10/18/2017  (14) s/p laparoscopic small bowel resection for SBO 11/08/2017    Plan:  I have discussed the pathology with Dr. Jannette Spanner and she tells me the areas of obstruction were all infiltrative tumor, not scar tissue.  This is of course not good news as we may expect more of the same in the future.  Once she is taking p.o.'s reliably we will resume her exemestane and abemaciclib.  Hopefully this will control her cancer for some time and allow her a  moderately adequate quality of life.  Her hiccups appear to have resolved.  She seems to have less nausea today.  Hopefully she will be able to progress with her diet.  I have again encouraged her to spend as much time as possible on her recliner and to walk the halls when she feels up to it  I appreciate the great help this patient is receiving both from surgery and the hospitalist service.    Chauncey Cruel, MD 11/12/2017  9:36 AM Medical Oncology and Hematology [facility] [address] [place], [place] [address] Tel. [phone]    Fax. [phone]

## 2017-11-13 LAB — COMPREHENSIVE METABOLIC PANEL
ALT: 21 U/L (ref 14–54)
AST: 52 U/L — ABNORMAL HIGH (ref 15–41)
Albumin: 3.8 g/dL (ref 3.5–5.0)
Alkaline Phosphatase: 146 U/L — ABNORMAL HIGH (ref 38–126)
Anion gap: 10 (ref 5–15)
BUN: 27 mg/dL — ABNORMAL HIGH (ref 6–20)
CALCIUM: 9.4 mg/dL (ref 8.9–10.3)
CHLORIDE: 96 mmol/L — AB (ref 101–111)
CO2: 24 mmol/L (ref 22–32)
CREATININE: 0.64 mg/dL (ref 0.44–1.00)
GFR calc Af Amer: >60 mL/min (ref >60)
GFR calc non Af Amer: >60 mL/min (ref >60)
Glucose, Bld: 99 mg/dL (ref 65–99)
Potassium: 3 mmol/L — ABNORMAL LOW (ref 3.5–5.1)
Sodium: 130 mmol/L — ABNORMAL LOW (ref 135–145)
Total Bilirubin: 1.5 mg/dL — ABNORMAL HIGH (ref 0.3–1.2)
Total Protein: 5.5 g/dL — ABNORMAL LOW (ref 6.5–8.1)

## 2017-11-13 LAB — CBC
HCT: 27.8 % — ABNORMAL LOW (ref 36.0–46.0)
HEMOGLOBIN: 9.9 g/dL — AB (ref 12.0–15.0)
MCH: 29.3 pg (ref 26.0–34.0)
MCHC: 35.6 g/dL (ref 30.0–36.0)
MCV: 82.2 fL (ref 78.0–100.0)
PLATELETS: 160 10*3/uL (ref 150–400)
RBC: 3.38 MIL/uL — ABNORMAL LOW (ref 3.87–5.11)
RDW: 14 % (ref 11.5–15.5)
WBC: 5.2 10*3/uL (ref 4.0–10.5)

## 2017-11-13 LAB — URINE CULTURE: Culture: 30000 — AB

## 2017-11-13 LAB — GLUCOSE, CAPILLARY
Glucose-Capillary: 91 mg/dL (ref 65–99)
Glucose-Capillary: 90 mg/dL (ref 65–99)

## 2017-11-13 LAB — DIFFERENTIAL
BASOS ABS: 0 10*3/uL (ref 0.0–0.1)
Basophils Relative: 0 %
EOS ABS: 0.1 10*3/uL (ref 0.0–0.7)
Eosinophils Relative: 1 %
LYMPHS ABS: 0.4 10*3/uL — AB (ref 0.7–4.0)
Lymphocytes Relative: 7 %
MONO ABS: 0.5 10*3/uL (ref 0.1–1.0)
Monocytes Relative: 10 %
Neutro Abs: 4.2 10*3/uL (ref 1.7–7.7)
Neutrophils Relative %: 82 %

## 2017-11-13 LAB — PREALBUMIN

## 2017-11-13 LAB — MAGNESIUM: Magnesium: 1.8 mg/dL (ref 1.7–2.4)

## 2017-11-13 LAB — TRIGLYCERIDES: TRIGLYCERIDES: 157 mg/dL — AB (ref <150)

## 2017-11-13 LAB — PHOSPHORUS: Phosphorus: 3.1 mg/dL (ref 2.5–4.6)

## 2017-11-13 MED ORDER — FAT EMULSION PLANT BASED 20 % IV EMUL
240.0000 mL | INTRAVENOUS | Status: AC
  Administered 2017-11-13: 240 mL via INTRAVENOUS
  Filled 2017-11-13: qty 250

## 2017-11-13 MED ORDER — FUROSEMIDE 10 MG/ML IJ SOLN
80.0000 mg | Freq: Every day | INTRAMUSCULAR | Status: DC
  Administered 2017-11-14 – 2017-11-15 (×2): 80 mg via INTRAVENOUS
  Filled 2017-11-13 (×2): qty 8

## 2017-11-13 MED ORDER — MORPHINE SULFATE (PF) 2 MG/ML IV SOLN
1.0000 mg | INTRAVENOUS | Status: DC | PRN
  Administered 2017-11-13 – 2017-11-14 (×3): 2 mg via INTRAVENOUS
  Filled 2017-11-13 (×3): qty 1

## 2017-11-13 MED ORDER — POTASSIUM CHLORIDE 10 MEQ/50ML IV SOLN
10.0000 meq | INTRAVENOUS | Status: AC
  Administered 2017-11-13 (×6): 10 meq via INTRAVENOUS
  Filled 2017-11-13 (×6): qty 50

## 2017-11-13 MED ORDER — M.V.I. ADULT IV INJ
INJECTION | INTRAVENOUS | Status: AC
  Administered 2017-11-13: 17:00:00 via INTRAVENOUS
  Filled 2017-11-13: qty 1800

## 2017-11-13 MED ORDER — MAGNESIUM SULFATE 2 GM/50ML IV SOLN
2.0000 g | Freq: Once | INTRAVENOUS | Status: AC
  Administered 2017-11-13: 2 g via INTRAVENOUS
  Filled 2017-11-13: qty 50

## 2017-11-13 MED ORDER — MORPHINE SULFATE (PF) 2 MG/ML IV SOLN
0.5000 mg | INTRAVENOUS | Status: DC | PRN
  Administered 2017-11-13: 0.5 mg via INTRAVENOUS
  Filled 2017-11-13: qty 1

## 2017-11-13 NOTE — Progress Notes (Signed)
5 Days Post-Op   Subjective/Chief Complaint: Pt up and down overnight d/t lasix Ready for some liquids   Objective: Vital signs in last 24 hours: Temp:  [98.2 F (36.8 C)-99.2 F (37.3 C)] 98.3 F (36.8 C) (06/03 0630) Pulse Rate:  [95-113] 111 (06/03 0630) Resp:  [16-28] 16 (06/03 0630) BP: (107-145)/(63-94) 139/67 (06/03 0630) SpO2:  [97 %-100 %] 99 % (06/03 0630) Weight:  [56.1 kg (123 lb 10.9 oz)] 56.1 kg (123 lb 10.9 oz) (06/03 0500) Last BM Date: 11/12/17  Intake/Output from previous day: 06/02 0701 - 06/03 0700 In: 3407.3 [I.V.:1890.3; Blood:685; IV [id number]:573] Out: 1700 [Urine:1300; Stool:400] Intake/Output this shift: No intake/output data recorded.  General appearance: alert and cooperative GI: soft, non-tender; bowel sounds normal; no masses,  no organomegaly and ostomy pink/patent  Lab Results:  Recent Labs    11/12/17 0418 11/12/17 1114 11/13/17 0648  WBC 4.1  --  5.2  HGB 7.1* 6.6* 9.9*  HCT 19.2* 18.1* 27.8*  PLT 179  --  160   BMET Recent Labs    11/12/17 0418 11/13/17 0648  NA 125* 130*  K 3.9 3.0*  CL 92* 96*  CO2 24 24  GLUCOSE 88 99  BUN 29* 27*  CREATININE 0.70 0.64  CALCIUM 9.2 9.4  Studies/Results: Dg Chest Port 1 View  Result Date: 11/11/2017 CLINICAL DATA:  Acute kidney injury.  Chest pain. EXAM: PORTABLE CHEST 1 VIEW COMPARISON:  10/31/2016 FINDINGS: Right arm PICC tip in the SVC 4 cm above the right atrium. Bilateral pleural effusions with dependent atelectasis. Upper lungs are clear. Previous mastectomy on the left. No acute bone finding. IMPRESSION: Bilateral effusions with dependent pulmonary atelectasis. Electronically Signed   By: Nelson Chimes M.D.   On: 11/11/2017 15:17    Anti-infectives: Anti-infectives (From admission, onward)   Start     Dose/Rate Route Frequency Ordered Stop   11/09/17 0000  cefoTEtan (CEFOTAN) 2 g in sodium chloride 0.9 % 100 mL IVPB     2 g 200 mL/hr over 30 Minutes Intravenous Every 12 hours  11/08/17 1748 11/09/17 0021   11/08/17 1021  sodium chloride 0.9 % with cefoTEtan (CEFOTAN) ADS Med    Note to Pharmacy:  Marchia Meiers   : cabinet override      11/08/17 1021 11/08/17 2229   11/08/17 0600  cefoTEtan (CEFOTAN) 2 g in sodium chloride 0.9 % 100 mL IVPB  Status:  Discontinued    Note to Pharmacy:  Pharmacy may adjust dose strength for optimal dosing.   Send with patient on call to the OR.  Anesthesia to complete antibiotic administration <39min prior to incision per [facility].   2 g 200 mL/hr over 30 Minutes Intravenous On call to O.R. 11/07/17 1234 11/07/17 1433   11/08/17 0600  cefoTEtan in Dextrose 5% (CEFOTAN) IVPB 2 g     2 g Intravenous On call to O.R. 11/07/17 1505 11/08/17 1207   11/07/17 1500  cefoTEtan in Dextrose 5% (CEFOTAN) IVPB 2 g  Status:  Discontinued     2 g Intravenous On call to O.R. 11/07/17 1433 11/07/17 1505      Assessment/Plan: s/p Procedure(s): LAPAROSCOPY DIAGNOSTIC (N/A) SMALL BOWEL RESECTION (N/A) INSERTION OF GASTROSTOMY TUBE (N/A) LYSIS OF ADHESION (N/A) PARTIAL COLECTOMY WITH NEW COLOSTOMY (N/A)    metastatic lobular carcinoma of the breasts/p left mastectomy and sentinel node biopsy in 2011; metastasis to pelvis last year s/p laparoscopic diverting loop sigmoid colostomy 2018 in [place]. -oncologist: Dr. [name]; plans to resumeexemestane/ abemaciclib  when she can take full liquids pSBO suspect 2/2 metastatic breast CA w/ known pelvic metastasis S/P diagnostic laparoscopy, LOA, SBR, insertion gastrostomy tube, partial colectomy new colostomy 11/08/17 Dr. Johney Maine - Afebrile, tachycardic to 111  - increase non-narcotic pain control by adding IV tylenol and IV robaxin; PRN dilaudid. - VAC and ostomy appliance changed today by WOC, RN.  - having some bowel function, clamp G tube today and if tol x6h OK to start liquids - pepcid IV for reflux, - mobilize/IS  FEN: NPO, TNA, clamp gastrostomy tube  VTE: SCD's, Lovenox ID:  perioperative cefotetan  Foley: removed 5/31 (POD#2)     LOS: 10 days    Rosario Jacks., [facility] 11/13/2017

## 2017-11-13 NOTE — Progress Notes (Signed)
, Subjective: The patient was seen and examined at bedside. She is status post laparoscopy with gastrostomy tube placement, partial colectomy, colostomy, small bowel resection( 43 cm), colonic resection( 15 cm). She has history of metastatic lobular carcinoma. She has not noticed any obvious bloody or black output from the ostomy, currently receiving IV TPN with plans to start on clear liquid diet.   Objective: Vital signs in last 24 hours: Temp:  [98.2 F (36.8 C)-99.2 F (37.3 C)] 98.3 F (36.8 C) (06/03 0630) Pulse Rate:  [95-113] 111 (06/03 0630) Resp:  [16-28] 16 (06/03 0630) BP: (107-145)/(63-94) 139/67 (06/03 0630) SpO2:  [97 %-100 %] 99 % (06/03 0630) Weight:  [56.1 kg (123 lb 10.9 oz)] 56.1 kg (123 lb 10.9 oz) (06/03 0500) Weight change: -3.1 kg (-6 lb 13.4 oz) Last BM Date: 11/12/17  PX:TGGYIR built, frail-appearing GENERAL:not in acute distress,mild pallor ABDOMEN:gastrostomy tube in place, wound VAC in place, colostomy with minimal serous output EXTREMITIES:deformity Lab Results: Results for orders placed or performed during the hospital encounter of 11/03/17 (from the past 48 hour(s))  Glucose, capillary     Status: Abnormal   Collection Time: 11/11/17 12:20 PM  Result Value Ref Range   Glucose-Capillary 128 (H) 65 - 99 mg/dL  Glucose, capillary     Status: Abnormal   Collection Time: 11/11/17  5:58 PM  Result Value Ref Range   Glucose-Capillary 101 (H) 65 - 99 mg/dL  Glucose, capillary     Status: Abnormal   Collection Time: 11/11/17 11:51 PM  Result Value Ref Range   Glucose-Capillary 101 (H) 65 - 99 mg/dL   Comment 1 Notify RN    Comment 2 Document in Chart   CBC     Status: Abnormal   Collection Time: 11/12/17  4:18 AM  Result Value Ref Range   WBC 4.1 4.0 - 10.5 K/uL   RBC 2.40 (L) 3.87 - 5.11 MIL/uL   Hemoglobin 7.1 (L) 12.0 - 15.0 g/dL    Comment: REPEATED TO VERIFY DELTA CHECK NOTED    HCT 19.2 (L) 36.0 - 46.0 %   MCV 80.0 78.0 - 100.0 fL   MCH 29.6 26.0 - 34.0 pg   MCHC 37.0 (H) 30.0 - 36.0 g/dL   RDW 14.0 11.5 - 15.5 %   Platelets 179 150 - 400 K/uL    Comment: Performed at [facility], [place] [address] [place], [place] [address]  Basic metabolic panel     Status: Abnormal   Collection Time: 11/12/17  4:18 AM  Result Value Ref Range   Sodium 125 (L) 135 - 145 mmol/L   Potassium 3.9 3.5 - 5.1 mmol/L   Chloride 92 (L) 101 - 111 mmol/L   CO2 24 22 - 32 mmol/L   Glucose, Bld 88 65 - 99 mg/dL   BUN 29 (H) 6 - 20 mg/dL   Creatinine, Ser 0.70 0.44 - 1.00 mg/dL   Calcium 9.2 8.9 - 10.3 mg/dL   GFR calc non Af Amer >60 >60 mL/min   GFR calc Af Amer >60 >60 mL/min    Comment: (NOTE) The eGFR has been calculated using the CKD EPI equation. This calculation has not been validated in all clinical situations. eGFR's persistently <60 mL/min signify possible Chronic Kidney Disease.    Anion gap 9 5 - 15    Comment: Performed at [facility], [place] [address] [place], [place] [address]  Glucose, capillary     Status: Abnormal   Collection Time: 11/12/17  5:48 AM  Result Value Ref Range   Glucose-Capillary 116 (H) 65 - 99 mg/dL   Comment 1 Notify RN    Comment 2 Document in Chart   Occult blood card to lab, stool     Status: Abnormal   Collection Time: 11/12/17 10:33 AM  Result Value Ref Range   Fecal Occult Bld POSITIVE (A) NEGATIVE    Comment: Performed at [facility], [place], [place] [address], [place], [place] [address]  Type and screen [place]     Status: None (Preliminary result)   Collection Time: 11/12/17 11:13 AM  Result Value Ref Range   ABO/RH(D) O POS    Antibody Screen NEG    Sample Expiration 11/15/2017    Unit Number [id number]    Blood Component Type RED CELLS,LR    Unit division 00    Status of Unit ISSUED,FINAL    Transfusion Status OK TO TRANSFUSE    Crossmatch Result      Compatible Performed at [facility],  [place] [address] [place], [place] [address]    Unit Number [id number]    Blood Component Type RED CELLS,LR    Unit division 00    Status of Unit ISSUED    Transfusion Status OK TO TRANSFUSE    Crossmatch Result Compatible   Vitamin B12     Status: None   Collection Time: 11/12/17 11:14 AM  Result Value Ref Range   Vitamin B-12 428 180 - 914 pg/mL    Comment: (NOTE) This assay is not validated for testing neonatal or myeloproliferative syndrome specimens for Vitamin B12 levels. Performed at [facility], [place] [address] [place], [place] [address]   Folate     Status: None   Collection Time: 11/12/17 11:14 AM  Result Value Ref Range   Folate 7.3 >5.9 ng/mL    Comment: Performed at [facility], [place] [address] [place], [place] [address]  Iron and TIBC     Status: Abnormal   Collection Time: 11/12/17 11:14 AM  Result Value Ref Range   Iron <5 (L) 28 - 170 ug/dL   TIBC 89 (L) 250 - 450 ug/dL   Saturation Ratios 4 (L) 10.4 - 31.8 %   UIBC 85 ug/dL    Comment: Performed at [facility], [place] [address], [place], [place] [address]  Ferritin     Status: None   Collection Time: 11/12/17 11:14 AM  Result Value Ref Range   Ferritin 94 11 - 307 ng/mL    Comment: Performed at [facility], [place] [address], [place], [place] [address]  Reticulocytes     Status: Abnormal   Collection Time: 11/12/17 11:14 AM  Result Value Ref Range   Retic Ct Pct 0.8 0.4 - 3.1 %   RBC. 2.24 (L) 3.87 - 5.11 MIL/uL   Retic Count, Absolute 17.9 (L) 19.0 - 186.0 K/uL    Comment: Performed at [facility], [place] [address] [place], [place] [address]  Hemoglobin and hematocrit, blood     Status: Abnormal   Collection Time: 11/12/17 11:14 AM  Result Value Ref Range   Hemoglobin 6.6 (LL) 12.0 - 15.0 g/dL    Comment: RESULT REPEATED AND VERIFIED CRITICAL RESULT CALLED TO, READ BACK BY AND VERIFIED WITH: GORE,V AT  1140 ON [id number] BY HOOKER,B    HCT 18.1 (L) 36.0 - 46.0 %    Comment: Performed at [facility], [place]  [address] [place], [place] [address]  Prepare RBC     Status: None   Collection Time: 11/12/17 11:48 AM  Result Value Ref Range   Order Confirmation      ORDER PROCESSED BY BLOOD BANK Performed at [facility], [place] [address], [place], [place] [address]   Glucose, capillary     Status: None   Collection Time: 11/12/17 12:05 PM  Result Value Ref Range   Glucose-Capillary 88 65 - 99 mg/dL  Lactate dehydrogenase     Status: None   Collection Time: 11/12/17  1:53 PM  Result Value Ref Range   LDH 186 98 - 192 U/L    Comment: Performed at [facility], [place] [address] [place], [place] [address]  Hepatic function panel     Status: Abnormal   Collection Time: 11/12/17  1:53 PM  Result Value Ref Range   Total Protein 5.2 (L) 6.5 - 8.1 g/dL   Albumin 3.7 3.5 - 5.0 g/dL   AST 36 15 - 41 U/L   ALT 11 (L) 14 - 54 U/L   Alkaline Phosphatase 79 38 - 126 U/L   Total Bilirubin 0.8 0.3 - 1.2 mg/dL   Bilirubin, Direct 0.3 0.1 - 0.5 mg/dL   Indirect Bilirubin 0.5 0.3 - 0.9 mg/dL    Comment: Performed at [facility], [place] [address], [place], [place] [address]  Glucose, capillary     Status: None   Collection Time: 11/12/17  5:47 PM  Result Value Ref Range   Glucose-Capillary 81 65 - 99 mg/dL  Glucose, capillary     Status: Abnormal   Collection Time: 11/12/17 11:44 PM  Result Value Ref Range   Glucose-Capillary 105 (H) 65 - 99 mg/dL  Glucose, capillary     Status: None   Collection Time: 11/13/17  6:34 AM  Result Value Ref Range   Glucose-Capillary 91 65 - 99 mg/dL  Comprehensive metabolic panel     Status: Abnormal   Collection Time: 11/13/17  6:48 AM  Result Value Ref Range   Sodium 130 (L) 135 - 145 mmol/L   Potassium 3.0 (L) 3.5 - 5.1 mmol/L    Comment: DELTA CHECK NOTED   Chloride 96 (L) 101 - 111 mmol/L    CO2 24 22 - 32 mmol/L   Glucose, Bld 99 65 - 99 mg/dL   BUN 27 (H) 6 - 20 mg/dL   Creatinine, Ser 0.64 0.44 - 1.00 mg/dL   Calcium 9.4 8.9 - 10.3 mg/dL   Total Protein 5.5 (L) 6.5 - 8.1 g/dL   Albumin 3.8 3.5 - 5.0 g/dL   AST 52 (H) 15 - 41 U/L   ALT 21 14 - 54 U/L   Alkaline Phosphatase 146 (H) 38 - 126 U/L   Total Bilirubin 1.5 (H) 0.3 - 1.2 mg/dL   GFR calc non Af Amer >60 >60 mL/min   GFR calc Af Amer >60 >60 mL/min    Comment: (NOTE) The eGFR has been calculated using the CKD EPI equation. This calculation has not been validated in all clinical situations. eGFR's persistently <60 mL/min signify possible Chronic Kidney Disease.    Anion gap 10 5 - 15    Comment: Performed at [facility], [place] [address], [place], [place] [address]  Magnesium     Status: None   Collection Time: 11/13/17  6:48 AM  Result Value Ref Range   Magnesium 1.8 1.7 - 2.4 mg/dL    Comment: Performed  at [facility], [place] [address], [place], [place] [address]  Phosphorus     Status: None   Collection Time: 11/13/17  6:48 AM  Result Value Ref Range   Phosphorus 3.1 2.5 - 4.6 mg/dL    Comment: Performed at [facility], [place] [address] [place], [place] [address]  CBC     Status: Abnormal   Collection Time: 11/13/17  6:48 AM  Result Value Ref Range   WBC 5.2 4.0 - 10.5 K/uL   RBC 3.38 (L) 3.87 - 5.11 MIL/uL   Hemoglobin 9.9 (L) 12.0 - 15.0 g/dL    Comment: DELTA CHECK NOTED POST TRANSFUSION SPECIMEN    HCT 27.8 (L) 36.0 - 46.0 %   MCV 82.2 78.0 - 100.0 fL   MCH 29.3 26.0 - 34.0 pg   MCHC 35.6 30.0 - 36.0 g/dL   RDW 14.0 11.5 - 15.5 %   Platelets 160 150 - 400 K/uL    Comment: Performed at [facility], [place] [address], [place], [place] [address]  Differential     Status: Abnormal   Collection Time: 11/13/17  6:48 AM  Result Value Ref Range   Neutrophils Relative % 82 %   Lymphocytes Relative 7 %   Monocytes Relative 10  %   Eosinophils Relative 1 %   Basophils Relative 0 %   Neutro Abs 4.2 1.7 - 7.7 K/uL   Lymphs Abs 0.4 (L) 0.7 - 4.0 K/uL   Monocytes Absolute 0.5 0.1 - 1.0 K/uL   Eosinophils Absolute 0.1 0.0 - 0.7 K/uL   Basophils Absolute 0.0 0.0 - 0.1 K/uL   Smear Review MORPHOLOGY UNREMARKABLE     Comment: Performed at [facility], [place] [address], [place], [place] [address]  Triglycerides     Status: Abnormal   Collection Time: 11/13/17  6:48 AM  Result Value Ref Range   Triglycerides 157 (H) <150 mg/dL    Comment: Performed at [facility], [place] [address], [place], [place] [address]  Prealbumin     Status: Abnormal   Collection Time: 11/13/17  6:48 AM  Result Value Ref Range   Prealbumin <5 (L) 18 - 38 mg/dL    Comment: Performed at [place] [address] [place], [place] [address]  Glucose, capillary     Status: None   Collection Time: 11/13/17 11:28 AM  Result Value Ref Range   Glucose-Capillary 90 65 - 99 mg/dL    Studies/Results: Dg Chest Port 1 View  Result Date: 11/11/2017 CLINICAL DATA:  Acute kidney injury.  Chest pain. EXAM: PORTABLE CHEST 1 VIEW COMPARISON:  10/31/2016 FINDINGS: Right arm PICC tip in the SVC 4 cm above the right atrium. Bilateral pleural effusions with dependent atelectasis. Upper lungs are clear. Previous mastectomy on the left. No acute bone finding. IMPRESSION: Bilateral effusions with dependent pulmonary atelectasis. Electronically Signed   By: Nelson Chimes M.D.   On: 11/11/2017 15:17    Medications: I have reviewed the patient's current medications.  Assessment: Acute drop in hemoglobin globin to 6.6 without obvious melena or hematochezia, post 2 unit PRBC transfusion, hemoglobin 9.9.  Remains hemodynamically stable. Slightly elevated BUN/creatinine ratio 27/0.64-on Pepcid 20 mg IV every 12 hours.   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  Plan: Discussed with patient and her hospitalist- Dr.Thompson at bedside. As a hemoglobin has responded to 2 units PRBCs transfusion and there is  no melena or hematochezia, intra-abdominal bleeding from metastatic breast cancer is the likely etiology. May not need a CAT scan, unless her hemoglobin drops further,at that point would get a CAT scan to rule out intra-abdominal bleed. Also if hemoglobin drops,recommend changing Pepcid to PPI. Conservative management. Please recall GI as needed, we'll sign off   Ronnette Juniper 11/13/2017, 12:14 PM   Pager [phone] If no answer or after 5 PM call [phone]

## 2017-11-13 NOTE — Progress Notes (Addendum)
PROGRESS NOTE    Sonia Hill  EGB:[id number] DOB: [birth year] DOA: 11/03/2017 PCP: [name], MD    Brief Narrative:  85 female-metastatic breast cancer (magrinat)-on Verzenio Colostomy + rectal mass 04/2017-complicated by SBO-recurrent SBO 06/2017 and currently on liquid diet at home  Admit severe excruciating abdominal pain no ostomy output CT = worsening cancer-blood pressures stable Main lab abnormalities potassium 2.5 calcium 5.6 glucose 55 treated initially No NG tube-General surgery and oncology have consulted on the patient It appears that patient may be going for exploratory surgery 5/28 and is n.p.o. 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  Patient subsequently underwent laparoscopic diagnostic small bowel resection, insertion of gastrostomy tube, lysis of adhesion, partial colectomy with new colostomy per Dr. Johney Maine 11/08/2017.  NG tube is been discontinued per general surgery.  IV Robaxin has been added to patient's regimen.  Patient noted to have some bowel  function.  Patient diet has been advanced to a full liquid diet per general surgery.  Per general surgery.  Follow.   #2 hyponatremia Initially felt likely secondary to hypervolemic hyponatremia plus or minus SIADH.  Patient noted to have lower extremity edema as well as low albumin levels.  Patient given IV Lasix however worsening sodium levels down to 123  from 127 from 129.  TSH within normal limits.  Cortisol level within normal limits.  Discontinued IV Lasix.  Nephrology consulted to assessed the patient and feel hyponatremia may be secondary to hypervolemia with anasarca.  Patient started on IV albumin and low-dose salt tablets as well as compression stockings.  Patient started back on IV Lasix and dose increased today.  Sodium levels improving.  Follow.  Once tolerating oral intake likely transition to oral Lasix 40 mg daily.  Nephrology following and appreciate input and recommendations.   3.  Hypokalemia/hypocalcemia/hypomagnesemia Continue TPN.  Electrolytes being repleted per pharmacy.    4.  Hypothyroidism  TSH 3.123.  Continue IV Synthroid.  Once patient has been started on a diet and tolerating will transition to oral Synthroid.    5.  Metastatic estrogen positive lobular breast cancer Patient with metastatic spread to the spine and rectal area resulting in blockage and diverging colostomy.  Oncology following.   6.  Solitary kidney hydronephrosis Status post Foley catheter placement.  Renal function stable.  Nephrology following.  Urology consulted and no further work-up needed during this hospitalization.  Outpatient follow-up.    7.  Anxiety Ativan as needed.  8 severe protein calorie malnutrition On TPN.  9.  Volume overload Patient with lower extremity edema.  Likely component of hypoalbuminemia.  Current weight is  123 pounds from 130 pounds from 123 pounds from 115 pounds from 114 pounds from 108 pounds on 11/04/2017.  Blood pressure somewhat borderline.  Lasix was  discontinued due to worsening sodium levels.  Patient was given dose of IV albumin per nephrology on 11/11/2017.  IV Lasix has been resumed per nephrology and dose increased to 80 mg daily.  Strict I's and O's.  Daily weights.  Follow.   10.  New left bundle branch block Noted on EKG the night of 11/08/2017.  Patient denies any chest pain no shortness of breath.  2D echo with a EF of 65 to 70%, no wall motion abnormalities, left ventricular diastolic function normal.  Cardiology has assessed the patient and feel no further ischemic work-up needed at this time.  11.  Hiccups Likely secondary to malignant bowel obstruction.  Patient on Reglan which has been placed 3 times daily at a lower dose per oncology.  Patient given a dose of Thorazine however was having's some side effects from it and as such Thorazine has been discontinued.  Patient given Cogentin 1 mg IV every 12 hours x2 doses.  Clinical improvement with hiccups.  12 anemia Hemoglobin this morning 9.9 from 7.1 from 9.3 on 11/10/2017.  She is status post 2 units packed red blood cells 11/12/2017.  Patient denies any overt bleeding.  Colostomy with small brown stool.  FOBT was positive.  Anemia panel has been ordered and looks like patient does have a severe iron deficiency anemia with iron level less than 5.  Patient has been typed and crossed and will be transfused 2 units packed red blood cells.  IV Feraheme x1.  LDH, haptoglobin, hepatic panel not consistent with hemolysis.  CT abdomen and pelvis was not done.  Patient will be seen in consultation by gastroenterology who are recommending serial CBCs.  Transfuse as needed.  Follow.         DVT prophylaxis: Lovenox. Code Status: Full Family Communication: Updated patient and family at bedside. Disposition Plan: To be determined.    Consultants:   General Surgery: [name] 11/04/2017  Palliative care: Dr. Rowe Pavy 10/30/2017  Cardiology: Dr.Skains 11/10/2017  Nephrology: Dr: Jonnie Finner  11/11/2017  Gastroenterology: Dr. Cristina Gong 11/12/2017  Urology: Dr. Alinda Money 11/13/2017  Procedures:   CT abdomen and pelvis 11/03/2017  Abdominal films 11/07/2017, 11/05/2017, 11/02/2017  Laparoscopic diagnostic small bowel resection, insertion of gastrostomy tube, lysis of adhesion, partial colectomy with new colostomy per Dr. Johney Maine 11/08/2017  2D echo 11/10/2017  2 units packed red blood cells 11/12/2017  Antimicrobials:   None   Subjective: Patient in bed.  No chest pain.  No shortness of breath.  Patient stated purwick was very noisy and as such did not want it at least kept her husband up all night.  No chest pain.  No shortness of breath.  Denies any melanotic output from colostomy.  Hiccups improved.  Objective: Vitals:   11/13/17 0201 11/13/17 0415 11/13/17 0500 11/13/17 0630  BP: (!) 124/94 107/67  139/67  Pulse: (!) 110 95  (!) 111  Resp: (!) 24 (!) 28  16  Temp: 99.1 F (37.3 C) 98.5 F (36.9 C)  98.3 F (36.8 C)  TempSrc: Oral Oral  Oral  SpO2: 97% 100%  99%  Weight:   56.1 kg (123 lb 10.9 oz)   Height:        Intake/Output Summary (Last 24 hours) at 11/13/2017 1227 Last data filed at 11/13/2017 0523 Gross per 24 hour  Intake 2974.83 ml  Output 900 ml  Net 2074.83 ml   Filed Weights   11/11/17 0500 11/12/17 0500 11/13/17 0500  Weight: 55.8 kg (123 lb 1.6 oz) 59.2 kg (130 lb 8.2 oz) 56.1 kg (123 lb 10.9 oz)    Examination:  General exam: NAD Respiratory system: Some diffuse scattered crackles.  No wheezing.  No rhonchi.  Normal respiratory effort.  Cardiovascular system: Tachycardia.  2+ bilateral lower extremity edema. Gastrointestinal system: Abdomen with wound VAC intact.  Small brown stool liquid noted in colostomy bag.  Soft, some diffuse tenderness to palpation, hypoactive bowel sounds.  Central nervous system: Alert and oriented. No focal neurological deficits. Extremities: 2+ bilateral lower extremity edema.  Symmetric 5 x 5 power. Skin: No rashes, lesions  or ulcers Psychiatry: Judgement and insight appear normal. Mood & affect appropriate.     Data Reviewed: I have personally reviewed following labs and imaging studies  CBC: Recent Labs  Lab 11/07/17 0417 11/08/17 0424  11/09/17 0440 11/10/17 0442 11/12/17 0418 11/12/17 1114 11/13/17 0648  WBC 6.2 4.7  --  2.3* 7.7 4.1  --  5.2  NEUTROABS 5.0 3.4  --   --  7.0  --   --  4.2  HGB 12.0 11.7*   < > 10.2* 9.3* 7.1* 6.6* 9.9*  HCT 35.5* 35.1*   < > 30.0* 27.4* 19.2* 18.1* 27.8*  MCV 85.5 85.2  --  86.2 84.0 80.0  --  82.2  PLT 328 271  --  206 199 179  --  160   < > = values in this interval not displayed.   Basic Metabolic Panel: Recent Labs  Lab 11/08/17 0424 11/09/17 0440 11/10/17 0442 11/11/17 0508 11/12/17 0418 11/13/17 0648  NA 127* 129* 127* 123* 125* 130*  K 3.9 3.9 3.7 4.1 3.9 3.0*  CL 93* 100* 96* 93* 92* 96*  CO2 26 23 26 24 24 24   GLUCOSE 95 175* 108* 106* 88 99  BUN 11 21* 28* 31* 29* 27*  CREATININE 1.02* 0.94 0.89 0.90 0.70 0.64  CALCIUM 8.8* 8.0* 8.4* 8.9 9.2 9.4  MG 2.1 1.8 2.1 2.0  --  1.8  PHOS 3.4 3.5 3.0 2.5  --  3.1   GFR: Estimated Creatinine Clearance: 57.2 mL/min (by C-G formula based on SCr of 0.64 mg/dL). Liver Function Tests: Recent Labs  Lab 11/07/17 0417 11/08/17 0424 11/09/17 0440 11/12/17 1353 11/13/17 0648  AST 29 31 25  36 52*  ALT 9* 13* 8* 11* 21  ALKPHOS 57 64 37* 79 146*  BILITOT 0.6 0.3 0.1* 0.8 1.5*  PROT 5.2* 5.2* 3.8* 5.2* 5.5*  ALBUMIN 2.8*  2.8* 2.9* 2.3* 3.7 3.8   No results for input(s): LIPASE, AMYLASE in the last 168 hours. No results for input(s): AMMONIA in the last 168 hours. Coagulation Profile: No results for input(s): INR, PROTIME in the last 168 hours. Cardiac Enzymes: Recent Labs  Lab 11/08/17 2351  TROPONINI <0.03   BNP (last 3 results) No results for input(s): PROBNP in the last 8760 hours. HbA1C: No results for input(s): HGBA1C in the last 72 hours. CBG: Recent Labs  Lab 11/12/17 1205  11/12/17 1747 11/12/17 2344 11/13/17 0634 11/13/17 1128  GLUCAP 88 81 105* 91 90   Lipid Profile: Recent Labs    11/13/17 0648  TRIG 157*   Thyroid Function Tests: No results for input(s): TSH, T4TOTAL, FREET4, T3FREE, THYROIDAB in the last 72 hours. Anemia Panel: Recent Labs    11/12/17 1114  VITAMINB12 428  FOLATE 7.3  FERRITIN 94  TIBC 89*  IRON <5*  RETICCTPCT 0.8   Sepsis Labs: No results for input(s): PROCALCITON, LATICACIDVEN in the last 168 hours.  Recent Results (from the past 240 hour(s))  Urine culture     Status: Abnormal   Collection Time: 11/03/17  4:49 PM  Result Value Ref Range Status   Specimen Description   Final    URINE, CLEAN CATCH Performed at [facility], [place] [address], [place], [place] [address]    Special Requests   Final    NONE Performed at [facility], [place] [address], [place], [place] [address]    Culture MULTIPLE SPECIES PRESENT, SUGGEST RECOLLECTION (A)  Final   Report Status 11/04/2017 FINAL  Final  MRSA PCR Screening     Status: None   Collection Time: 11/07/17  2:43 PM  Result Value Ref Range Status   MRSA by PCR NEGATIVE NEGATIVE Final    Comment:        The GeneXpert MRSA Assay (FDA approved for NASAL specimens only), is one component of a comprehensive MRSA colonization surveillance program. It is not intended to diagnose MRSA infection nor to guide or monitor treatment for MRSA infections. Performed at [facility], [place] [address] [place], [place] [address]   Culture, Urine     Status: Abnormal   Collection Time: 11/08/17  9:54 AM  Result Value Ref Range Status   Specimen Description   Final    URINE, CLEAN CATCH Performed at [facility], [place] [address], [place], [place] [address]    Special Requests   Final    NONE Performed at [facility], [place] [address] [place], [place] [address]    Culture (A)  Final     <10,000 COLONIES/mL INSIGNIFICANT GROWTH Performed at [place] [address], [place], [place] [address]    Report Status 11/09/2017 FINAL  Final  Culture, Urine     Status: Abnormal   Collection Time: 11/11/17  8:18 AM  Result Value Ref Range Status   Specimen Description   Final    URINE, RANDOM Performed at [place] [address] [place], [place] [address]    Special Requests   Final    NONE Performed at [facility], [place] [address], [place], [place] [address]    Culture 30,000 COLONIES/mL PSEUDOMONAS AERUGINOSA (A)  Final   Report Status 11/13/2017 FINAL  Final   Organism ID, Bacteria PSEUDOMONAS AERUGINOSA (A)  Final      Susceptibility   Pseudomonas aeruginosa - MIC*    CEFTAZIDIME <=1 SENSITIVE Sensitive     CIPROFLOXACIN <=0.25 SENSITIVE Sensitive     GENTAMICIN <=1 SENSITIVE Sensitive     IMIPENEM 1 SENSITIVE Sensitive     PIP/TAZO <=4 SENSITIVE Sensitive     CEFEPIME <=1 SENSITIVE Sensitive     * 30,000 COLONIES/mL PSEUDOMONAS AERUGINOSA         Radiology Studies: Dg Chest Port 1 View  Result Date: 11/11/2017 CLINICAL DATA:  Acute kidney injury.  Chest pain. EXAM: PORTABLE CHEST 1 VIEW COMPARISON:  10/31/2016 FINDINGS: Right arm PICC tip in the SVC 4 cm above the right atrium. Bilateral pleural effusions with dependent atelectasis. Upper lungs are clear. Previous mastectomy on the left. No acute bone finding. IMPRESSION: Bilateral effusions with dependent pulmonary atelectasis. Electronically Signed   By: Nelson Chimes M.D.   On: 11/11/2017 15:17  Scheduled Meds: . furosemide  80 mg Intravenous Daily  . levothyroxine  50 mcg Intravenous Daily  . lip balm  1 application Topical BID  . LORazepam  0.5 mg Intravenous Q24H  . metoCLOPramide (REGLAN) injection  5 mg Intravenous TID AC & HS   Continuous Infusions: . acetaminophen Stopped (11/12/17 2015)  . famotidine (PEPCID) IV Stopped (11/13/17 1041)   . Marland KitchenTPN (CLINIMIX-E) Adult     And  . Fat emulsion    . methocarbamol (ROBAXIN)  IV Stopped (11/13/17 0545)  . potassium chloride 10 mEq (11/13/17 1138)  . Marland KitchenTPN (CLINIMIX-E) Adult 75 mL/hr at 11/13/17 0208     LOS: 10 days    Time spent: 40 minutes    Irine Seal, MD [facility] Pager [phone]  If 7PM-7AM, please contact night-coverage [email or web address] Password [email or web address] 11/13/2017, 12:27 PM

## 2017-11-13 NOTE — Consult Note (Signed)
Urology Consult   Physician requesting consult: Dr. Wonda Olds  Reason for consult: Solitary kidney with hydronephrosis  History of Present Illness: Sonia Hill is a 62 y.o. followed by Dr. Bjorn Loser.  She has a history of a right solitary kidney s/p left nephrectomy at age 33 due to reflux and chronic infection.  She also had right ureteral reflux and underwent a right ureteral reimplantation about 30 years ago.  She currently has metastatic breast cancer and is admitted for a small bowel obstruction s/p bowel diversion.  CT imaging on 11/03/17 indicated mild right hydronephrosis.  Her renal function has remained normal. She denies right flank pain.  She does have abdominal pain although this has been consistent with expected postoperative pain.    Past Medical History:  Diagnosis Date  . Arthritis    neck, upper back, shoulders - no meds - yoga  . Chronic kidney disease    Only has right kidney  . Depression    Hx - no current problem  . GERD (gastroesophageal reflux disease)    diet controlled - no meds  . Hyperlipidemia    diet controlled  . Hypothyroidism   . lt breast ca dx'd 11/2009  . Osteoporosis, unspecified 12/06/2013  . PONV (postoperative nausea and vomiting)   . SVD (spontaneous vaginal delivery)    x 1    Past Surgical History:  Procedure Laterality Date  . BLADDER NECK RECONSTRUCTION     made a new urethea tube  . bladder tact  2008  . BOWEL RESECTION N/A 11/08/2017   Procedure: SMALL BOWEL RESECTION;  Surgeon: Michael Boston, MD;  Location: [facility];  Service: General;  Laterality: N/A;  . BREAST SURGERY     masectomy left breast  . coloscopy    . COLOSTOMY    . DILATION AND CURETTAGE OF UTERUS    . GASTROSTOMY N/A 11/08/2017   Procedure: INSERTION OF GASTROSTOMY TUBE;  Surgeon: Michael Boston, MD;  Location: [facility];  Service: General;  Laterality: N/A;  . LAPAROSCOPY N/A 01/24/2013   Procedure: LAPROSCOPY OPERATIVE;  Surgeon: Margarette Asal, MD;   Location: [facility];  Service: Gynecology;  Laterality: N/A;  . LAPAROSCOPY N/A 11/08/2017   Procedure: LAPAROSCOPY DIAGNOSTIC;  Surgeon: Michael Boston, MD;  Location: [facility];  Service: General;  Laterality: N/A;  . LYSIS OF ADHESION N/A 11/08/2017   Procedure: LYSIS OF ADHESION;  Surgeon: Michael Boston, MD;  Location: [facility];  Service: General;  Laterality: N/A;  . NEPHRECTOMY Left    left kidney dysfunction, recurrent pyelonephritis  . PARTIAL COLECTOMY N/A 11/08/2017   Procedure: PARTIAL COLECTOMY WITH NEW COLOSTOMY;  Surgeon: Michael Boston, MD;  Location: [facility];  Service: General;  Laterality: N/A;  . SALPINGOOPHORECTOMY Bilateral 01/24/2013   Procedure: SALPINGO OOPHORECTOMY;  Surgeon: Margarette Asal, MD;  Location: [facility];  Service: Gynecology;  Laterality: Bilateral;  . [place] EXTRACTION       Current Hospital Medications:  Home meds:  Current Meds  Medication Sig  . abemaciclib (VERZENIO) 150 MG tablet Take 1 tablet (150 mg total) by mouth 2 (two) times daily. Swallow tablets whole. Do not chew, crush, or split tablets before swallowing.  Marland Kitchen ALPRAZolam (XANAX) 0.25 MG tablet TAKE 1 TABLET BY MOUTH EVERY 8 HOURS AS NEEDED FOR ANXIETY OR SLEEP  . cholecalciferol (D-VI-SOL) 400 UNIT/ML LIQD Take 400 Units by mouth daily.  Marland Kitchen exemestane (AROMASIN) 25 MG tablet Take 1 tablet (25 mg total) by mouth daily after breakfast.  .  gabapentin (NEURONTIN) 300 MG capsule Take 1 capsule (300 mg total) by mouth 3 (three) times daily as needed. (Patient taking differently: Take 300 mg by mouth 3 (three) times daily as needed (nerve pain). )  . ondansetron (ZOFRAN-ODT) 8 MG disintegrating tablet Take 1 tablet (8 mg total) by mouth every 8 (eight) hours as needed for nausea or vomiting.  . polyethylene glycol (MIRALAX / GLYCOLAX) packet Take 17 g by mouth daily.  . simethicone (MYLICON) 536 MG chewable tablet Chew 125 mg by mouth every 2 (two) hours as needed for flatulence.   . Thyroid  (NATURE-THROID) 81.25 MG TABS Take 81.25 mg by mouth daily.    Scheduled Meds: . furosemide  80 mg Intravenous Daily  . levothyroxine  50 mcg Intravenous Daily  . lip balm  1 application Topical BID  . LORazepam  0.5 mg Intravenous Q24H  . metoCLOPramide (REGLAN) injection  5 mg Intravenous TID AC & HS   Continuous Infusions: . famotidine (PEPCID) IV Stopped (11/13/17 1041)  . Marland KitchenTPN (CLINIMIX-E) Adult 75 mL/hr at 11/13/17 1725   And  . Fat emulsion 240 mL (11/13/17 1725)  . methocarbamol (ROBAXIN)  IV Stopped (11/13/17 1608)  . potassium chloride 10 mEq (11/13/17 1709)  . Marland KitchenTPN (CLINIMIX-E) Adult 75 mL/hr at 11/13/17 0208   PRN Meds:.albuterol, alum & mag hydroxide-simeth, diphenhydrAMINE, guaiFENesin-dextromethorphan, hydrALAZINE, hydrocortisone, hydrocortisone cream, LORazepam, magic mouthwash, menthol-cetylpyridinium, metoprolol tartrate, morphine injection, ondansetron **OR** ondansetron (ZOFRAN) IV, phenol, prochlorperazine, sodium chloride flush  Allergies:  Allergies  Allergen Reactions  . Sulfa Antibiotics Rash  . Adhesive [Tape] Rash    Ok to use paper tape and tegaderm over IV site  . Betadine [Povidone Iodine] Rash  . Mercury Rash    Reaction mercurachrome    Family History  Problem Relation Age of Onset  . Breast cancer Mother 56  . Breast cancer Maternal Aunt 58  . Breast cancer Other        MGF's sister dx with breast cancer in her 67s    Social History:  reports that she has never smoked. She has never used smokeless tobacco. She reports that she does not drink alcohol or use drugs.  ROS: A complete review of systems was performed.  All systems are negative except for pertinent findings as noted.  Physical Exam:  Vital signs in last 24 hours: Temp:  [98.3 F (36.8 C)-99.2 F (37.3 C)] 98.7 F (37.1 C) (06/03 1531) Pulse Rate:  [95-111] 111 (06/03 1531) Resp:  [16-28] 18 (06/03 1531) BP: (107-145)/(67-94) 111/86 (06/03 1531) SpO2:  [96 %-100 %] 96 %  (06/03 1531) Weight:  [56.1 kg (123 lb 10.9 oz)] 56.1 kg (123 lb 10.9 oz) (06/03 0500) Constitutional:  Alert and oriented, No acute distress Cardiovascular: Regular rate and rhythm, No JVD Respiratory: Normal respiratory effort GI: Abdomen is soft, nondistended, no abdominal masses. She has a left sided colostomy. Incision with dressing in place. GU: No CVA tenderness Lymphatic: No lymphadenopathy Neurologic: Grossly intact, no focal deficits Psychiatric: Normal mood and affect  Laboratory Data:  Recent Labs    11/12/17 0418 11/12/17 1114 11/13/17 0648  WBC 4.1  --  5.2  HGB 7.1* 6.6* 9.9*  HCT 19.2* 18.1* 27.8*  PLT 179  --  160    Recent Labs    11/11/17 0508 11/12/17 0418 11/13/17 0648  NA 123* 125* 130*  K 4.1 3.9 3.0*  CL 93* 92* 96*  GLUCOSE 106* 88 99  BUN 31* 29* 27*  CALCIUM 8.9 9.2 9.4  CREATININE 0.90 0.70 0.64     Results for orders placed or performed during the hospital encounter of 11/03/17 (from the past 24 hour(s))  Glucose, capillary     Status: Abnormal   Collection Time: 11/12/17 11:44 PM  Result Value Ref Range   Glucose-Capillary 105 (H) 65 - 99 mg/dL  Glucose, capillary     Status: None   Collection Time: 11/13/17  6:34 AM  Result Value Ref Range   Glucose-Capillary 91 65 - 99 mg/dL  Comprehensive metabolic panel     Status: Abnormal   Collection Time: 11/13/17  6:48 AM  Result Value Ref Range   Sodium 130 (L) 135 - 145 mmol/L   Potassium 3.0 (L) 3.5 - 5.1 mmol/L   Chloride 96 (L) 101 - 111 mmol/L   CO2 24 22 - 32 mmol/L   Glucose, Bld 99 65 - 99 mg/dL   BUN 27 (H) 6 - 20 mg/dL   Creatinine, Ser 0.64 0.44 - 1.00 mg/dL   Calcium 9.4 8.9 - 10.3 mg/dL   Total Protein 5.5 (L) 6.5 - 8.1 g/dL   Albumin 3.8 3.5 - 5.0 g/dL   AST 52 (H) 15 - 41 U/L   ALT 21 14 - 54 U/L   Alkaline Phosphatase 146 (H) 38 - 126 U/L   Total Bilirubin 1.5 (H) 0.3 - 1.2 mg/dL   GFR calc non Af Amer >60 >60 mL/min   GFR calc Af Amer >60 >60 mL/min   Anion gap  10 5 - 15  Magnesium     Status: None   Collection Time: 11/13/17  6:48 AM  Result Value Ref Range   Magnesium 1.8 1.7 - 2.4 mg/dL  Phosphorus     Status: None   Collection Time: 11/13/17  6:48 AM  Result Value Ref Range   Phosphorus 3.1 2.5 - 4.6 mg/dL  CBC     Status: Abnormal   Collection Time: 11/13/17  6:48 AM  Result Value Ref Range   WBC 5.2 4.0 - 10.5 K/uL   RBC 3.38 (L) 3.87 - 5.11 MIL/uL   Hemoglobin 9.9 (L) 12.0 - 15.0 g/dL   HCT 27.8 (L) 36.0 - 46.0 %   MCV 82.2 78.0 - 100.0 fL   MCH 29.3 26.0 - 34.0 pg   MCHC 35.6 30.0 - 36.0 g/dL   RDW 14.0 11.5 - 15.5 %   Platelets 160 150 - 400 K/uL  Differential     Status: Abnormal   Collection Time: 11/13/17  6:48 AM  Result Value Ref Range   Neutrophils Relative % 82 %   Lymphocytes Relative 7 %   Monocytes Relative 10 %   Eosinophils Relative 1 %   Basophils Relative 0 %   Neutro Abs 4.2 1.7 - 7.7 K/uL   Lymphs Abs 0.4 (L) 0.7 - 4.0 K/uL   Monocytes Absolute 0.5 0.1 - 1.0 K/uL   Eosinophils Absolute 0.1 0.0 - 0.7 K/uL   Basophils Absolute 0.0 0.0 - 0.1 K/uL   Smear Review MORPHOLOGY UNREMARKABLE   Triglycerides     Status: Abnormal   Collection Time: 11/13/17  6:48 AM  Result Value Ref Range   Triglycerides 157 (H) <150 mg/dL  Prealbumin     Status: Abnormal   Collection Time: 11/13/17  6:48 AM  Result Value Ref Range   Prealbumin <5 (L) 18 - 38 mg/dL  Glucose, capillary     Status: None   Collection Time: 11/13/17 11:28 AM  Result Value Ref Range  Glucose-Capillary 90 65 - 99 mg/dL   Recent Results (from the past 240 hour(s))  MRSA PCR Screening     Status: None   Collection Time: 11/07/17  2:43 PM  Result Value Ref Range Status   MRSA by PCR NEGATIVE NEGATIVE Final    Comment:        The GeneXpert MRSA Assay (FDA approved for NASAL specimens only), is one component of a comprehensive MRSA colonization surveillance program. It is not intended to diagnose MRSA infection nor to guide or monitor  treatment for MRSA infections. Performed at [facility], [place] [address] [place], [place] [address]   Culture, Urine     Status: Abnormal   Collection Time: 11/08/17  9:54 AM  Result Value Ref Range Status   Specimen Description   Final    URINE, CLEAN CATCH Performed at [facility], [place] [address], [place], [place] [address]    Special Requests   Final    NONE Performed at [facility], [place] [address], [place], [place] [address]    Culture (A)  Final    <10,000 COLONIES/mL INSIGNIFICANT GROWTH Performed at [facility] [address] [place], [place] [address]    Report Status 11/09/2017 FINAL  Final  Culture, Urine     Status: Abnormal   Collection Time: 11/11/17  8:18 AM  Result Value Ref Range Status   Specimen Description   Final    URINE, RANDOM Performed at [facility] [address] [place],  [address]    Special Requests   Final    NONE Performed at [facility], [place] [address], [place], [place] [address]    Culture 30,000 COLONIES/mL PSEUDOMONAS AERUGINOSA (A)  Final   Report Status 11/13/2017 FINAL  Final   Organism ID, Bacteria PSEUDOMONAS AERUGINOSA (A)  Final      Susceptibility   Pseudomonas aeruginosa - MIC*    CEFTAZIDIME <=1 SENSITIVE Sensitive     CIPROFLOXACIN <=0.25 SENSITIVE Sensitive     GENTAMICIN <=1 SENSITIVE Sensitive     IMIPENEM 1 SENSITIVE Sensitive     PIP/TAZO <=4 SENSITIVE Sensitive     CEFEPIME <=1 SENSITIVE Sensitive     * 30,000 COLONIES/mL PSEUDOMONAS AERUGINOSA    Renal Function: Recent Labs    11/07/17 0417 11/08/17 0424 11/09/17 0440 11/10/17 0442 11/11/17 0508 11/12/17 0418 11/13/17 0648  CREATININE 1.05*  0.97 1.02* 0.94 0.89 0.90 0.70 0.64   Estimated Creatinine Clearance: 57.2 mL/min (by C-G formula based on SCr of 0.64 mg/dL).  Radiologic Imaging: No results found.  I independently reviewed the  above imaging studies.  Impression/Recommendation: Right hydronephrosis with solitary kidney:  I discuss this finding with Ms. Butler today.  In the setting of normal renal function, there is no indication for urologic intervention.  I would recommend continuing to periodically monitor her renal function.  If she develops worsening renal function in the future, she should follow up with Dr. Matilde Sprang to consider ureteral stenting at that time. She expressed her understanding and all questions were answered to her stated satisfaction.  She will not need routine urologic follow up after her hospitalization.  Dora Simeone,LES 11/13/2017, 5:55 PM  Pryor Curia. MD   CC: Dr. Irine Seal

## 2017-11-13 NOTE — Progress Notes (Signed)
GTube clampped per MD order

## 2017-11-13 NOTE — Consult Note (Addendum)
[facility] Nurse ostomy follow up Stoma type/location: Colostomy pouch change; pt is familiar with pouching routines since she had one prior to admission and denies further questions. She was using a 2 piece Hollister system and barrier ring and can continue to use the same pouches, opening will need to be cut slightly larger and more oval than she was previously doing. Cut off center to accommodate NPWT dressing.   Stomal assessment/size: 1 1/2 inches, oval, flush with skin level Peristomal assessment: intact skin surrounding Treatment options for stomal/peristomal skin: barrier ring Output: scant amt liquid  brown stool Ostomy pouching: 2pc.2 1/4" pouch with barrier ring  Education provided: none needed.   Enrolled patient in [facility] Start Discharge program: Pt was enrolled in a program for supplies prior to admission. Supplies left at the bedside for staff nurse use.  [facility] Nurse wound consult note Surgical team following for assessment and plan of care to abd wound. Reason for Consult:midline abdominal wound with NPWT VAC dressing change Wound type: full thickness midline surgical wound; decreased in size and depth since previous assessment.  Do not feel that patient will need the Vac upon discharge. Wound XBM:WUXLK red Drainage (amount, consistency, odor) minimal amt pink drainage in the cannister Periwound: ostomy, PEG tube are in close proximity to the Vac dressing. Pt medicated for pain prior to the procedure and tolerated with minimal mat discomfort.  Applied one piece black foam to 161mm cont suction .WOC will plan to change Q M/W/F  Julien Girt MSN, RN, [place], [place], [place]

## 2017-11-13 NOTE — Progress Notes (Signed)
Subjective: Interval History: has no complaint , resting, hopeing to drink today.  Objective: Vital signs in last 24 hours: Temp:  [98.2 F (36.8 C)-99.2 F (37.3 C)] 98.3 F (36.8 C) (06/03 0630) Pulse Rate:  [95-113] 111 (06/03 0630) Resp:  [16-28] 16 (06/03 0630) BP: (107-145)/(63-94) 139/67 (06/03 0630) SpO2:  [97 %-100 %] 99 % (06/03 0630) Weight:  [56.1 kg (123 lb 10.9 oz)] 56.1 kg (123 lb 10.9 oz) (06/03 0500) Weight change: -3.1 kg (-6 lb 13.4 oz)  Intake/Output from previous day: 06/02 0701 - 06/03 0700 In: 3407.3 [I.V.:1890.3; Blood:685; IV [id number]:559] Out: 1700 [Urine:1300; Stool:400] Intake/Output this shift: No intake/output data recorded.  General appearance: alert, cooperative, no distress and pale Resp: rales bibasilar Cardio: S1, S2 normal and systolic murmur: systolic ejection 2/6, decrescendo at 2nd left intercostal space GI: PEG, ostomy , vac Extremities: edema 3-4+  Lab Results: Recent Labs    11/12/17 0418 11/12/17 1114 11/13/17 0648  WBC 4.1  --  5.2  HGB 7.1* 6.6* 9.9*  HCT 19.2* 18.1* 27.8*  PLT 179  --  160   BMET:  Recent Labs    11/12/17 0418 11/13/17 0648  NA 125* 130*  K 3.9 3.0*  CL 92* 96*  CO2 24 24  GLUCOSE 88 99  BUN 29* 27*  CREATININE 0.70 0.64  CALCIUM 9.2 9.4   No results for input(s): PTH in the last 72 hours. Iron Studies:  Recent Labs    11/12/17 1114  IRON <5*  TIBC 89*  FERRITIN 94    Studies/Results: Dg Chest Port 1 View  Result Date: 11/11/2017 CLINICAL DATA:  Acute kidney injury.  Chest pain. EXAM: PORTABLE CHEST 1 VIEW COMPARISON:  10/31/2016 FINDINGS: Right arm PICC tip in the SVC 4 cm above the right atrium. Bilateral pleural effusions with dependent atelectasis. Upper lungs are clear. Previous mastectomy on the left. No acute bone finding. IMPRESSION: Bilateral effusions with dependent pulmonary atelectasis. Electronically Signed   By: Nelson Chimes M.D.   On: 11/11/2017 15:17    I have reviewed  the patient's current medications.  Assessment/Plan: 1 Hypervolemic hyponatremia, needs diuresis . Cannot r/o SIADH in this setting but diuresis primary issue. SNa better.  No role for NaCl tabs at this time.  When can take pos use po Lasix.  SNa better.Iatrogenic vol xs 2 Metastatic breast Ca 3 Bowel obstruction. P will s/o at this time. Use lasix q d    LOS: 10 days   Jeneen Rinks Shanyce Daris 11/13/2017,11:27 AM

## 2017-11-13 NOTE — Progress Notes (Signed)
[facility] NOTE   Pharmacy Consult for TPN Indication: Intolerance to enteral feeding  Patient Measurements: Height: 5' (152.4 cm) Weight: 123 lb 10.9 oz (56.1 kg) IBW/kg (Calculated) : 45.5 TPN AdjBW (KG): 49 Body mass index is 24.15 kg/m.   Insulin Requirements: none, DC SSI/CBGs 6/3  Current Nutrition: NPO, ice chips  IVF: none ordered  Central access: PICC 5/25 TPN start date: 5/28  ASSESSMENT                                                                                                          HPI:  Patient has metastatic breast cancer and partial small bowel obstruction on full liquids for 4 months, now intolerance to clear liquids.  Significant events:  5/29 surgery 5/31 advanced to CLD but did not tolerate  Today:   Glucose - at goal of <150  Electrolytes -Na up to 130 (see nephrologist note- on IV albumin, salt tabs, IV lasix)  K 3, mag 1.8, phos 3.1  Renal - WNL  LFTs - AST and Alk phos a tiny bit elevated 6/3  TGs - 152, 157 ( 6/3)  Prealbumin - 10.7  GI: 400 ml stool/24 hrs, on IV Pepcid  NUTRITIONAL GOALS                                                                                             RD recs: 5/30 1700-1800 Kcal and 85-95 gm protein Clinimix E 5/15 at a goal rate of 75 ml/hr + 20% fat emulsion at 29m/hr to provide: 90 g/day protein, 1758 Kcal/day.  PLAN      Mag 2 gm followed by KCl 167m IV x 6                                                                                                    At 1800 today:  continue Clinimix E 5/15 at 75 ml/hr.to meet 100% of goals  20% fat emulsion at 2051mr.  TPN to contain standard multivitamins and trace elements.  DC CBGs and SSI 6/3  TPN lab panels on Mondays & Thursdays.  F/u daily.  MicEudelia Bunchharm.D. [id number]/2019 8:25 AM

## 2017-11-14 LAB — CBC WITH DIFFERENTIAL/PLATELET
BASOS ABS: 0 10*3/uL (ref 0.0–0.1)
BASOS PCT: 0 %
EOS ABS: 0 10*3/uL (ref 0.0–0.7)
Eosinophils Relative: 1 %
HEMATOCRIT: 32.3 % — AB (ref 36.0–46.0)
HEMOGLOBIN: 11.4 g/dL — AB (ref 12.0–15.0)
Lymphocytes Relative: 6 %
Lymphs Abs: 0.5 10*3/uL — ABNORMAL LOW (ref 0.7–4.0)
MCH: 28.9 pg (ref 26.0–34.0)
MCHC: 35.3 g/dL (ref 30.0–36.0)
MCV: 82 fL (ref 78.0–100.0)
Monocytes Absolute: 0.8 10*3/uL (ref 0.1–1.0)
Monocytes Relative: 9 %
NEUTROS ABS: 7.4 10*3/uL (ref 1.7–7.7)
NEUTROS PCT: 84 %
Platelets: 164 10*3/uL (ref 150–400)
RBC: 3.94 MIL/uL (ref 3.87–5.11)
RDW: 14.2 % (ref 11.5–15.5)
WBC: 8.7 10*3/uL (ref 4.0–10.5)

## 2017-11-14 LAB — BASIC METABOLIC PANEL
Anion gap: 10 (ref 5–15)
BUN: 24 mg/dL — ABNORMAL HIGH (ref 6–20)
CALCIUM: 9.2 mg/dL (ref 8.9–10.3)
CO2: 24 mmol/L (ref 22–32)
Chloride: 95 mmol/L — ABNORMAL LOW (ref 101–111)
Creatinine, Ser: 0.53 mg/dL (ref 0.44–1.00)
Glucose, Bld: 99 mg/dL (ref 65–99)
POTASSIUM: 3.6 mmol/L (ref 3.5–5.1)
SODIUM: 129 mmol/L — AB (ref 135–145)

## 2017-11-14 LAB — BPAM RBC
Blood Product Expiration Date: 201906282359
Blood Product Expiration Date: 201906282359
ISSUE DATE / TIME: 201906021502
ISSUE DATE / TIME: 201906030140
UNIT TYPE AND RH: 5100
Unit Type and Rh: 5100

## 2017-11-14 LAB — HAPTOGLOBIN: Haptoglobin: 224 mg/dL — ABNORMAL HIGH (ref 34–200)

## 2017-11-14 LAB — TYPE AND SCREEN
ABO/RH(D): O POS
Antibody Screen: NEGATIVE
Unit division: 0
Unit division: 0

## 2017-11-14 LAB — MAGNESIUM: MAGNESIUM: 2 mg/dL (ref 1.7–2.4)

## 2017-11-14 MED ORDER — MORPHINE SULFATE (PF) 2 MG/ML IV SOLN
1.0000 mg | INTRAVENOUS | Status: DC | PRN
  Administered 2017-11-14 – 2017-11-16 (×6): 2 mg via INTRAVENOUS
  Administered 2017-11-16: 1 mg via INTRAVENOUS
  Administered 2017-11-16 – 2017-11-20 (×4): 2 mg via INTRAVENOUS
  Filled 2017-11-14 (×11): qty 1

## 2017-11-14 MED ORDER — TRACE MINERALS CR-CU-MN-SE-ZN 10-1000-500-60 MCG/ML IV SOLN
INTRAVENOUS | Status: AC
  Administered 2017-11-14: 18:00:00 via INTRAVENOUS
  Filled 2017-11-14: qty 1800

## 2017-11-14 MED ORDER — FAT EMULSION PLANT BASED 20 % IV EMUL
240.0000 mL | INTRAVENOUS | Status: AC
  Administered 2017-11-14: 240 mL via INTRAVENOUS
  Filled 2017-11-14: qty 250

## 2017-11-14 MED ORDER — ACETAMINOPHEN 500 MG PO TABS
1000.0000 mg | ORAL_TABLET | Freq: Three times a day (TID) | ORAL | Status: DC
  Administered 2017-11-14 – 2017-11-24 (×31): 1000 mg via ORAL
  Filled 2017-11-14 (×31): qty 2

## 2017-11-14 MED ORDER — POTASSIUM CHLORIDE 10 MEQ/50ML IV SOLN
10.0000 meq | INTRAVENOUS | Status: AC
  Administered 2017-11-14 (×3): 10 meq via INTRAVENOUS
  Filled 2017-11-14 (×3): qty 50

## 2017-11-14 MED ORDER — ALPRAZOLAM 0.25 MG PO TABS
0.2500 mg | ORAL_TABLET | Freq: Three times a day (TID) | ORAL | Status: DC | PRN
  Administered 2017-11-14 – 2017-11-22 (×6): 0.25 mg via ORAL
  Filled 2017-11-14 (×7): qty 1

## 2017-11-14 MED ORDER — GABAPENTIN 300 MG PO CAPS
300.0000 mg | ORAL_CAPSULE | Freq: Three times a day (TID) | ORAL | Status: DC
  Administered 2017-11-14 – 2017-11-24 (×30): 300 mg via ORAL
  Filled 2017-11-14 (×30): qty 1

## 2017-11-14 NOTE — Progress Notes (Signed)
Nutrition Follow-up  DOCUMENTATION CODES:   Non-severe (moderate) malnutrition in context of chronic illness  INTERVENTION:    Husband to bring [place] Protein supplement from home  Monitor for diet advancement/toleration  Encourage PO intake  NUTRITION DIAGNOSIS:   Moderate Malnutrition related to cancer and cancer related treatments, chronic illness as evidenced by moderate fat depletion, moderate muscle depletion.  Ongoing  GOAL:   Patient will meet greater than or equal to 90% of their needs  Meeting with TPN  MONITOR:   Other (Comment), Skin, Labs, Weight trends, I & O's(TPN tolerance)  REASON FOR ASSESSMENT:   Consult New TPN/TNA  ASSESSMENT:   62 y.o. F with metastatic breast cancer currenlty undergoing treatment with rectal metastases admitted on 11/03/17 for small bowel obstruction. 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 PSH of diverting loop colostomy due to rectal mass - 2018.    5/29- laparoscopy, small bowel resection, insertion of G-tube, partial colectomy with new colostomy 5/31- diet advanced to clear liquids, but pt did not tolerate 6/3- G tube clamped, pt's diet advanced to full liquid  Pt attempted to take bites of bone broth, chocolate pudding, and cream of wheat. She felt almost immediate abdominal pain after. She has not tried anything else this morning out of fear. Discussed possible supplement options that may sit better for now. She does not like any supplement the hospital can provide but husband is willing to bring her organic protein from home. Plan is for pt to stay on full liquids today and possibly advanced to soft tomorrow.   Pt continues to receive Clinimix E 5/15 at a goal rate of 75 ml/hr + 20% fat emulsion at 37ml/hr to provide:90 g/day protein, 1758 Kcal/day. This meets 100% of needs. Will look into weaning TPN with increased PO consumption.   Weight noted to increase 6 lb since last RD visit  on 5/30 (114 lb to 120 lb). Will continue to monitor trends.   Medications reviewed and include: 80 mg lasix once/day, 5 mg Reglan QID Labs reviewed: Na 129 (L) AST 52 (H) PAB <5 (L)  Diet Order:   Diet Order           Diet full liquid Room service appropriate? Yes; Fluid consistency: Thin  Diet effective now          EDUCATION NEEDS:   Education needs have been addressed  Skin:  Skin Assessment: Reviewed RN Assessment  Last BM:  11/12/17- 200 ml colostomy  Height:   Ht Readings from Last 1 Encounters:  11/08/17 5' (1.524 m)    Weight:   Wt Readings from Last 1 Encounters:  11/14/17 120 lb 13 oz (54.8 kg)    Ideal Body Weight:  45.45 kg  BMI:  Body mass index is 23.59 kg/m.  Estimated Nutritional Needs:   Kcal:  1700-1800 kcal  Protein:  85-95 grams  Fluid:  >/= 1.6 L or per MD    Mariana Single RD, LDN Clinical Nutrition Pager # [phone]

## 2017-11-14 NOTE — Progress Notes (Signed)
[facility] NOTE   Pharmacy Consult for TPN Indication: Intolerance to enteral feeding  Patient Measurements: Height: 5' (152.4 cm) Weight: 120 lb 13 oz (54.8 kg) IBW/kg (Calculated) : 45.5 TPN AdjBW (KG): 49 Body mass index is 23.59 kg/m.   Insulin Requirements: none, DC SSI/CBGs 6/3  Current Nutrition: GT clamped 6/3, FLD ordered, no intake recorded in EPIC  IVF: none ordered  Central access: PICC 5/25 TPN start date: 5/28  ASSESSMENT                                                                                                          HPI:  Patient has metastatic breast cancer and partial small bowel obstruction on full liquids for 4 months, now intolerance to clear liquids.  Significant events:  5/29 surgery 5/31 advanced to [facility]D but did not tolerate 6/3 GT clamped, FLD ordered  Today:   Glucose - at goal of <150  Electrolytes -Na 139 (see nephrologist note-  IV albumin & salt tabs d/cd 6/3, IV lasix)  K 3, mag 1.8, phos 3.1  Renal - WNL  LFTs - AST, Alk phos and total bioi a tiny bit elevated 6/3  TGs - 152, 157 ( 6/3)  Prealbumin - 10.7 ( 5/29);  < 5 6/3   GI: no stool output recorded for 6/3, on IV Pepcid, FLD ordered, no intake recorded in EPIC  NUTRITIONAL GOALS                                                                                             RD recs: 5/30 1700-1800 Kcal and 85-95 gm protein Clinimix E 5/15 at a goal rate of 75 ml/hr + 20% fat emulsion at 38m/hr to provide: 90 g/day protein, 1758 Kcal/day.  PLAN       KCl 162m IV x 3                                                                                                    At 1800 today:  continue Clinimix E 5/15 at 75 ml/hr.to meet 100% of goals  20% fat emulsion at 2068mr.  TPN to contain standard multivitamins and trace elements.  TPN lab panels on Mondays & Thursdays.  F/u daily.  MicSharyn Lull  Nicanor Alcon,  Pharm.D. [phone] 11/14/2017 8:15 AM

## 2017-11-14 NOTE — Progress Notes (Signed)
[facility] Progress Note  6 Days Post-Op  Subjective: CC:  C/o pain around abd incision/G-tube. Afraid to eat for fear it will make her pain worse. Tolerated clears yesterday without nausea or emesis. Having liquid stool and gas in colostomy pouch. Mobilizing in hallway. Reports agitation/"crawling out of my skin" with thorazine.   Objective: Vital signs in last 24 hours: Temp:  [98.7 F (37.1 C)-99.7 F (37.6 C)] 99 F (37.2 C) (06/04 0618) Pulse Rate:  [111-128] 128 (06/04 0618) Resp:  [16-18] 16 (06/04 0618) BP: (111-146)/(84-97) 146/84 (06/04 0618) SpO2:  [95 %-96 %] 96 % (06/04 0618) Weight:  [54.8 kg (120 lb 13 oz)] 54.8 kg (120 lb 13 oz) (06/04 0618) Last BM Date: 11/12/17  Intake/Output from previous day: 06/03 0701 - 06/04 0700 In: 2381.7 [I.V.:1916.7; IV Piggyback:465] Out: 151 [Urine:151] Intake/Output this shift: No intake/output data recorded.  PE: Gen:  Alert, NAD, pleasant Pulm:  Normal effort, clear to auscultation bilaterally Abd: Soft, appropriately tender, wound VAC midline, G-tube clamped, colostomy pouch in place with liquid stool in pouch, stoma viable.  Skin: warm and dry, no rashes  Psych: A&Ox3    Lab Results:  Recent Labs    11/12/17 0418 11/12/17 1114 11/13/17 0648  WBC 4.1  --  5.2  HGB 7.1* 6.6* 9.9*  HCT 19.2* 18.1* 27.8*  PLT 179  --  160   BMET Recent Labs    11/13/17 0648 11/14/17 0431  NA 130* 129*  K 3.0* 3.6  CL 96* 95*  CO2 24 24  GLUCOSE 99 99  BUN 27* 24*  CREATININE 0.64 0.53  CALCIUM 9.4 9.2   PT/INR No results for input(s): LABPROT, INR in the last 72 hours. CMP     Component Value Date/Time   NA 129 (L) 11/14/2017 0431   NA 134 (L) 12/28/2016 1503   K 3.6 11/14/2017 0431   K 4.0 12/28/2016 1503   CL 95 (L) 11/14/2017 0431   CL 98 12/03/2012 1419   CO2 24 11/14/2017 0431   CO2 25 12/28/2016 1503   GLUCOSE 99 11/14/2017 0431   GLUCOSE 96 12/28/2016 1503   GLUCOSE 89 12/03/2012 1419   BUN  24 (H) 11/14/2017 0431   BUN 12.0 12/28/2016 1503   CREATININE 0.53 11/14/2017 0431   CREATININE 0.81 09/13/2017 1157   CREATININE 0.8 12/28/2016 1503   CALCIUM 9.2 11/14/2017 0431   CALCIUM 9.7 12/28/2016 1503   PROT 5.5 (L) 11/13/2017 0648   PROT 6.5 12/28/2016 1503   ALBUMIN 3.8 11/13/2017 0648   ALBUMIN 3.9 12/28/2016 1503   AST 52 (H) 11/13/2017 0648   AST 25 09/13/2017 1157   AST 22 12/28/2016 1503   ALT 21 11/13/2017 0648   ALT 12 09/13/2017 1157   ALT 20 12/28/2016 1503   ALKPHOS 146 (H) 11/13/2017 0648   ALKPHOS 76 12/28/2016 1503   BILITOT 1.5 (H) 11/13/2017 0648   BILITOT 0.6 09/13/2017 1157   BILITOT 0.50 12/28/2016 1503   GFRNONAA >60 11/14/2017 0431   GFRNONAA >60 09/13/2017 1157   GFRAA >60 11/14/2017 0431   GFRAA >60 09/13/2017 1157   Lipase     Component Value Date/Time   LIPASE 37 11/03/2017 1828       Studies/Results: No results found.  Anti-infectives: Anti-infectives (From admission, onward)   Start     Dose/Rate Route Frequency Ordered Stop   11/09/17 0000  cefoTEtan (CEFOTAN) 2 g in sodium chloride 0.9 % 100 mL IVPB     2  g 200 mL/hr over 30 Minutes Intravenous Every 12 hours 11/08/17 1748 11/09/17 0021   11/08/17 1021  sodium chloride 0.9 % with cefoTEtan (CEFOTAN) ADS Med    Note to Pharmacy:  Marchia Meiers   : cabinet override      11/08/17 1021 11/08/17 2229   11/08/17 0600  cefoTEtan (CEFOTAN) 2 g in sodium chloride 0.9 % 100 mL IVPB  Status:  Discontinued    Note to Pharmacy:  Pharmacy may adjust dose strength for optimal dosing.   Send with patient on call to the OR.  Anesthesia to complete antibiotic administration <68min prior to incision per [facility].   2 g 200 mL/hr over 30 Minutes Intravenous On call to O.R. 11/07/17 1234 11/07/17 1433   11/08/17 0600  cefoTEtan in Dextrose 5% (CEFOTAN) IVPB 2 g     2 g Intravenous On call to O.R. 11/07/17 1505 11/08/17 1207   11/07/17 1500  cefoTEtan in Dextrose 5% (CEFOTAN) IVPB 2 g   Status:  Discontinued     2 g Intravenous On call to O.R. 11/07/17 1433 11/07/17 1505       Assessment/Plan S/p Procedure(s): LAPAROSCOPY DIAGNOSTIC (N/A) SMALL BOWEL RESECTION (N/A) INSERTION OF GASTROSTOMY TUBE (N/A) LYSIS OF ADHESION (N/A) PARTIAL COLECTOMY WITH NEW COLOSTOMY (N/A)  metastatic lobular carcinoma of the breasts/p left mastectomy and sentinel node biopsy in 2011; metastasis to pelvis last year s/p laparoscopic diverting loop sigmoid colostomy 2018 in [place]. -oncologist: Dr. [name]; plans to resumeexemestane/ abemaciclib when she can take full liquids pSBO 2/2 metastatic breast CA w/ known pelvic metastasis S/P diagnostic laparoscopy, LOA, SBR, insertion gastrostomy tube, partial colectomy new colostomy 11/08/17 Dr. Johney Maine - Afebrile, sinus tachy - issues with pain control - morphine q4h not controlling her pain. Start PO pain control - add scheduled tylenol and resume home gabapentin/xanax. Morphine PRN. - VAC MWF and ostomy appliance being followed by [facility] RN - plan to d/c VAC prior to hospital discharge. - having some bowel function, G-tube clamped - full liquid diet  - pepcid for reflux - mobilize/IS  FEN: TNA, clamp gastrostomy tube; cotinue full liquids today, possible advancement to SOFT tomorrow.  VTE: SCD's, Lovenox ID: perioperative cefotetan  Foley: removed 5/31 (POD#2)    LOS: 11 days    Jill Alexanders , [facility] 11/14/2017, 9:16 AM Pager: [phone] Consults: [phone] Mon-Fri 7:00 am-4:30 pm Sat-Sun 7:00 am-11:30 am

## 2017-11-14 NOTE — Progress Notes (Signed)
PROGRESS NOTE    Sonia Hill  JJO:[id number] DOB: July 06, 1955 DOA: 11/03/2017 PCP: [name], MD    Brief Narrative:  13 female-metastatic breast cancer (magrinat)-on Verzenio Colostomy + rectal mass 04/2017-complicated by SBO-recurrent SBO 06/2017 and currently on liquid diet at home  Admit severe excruciating abdominal pain no ostomy output CT = worsening cancer-blood pressures stable Main lab abnormalities potassium 2.5 calcium 5.6 glucose 55 treated initially No NG tube-General surgery and oncology have consulted on the patient It appears that patient may be going for exploratory surgery 5/28 and is n.p.o. 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  Patient subsequently underwent laparoscopic diagnostic small bowel resection, insertion of gastrostomy tube, lysis of adhesion, partial colectomy with new colostomy per Dr. Johney Maine 11/08/2017.  NG tube has been discontinued per general surgery.  IV Robaxin has been added to patient's regimen.  Patient noted to have some bowel  function.  Patient currently tolerating a full liquid diet.  Diet likely to be advanced to a soft diet tomorrow if patient continues to tolerate a full liquid diet.  Pain regimen has been adjusted per general surgery.   #2 hyponatremia Initially felt likely secondary to hypervolemic hyponatremia plus or minus SIADH.  Patient noted to have lower extremity edema as well as low albumin levels.  Patient given IV Lasix however worsening sodium levels down to 123  from 127 from 129.  TSH within normal limits.  Cortisol level within normal limits.  Discontinued IV Lasix.  Nephrology consulted to assessed the patient and feel hyponatremia may be secondary to hypervolemia with anasarca.  Patient started on IV albumin and low-dose salt tablets as well as compression stockings.  Patient started back on IV Lasix and dose increased to 80 mg daily.  Sodium levels improvement in sodium currently at 129.  Once tolerating oral intake likely transition to oral Lasix 40 mg daily.  Nephrology following and appreciate input and recommendations.   3.  Hypokalemia/hypocalcemia/hypomagnesemia Continue TPN.  Electrolytes being repleted per pharmacy.    4.  Hypothyroidism  TSH 3.123.  Continue IV Synthroid.  Once patient has been started on a diet and tolerating will transition to home dose oral Synthroid.    5.  Metastatic estrogen positive lobular breast cancer Patient with metastatic spread to the spine and rectal area resulting in blockage and diverging colostomy.  Oncology following.   6.  Solitary kidney hydronephrosis Status post Foley catheter placement.  Renal function stable.  Nephrology following.  Urology consulted and no further work-up needed during this hospitalization.  Outpatient follow-up.    7.  Anxiety Ativan as needed.  8  severe protein calorie malnutrition On TPN.  9.  Volume overload Patient with lower extremity edema.  Likely component of hypoalbuminemia.  Current weight is 120 pounds from 123  pounds from 130 pounds from 123 pounds from 115 pounds from 114 pounds from 108 pounds on 11/04/2017.  Blood pressure somewhat borderline.  Lasix was discontinued due to worsening sodium levels.  Patient was given dose of IV albumin per nephrology on 11/11/2017.  IV Lasix has been resumed per nephrology and dose increased to 80 mg daily.  Urine output not accurately recorded.  Strict I's and O's.  Daily weights.  Follow.   10.  New left bundle branch block Noted on EKG the night of 11/08/2017.  Patient denies any chest pain no shortness of breath.  2D echo with a EF of 65 to 70%, no wall motion abnormalities, left ventricular diastolic function normal.  Patient has been seen in consultation by cardiology and no further ischemic work-up recommended at this time.   11.  Hiccups Likely secondary to malignant bowel obstruction.  Patient on Reglan which has been placed 3 times daily at a lower dose per oncology.  Patient given a dose of Thorazine however was having's some side effects from it and as such Thorazine has been discontinued.  Patient given Cogentin 1 mg IV every 12 hours x2 doses.  Clinical improvement with hiccups.  12 anemia Hemoglobin this morning 11.4 from 9.9 from 7.1 from 9.3 on 11/10/2017.  Patient is status post 2 units packed red blood cells 11/12/2017.  Patient denies any overt bleeding.  Colostomy with small brown stool.  FOBT was positive.  Anemia panel has been ordered and looks like patient does have a severe iron deficiency anemia with iron level less than 5.   IV Feraheme x1.  LDH, haptoglobin, hepatic panel not consistent with hemolysis.  CT abdomen and pelvis was not done.  Patient will be seen in consultation by gastroenterology who are recommending serial CBCs.  Transfuse as needed.  Follow.         DVT prophylaxis: Lovenox. Code Status: Full Family Communication: Updated patient and husband at bedside. Disposition Plan: To be determined.    Consultants:   General Surgery:  [name] 11/04/2017  Palliative care: Dr. Rowe Pavy 10/30/2017  Cardiology: Dr.Skains 11/10/2017  Nephrology: Dr: Jonnie Finner 11/11/2017  Gastroenterology: Dr. Cristina Gong 11/12/2017  Urology: Dr. Alinda Money 11/13/2017  Procedures:   CT abdomen and pelvis 11/03/2017  Abdominal films 11/07/2017, 11/05/2017, 11/02/2017  Laparoscopic diagnostic small bowel resection, insertion of gastrostomy tube, lysis of adhesion, partial colectomy with new colostomy per Dr. Johney Maine 11/08/2017  2D echo 11/10/2017  2 units packed red blood cells 11/12/2017  Antimicrobials:   None   Subjective: Patient on bedside commode.  States adjustment of pain medication helping.  Denies any further hiccups.  Denies any shortness of breath.  No chest pain.  Tolerating current full liquid diet.    Objective: Vitals:   11/13/17 0630 11/13/17 1531 11/13/17 2136 11/14/17 0618  BP: 139/67 111/86 (!) 144/97 (!) 146/84  Pulse: (!) 111 (!) 111 (!) 125 (!) 128  Resp: 16 18 18 16   Temp: 98.3 F (36.8 C) 98.7 F (37.1 C) 99.7 F (37.6 C) 99 F (37.2 C)  TempSrc: Oral Oral Oral Oral  SpO2: 99% 96% 95% 96%  Weight:    54.8 kg (120 lb 13 oz)  Height:        Intake/Output Summary (Last 24 hours) at 11/14/2017 1334 Last data filed at 11/14/2017 1239 Gross  per 24 hour  Intake 2381.67 ml  Output 351 ml  Net 2030.67 ml   Filed Weights   11/12/17 0500 11/13/17 0500 11/14/17 0618  Weight: 59.2 kg (130 lb 8.2 oz) 56.1 kg (123 lb 10.9 oz) 54.8 kg (120 lb 13 oz)    Examination:  General exam: NAD Respiratory system: Diffuse scattered crackles.  No rhonchi.  No wheezing.  Normal respiratory effort.  Cardiovascular system: Tachycardia.  2+ bilateral lower extremity edema. Gastrointestinal system: Abdomen with wound VAC intact.  Small brown stool liquid noted in colostomy bag.  Soft, some diffuse tenderness to palpation, hypoactive bowel sounds.  Central nervous system: Alert and oriented. No focal neurological deficits. Extremities: 2+  bilateral lower extremity edema.  Symmetric 5 x 5 power. Skin: No rashes, lesions or ulcers Psychiatry: Judgement and insight appear normal. Mood & affect appropriate.     Data Reviewed: I have personally reviewed following labs and imaging studies  CBC: Recent Labs  Lab 11/08/17 0424  11/09/17 0440 11/10/17 0442 11/12/17 0418 11/12/17 1114 11/13/17 0648 11/14/17 1000  WBC 4.7  --  2.3* 7.7 4.1  --  5.2 8.7  NEUTROABS 3.4  --   --  7.0  --   --  4.2 7.4  HGB 11.7*   < > 10.2* 9.3* 7.1* 6.6* 9.9* 11.4*  HCT 35.1*   < > 30.0* 27.4* 19.2* 18.1* 27.8* 32.3*  MCV 85.2  --  86.2 84.0 80.0  --  82.2 82.0  PLT 271  --  206 199 179  --  160 164   < > = values in this interval not displayed.   Basic Metabolic Panel: Recent Labs  Lab 11/08/17 0424 11/09/17 0440 11/10/17 0442 11/11/17 0508 11/12/17 0418 11/13/17 0648 11/14/17 0431  NA 127* 129* 127* 123* 125* 130* 129*  K 3.9 3.9 3.7 4.1 3.9 3.0* 3.6  CL 93* 100* 96* 93* 92* 96* 95*  CO2 26 23 26 24 24 24 24   GLUCOSE 95 175* 108* 106* 88 99 99  BUN 11 21* 28* 31* 29* 27* 24*  CREATININE 1.02* 0.94 0.89 0.90 0.70 0.64 0.53  CALCIUM 8.8* 8.0* 8.4* 8.9 9.2 9.4 9.2  MG 2.1 1.8 2.1 2.0  --  1.8 2.0  PHOS 3.4 3.5 3.0 2.5  --  3.1  --    GFR: Estimated Creatinine Clearance: 56.6 mL/min (by C-G formula based on SCr of 0.53 mg/dL). Liver Function Tests: Recent Labs  Lab 11/08/17 0424 11/09/17 0440 11/12/17 1353 11/13/17 0648  AST 31 25 36 52*  ALT 13* 8* 11* 21  ALKPHOS 64 37* 79 146*  BILITOT 0.3 0.1* 0.8 1.5*  PROT 5.2* 3.8* 5.2* 5.5*  ALBUMIN 2.9* 2.3* 3.7 3.8   No results for input(s): LIPASE, AMYLASE in the last 168 hours. No results for input(s): AMMONIA in the last 168 hours. Coagulation Profile: No results for input(s): INR, PROTIME in the last 168 hours. Cardiac Enzymes: Recent Labs  Lab 11/08/17 2351  TROPONINI <0.03   BNP (last 3 results) No results for input(s): PROBNP in the last 8760  hours. HbA1C: No results for input(s): HGBA1C in the last 72 hours. CBG: Recent Labs  Lab 11/12/17 1205 11/12/17 1747 11/12/17 2344 11/13/17 0634 11/13/17 1128  GLUCAP 88 81 105* 91 90   Lipid Profile: Recent Labs    11/13/17 0648  TRIG 157*   Thyroid Function Tests: No results for input(s): TSH, T4TOTAL, FREET4, T3FREE, THYROIDAB in the last 72 hours. Anemia Panel: Recent Labs  11/12/17 1114  VITAMINB12 428  FOLATE 7.3  FERRITIN 94  TIBC 89*  IRON <5*  RETICCTPCT 0.8   Sepsis Labs: No results for input(s): PROCALCITON, LATICACIDVEN in the last 168 hours.  Recent Results (from the past 240 hour(s))  MRSA PCR Screening     Status: None   Collection Time: 11/07/17  2:43 PM  Result Value Ref Range Status   MRSA by PCR NEGATIVE NEGATIVE Final    Comment:        The GeneXpert MRSA Assay (FDA approved for NASAL specimens only), is one component of a comprehensive MRSA colonization surveillance program. It is not intended to diagnose MRSA infection nor to guide or monitor treatment for MRSA infections. Performed at [facility], [place] [address], [place], [place] [address]   Culture, Urine     Status: Abnormal   Collection Time: 11/08/17  9:54 AM  Result Value Ref Range Status   Specimen Description   Final    URINE, CLEAN CATCH Performed at [facility], [place] [address], [place], [place] [address]    Special Requests   Final    NONE Performed at [facility], [place] [address], [place], [place] [address]    Culture (A)  Final    <10,000 COLONIES/mL INSIGNIFICANT GROWTH Performed at [facility] [address] [place], [place] [address]    Report Status 11/09/2017 FINAL  Final  Culture, Urine     Status: Abnormal   Collection Time: 11/11/17  8:18 AM  Result Value Ref Range Status   Specimen Description   Final    URINE, RANDOM Performed at [facility] [address] [place], [place] [address]    Special Requests   Final    NONE Performed at [facility], [place] [address] [place], [place] [address]    Culture 30,000 COLONIES/mL PSEUDOMONAS AERUGINOSA (A)  Final   Report Status 11/13/2017 FINAL  Final   Organism ID, Bacteria PSEUDOMONAS AERUGINOSA (A)  Final      Susceptibility   Pseudomonas aeruginosa - MIC*    CEFTAZIDIME <=1 SENSITIVE Sensitive     CIPROFLOXACIN <=0.25 SENSITIVE Sensitive     GENTAMICIN <=1 SENSITIVE Sensitive     IMIPENEM 1 SENSITIVE Sensitive     PIP/TAZO <=4 SENSITIVE Sensitive     CEFEPIME <=1 SENSITIVE Sensitive     * 30,000 COLONIES/mL PSEUDOMONAS AERUGINOSA         Radiology Studies: No results found.      Scheduled Meds: . acetaminophen  1,000 mg Oral Q8H  . furosemide  80 mg Intravenous Daily  . gabapentin  300 mg Oral TID  . levothyroxine  50 mcg Intravenous Daily  . lip balm  1 application Topical BID  . LORazepam  0.5 mg Intravenous Q24H  . metoCLOPramide (REGLAN) injection  5 mg Intravenous TID AC & HS   Continuous Infusions: . famotidine (PEPCID) IV 20 mg (11/14/17 1133)  . Marland KitchenTPN (CLINIMIX-E) Adult     And  . Fat emulsion    . methocarbamol (ROBAXIN)  IV Stopped (11/14/17 0530)  . Marland KitchenTPN (CLINIMIX-E) Adult 75 mL/hr at 11/13/17 1725     LOS: 11 days    Time spent: 40 minutes    Irine Seal, MD [facility] Pager [phone]  If 7PM-7AM, please contact night-coverage [email or web address] Password [email or web address] 11/14/2017, 1:34 PM

## 2017-11-15 LAB — BASIC METABOLIC PANEL
ANION GAP: 4 — AB (ref 5–15)
BUN: 25 mg/dL — AB (ref 6–20)
CALCIUM: 8.8 mg/dL — AB (ref 8.9–10.3)
CO2: 29 mmol/L (ref 22–32)
CREATININE: 0.51 mg/dL (ref 0.44–1.00)
Chloride: 95 mmol/L — ABNORMAL LOW (ref 101–111)
GFR calc Af Amer: >60 mL/min (ref >60)
GLUCOSE: 109 mg/dL — AB (ref 65–99)
Potassium: 3.5 mmol/L (ref 3.5–5.1)
Sodium: 128 mmol/L — ABNORMAL LOW (ref 135–145)

## 2017-11-15 LAB — CBC
HCT: 29.6 % — ABNORMAL LOW (ref 36.0–46.0)
Hemoglobin: 10.5 g/dL — ABNORMAL LOW (ref 12.0–15.0)
MCH: 28.8 pg (ref 26.0–34.0)
MCHC: 35.5 g/dL (ref 30.0–36.0)
MCV: 81.3 fL (ref 78.0–100.0)
PLATELETS: 191 10*3/uL (ref 150–400)
RBC: 3.64 MIL/uL — ABNORMAL LOW (ref 3.87–5.11)
RDW: 14.5 % (ref 11.5–15.5)
WBC: 9.6 10*3/uL (ref 4.0–10.5)

## 2017-11-15 LAB — MAGNESIUM: MAGNESIUM: 1.9 mg/dL (ref 1.7–2.4)

## 2017-11-15 LAB — PHOSPHORUS: Phosphorus: 3.4 mg/dL (ref 2.5–4.6)

## 2017-11-15 MED ORDER — EXEMESTANE 25 MG PO TABS
25.0000 mg | ORAL_TABLET | Freq: Every day | ORAL | Status: DC
  Administered 2017-11-16 – 2017-11-24 (×9): 25 mg via ORAL
  Filled 2017-11-15 (×9): qty 1

## 2017-11-15 MED ORDER — TRACE MINERALS CR-CU-MN-SE-ZN 10-1000-500-60 MCG/ML IV SOLN
INTRAVENOUS | Status: AC
  Administered 2017-11-15: 17:00:00 via INTRAVENOUS
  Filled 2017-11-15: qty 1800

## 2017-11-15 MED ORDER — FAT EMULSION PLANT BASED 20 % IV EMUL
240.0000 mL | INTRAVENOUS | Status: AC
  Administered 2017-11-15: 240 mL via INTRAVENOUS
  Filled 2017-11-15: qty 250

## 2017-11-15 MED ORDER — ENSURE ENLIVE PO LIQD
237.0000 mL | Freq: Three times a day (TID) | ORAL | Status: DC
  Administered 2017-11-15 – 2017-11-19 (×5): 237 mL via ORAL

## 2017-11-15 NOTE — Consult Note (Signed)
[place] Nurse ostomy follow up Stoma type/location: Colostomy pouch intact with good seal, will plan to change on Fri. Pt is familiar with pouching routines since she had one prior to admission and denies further questions. She was using a 2 piece Hollister system and barrier ring and can continue to use the same pouches, opening will need to be cut slightly larger and more oval than she was previously doing.  Output:small amt liquid  brown stool Ostomy pouching: 2pc.2 1/4" pouch with barrier ring Education provided:none needed.  Enrolled patient in [place] Start Discharge program: Yes, Pt was enrolled in a program for supplies prior to admission. 5 sets of supplies left at the bedside for staff nurse use.  [place] Nurse wound consult note Surgical team following for assessment and plan of care to abd wound. Reason for Consult:midline abdominal wound with NPWT VAC dressing change Wound type: full thickness midline surgical wound; continues to decrease in size and depth. 6X2X.4cm  Do not feel that patient will need the Vac upon discharge. Wound OZY:YQMGN red Drainage (amount, consistency, odor)minimal amt pink drainage in the cannister Periwound: ostomy, PEG tube are in close proximity to the Vac dressing. Pt medicated for pain prior to the procedure and tolerated with minimal mat discomfort.  Applied one piece black foam to 159mm cont suction. WOC will plan to change Q M/W/F  Julien Girt MSN, RN, [place], [place], [place]

## 2017-11-15 NOTE — Progress Notes (Signed)
Sonia Hill   DOB:April 08, 1956   ME#:[id number]   WKG#:[id number]  Subjective:  Sonia Hill was a bit sleepy today due to pain medicines she received in connection with wound vac dressing change. She aroused easily, appears comfortable, tells me she tolerated full liquids yesterday and is hoping to progress her diet today; ambulated x2; friend in room  Objective: middle aged White woman examined in bed Vitals:   11/14/17 2152 11/15/17 0622  BP: 133/75 (!) 152/81  Pulse: (!) 113 (!) 114  Resp: 18 14  Temp: 98 F (36.7 C)   SpO2: 98% 97%    Body mass index is 23.77 kg/m.  Intake/Output Summary (Last 24 hours) at 11/15/2017 1134 Last data filed at 11/15/2017 0434 Gross per 24 hour  Intake 2204.41 ml  Output 200 ml  Net 2004.41 ml   Brown liquid, slight solid  30 cc content in colostomy bag Abd wounds clean and dry  CBG (last 3)  Recent Labs    11/12/17 2344 11/13/17 0634 11/13/17 1128  GLUCAP 105* 91 90     Labs:  Lab Results  Component Value Date   WBC 9.6 11/15/2017   HGB 10.5 (L) 11/15/2017   HCT 29.6 (L) 11/15/2017   MCV 81.3 11/15/2017   PLT 191 11/15/2017   NEUTROABS 7.4 11/14/2017    _0 @  Urine Studies No results for input(s): UHGB, CRYS in the last 72 hours.  Invalid input(s): UACOL, UAPR, USPG, UPH, UTP, UGL, UKET, UBIL, UNIT, UROB, [place], UEPI, Glorianne [place], [place], [place]  Basic Metabolic Panel: Recent Labs  Lab 11/09/17 0440 11/10/17 0442 11/11/17 4585 11/12/17 0418 11/13/17 0648 11/14/17 0431 11/15/17 0445  NA 129* 127* 123* 125* 130* 129* 128*  K 3.9 3.7 4.1 3.9 3.0* 3.6 3.5  CL 100* 96* 93* 92* 96* 95* 95*  CO2 _1 GLUCOSE 175* 108* 106* 88 99 99 109*  BUN 21* 28* 31* 29* 27* 24* 25*  CREATININE 0.94 0.89 0.90 0.70 0.64 0.53 0.51  CALCIUM 8.0* 8.4* 8.9 9.2 9.4 9.2 8.8*  MG 1.8 2.1 2.0  --  1.8 2.0 1.9  PHOS 3.5 3.0 2.5  --  3.1  --  3.4   GFR Estimated Creatinine Clearance: 56.9 mL/min (by C-G formula  based on SCr of 0.51 mg/dL). Liver Function Tests: Recent Labs  Lab 11/09/17 0440 11/12/17 1353 11/13/17 0648  AST 25 36 52*  ALT 8* 11* 21  ALKPHOS 37* 79 146*  BILITOT 0.1* 0.8 1.5*  PROT 3.8* 5.2* 5.5*  ALBUMIN 2.3* 3.7 3.8   No results for input(s): LIPASE, AMYLASE in the last 168 hours. No results for input(s): AMMONIA in the last 168 hours. Coagulation profile No results for input(s): INR, PROTIME in the last 168 hours.  CBC: Recent Labs  Lab 11/10/17 0442 11/12/17 0418 11/12/17 1114 11/13/17 0648 11/14/17 1000 11/15/17 0445  WBC 7.7 4.1  --  5.2 8.7 9.6  NEUTROABS 7.0  --   --  4.2 7.4  --   HGB 9.3* 7.1* 6.6* 9.9* 11.4* 10.5*  HCT 27.4* 19.2* 18.1* 27.8* 32.3* 29.6*  MCV 84.0 80.0  --  82.2 82.0 81.3  PLT 199 179  --  160 164 191   Cardiac Enzymes: Recent Labs  Lab 11/08/17 2351  TROPONINI <0.03   BNP: Invalid input(s): POCBNP CBG: Recent Labs  Lab 11/12/17 1205 11/12/17 1747 11/12/17 2344 11/13/17 0634 11/13/17 1128  GLUCAP 88 81 105* 91 90   D-Dimer  No results for input(s): DDIMER in the last 72 hours. Hgb A1c No results for input(s): HGBA1C in the last 72 hours. Lipid Profile Recent Labs    11/13/17 0648  TRIG 157*   Thyroid function studies No results for input(s): TSH, T4TOTAL, T3FREE, THYROIDAB in the last 72 hours.  Invalid input(s): FREET3 Anemia work up No results for input(s): VITAMINB12, FOLATE, FERRITIN, TIBC, IRON, RETICCTPCT in the last 72 hours. Microbiology Recent Results (from the past 240 hour(s))  MRSA PCR Screening     Status: None   Collection Time: 11/07/17  2:43 PM  Result Value Ref Range Status   MRSA by PCR NEGATIVE NEGATIVE Final    Comment:        The GeneXpert MRSA Assay (FDA approved for NASAL specimens only), is one component of a comprehensive MRSA colonization surveillance program. It is not intended to diagnose MRSA infection nor to guide or monitor treatment for MRSA infections. Performed at  [facility], [place] [address] [place], [place] [address]   Culture, Urine     Status: Abnormal   Collection Time: 11/08/17  9:54 AM  Result Value Ref Range Status   Specimen Description   Final    URINE, CLEAN CATCH Performed at [facility], [place] [address] [place], [place] [address]    Special Requests   Final    NONE Performed at [facility], [place] [address], [place], [place] [address]    Culture (A)  Final    <10,000 COLONIES/mL INSIGNIFICANT GROWTH Performed at [place] [address], [place], [place] [address]    Report Status 11/09/2017 FINAL  Final  Culture, Urine     Status: Abnormal   Collection Time: 11/11/17  8:18 AM  Result Value Ref Range Status   Specimen Description   Final    URINE, RANDOM Performed at [place] [address] [place], [place] [address]    Special Requests   Final    NONE Performed at [facility], [place] [address] [place], [place] [address]    Culture 30,000 COLONIES/mL PSEUDOMONAS AERUGINOSA (A)  Final   Report Status 11/13/2017 FINAL  Final   Organism ID, Bacteria PSEUDOMONAS AERUGINOSA (A)  Final      Susceptibility   Pseudomonas aeruginosa - MIC*    CEFTAZIDIME <=1 SENSITIVE Sensitive     CIPROFLOXACIN <=0.25 SENSITIVE Sensitive     GENTAMICIN <=1 SENSITIVE Sensitive     IMIPENEM 1 SENSITIVE Sensitive     PIP/TAZO <=4 SENSITIVE Sensitive     CEFEPIME <=1 SENSITIVE Sensitive     * 30,000 COLONIES/mL PSEUDOMONAS AERUGINOSA      Studies:  Ct Abdomen Pelvis Wo Contrast  Result Date: 11/03/2017 CLINICAL DATA:  Abdominal distension with decreased ostomy output EXAM: CT ABDOMEN AND PELVIS WITHOUT CONTRAST TECHNIQUE: Multidetector CT imaging of the abdomen and pelvis was performed following the standard protocol without IV contrast. COMPARISON:  CT 07/10/2017, 05/22/2017, radiograph 11/02/2017 FINDINGS: Lower chest: Lung bases  demonstrate small bilateral pleural effusions. No consolidation. Normal heart size. Hepatobiliary: No focal liver abnormality is seen. No gallstones, gallbladder wall thickening, or biliary dilatation. Pancreas: Unremarkable. No pancreatic ductal dilatation or surrounding inflammatory changes. Spleen: Normal in size without focal abnormality. Adrenals/Urinary Tract: Right adrenal gland is normal. Mild to moderate right hydronephrosis and hydroureter as before. Absent left kidney. Bladder within normal limits. Stomach/Bowel: Mild-to-moderate fluid distention of stomach. Multiple dilated loops of proximal to mid small bowel,  measuring up to 4.5 cm. Abduct transition to decompressed distal small bowel in the right mid to lower abdomen, series 2, image number 43, with minimal contrast within decompressed small bowel loops distal to this. No appreciable contrast in the colon. Status post left lower quadrant double-barrel colostomy. Heterogeneous soft tissue thickening at the anus and rectum, corresponding to previously noted mass. Vascular/Lymphatic: Nonaneurysmal aorta. Limited evaluation for adenopathy due to diffuse abdominal fluid. Reproductive: No adnexal mass. Uterus inseparable from soft tissue mass at the rectum and anus. Other: No free air. Increased abdominal and pelvic ascites, now moderate. Diffuse subcutaneous edema. Musculoskeletal: No acute or suspicious abnormality. IMPRESSION: 1. Multiple dilated loops of small bowel with abduct transition to decompressed distal small bowel in the mid to lower abdomen slightly to the right of midline, consistent with small bowel obstruction. A small amount of contrast does pass into decompressed small bowel distal to the point of obstruction. 2. Interim development of small pleural effusions. Development of moderate abdominopelvic ascites. Diffuse subcutaneous edema consistent with anasarca 3. Status post left lower quadrant colostomy. Soft tissue enlargement of the anus  and rectum, corresponding to previously demonstrated infiltrative mass in the region. 4. Similar mild to moderate right hydronephrosis and slightly dilated right ureter. Electronically Signed   By: Donavan Foil M.D.   On: 11/03/2017 21:41   Dg Abd 1 View  Result Date: 11/03/2017 CLINICAL DATA:  Small-bowel obstruction EXAM: ABDOMEN - 1 VIEW COMPARISON:  07/14/2017 FINDINGS: Moderate small bowel dilatation. Colon decompressed. Left lower quadrant ostomy. No renal calculi. Normal skeletal structures. IMPRESSION: Small-bowel obstruction. Electronically Signed   By: Franchot Gallo M.D.   On: 11/03/2017 08:44   US Renal  Result Date: 10/20/2017 CLINICAL DATA:  62 year old female with a history of hydronephrosis. Left-sided nephrectomy EXAM: RENAL / URINARY TRACT ULTRASOUND COMPLETE COMPARISON:  CT 07/10/2017, MR 10/05/2017 FINDINGS: Right Kidney: Length: 11.8 cm. Hydronephrosis. No significant renal cortical thinning. Echogenicity is similar to that of the adjacent liver parenchyma. Left Kidney: Left nephrectomy Bladder: Urinary bladder is decompressed. IMPRESSION: Right-sided hydronephrosis. Left nephrectomy. Electronically Signed   By: Corrie Mckusick D.O.   On: 10/20/2017 09:33   Dg Chest Port 1 View  Result Date: 11/11/2017 CLINICAL DATA:  Acute kidney injury.  Chest pain. EXAM: PORTABLE CHEST 1 VIEW COMPARISON:  10/31/2016 FINDINGS: Right arm PICC tip in the SVC 4 cm above the right atrium. Bilateral pleural effusions with dependent atelectasis. Upper lungs are clear. Previous mastectomy on the left. No acute bone finding. IMPRESSION: Bilateral effusions with dependent pulmonary atelectasis. Electronically Signed   By: Nelson Chimes M.D.   On: 11/11/2017 15:17   Dg Abd 2 Views  Result Date: 11/07/2017 CLINICAL DATA:  62 year old female with small bowel obstruction. Recent colonic surgery. Subsequent encounter. EXAM: ABDOMEN - 2 VIEW COMPARISON:  11/05/2017 plain film examination.  11/03/2017 CT.  FINDINGS: Gas and fluid distended dilated small bowel loops more prominent than on the recent plain film examination with small bowel measuring up to 4.7 cm versus prior 4.5 cm. Small bowel fold thickening. Air-fluid levels. Interval development of gas filled stomach. Minimal residual contrast within colon. Colostomy left lower quadrant. No free intraperitoneal air noted. IMPRESSION: Progressive partial small bowel obstructive pattern. Interval development of gas-filled stomach. No free intraperitoneal air. These results will be called to the ordering clinician or representative by the Radiologist Assistant, and communication documented in the PACS or zVision Dashboard. Electronically Signed   By: Genia Del M.D.   On: 11/07/2017 09:05  Dg Abd 2 Views  Result Date: 11/05/2017 CLINICAL DATA:  Per order- small bowel obstruction PT HX: GERD, bladder sugery EXAM: ABDOMEN - 2 VIEW COMPARISON:  CT 11/03/2017 and previous FINDINGS: Visualized lung bases clear.  No free air. Multiple dilated small bowel loops with fluid levels on the erect radiograph. Similar number of involved loops and degree of dilatation since previous radiograph 11/02/2017. There has been progression of oral contrast material into the proximal colon which is nondilated. There is a left lower quadrant ostomy device. No abnormal abdominal calcifications.  Regional bones unremarkable. IMPRESSION: 1. Findings consistent with persistent partial mid/distal small bowel obstruction. 2. No free air. Electronically Signed   By: Lucrezia Europe M.D.   On: 11/05/2017 12:23   [place] Ekg Site Rite  Result Date: 11/04/2017 If Site Rite image not attached, placement could not be confirmed due to current cardiac rhythm.   Assessment: 62 y.o.  BRCA 1-2 negative [facility] woman with lobular breast cancer stage IV at presentation July 2011, admitted with recurren SBO in the setting of abdominal carcinomatosis  (1) status post left mastectomy and sentinel lymph  node sampling in July 2011 for a lower inner quadrant T1 N1 M1, stage IV invasive lobular breast cancer, grade 1, strongly estrogen and progesterone receptor-positive, HER2 negative with MIB-1 of 9% and no HER2 amplification,   (2) with multiple sclerotic bone lesions at presentation seen only on CT scan (not on bone scan or PET scan), but  with biopsy-proven metastatic disease to bone; and an elevated CA 27.29 at presentation,   (3) Oncotype recurrence score of 4, predicts a good response to antiestrogens.  (4) Systemic treatment has consisted of             a) tamoxifen with evidence of response but poor tolerance             b) letrozole starting August 2012, discontinued October 2014 per patient              (5) single functioning kidney  (6) status post bilateral salpingo-oophorectomy 01/24/2013, with benign pathology  (7) osteoporosis; the patient refuses bisphosphonate therapy; started [facility] February 2014.               (a) bone density was obtained under the care of Dr. Matthew Saras at [facility], 06/25/2012, showing osteoporosis with a T score -2.6. This was repeated 12/20/2013, showing again osteoporosis with T-scores between -2.4 and -2.8.             (b) the patient refuses zolendronate, denosumab, or other pharmacologic intervention             (c) bone density on 12/30/2015 under Dr. Matthew Saras showing osteoporosis with T score -3.3  (8) the patient refuses standard Mammography or tomography; undergoing thermography screening of the right breast.  (9) PET scan 06/05/2015 shows rectal thickening and a presacral mass; biopsy of this area 10/09/2015 confirms metastatic lobular breast cancer, again estrogen receptor positive, HER-2 nonamplified.             (a) pelvic MRI 03/11/2016 confirms stability of disease             (b) chest CT and pelvic MRI 09/02/2016 shows no change in the circumferential rectal thickening or evidence of extension beyond the  serosa and chest CT findings             (c) colonoscopy 02/23/2017 showed a very narrow rectal lumen with a few apparently uninvolved centimeters  distally              (d) CT of the abdomen and pelvis 05/22/2017 (after diverting colostomy) shows interval increase in the size of the perirectal soft tissue masses.  (10) started fulvestrant and palbociclib 125 mg/ day [21/7] May 2017             (a) palbociclib dose decreased to 100 mg daily [21/7] with second cycle, started 11/25/2015             (b) palbociclib discontinued 03/20/2017 with evidence of disease progression             (c) last fulvestrant dose 03/24/2017, discontinued with evidence of progression  (11) status post colostomy placement [facility] November 2018  (12) started exemestane 07/05/2017,             (a) everolimus 5 mg/d started 07/14/2017--held as of 08/20/2017 secondary to rash             (b) exemestane stopped by patient 09/04/2017 to try "the natural way".  (13) exemestane resumed 10/16/2017  (a) abemaciclib/Verzenio added 10/18/2017  (14) s/p laparoscopic small bowel resection for SBO 11/08/2017    Plan:  Moreen appears to be turning the corner. I will resume exemestane at this point. Will hold abemaciclib until after discharge (she has a home supply)  Encouraged her to sit up in recliner and walk in halls as tolerated.  Greatly appreciate your help to this complex patient!   Chauncey Cruel, MD 11/15/2017  11:34 AM Medical Oncology and Hematology [facility] [address] [place], [place] [address] Tel. [phone]    Fax. [phone]

## 2017-11-15 NOTE — Progress Notes (Signed)
[facility] Progress Note  7 Days Post-Op  Subjective: CC:  Denies any nausea. Tolerating full liquids, eating small amount at each meal. C/o abdominal pain around tubes/incisions, but mostly having pressure from intra-abdominal fluid. Mobilizing. Urinating with some hesitancy.  Objective: Vital signs in last 24 hours: Temp:  [97.7 F (36.5 C)-98 F (36.7 C)] 98 F (36.7 C) (06/04 2152) Pulse Rate:  [107-114] 114 (06/05 0622) Resp:  [14-18] 14 (06/05 0622) BP: (125-152)/(75-81) 152/81 (06/05 0622) SpO2:  [97 %-98 %] 97 % (06/05 0622) Weight:  [55.2 kg (121 lb 11.2 oz)] 55.2 kg (121 lb 11.2 oz) (06/05 0622) Last BM Date: 11/14/17  Intake/Output from previous day: 06/04 0701 - 06/05 0700 In: 2304.4 [P.O.:120; I.V.:2024.4; IV Piggyback:160] Out: 200 [Urine:100; Stool:100] Intake/Output this shift: No intake/output data recorded.  PE: Gen:  Alert, NAD, pleasant Pulm:  Normal effort, clear to auscultation bilaterally Abd: Soft, appropriately tender, edematous, wound VAC midline, G-tube clamped, colostomy pouch in place with a lot of gas in pouch, stoma viable.  Skin: warm and dry, no rashes  Psych: A&Ox3       Lab Results:  Recent Labs    11/14/17 1000 11/15/17 0445  WBC 8.7 9.6  HGB 11.4* 10.5*  HCT 32.3* 29.6*  PLT 164 191   BMET Recent Labs    11/14/17 0431 11/15/17 0445  NA 129* 128*  K 3.6 3.5  CL 95* 95*  CO2 24 29  GLUCOSE 99 109*  BUN 24* 25*  CREATININE 0.53 0.51  CALCIUM 9.2 8.8*   PT/INR No results for input(s): LABPROT, INR in the last 72 hours. CMP     Component Value Date/Time   NA 128 (L) 11/15/2017 0445   NA 134 (L) 12/28/2016 1503   K 3.5 11/15/2017 0445   K 4.0 12/28/2016 1503   CL 95 (L) 11/15/2017 0445   CL 98 12/03/2012 1419   CO2 29 11/15/2017 0445   CO2 25 12/28/2016 1503   GLUCOSE 109 (H) 11/15/2017 0445   GLUCOSE 96 12/28/2016 1503   GLUCOSE 89 12/03/2012 1419   BUN 25 (H) 11/15/2017 0445   BUN 12.0  12/28/2016 1503   CREATININE 0.51 11/15/2017 0445   CREATININE 0.81 09/13/2017 1157   CREATININE 0.8 12/28/2016 1503   CALCIUM 8.8 (L) 11/15/2017 0445   CALCIUM 9.7 12/28/2016 1503   PROT 5.5 (L) 11/13/2017 0648   PROT 6.5 12/28/2016 1503   ALBUMIN 3.8 11/13/2017 0648   ALBUMIN 3.9 12/28/2016 1503   AST 52 (H) 11/13/2017 0648   AST 25 09/13/2017 1157   AST 22 12/28/2016 1503   ALT 21 11/13/2017 0648   ALT 12 09/13/2017 1157   ALT 20 12/28/2016 1503   ALKPHOS 146 (H) 11/13/2017 0648   ALKPHOS 76 12/28/2016 1503   BILITOT 1.5 (H) 11/13/2017 0648   BILITOT 0.6 09/13/2017 1157   BILITOT 0.50 12/28/2016 1503   GFRNONAA >60 11/15/2017 0445   GFRNONAA >60 09/13/2017 1157   GFRAA >60 11/15/2017 0445   GFRAA >60 09/13/2017 1157   Lipase     Component Value Date/Time   LIPASE 37 11/03/2017 1828       Studies/Results: No results found.  Anti-infectives: Anti-infectives (From admission, onward)   Start     Dose/Rate Route Frequency Ordered Stop   11/09/17 0000  cefoTEtan (CEFOTAN) 2 g in sodium chloride 0.9 % 100 mL IVPB     2 g 200 mL/hr over 30 Minutes Intravenous Every 12 hours 11/08/17 1748 11/09/17 0021  11/08/17 1021  sodium chloride 0.9 % with cefoTEtan (CEFOTAN) ADS Med    Note to Pharmacy:  Marchia Meiers   : cabinet override      11/08/17 1021 11/08/17 2229   11/08/17 0600  cefoTEtan (CEFOTAN) 2 g in sodium chloride 0.9 % 100 mL IVPB  Status:  Discontinued    Note to Pharmacy:  Pharmacy may adjust dose strength for optimal dosing.   Send with patient on call to the OR.  Anesthesia to complete antibiotic administration <54min prior to incision per [facility].   2 g 200 mL/hr over 30 Minutes Intravenous On call to O.R. 11/07/17 1234 11/07/17 1433   11/08/17 0600  cefoTEtan in Dextrose 5% (CEFOTAN) IVPB 2 g     2 g Intravenous On call to O.R. 11/07/17 1505 11/08/17 1207   11/07/17 1500  cefoTEtan in Dextrose 5% (CEFOTAN) IVPB 2 g  Status:  Discontinued     2 g  Intravenous On call to O.R. 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 -oncologist: Dr. [name]; plans to resumeexemestane/ abemaciclib when she can take full liquids pSBO 2/2 metastatic breast CA w/ known pelvic metastasis S/P diagnostic laparoscopy, LOA, SBR, insertion gastrostomy tube, partial colectomy new colostomy 11/08/17 Dr. Johney Maine - Afebrile, sinus tachy -  PO pain control - ad scheduled tylenol, resume home gabapentin/xanax. Morphine PRN. - VAC MWF and ostomy appliance being followed by Penn Wynne RN - plan to d/c VAC prior to hospital discharge. - having bowel function, G-tube clamped - advance diet  - pepcid for reflux - mobilize/IS  FEN: continue TNA, G tube clampes; advance to SOFT diet, TID protein takes; will start to wean TNA if pt able to tolerate enough PO intake - will consider calorie count tomorrow. VTE: SCD's, Lovenox ID: perioperative cefotetan  Foley: removed 5/31 (POD#2)     LOS: 12 days    Jill Alexanders , [facility] 11/15/2017, 9:15 AM Pager: [phone] Consults: [phone] Mon-Fri 7:00 am-4:30 pm Sat-Sun 7:00 am-11:30 am

## 2017-11-15 NOTE — Progress Notes (Signed)
PROGRESS NOTE    Sonia WISSINGER  PYK:[id number] DOB: 1955-09-07 DOA: 11/03/2017 PCP: [name], MD   Brief Narrative:  The patient is a 49 female with metastatic breast cancer who sees Dr. Elza Rafter, Hx of Colostomy + rectal mass 04/2017-complicated by SBO-recurrent SBO 06/2017 and currently on liquid diet at home. She was admitted with severe excruciating abdominal pain no ostomy output. CT Abdomen showed worsening cancer. Underwent Surgery on 5/29 and had Diagnostic Laparoscopy, Lysis of Adhesions, Small Bowel Resection, Insertion of Gastrotomy Tube and Partial Colectomy with new Colostomy. Currently on TPN and diet being advanced to soft Diet.  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    Ascites   New onset left bundle branch block (LBBB)   Metastatic breast cancer to small intestine causing SBO s/p SB resection 11/08/2017   Metastatic breast cancer to colostomy s/p colectomy/colostomy 11/08/2017   Cachexia ([place])   Malnutrition of moderate degree   Anemia  Malignant bowel obstruction/small bowel obstruction secondary to Breast Cancer Metastasis status post small bowel resection 11/08/2017 -Patient noted to have no significant improvement with small bowel obstruction and subsequently underwent laparoscopic diagnostic small bowel resection, insertion of gastrostomy tube, lysis of adhesion, partial colectomy with new colostomy per Dr. Johney Maine 11/08/2017.   -NG tube has been discontinued per general surgery.   -IV Robaxin 500 mg q8hhas been added to  patient's regimen.   -Patient noted to have some bowel function.   -Patient currently tolerating a full liquid diet and being advanced toa Soft Diet today -Pain regimen has been adjusted per General Surgery; Continue current regimen  Hyponatremia -Initially felt likely secondary to hypervolemic hyponatremia plus or minus SIADH.   -Patient noted to have lower extremity edema as well as low albumin levels.   -Patient given IV Lasix however worsening sodium levels down to 123  from 127 from 129; IV Lasix discontinued and on po Lasix now -TSH within normal limits.   -Cortisol level within normal limits.  - Nephrology consulted to assessed the patient and feel hyponatremia may be secondary to hypervolemia with anasarca.   -Patient started on IV albumin and low-dose salt tablets as well as compression stockings but Salt Tablets have been stopped now -Sodium levels improvement in sodium currently at 128.  -Transition to oral Lasix 40 mg daily once tolerating soft Diet but will continue IV Furosemide 80 mg daily for now.   -Nephrology following and appreciate input and recommendations.   Hypokalemia/hypocalcemia/hypomagnesemia -Continue TPN.   -Electrolytes being repleted per pharmacy.    Hypothyroidism  -TSH 3.123.  Continue IV Synthroid.  -Once patient has been started on a diet and tolerating will transition to home dose oral Synthroid and likley in AM.    Metastatic estrogen positive lobular breast cancer -Patient with metastatic spread to the spine and rectal area resulting in blockage and diverging colostomy.   -Oncology following and feel as if she is turning the corner -Dr. [name] recommending resuming Exemestane at this point and continue to hold Abemaciclib   Solitary kidney with Hydronephrosis -Status post Foley catheter placement.   -Renal function  stable. Urology consulted and no further work-up needed during this hospitalization.  Outpatient follow-up.     Anxiety Ativan as needed.  Severe protein calorie malnutrition -On TPN. -Nutrition is consulted and appreciate further evaluation recommendations -Continue Ensure Enlive 237 mL's p.o. 3 times daily with meals  Volume Overload -Patient with lower extremity edema.   -Likely component of hypoalbuminemia. -Lasix was discontinued due to worsening sodium levels.  Patient was given dose of IV albumin per nephrology on 11/11/2017.   -IV Lasix has been resumed per nephrology and dose increased to 80 mg daily and will go to 40 mg po Daily once tolerating soft Diet.   -Urine output not accurately recorded.   -Continue to MonitorStrict I's and O's.  Daily weights. -Patient is +18.279 Liters since Admission and Weight is now 121 (up from 108 on Admission)  New left bundle branch block -Noted on EKG the night of 11/08/2017.   -Patient denies any chest pain no shortness of breath.   -2D echo with a EF of 65 to 70%, no wall motion abnormalities, left ventricular diastolic function normal.   -Patient has been seen in consultation by cardiology and no further ischemic work-up recommended at this time.   Singultus/Hiccups, improved -Likely secondary to malignant bowel obstruction.   -Patient on Reglan which has been placed 3 times daily at a lower dose per oncology but now Stopped.  -Patient given a dose of Thorazine however was having's some side effects from it and as such Thorazine has been discontinued. -Patient given Cogentin 1 mg IV every 12 hours x2 doses.   -Clinical improvement with hiccups noted  Normocytic Anemia -Patient is status post 2 units packed red blood cells 11/12/2017. -Patient denies any overt bleeding.   -Colostomy with small brown stool.  FOBT was positive.  Anemia panel has been ordered and looks like patient does have a severe iron deficiency anemia with iron level less than 5.   I -V Feraheme x1.  LDH, haptoglobin, hepatic panel not consistent with hemolysis.  CT abdomen  and pelvis was not done.   -Patient was seen in consultation by Gastroenterology who are recommending serial CBCs.  Transfuse as needed.   -Hb/Hct stable at 10.5/29.6 -Continue to Monitor for S/Sx of Bleeding -Repeat CBC in AM  DVT prophylaxis: SCDs Code Status: FULL CODE Family Communication: Discussed with Husband and Friend at bedside Disposition Plan: Anticipate D/C Home in next 1-2 days if medically stable  Consultants:   General Surgery: [name] 11/04/2017  Palliative care: Dr. Rowe Pavy 10/30/2017  Cardiology: Dr.Skains 11/10/2017  Nephrology: Dr: Jonnie Finner 11/11/2017  Gastroenterology: Dr. Cristina Gong 11/12/2017  Urology: Dr. Alinda Money 11/13/2017  Pharmacy for TNA  Procedures: S/P diagnostic laparoscopy, LOA, SBR, insertion gastrostomy tube, partial colectomy new colostomy 11/08/17 Dr. Johney Maine  Antimicrobials:  Anti-infectives (From admission, onward)   Start     Dose/Rate Route Frequency Ordered Stop   11/09/17 0000  cefoTEtan (CEFOTAN) 2 g in sodium chloride 0.9 % 100 mL IVPB     2 g 200 mL/hr over 30 Minutes Intravenous Every 12 hours 11/08/17 1748 11/09/17 0021   11/08/17 1021  sodium chloride 0.9 % with cefoTEtan (CEFOTAN) ADS Med    Note to Pharmacy:  Marchia Meiers   : cabinet override      11/08/17 1021 11/08/17 2229   11/08/17 0600  cefoTEtan (CEFOTAN) 2 g in sodium chloride 0.9 % 100 mL IVPB  Status:  Discontinued    Note to Pharmacy:  Pharmacy may adjust dose strength for  optimal dosing.   Send with patient on call to the OR.  Anesthesia to complete antibiotic administration <50min prior to incision per [facility].   2 g 200 mL/hr over 30 Minutes Intravenous On call to O.R. 11/07/17 1234 11/07/17 1433   11/08/17 0600  cefoTEtan in Dextrose 5% (CEFOTAN) IVPB 2 g     2 g Intravenous On call to O.R. 11/07/17 1505 11/08/17 1207   11/07/17 1500  cefoTEtan in Dextrose 5% (CEFOTAN) IVPB 2 g  Status:  Discontinued     2 g Intravenous On call to O.R. 11/07/17 1433 11/07/17 1505      Subjective: Seen and examined this morning and did have some abdominal tenderness. No CP or SOB. Ambulating the Halls well without issue.  Objective: Vitals:   11/14/17 1455 11/14/17 2152 11/15/17 0622 11/15/17 1357  BP: 125/77 133/75 (!) 152/81 111/83  Pulse: (!) 107 (!) 113 (!) 114 (!) 116  Resp: 16 18 14 16   Temp: 97.7 F (36.5 C) 98 F (36.7 C)  98.7 F (37.1 C)  TempSrc: Oral Oral  Oral  SpO2: 98% 98% 97% 100%  Weight:   55.2 kg (121 lb 11.2 oz)   Height:        Intake/Output Summary (Last 24 hours) at 11/15/2017 1740 Last data filed at 11/15/2017 1711 Gross per 24 hour  Intake 3439.41 ml  Output 25 ml  Net 3414.41 ml   Filed Weights   11/13/17 0500 11/14/17 0618 11/15/17 0622  Weight: 56.1 kg (123 lb 10.9 oz) 54.8 kg (120 lb 13 oz) 55.2 kg (121 lb 11.2 oz)   Examination: Physical Exam:  Constitutional: Thin Caucasian NAD and appears calm and comfortable Eyes: Lids and conjunctivae normal, sclerae anicteric  ENMT: External Ears, Nose appear normal. Grossly normal hearing. Mucous membranes are moist. Neck: Appears normal, supple, no cervical masses, normal ROM, no appreciable thyromegaly Respiratory: Diminished to auscultation bilaterally, no wheezing, rales, rhonchi or crackles. Normal respiratory effort and patient is not tachypenic. No accessory muscle use.  Cardiovascular: RRR, no murmurs / rubs / gallops. S1 and S2 auscultated. 1+ LE extremity edema. 2+ pedal pulses. No carotid bruits.  Abdomen: Soft, Slightly tender, non-distended. Bowel sounds positive x4. Has a Colostomy with brown stool and a Wound Vac in place. GU: Deferred. Musculoskeletal: No clubbing / cyanosis of digits/nails. No joint deformity upper and lower extremities. G Skin: No rashes, lesions, ulcers on a limited skin eval. No induration; Warm and dry.  Neurologic: CN 2-12 grossly intact with no focal deficits. Romberg sign and cerebellar reflexes not assessed.  Psychiatric: Normal judgment and  insight. Alert and oriented x 3. Normal mood and appropriate affect.   Data Reviewed: I have personally reviewed following labs and imaging studies  CBC: Recent Labs  Lab 11/10/17 0442 11/12/17 0418 11/12/17 1114 11/13/17 0648 11/14/17 1000 11/15/17 0445  WBC 7.7 4.1  --  5.2 8.7 9.6  NEUTROABS 7.0  --   --  4.2 7.4  --   HGB 9.3* 7.1* 6.6* 9.9* 11.4* 10.5*  HCT 27.4* 19.2* 18.1* 27.8* 32.3* 29.6*  MCV 84.0 80.0  --  82.2 82.0 81.3  PLT 199 179  --  160 164 673   Basic Metabolic Panel: Recent Labs  Lab 11/09/17 0440 11/10/17 0442 11/11/17 0508 11/12/17 0418 11/13/17 0648 11/14/17 0431 11/15/17 0445  NA 129* 127* 123* 125* 130* 129* 128*  K 3.9 3.7 4.1 3.9 3.0* 3.6 3.5  CL 100* 96* 93* 92* 96* 95* 95*  CO2  23 26 24 24 24 24 29   GLUCOSE 175* 108* 106* 88 99 99 109*  BUN 21* 28* 31* 29* 27* 24* 25*  CREATININE 0.94 0.89 0.90 0.70 0.64 0.53 0.51  CALCIUM 8.0* 8.4* 8.9 9.2 9.4 9.2 8.8*  MG 1.8 2.1 2.0  --  1.8 2.0 1.9  PHOS 3.5 3.0 2.5  --  3.1  --  3.4   GFR: Estimated Creatinine Clearance: 56.9 mL/min (by C-G formula based on SCr of 0.51 mg/dL). Liver Function Tests: Recent Labs  Lab 11/09/17 0440 11/12/17 1353 11/13/17 0648  AST 25 36 52*  ALT 8* 11* 21  ALKPHOS 37* 79 146*  BILITOT 0.1* 0.8 1.5*  PROT 3.8* 5.2* 5.5*  ALBUMIN 2.3* 3.7 3.8   No results for input(s): LIPASE, AMYLASE in the last 168 hours. No results for input(s): AMMONIA in the last 168 hours. Coagulation Profile: No results for input(s): INR, PROTIME in the last 168 hours. Cardiac Enzymes: Recent Labs  Lab 11/08/17 2351  TROPONINI <0.03   BNP (last 3 results) No results for input(s): PROBNP in the last 8760 hours. HbA1C: No results for input(s): HGBA1C in the last 72 hours. CBG: Recent Labs  Lab 11/12/17 1205 11/12/17 1747 11/12/17 2344 11/13/17 0634 11/13/17 1128  GLUCAP 88 81 105* 91 90   Lipid Profile: Recent Labs    11/13/17 0648  TRIG 157*   Thyroid Function  Tests: No results for input(s): TSH, T4TOTAL, FREET4, T3FREE, THYROIDAB in the last 72 hours. Anemia Panel: No results for input(s): VITAMINB12, FOLATE, FERRITIN, TIBC, IRON, RETICCTPCT in the last 72 hours. Sepsis Labs: No results for input(s): PROCALCITON, LATICACIDVEN in the last 168 hours.  Recent Results (from the past 240 hour(s))  MRSA PCR Screening     Status: None   Collection Time: 11/07/17  2:43 PM  Result Value Ref Range Status   MRSA by PCR NEGATIVE NEGATIVE Final    Comment:        The GeneXpert MRSA Assay (FDA approved for NASAL specimens only), is one component of a comprehensive MRSA colonization surveillance program. It is not intended to diagnose MRSA infection nor to guide or monitor treatment for MRSA infections. Performed at [facility], [place] [address] [place], [place] [address]   Culture, Urine     Status: Abnormal   Collection Time: 11/08/17  9:54 AM  Result Value Ref Range Status   Specimen Description   Final    URINE, CLEAN CATCH Performed at [facility], [place] [address] [place], [place] [address]    Special Requests   Final    NONE Performed at [facility], [place] [address], [place], [place] [address]    Culture (A)  Final    <10,000 COLONIES/mL INSIGNIFICANT GROWTH Performed at [place] [address], [place], [place] [address]    Report Status 11/09/2017 FINAL  Final  Culture, Urine     Status: Abnormal   Collection Time: 11/11/17  8:18 AM  Result Value Ref Range Status   Specimen Description   Final    URINE, RANDOM Performed at [place] [address] [place], [place] [address]    Special Requests   Final    NONE Performed at [facility], [place] [address], [place], [place] [address]    Culture 30,000 COLONIES/mL PSEUDOMONAS AERUGINOSA (A)  Final   Report Status 11/13/2017 FINAL  Final   Organism ID, Bacteria PSEUDOMONAS  AERUGINOSA (A)  Final      Susceptibility   Pseudomonas aeruginosa - MIC*    CEFTAZIDIME <=1 SENSITIVE Sensitive     CIPROFLOXACIN <=0.25 SENSITIVE Sensitive     GENTAMICIN <=1 SENSITIVE Sensitive     IMIPENEM 1 SENSITIVE Sensitive     PIP/TAZO <=4 SENSITIVE Sensitive     CEFEPIME <=1 SENSITIVE Sensitive     * 30,000 COLONIES/mL PSEUDOMONAS AERUGINOSA    Radiology Studies: No results found.  Scheduled Meds: . acetaminophen  1,000 mg Oral Q8H  . [START ON 11/16/2017] exemestane  25 mg Oral QPC breakfast  . feeding supplement (ENSURE ENLIVE)  237 mL Oral TID WC  . furosemide  80 mg Intravenous Daily  . gabapentin  300 mg Oral TID  . levothyroxine  50 mcg Intravenous Daily  . lip balm  1 application Topical BID  . LORazepam  0.5 mg Intravenous Q24H   Continuous Infusions: . famotidine (PEPCID) IV Stopped (11/15/17 1030)  . Marland KitchenTPN (CLINIMIX-E) Adult 75 mL/hr at 11/15/17 1711   And  . Fat emulsion 240 mL (11/15/17 1711)  . methocarbamol (ROBAXIN)  IV Stopped (11/15/17 1412)  . Marland KitchenTPN (CLINIMIX-E) Adult Stopped (11/15/17 1710)    LOS: 12 days   [name], DO [facility] Pager [phone]  If 7PM-7AM, please contact night-coverage [email or web address] Password [email or web address] 11/15/2017, 5:40 PM

## 2017-11-15 NOTE — Progress Notes (Signed)
[facility] NOTE   Pharmacy Consult for TPN Indication: Intolerance to enteral feeding  Patient Measurements: Height: 5' (152.4 cm) Weight: 121 lb 11.2 oz (55.2 kg) IBW/kg (Calculated) : 45.5 TPN AdjBW (KG): 49 Body mass index is 23.77 kg/m.   Insulin Requirements: none, DC SSI/CBGs 6/3  Current Nutrition: GT clamped 6/3, FLD ordered, no intake recorded in EPIC  IVF: none ordered  Central access: PICC 5/25 TPN start date: 5/28  ASSESSMENT                                                                                                          HPI:  Patient has metastatic breast cancer and partial small bowel obstruction on full liquids for 4 months, now intolerance to clear liquids.  Significant events:  5/29 surgery 5/31 advanced to CLD but did not tolerate 6/3 GT clamped, FLD ordered 6/5 advanced to soft diet  Today:   Glucose - at goal of <150  Electrolytes -Na 128 (see nephrologist note-  IV albumin & salt tabs d/cd 6/3, IV lasix)  K 3.5, mag 1.9, phos 3.4  Renal - WNL  LFTs - AST, Alk phos and total bili a tiny bit elevated 6/3  TGs - 152, 157 ( 6/3)  Prealbumin - 10.7 ( 5/29);  < 5 6/3    NUTRITIONAL GOALS                                                                                             RD recs: 5/30 1700-1800 Kcal and 85-95 gm protein Clinimix E 5/15 at a goal rate of 75 ml/hr + 20% fat emulsion at 58m/hr to provide: 90 g/day protein, 1758 Kcal/day.  PLAN                                                                                                         At 1800 today:  continue Clinimix E 5/15 at 75 ml/hr.to meet 100% of goals  20% fat emulsion at 234mhr.  TPN to contain standard multivitamins and trace elements.  TPN lab panels on Mondays & Thursdays.  F/u daily.  ElDolly RiasPh 11/15/2017, 11:56 AM Pager [phone]

## 2017-11-16 ENCOUNTER — Inpatient Hospital Stay (HOSPITAL_COMMUNITY): Payer: 59

## 2017-11-16 ENCOUNTER — Encounter (HOSPITAL_COMMUNITY): Payer: Self-pay | Admitting: Radiology

## 2017-11-16 DIAGNOSIS — D72829 Elevated white blood cell count, unspecified: Secondary | ICD-10-CM

## 2017-11-16 DIAGNOSIS — R945 Abnormal results of liver function studies: Secondary | ICD-10-CM

## 2017-11-16 LAB — CBC WITH DIFFERENTIAL/PLATELET
Basophils Absolute: 0 10*3/uL (ref 0.0–0.1)
Basophils Absolute: 0 10*3/uL (ref 0.0–0.1)
Basophils Relative: 0 %
Basophils Relative: 0 %
EOS ABS: 0.2 10*3/uL (ref 0.0–0.7)
EOS PCT: 2 %
EOS PCT: 1 %
Eosinophils Absolute: 0.1 10*3/uL (ref 0.0–0.7)
HCT: 30.8 % — ABNORMAL LOW (ref 36.0–46.0)
HCT: 30.6 % — ABNORMAL LOW (ref 36.0–46.0)
Hemoglobin: 10.7 g/dL — ABNORMAL LOW (ref 12.0–15.0)
Hemoglobin: 10.8 g/dL — ABNORMAL LOW (ref 12.0–15.0)
LYMPHS ABS: 0.8 10*3/uL (ref 0.7–4.0)
LYMPHS PCT: 7 %
LYMPHS PCT: 5 %
Lymphs Abs: 1 10*3/uL (ref 0.7–4.0)
MCH: 29.2 pg (ref 26.0–34.0)
MCH: 28.7 pg (ref 26.0–34.0)
MCHC: 34.7 g/dL (ref 30.0–36.0)
MCHC: 35.3 g/dL (ref 30.0–36.0)
MCV: 83.9 fL (ref 78.0–100.0)
MCV: 81.4 fL (ref 78.0–100.0)
MONO ABS: 0.9 10*3/uL (ref 0.1–1.0)
Monocytes Absolute: 1.1 10*3/uL — ABNORMAL HIGH (ref 0.1–1.0)
Monocytes Relative: 6 %
Monocytes Relative: 7 %
NEUTROS PCT: 87 %
Neutro Abs: 12.1 10*3/uL — ABNORMAL HIGH (ref 1.7–7.7)
Neutro Abs: 15.2 10*3/uL — ABNORMAL HIGH (ref 1.7–7.7)
Neutrophils Relative %: 85 %
PLATELETS: 265 10*3/uL (ref 150–400)
PLATELETS: 290 10*3/uL (ref 150–400)
RBC: 3.67 MIL/uL — AB (ref 3.87–5.11)
RBC: 3.76 MIL/uL — AB (ref 3.87–5.11)
RDW: 14.7 % (ref 11.5–15.5)
RDW: 14.7 % (ref 11.5–15.5)
WBC: 14.2 10*3/uL — AB (ref 4.0–10.5)
WBC: 17.3 10*3/uL — AB (ref 4.0–10.5)

## 2017-11-16 LAB — URINALYSIS, ROUTINE W REFLEX MICROSCOPIC
Bilirubin Urine: NEGATIVE
GLUCOSE, UA: NEGATIVE mg/dL
Ketones, ur: NEGATIVE mg/dL
LEUKOCYTES UA: NEGATIVE
Nitrite: NEGATIVE
PH: 5 (ref 5.0–8.0)
Protein, ur: 30 mg/dL — AB
SPECIFIC GRAVITY, URINE: 1.023 (ref 1.005–1.030)

## 2017-11-16 LAB — COMPREHENSIVE METABOLIC PANEL
ALK PHOS: 471 U/L — AB (ref 38–126)
ALT: 108 U/L — ABNORMAL HIGH (ref 14–54)
AST: 108 U/L — ABNORMAL HIGH (ref 15–41)
Albumin: 2.5 g/dL — ABNORMAL LOW (ref 3.5–5.0)
Anion gap: 9 (ref 5–15)
BILIRUBIN TOTAL: 1.1 mg/dL (ref 0.3–1.2)
BUN: 26 mg/dL — ABNORMAL HIGH (ref 6–20)
CALCIUM: 8.7 mg/dL — AB (ref 8.9–10.3)
CO2: 26 mmol/L (ref 22–32)
CREATININE: 0.58 mg/dL (ref 0.44–1.00)
Chloride: 93 mmol/L — ABNORMAL LOW (ref 101–111)
Glucose, Bld: 99 mg/dL (ref 65–99)
Potassium: 3.5 mmol/L (ref 3.5–5.1)
Sodium: 128 mmol/L — ABNORMAL LOW (ref 135–145)
TOTAL PROTEIN: 5.1 g/dL — AB (ref 6.5–8.1)

## 2017-11-16 LAB — PHOSPHORUS: PHOSPHORUS: 3.6 mg/dL (ref 2.5–4.6)

## 2017-11-16 LAB — MAGNESIUM: Magnesium: 1.8 mg/dL (ref 1.7–2.4)

## 2017-11-16 MED ORDER — MORPHINE SULFATE (PF) 4 MG/ML IV SOLN
4.0000 mg | Freq: Once | INTRAVENOUS | Status: AC
  Administered 2017-11-16: 4 mg via INTRAVENOUS
  Filled 2017-11-16: qty 1

## 2017-11-16 MED ORDER — FUROSEMIDE 10 MG/ML IJ SOLN
80.0000 mg | Freq: Once | INTRAMUSCULAR | Status: AC
  Administered 2017-11-16: 80 mg via INTRAVENOUS
  Filled 2017-11-16: qty 8

## 2017-11-16 MED ORDER — FAMOTIDINE 20 MG PO TABS
20.0000 mg | ORAL_TABLET | Freq: Two times a day (BID) | ORAL | Status: DC
  Administered 2017-11-16 – 2017-11-24 (×17): 20 mg via ORAL
  Filled 2017-11-16 (×17): qty 1

## 2017-11-16 MED ORDER — FAT EMULSION PLANT BASED 20 % IV EMUL
240.0000 mL | INTRAVENOUS | Status: AC
  Administered 2017-11-16: 240 mL via INTRAVENOUS
  Filled 2017-11-16: qty 250

## 2017-11-16 MED ORDER — IOHEXOL 300 MG/ML  SOLN: 15.0000 mL | Freq: Once | INTRAMUSCULAR | Status: AC | PRN

## 2017-11-16 MED ORDER — FUROSEMIDE 40 MG PO TABS
40.0000 mg | ORAL_TABLET | Freq: Two times a day (BID) | ORAL | Status: DC
  Administered 2017-11-16 – 2017-11-17 (×2): 40 mg via ORAL
  Filled 2017-11-16 (×2): qty 1

## 2017-11-16 MED ORDER — METHOCARBAMOL 500 MG PO TABS
500.0000 mg | ORAL_TABLET | Freq: Three times a day (TID) | ORAL | Status: DC
  Administered 2017-11-16 – 2017-11-24 (×25): 500 mg via ORAL
  Filled 2017-11-16 (×25): qty 1

## 2017-11-16 MED ORDER — IOPAMIDOL (ISOVUE-300) INJECTION 61%
INTRAVENOUS | Status: AC
Start: 2017-11-16 — End: 2017-11-17
  Filled 2017-11-16: qty 100

## 2017-11-16 MED ORDER — OXYCODONE HCL 5 MG PO TABS
5.0000 mg | ORAL_TABLET | ORAL | Status: DC | PRN
  Administered 2017-11-16 – 2017-11-19 (×7): 5 mg via ORAL
  Filled 2017-11-16 (×9): qty 1

## 2017-11-16 MED ORDER — FUROSEMIDE 40 MG PO TABS: 40.0000 mg | ORAL_TABLET | Freq: Two times a day (BID) | ORAL | Status: DC

## 2017-11-16 MED ORDER — TRACE MINERALS CR-CU-MN-SE-ZN 10-1000-500-60 MCG/ML IV SOLN
INTRAVENOUS | Status: AC
  Administered 2017-11-16: 18:00:00 via INTRAVENOUS
  Filled 2017-11-16: qty 1800

## 2017-11-16 MED ORDER — IOPAMIDOL (ISOVUE-300) INJECTION 61%
100.0000 mL | Freq: Once | INTRAVENOUS | Status: AC | PRN
  Administered 2017-11-16: 100 mL via INTRAVENOUS

## 2017-11-16 NOTE — Discharge Instructions (Signed)
[facility], [place] [phone]  OPEN ABDOMINAL SURGERY: POST OP INSTRUCTIONS  Always review your discharge instruction sheet given to you by the facility where your surgery was performed.  IF YOU HAVE DISABILITY OR FAMILY LEAVE FORMS, YOU MUST BRING THEM TO THE OFFICE FOR PROCESSING.  PLEASE DO NOT GIVE THEM TO YOUR DOCTOR.  1. A prescription for pain medication may be given to you upon discharge.  Take your pain medication as prescribed, if needed.  If narcotic pain medicine is not needed, then you may take acetaminophen (Tylenol) or ibuprofen (Advil) as needed. 2. Take your usually prescribed medications unless otherwise directed. 3. If you need a refill on your pain medication, please contact your pharmacy. They will contact our office to request authorization.  Prescriptions will not be filled after 5pm or on week-ends. 4. You should follow a light diet the first few days after arrival home, such as soup and crackers, pudding, etc.unless your doctor has advised otherwise. A high-fiber, low fat diet can be resumed as tolerated.   Be sure to include lots of fluids daily. Most patients will experience some swelling and bruising on the chest and neck area.  Ice packs will help.  Swelling and bruising can take several days to resolve 5. Most patients will experience some swelling and bruising in the area of the incision. Ice pack will help. Swelling and bruising can take several days to resolve..  6. It is common to experience some constipation if taking pain medication after surgery.  Increasing fluid intake and taking a stool softener will usually help or prevent this problem from occurring.  A mild laxative (Milk of Magnesia or Miralax) should be taken according to package directions if there are no bowel movements after 48 hours. 7.  You may have steri-strips (small skin tapes) in place directly over the incision.  These strips should be left on the skin for 7-10 days.  If  your surgeon used skin glue on the incision, you may shower in 24 hours.  The glue will flake off over the next 2-3 weeks.  Any sutures or staples will be removed at the office during your follow-up visit. You may find that a light gauze bandage over your incision may keep your staples from being rubbed or pulled. You may shower and replace the bandage daily. 8. ACTIVITIES:  You may resume regular (light) daily activities beginning the next day--such as daily self-care, walking, climbing stairs--gradually increasing activities as tolerated.  You may have sexual intercourse when it is comfortable.  Refrain from any heavy lifting or straining until approved by your doctor. a. You may drive when you no longer are taking prescription pain medication, you can comfortably wear a seatbelt, and you can safely maneuver your car and apply brakes b. Return to Work: ___________________________________ 37. You should see your doctor in the office for a follow-up appointment approximately two weeks after your surgery.  Make sure that you call for this appointment within a day or two after you arrive home to insure a convenient appointment time. OTHER INSTRUCTIONS:  _____________________________________________________________ _____________________________________________________________  WHEN TO CALL YOUR DOCTOR: 1. Fever over 101.0 2. Inability to urinate 3. Nausea and/or vomiting 4. Extreme swelling or bruising 5. Continued bleeding from incision. 6. Increased pain, redness, or drainage from the incision. 7. Difficulty swallowing or breathing 8. Muscle cramping or spasms. 9. Numbness or tingling in hands or feet or around lips.  The clinic staff is available to  answer your questions during regular business hours.  Please dont hesitate to call and ask to speak to one of the nurses if you have concerns.  For further questions, please visit [email or web address]  MIDLINE WOUND CARE: - midline  dressing to be changed daily - supplies: normal saline, kerlix (Rolled gauze), scissors, ABD pads, tape  - remove dressing and all packing carefully, moistening with saline as needed to avoid packing/internal dressing sticking to the wound. - clean edges of skin around the wound with water/gauze, making sure there is no tape debris or leakage left on skin that could cause skin irritation or breakdown. - dampen a clean kerlix with sterile saline and pack wound from wound base to skin level, making sure to take note of any possible areas of wound tracking, tunneling and packing appropriately. Wound can be packed loosely. Trim kerlix to size if a whole kerlix is not required. - cover wound with a dry ABD pad and secure with tape.  - write the date/time on the dry dressing/tape to better track when the last dressing change occurred. - apply any skin protectant/powder recommended by clinician to protect skin/skin folds. - change dressing as needed if leakage occurs, wound gets contaminated, or patient requests to shower. - patient may shower daily with wound open and following the shower the wound should be dried and a clean dressing placed.   CONTINUE LOW RESIDUE/FIBER DIET FOR 1-2 WEEKS, GRADUALLY TRANSITION BACK TO FOODS HIGHER IN FIBER AFTER THIS.   Low-Fiber Diet Fiber is found in fruits, vegetables, and whole grains. A low-fiber diet restricts fibrous foods that are not digested in the small intestine. A diet containing about 10-15 grams of fiber per day is considered low fiber. Low-fiber diets may be used to:  Promote healing and rest the bowel during intestinal flare-ups.  Prevent blockage of a partially obstructed or narrowed gastrointestinal tract.  Reduce fecal weight and volume.  Slow the movement of feces.  What do I need to know about a low-fiber diet? Always check the fiber content on the packaging's Nutrition Facts label, especially on foods from the grains list. Ask your  dietitian if you have questions about specific foods that are related to your condition, especially if the food is not listed below. In general, a low-fiber food will have less than 2 g of fiber. What foods can I eat? Grains All breads and crackers made with white flour. Sweet rolls, doughnuts, waffles, pancakes, [place] toast, bagels. Pretzels, Melba toast, zwieback. Well-cooked cereals, such as cornmeal, farina, or cream cereals. Dry cereals that do not contain whole grains, fruit, or nuts, such as refined corn, wheat, rice, and oat cereals. Potatoes prepared any way without skins, plain pastas and noodles, refined white rice. Use white flour for baking and making sauces. Use allowed list of grains for casseroles, dumplings, and puddings. Vegetables Strained tomato and vegetable juices. Fresh lettuce, cucumber, spinach. Well-cooked (no skin or pulp) or canned vegetables, such as asparagus, bean sprouts, beets, carrots, green beans, mushrooms, potatoes, pumpkin, spinach, yellow squash, tomato sauce/puree, turnips, yams, and zucchini. Keep servings limited to  cup. Fruits All fruit juices except prune juice. Cooked or canned fruits without skin and seeds, such as applesauce, apricots, cherries, fruit cocktail, grapefruit, grapes, mandarin oranges, melons, peaches, pears, pineapple, and plums. Fresh fruits without skin, such as apricots, avocados, bananas, melons, pineapple, nectarines, and peaches. Keep servings limited to  cup or 1 piece. Meat and Other Protein Sources Ground or well-cooked tender beef, ham, veal, lamb,  pork, or poultry. Eggs, plain cheese. Fish, oysters, shrimp, lobster, and other seafood. Liver, organ meats. Smooth nut butters. Dairy All milk products and alternative dairy substitutes, such as soy, rice, almond, and coconut, not containing added whole nuts, seeds, or added fruit. Beverages Decaf coffee, fruit, and vegetable juices or smoothies (small amounts, with no pulp or skins,  and with fruits from allowed list), sports drinks, herbal tea. Condiments Ketchup, mustard, vinegar, cream sauce, cheese sauce, cocoa powder. Spices in moderation, such as allspice, basil, bay leaves, celery powder or leaves, cinnamon, cumin powder, curry powder, ginger, mace, marjoram, onion or garlic powder, oregano, paprika, parsley flakes, ground pepper, rosemary, sage, savory, tarragon, thyme, and turmeric. Sweets and Desserts Plain cakes and cookies, pie made with allowed fruit, pudding, custard, cream pie. Gelatin, fruit, ice, sherbet, frozen ice pops. Ice cream, ice milk without nuts. Plain hard candy, honey, jelly, molasses, syrup, sugar, chocolate syrup, gumdrops, marshmallows. Limit overall sugar intake. Fats and Oil Margarine, butter, cream, mayonnaise, salad oils, plain salad dressings made from allowed foods. Choose healthy fats such as olive oil, canola oil, and omega-3 fatty acids (such as found in salmon or tuna) when possible. Other Bouillon, broth, or cream soups made from allowed foods. Any strained soup. Casseroles or mixed dishes made with allowed foods. The items listed above may not be a complete list of recommended foods or beverages. Contact your dietitian for more options. What foods are not recommended? Grains All whole wheat and whole grain breads and crackers. Multigrains, rye, bran seeds, nuts, or coconut. Cereals containing whole grains, multigrains, bran, coconut, nuts, raisins. Cooked or dry oatmeal, steel-cut oats. Coarse wheat cereals, granola. Cereals advertised as high fiber. Potato skins. Whole grain pasta, wild or brown rice. Popcorn. Coconut flour. Bran, buckwheat, corn bread, multigrains, rye, wheat germ. Vegetables Fresh, cooked or canned vegetables, such as artichokes, asparagus, beet greens, broccoli, Brussels sprouts, cabbage, celery, cauliflower, corn, eggplant, kale, legumes or beans, okra, peas, and tomatoes. Avoid large servings of any vegetables,  especially raw vegetables. Fruits Fresh fruits, such as apples with or without skin, berries, cherries, figs, grapes, grapefruit, guavas, kiwis, mangoes, oranges, papayas, pears, persimmons, pineapple, and pomegranate. Prune juice and juices with pulp, stewed or dried prunes. Dried fruits, dates, raisins. Fruit seeds or skins. Avoid large servings of all fresh fruits. Meats and Other Protein Sources Tough, fibrous meats with gristle. Chunky nut butter. Cheese made with seeds, nuts, or other foods not recommended. Nuts, seeds, legumes (beans, including baked beans), dried peas, beans, lentils. Dairy Yogurt or cheese that contains nuts, seeds, or added fruit. Beverages Fruit juices with high pulp, prune juice. Caffeinated coffee and teas. Condiments Coconut, maple syrup, pickles, olives. Sweets and Desserts Desserts, cookies, or candies that contain nuts or coconut, chunky peanut butter, dried fruits. Jams, preserves with seeds, marmalade. Large amounts of sugar and sweets. Any other dessert made with fruits from the not recommended list. Other Soups made from vegetables that are not recommended or that contain other foods not recommended. The items listed above may not be a complete list of foods and beverages to avoid. Contact your dietitian for more information. This information is not intended to replace advice given to you by your health care provider. Make sure you discuss any questions you have with your health care provider. Document Released: 11/19/2001 Document Revised: 11/05/2015 Document Reviewed: 04/22/2013 Elsevier Interactive Patient Education  2017 [employer].

## 2017-11-16 NOTE — Evaluation (Signed)
Occupational Therapy Evaluation Patient Details Name: Sonia Hill MRN: [id number] DOB: [birth year] Today's Date: 11/16/2017    History of Present Illness This 62 year old female was admitted with abdomen distention. PMH:  metastatic breast CA, colostomy, arthritis and CKD.  s/p small bowel resection, insertion of g tube, lysis of adhesions, partial colectomy and new colostomy   Clinical Impression   Pt was admitted for the above.  She was independent with adls prior to admission and anticipates d/c very soon.  Per pt, her husband is trying to arrange someone to help her at home while he works.  He bought a safety frame to go around toilet. Pt does get up at night and is currently on lasix.  She would like 3:1 for home.  Will follow in acute setting with supervision to min guard assistance    Follow Up Recommendations  Supervision/Assistance - 24 hour    Equipment Recommendations  3 in 1 bedside commode    Recommendations for Other Services       Precautions / Restrictions Precautions Precautions: Fall Precaution Comments: abd sx Restrictions Weight Bearing Restrictions: No      Mobility Bed Mobility Overal bed mobility: Needs Assistance Bed Mobility: Rolling;Sidelying to Sit;Sit to Sidelying Rolling: Min assist Sidelying to sit: Min assist     Sit to sidelying: Mod assist General bed mobility comments: used chux pad to help pt roll. She used bedrail to help pull up to sitting and bil LEs were assisted back into bed  Transfers Overall transfer level: Needs assistance Equipment used: None Transfers: Sit to/from [facility] Sit to Stand: Min assist Stand pivot transfers: Min assist       General transfer comment: steadying assistance.  Pt held to 3;1 commode when she performed SPT    Balance                                           ADL either performed or assessed with clinical judgement   ADL Overall ADL's : Needs  assistance/impaired             Lower Body Bathing: Moderate assistance;Sit to/from stand       Lower Body Dressing: Maximal assistance;Sit to/from stand   Toilet Transfer: Minimal assistance;Stand-pivot;BSC;RW   Toileting- Clothing Manipulation and Hygiene: Set up;Sitting/lateral lean         General ADL Comments: pt was able to cross legs to don compression stockings prior to admission. She has a h/o lymphedema and reports this is much less now after lasix.  Educated on AE vs having assistance to reduce abdominal stress. She has a Secondary school teacher at home.  Also educated on positioning in a bed. She cannot lie flat due to pulling. She anticipates vac will be removed prior to d/c home.  Pt can complete UB adls with set up. Pt has a tub with grab bar and she typically sidestepped over edge.  This will be preferred method to access tub with someone assisting her.     Vision         Perception     Praxis      Pertinent Vitals/Pain Pain Assessment: Faces Faces Pain Scale: Hurts little more Pain Location: abdomen Pain Descriptors / Indicators: Sore Pain Intervention(s): Limited activity within patient's tolerance;Monitored during session;Repositioned     Hand Dominance     Extremity/Trunk Assessment Upper Extremity Assessment Upper Extremity  Assessment: Overall WFL for tasks assessed(MMT deferred due to abd sx)           Communication Communication Communication: No difficulties   Cognition Arousal/Alertness: Awake/alert Behavior During Therapy: WFL for tasks assessed/performed Overall Cognitive Status: Within Functional Limits for tasks assessed                                     General Comments       Exercises     Shoulder Instructions      Home Living Family/patient expects to be discharged to:: Private residence Living Arrangements: Spouse/significant other Available Help at Discharge: Family               Bathroom Shower/Tub:  Teacher, early years/pre: [place]: Grab bars - tub/shower;Grab bars - toilet          Prior Functioning/Environment Level of Independence: Independent                 OT Problem List: Decreased strength;Decreased activity tolerance;Impaired balance (sitting and/or standing);Pain;Decreased knowledge of use of DME or AE;Decreased knowledge of precautions      OT Treatment/Interventions: Self-care/ADL training;DME and/or AE instruction;Patient/family education;Balance training;Therapeutic activities    OT Goals(Current goals can be found in the care plan section) Acute Rehab OT Goals Patient Stated Goal: return to independence OT Goal Formulation: With patient Time For Goal Achievement: 11/30/17 Potential to Achieve Goals: Good ADL Goals Pt Will Transfer to Toilet: with supervision;stand pivot transfer;bedside commode Additional ADL Goal #1: pt will perform bed mobility at min guard level in preparation for adls/toilet transfers Additional ADL Goal #2: pt will perform LB bathing and donning pants and underwear with min guard and AE  OT Frequency: Min 2X/week   Barriers to D/C:            Co-evaluation              AM-PAC PT "6 Clicks" Daily Activity     Outcome Measure Help from another person eating meals?: None Help from another person taking care of personal grooming?: A Little Help from another person toileting, which includes using toliet, bedpan, or urinal?: A Little Help from another person bathing (including washing, rinsing, drying)?: A Lot Help from another person to put on and taking off regular upper body clothing?: A Little Help from another person to put on and taking off regular lower body clothing?: A Lot 6 Click Score: 17   End of Session    Activity Tolerance: Patient tolerated treatment well Patient left: in bed;with call bell/phone within reach;with bed alarm set;with family/visitor present  OT Visit  Diagnosis: Muscle weakness (generalized) (M62.81)                Time: 0017-4944 OT Time Calculation (min): 41 min Charges:  OT General Charges $OT Visit: 1 Visit OT Evaluation $OT Eval Moderate Complexity: 1 Mod OT Treatments $Self Care/Home Management : 8-22 mins $Therapeutic Activity: 8-22 mins G-Codes:     [place], OTR/L [phone] 11/16/2017  Emilya Justen 11/16/2017, 3:20 PM

## 2017-11-16 NOTE — Progress Notes (Addendum)
[facility] Progress Note  8 Days Post-Op  Subjective: CC:  Pain improving. Eating small amounts of soft foods. Having flatus and BMs. Feels lasix is helping with edema.    Objective: Vital signs in last 24 hours: Temp:  [97.8 F (36.6 C)-98.7 F (37.1 C)] 98.6 F (37 C) (06/06 0625) Pulse Rate:  [114-116] 114 (06/06 0625) Resp:  [14-16] 16 (06/06 0625) BP: (111-125)/(62-83) 125/62 (06/06 0625) SpO2:  [97 %-100 %] 97 % (06/06 0625) Weight:  [55.6 kg (122 lb 9.2 oz)] 55.6 kg (122 lb 9.2 oz) (06/06 0625) Last BM Date: 11/14/17  Intake/Output from previous day: 06/05 0701 - 06/06 0700 In: 2597.9 [P.O.:240; I.V.:2142.9; IV Piggyback:215] Out: 225 [Stool:225] Intake/Output this shift: No intake/output data recorded.  PE: Gen: Alert, NAD, pleasant Pulm: Normal effort, clear to auscultation bilaterally Abd: tight and edematous,appropriately tender, wound VAC midline, G-tube clamped, colostomy pouch in place with gas and liquid stool in pouch, stoma viable. Skin: warm and dry, no rashes  Psych: A&Ox3   Lab Results:  Recent Labs    11/15/17 0445 11/16/17 0416  WBC 9.6 14.2*  HGB 10.5* 10.7*  HCT 29.6* 30.8*  PLT 191 265   BMET Recent Labs    11/15/17 0445 11/16/17 0416  NA 128* 128*  K 3.5 3.5  CL 95* 93*  CO2 29 26  GLUCOSE 109* 99  BUN 25* 26*  CREATININE 0.51 0.58  CALCIUM 8.8* 8.7*   PT/INR No results for input(s): LABPROT, INR in the last 72 hours. CMP     Component Value Date/Time   NA 128 (L) 11/16/2017 0416   NA 134 (L) 12/28/2016 1503   K 3.5 11/16/2017 0416   K 4.0 12/28/2016 1503   CL 93 (L) 11/16/2017 0416   CL 98 12/03/2012 1419   CO2 26 11/16/2017 0416   CO2 25 12/28/2016 1503   GLUCOSE 99 11/16/2017 0416   GLUCOSE 96 12/28/2016 1503   GLUCOSE 89 12/03/2012 1419   BUN 26 (H) 11/16/2017 0416   BUN 12.0 12/28/2016 1503   CREATININE 0.58 11/16/2017 0416   CREATININE 0.81 09/13/2017 1157   CREATININE 0.8 12/28/2016 1503    CALCIUM 8.7 (L) 11/16/2017 0416   CALCIUM 9.7 12/28/2016 1503   PROT 5.1 (L) 11/16/2017 0416   PROT 6.5 12/28/2016 1503   ALBUMIN 2.5 (L) 11/16/2017 0416   ALBUMIN 3.9 12/28/2016 1503   AST 108 (H) 11/16/2017 0416   AST 25 09/13/2017 1157   AST 22 12/28/2016 1503   ALT 108 (H) 11/16/2017 0416   ALT 12 09/13/2017 1157   ALT 20 12/28/2016 1503   ALKPHOS 471 (H) 11/16/2017 0416   ALKPHOS 76 12/28/2016 1503   BILITOT 1.1 11/16/2017 0416   BILITOT 0.6 09/13/2017 1157   BILITOT 0.50 12/28/2016 1503   GFRNONAA >60 11/16/2017 0416   GFRNONAA >60 09/13/2017 1157   GFRAA >60 11/16/2017 0416   GFRAA >60 09/13/2017 1157   Lipase     Component Value Date/Time   LIPASE 37 11/03/2017 1828       Studies/Results: No results found.  Anti-infectives: Anti-infectives (From admission, onward)   Start     Dose/Rate Route Frequency Ordered Stop   11/09/17 0000  cefoTEtan (CEFOTAN) 2 g in sodium chloride 0.9 % 100 mL IVPB     2 g 200 mL/hr over 30 Minutes Intravenous Every 12 hours 11/08/17 1748 11/09/17 0021   11/08/17 1021  sodium chloride 0.9 % with cefoTEtan (CEFOTAN) ADS Med  Note to Pharmacy:  Marchia Meiers   : cabinet override      11/08/17 1021 11/08/17 2229   11/08/17 0600  cefoTEtan (CEFOTAN) 2 g in sodium chloride 0.9 % 100 mL IVPB  Status:  Discontinued    Note to Pharmacy:  Pharmacy may adjust dose strength for optimal dosing.   Send with patient on call to the OR.  Anesthesia to complete antibiotic administration <51min prior to incision per [facility]   2 g 200 mL/hr over 30 Minutes Intravenous On call to O.R. 11/07/17 1234 11/07/17 1433   11/08/17 0600  cefoTEtan in Dextrose 5% (CEFOTAN) IVPB 2 g     2 g Intravenous On call to O.R. 11/07/17 1505 11/08/17 1207   11/07/17 1500  cefoTEtan in Dextrose 5% (CEFOTAN) IVPB 2 g  Status:  Discontinued     2 g Intravenous On call to O.R. 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 -oncologist: Dr. [name]; resumed exemestane, holding abemaciclib until after discharge.   pSBO 2/2 metastatic breast CA w/ known pelvic metastasis S/P diagnostic laparoscopy, LOA, SBR, insertion gastrostomy tube, partial colectomy new colostomy 11/08/17 Dr. Johney Maine - POD#8 -  PO pain control - scheduled tylenol, home gabapentin/xanax. Start po oxycodone PRN. - VACMWFand ostomy appliance being followed by WOC RN -plan tod/c VAC prior to hospital discharge. - having bowel function,G-tube clamped - Soft diet diet   - pepcid for reflux - mobilize/IS  FEN: SOFT DIET + protein shakes TID; continue TNA, pharmacy to start cyclic TNA overnight.  VTE: SCD's, Lovenox ID: perioperative cefotetan; WBC 14.2 today. I have ordered UA and urine Cx; will discuss CT abd with MD Foley: removed 5/31 (POD#2) Follow up: Dr. Johney Maine, Dr. [name]   Plan: PT/OT eval, cycle TNA, PO pain control. Hopefully stable for discharge in 24-48h.  Follow up and D/C instructions provided. Will need HH RN for midline wound care/TNA.    LOS: 13 days    Jill Alexanders , [facility] 11/16/2017, 8:33 AM Pager: [phone] Consults: [phone] Mon-Fri 7:00 am-4:30 pm Sat-Sun 7:00 am-11:30 am

## 2017-11-16 NOTE — Progress Notes (Signed)
PROGRESS NOTE    Sonia Hill  TJQ:[id number] DOB: May 29, 1956 DOA: 11/03/2017 PCP: [name], MD   Brief Narrative:  The patient is a 50 female with metastatic breast cancer who sees Dr. Elza Rafter, Hx of Colostomy + rectal mass 04/2017-complicated by SBO-recurrent SBO 06/2017 and currently on liquid diet at home. She was admitted with severe excruciating abdominal pain no ostomy output. CT Abdomen showed worsening cancer. Underwent Surgery on 5/29 and had Diagnostic Laparoscopy, Lysis of Adhesions, Small Bowel Resection, Insertion of Gastrotomy Tube and Partial Colectomy with new Colostomy. Currently on TPN and diet being advanced to soft Diet and is doing better but WBC started Elevating so will pan-culture.    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    Ascites   New onset left bundle branch block (LBBB)   Metastatic breast cancer to small intestine causing SBO s/p SB resection 11/08/2017   Metastatic breast cancer to colostomy s/p colectomy/colostomy 11/08/2017   Cachexia ([place])   Malnutrition of moderate degree   Anemia  Malignant bowel obstruction/small bowel obstruction secondary to Breast Cancer Metastasis status post small bowel resection 11/08/2017 -Patient noted to have no significant improvement with small bowel obstruction and subsequently underwent laparoscopic diagnostic small bowel resection, insertion of gastrostomy tube, lysis of adhesion, partial colectomy with new colostomy per Dr. Johney Maine 11/08/2017.   -NG tube has been discontinued  per General Surgery.   -IV Robaxin 500 mg q8hhas been added to patient's regimen.   -Patient noted to have some bowel function.   -Tolerating Soft Diet but also will have TNA cycled.  -Pain regimen has been adjusted per General Surgery; Continue current regimen  Hyponatremia -Initially felt likely secondary to hypervolemic hyponatremia plus or minus SIADH.   -Patient noted to have lower extremity edema as well as low albumin levels.   -Patient given IV Lasix however worsening sodium levels down to 123  from 127 from 129; IV Lasix continued today for AM dose and will start po 40 mg po BID tonight  -TSH within normal limits.   -Cortisol level within normal limits.  - Nephrology consulted to assessed the patient and feel hyponatremia may be secondary to hypervolemia with anasarca.   -Patient started on IV albumin and low-dose salt tablets as well as compression stockings but Salt Tablets have been stopped now -Sodium levels improvement in sodium currently at 128.  -Transition to oral Lasix 40 mg daily once tolerating soft Diet but will continue IV Furosemide 80 mg x1 today as well   -Nephrology following and appreciate input and recommendations.   Hypokalemia/hypocalcemia/hypomagnesemia -Continue TPN/TNA  -Electrolytes being repleted per pharmacy.    Hypothyroidism  -TSH 3.123.  Continue IV Synthroid and changed to po in AM  -Once patient has been started on a diet and tolerating will transition to home dose oral Synthroid and likley in AM.    Metastatic estrogen positive lobular breast cancer -Patient with metastatic spread to the spine and rectal area resulting in blockage and diverging colostomy.   -Oncology following and feel as if she is turning the corner -Dr. [name] recommending resuming  Exemestane at this point and continue to hold Abemaciclib   Solitary kidney with Hydronephrosis -Status post Foley catheter placement.   -Renal function stable. Urology consulted and no  further work-up needed during this hospitalization.  Outpatient follow-up.    Anxiety -Ativan as needed.  Severe protein calorie malnutrition -On TPN. -Nutrition is consulted and appreciate further evaluation recommendations -Continue Ensure Enlive 237 mL's p.o. 3 times daily with meals  Volume Overload -Patient with lower extremity edema.   -Likely component of hypoalbuminemia. -Lasix was discontinued due to worsening sodium levels.  Patient was given dose of IV albumin per nephrology on 11/11/2017.   -IV Lasix has been resumed per nephrology and dose increased to 80 mg; Given one dose of IV this AM and and will go to 40 mg po BID tonight -Urine output not accurately recorded.   -Continue to MonitorStrict I's and O's.  Daily weights. -Patient is +19.397 Liters since Admission and Weight is now 122 (up from 108 on Admission)  New left bundle branch block -Noted on EKG the night of 11/08/2017.   -Patient denies any chest pain no shortness of breath.   -2D echo with a EF of 65 to 70%, no wall motion abnormalities, left ventricular diastolic function normal.   -Patient has been seen in consultation by cardiology and no further ischemic work-up recommended at this time.   Singultus/Hiccups, improved -Likely secondary to malignant bowel obstruction.   -Patient on Reglan which has been placed 3 times daily at a lower dose per oncology but now Stopped.  -Patient given a dose of Thorazine however was having's some side effects from it and as such Thorazine has been discontinued. -Patient given Cogentin 1 mg IV every 12 hours x2 doses.   -Clinical improvement with hiccups noted  Normocytic Anemia -Patient is status post 2 units packed red blood cells 11/12/2017. -Patient denies any overt bleeding.   -Colostomy with small brown stool.  FOBT was positive.  Anemia panel has been ordered and looks like patient does have a severe iron deficiency anemia with iron level less than 5.   -IV Feraheme  x1.  LDH, haptoglobin, hepatic panel not consistent with hemolysis.  CT abdomen and pelvis was not done.   -Patient was seen in consultation by Gastroenterology who are recommending serial CBCs.  Transfuse as needed.   -Hb/Hct stable at 10.8/30.6 -Continue to Monitor for S/Sx of Bleeding -Repeat CBC in AM  Leukocytosis -Worsening -WBC went from 9.6 -> 14.2 -> 17.3 -Urinalysis Rechecked along with Urine Culture and Urinalysis Negative  -Last Urine Cx on 6/1 showed 30,000 CFU of Pseudomonas Aeruginosa  -Not on Abx -Repeat Blood Cultures and CXR to be ordered -Check CT Abdomen and Pelvis -Repeat CBC in AM   Abnormal LFT's/Elevated Alk Phos -AST now 108, ALT now 108, and Alk Phos was 471 -Likely from TNA/TPN -Continue to Monitor and Repeat CMP in AM   DVT prophylaxis: SCDs Code Status: FULL CODE Family Communication: Discussed with Husband and Friend at bedside Disposition Plan: Anticipate D/C Home in next 1-2 days if medically stable  Consultants:   General Surgery: [name] 11/04/2017  Palliative care: Dr. Rowe Pavy 10/30/2017  Cardiology: Dr.Skains 11/10/2017  Nephrology: Dr: Jonnie Finner 11/11/2017  Gastroenterology: Dr. Cristina Gong 11/12/2017  Urology: Dr. Alinda Money 11/13/2017  Pharmacy for TNA  Procedures: S/P diagnostic laparoscopy, LOA, SBR, insertion gastrostomy tube, partial colectomy new colostomy 11/08/17 Dr. Johney Maine  Antimicrobials:  Anti-infectives (From admission, onward)   Start     Dose/Rate Route Frequency Ordered Stop  11/09/17 0000  cefoTEtan (CEFOTAN) 2 g in sodium chloride 0.9 % 100 mL IVPB     2 g 200 mL/hr over 30 Minutes Intravenous Every 12 hours 11/08/17 1748 11/09/17 0021   11/08/17 1021  sodium chloride 0.9 % with cefoTEtan (CEFOTAN) ADS Med    Note to Pharmacy:  Marchia Meiers   : cabinet override      11/08/17 1021 11/08/17 2229   11/08/17 0600  cefoTEtan (CEFOTAN) 2 g in sodium chloride 0.9 % 100 mL IVPB  Status:  Discontinued    Note to Pharmacy:  Pharmacy  may adjust dose strength for optimal dosing.   Send with patient on call to the OR.  Anesthesia to complete antibiotic administration [facility]   2 g 200 mL/hr over 30 Minutes Intravenous On call to O.R. 11/07/17 1234 11/07/17 1433   11/08/17 0600  cefoTEtan in Dextrose 5% (CEFOTAN) IVPB 2 g     2 g Intravenous On call to O.R. 11/07/17 1505 11/08/17 1207   11/07/17 1500  cefoTEtan in Dextrose 5% (CEFOTAN) IVPB 2 g  Status:  Discontinued     2 g Intravenous On call to O.R. 11/07/17 1433 11/07/17 1505     Subjective: Seen and examined this morning and had some mild Abdominal Pain. Remains Volume overloaded. No CP or SOB. Wanting to go home. No other concerns or complaints at this time.   Objective: Vitals:   11/15/17 1357 11/15/17 2117 11/16/17 0625 11/16/17 1346  BP: 111/83 113/71 125/62 102/88  Pulse: (!) 116 (!) 114 (!) 114 (!) 116  Resp: _0 Temp: 98.7 F (37.1 C) 97.8 F (36.6 C) 98.6 F (37 C) (!) 97.5 F (36.4 C)  TempSrc: Oral Oral Oral Oral  SpO2: 100% 99% 97% 100%  Weight:   55.6 kg (122 lb 9.2 oz)   Height:        Intake/Output Summary (Last 24 hours) at 11/16/2017 1931 Last data filed at 11/16/2017 1812 Gross per 24 hour  Intake 2417.91 ml  Output 1300 ml  Net 1117.91 ml   Filed Weights   11/14/17 0618 11/15/17 0622 11/16/17 0625  Weight: 54.8 kg (120 lb 13 oz) 55.2 kg (121 lb 11.2 oz) 55.6 kg (122 lb 9.2 oz)   Examination: Physical Exam:  Constitutional: Thin Caucasian female who is currently no acute distress appears calm and slightly anxious Eyes: Conjunctive are normal.  Sclera anicteric ENMT: External ears and nose appear normal.  Grossly normal hearing aid mucous memories are moist Neck: Supple with no JVD Respiratory: Diminished to auscultation bilaterally no appreciable wheezing, rales, rhonchi.  Normal respiratory effort and patient not tachypneic using accessory muscle breathe Cardiovascular: Regular rate and  rhythm no appreciable murmurs, rubs, bowel still has 1-2+ lower extremity edema and abdominal wall edema Abdomen: Soft, slightly tender, distended with edema.  Bowel sounds present x4.  Has a colostomy bag with brown stool; wound VAC in place. GU: Deferred Musculoskeletal: No contractures or cyanosis.  No joint deformities noted Skin: No rashes or lesions on limited skin evaluation.  Wearing compression stockings abdomen has a new colostomy with a wound VAC in place.  Significant lower extremity swelling and some abdominal swelling Neurologic: No nerves II through XII grossly intact no appreciable focal deficits. Psychiatric: Slightly anxious mood and affect.  Intact judgment and insight.  Patient is awake and alert and oriented x3.  Data Reviewed: I have personally reviewed following labs and imaging studies  CBC:  Recent Labs  Lab 11/10/17 0442  11/13/17 0648 11/14/17 1000 11/15/17 0445 11/16/17 0416 11/16/17 1637  WBC 7.7   < > 5.2 8.7 9.6 14.2* 17.3*  NEUTROABS 7.0  --  4.2 7.4  --  12.1* 15.2*  HGB 9.3*   < > 9.9* 11.4* 10.5* 10.7* 10.8*  HCT 27.4*   < > 27.8* 32.3* 29.6* 30.8* 30.6*  MCV 84.0   < > 82.2 82.0 81.3 83.9 81.4  PLT 199   < > 160 164 191 265 290   < > = values in this interval not displayed.   Basic Metabolic Panel: Recent Labs  Lab 11/10/17 0442 11/11/17 0508 11/12/17 0418 11/13/17 0648 11/14/17 0431 11/15/17 0445 11/16/17 0416  NA 127* 123* 125* 130* 129* 128* 128*  K 3.7 4.1 3.9 3.0* 3.6 3.5 3.5  CL 96* 93* 92* 96* 95* 95* 93*  CO2 _0 GLUCOSE 108* 106* 88 99 99 109* 99  BUN 28* 31* 29* 27* 24* 25* 26*  CREATININE 0.89 0.90 0.70 0.64 0.53 0.51 0.58  CALCIUM 8.4* 8.9 9.2 9.4 9.2 8.8* 8.7*  MG 2.1 2.0  --  1.8 2.0 1.9 1.8  PHOS 3.0 2.5  --  3.1  --  3.4 3.6   GFR: Estimated Creatinine Clearance: 57 mL/min (by C-G formula based on SCr of 0.58 mg/dL). Liver Function Tests: Recent Labs  Lab 11/12/17 1353 11/13/17 0648  11/16/17 0416  AST 36 52* 108*  ALT 11* 21 108*  ALKPHOS 79 146* 471*  BILITOT 0.8 1.5* 1.1  PROT 5.2* 5.5* 5.1*  ALBUMIN 3.7 3.8 2.5*   No results for input(s): LIPASE, AMYLASE in the last 168 hours. No results for input(s): AMMONIA in the last 168 hours. Coagulation Profile: No results for input(s): INR, PROTIME in the last 168 hours. Cardiac Enzymes: No results for input(s): CKTOTAL, CKMB, CKMBINDEX, TROPONINI in the last 168 hours. BNP (last 3 results) No results for input(s): PROBNP in the last 8760 hours. HbA1C: No results for input(s): HGBA1C in the last 72 hours. CBG: Recent Labs  Lab 11/12/17 1205 11/12/17 1747 11/12/17 2344 11/13/17 0634 11/13/17 1128  GLUCAP 88 81 105* 91 90   Lipid Profile: No results for input(s): CHOL, HDL, LDLCALC, TRIG, CHOLHDL, LDLDIRECT in the last 72 hours. Thyroid Function Tests: No results for input(s): TSH, T4TOTAL, FREET4, T3FREE, THYROIDAB in the last 72 hours. Anemia Panel: No results for input(s): VITAMINB12, FOLATE, FERRITIN, TIBC, IRON, RETICCTPCT in the last 72 hours. Sepsis Labs: No results for input(s): PROCALCITON, LATICACIDVEN in the last 168 hours.  Recent Results (from the past 240 hour(s))  MRSA PCR Screening     Status: None   Collection Time: 11/07/17  2:43 PM  Result Value Ref Range Status   MRSA by PCR NEGATIVE NEGATIVE Final    Comment:        The GeneXpert MRSA Assay (FDA approved for NASAL specimens only), is one component of a comprehensive MRSA colonization surveillance program. It is not intended to diagnose MRSA infection nor to guide or monitor treatment for MRSA infections. Performed at [facility], [place] [address], [place], [place] [address]   Culture, Urine     Status: Abnormal   Collection Time: 11/08/17  9:54 AM  Result Value Ref Range Status   Specimen Description   Final    URINE, CLEAN CATCH Performed at [facility], [place] [address]  [place], [place] [address]  Special Requests   Final    NONE Performed at [facility], [place] [address], [place], [place] [address]    Culture (A)  Final    <10,000 COLONIES/mL INSIGNIFICANT GROWTH Performed at [place] [address] [place], [place] [address]    Report Status 11/09/2017 FINAL  Final  Culture, Urine     Status: Abnormal   Collection Time: 11/11/17  8:18 AM  Result Value Ref Range Status   Specimen Description   Final    URINE, RANDOM Performed at [place] [address], [name], [place] [address]    Special Requests   Final    NONE Performed at [facility], [place] [address], [place], [place] [address]    Culture 30,000 COLONIES/mL PSEUDOMONAS AERUGINOSA (A)  Final   Report Status 11/13/2017 FINAL  Final   Organism ID, Bacteria PSEUDOMONAS AERUGINOSA (A)  Final      Susceptibility   Pseudomonas aeruginosa - MIC*    CEFTAZIDIME <=1 SENSITIVE Sensitive     CIPROFLOXACIN <=0.25 SENSITIVE Sensitive     GENTAMICIN <=1 SENSITIVE Sensitive     IMIPENEM 1 SENSITIVE Sensitive     PIP/TAZO <=4 SENSITIVE Sensitive     CEFEPIME <=1 SENSITIVE Sensitive     * 30,000 COLONIES/mL PSEUDOMONAS AERUGINOSA    Radiology Studies: No results found.  Scheduled Meds: . acetaminophen  1,000 mg Oral Q8H  . exemestane  25 mg Oral QPC breakfast  . famotidine  20 mg Oral BID  . feeding supplement (ENSURE ENLIVE)  237 mL Oral TID WC  . furosemide  40 mg Oral BID  . gabapentin  300 mg Oral TID  . levothyroxine  50 mcg Intravenous Daily  . lip balm  1 application Topical BID  . LORazepam  0.5 mg Intravenous Q24H  . methocarbamol  500 mg Oral Q8H   Continuous Infusions: . Marland KitchenTPN (CLINIMIX-E) Adult 106 mL/hr at 11/16/17 1915   And  . Fat emulsion 240 mL (11/16/17 1812)    LOS: 13 days   [name], DO [facility] Pager [phone]  If 7PM-7AM, please contact  night-coverage [email or web address] Password [facility] 11/16/2017, 7:31 PM

## 2017-11-16 NOTE — Progress Notes (Signed)
[place] NOTE   Pharmacy Consult for TPN Indication: Intolerance to enteral feeding  Patient Measurements: Height: 5' (152.4 cm) Weight: 122 lb 9.2 oz (55.6 kg) IBW/kg (Calculated) : 45.5 TPN AdjBW (KG): 49 Body mass index is 23.94 kg/m.   Insulin Requirements: none, DC SSI/CBGs 6/3  Current Nutrition: GT clamped 6/3, FLD ordered, no intake recorded in EPIC  IVF: none ordered  Central access: PICC 5/25 TPN start date: 5/28  ASSESSMENT                                                                                                          HPI:  Patient has metastatic breast cancer and partial small bowel obstruction on full liquids for 4 months, now intolerance to clear liquids.  Significant events:  5/29 surgery 5/31 advanced to CLD but did not tolerate 6/3 GT clamped, FLD ordered 6/5 advanced to soft diet 6/6 request to cycle TPN as pt to go home with TPN  Today:   Glucose - at goal of <150  Electrolytes -Na 128 (see nephrologist note-  IV albumin & salt tabs d/cd 6/3, IV lasix)  K 3.5, mag 1.8, phos 3.6  Renal - WNL  LFTs - AST,ALT and Alk phos elevated  TGs - 152, 157 ( 6/3)  Prealbumin - 10.7 ( 5/29);  9.6 (6/5)   NUTRITIONAL GOALS                                                                                             RD recs: 5/30 1700-1800 Kcal and 85-95 gm protein Clinimix E 5/15 at a goal rate of 75 ml/hr + 20% fat emulsion at 56m/hr to provide: 90 g/day protein, 1758 Kcal/day.  PLAN                                                                                                         At 1800 today:  Begin cycling Clinimix E 5/15 over 18 hours: 50 ml x 1 hour then 106 ml x 16 hours then 583mx 1 hour then stop  20% fat emulsion at 2020mr x 12hours  TPN to contain standard multivitamins and trace elements.  BMet with mag and phos in am  TPN  lab panels on Mondays & Thursdays.  F/u  daily.  Dolly Rias RPh 11/16/2017, 11:49 AM Pager [phone]

## 2017-11-16 NOTE — Progress Notes (Signed)
OT Cancellation Note  Patient Details Name: Sonia Hill MRN: [id number] DOB: [birth year]   Cancelled Treatment:    Reason Eval/Treat Not Completed: Other (comment).  Pt is busy with nursing.  Will try to return later this afternoon; if unable, I will return tomorrow.  Sonia Hill 11/16/2017, 1:38 PM  Lesle Chris, OTR/L [phone] 11/16/2017

## 2017-11-17 ENCOUNTER — Inpatient Hospital Stay (HOSPITAL_COMMUNITY): Payer: 59

## 2017-11-17 DIAGNOSIS — E46 Unspecified protein-calorie malnutrition: Secondary | ICD-10-CM

## 2017-11-17 DIAGNOSIS — J9811 Atelectasis: Secondary | ICD-10-CM

## 2017-11-17 DIAGNOSIS — M7989 Other specified soft tissue disorders: Secondary | ICD-10-CM

## 2017-11-17 DIAGNOSIS — Z9012 Acquired absence of left breast and nipple: Secondary | ICD-10-CM

## 2017-11-17 LAB — HEPATIC FUNCTION PANEL
ALT: 53 U/L (ref 14–54)
AST: 37 U/L (ref 15–41)
Albumin: 2.1 g/dL — ABNORMAL LOW (ref 3.5–5.0)
Alkaline Phosphatase: 363 U/L — ABNORMAL HIGH (ref 38–126)
BILIRUBIN TOTAL: 1 mg/dL (ref 0.3–1.2)
Bilirubin, Direct: 0.5 mg/dL (ref 0.1–0.5)
Indirect Bilirubin: 0.5 mg/dL (ref 0.3–0.9)
Total Protein: 4.7 g/dL — ABNORMAL LOW (ref 6.5–8.1)

## 2017-11-17 LAB — CBC WITH DIFFERENTIAL/PLATELET
Basophils Absolute: 0 10*3/uL (ref 0.0–0.1)
Basophils Relative: 0 %
EOS ABS: 0.1 10*3/uL (ref 0.0–0.7)
Eosinophils Relative: 0 %
HCT: 28.5 % — ABNORMAL LOW (ref 36.0–46.0)
HEMOGLOBIN: 10 g/dL — AB (ref 12.0–15.0)
LYMPHS ABS: 1.2 10*3/uL (ref 0.7–4.0)
Lymphocytes Relative: 6 %
MCH: 29.4 pg (ref 26.0–34.0)
MCHC: 35.1 g/dL (ref 30.0–36.0)
MCV: 83.8 fL (ref 78.0–100.0)
Monocytes Absolute: 0.6 10*3/uL (ref 0.1–1.0)
Monocytes Relative: 3 %
NEUTROS PCT: 91 %
Neutro Abs: 17.2 10*3/uL — ABNORMAL HIGH (ref 1.7–7.7)
Platelets: 307 10*3/uL (ref 150–400)
RBC: 3.4 MIL/uL — AB (ref 3.87–5.11)
RDW: 14.8 % (ref 11.5–15.5)
WBC: 19.1 10*3/uL — AB (ref 4.0–10.5)

## 2017-11-17 LAB — BODY FLUID CELL COUNT WITH DIFFERENTIAL
Eos, Fluid: 0 %
Lymphs, Fluid: 2 %
Monocyte-Macrophage-Serous Fluid: 16 % — ABNORMAL LOW (ref 50–90)
Neutrophil Count, Fluid: 82 % — ABNORMAL HIGH (ref 0–25)
Total Nucleated Cell Count, Fluid: 2837 cu mm — ABNORMAL HIGH (ref 0–1000)

## 2017-11-17 LAB — GRAM STAIN

## 2017-11-17 LAB — LACTATE DEHYDROGENASE, PLEURAL OR PERITONEAL FLUID: LD FL: 426 U/L — AB (ref 3–23)

## 2017-11-17 LAB — URINE CULTURE

## 2017-11-17 LAB — BASIC METABOLIC PANEL
Anion gap: 10 (ref 5–15)
BUN: 26 mg/dL — AB (ref 6–20)
CO2: 25 mmol/L (ref 22–32)
CREATININE: 0.52 mg/dL (ref 0.44–1.00)
Calcium: 8.1 mg/dL — ABNORMAL LOW (ref 8.9–10.3)
Chloride: 90 mmol/L — ABNORMAL LOW (ref 101–111)
GFR calc Af Amer: >60 mL/min (ref >60)
Glucose, Bld: 100 mg/dL — ABNORMAL HIGH (ref 65–99)
Potassium: 3.5 mmol/L (ref 3.5–5.1)
SODIUM: 125 mmol/L — AB (ref 135–145)

## 2017-11-17 LAB — GLUCOSE, PLEURAL OR PERITONEAL FLUID: GLUCOSE FL: 56 mg/dL

## 2017-11-17 LAB — PHOSPHORUS: Phosphorus: 3.6 mg/dL (ref 2.5–4.6)

## 2017-11-17 LAB — MAGNESIUM: MAGNESIUM: 1.7 mg/dL (ref 1.7–2.4)

## 2017-11-17 MED ORDER — LEVOTHYROXINE SODIUM 100 MCG PO TABS
100.0000 ug | ORAL_TABLET | Freq: Every day | ORAL | Status: DC
  Administered 2017-11-18 – 2017-11-24 (×7): 100 ug via ORAL
  Filled 2017-11-17 (×7): qty 1

## 2017-11-17 MED ORDER — CLINIMIX E/DEXTROSE (5/15) 5 % IV SOLN
INTRAVENOUS | Status: AC
  Administered 2017-11-17: 18:00:00 via INTRAVENOUS
  Filled 2017-11-17: qty 996

## 2017-11-17 MED ORDER — PRO-STAT SUGAR FREE PO LIQD
30.0000 mL | Freq: Two times a day (BID) | ORAL | Status: DC
  Administered 2017-11-17 – 2017-11-24 (×13): 30 mL via ORAL
  Filled 2017-11-17 (×14): qty 30

## 2017-11-17 MED ORDER — ALBUMIN HUMAN 25 % IV SOLN
25.0000 g | Freq: Two times a day (BID) | INTRAVENOUS | Status: DC
  Administered 2017-11-17 – 2017-11-19 (×6): 25 g via INTRAVENOUS
  Filled 2017-11-17 (×7): qty 100

## 2017-11-17 MED ORDER — ADULT MULTIVITAMIN W/MINERALS CH
1.0000 | ORAL_TABLET | Freq: Every day | ORAL | Status: DC
  Administered 2017-11-18 – 2017-11-24 (×7): 1 via ORAL
  Filled 2017-11-17 (×7): qty 1

## 2017-11-17 MED ORDER — FUROSEMIDE 10 MG/ML IJ SOLN
40.0000 mg | Freq: Once | INTRAMUSCULAR | Status: AC
  Administered 2017-11-17: 40 mg via INTRAVENOUS
  Filled 2017-11-17: qty 4

## 2017-11-17 MED ORDER — PIPERACILLIN-TAZOBACTAM 3.375 G IVPB
3.3750 g | Freq: Three times a day (TID) | INTRAVENOUS | Status: DC
  Administered 2017-11-17 – 2017-11-24 (×21): 3.375 g via INTRAVENOUS
  Filled 2017-11-17 (×21): qty 50

## 2017-11-17 MED ORDER — MAGNESIUM SULFATE 2 GM/50ML IV SOLN
2.0000 g | Freq: Once | INTRAVENOUS | Status: AC
  Administered 2017-11-17: 2 g via INTRAVENOUS
  Filled 2017-11-17: qty 50

## 2017-11-17 MED ORDER — LIDOCAINE HCL 1 % IJ SOLN
INTRAMUSCULAR | Status: AC
  Filled 2017-11-17: qty 20

## 2017-11-17 MED ORDER — FAT EMULSION PLANT BASED 20 % IV EMUL
240.0000 mL | INTRAVENOUS | Status: AC
  Administered 2017-11-17: 240 mL via INTRAVENOUS
  Filled 2017-11-17: qty 250

## 2017-11-17 MED ORDER — FUROSEMIDE 10 MG/ML IJ SOLN
80.0000 mg | Freq: Two times a day (BID) | INTRAMUSCULAR | Status: DC
  Administered 2017-11-17 – 2017-11-24 (×14): 80 mg via INTRAVENOUS
  Filled 2017-11-17 (×15): qty 8

## 2017-11-17 NOTE — Progress Notes (Signed)
When pt is medically ready for discharge. She will discharge with [facility].

## 2017-11-17 NOTE — Progress Notes (Signed)
Calorie Count Note  48 hour calorie count ordered.  Diet: Soft + cyclic TPN with 03% ile @ 20 mL/hr x12 hours.  Supplements: Ensure Enlive TID; Orgain supplements from home  Nutrition Diagnosis: Moderate Malnutrition related to cancer and cancer related treatments, chronic illness as evidenced by moderate fat depletion, moderate muscle depletion.  Goal: Patient will meet greater than or equal to 90% of her needs.  Intervention:  - Husband bringing Orgain protein supplements from home. - Encourage PO intakes.  - TPN per Pharmacy.     Jarome Matin, MS, RD, LDN, [facility] Inpatient Clinical Dietitian Pager # [phone] After hours/weekend pager # [phone]

## 2017-11-17 NOTE — Progress Notes (Signed)
Occupational Therapy Treatment Patient Details Name: Sonia Hill MRN: [id number] DOB: [birth year] Today's Date: 11/17/2017    History of present illness 62 year old female was admitted with abdomen distention. PMH:  metastatic breast CA, colostomy, arthritis and CKD.  s/p small bowel resection, insertion of g tube, lysis of adhesions, partial colectomy and new colostomy   OT comments  Worked with reacher for LB dressing, bed mobility and toilet transfer  Follow Up Recommendations  Supervision/Assistance - 24 hour    Equipment Recommendations  3 in 1 bedside commode    Recommendations for Other Services      Precautions / Restrictions Precautions Precautions: Fall Precaution Comments: L colostomy, G tube Restrictions Weight Bearing Restrictions: No       Mobility Bed Mobility Overal bed mobility: Needs Assistance Bed Mobility: Rolling;Sidelying to Sit;Sit to Sidelying Rolling: Min guard(side to back) Sidelying to sit: Min assist     Sit to sidelying: Mod assist General bed mobility comments: assistance for legs when lying back  Transfers Overall transfer level: Needs assistance Equipment used: Rolling walker (2 wheeled) Transfers: Sit to/from Stand Sit to Stand: Supervision Stand pivot transfers: Supervision       General transfer comment: pt steady with sit to stand and SPT.  cues for lines    Balance Overall balance assessment: Needs assistance           Standing balance-Leahy Scale: Fair                             ADL either performed or assessed with clinical judgement   ADL                       Lower Body Dressing: Minimal assistance;Sit to/from stand(pants with RW)   Toilet Transfer: Supervision/safety;Stand-pivot;Regular Toilet;RW   Toileting- Clothing Manipulation and Hygiene: Set up;Supervision/safety;Sit to/from stand         General ADL Comments: pt used reacher to don pants/leggings. They are too tight for her  to wear at this time. She has a Secondary school teacher, and verbalizes how to use it for LB dressing. Pt may wear gown/robe home for comfort.  She emptied colostomy after using toilet     Vision       Perception     Praxis      Cognition Arousal/Alertness: Awake/alert Behavior During Therapy: WFL for tasks assessed/performed Overall Cognitive Status: Within Functional Limits for tasks assessed:  Cues for safety with lines                                          Exercises     Shoulder Instructions       General Comments      Pertinent Vitals/ Pain       Pain Assessment: Faces Faces Pain Scale: Hurts a little bit Pain Location: abdomen Pain Descriptors / Indicators: Sore Pain Intervention(s): Limited activity within patient's tolerance;Monitored during session;Repositioned  Home Living Family/patient expects to be discharged to:: Private residence Living Arrangements: Spouse/significant other Available Help at Discharge: Family   Home Access: Stairs to enter Technical brewer of Steps: 6   Home Layout: Able to live on main level with bedroom/bathroom               Home Equipment: None          Prior  Functioning/Environment Level of Independence: Independent            Frequency  Min 2X/week        Progress Toward Goals  OT Goals(current goals can now be found in the care plan section)  Progress towards OT goals: Progressing toward goals  Acute Rehab OT Goals Patient Stated Goal: return to independence  Plan      Co-evaluation                 AM-PAC PT "6 Clicks" Daily Activity     Outcome Measure   Help from another person eating meals?: None Help from another person taking care of personal grooming?: A Little Help from another person toileting, which includes using toliet, bedpan, or urinal?: A Little Help from another person bathing (including washing, rinsing, drying)?: A Lot Help from another person to put on and  taking off regular upper body clothing?: A Little Help from another person to put on and taking off regular lower body clothing?: A Lot 6 Click Score: 17    End of Session    OT Visit Diagnosis: Muscle weakness (generalized) (M62.81)   Activity Tolerance Patient tolerated treatment well   Patient Left in bed;with call bell/phone within reach;with bed alarm set   Nurse Communication          Time: 7619-5093 OT Time Calculation (min): 32 min  Charges: OT General Charges $OT Visit: 1 Visit OT Treatments $Self Care/Home Management : 23-37 mins  Lesle Chris, OTR/L [phone] 11/17/2017   [place] 11/17/2017, 2:59 PM

## 2017-11-17 NOTE — Progress Notes (Addendum)
[facility] Progress Note  9 Days Post-Op  Subjective CC:  Pain controlled. Tolerating protein shakes and small amts SOFT foods without N/V. Having colostomy output. Urinating frequently. Does endorse shallow breathing but denies fever/chills/productive cough. Has been afraid to cough/expectorate sputum. Excited about the idea of going home soon but nervous about mobilizing at home, especially when her husband goes back to work next week.   Objective: Vital signs in last 24 hours: Temp:  [97.5 F (36.4 C)-98.1 F (36.7 C)] 98 F (36.7 C) (06/07 0620) Pulse Rate:  [115-120] 120 (06/07 0620) Resp:  [17-18] 18 (06/07 0620) BP: (96-115)/(66-88) 96/71 (06/07 0620) SpO2:  [98 %-100 %] 98 % (06/07 0620) Last BM Date: 11/17/17  Intake/Output from previous day: 06/06 0701 - 06/07 0700 In: 1175 [P.O.:240; I.V.:935] Out: 1100 [Urine:1050; Stool:50] Intake/Output this shift: No intake/output data recorded.  PE: Gen: Alert, NAD, pleasant CV: RRR, pitting edema BLE  Pulm: Normal effort, clear to auscultation bilaterally Abd: soft, edematous,appropriately tender,wound VAC removed revealing a beefy red midline wound - wet to dry placed with WOC RN, G-tube clamped, colostomy pouch in place with gas and liquid stoolin pouch, stoma viable. Skin: warm and dry, no rashes  Psych: A&Ox3  Lab Results:  Recent Labs    11/16/17 0416 11/16/17 1637  WBC 14.2* 17.3*  HGB 10.7* 10.8*  HCT 30.8* 30.6*  PLT 265 290   BMET Recent Labs    11/16/17 0416 11/17/17 0352  NA 128* 125*  K 3.5 3.5  CL 93* 90*  CO2 26 25  GLUCOSE 99 100*  BUN 26* 26*  CREATININE 0.58 0.52  CALCIUM 8.7* 8.1*   PT/INR No results for input(s): LABPROT, INR in the last 72 hours. CMP     Component Value Date/Time   NA 125 (L) 11/17/2017 0352   NA 134 (L) 12/28/2016 1503   K 3.5 11/17/2017 0352   K 4.0 12/28/2016 1503   CL 90 (L) 11/17/2017 0352   CL 98 12/03/2012 1419   CO2 25 11/17/2017  0352   CO2 25 12/28/2016 1503   GLUCOSE 100 (H) 11/17/2017 0352   GLUCOSE 96 12/28/2016 1503   GLUCOSE 89 12/03/2012 1419   BUN 26 (H) 11/17/2017 0352   BUN 12.0 12/28/2016 1503   CREATININE 0.52 11/17/2017 0352   CREATININE 0.81 09/13/2017 1157   CREATININE 0.8 12/28/2016 1503   CALCIUM 8.1 (L) 11/17/2017 0352   CALCIUM 9.7 12/28/2016 1503   PROT 5.1 (L) 11/16/2017 0416   PROT 6.5 12/28/2016 1503   ALBUMIN 2.5 (L) 11/16/2017 0416   ALBUMIN 3.9 12/28/2016 1503   AST 108 (H) 11/16/2017 0416   AST 25 09/13/2017 1157   AST 22 12/28/2016 1503   ALT 108 (H) 11/16/2017 0416   ALT 12 09/13/2017 1157   ALT 20 12/28/2016 1503   ALKPHOS 471 (H) 11/16/2017 0416   ALKPHOS 76 12/28/2016 1503   BILITOT 1.1 11/16/2017 0416   BILITOT 0.6 09/13/2017 1157   BILITOT 0.50 12/28/2016 1503   GFRNONAA >60 11/17/2017 0352   GFRNONAA >60 09/13/2017 1157   GFRAA >60 11/17/2017 0352   GFRAA >60 09/13/2017 1157   Lipase     Component Value Date/Time   LIPASE 37 11/03/2017 1828       Studies/Results: Ct Abdomen Pelvis W Contrast  Result Date: 11/17/2017 CLINICAL DATA:  Leukocytosis. Recent partial colectomy, small-bowel resection and colostomy. EXAM: CT ABDOMEN AND PELVIS WITH CONTRAST TECHNIQUE: Multidetector CT imaging of the abdomen and pelvis was performed  using the standard protocol following bolus administration of intravenous contrast. CONTRAST:  138mL ISOVUE-300 IOPAMIDOL (ISOVUE-300) INJECTION 61% COMPARISON:  Preoperative noncontrast abdominal CT 11/03/2017 FINDINGS: Lower chest: Moderate bilateral pleural effusions and adjacent airspace disease, on the right this represents compressive atelectasis, on the left may be atelectasis or pneumonia. The heart is normal in size. Prior left mastectomy. Hepatobiliary: No focal hepatic lesion. Gallbladder partially distended, no calcified gallstone. No biliary dilatation. Pancreas: No ductal dilatation or inflammation. Spleen: Normal in size without  focal abnormality. Adrenals/Urinary Tract: No adrenal nodule. Prior left nephrectomy. Moderate right hydronephrosis and proximal hydroureter, not significantly changed from prior exams. More distal ureter is nondistended. Urinary bladder is partially distended. Posterior bladder wall thickening more prominent on the left. Stomach/Bowel: Gastrostomy tube in the stomach. Prior small bowel dilatation has resolved. Enteric sutures noted in small bowel in the lower pelvis. No bowel obstruction with enteric contrast reaching the transverse colon and left mid abdominal colostomy. Stabled off transverse and descending colon are decompressed. Irregular enhancing rectal mass extending into the perirectal soft tissues and uterus, direct comparison difficult given lack of enhancement on prior exam. No peristomal hernia. Vascular/Lymphatic: Abdominal aorta is normal in caliber. Portal, splenic, and mesenteric veins are patent. No acute vascular findings. No definite enlarged abdominal or pelvic lymph nodes. Small retroperitoneal nodes are similar to prior exam. Reproductive: Rectal mass involves the uterus which is heterogeneous, enlarged, with ill-defined borders inferiorly. Other: Moderate volume intra-abdominal ascites which measures simple fluid density, however slight peritoneal enhancement is noted. Loculated pelvic fluid appears similar to prior exams. Generalized body wall edema and skin thickening. No free air in the abdomen. Midline skin staples noted. Musculoskeletal: Possible vague lucent lesion within L1 and L3 vertebral bodies, not definitively seen on prior exams. No sclerotic lesions. IMPRESSION: 1. Recent small bowel resection and transverse colostomy. Moderate volume of intra-abdominal ascites with slight peritoneal enhancement, but no well-defined abscess. No free air. 2. Moderate bilateral pleural effusions and bibasilar airspace disease. On the right this is consistent with compressive atelectasis, on the  left may be atelectasis or pneumonia in the setting of leukocytosis. Whole body wall edema and anasarca. 3. Enhancing rectal mass invading the uterus and possibly posterior wall of the bladder. 4. Stable right hydronephrosis and proximal hydroureter. 5. Possible lucent lesions within L1 and L3 vertebral bodies out or not seen on 07/10/2017 CT, and may represent osseous metastatic disease. This could be further assessed with nonemergent bone scan or PET-CT. Electronically Signed   By: Jeb Levering M.D.   On: 11/17/2017 00:19   Dg Chest Port 1 View  Result Date: 11/16/2017 CLINICAL DATA:  Leukocytosis. EXAM: PORTABLE CHEST 1 VIEW COMPARISON:  Chest radiograph 11/11/2004, lung bases from abdominal CT 11/03/2017 FINDINGS: Left pleural effusion and basilar opacity, unchanged from prior exam. Small right pleural effusion has improved from prior exam. Mild residual right basilar atelectasis. Heart is normal in size. No new airspace disease. Tip of the right upper extremity PICC in the SVC. Left mastectomy with surgical clips in the chest wall. IMPRESSION: Left pleural effusion and basilar opacity are unchanged from chest radiograph 5 days ago. Basilar opacity likely secondary to atelectasis, sterility technically indeterminate by radiograph. Improved right pleural effusion and atelectasis from prior exam. No new airspace disease. Electronically Signed   By: Jeb Levering M.D.   On: 11/16/2017 20:42    Anti-infectives: Anti-infectives (From admission, onward)   Start     Dose/Rate Route Frequency Ordered Stop   11/09/17 0000  cefoTEtan (CEFOTAN) 2 g in sodium chloride 0.9 % 100 mL IVPB     2 g 200 mL/hr over 30 Minutes Intravenous Every 12 hours 11/08/17 1748 11/09/17 0021   11/08/17 1021  sodium chloride 0.9 % with cefoTEtan (CEFOTAN) ADS Med    Note to Pharmacy:  Marchia Meiers   : cabinet override      11/08/17 1021 11/08/17 2229   11/08/17 0600  cefoTEtan (CEFOTAN) 2 g in sodium chloride 0.9 % 100  mL IVPB  Status:  Discontinued    Note to Pharmacy:  Pharmacy may adjust dose strength for optimal dosing.   Send with patient on call to the OR.  Anesthesia to complete antibiotic administration <38min prior to incision per [facility].   2 g 200 mL/hr over 30 Minutes Intravenous On call to O.R. 11/07/17 1234 11/07/17 1433   11/08/17 0600  cefoTEtan in Dextrose 5% (CEFOTAN) IVPB 2 g     2 g Intravenous On call to O.R. 11/07/17 1505 11/08/17 1207   11/07/17 1500  cefoTEtan in Dextrose 5% (CEFOTAN) IVPB 2 g  Status:  Discontinued     2 g Intravenous On call to O.R. 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 -oncologist: Dr. [name]; resumed exemestane, holding abemaciclib until after discharge.   pSBO 2/2 metastatic breast CA w/ known pelvic metastasis S/P diagnostic laparoscopy, LOA, SBR, insertion gastrostomy tube, partial colectomy new colostomy 11/08/17 Dr. Johney Maine - POD#9 - PO pain control - scheduled tylenol,home gabapentin/xanax, po oxycodone PRN. - VAC removed; daily wet-to-dry dressing changes to midline wound - having bowel function,G-tube clamped -Soft diet +TNA  - pepcid for reflux - mobilize/IS; PT eval today   FEN: SOFT DIET + protein shakes TID; continue cyclic TNA; hyponatremia 125 VTE: SCD's, Lovenox ID: perioperative cefotetan; WBC 17.3 on 6/6- afebrile, UA negative, CT abd/pelvis not impressive, atelectasis w/ ? LLL PNA but CXR looks stable compared to 5 d ago, blood CX pending Foley: removed 5/31 (POD#2) Follow up: Dr. Johney Maine, Dr. [name]   Plan: Ongoing leukocytosis workup per  medical service. Abdominal exam is benign. Stable for discharge from a surgical perspective w/ [facility] RN for wound care and TNA, follow up with Dr. Johney Maine in 2 weeks.     LOS: 14 days    Jill Alexanders , [facility] 11/17/2017, 8:21 AM Pager: [phone] Consults: [phone] Mon-Fri 7:00 am-4:30 pm Sat-Sun 7:00 am-11:30 am

## 2017-11-17 NOTE — Progress Notes (Signed)
Sonia Hill   DOB:10/10/1955   FO#:[id number]   MVE#:[id number]  Subjective:  Sonia Hill is considerably improved, taking soft diet so far w/o complications, tolerating TNA, ambulating in halls at least 2x/day. Concerned re "fluid in legs." Husband in room  Objective: middle aged White woman sitting at bedside Vitals:   11/16/17 2117 11/17/17 0620  BP: 115/66 96/71  Pulse: (!) 115 (!) 120  Resp: 18 18  Temp: 98.1 F (36.7 C) 98 F (36.7 C)  SpO2: 100% 98%    Body mass index is 23.94 kg/m.  Intake/Output Summary (Last 24 hours) at 11/17/2017 0753 Last data filed at 11/16/2017 1812 Gross per 24 hour  Intake 1175 ml  Output 1100 ml  Net 75 ml   CBG (last 3)  No results for input(s): GLUCAP in the last 72 hours.   Labs:  Lab Results  Component Value Date   WBC 17.3 (H) 11/16/2017   HGB 10.8 (L) 11/16/2017   HCT 30.6 (L) 11/16/2017   MCV 81.4 11/16/2017   PLT 290 11/16/2017   NEUTROABS 15.2 (H) 11/16/2017    _0 @  Urine Studies No results for input(s): UHGB, CRYS in the last 72 hours.  Invalid input(s): UACOL, UAPR, USPG, UPH, UTP, UGL, UKET, UBIL, UNIT, UROB, ULEU, UEPI, UWBC, [place], [place], [place], [place], [place]  Basic Metabolic Panel: Recent Labs  Lab 11/11/17 [phone]  11/13/17 6294 11/14/17 0431 11/15/17 0445 11/16/17 0416 11/17/17 0352  NA 123*   < > 130* 129* 128* 128* 125*  K 4.1   < > 3.0* 3.6 3.5 3.5 3.5  CL 93*   < > 96* 95* 95* 93* 90*  CO2 24   < > _1 GLUCOSE 106*   < > 99 99 109* 99 100*  BUN 31*   < > 27* 24* 25* 26* 26*  CREATININE 0.90   < > 0.64 0.53 0.51 0.58 0.52  CALCIUM 8.9   < > 9.4 9.2 8.8* 8.7* 8.1*  MG 2.0  --  1.8 2.0 1.9 1.8 1.7  PHOS 2.5  --  3.1  --  3.4 3.6 3.6   < > = values in this interval not displayed.   GFR Estimated Creatinine Clearance: 57 mL/min (by C-G formula based on SCr of 0.52 mg/dL). Liver Function Tests: Recent Labs  Lab 11/12/17 1353 11/13/17 0648 11/16/17 0416  AST 36 52* 108*  ALT 11* 21 108*   ALKPHOS 79 146* 471*  BILITOT 0.8 1.5* 1.1  PROT 5.2* 5.5* 5.1*  ALBUMIN 3.7 3.8 2.5*   No results for input(s): LIPASE, AMYLASE in the last 168 hours. No results for input(s): AMMONIA in the last 168 hours. Coagulation profile No results for input(s): INR, PROTIME in the last 168 hours.  CBC: Recent Labs  Lab 11/13/17 0648 11/14/17 1000 11/15/17 0445 11/16/17 0416 11/16/17 1637  WBC 5.2 8.7 9.6 14.2* 17.3*  NEUTROABS 4.2 7.4  --  12.1* 15.2*  HGB 9.9* 11.4* 10.5* 10.7* 10.8*  HCT 27.8* 32.3* 29.6* 30.8* 30.6*  MCV 82.2 82.0 81.3 83.9 81.4  PLT 160 164 191 265 290   Cardiac Enzymes: No results for input(s): CKTOTAL, CKMB, CKMBINDEX, TROPONINI in the last 168 hours. BNP: Invalid input(s): POCBNP CBG: Recent Labs  Lab 11/12/17 1205 11/12/17 1747 11/12/17 2344 11/13/17 0634 11/13/17 1128  GLUCAP 88 81 105* 91 90   D-Dimer No results for input(s): DDIMER in the last 72 hours. Hgb A1c No results for input(s): HGBA1C in the last  72 hours. Lipid Profile No results for input(s): CHOL, HDL, LDLCALC, TRIG, CHOLHDL, LDLDIRECT in the last 72 hours. Thyroid function studies No results for input(s): TSH, T4TOTAL, T3FREE, THYROIDAB in the last 72 hours.  Invalid input(s): FREET3 Anemia work up No results for input(s): VITAMINB12, FOLATE, FERRITIN, TIBC, IRON, RETICCTPCT in the last 72 hours. Microbiology Recent Results (from the past 240 hour(s))  MRSA PCR Screening     Status: None   Collection Time: 11/07/17  2:43 PM  Result Value Ref Range Status   MRSA by PCR NEGATIVE NEGATIVE Final    Comment:        The GeneXpert MRSA Assay (FDA approved for NASAL specimens only), is one component of a comprehensive MRSA colonization surveillance program. It is not intended to diagnose MRSA infection nor to guide or monitor treatment for MRSA infections. Performed at [facility], [place] [address] [place], [place] [address]   Culture, Urine     Status:  Abnormal   Collection Time: 11/08/17  9:54 AM  Result Value Ref Range Status   Specimen Description   Final    URINE, CLEAN CATCH Performed at [facility], [place] [address] [place], [place] [address]    Special Requests   Final    NONE Performed at [facility], [place] [address], [place], [place] [address]    Culture (A)  Final    <10,000 COLONIES/mL INSIGNIFICANT GROWTH Performed at [place] [address] [place], [place] [address]    Report Status 11/09/2017 FINAL  Final  Culture, Urine     Status: Abnormal   Collection Time: 11/11/17  8:18 AM  Result Value Ref Range Status   Specimen Description   Final    URINE, RANDOM Performed at [place] [address] [place], [place] [address]    Special Requests   Final    NONE Performed at [facility], [place] [address], [place], [place] [address]    Culture 30,000 COLONIES/mL PSEUDOMONAS AERUGINOSA (A)  Final   Report Status 11/13/2017 FINAL  Final   Organism ID, Bacteria PSEUDOMONAS AERUGINOSA (A)  Final      Susceptibility   Pseudomonas aeruginosa - MIC*    CEFTAZIDIME <=1 SENSITIVE Sensitive     CIPROFLOXACIN <=0.25 SENSITIVE Sensitive     GENTAMICIN <=1 SENSITIVE Sensitive     IMIPENEM 1 SENSITIVE Sensitive     PIP/TAZO <=4 SENSITIVE Sensitive     CEFEPIME <=1 SENSITIVE Sensitive     * 30,000 COLONIES/mL PSEUDOMONAS AERUGINOSA      Studies:  Ct Abdomen Pelvis Wo Contrast  Result Date: 11/03/2017 CLINICAL DATA:  Abdominal distension with decreased ostomy output EXAM: CT ABDOMEN AND PELVIS WITHOUT CONTRAST TECHNIQUE: Multidetector CT imaging of the abdomen and pelvis was performed following the standard protocol without IV contrast. COMPARISON:  CT 07/10/2017, 05/22/2017, radiograph 11/02/2017 FINDINGS: Lower chest: Lung bases demonstrate small bilateral pleural effusions. No consolidation. Normal heart size. Hepatobiliary: No focal  liver abnormality is seen. No gallstones, gallbladder wall thickening, or biliary dilatation. Pancreas: Unremarkable. No pancreatic ductal dilatation or surrounding inflammatory changes. Spleen: Normal in size without focal abnormality. Adrenals/Urinary Tract: Right adrenal gland is normal. Mild to moderate right hydronephrosis and hydroureter as before. Absent left kidney. Bladder within normal limits. Stomach/Bowel: Mild-to-moderate fluid distention of stomach. Multiple dilated loops of proximal to mid small bowel, measuring up to 4.5 cm. Abduct transition to decompressed distal small bowel in the right mid to  lower abdomen, series 2, image number 43, with minimal contrast within decompressed small bowel loops distal to this. No appreciable contrast in the colon. Status post left lower quadrant double-barrel colostomy. Heterogeneous soft tissue thickening at the anus and rectum, corresponding to previously noted mass. Vascular/Lymphatic: Nonaneurysmal aorta. Limited evaluation for adenopathy due to diffuse abdominal fluid. Reproductive: No adnexal mass. Uterus inseparable from soft tissue mass at the rectum and anus. Other: No free air. Increased abdominal and pelvic ascites, now moderate. Diffuse subcutaneous edema. Musculoskeletal: No acute or suspicious abnormality. IMPRESSION: 1. Multiple dilated loops of small bowel with abduct transition to decompressed distal small bowel in the mid to lower abdomen slightly to the right of midline, consistent with small bowel obstruction. A small amount of contrast does pass into decompressed small bowel distal to the point of obstruction. 2. Interim development of small pleural effusions. Development of moderate abdominopelvic ascites. Diffuse subcutaneous edema consistent with anasarca 3. Status post left lower quadrant colostomy. Soft tissue enlargement of the anus and rectum, corresponding to previously demonstrated infiltrative mass in the region. 4. Similar mild to  moderate right hydronephrosis and slightly dilated right ureter. Electronically Signed   By: Donavan Foil M.D.   On: 11/03/2017 21:41   Dg Abd 1 View  Result Date: 11/03/2017 CLINICAL DATA:  Small-bowel obstruction EXAM: ABDOMEN - 1 VIEW COMPARISON:  07/14/2017 FINDINGS: Moderate small bowel dilatation. Colon decompressed. Left lower quadrant ostomy. No renal calculi. Normal skeletal structures. IMPRESSION: Small-bowel obstruction. Electronically Signed   By: Franchot Gallo M.D.   On: 11/03/2017 08:44   Ct Abdomen Pelvis W Contrast  Result Date: 11/17/2017 CLINICAL DATA:  Leukocytosis. Recent partial colectomy, small-bowel resection and colostomy. EXAM: CT ABDOMEN AND PELVIS WITH CONTRAST TECHNIQUE: Multidetector CT imaging of the abdomen and pelvis was performed using the standard protocol following bolus administration of intravenous contrast. CONTRAST:  150m ISOVUE-300 IOPAMIDOL (ISOVUE-300) INJECTION 61% COMPARISON:  Preoperative noncontrast abdominal CT 11/03/2017 FINDINGS: Lower chest: Moderate bilateral pleural effusions and adjacent airspace disease, on the right this represents compressive atelectasis, on the left may be atelectasis or pneumonia. The heart is normal in size. Prior left mastectomy. Hepatobiliary: No focal hepatic lesion. Gallbladder partially distended, no calcified gallstone. No biliary dilatation. Pancreas: No ductal dilatation or inflammation. Spleen: Normal in size without focal abnormality. Adrenals/Urinary Tract: No adrenal nodule. Prior left nephrectomy. Moderate right hydronephrosis and proximal hydroureter, not significantly changed from prior exams. More distal ureter is nondistended. Urinary bladder is partially distended. Posterior bladder wall thickening more prominent on the left. Stomach/Bowel: Gastrostomy tube in the stomach. Prior small bowel dilatation has resolved. Enteric sutures noted in small bowel in the lower pelvis. No bowel obstruction with enteric contrast  reaching the transverse colon and left mid abdominal colostomy. Stabled off transverse and descending colon are decompressed. Irregular enhancing rectal mass extending into the perirectal soft tissues and uterus, direct comparison difficult given lack of enhancement on prior exam. No peristomal hernia. Vascular/Lymphatic: Abdominal aorta is normal in caliber. Portal, splenic, and mesenteric veins are patent. No acute vascular findings. No definite enlarged abdominal or pelvic lymph nodes. Small retroperitoneal nodes are similar to prior exam. Reproductive: Rectal mass involves the uterus which is heterogeneous, enlarged, with ill-defined borders inferiorly. Other: Moderate volume intra-abdominal ascites which measures simple fluid density, however slight peritoneal enhancement is noted. Loculated pelvic fluid appears similar to prior exams. Generalized body wall edema and skin thickening. No free air in the abdomen. Midline skin staples noted. Musculoskeletal: Possible vague lucent lesion within  L1 and L3 vertebral bodies, not definitively seen on prior exams. No sclerotic lesions. IMPRESSION: 1. Recent small bowel resection and transverse colostomy. Moderate volume of intra-abdominal ascites with slight peritoneal enhancement, but no well-defined abscess. No free air. 2. Moderate bilateral pleural effusions and bibasilar airspace disease. On the right this is consistent with compressive atelectasis, on the left may be atelectasis or pneumonia in the setting of leukocytosis. Whole body wall edema and anasarca. 3. Enhancing rectal mass invading the uterus and possibly posterior wall of the bladder. 4. Stable right hydronephrosis and proximal hydroureter. 5. Possible lucent lesions within L1 and L3 vertebral bodies out or not seen on 07/10/2017 CT, and may represent osseous metastatic disease. This could be further assessed with nonemergent bone scan or PET-CT. Electronically Signed   By: Jeb Levering M.D.   On:  11/17/2017 00:19   US Renal  Result Date: 10/20/2017 CLINICAL DATA:  62 year old female with a history of hydronephrosis. Left-sided nephrectomy EXAM: RENAL / URINARY TRACT ULTRASOUND COMPLETE COMPARISON:  CT 07/10/2017, MR 10/05/2017 FINDINGS: Right Kidney: Length: 11.8 cm. Hydronephrosis. No significant renal cortical thinning. Echogenicity is similar to that of the adjacent liver parenchyma. Left Kidney: Left nephrectomy Bladder: Urinary bladder is decompressed. IMPRESSION: Right-sided hydronephrosis. Left nephrectomy. Electronically Signed   By: Corrie Mckusick D.O.   On: 10/20/2017 09:33   Dg Chest Port 1 View  Result Date: 11/16/2017 CLINICAL DATA:  Leukocytosis. EXAM: PORTABLE CHEST 1 VIEW COMPARISON:  Chest radiograph 11/11/2004, lung bases from abdominal CT 11/03/2017 FINDINGS: Left pleural effusion and basilar opacity, unchanged from prior exam. Small right pleural effusion has improved from prior exam. Mild residual right basilar atelectasis. Heart is normal in size. No new airspace disease. Tip of the right upper extremity PICC in the SVC. Left mastectomy with surgical clips in the chest wall. IMPRESSION: Left pleural effusion and basilar opacity are unchanged from chest radiograph 5 days ago. Basilar opacity likely secondary to atelectasis, sterility technically indeterminate by radiograph. Improved right pleural effusion and atelectasis from prior exam. No new airspace disease. Electronically Signed   By: Jeb Levering M.D.   On: 11/16/2017 20:42   Dg Chest Port 1 View  Result Date: 11/11/2017 CLINICAL DATA:  Acute kidney injury.  Chest pain. EXAM: PORTABLE CHEST 1 VIEW COMPARISON:  10/31/2016 FINDINGS: Right arm PICC tip in the SVC 4 cm above the right atrium. Bilateral pleural effusions with dependent atelectasis. Upper lungs are clear. Previous mastectomy on the left. No acute bone finding. IMPRESSION: Bilateral effusions with dependent pulmonary atelectasis. Electronically Signed   By:  Nelson Chimes M.D.   On: 11/11/2017 15:17   Dg Abd 2 Views  Result Date: 11/07/2017 CLINICAL DATA:  62 year old female with small bowel obstruction. Recent colonic surgery. Subsequent encounter. EXAM: ABDOMEN - 2 VIEW COMPARISON:  11/05/2017 plain film examination.  11/03/2017 CT. FINDINGS: Gas and fluid distended dilated small bowel loops more prominent than on the recent plain film examination with small bowel measuring up to 4.7 cm versus prior 4.5 cm. Small bowel fold thickening. Air-fluid levels. Interval development of gas filled stomach. Minimal residual contrast within colon. Colostomy left lower quadrant. No free intraperitoneal air noted. IMPRESSION: Progressive partial small bowel obstructive pattern. Interval development of gas-filled stomach. No free intraperitoneal air. These results will be called to the ordering clinician or representative by the Radiologist Assistant, and communication documented in the PACS or zVision Dashboard. Electronically Signed   By: Genia Del M.D.   On: 11/07/2017 09:05   Dg  Abd 2 Views  Result Date: 11/05/2017 CLINICAL DATA:  Per order- small bowel obstruction PT HX: GERD, bladder sugery EXAM: ABDOMEN - 2 VIEW COMPARISON:  CT 11/03/2017 and previous FINDINGS: Visualized lung bases clear.  No free air. Multiple dilated small bowel loops with fluid levels on the erect radiograph. Similar number of involved loops and degree of dilatation since previous radiograph 11/02/2017. There has been progression of oral contrast material into the proximal colon which is nondilated. There is a left lower quadrant ostomy device. No abnormal abdominal calcifications.  Regional bones unremarkable. IMPRESSION: 1. Findings consistent with persistent partial mid/distal small bowel obstruction. 2. No free air. Electronically Signed   By: Lucrezia Europe M.D.   On: 11/05/2017 12:23   [place] Ekg Site Rite  Result Date: 11/04/2017 If Site Rite image not attached, placement could not be  confirmed due to current cardiac rhythm.   Assessment: 62 y.o.  BRCA 1-2 negative [facility] woman with lobular breast cancer stage IV at presentation July 2011, admitted with recurren SBO in the setting of abdominal carcinomatosis  (1) status post left mastectomy and sentinel lymph node sampling in July 2011 for a lower inner quadrant T1 N1 M1, stage IV invasive lobular breast cancer, grade 1, strongly estrogen and progesterone receptor-positive, HER2 negative with MIB-1 of 9% and no HER2 amplification,   (2) with multiple sclerotic bone lesions at presentation seen only on CT scan (not on bone scan or PET scan), but  with biopsy-proven metastatic disease to bone; and an elevated CA 27.29 at presentation,   (3) Oncotype recurrence score of 4, predicts a good response to antiestrogens.  (4) Systemic treatment has consisted of             a) tamoxifen with evidence of response but poor tolerance             b) letrozole starting August 2012, discontinued October 2014 per patient              (5) single functioning kidney  (6) status post bilateral salpingo-oophorectomy 01/24/2013, with benign pathology  (7) osteoporosis; the patient refuses bisphosphonate therapy; started [facility] February 2014.               (a) bone density was obtained under the care of Dr. Matthew Saras at [facility], 06/25/2012, showing osteoporosis with a T score -2.6. This was repeated 12/20/2013, showing again osteoporosis with T-scores between -2.4 and -2.8.             (b) the patient refuses zolendronate, denosumab, or other pharmacologic intervention             (c) bone density on 12/30/2015 under Dr. Matthew Saras showing osteoporosis with T score -3.3  (8) the patient refuses standard Mammography or tomography; undergoing thermography screening of the right breast.  (9) PET scan 06/05/2015 shows rectal thickening and a presacral mass; biopsy of this area 10/09/2015 confirms metastatic  lobular breast cancer, again estrogen receptor positive, HER-2 nonamplified.             (a) pelvic MRI 03/11/2016 confirms stability of disease             (b) chest CT and pelvic MRI 09/02/2016 shows no change in the circumferential rectal thickening or evidence of extension beyond the serosa and chest CT findings             (c) colonoscopy 02/23/2017 showed a very narrow rectal lumen with a few apparently uninvolved centimeters distally              (  d) CT of the abdomen and pelvis 05/22/2017 (after diverting colostomy) shows interval increase in the size of the perirectal soft tissue masses.  (10) started fulvestrant and palbociclib 125 mg/ day [21/7] May 2017             (a) palbociclib dose decreased to 100 mg daily [21/7] with second cycle, started 11/25/2015             (b) palbociclib discontinued 03/20/2017 with evidence of disease progression             (c) last fulvestrant dose 03/24/2017, discontinued with evidence of progression  (11) status post colostomy placement at [facility] November 2018  (12) started exemestane 07/05/2017,             (a) everolimus 5 mg/d started 07/14/2017--held as of 08/20/2017 secondary to rash             (b) exemestane stopped by patient 09/04/2017 to try "the natural way".  (13) exemestane resumed 10/16/2017  (a) abemaciclib/Verzenio added 10/18/2017  (14) s/p laparoscopic small bowel resection for SBO 11/08/2017    Plan:  Sonia Hill is hoping to go home today or tomorrow. She has a poor understanding of the reason for her anasarca and we discussed that it is due to malnutrition and it will continue until she starts making her own protein. She is looking forward to having her legs wrapped by PT as outpatient. She understands need to continue TNA for now. She is very encouraged that she has been able to keep down most everything she was offered last 24+ hours, though she is still eating very small amounts. Colostomy seems to be  working.  She was reviewing the 20+ bottles of various "oils" she uses to treat herself.  I discussed results of her CT from last night, which shows primarily atelectasis. Wrote for IS.   I expect Winston will be going home soon, possibly today. She will be on exemestane for now and I will start the abemaciclib when she returns to see me in about a week--I will call her with that appointment.  I greatly appreciate surgery's and hospitalists' excellent care of this complex patient. Will sign off at this point.  Chauncey Cruel, MD 11/17/2017  7:53 AM Medical Oncology and Hematology [facility] [address] [place], [place] [address] Tel. [phone]    Fax. [phone]

## 2017-11-17 NOTE — Progress Notes (Signed)
PROGRESS NOTE    Sonia Hill  TKP:[id number] DOB: Jun 21, 1955 DOA: 11/03/2017 PCP: [name], MD   Brief Narrative:  The patient is a 49 female with metastatic breast cancer who sees Dr. Elza Rafter, Hx of Colostomy + rectal mass 04/2017-complicated by SBO-recurrent SBO 06/2017 and currently on liquid diet at home. She was admitted with severe excruciating abdominal pain no ostomy output. CT Abdomen showed worsening cancer. Underwent Surgery on 5/29 and had Diagnostic Laparoscopy, Lysis of Adhesions, Small Bowel Resection, Insertion of Gastrotomy Tube and Partial Colectomy with new Colostomy. Currently on TPN and diet being advanced to soft Diet and is doing better but WBC started Elevating so will pan-culture. CT of Abdomen and CXR show possible PNA so patient was started on Empiric Zosyn.  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    Ascites   New onset left bundle branch block (LBBB)   Metastatic breast cancer to small intestine causing SBO s/p SB resection 11/08/2017   Metastatic breast cancer to colostomy s/p colectomy/colostomy 11/08/2017   Cachexia ([place])   Malnutrition of moderate degree   Anemia  Malignant bowel obstruction/small bowel obstruction secondary to Breast Cancer Metastasis status post small bowel resection 11/08/2017 -Patient noted to have no significant improvement with small bowel obstruction and subsequently underwent laparoscopic diagnostic small bowel resection, insertion of gastrostomy tube, lysis of adhesion, partial  colectomy with new colostomy per Dr. Johney Maine 11/08/2017.   -NG tube has been discontinued per General Surgery.   -IV Robaxin 500 mg q8hhas been added to patient's regimen.   -Patient noted to have some bowel function.   -Tolerating Soft Diet but also will have TNA cycled.  -TNA per General Surgery  -Pain regimen has been adjusted per General Surgery; Continue current regimen  Hyponatremia, worsening  -Initially felt likely secondary to hypervolemic hyponatremia plus or minus SIADH and now likley from massive volume overloaded  -Patient noted to have lower extremity edema as well as low albumin levels.   -Patient given IV Lasix however worsening sodium levels down to 123  from 127 from 129; Sodium is now 125 and IV Diuresis with 80 mg BID started  -TSH within normal limits.   -Cortisol level within normal limits.  -Nephrology consulted to assessed the patient and feel hyponatremia may be secondary to hypervolemia with anasarca.   -Patient was started on IV albumin and low-dose salt tablets as well as compression stockings but Salt Tablets have been stopped now; Will give more doses of Albumin -Sodium levels currently at 125 and down from 128.  -Transitioned to oral Lasix 40 mg daily once tolerating soft Diet but will continue IV Furosemide 80 mg BID as she is massively volume overloaded  -Nephrology following and appreciate input and recommendations.   Hypokalemia/hypocalcemia/hypomagnesemia -Continue TPN/TNA per General Surgery  -Electrolytes being repleted per pharmacy.    Hypothyroidism  -TSH 3.123.  IV Synthroid and changed to po today  -C/w po 100 mcg po Daily .    Metastatic estrogen positive lobular breast cancer -Patient with metastatic spread to the spine and rectal area resulting in blockage and diverging  colostomy.   -Oncology following and feel as if she is turning the corner -Dr. [name] recommending resuming Exemestane at this point and continue to hold Abemaciclib    Solitary kidney with Hydronephrosis -Status post Foley catheter placement.   -Renal function stable. Urology consulted and no further work-up needed during this hospitalization.  Outpatient follow-up.    Anxiety -Ativan as needed.  Severe protein calorie malnutrition -On TPN per General Surgery  -Nutrition is consulted and appreciate further evaluation recommendations -Continue Ensure Enlive 237 mL's p.o. 3 times daily with meals  Volume Overload/Anasarca -Patient with lower extremity edema, abdominal swelling and anasarca  -Likely component of hypoalbuminemia. -Lasix was discontinued due to worsening sodium levels.  Patient was given dose of IV albumin per nephrology on 11/11/2017.   -IV Lasix has been resumed per nephrology and dose increased to 80 mg; Resumed IV Lasix at 80 mg BID and will hold po doses  -Urine output not accurately recorded.   -Continue to MonitorStrict I's and O's.  Daily weights. -Patient is +18.975 Liters since Admission and Weight is now 128 (up from 108 on Admission) -Ordered Thoracentesis and Paracentesis   New left bundle branch block -Noted on EKG the night of 11/08/2017.   -Patient denies any chest pain no shortness of breath.   -2D echo with a EF of 65 to 70%, no wall motion abnormalities, left ventricular diastolic function normal.   -Patient has been seen in consultation by Cardiology and no further ischemic work-up recommended at this time.   Singultus/Hiccups, improved -Likely secondary to malignant bowel obstruction.   -Patient on Reglan which has been placed 3 times daily at a lower dose per oncology but now Stopped.  -Patient given a dose of Thorazine however was having's some side effects from it and as such Thorazine has been discontinued. -Patient given Cogentin 1 mg IV every 12 hours x2 doses.   -Clinical improvement with hiccups noted  Normocytic Anemia -Patient is status post 2 units packed red blood cells 11/12/2017. -Patient denies  any overt bleeding.   -Colostomy with small brown stool.  FOBT was positive.  Anemia panel has been ordered and looks like patient does have a severe iron deficiency anemia with iron level less than 5.   -IV Feraheme x1.  LDH, haptoglobin, hepatic panel not consistent with hemolysis.  CT abdomen and pelvis was not done.   -Patient was seen in consultation by Gastroenterology who are recommending serial CBCs.  Transfuse as needed.   -Hb/Hct stable at 10.0/28.5 -Continue to Monitor for S/Sx of Bleeding -Repeat CBC in AM  Leukocytosis likely in the setting of PNA and SBP -Worsening -WBC went from 9.6 -> 14.2 -> 17.3 -> 19.1 -Urinalysis Rechecked along with Urine Culture and Urinalysis Negative  -Last Urine Cx on 6/1 showed 30,000 CFU of Pseudomonas Aeruginosa; Repeat Cx showed Multiple Species Present  -Not on Abx but will start on Empiric Zosyn given possible Pneumonia noted on CT Scan of Abdomen and CXR -Repeat Blood Cultures pending and CXR showed Left pleural effusion and basilar opacity are unchanged from chest radiograph 5 days ago. Basilar opacity likely secondary to atelectasis, sterility technically indeterminate by radiograph. Improved right pleural effusion and atelectasis from prior exam. Nonew airspace disease. -Checked CT Abdomen and Pelvis showed Recent small bowel resection and transverse colostomy. Moderate volume of intra-abdominal ascites with slight peritoneal enhancement, but no well-defined abscess. No free air. Moderate bilateral pleural effusions and bibasilar airspace disease. On the right this is consistent with compressive atelectasis,  on the left may be atelectasis or pneumonia in the setting of leukocytosis. Whole body wall edema and anasarca. Enhancing rectal mass invading the uterus and possibly posterior wall of the bladder. Stable right hydronephrosis and proximal hydroureter. Possible lucent lesions within L1 and L3 vertebral bodies out or not seen on 07/10/2017 CT,  and may represent osseous metastatic Disease -Ordered Thoracentesis and Paracentesis  -Paracentesis done and showed Cloudy Appearance with 2837 WBC's and 82 Neutrophils with other Cx studies pending  -Repeat CBC in AM   Abnormal LFT's/Elevated Alk Phos,improving  -AST was 108, ALT was 108, and Alk Phos was 471; Now improved and AST is 37, AL:T is 53 and Alk Phos is 363 -Likely from TNA/TPN -Continue to Monitor and Repeat CMP in AM   DVT prophylaxis: SCDs Code Status: FULL CODE Family Communication: Discussed with Husband and Friend at bedside Disposition Plan: Anticipate D/C Home in next 1-2 days if medically stable  Consultants:   General Surgery: [name] 11/04/2017  Palliative care: Dr. Rowe Pavy 10/30/2017  Cardiology: Dr.Skains 11/10/2017  Nephrology: Dr: Jonnie Finner 11/11/2017  Gastroenterology: Dr. Cristina Gong 11/12/2017  Urology: Dr. Alinda Money 11/13/2017  Pharmacy for TNA  Procedures: S/P diagnostic laparoscopy, LOA, SBR, insertion gastrostomy tube, partial colectomy new colostomy 11/08/17 Dr. Johney Maine  Antimicrobials:  Anti-infectives (From admission, onward)   Start     Dose/Rate Route Frequency Ordered Stop   11/17/17 1030  piperacillin-tazobactam (ZOSYN) IVPB 3.375 g     3.375 g 12.5 mL/hr over 240 Minutes Intravenous Every 8 hours 11/17/17 1016     11/09/17 0000  cefoTEtan (CEFOTAN) 2 g in sodium chloride 0.9 % 100 mL IVPB     2 g 200 mL/hr over 30 Minutes Intravenous Every 12 hours 11/08/17 1748 11/09/17 0021   11/08/17 1021  sodium chloride 0.9 % with cefoTEtan (CEFOTAN) ADS Med    Note to Pharmacy:  Marchia Meiers   : cabinet override      11/08/17 1021 11/08/17 2229   11/08/17 0600  cefoTEtan (CEFOTAN) 2 g in sodium chloride 0.9 % 100 mL IVPB  Status:  Discontinued    Note to Pharmacy:  Pharmacy may adjust dose strength for optimal dosing.   Send with patient on call to the OR.  Anesthesia to complete antibiotic administration <76[facility]   2  g 200 mL/hr over 30 Minutes Intravenous On call to O.R. 11/07/17 1234 11/07/17 1433   11/08/17 0600  cefoTEtan in Dextrose 5% (CEFOTAN) IVPB 2 g     2 g Intravenous On call to O.R. 11/07/17 1505 11/08/17 1207   11/07/17 1500  cefoTEtan in Dextrose 5% (CEFOTAN) IVPB 2 g  Status:  Discontinued     2 g Intravenous On call to O.R. 11/07/17 1433 11/07/17 1505     Subjective: Seen and examined and was disappointed about not going home.  WBC was increasing and told her that we need to look for infection and she was understandable.  Mains massively volume overloaded so diuresis was increased.  Discussed with her about paracentesis and thoracentesis and she is agreeable.  No chest pain, nausea, vomiting however did have some shortness of breath earlier.  Also did complain of some abdominal pain but attributed to her surgery.  No other concerns or complaints at this time  Objective: Vitals:   11/17/17 0700 11/17/17 1506 11/17/17 1604 11/17/17 1618  BP:  (!) 112/59 125/70 114/68  Pulse:  (!) 107    Resp:  16    Temp:  97.7 F (36.5 C)    TempSrc:  Oral    SpO2:  100%    Weight: 58.1 kg (128 lb)     Height:        Intake/Output Summary (Last 24 hours) at 11/17/2017 1740 Last data filed at 11/17/2017 1647 Gross per 24 hour  Intake 840 ml  Output 1302 ml  Net -462 ml   Filed Weights   11/15/17 0622 11/16/17 0625 11/17/17 0700  Weight: 55.2 kg (121 lb 11.2 oz) 55.6 kg (122 lb 9.2 oz) 58.1 kg (128 lb)   Examination: Physical Exam:  Constitutional: Thin Caucasian female who is currently in no acute distress appears anxious Eyes: Conjunctive are normal.  Sclera anicteric ENMT: External ears and nose appear normal.  Grossly normal hearing. Neck: Supple no JVD Respiratory: Diminished to auscultation bilaterally with some mild crackles.  No appreciable wheezing, rales, rhonchi.  Normal respiratory effort however is not using accessory muscles to breathe. Cardiovascular: Tachycardic rate and  rhythm; no appreciable murmurs, rubs, gallops.  Has significant to 3+ lower extremity edema and abdominal edema Abdomen: Soft, tender, significantly distended with abdominal wall edema.  Bowel sounds present x4, colostomy bag with brown stool wound; VAC is been removed GU: Deferred Musculoskeletal: No contractures cyanosis.  No joint deformities noted.  PICC line in place Skin: Warm and dry with no appreciable rashes or lesions limited skin evaluation.  Wound VAC is been removed.  Significant lower extremity swelling and abdominal swelling with anasarca Neurologic: Cranial nerves II through XII grossly intact no appreciable focal deficits Psychiatric: Anxious mood and affect.  Intact judgment and insight.  Patient is awake and alert and oriented x3  Data Reviewed: I have personally reviewed following labs and imaging studies  CBC: Recent Labs  Lab 11/13/17 0648 11/14/17 1000 11/15/17 0445 11/16/17 0416 11/16/17 1637 11/17/17 0909  WBC 5.2 8.7 9.6 14.2* 17.3* 19.1*  NEUTROABS 4.2 7.4  --  12.1* 15.2* 17.2*  HGB 9.9* 11.4* 10.5* 10.7* 10.8* 10.0*  HCT 27.8* 32.3* 29.6* 30.8* 30.6* 28.5*  MCV 82.2 82.0 81.3 83.9 81.4 83.8  PLT 160 164 191 265 290 326   Basic Metabolic Panel: Recent Labs  Lab 11/11/17 0508  11/13/17 0648 11/14/17 0431 11/15/17 0445 11/16/17 0416 11/17/17 0352  NA 123*   < > 130* 129* 128* 128* 125*  K 4.1   < > 3.0* 3.6 3.5 3.5 3.5  CL 93*   < > 96* 95* 95* 93* 90*  CO2 24   < > '24 24 29 26 25  ' GLUCOSE 106*   < > 99 99 109* 99 100*  BUN 31*   < > 27* 24* 25* 26* 26*  CREATININE 0.90   < > 0.64 0.53 0.51 0.58 0.52  CALCIUM 8.9   < > 9.4 9.2 8.8* 8.7* 8.1*  MG 2.0  --  1.8 2.0 1.9 1.8 1.7  PHOS 2.5  --  3.1  --  3.4 3.6 3.6   < > = values in this interval not displayed.   GFR: Estimated Creatinine Clearance: 58.1 mL/min (by C-G formula based on SCr of 0.52 mg/dL). Liver Function Tests: Recent Labs  Lab 11/12/17 1353 11/13/17 0648 11/16/17 0416  11/17/17 0352  AST 36 52* 108* 37  ALT 11* 21 108* 53  ALKPHOS 79 146* 471* 363*  BILITOT 0.8 1.5* 1.1 1.0  PROT 5.2* 5.5* 5.1* 4.7*  ALBUMIN 3.7 3.8 2.5* 2.1*   No results for input(s): LIPASE, AMYLASE in the last 168  hours. No results for input(s): AMMONIA in the last 168 hours. Coagulation Profile: No results for input(s): INR, PROTIME in the last 168 hours. Cardiac Enzymes: No results for input(s): CKTOTAL, CKMB, CKMBINDEX, TROPONINI in the last 168 hours. BNP (last 3 results) No results for input(s): PROBNP in the last 8760 hours. HbA1C: No results for input(s): HGBA1C in the last 72 hours. CBG: Recent Labs  Lab 11/12/17 1205 11/12/17 1747 11/12/17 2344 11/13/17 0634 11/13/17 1128  GLUCAP 88 81 105* 91 90   Lipid Profile: No results for input(s): CHOL, HDL, LDLCALC, TRIG, CHOLHDL, LDLDIRECT in the last 72 hours. Thyroid Function Tests: No results for input(s): TSH, T4TOTAL, FREET4, T3FREE, THYROIDAB in the last 72 hours. Anemia Panel: No results for input(s): VITAMINB12, FOLATE, FERRITIN, TIBC, IRON, RETICCTPCT in the last 72 hours. Sepsis Labs: No results for input(s): PROCALCITON, LATICACIDVEN in the last 168 hours.  Recent Results (from the past 240 hour(s))  Culture, Urine     Status: Abnormal   Collection Time: 11/08/17  9:54 AM  Result Value Ref Range Status   Specimen Description   Final    URINE, CLEAN CATCH Performed at [facility], [place] [address], [place], [place] [address]    Special Requests   Final    NONE Performed at [facility], [place] [address] [place], [place] [address]    Culture (A)  Final    <10,000 COLONIES/mL INSIGNIFICANT GROWTH Performed at [place] [address], [place], [place] [address]    Report Status 11/09/2017 FINAL  Final  Culture, Urine     Status: Abnormal   Collection Time: 11/11/17  8:18 AM  Result Value Ref Range Status   Specimen Description   Final    URINE,  RANDOM Performed at [place] [address], [place], [place] [address]    Special Requests   Final    NONE Performed at [facility], [place] [address], [place], [place] [address]    Culture 30,000 COLONIES/mL PSEUDOMONAS AERUGINOSA (A)  Final   Report Status 11/13/2017 FINAL  Final   Organism ID, Bacteria PSEUDOMONAS AERUGINOSA (A)  Final      Susceptibility   Pseudomonas aeruginosa - MIC*    CEFTAZIDIME <=1 SENSITIVE Sensitive     CIPROFLOXACIN <=0.25 SENSITIVE Sensitive     GENTAMICIN <=1 SENSITIVE Sensitive     IMIPENEM 1 SENSITIVE Sensitive     PIP/TAZO <=4 SENSITIVE Sensitive     CEFEPIME <=1 SENSITIVE Sensitive     * 30,000 COLONIES/mL PSEUDOMONAS AERUGINOSA  Culture, Urine     Status: Abnormal   Collection Time: 11/16/17 10:55 AM  Result Value Ref Range Status   Specimen Description   Final    URINE, CLEAN CATCH Performed at [facility], [place] [address] [place], [place] [address]    Special Requests   Final    NONE Performed at [facility], [place] [address] [place], [place] [address]    Culture MULTIPLE SPECIES PRESENT, SUGGEST RECOLLECTION (A)  Final   Report Status 11/17/2017 FINAL  Final    Radiology Studies: Ct Abdomen Pelvis W Contrast  Result Date: 11/17/2017 CLINICAL DATA:  Leukocytosis. Recent partial colectomy, small-bowel resection and colostomy. EXAM: CT ABDOMEN AND PELVIS WITH CONTRAST TECHNIQUE: Multidetector CT imaging of the abdomen and pelvis was performed using the standard protocol following bolus administration of intravenous contrast. CONTRAST:  120m ISOVUE-300 IOPAMIDOL (ISOVUE-300) INJECTION 61% COMPARISON:  Preoperative noncontrast abdominal  CT 11/03/2017 FINDINGS: Lower chest: Moderate bilateral pleural effusions and adjacent airspace disease, on the right this represents compressive atelectasis, on the left may be atelectasis or pneumonia. The heart is normal in size.  Prior left mastectomy. Hepatobiliary: No focal hepatic lesion. Gallbladder partially distended, no calcified gallstone. No biliary dilatation. Pancreas: No ductal dilatation or inflammation. Spleen: Normal in size without focal abnormality. Adrenals/Urinary Tract: No adrenal nodule. Prior left nephrectomy. Moderate right hydronephrosis and proximal hydroureter, not significantly changed from prior exams. More distal ureter is nondistended. Urinary bladder is partially distended. Posterior bladder wall thickening more prominent on the left. Stomach/Bowel: Gastrostomy tube in the stomach. Prior small bowel dilatation has resolved. Enteric sutures noted in small bowel in the lower pelvis. No bowel obstruction with enteric contrast reaching the transverse colon and left mid abdominal colostomy. Stabled off transverse and descending colon are decompressed. Irregular enhancing rectal mass extending into the perirectal soft tissues and uterus, direct comparison difficult given lack of enhancement on prior exam. No peristomal hernia. Vascular/Lymphatic: Abdominal aorta is normal in caliber. Portal, splenic, and mesenteric veins are patent. No acute vascular findings. No definite enlarged abdominal or pelvic lymph nodes. Small retroperitoneal nodes are similar to prior exam. Reproductive: Rectal mass involves the uterus which is heterogeneous, enlarged, with ill-defined borders inferiorly. Other: Moderate volume intra-abdominal ascites which measures simple fluid density, however slight peritoneal enhancement is noted. Loculated pelvic fluid appears similar to prior exams. Generalized body wall edema and skin thickening. No free air in the abdomen. Midline skin staples noted. Musculoskeletal: Possible vague lucent lesion within L1 and L3 vertebral bodies, not definitively seen on prior exams. No sclerotic lesions. IMPRESSION: 1. Recent small bowel resection and transverse colostomy. Moderate volume of intra-abdominal ascites  with slight peritoneal enhancement, but no well-defined abscess. No free air. 2. Moderate bilateral pleural effusions and bibasilar airspace disease. On the right this is consistent with compressive atelectasis, on the left may be atelectasis or pneumonia in the setting of leukocytosis. Whole body wall edema and anasarca. 3. Enhancing rectal mass invading the uterus and possibly posterior wall of the bladder. 4. Stable right hydronephrosis and proximal hydroureter. 5. Possible lucent lesions within L1 and L3 vertebral bodies out or not seen on 07/10/2017 CT, and may represent osseous metastatic disease. This could be further assessed with nonemergent bone scan or PET-CT. Electronically Signed   By: Jeb Levering M.D.   On: 11/17/2017 00:19   US Paracentesis  Result Date: 11/17/2017 INDICATION: Patient with history of stage IV lobular breast cancer, recent small bowel resection/transverse colostomy, ascites, bilateral pleural effusions, rectal mass. Request made for diagnostic and therapeutic paracentesis. EXAM: ULTRASOUND GUIDED DIAGNOSTIC AND THERAPEUTIC PARACENTESIS MEDICATIONS: None COMPLICATIONS: None immediate. PROCEDURE: Informed written consent was obtained from the patient after a discussion of the risks, benefits and alternatives to treatment. A timeout was performed prior to the initiation of the procedure. Initial ultrasound scanning demonstrates a small to moderate amount of loculated ascites within the right lower abdominal quadrant. The right lower abdomen was prepped and draped in the usual sterile fashion. 1% lidocaine was used for local anesthesia. Following this, a 6 Fr Safe-T-Centesis catheter was introduced. An ultrasound image was saved for documentation purposes. The paracentesis was performed. The catheter was removed and a dressing was applied. The patient tolerated the procedure well without immediate post procedural complication. FINDINGS: A total of approximately 570 cc of hazy,  amber fluid was removed. Samples were sent to the laboratory as requested by the clinical team. Due  to the multiloculated nature of the ascites only the above amount of fluid could be removed today. IMPRESSION: Successful ultrasound-guided diagnostic and therapeutic paracentesis yielding 570 cc of peritoneal fluid. Read by: Rowe Robert, PA-C Electronically Signed   By: Sandi Mariscal M.D.   On: 11/17/2017 16:31   Dg Chest Port 1 View  Result Date: 11/16/2017 CLINICAL DATA:  Leukocytosis. EXAM: PORTABLE CHEST 1 VIEW COMPARISON:  Chest radiograph 11/11/2004, lung bases from abdominal CT 11/03/2017 FINDINGS: Left pleural effusion and basilar opacity, unchanged from prior exam. Small right pleural effusion has improved from prior exam. Mild residual right basilar atelectasis. Heart is normal in size. No new airspace disease. Tip of the right upper extremity PICC in the SVC. Left mastectomy with surgical clips in the chest wall. IMPRESSION: Left pleural effusion and basilar opacity are unchanged from chest radiograph 5 days ago. Basilar opacity likely secondary to atelectasis, sterility technically indeterminate by radiograph. Improved right pleural effusion and atelectasis from prior exam. No new airspace disease. Electronically Signed   By: Jeb Levering M.D.   On: 11/16/2017 20:42    Scheduled Meds: . acetaminophen  1,000 mg Oral Q8H  . exemestane  25 mg Oral QPC breakfast  . famotidine  20 mg Oral BID  . feeding supplement (ENSURE ENLIVE)  237 mL Oral TID WC  . feeding supplement (PRO-STAT SUGAR FREE 64)  30 mL Oral BID  . furosemide  80 mg Intravenous Q12H  . gabapentin  300 mg Oral TID  . levothyroxine  50 mcg Intravenous Daily  . lidocaine      . lip balm  1 application Topical BID  . LORazepam  0.5 mg Intravenous Q24H  . methocarbamol  500 mg Oral Q8H  . [START ON 11/18/2017] multivitamin with minerals  1 tablet Oral Daily   Continuous Infusions: . albumin human 25 g (11/17/17 1126)  . [place] KitchenTPN  (CLINIMIX-E) Adult     And  . Fat emulsion    . magnesium sulfate 1 - 4 g bolus IVPB 2 g (11/17/17 1647)  . piperacillin-tazobactam (ZOSYN)  IV Stopped (11/17/17 1656)  . [place] KitchenTPN (CLINIMIX-E) Adult 106 mL/hr at 11/16/17 1915    LOS: 63 days   [name], [place] [facility] Pager [phone]  If 7PM-7AM, please contact night-coverage [email or web address] Password [email or web address] 11/17/2017, 5:40 PM

## 2017-11-17 NOTE — Progress Notes (Signed)
[place] NOTE   Pharmacy Consult for TPN Indication: Intolerance to enteral feeding   Patient Measurements: Height: 5' (152.4 cm) Weight: 122 lb 9.2 oz (55.6 kg) IBW/kg (Calculated) : 45.5 TPN AdjBW (KG): 49 Body mass index is 23.94 kg/m.   Insulin Requirements: none, DC insulin on 6/3  Current Nutrition: GT clamped 6/3 - Per MD notes, Eating small amounts of soft foods at each meal. - Pt reports she eats small bites of a variety of foods TID + Orgain Protein shakes (2-3 / day) - Add Prostat BID (provides 15 g protein, 100 kcals) added 6/7  IVF: none ordered  Central access: PICC 5/25 TPN start date: 5/28  ASSESSMENT                                                                                                          HPI:  Patient has metastatic breast cancer and partial small bowel obstruction on full liquids for 4 months, now intolerance to clear liquids.  Significant events:  5/29 surgery 5/31 advanced to CLD but did not tolerate 6/3 GT clamped, FLD ordered 6/5 advanced to soft diet 6/6 request to cycle TPN as pt to go home with TPN 6/7 Reduce rate of TPN from goal rate 1.8L to 1L since pt is eating and to minimize volume.  Paracentesis and Thoracentesis ordered.  Today:   Glucose - at goal of <026 (during cyclic TPN infusion)   Electrolytes - Na low and decreased to 125, K 3.5, mag 1.7, phos 3.6   HypoN:  see nephrologist note (6/3, Hypervolemic hyponatremia) IV albumin & salt tabs d/cd 6/3; restarted IV albumin and increased IV lasix 6/7 for massive anasarca, pitting edema.  Renal - WNL  LFTs - AST,ALT and Alk phos elevated  TGs - 152, 157 ( 6/3)  Prealbumin - 10.7 ( 5/29);  <5 (6/3)   NUTRITIONAL GOALS                                                                                             RD recs: 5/30 1700-1800 Kcal and 85-95 gm protein Clinimix E 5/15 at a goal rate of 75 ml/hr + 20% fat emulsion at  60m/hr to provide: 90 g/day protein, 1758 Kcal/day. - Reduced rate:  Clinimix E 5/15 1L / 24 hrs + 20% fat emulsion at 240 ml / 24 hrs to provide: 50 g/day protein, 710 Kcal/day.    PLAN  Now: Magnesium 2g IV x1 dose  At 1800 today:  Reduce TPN to 1L   Cycle Clinimix E 5/15 1L over 16 hours: 50 ml x 1 hour then 64 ml x 14 hours then 31m x 1 hour then stop  20% fat emulsion at 239mhr x 12hours  Change to PO standard multivitamins and trace elements.  Calorie count x 24 hrs  Add Prostat BID  BMET, Mag and Phos on Sat/Sun.    TPN lab panels on Mondays & Thursdays.  F/u daily.  ChGretta ArabharmD, BCPS Pager [phone]/12/2017 7:41 AM

## 2017-11-17 NOTE — Evaluation (Signed)
Physical Therapy Evaluation Patient Details Name: Sonia [facility] MRN: [id number] DOB: [birth year] Today's Date: 11/17/2017   History of Present Illness  62 year old female was admitted with abdomen distention. PMH:  metastatic breast CA, colostomy, arthritis and CKD.  s/p small bowel resection, insertion of g tube, lysis of adhesions, partial colectomy and new colostomy  Clinical Impression  On eval, pt required Min assist for mobility. She walked ~450 feet with a RW. Practiced bed mobility x 2. Encouraged pt to use logroll technique for bed mobility when she's getting up with nursing/family to get more practice with it. Provided LE ROM exercise education (AP,HS, and QS). Recommend HHPT and a home health aide, if possible. Recommend RW for ambulation as well.     Follow Up Recommendations Home health PT(Home Health Aide, if possible)    Equipment Recommendations  Rolling walker with 5" wheels    Recommendations for Other Services       Precautions / Restrictions Precautions Precautions: Fall Precaution Comments: L colostomy, G tube Restrictions Weight Bearing Restrictions: No      Mobility  Bed Mobility Overal bed mobility: Needs Assistance Bed Mobility: Rolling;Sidelying to Sit;Sit to Sidelying Rolling: Min assist Sidelying to sit: Min assist     Sit to sidelying: Min assist General bed mobility comments: Increased time. Demonstration + multimodal cueing for rolling, sidelying<>sit technique for bed mobility.   Transfers Overall transfer level: Needs assistance Equipment used: Rolling walker (2 wheeled) Transfers: Sit to/from Stand Sit to Stand: Min guard         General transfer comment: close guard for safety. VCs safety, technique, hand/LE placement. Verbally reviewed technique for using platform step to aid sit<>stand from tall bed.   Ambulation/Gait Ambulation/Gait assistance: Min guard Ambulation Distance (Feet): 450 Feet Assistive device: Rolling walker (2  wheeled) Gait Pattern/deviations: Step-through pattern;Trunk flexed     General Gait Details: Cues for safety, posture, distance from RW. Slow gait speed. Dyspnea 2/4. O2 sat 92% on RA.   Stairs            Wheelchair Mobility    Modified Rankin (Stroke Patients Only)       Balance Overall balance assessment: Needs assistance           Standing balance-Leahy Scale: Fair                               Pertinent Vitals/Pain Pain Assessment: Faces Faces Pain Scale: Hurts little more Pain Location: abdomen Pain Descriptors / Indicators: Sore Pain Intervention(s): Monitored during session;Repositioned    Home Living Family/patient expects to be discharged to:: Private residence Living Arrangements: Spouse/significant other Available Help at Discharge: Family   Home Access: Stairs to enter   Technical brewer of Steps: 6 Home Layout: Able to live on main level with bedroom/bathroom Home Equipment: None      Prior Function Level of Independence: Independent               Hand Dominance        Extremity/Trunk Assessment   Upper Extremity Assessment Upper Extremity Assessment: Generalized weakness    Lower Extremity Assessment Lower Extremity Assessment: Generalized weakness    Cervical / Trunk Assessment Cervical / Trunk Assessment: Normal  Communication   Communication: No difficulties  Cognition Arousal/Alertness: Awake/alert Behavior During Therapy: WFL for tasks assessed/performed Overall Cognitive Status: Within Functional Limits for tasks assessed  General Comments      Exercises     Assessment/Plan    PT Assessment Patient needs continued PT services  PT Problem List Decreased mobility;Decreased activity tolerance;Decreased strength;Decreased balance;Decreased range of motion;Decreased knowledge of use of DME;Pain       PT Treatment Interventions DME  instruction;Gait training;Functional mobility training;Therapeutic activities;Balance training;Patient/family education;Therapeutic exercise    PT Goals (Current goals can be found in the Care Plan section)  Acute Rehab PT Goals Patient Stated Goal: return to independence PT Goal Formulation: With patient/family Time For Goal Achievement: 12/01/17 Potential to Achieve Goals: Good    Frequency Min 3X/week   Barriers to discharge        Co-evaluation               AM-PAC PT "6 Clicks" Daily Activity  Outcome Measure Difficulty turning over in bed (including adjusting bedclothes, sheets and blankets)?: A Lot Difficulty moving from lying on back to sitting on the side of the bed? : Unable Difficulty sitting down on and standing up from a chair with arms (e.g., wheelchair, bedside commode, etc,.)?: Unable Help needed moving to and from a bed to chair (including a wheelchair)?: A Little Help needed walking in hospital room?: A Little Help needed climbing 3-5 steps with a railing? : A Lot 6 Click Score: 12    End of Session   Activity Tolerance: Patient tolerated treatment well Patient left: in bed;with call bell/phone within reach;with family/visitor present   PT Visit Diagnosis: Muscle weakness (generalized) (M62.81);Other abnormalities of gait and mobility (R26.89)    Time: 3335-4562 PT Time Calculation (min) (ACUTE ONLY): 43 min   Charges:   PT Evaluation $PT Eval Moderate Complexity: 1 Mod PT Treatments $Gait Training: 8-22 mins $Therapeutic Activity: 23-37 mins   PT G Codes:          Weston Anna, MPT Pager: [phone]

## 2017-11-17 NOTE — Consult Note (Addendum)
[facility] Nurse ostomy follow up Stoma type/location:Colostomypouch intact with good seal, pt is familiar with pouching routines since she had one prior to admission and denies further questions. She was using a 2 piece Hollister system and barrier ring and can continue to use the same pouches, opening will need to be cut slightly larger and more oval than she was previously doing.Applied 2 piece pouch and barrier ring, stoma is 90% red, 10% slough to edges, sutures visible, slightly above skin level.   Output:small amt liquidbrown stool Ostomy pouching: 2pc.2 1/4" pouch with barrier ring Education provided:none needed.  Enrolled patient in [facility] Discharge program:Yes, Pt was enrolled in a program for supplies prior to admission; called to have cut to fit samples delivered.5 sets of supplies left at the bedside for staff nurse use.  [facility] Nurse wound consult note Surgical team following for assessment and plan of care to abd wound. PA at the bedside to assess abd wound and discontinued the Vac.  Mosit gauze dressing applied and demonstrated process to patient and husband. Wound type:full thickness midlinesurgicalwound; continues to decrease in size and depth. Drainage (amount, consistency, odor)minimalamt pink drainage Periwound: ostomy, PEG tube in close proximity  Please re-consult if further assistance is needed.  Thank-you,  Julien Girt MSN, [place], [place], [place], [place]

## 2017-11-17 NOTE — Procedures (Signed)
Ultrasound-guided diagnostic and therapeutic paracentesis performed yielding 570 cc of hazy,amber fluid. No immediate complications. The fluid was submitted to the laboratory for preordered studies.  Due to the multiloculated nature of the ascites only the above amount of fluid could be removed today.

## 2017-11-18 ENCOUNTER — Inpatient Hospital Stay (HOSPITAL_COMMUNITY): Payer: 59

## 2017-11-18 LAB — COMPREHENSIVE METABOLIC PANEL
ALBUMIN: 3 g/dL — AB (ref 3.5–5.0)
ALT: 36 U/L (ref 14–54)
AST: 31 U/L (ref 15–41)
Alkaline Phosphatase: 323 U/L — ABNORMAL HIGH (ref 38–126)
Anion gap: 11 (ref 5–15)
BILIRUBIN TOTAL: 1.3 mg/dL — AB (ref 0.3–1.2)
BUN: 29 mg/dL — AB (ref 6–20)
CHLORIDE: 90 mmol/L — AB (ref 101–111)
CO2: 28 mmol/L (ref 22–32)
CREATININE: 0.67 mg/dL (ref 0.44–1.00)
Calcium: 8.6 mg/dL — ABNORMAL LOW (ref 8.9–10.3)
GFR calc Af Amer: >60 mL/min (ref >60)
GLUCOSE: 106 mg/dL — AB (ref 65–99)
POTASSIUM: 3.2 mmol/L — AB (ref 3.5–5.1)
Sodium: 129 mmol/L — ABNORMAL LOW (ref 135–145)
TOTAL PROTEIN: 5.7 g/dL — AB (ref 6.5–8.1)

## 2017-11-18 LAB — BODY FLUID CELL COUNT WITH DIFFERENTIAL
Lymphs, Fluid: 40 %
MONOCYTE-MACROPHAGE-SEROUS FLUID: 26 % — AB (ref 50–90)
NEUTROPHIL FLUID: 34 % — AB (ref 0–25)
WBC FLUID: 240 uL (ref 0–1000)

## 2017-11-18 LAB — CBC WITH DIFFERENTIAL/PLATELET
BASOS ABS: 0 10*3/uL (ref 0.0–0.1)
BASOS PCT: 0 %
Eosinophils Absolute: 0.1 10*3/uL (ref 0.0–0.7)
Eosinophils Relative: 0 %
HEMATOCRIT: 28.5 % — AB (ref 36.0–46.0)
Hemoglobin: 10 g/dL — ABNORMAL LOW (ref 12.0–15.0)
LYMPHS PCT: 6 %
Lymphs Abs: 1.1 10*3/uL (ref 0.7–4.0)
MCH: 29 pg (ref 26.0–34.0)
MCHC: 35.1 g/dL (ref 30.0–36.0)
MCV: 82.6 fL (ref 78.0–100.0)
Monocytes Absolute: 0.6 10*3/uL (ref 0.1–1.0)
Monocytes Relative: 3 %
NEUTROS ABS: 17.1 10*3/uL — AB (ref 1.7–7.7)
NEUTROS PCT: 91 %
Platelets: 401 10*3/uL — ABNORMAL HIGH (ref 150–400)
RBC: 3.45 MIL/uL — AB (ref 3.87–5.11)
RDW: 15.1 % (ref 11.5–15.5)
WBC: 18.9 10*3/uL — AB (ref 4.0–10.5)

## 2017-11-18 LAB — AMYLASE, PLEURAL OR PERITONEAL FLUID: Amylase, Fluid: 48 U/L

## 2017-11-18 LAB — MAGNESIUM: Magnesium: 2.1 mg/dL (ref 1.7–2.4)

## 2017-11-18 LAB — PHOSPHORUS: PHOSPHORUS: 3.7 mg/dL (ref 2.5–4.6)

## 2017-11-18 MED ORDER — METOPROLOL TARTRATE 5 MG/5ML IV SOLN: 5.0000 mg | Freq: Four times a day (QID) | INTRAVENOUS | Status: DC | PRN

## 2017-11-18 MED ORDER — FAT EMULSION PLANT BASED 20 % IV EMUL
240.0000 mL | INTRAVENOUS | Status: AC
  Administered 2017-11-18: 240 mL via INTRAVENOUS
  Filled 2017-11-18: qty 250

## 2017-11-18 MED ORDER — POTASSIUM CHLORIDE 10 MEQ/50ML IV SOLN
10.0000 meq | INTRAVENOUS | Status: AC
  Administered 2017-11-18 (×6): 10 meq via INTRAVENOUS
  Filled 2017-11-18 (×6): qty 50

## 2017-11-18 MED ORDER — HYDROCORTISONE 1 % EX CREA: 1.0000 "application " | TOPICAL_CREAM | Freq: Three times a day (TID) | CUTANEOUS | Status: DC | PRN

## 2017-11-18 MED ORDER — HYDRALAZINE HCL 20 MG/ML IJ SOLN: 10.0000 mg | INTRAMUSCULAR | Status: DC | PRN

## 2017-11-18 MED ORDER — CLINIMIX E/DEXTROSE (5/15) 5 % IV SOLN
INTRAVENOUS | Status: AC
  Administered 2017-11-18: 17:00:00 via INTRAVENOUS
  Filled 2017-11-18: qty 1000

## 2017-11-18 NOTE — Progress Notes (Signed)
10 Days Post-Op   Subjective/Chief Complaint: No complaints. Feels better. Having ostomy output   Objective: Vital signs in last 24 hours: Temp:  [97.7 F (36.5 C)-98.2 F (36.8 C)] 98.2 F (36.8 C) (06/08 0652) Pulse Rate:  [107-115] 115 (06/08 0652) Resp:  [16] 16 (06/08 0652) BP: (98-125)/(59-70) 98/62 (06/08 0652) SpO2:  [96 %-100 %] 100 % (06/08 0652) Weight:  [56.6 kg (124 lb 12.5 oz)] 56.6 kg (124 lb 12.5 oz) (06/08 0500) Last BM Date: 11/17/17  Intake/Output from previous day: 06/07 0701 - 06/08 0700 In: 3078.7 [P.O.:840; I.V.:1838.7; IV Piggyback:400] Out: 4854 [Urine:3400; Stool:2] Intake/Output this shift: No intake/output data recorded.  General appearance: alert and cooperative Resp: clear to auscultation bilaterally Cardio: regular rate and rhythm GI: soft, mild tenderness. distended. good bs. ostomy pink and productive  Lab Results:  Recent Labs    11/17/17 0909 11/18/17 0413  WBC 19.1* 18.9*  HGB 10.0* 10.0*  HCT 28.5* 28.5*  PLT 307 401*   BMET Recent Labs    11/17/17 0352 11/18/17 0413  NA 125* 129*  K 3.5 3.2*  CL 90* 90*  CO2 25 28  GLUCOSE 100* 106*  BUN 26* 29*  CREATININE 0.52 0.67  CALCIUM 8.1* 8.6*   PT/INR No results for input(s): LABPROT, INR in the last 72 hours. ABG No results for input(s): PHART, HCO3 in the last 72 hours.  Invalid input(s): PCO2, PO2  Studies/Results: Ct Abdomen Pelvis W Contrast  Result Date: 11/17/2017 CLINICAL DATA:  Leukocytosis. Recent partial colectomy, small-bowel resection and colostomy. EXAM: CT ABDOMEN AND PELVIS WITH CONTRAST TECHNIQUE: Multidetector CT imaging of the abdomen and pelvis was performed using the standard protocol following bolus administration of intravenous contrast. CONTRAST:  15mL ISOVUE-300 IOPAMIDOL (ISOVUE-300) INJECTION 61% COMPARISON:  Preoperative noncontrast abdominal CT 11/03/2017 FINDINGS: Lower chest: Moderate bilateral pleural effusions and adjacent airspace  disease, on the right this represents compressive atelectasis, on the left may be atelectasis or pneumonia. The heart is normal in size. Prior left mastectomy. Hepatobiliary: No focal hepatic lesion. Gallbladder partially distended, no calcified gallstone. No biliary dilatation. Pancreas: No ductal dilatation or inflammation. Spleen: Normal in size without focal abnormality. Adrenals/Urinary Tract: No adrenal nodule. Prior left nephrectomy. Moderate right hydronephrosis and proximal hydroureter, not significantly changed from prior exams. More distal ureter is nondistended. Urinary bladder is partially distended. Posterior bladder wall thickening more prominent on the left. Stomach/Bowel: Gastrostomy tube in the stomach. Prior small bowel dilatation has resolved. Enteric sutures noted in small bowel in the lower pelvis. No bowel obstruction with enteric contrast reaching the transverse colon and left mid abdominal colostomy. Stabled off transverse and descending colon are decompressed. Irregular enhancing rectal mass extending into the perirectal soft tissues and uterus, direct comparison difficult given lack of enhancement on prior exam. No peristomal hernia. Vascular/Lymphatic: Abdominal aorta is normal in caliber. Portal, splenic, and mesenteric veins are patent. No acute vascular findings. No definite enlarged abdominal or pelvic lymph nodes. Small retroperitoneal nodes are similar to prior exam. Reproductive: Rectal mass involves the uterus which is heterogeneous, enlarged, with ill-defined borders inferiorly. Other: Moderate volume intra-abdominal ascites which measures simple fluid density, however slight peritoneal enhancement is noted. Loculated pelvic fluid appears similar to prior exams. Generalized body wall edema and skin thickening. No free air in the abdomen. Midline skin staples noted. Musculoskeletal: Possible vague lucent lesion within L1 and L3 vertebral bodies, not definitively seen on prior  exams. No sclerotic lesions. IMPRESSION: 1. Recent small bowel resection and transverse colostomy. Moderate  volume of intra-abdominal ascites with slight peritoneal enhancement, but no well-defined abscess. No free air. 2. Moderate bilateral pleural effusions and bibasilar airspace disease. On the right this is consistent with compressive atelectasis, on the left may be atelectasis or pneumonia in the setting of leukocytosis. Whole body wall edema and anasarca. 3. Enhancing rectal mass invading the uterus and possibly posterior wall of the bladder. 4. Stable right hydronephrosis and proximal hydroureter. 5. Possible lucent lesions within L1 and L3 vertebral bodies out or not seen on 07/10/2017 CT, and may represent osseous metastatic disease. This could be further assessed with nonemergent bone scan or PET-CT. Electronically Signed   By: Jeb Levering M.D.   On: 11/17/2017 00:19   US Paracentesis  Result Date: 11/17/2017 INDICATION: Patient with history of stage IV lobular breast cancer, recent small bowel resection/transverse colostomy, ascites, bilateral pleural effusions, rectal mass. Request made for diagnostic and therapeutic paracentesis. EXAM: ULTRASOUND GUIDED DIAGNOSTIC AND THERAPEUTIC PARACENTESIS MEDICATIONS: None COMPLICATIONS: None immediate. PROCEDURE: Informed written consent was obtained from the patient after a discussion of the risks, benefits and alternatives to treatment. A timeout was performed prior to the initiation of the procedure. Initial ultrasound scanning demonstrates a small to moderate amount of loculated ascites within the right lower abdominal quadrant. The right lower abdomen was prepped and draped in the usual sterile fashion. 1% lidocaine was used for local anesthesia. Following this, a 6 Fr Safe-T-Centesis catheter was introduced. An ultrasound image was saved for documentation purposes. The paracentesis was performed. The catheter was removed and a dressing was applied.  The patient tolerated the procedure well without immediate post procedural complication. FINDINGS: A total of approximately 570 cc of hazy, amber fluid was removed. Samples were sent to the laboratory as requested by the clinical team. Due to the multiloculated nature of the ascites only the above amount of fluid could be removed today. IMPRESSION: Successful ultrasound-guided diagnostic and therapeutic paracentesis yielding 570 cc of peritoneal fluid. Read by: Rowe Robert, PA-C Electronically Signed   By: Sandi Mariscal M.D.   On: 11/17/2017 16:31   Dg Chest Port 1 View  Result Date: 11/16/2017 CLINICAL DATA:  Leukocytosis. EXAM: PORTABLE CHEST 1 VIEW COMPARISON:  Chest radiograph 11/11/2004, lung bases from abdominal CT 11/03/2017 FINDINGS: Left pleural effusion and basilar opacity, unchanged from prior exam. Small right pleural effusion has improved from prior exam. Mild residual right basilar atelectasis. Heart is normal in size. No new airspace disease. Tip of the right upper extremity PICC in the SVC. Left mastectomy with surgical clips in the chest wall. IMPRESSION: Left pleural effusion and basilar opacity are unchanged from chest radiograph 5 days ago. Basilar opacity likely secondary to atelectasis, sterility technically indeterminate by radiograph. Improved right pleural effusion and atelectasis from prior exam. No new airspace disease. Electronically Signed   By: Jeb Levering M.D.   On: 11/16/2017 20:42    Anti-infectives: Anti-infectives (From admission, onward)   Start     Dose/Rate Route Frequency Ordered Stop   11/17/17 1030  piperacillin-tazobactam (ZOSYN) IVPB 3.375 g     3.375 g 12.5 mL/hr over 240 Minutes Intravenous Every 8 hours 11/17/17 1016     11/09/17 0000  cefoTEtan (CEFOTAN) 2 g in sodium chloride 0.9 % 100 mL IVPB     2 g 200 mL/hr over 30 Minutes Intravenous Every 12 hours 11/08/17 1748 11/09/17 0021   11/08/17 1021  sodium chloride 0.9 % with cefoTEtan (CEFOTAN) ADS  Med    Note to Pharmacy:  Maida Sale,  Pamela   : cabinet override      11/08/17 1021 11/08/17 2229   11/08/17 0600  cefoTEtan (CEFOTAN) 2 g in sodium chloride 0.9 % 100 mL IVPB  Status:  Discontinued    Note to Pharmacy:  Pharmacy may adjust dose strength for optimal dosing.   Send with patient on call to the OR.  Anesthesia to complete antibiotic administration <37min prior to incision per [facility].   2 g 200 mL/hr over 30 Minutes Intravenous On call to O.R. 11/07/17 1234 11/07/17 1433   11/08/17 0600  cefoTEtan in Dextrose 5% (CEFOTAN) IVPB 2 g     2 g Intravenous On call to O.R. 11/07/17 1505 11/08/17 1207   11/07/17 1500  cefoTEtan in Dextrose 5% (CEFOTAN) IVPB 2 g  Status:  Discontinued     2 g Intravenous On call to O.R. 11/07/17 1433 11/07/17 1505      Assessment/Plan: s/p Procedure(s): LAPAROSCOPY DIAGNOSTIC (N/A) SMALL BOWEL RESECTION (N/A) INSERTION OF GASTROSTOMY TUBE (N/A) LYSIS OF ADHESION (N/A) PARTIAL COLECTOMY WITH NEW COLOSTOMY (N/A) Advance diet. She can tolerate liquids g tube clamped Continue dressing changes SBR and partial colectomy with colostomy POD 9 Ambulate Plan for aspiration of pleural fluid today. Paracentesis yesterday. May be difficult to get this fluid to resolve if it is secondary to malignancy Metastatic breast cancer per oncology WBC elevated but no obvious source yet  LOS: 15 days    TOTH III,PAUL S 11/18/2017

## 2017-11-18 NOTE — Progress Notes (Signed)
Pt handed off to nurse, pt went down for thoracentesis per order, given scheduled meds per orders.lm

## 2017-11-18 NOTE — Progress Notes (Signed)
PROGRESS NOTE    Sonia Hill  DTO:[id number] DOB: [birth year] DOA: 11/03/2017 PCP: [name], MD   Brief Narrative:  The patient is a 63 female with metastatic breast cancer who sees Dr. Elza Rafter, Hx of Colostomy + rectal mass 04/2017-complicated by SBO-recurrent SBO 06/2017 and currently on liquid diet at home. She was admitted with severe excruciating abdominal pain no ostomy output. CT Abdomen showed worsening cancer. Underwent Surgery on 5/29 and had Diagnostic Laparoscopy, Lysis of Adhesions, Small Bowel Resection, Insertion of Gastrotomy Tube and Partial Colectomy with new Colostomy. Currently on TPN and diet being advanced to soft Diet and is doing better but WBC started Elevating so will pan-culture. CT of Abdomen and CXR show possible PNA so patient was started on Empiric Zosyn. Patient underwent Thoracentesis and Paracentesis and remains grossly volume overloaded.   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    Ascites   New onset left bundle branch block (LBBB)   Metastatic breast cancer to small intestine causing SBO s/p SB resection 11/08/2017   Metastatic breast cancer to colostomy s/p colectomy/colostomy 11/08/2017   Cachexia ([place])   Malnutrition of moderate degree   Anemia  Malignant bowel obstruction/small bowel obstruction secondary to Breast Cancer Metastasis status post small bowel resection 11/08/2017 -Patient noted to have no significant improvement with small bowel obstruction and subsequently underwent laparoscopic diagnostic  small bowel resection, insertion of gastrostomy tube, lysis of adhesion, partial colectomy with new colostomy per Dr. Johney Maine 11/08/2017.   -NG tube has been discontinued per General Surgery.   -IV Robaxin 500 mg q8hhas been added to patient's regimen.   -Patient noted to have some bowel function.   -Tolerating Soft Diet but also will have TNA cycled.  -TNA per General Surgery  -Pain regimen has been adjusted per General Surgery; Continue current regimen  Hyponatremia, improved  -Initially felt likely secondary to hypervolemic hyponatremia plus or minus SIADH and now likley from massive volume overloaded  -Patient noted to have lower extremity edema as well as low albumin levels.   -Sodium is now 129 and IV Diuresis with 80 mg BID started  -TSH within normal limits.   -Cortisol level within normal limits.  -Nephrology consulted to assessed the patient and feel hyponatremia may be secondary to hypervolemia with anasarca.   -Patient was started on IV albumin and low-dose salt tablets as well as compression stockings but Salt Tablets have been stopped now; Will give more doses of Albumin -Sodium levels currently went from 125 -> 129.  -Transitioned to oral Lasix 40 mg daily once tolerating soft Diet but will continue IV Furosemide 80 mg BID as she is massively volume overloaded  -Nephrology following and appreciate input and recommendations.   Hypokalemia/hypocalcemia/hypomagnesemia -Continue TPN/TNA per General Surgery  -Electrolytes being repleted per pharmacy.    Hypothyroidism  -TSH 3.123.  IV Synthroid and changed to po yesterday -C/w po 100 mcg po Daily .    Metastatic estrogen positive lobular breast cancer -Patient with metastatic spread to the spine and rectal area resulting in blockage and diverging colostomy.   -Oncology following and feel  as if she is turning the corner -Dr. [name] recommending resuming Exemestane at this point and continue to hold Abemaciclib   Solitary  kidney with Hydronephrosis -Status post Foley catheter placement.   -Renal function stable. Urology consulted and no further work-up needed during this hospitalization. -Outpatient follow-up with Urology.    Anxiety -Ativan as needed.  Severe protein calorie malnutrition -On TPN per General Surgery  -Nutrition is consulted and appreciate further evaluation recommendations -Continue Ensure Enlive 237 mL's p.o. 3 times daily with meals  Volume Overload/Anasarca -Patient with lower extremity edema, abdominal swelling and anasarca  -Likely component of hypoalbuminemia. -Resumed IV Lasix at 80 mg BID and will hold po doses  -Urine output not accurately recorded.   -Continue to MonitorStrict I's and O's.  Daily weights. -Patient is +18.079 Liters since Admission and Weight is now 124 (up from 108 on Admission) -Ordered Thoracentesis and Paracentesis   New left bundle branch block -Noted on EKG the night of 11/08/2017.   -Patient denies any chest pain no shortness of breath.   -2D echo with a EF of 65 to 70%, no wall motion abnormalities, left ventricular diastolic function normal.   -Patient has been seen in consultation by Cardiology and no further ischemic work-up recommended at this time.   Singultus/Hiccups, improved -Likely secondary to malignant bowel obstruction.   -Patient on Reglan which has been placed 3 times daily at a lower dose per oncology but now Stopped.  -Patient given a dose of Thorazine however was having's some side effects from it and as such Thorazine has been discontinued. -Patient given Cogentin 1 mg IV every 12 hours x2 doses.   -Clinical improvement with hiccups noted  Normocytic Anemia -Patient is status post 2 units packed red blood cells 11/12/2017. -Patient denies any overt bleeding.   -Colostomy with small brown stool.  FOBT was positive.  Anemia panel has been ordered and looks like patient does have a severe iron deficiency anemia with iron level less  than 5.   -IV Feraheme x1.  LDH, haptoglobin, hepatic panel not consistent with hemolysis.  CT abdomen and pelvis was not done.   -Patient was seen in consultation by Gastroenterology who are recommending serial CBCs.  Transfuse as needed.   -Hb/Hct stable at 10.0/28.5 -Continue to Monitor for S/Sx of Bleeding -Repeat CBC in AM  Leukocytosis likely in the setting of PNA and SBP -Worsening -WBC went from 9.6 -> 14.2 -> 17.3 -> 19.1 -> 18.9 -Urinalysis Rechecked along with Urine Culture and Urinalysis Negative  -Last Urine Cx on 6/1 showed 30,000 CFU of Pseudomonas Aeruginosa; Repeat Cx showed Multiple Species Present  -Not on Abx but will start on Empiric Zosyn given possible Pneumonia noted on CT Scan of Abdomen and CXR -Repeat Blood Cultures showed NGTD at 1 day -CXR showed Left pleural effusion and basilar opacity are unchanged from chest radiograph 5 days ago. Basilar opacity likely secondary to atelectasis, sterility technically indeterminate by radiograph. Improved right pleural effusion and atelectasis from prior exam. Nonew airspace disease. -Checked CT Abdomen and Pelvis showed Recent small bowel resection and transverse colostomy. Moderate volume of intra-abdominal ascites with slight peritoneal enhancement, but no well-defined abscess. No free air. Moderate bilateral pleural effusions and bibasilar airspace disease. On the right this is consistent with compressive atelectasis, on the left may be atelectasis or pneumonia in the setting of leukocytosis. Whole body wall edema and anasarca. Enhancing rectal mass invading the uterus and possibly posterior wall of the bladder. Stable right hydronephrosis and  proximal hydroureter. Possible lucent lesions within L1 and L3 vertebral bodies out or not seen on 07/10/2017 CT, and may represent osseous metastatic Disease -Ordered Thoracentesis and Paracentesis -Paracentesis done and showed Cloudy Appearance with 2837 WBC's and 82 Neutrophils with other  Cx studies pending  -Right Thoracentesis done today  -Repeat CBC in AM   Abnormal LFT's/Elevated Alk Phos,improving  -AST was 108, ALT was 108, and Alk Phos was 471; Now improved and AST is 31, AL:T is 36 and Alk Phos is 323 -Likely from TNA/TPN -Continue to Monitor and Repeat CMP in AM   Hypokalemia -Patient's potassium level is 3.2 -Replete -Continue to monitor and replete as necessary -Repeat CMP in a.m.  DVT prophylaxis: SCDs Code Status: FULL CODE Family Communication: No family present at bedside Disposition Plan: Anticipate D/C Home in next 1-2 days if medically stable  Consultants:   General Surgery: [name] 11/04/2017  Palliative care: Dr. Rowe Pavy 10/30/2017  Cardiology: Dr.Skains 11/10/2017  Nephrology: Dr: Jonnie Finner 11/11/2017  Gastroenterology: Dr. Cristina Gong 11/12/2017  Urology: Dr. Alinda Money 11/13/2017  Pharmacy for TNA  Procedures: S/P diagnostic laparoscopy, LOA, SBR, insertion gastrostomy tube, partial colectomy new colostomy 11/08/17 Dr. Johney Maine  Throacentesis Right - Drained 230 mL  Paracentesis   Antimicrobials:  Anti-infectives (From admission, onward)   Start     Dose/Rate Route Frequency Ordered Stop   11/17/17 1030  piperacillin-tazobactam (ZOSYN) IVPB 3.375 g     3.375 g 12.5 mL/hr over 240 Minutes Intravenous Every 8 hours 11/17/17 1016     11/09/17 0000  cefoTEtan (CEFOTAN) 2 g in sodium chloride 0.9 % 100 mL IVPB     2 g 200 mL/hr over 30 Minutes Intravenous Every 12 hours 11/08/17 1748 11/09/17 0021   11/08/17 1021  sodium chloride 0.9 % with cefoTEtan (CEFOTAN) ADS Med    Note to Pharmacy:  Marchia Meiers   : cabinet override      11/08/17 1021 11/08/17 2229   11/08/17 0600  cefoTEtan (CEFOTAN) 2 g in sodium chloride 0.9 % 100 mL IVPB  Status:  Discontinued    Note to Pharmacy:  Pharmacy may adjust dose strength for optimal dosing.   Send with patient on call to the OR.  Anesthesia to complete antibiotic administration <18mn [facility]   2 g 200 mL/hr over 30 Minutes Intravenous On call to O.R. 11/07/17 1234 11/07/17 1433   11/08/17 0600  cefoTEtan in Dextrose 5% (CEFOTAN) IVPB 2 g     2 g Intravenous On call to O.R. 11/07/17 1505 11/08/17 1207   11/07/17 1500  cefoTEtan in Dextrose 5% (CEFOTAN) IVPB 2 g  Status:  Discontinued     2 g Intravenous On call to O.R. 11/07/17 1433 11/07/17 1505     Subjective: Seen and examined and felt ok.  Thinks leg swelling is minimally better.  No chest pain, or shortness breath, nausea or vomiting.  Denies any complaints or concerns at this time  Objective: Vitals:   11/17/17 1618 11/17/17 2133 11/18/17 0500 11/18/17 0652  BP: 114/68 113/64  98/62  Pulse:  (!) 110  (!) 115  Resp:  16  16  Temp:  97.8 F (36.6 C)  98.2 F (36.8 C)  TempSrc:  Oral  Oral  SpO2:  96%  100%  Weight:   56.6 kg (124 lb 12.5 oz)   Height:        Intake/Output Summary (Last 24 hours) at 11/18/2017 0828 Last data filed at 11/18/2017 07989Gross per  24 hour  Intake 3078.67 ml  Output 3402 ml  Net -323.33 ml   Filed Weights   11/16/17 0625 11/17/17 0700 11/18/17 0500  Weight: 55.6 kg (122 lb 9.2 oz) 58.1 kg (128 lb) 56.6 kg (124 lb 12.5 oz)   Examination: Physical Exam:  Constitutional: Thin Caucasian female is currently in no acute distress and appears calm Eyes: Conjunctive and lids are normal.  Sclera anicteric ENMT: External ears and nose appear normal.  Grossly normal hearing Neck: Supple with no JVD Respiratory: Diminished to auscultation bilaterally with some mild crackles.  No appreciable wheezing, rales, rhonchi.  Unlabored breathing Cardiovascular: Tachycardic rate but regular rhythm.  No appreciable murmurs, rubs, gallops. Abdomen: Deferred soft, tender slightly.  Distended with abdominal wall edema.  Bowel sounds present has a colostomy bag with brown liquid stool GU:  Musculoskeletal: Dr. cyanosis.  No joint deformities noted.  PICC line is in place in the left arm.   Massively volume overloaded Skin: Skin is warm and dry with no appreciable rashes or lesions on limited skin evaluation.  Kniffen cannot lower extremity swelling abdominal swelling and is instructed Neurologic: Cranial nerves II through XII grossly intact with no appreciable focal deficits Psychiatric: Normal appearing mood and affect.  Intact judgment and insight.  Is awake and alert and oriented x3  Data Reviewed: I have personally reviewed following labs and imaging studies  CBC: Recent Labs  Lab 11/14/17 1000 11/15/17 0445 11/16/17 0416 11/16/17 1637 11/17/17 0909 11/18/17 0413  WBC 8.7 9.6 14.2* 17.3* 19.1* 18.9*  NEUTROABS 7.4  --  12.1* 15.2* 17.2* 17.1*  HGB 11.4* 10.5* 10.7* 10.8* 10.0* 10.0*  HCT 32.3* 29.6* 30.8* 30.6* 28.5* 28.5*  MCV 82.0 81.3 83.9 81.4 83.8 82.6  PLT 164 191 265 290 307 007*   Basic Metabolic Panel: Recent Labs  Lab 11/13/17 0648 11/14/17 0431 11/15/17 0445 11/16/17 0416 11/17/17 0352 11/18/17 0413  NA 130* 129* 128* 128* 125* 129*  K 3.0* 3.6 3.5 3.5 3.5 3.2*  CL 96* 95* 95* 93* 90* 90*  CO2 _0 GLUCOSE 99 99 109* 99 100* 106*  BUN 27* 24* 25* 26* 26* 29*  CREATININE 0.64 0.53 0.51 0.58 0.52 0.67  CALCIUM 9.4 9.2 8.8* 8.7* 8.1* 8.6*  MG 1.8 2.0 1.9 1.8 1.7 2.1  PHOS 3.1  --  3.4 3.6 3.6 3.7   GFR: Estimated Creatinine Clearance: 57.4 mL/min (by C-G formula based on SCr of 0.67 mg/dL). Liver Function Tests: Recent Labs  Lab 11/12/17 1353 11/13/17 0648 11/16/17 0416 11/17/17 0352 11/18/17 0413  AST 36 52* 108* 37 31  ALT 11* 21 108* 53 36  ALKPHOS 79 146* 471* 363* 323*  BILITOT 0.8 1.5* 1.1 1.0 1.3*  PROT 5.2* 5.5* 5.1* 4.7* 5.7*  ALBUMIN 3.7 3.8 2.5* 2.1* 3.0*   No results for input(s): LIPASE, AMYLASE in the last 168 hours. No results for input(s): AMMONIA in the last 168 hours. Coagulation Profile: No results for input(s): INR, PROTIME in the last 168 hours. Cardiac Enzymes: No results for input(s):  CKTOTAL, CKMB, CKMBINDEX, TROPONINI in the last 168 hours. BNP (last 3 results) No results for input(s): PROBNP in the last 8760 hours. HbA1C: No results for input(s): HGBA1C in the last 72 hours. CBG: Recent Labs  Lab 11/12/17 1205 11/12/17 1747 11/12/17 2344 11/13/17 0634 11/13/17 1128  GLUCAP 88 81 105* 91 90   Lipid Profile: No results for input(s): CHOL, HDL, LDLCALC, TRIG, CHOLHDL, LDLDIRECT in the last  72 hours. Thyroid Function Tests: No results for input(s): TSH, T4TOTAL, FREET4, T3FREE, THYROIDAB in the last 72 hours. Anemia Panel: No results for input(s): VITAMINB12, FOLATE, FERRITIN, TIBC, IRON, RETICCTPCT in the last 72 hours. Sepsis Labs: No results for input(s): PROCALCITON, LATICACIDVEN in the last 168 hours.  Recent Results (from the past 240 hour(s))  Culture, Urine     Status: Abnormal   Collection Time: 11/08/17  9:54 AM  Result Value Ref Range Status   Specimen Description   Final    URINE, CLEAN CATCH Performed at [facility], [place] [address] [place], [place] [address]    Special Requests   Final    NONE Performed at [facility], [place] [address] [place], [place] [address]    Culture (A)  Final    <10,000 COLONIES/mL INSIGNIFICANT GROWTH Performed at [place] [address] [place], [place] [address]    Report Status 11/09/2017 FINAL  Final  Culture, Urine     Status: Abnormal   Collection Time: 11/11/17  8:18 AM  Result Value Ref Range Status   Specimen Description   Final    URINE, RANDOM Performed at [place] [address] [place], [place] [address]    Special Requests   Final    NONE Performed at [facility], [place] [address], [place], [place] [address]    Culture 30,000 COLONIES/mL PSEUDOMONAS AERUGINOSA (A)  Final   Report Status 11/13/2017 FINAL  Final   Organism ID, Bacteria PSEUDOMONAS AERUGINOSA (A)  Final      Susceptibility    Pseudomonas aeruginosa - MIC*    CEFTAZIDIME <=1 SENSITIVE Sensitive     CIPROFLOXACIN <=0.25 SENSITIVE Sensitive     GENTAMICIN <=1 SENSITIVE Sensitive     IMIPENEM 1 SENSITIVE Sensitive     PIP/TAZO <=4 SENSITIVE Sensitive     CEFEPIME <=1 SENSITIVE Sensitive     * 30,000 COLONIES/mL PSEUDOMONAS AERUGINOSA  Culture, Urine     Status: Abnormal   Collection Time: 11/16/17 10:55 AM  Result Value Ref Range Status   Specimen Description   Final    URINE, CLEAN CATCH Performed at [facility], [place] [address], [place], [place] [address]    Special Requests   Final    NONE Performed at [facility], [place] [address], [place], [place] [address]    Culture MULTIPLE SPECIES PRESENT, SUGGEST RECOLLECTION (A)  Final   Report Status 11/17/2017 FINAL  Final  Gram stain     Status: None   Collection Time: 11/17/17  5:13 PM  Result Value Ref Range Status   Specimen Description PERITONEAL  Final   Special Requests NONE  Final   Gram Stain   Final    FEW WBC PRESENT, PREDOMINANTLY PMN NO ORGANISMS SEEN Performed at [facility], [place] [address] [place], [place] [address]    Report Status 11/17/2017 FINAL  Final    Radiology Studies: Ct Abdomen Pelvis W Contrast  Result Date: 11/17/2017 CLINICAL DATA:  Leukocytosis. Recent partial colectomy, small-bowel resection and colostomy. EXAM: CT ABDOMEN AND PELVIS WITH CONTRAST TECHNIQUE: Multidetector CT imaging of the abdomen and pelvis was performed using the standard protocol following bolus administration of intravenous contrast. CONTRAST:  122m ISOVUE-300 IOPAMIDOL (ISOVUE-300) INJECTION 61% COMPARISON:  Preoperative noncontrast abdominal CT 11/03/2017 FINDINGS: Lower chest: Moderate bilateral pleural effusions and adjacent airspace disease, on the right this represents compressive atelectasis, on the left may be atelectasis  or pneumonia. The heart is normal in size. Prior left mastectomy. Hepatobiliary: No  focal hepatic lesion. Gallbladder partially distended, no calcified gallstone. No biliary dilatation. Pancreas: No ductal dilatation or inflammation. Spleen: Normal in size without focal abnormality. Adrenals/Urinary Tract: No adrenal nodule. Prior left nephrectomy. Moderate right hydronephrosis and proximal hydroureter, not significantly changed from prior exams. More distal ureter is nondistended. Urinary bladder is partially distended. Posterior bladder wall thickening more prominent on the left. Stomach/Bowel: Gastrostomy tube in the stomach. Prior small bowel dilatation has resolved. Enteric sutures noted in small bowel in the lower pelvis. No bowel obstruction with enteric contrast reaching the transverse colon and left mid abdominal colostomy. Stabled off transverse and descending colon are decompressed. Irregular enhancing rectal mass extending into the perirectal soft tissues and uterus, direct comparison difficult given lack of enhancement on prior exam. No peristomal hernia. Vascular/Lymphatic: Abdominal aorta is normal in caliber. Portal, splenic, and mesenteric veins are patent. No acute vascular findings. No definite enlarged abdominal or pelvic lymph nodes. Small retroperitoneal nodes are similar to prior exam. Reproductive: Rectal mass involves the uterus which is heterogeneous, enlarged, with ill-defined borders inferiorly. Other: Moderate volume intra-abdominal ascites which measures simple fluid density, however slight peritoneal enhancement is noted. Loculated pelvic fluid appears similar to prior exams. Generalized body wall edema and skin thickening. No free air in the abdomen. Midline skin staples noted. Musculoskeletal: Possible vague lucent lesion within L1 and L3 vertebral bodies, not definitively seen on prior exams. No sclerotic lesions. IMPRESSION: 1. Recent small bowel resection and transverse colostomy. Moderate volume of intra-abdominal ascites with slight peritoneal enhancement, but  no well-defined abscess. No free air. 2. Moderate bilateral pleural effusions and bibasilar airspace disease. On the right this is consistent with compressive atelectasis, on the left may be atelectasis or pneumonia in the setting of leukocytosis. Whole body wall edema and anasarca. 3. Enhancing rectal mass invading the uterus and possibly posterior wall of the bladder. 4. Stable right hydronephrosis and proximal hydroureter. 5. Possible lucent lesions within L1 and L3 vertebral bodies out or not seen on 07/10/2017 CT, and may represent osseous metastatic disease. This could be further assessed with nonemergent bone scan or PET-CT. Electronically Signed   By: Jeb Levering M.D.   On: 11/17/2017 00:19   US Paracentesis  Result Date: 11/17/2017 INDICATION: Patient with history of stage IV lobular breast cancer, recent small bowel resection/transverse colostomy, ascites, bilateral pleural effusions, rectal mass. Request made for diagnostic and therapeutic paracentesis. EXAM: ULTRASOUND GUIDED DIAGNOSTIC AND THERAPEUTIC PARACENTESIS MEDICATIONS: None COMPLICATIONS: None immediate. PROCEDURE: Informed written consent was obtained from the patient after a discussion of the risks, benefits and alternatives to treatment. A timeout was performed prior to the initiation of the procedure. Initial ultrasound scanning demonstrates a small to moderate amount of loculated ascites within the right lower abdominal quadrant. The right lower abdomen was prepped and draped in the usual sterile fashion. 1% lidocaine was used for local anesthesia. Following this, a 6 Fr Safe-T-Centesis catheter was introduced. An ultrasound image was saved for documentation purposes. The paracentesis was performed. The catheter was removed and a dressing was applied. The patient tolerated the procedure well without immediate post procedural complication. FINDINGS: A total of approximately 570 cc of hazy, amber fluid was removed. Samples were sent  to the laboratory as requested by the clinical team. Due to the multiloculated nature of the ascites only the above amount of fluid could be removed today. IMPRESSION: Successful ultrasound-guided diagnostic and therapeutic paracentesis yielding 570  cc of peritoneal fluid. Read by: Rowe Robert, PA-C Electronically Signed   By: Sandi Mariscal M.D.   On: 11/17/2017 16:31   Dg Chest Port 1 View  Result Date: 11/16/2017 CLINICAL DATA:  Leukocytosis. EXAM: PORTABLE CHEST 1 VIEW COMPARISON:  Chest radiograph 11/11/2004, lung bases from abdominal CT 11/03/2017 FINDINGS: Left pleural effusion and basilar opacity, unchanged from prior exam. Small right pleural effusion has improved from prior exam. Mild residual right basilar atelectasis. Heart is normal in size. No new airspace disease. Tip of the right upper extremity PICC in the SVC. Left mastectomy with surgical clips in the chest wall. IMPRESSION: Left pleural effusion and basilar opacity are unchanged from chest radiograph 5 days ago. Basilar opacity likely secondary to atelectasis, sterility technically indeterminate by radiograph. Improved right pleural effusion and atelectasis from prior exam. No new airspace disease. Electronically Signed   By: Jeb Levering M.D.   On: 11/16/2017 20:42    Scheduled Meds: . acetaminophen  1,000 mg Oral Q8H  . exemestane  25 mg Oral QPC breakfast  . famotidine  20 mg Oral BID  . feeding supplement (ENSURE ENLIVE)  237 mL Oral TID WC  . feeding supplement (PRO-STAT SUGAR FREE 64)  30 mL Oral BID  . furosemide  80 mg Intravenous Q12H  . gabapentin  300 mg Oral TID  . levothyroxine  100 mcg Oral QAC breakfast  . lip balm  1 application Topical BID  . LORazepam  0.5 mg Intravenous Q24H  . methocarbamol  500 mg Oral Q8H  . multivitamin with minerals  1 tablet Oral Daily   Continuous Infusions: . albumin human 25 g (11/17/17 2236)  . piperacillin-tazobactam (ZOSYN)  IV Stopped (11/18/17 6648)  . Marland KitchenTPN (CLINIMIX-E)  Adult 64 mL/hr at 11/17/17 1746    LOS: 15 days   [name], [place] [facility] Pager [phone]  If 7PM-7AM, please contact night-coverage [email or web address] Password [facility] 11/18/2017, 8:28 AM

## 2017-11-18 NOTE — Procedures (Signed)
Met breast ca  S/p RT THORA 230cc removed No comp Stable Labs sent cxr pending

## 2017-11-18 NOTE — Progress Notes (Addendum)
PHARMACY - ADULT TOTAL PARENTERAL NUTRITION CONSULT NOTE   Pharmacy Consult for TPN Indication: Intolerance to enteral feeding   Patient Measurements: Height: 5' (152.4 cm) Weight: 124 lb 12.5 oz (56.6 kg) IBW/kg (Calculated) : 45.5 TPN AdjBW (KG): 49 Body mass index is 24.37 kg/m.   Insulin Requirements: none, DC insulin on 6/3  Current Nutrition: GT clamped 6/3 - Per MD notes, Eating small amounts of soft foods at each meal. - Pt reports she eats small bites of a variety of foods TID + Orgain Protein shakes (2-3 / day) - Add Prostat BID (provides 15 g protein, 100 kcals) added 6/7  11/17/17:  48 hour calorie count in process  IVF: none ordered  Central access: PICC 5/25 TPN start date: 5/28  ASSESSMENT                                                                                                          HPI:  Patient has metastatic breast cancer and partial small bowel obstruction on full liquids for 4 months, now intolerance to clear liquids.  Significant events:  5/29 surgery 5/31 advanced to CLD but did not tolerate 6/3 GT clamped, FLD ordered 6/5 advanced to soft diet 6/6 request to cycle TPN as pt to go home with TPN 6/7 Reduce rate of TPN from goal rate 1.8L to 1L since pt is eating and to minimize volume.  Paracentesis completed, 570 mL out. 6/8 Thoracentesis ordered.  Today:   Glucose - at goal of <150 (during peak cyclic TPN infusion)   Electrolytes - Na low but improved to 129, K low at 3.2.  Mag, Phos WNL.   HypoN:  see nephrologist note (6/3, Hypervolemic hyponatremia) IV albumin & salt tabs d/cd 6/3; restarted IV albumin and increased IV lasix 6/7 for massive anasarca, pitting edema.  Renal - WNL   LFTs - AST/ALT improved to WNL, Tbili and Alk phos elevated  TGs - 152, 157 ( 6/3)  Prealbumin - 10.7 ( 5/29);  <5 (6/3)   NUTRITIONAL GOALS                                                                                             RD recs: 5/30  1700-1800 Kcal and 85-95 gm protein Clinimix E 5/15 at a goal rate of 75 ml/hr + 20% fat emulsion at 83m/hr to provide: 90 g/day protein, 1758 Kcal/day. - Reduced rate:  Clinimix E 5/15 1L / 24 hrs + 20% fat emulsion at 240 ml / 24 hrs to provide: 50 g/day protein, 1190 Kcal/day.   PLAN  Now: KCl 10 mEq IV x 6 runs  At 1800 today:  Continue to cycle Clinimix E 5/15 1L over 16 hours: 50 ml x 1 hour then 75 ml/hr x 12 hours then 80m x 1 hour then stop  20% fat emulsion at 235mhr x 12hours  PO standard multivitamins and trace elements.  BMET, Mag and Phos on Sat/Sun.    TPN lab panels on Mondays & Thursdays.  F/u daily.  ChGretta ArabharmD, BCPS Pager [phone]/01/2018 8:18 AM

## 2017-11-19 ENCOUNTER — Inpatient Hospital Stay (HOSPITAL_COMMUNITY): Payer: 59

## 2017-11-19 DIAGNOSIS — Z515 Encounter for palliative care: Secondary | ICD-10-CM

## 2017-11-19 LAB — COMPREHENSIVE METABOLIC PANEL
ALBUMIN: 3.4 g/dL — AB (ref 3.5–5.0)
ALT: 28 U/L (ref 14–54)
AST: 30 U/L (ref 15–41)
Alkaline Phosphatase: 262 U/L — ABNORMAL HIGH (ref 38–126)
Anion gap: 12 (ref 5–15)
BUN: 34 mg/dL — AB (ref 6–20)
CHLORIDE: 91 mmol/L — AB (ref 101–111)
CO2: 28 mmol/L (ref 22–32)
Calcium: 9 mg/dL (ref 8.9–10.3)
Creatinine, Ser: 0.92 mg/dL (ref 0.44–1.00)
GFR calc Af Amer: >60 mL/min (ref >60)
GFR calc non Af Amer: >60 mL/min (ref >60)
GLUCOSE: 119 mg/dL — AB (ref 65–99)
Potassium: 3.6 mmol/L (ref 3.5–5.1)
Sodium: 131 mmol/L — ABNORMAL LOW (ref 135–145)
Total Bilirubin: 1.3 mg/dL — ABNORMAL HIGH (ref 0.3–1.2)
Total Protein: 6.1 g/dL — ABNORMAL LOW (ref 6.5–8.1)

## 2017-11-19 LAB — GRAM STAIN

## 2017-11-19 LAB — CBC WITH DIFFERENTIAL/PLATELET
BASOS ABS: 0 10*3/uL (ref 0.0–0.1)
BASOS PCT: 0 %
EOS ABS: 0.1 10*3/uL (ref 0.0–0.7)
EOS PCT: 0 %
HCT: 28.4 % — ABNORMAL LOW (ref 36.0–46.0)
Hemoglobin: 9.9 g/dL — ABNORMAL LOW (ref 12.0–15.0)
Lymphocytes Relative: 4 %
Lymphs Abs: 0.7 10*3/uL (ref 0.7–4.0)
MCH: 29.5 pg (ref 26.0–34.0)
MCHC: 34.9 g/dL (ref 30.0–36.0)
MCV: 84.5 fL (ref 78.0–100.0)
MONO ABS: 1 10*3/uL (ref 0.1–1.0)
Monocytes Relative: 5 %
Neutro Abs: 16.6 10*3/uL — ABNORMAL HIGH (ref 1.7–7.7)
Neutrophils Relative %: 91 %
PLATELETS: 549 10*3/uL — AB (ref 150–400)
RBC: 3.36 MIL/uL — ABNORMAL LOW (ref 3.87–5.11)
RDW: 15.2 % (ref 11.5–15.5)
WBC: 18.4 10*3/uL — ABNORMAL HIGH (ref 4.0–10.5)

## 2017-11-19 LAB — GLUCOSE, PLEURAL OR PERITONEAL FLUID: GLUCOSE FL: 85 mg/dL

## 2017-11-19 LAB — ALBUMIN, PLEURAL OR PERITONEAL FLUID: ALBUMIN FL: 1.8 g/dL

## 2017-11-19 LAB — MAGNESIUM: MAGNESIUM: 2.2 mg/dL (ref 1.7–2.4)

## 2017-11-19 LAB — PH, BODY FLUID: pH, Body Fluid: 7.3

## 2017-11-19 LAB — LACTATE DEHYDROGENASE, PLEURAL OR PERITONEAL FLUID: LD FL: 100 U/L — AB (ref 3–23)

## 2017-11-19 LAB — PHOSPHORUS: PHOSPHORUS: 3.9 mg/dL (ref 2.5–4.6)

## 2017-11-19 MED ORDER — POTASSIUM CHLORIDE CRYS ER 20 MEQ PO TBCR
20.0000 meq | EXTENDED_RELEASE_TABLET | Freq: Two times a day (BID) | ORAL | Status: AC
  Administered 2017-11-19 (×2): 20 meq via ORAL
  Filled 2017-11-19: qty 1

## 2017-11-19 MED ORDER — FAT EMULSION PLANT BASED 20 % IV EMUL
240.0000 mL | INTRAVENOUS | Status: AC
  Administered 2017-11-19: 240 mL via INTRAVENOUS
  Filled 2017-11-19: qty 250

## 2017-11-19 MED ORDER — CLINIMIX E/DEXTROSE (5/15) 5 % IV SOLN
INTRAVENOUS | Status: AC
  Administered 2017-11-19: 18:00:00 via INTRAVENOUS
  Filled 2017-11-19: qty 1000

## 2017-11-19 NOTE — Progress Notes (Signed)
PROGRESS NOTE    Sonia Hill  DUK:[id number] DOB: January 16, 1956 DOA: 11/03/2017 PCP: [name], MD   Brief Narrative:  The patient is a 51 female with metastatic breast cancer who sees Dr. Elza Rafter, Hx of Colostomy + rectal mass 04/2017-complicated by SBO-recurrent SBO 06/2017 and currently on liquid diet at home. She was admitted with severe excruciating abdominal pain no ostomy output. CT Abdomen showed worsening cancer. Underwent Surgery on 5/29 and had Diagnostic Laparoscopy, Lysis of Adhesions, Small Bowel Resection, Insertion of Gastrotomy Tube and Partial Colectomy with new Colostomy. Currently on TPN and diet being advanced to soft Diet and is doing better but WBC started Elevating so will pan-culture. CT of Abdomen and CXR show possible PNA so patient was started on Empiric Zosyn. Patient underwent Thoracentesis and Paracentesis and remains grossly volume overloaded but is improved slightly.  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    Ascites   New onset left bundle branch block (LBBB)   Metastatic breast cancer to small intestine causing SBO s/p SB resection 11/08/2017   Metastatic breast cancer to colostomy s/p colectomy/colostomy 11/08/2017   Cachexia ([place])   Malnutrition of moderate degree   Anemia  Malignant bowel obstruction/small bowel obstruction secondary to Breast Cancer Metastasis status post small bowel resection 11/08/2017 -Patient noted to have no significant improvement with small bowel obstruction and subsequently underwent  laparoscopic diagnostic small bowel resection, insertion of gastrostomy tube, lysis of adhesion, partial colectomy with new colostomy per Dr. Johney Maine 11/08/2017.   -NG tube has been discontinued per General Surgery.   -IV Robaxin 500 mg q8hhas been added to patient's regimen.   -Patient noted to have some bowel function.   -Tolerating Soft Diet but also will have TNA cycled.  -TNA per General Surgery  -Pain regimen has been adjusted per General Surgery; Continue current regimen  Hyponatremia, improved  -Initially felt likely secondary to hypervolemic hyponatremia plus or minus SIADH and now likley from massive volume overloaded  -Patient noted to have lower extremity edema as well as low albumin levels.   -Sodium is now 131 and IV Diuresis with 80 mg BID started  -TSH within normal limits.   -Cortisol level within normal limits.  -Nephrology consulted to assessed the patient and feel hyponatremia may be secondary to hypervolemia with anasarca.   -Patient was started on IV albumin and low-dose salt tablets as well as compression stockings but Salt Tablets have been stopped now; Will give more doses of Albumin today and likely stop in AM -Sodium levels currently went from 125 -> 129.  -Transitioned to oral Lasix 40 mg daily once tolerating soft Diet but will continue IV Furosemide 80 mg BID as she is massively volume overloaded  -Nephrology following and appreciate input and recommendations.   Hypokalemia/hypocalcemia/hypomagnesemia -Continue TPN/TNA per General Surgery  -Electrolytes being repleted per pharmacy through TNA.   Hypothyroidism  -TSH 3.123.  IV Synthroid and changed to po yesterday -C/w po 100 mcg po Daily.    Metastatic estrogen positive lobular breast cancer -Patient with metastatic spread to the spine and rectal area resulting in blockage  and diverging colostomy.   -Oncology following and feel as if she is turning the corner -Dr. [name] recommending resuming  Exemestane at this point and continue to hold Abemaciclib   Solitary kidney with Hydronephrosis -Status post Foley catheter placement.   -Renal function stable. Urology consulted and no further work-up needed during this hospitalization. -Outpatient follow-up with Urology.    Anxiety -Ativan as needed.  Severe protein calorie malnutrition -On TPN per General Surgery  -Nutrition is consulted and appreciate further evaluation recommendations -Continue Ensure Enlive 237 mL's p.o. 3 times daily with meals  Volume Overload/Anasarca -Patient with lower extremity edema, abdominal swelling and anasarca  -Likely component of hypoalbuminemia. -Resumed IV Lasix at 80 mg BID and will hold po doses  -Urine output not accurately recorded.   -Continue to MonitorStrict I's and O's.  Daily weights. -Patient is +17.604 Liters since Admission and Weight is now 120 (up from 108 on Admission) -Ordered Thoracentesis and Paracentesis   New left bundle branch block -Noted on EKG the night of 11/08/2017.   -Patient denies any chest pain no shortness of breath.   -2D echo with a EF of 65 to 70%, no wall motion abnormalities, left ventricular diastolic function normal.   -Patient has been seen in consultation by Cardiology and no further ischemic work-up recommended at this time.   Singultus/Hiccups, improved -Likely secondary to malignant bowel obstruction.   -Patient on Reglan which has been placed 3 times daily at a lower dose per oncology but now Stopped.  -Patient given a dose of Thorazine however was having's some side effects from it and as such Thorazine has been discontinued. -Patient given Cogentin 1 mg IV every 12 hours x2 doses.   -Clinical improvement with hiccups noted  Normocytic Anemia -Patient is status post 2 units packed red blood cells 11/12/2017. -Patient denies any overt bleeding.   -Colostomy with small brown stool.  FOBT was positive.  Anemia panel has been ordered and looks like  patient does have a severe iron deficiency anemia with iron level less than 5.   -IV Feraheme x1.  LDH, haptoglobin, hepatic panel not consistent with hemolysis.  CT abdomen and pelvis was not done.   -Patient was seen in consultation by Gastroenterology who are recommending serial CBCs.  Transfuse as needed.   -Hb/Hct stable at 9.9/28.4 -Continue to Monitor for S/Sx of Bleeding -Repeat CBC in AM  Leukocytosis likely in the setting of PNA and SBP -Worsening -WBC went from 9.6 -> 14.2 -> 17.3 -> 19.1 -> 18.9 -> 18.4 -Urinalysis Rechecked along with Urine Culture and Urinalysis Negative  -Last Urine Cx on 6/1 showed 30,000 CFU of Pseudomonas Aeruginosa; Repeat Cx showed Multiple Species Present  -Not on Abx but will start on Empiric Zosyn given possible Pneumonia noted on CT Scan of Abdomen and CXR -Repeat Blood Cultures showed NGTD at 1 day -CXR showed Left pleural effusion and basilar opacity are unchanged from chest radiograph 5 days ago. Basilar opacity likely secondary to atelectasis, sterility technically indeterminate by radiograph. Improved right pleural effusion and atelectasis from prior exam. Nonew airspace disease. -Checked CT Abdomen and Pelvis showed Recent small bowel resection and transverse colostomy. Moderate volume of intra-abdominal ascites with slight peritoneal enhancement, but no well-defined abscess. No free air. Moderate bilateral pleural effusions and bibasilar airspace disease. On the right this is consistent with compressive atelectasis, on the left may be atelectasis or pneumonia in the setting of leukocytosis. Whole body wall edema and anasarca. Enhancing rectal mass invading the uterus  and possibly posterior wall of the bladder. Stable right hydronephrosis and proximal hydroureter. Possible lucent lesions within L1 and L3 vertebral bodies out or not seen on 07/10/2017 CT, and may represent osseous metastatic Disease -Ordered Thoracentesis and Paracentesis -Paracentesis  done and showed Cloudy Appearance with 2837 WBC's and 82 Neutrophils with other Cx studies pending  -Right Thoracentesis done yesterday but Pneumonia and Left Pleural Fluid on the Left  -Repeat CBC in AM   Abnormal LFT's/Elevated Alk Phos,improving  -AST was 108, ALT was 108, and Alk Phos was 471; Now improved and AST is 30, AL:T is 28 and Alk Phos is 262 -Likely from TNA/TPN -Continue to Monitor and Repeat CMP in AM   Hypokalemia, improved -Patient's potassium level is 3.6 this AM -Continue to monitor and replete as necessary -Repeat CMP in a.m.  Hyperbilirubinemia -Mild at 1.3 -Continue to Monitor and Repeat CMP in AM  DVT prophylaxis: SCDs Code Status: FULL CODE Family Communication: No family present at bedside Disposition Plan: Anticipate D/C Home when medically stable  Consultants:   General Surgery: [name] 11/04/2017  Palliative care: Dr. Rowe Pavy 10/30/2017  Cardiology: Dr.Skains 11/10/2017  Nephrology: Dr: Jonnie Finner 11/11/2017  Gastroenterology: Dr. Cristina Gong 11/12/2017  Urology: Dr. Alinda Money 11/13/2017  Pharmacy for TNA  Procedures: S/P diagnostic laparoscopy, LOA, SBR, insertion gastrostomy tube, partial colectomy new colostomy 11/08/17 Dr. Johney Maine  Throacentesis Right - Drained 230 mL  Paracentesis - Removed 570 mL  Antimicrobials:  Anti-infectives (From admission, onward)   Start     Dose/Rate Route Frequency Ordered Stop   11/17/17 1030  piperacillin-tazobactam (ZOSYN) IVPB 3.375 g     3.375 g 12.5 mL/hr over 240 Minutes Intravenous Every 8 hours 11/17/17 1016     11/09/17 0000  cefoTEtan (CEFOTAN) 2 g in sodium chloride 0.9 % 100 mL IVPB     2 g 200 mL/hr over 30 Minutes Intravenous Every 12 hours 11/08/17 1748 11/09/17 0021   11/08/17 1021  sodium chloride 0.9 % with cefoTEtan (CEFOTAN) ADS Med    Note to Pharmacy:  Marchia Meiers   : cabinet override      11/08/17 1021 11/08/17 2229   11/08/17 0600  cefoTEtan (CEFOTAN) 2 g in sodium chloride 0.9 % 100 mL  IVPB  Status:  Discontinued    Note to Pharmacy:  Pharmacy may adjust dose strength for optimal dosing.   Send with patient on call to the OR.  Anesthesia to complete antibiotic administration <[facility]   2 g 200 mL/hr over 30 Minutes Intravenous On call to O.R. 11/07/17 1234 11/07/17 1433   11/08/17 0600  cefoTEtan in Dextrose 5% (CEFOTAN) IVPB 2 g     2 g Intravenous On call to O.R. 11/07/17 1505 11/08/17 1207   11/07/17 1500  cefoTEtan in Dextrose 5% (CEFOTAN) IVPB 2 g  Status:  Discontinued     2 g Intravenous On call to O.R. 11/07/17 1433 11/07/17 1505     Subjective: Seen and examined and felt as if legs were unchanged. Felt as if abdomen swelling was improved. No CP or SOB. No lightheadedness or dizziness. Feeling encouraged.   Objective: Vitals:   11/18/17 1454 11/18/17 2118 11/19/17 0426 11/19/17 1531  BP: (!) 142/64 111/64 127/73 (!) 116/92  Pulse: (!) 108 (!) 108 (!) 114 (!) 110  Resp:  _0 Temp:  98.9 F (37.2 C) 98 F (36.7 C) 98.6 F (37 C)  TempSrc:  Oral Oral Oral  SpO2:  99% 100%  100%  Weight:   54.5 kg (120 lb 2.4 oz)   Height:        Intake/Output Summary (Last 24 hours) at 11/19/2017 1732 Last data filed at 11/19/2017 1400 Gross per 24 hour  Intake 755.5 ml  Output 1175 ml  Net -419.5 ml   Filed Weights   11/17/17 0700 11/18/17 0500 11/19/17 0426  Weight: 58.1 kg (128 lb) 56.6 kg (124 lb 12.5 oz) 54.5 kg (120 lb 2.4 oz)   Examination: Physical Exam:  Constitutional: Thin Caucasian female in NAD appears calm and comfortable Eyes: Sclerae anicteric. Lids normal ENMT: External ears and nose appear normal.  Neck: Supple with no JVD Respiratory: Diminished to auscultation with no appreciable wheezing/rales/rhonchi. Has mild basilar crackles. Cardiovascular: Tachycardic rate but regular rhythm. Massively volume overloaded and 2-3+ LE edema still Abdomen: Soft, Slightly tender to palpate. Distended with abdominal wall  edema with a Colostomy in place. Abdominal scarring noted. Wound VAC removed GU: Deferred Musculoskeletal: No contractures; no cyanosis. No joint deformities; Right PICC in place Skin: Warm and dry; No appreciable rashes or lesions on a limited skin eval Neurologic: CN 2-12 grossly intact. No appreciable focal deficits appreciated Psychiatric: Normal mood and pleasant affect. Intact judgement and insight  Data Reviewed: I have personally reviewed following labs and imaging studies  CBC: Recent Labs  Lab 11/16/17 0416 11/16/17 1637 11/17/17 0909 11/18/17 0413 11/19/17 0519  WBC 14.2* 17.3* 19.1* 18.9* 18.4*  NEUTROABS 12.1* 15.2* 17.2* 17.1* 16.6*  HGB 10.7* 10.8* 10.0* 10.0* 9.9*  HCT 30.8* 30.6* 28.5* 28.5* 28.4*  MCV 83.9 81.4 83.8 82.6 84.5  PLT 265 290 307 401* 592*   Basic Metabolic Panel: Recent Labs  Lab 11/15/17 0445 11/16/17 0416 11/17/17 0352 11/18/17 0413 11/19/17 0519  NA 128* 128* 125* 129* 131*  K 3.5 3.5 3.5 3.2* 3.6  CL 95* 93* 90* 90* 91*  CO2 _0 GLUCOSE 109* 99 100* 106* 119*  BUN 25* 26* 26* 29* 34*  CREATININE 0.51 0.58 0.52 0.67 0.92  CALCIUM 8.8* 8.7* 8.1* 8.6* 9.0  MG 1.9 1.8 1.7 2.1 2.2  PHOS 3.4 3.6 3.6 3.7 3.9   GFR: Estimated Creatinine Clearance: 45.5 mL/min (by C-G formula based on SCr of 0.92 mg/dL). Liver Function Tests: Recent Labs  Lab 11/13/17 0648 11/16/17 0416 11/17/17 0352 11/18/17 0413 11/19/17 0519  AST 52* 108* 37 31 30  ALT 21 108* 53 36 28  ALKPHOS 146* 471* 363* 323* 262*  BILITOT 1.5* 1.1 1.0 1.3* 1.3*  PROT 5.5* 5.1* 4.7* 5.7* 6.1*  ALBUMIN 3.8 2.5* 2.1* 3.0* 3.4*   No results for input(s): LIPASE, AMYLASE in the last 168 hours. No results for input(s): AMMONIA in the last 168 hours. Coagulation Profile: No results for input(s): INR, PROTIME in the last 168 hours. Cardiac Enzymes: No results for input(s): CKTOTAL, CKMB, CKMBINDEX, TROPONINI in the last 168 hours. BNP (last 3 results) No  results for input(s): PROBNP in the last 8760 hours. HbA1C: No results for input(s): HGBA1C in the last 72 hours. CBG: Recent Labs  Lab 11/12/17 1747 11/12/17 2344 11/13/17 0634 11/13/17 1128  GLUCAP 81 105* 91 90   Lipid Profile: No results for input(s): CHOL, HDL, LDLCALC, TRIG, CHOLHDL, LDLDIRECT in the last 72 hours. Thyroid Function Tests: No results for input(s): TSH, T4TOTAL, FREET4, T3FREE, THYROIDAB in the last 72 hours. Anemia Panel: No results for input(s): VITAMINB12, FOLATE, FERRITIN, TIBC, IRON, RETICCTPCT in the last 72 hours. Sepsis Labs: No results  for input(s): PROCALCITON, LATICACIDVEN in the last 168 hours.  Recent Results (from the past 240 hour(s))  Culture, Urine     Status: Abnormal   Collection Time: 11/11/17  8:18 AM  Result Value Ref Range Status   Specimen Description   Final    URINE, RANDOM Performed at [facility] [address], [place], [place] [address]    Special Requests   Final    NONE Performed at [facility], [place] [address] [place], [place] [address]    Culture 30,000 COLONIES/mL PSEUDOMONAS AERUGINOSA (A)  Final   Report Status 11/13/2017 FINAL  Final   Organism ID, Bacteria PSEUDOMONAS AERUGINOSA (A)  Final      Susceptibility   Pseudomonas aeruginosa - MIC*    CEFTAZIDIME <=1 SENSITIVE Sensitive     CIPROFLOXACIN <=0.25 SENSITIVE Sensitive     GENTAMICIN <=1 SENSITIVE Sensitive     IMIPENEM 1 SENSITIVE Sensitive     PIP/TAZO <=4 SENSITIVE Sensitive     CEFEPIME <=1 SENSITIVE Sensitive     * 30,000 COLONIES/mL PSEUDOMONAS AERUGINOSA  Culture, Urine     Status: Abnormal   Collection Time: 11/16/17 10:55 AM  Result Value Ref Range Status   Specimen Description   Final    URINE, CLEAN CATCH Performed at [facility], [place] [address] [place], [place] [address]    Special Requests   Final    NONE Performed at [facility], [place] [address]  [place], [place] [address]    Culture MULTIPLE SPECIES PRESENT, SUGGEST RECOLLECTION (A)  Final   Report Status 11/17/2017 FINAL  Final  Culture, blood (routine x 2)     Status: None (Preliminary result)   Collection Time: 11/16/17  9:02 PM  Result Value Ref Range Status   Specimen Description   Final    BLOOD RIGHT HAND Performed at [facility] [address] [place], [place] [address]    Special Requests   Final    BOTTLES DRAWN AEROBIC AND ANAEROBIC Blood Culture adequate volume Performed at [facility] [address] [place], [place] [address]    Culture   Final    NO GROWTH 2 DAYS Performed at [facility] [address] [place], [place] [address]    Report Status PENDING  Incomplete  Culture, blood (routine x 2)     Status: None (Preliminary result)   Collection Time: 11/17/17 12:18 AM  Result Value Ref Range Status   Specimen Description   Final    BLOOD RIGHT HAND Performed at [facility] [address] [place], [place] [address]    Special Requests   Final    BOTTLES DRAWN AEROBIC AND ANAEROBIC Blood Culture adequate volume Performed at [facility] [address] [place], [place] [address]    Culture   Final    NO GROWTH 2 DAYS Performed at [facility] [address] [place], [place] [address]    Report Status PENDING  Incomplete  Culture, body fluid-bottle     Status: None (Preliminary result)   Collection Time: 11/17/17  5:13 PM  Result Value Ref Range Status   Specimen Description PERITONEAL  Final   Special Requests BOTTLES DRAWN AEROBIC AND ANAEROBIC 10CC  Final   Culture   Final    NO GROWTH 2 DAYS Performed at [facility], [place] [address], [place], [place] [address]    Report Status  PENDING  Incomplete  Gram stain     Status: None   Collection Time: 11/17/17  5:13 PM  Result Value Ref Range Status   Specimen Description PERITONEAL  Final   Special  Requests NONE  Final   Gram Stain   Final    FEW WBC PRESENT, PREDOMINANTLY PMN NO ORGANISMS SEEN Performed at [facility], [address] [place], [place] [address]    Report Status 11/17/2017 FINAL  Final  Gram stain     Status: None   Collection Time: 11/18/17  1:54 PM  Result Value Ref Range Status   Specimen Description FLUID RIGHT PLEURAL  Final   Special Requests NONE  Final   Gram Stain   Final    RARE WBC PRESENT, PREDOMINANTLY MONONUCLEAR NO ORGANISMS SEEN Performed at [facility], [place] [address] [place], [place] [address]    Report Status 11/19/2017 FINAL  Final    Radiology Studies: Dg Chest 1 View  Result Date: 11/18/2017 CLINICAL DATA:  Right thoracentesis EXAM: CHEST  1 VIEW COMPARISON:  11/16/2017 chest radiograph. FINDINGS: Right PICC terminates in lower third of the SVC. Surgical clips overlie the left lower chest. Stable cardiomediastinal silhouette with normal heart size. No pneumothorax. No significant residual right pleural effusion. Stable small left pleural effusion. No pulmonary edema. Persistent patchy left basilar lung opacity, unchanged. IMPRESSION: 1. No right pneumothorax. No significant residual right pleural effusion. 2. Stable small left pleural effusion and patchy left lung base opacity. Electronically Signed   By: Ilona Sorrel M.D.   On: 11/18/2017 16:38   Dg Chest Port 1 View  Result Date: 11/19/2017 CLINICAL DATA:  Dyspnea EXAM: PORTABLE CHEST 1 VIEW COMPARISON:  Chest radiograph from one day prior. FINDINGS: Right PICC terminates in lower third of the SVC. Surgical clips overlie the left chest wall. Stable cardiomediastinal silhouette with normal heart size. No pneumothorax. Small bilateral pleural effusions are probably stable accounting for differences in positioning. No pulmonary edema. Left basilar atelectasis is stable. IMPRESSION: 1. Small bilateral pleural effusions, probably stable accounting for differences in positioning. 2. Left  basilar atelectasis, stable. Electronically Signed   By: Ilona Sorrel M.D.   On: 11/19/2017 08:00   US Thoracentesis Asp Pleural Space W/img Guide  Result Date: 11/18/2017 INDICATION: Pleural effusions, metastatic breast cancer EXAM: ULTRASOUND GUIDED RIGHT THORACENTESIS MEDICATIONS: 1% lidocaine local COMPLICATIONS: None immediate. PROCEDURE: An ultrasound guided thoracentesis was thoroughly discussed with the patient and questions answered. The benefits, risks, alternatives and complications were also discussed. The patient understands and wishes to proceed with the procedure. Written consent was obtained. Ultrasound was performed to localize and mark an adequate pocket of fluid in the right chest. The area was then prepped and draped in the normal sterile fashion. 1% Lidocaine was used for local anesthesia. Under ultrasound guidance a 6 Fr Safe-T-Centesis catheter was introduced. Thoracentesis was performed. The catheter was removed and a dressing applied. FINDINGS: A total of approximately 230 cc of serosanguineous pleural fluid was removed. Samples were sent to the laboratory as requested by the clinical team. IMPRESSION: Successful ultrasound guided right thoracentesis yielding 230 cc of pleural fluid. Electronically Signed   By: Jerilynn Mages.  Shick M.D.   On: 11/18/2017 13:44    Scheduled Meds: . acetaminophen  1,000 mg Oral Q8H  . exemestane  25 mg Oral QPC breakfast  . famotidine  20 mg Oral BID  . feeding supplement (ENSURE ENLIVE)  237 mL Oral TID WC  . feeding supplement (PRO-STAT  SUGAR FREE 64)  30 mL Oral BID  . furosemide  80 mg Intravenous Q12H  . gabapentin  300 mg Oral TID  . levothyroxine  100 mcg Oral QAC breakfast  . lip balm  1 application Topical BID  . LORazepam  0.5 mg Intravenous Q24H  . methocarbamol  500 mg Oral Q8H  . multivitamin with minerals  1 tablet Oral Daily  . potassium chloride  20 mEq Oral BID   Continuous Infusions: . albumin human 25 g (11/19/17 0850)  . Marland KitchenTPN  (CLINIMIX-E) Adult     And  . Fat emulsion    . piperacillin-tazobactam (ZOSYN)  IV Stopped (11/19/17 1417)    LOS: 16 days   [name], DO [facility] Pager [phone]  If 7PM-7AM, please contact night-coverage [email or web address] Password [facility] 11/19/2017, 5:32 PM

## 2017-11-19 NOTE — Progress Notes (Addendum)
Calorie Count Note  48 hour calorie count ordered. Day 1 and Day 2 results below.  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  Goal: Pt to meet >/= 90% of their estimated nutrition needs   Intervention: -continue supplements that patient will accept -Cyclic TPN per Pharmacy  Clayton Bibles, [place], [place], [place] Dietitian Pager: [phone] After Hours Pager: [phone]

## 2017-11-19 NOTE — Progress Notes (Signed)
PMT progress note  Patient is lying in bed.  Reports being sleepy and wanting to get in a nap.  States that she has been pleased overall, and her main concern is how much assistance she will be able to get on discharge.   BP 127/73 (BP Location: Right Leg)   Pulse (!) 114   Temp 98 F (36.7 C) (Oral)   Resp 14   Ht 5' (1.524 m)   Wt 54.5 kg (120 lb 2.4 oz)   SpO2 100%   BMI 23.47 kg/m  Labs and imaging noted.  Notes from past several days reviewed  General: Alert, awake, in no acute distress.  Heart: Regular rate and rhythm. No murmur appreciated. Lungs: Good air movement, clear Ext: No significant edema Skin: Warm and dry Neuro: Grossly intact, nonfocal.  Malignant bowel obstruction Metastatic breast cancer Declining oral intake at home  Provided supportive listening and reviewed care plan.  She is invested in plan to continue with current mode of care.  Appreciates all the support she has been receiving and notes that she will continue to need help troubleshooting discharge concerns as time for discharge approaches.  PMT to continue to visit intermittently for supportive care.  Please call if there are specific areas of need with which we can be of assistance.   20 minutes spent Greater than 50%  of this time was spent counseling and coordinating care related to the above assessment and plan.  Micheline Rough, MD [facility] [phone]

## 2017-11-19 NOTE — Progress Notes (Signed)
PHARMACY - ADULT TOTAL PARENTERAL NUTRITION CONSULT NOTE   Pharmacy Consult for TPN Indication: Intolerance to enteral feeding   Patient Measurements: Height: 5' (152.4 cm) Weight: 120 lb 2.4 oz (54.5 kg) IBW/kg (Calculated) : 45.5 TPN AdjBW (KG): 49 Body mass index is 23.47 kg/m.   Insulin Requirements: none, DC insulin on 6/3  Current Nutrition: GT clamped 6/3 - Pt reports she eats small bites of a variety of foods at meals + Orgain Protein shakes 2-3 / day (each provides 16 g protein) - Add Prostat supplement BID (provides 15 g protein, 100 kcals).  Charted as given on 6/7 and 6/8. - 48 hour calorie count in progress  IVF: none ordered  Central access: PICC 5/25 TPN start date: 5/28  ASSESSMENT                                                                                                          HPI:  Patient has metastatic breast cancer and partial small bowel obstruction on full liquids for 4 months, now intolerance to clear liquids.  Significant events:  5/29 surgery 5/31 advanced to CLD but did not tolerate 6/3 GT clamped, FLD ordered 6/5 advanced to soft diet 6/6 request to cycle TPN as pt to go home with TPN 6/7 Reduce rate of TPN from goal rate 1.8L to 1L since pt is eating and to minimize volume overload.  Paracentesis completed, 570 mL out.   6/8 Thoracentesis completed, 230 mL out.  Today:   Glucose - at goal of <150 (during peak cyclic TPN infusion)   Electrolytes - Na low but improved to 131, K 3.6 is improving, but below goal > 4.  Mag, Phos WNL.   HypoN:  see nephrologist note (6/3, Hypervolemic hyponatremia) IV albumin & salt tabs d/cd 6/3; restarted IV albumin and increased IV lasix 6/7 for massive anasarca, pitting edema.    Renal - WNL, but SCr bumped to 0.92 (previously stable ~ 0.5)  LFTs - AST/ALT improved to WNL, Tbili and Alk phos elevated  TGs - 152, 157 ( 6/3)  Prealbumin - 10.7 ( 5/29);  <5 (6/3)   NUTRITIONAL GOALS                                                                                              RD recs: 5/30 1700-1800 Kcal and 85-95 gm protein Clinimix E 5/15 at a goal rate of 75 ml/hr + 20% fat emulsion at 48m/hr to provide: 90 g/day protein, 1758 Kcal/day. - Reduced rate Clinimix E 5/15 1L / 24 hrs + 20% fat emulsion at 240 ml / 24 hrs to provide: 50 g/day protein, 1190 Kcal/day.   PLAN  Now: KCl 20 mEq PO BID x 2 doses   At 1800 today:  Continue to cycle Clinimix E 5/15 1L over 12 hours: 50 ml x 1 hour then 90 ml/hr x 12 hours then 28m x 1 hour then stop  20% fat emulsion at 262mhr x 12 hours  PO standard multivitamins and trace elements.  TPN lab panels on Mondays & Thursdays.  F/u daily.  Pt wishes to NOT go home with TPN if possible.  F/u ability to stop TPN if meeting KCal/protein goals with oral diet and nutrition supplements.     ChGretta ArabharmD, BCPS Pager [phone]/02/2018 8:23 AM

## 2017-11-19 NOTE — Progress Notes (Signed)
11 Days Post-Op   Subjective/Chief Complaint: Complains of some abdominal soreness   Objective: Vital signs in last 24 hours: Temp:  [97.8 F (36.6 C)-98.9 F (37.2 C)] 98 F (36.7 C) (06/09 0426) Pulse Rate:  [106-114] 114 (06/09 0426) Resp:  [14-20] 14 (06/09 0426) BP: (65-144)/(42-104) 127/73 (06/09 0426) SpO2:  [99 %-100 %] 100 % (06/09 0426) Weight:  [54.5 kg (120 lb 2.4 oz)] 54.5 kg (120 lb 2.4 oz) (06/09 0426) Last BM Date: 11/18/17  Intake/Output from previous day: 06/08 0701 - 06/09 0700 In: 1025.5 [P.O.:480; I.V.:45.5; IV Piggyback:500] Out: 2585 [Urine:1900; Stool:685] Intake/Output this shift: No intake/output data recorded.  General appearance: alert and cooperative Resp: clear to auscultation bilaterally Cardio: regular rate and rhythm GI: soft, mild tenderness. open wound clean. ostomy pink and productive. g tube in place  Lab Results:  Recent Labs    11/18/17 0413 11/19/17 0519  WBC 18.9* 18.4*  HGB 10.0* 9.9*  HCT 28.5* 28.4*  PLT 401* 549*   BMET Recent Labs    11/18/17 0413 11/19/17 0519  NA 129* 131*  K 3.2* 3.6  CL 90* 91*  CO2 28 28  GLUCOSE 106* 119*  BUN 29* 34*  CREATININE 0.67 0.92  CALCIUM 8.6* 9.0   PT/INR No results for input(s): LABPROT, INR in the last 72 hours. ABG No results for input(s): PHART, HCO3 in the last 72 hours.  Invalid input(s): PCO2, PO2  Studies/Results: Dg Chest 1 View  Result Date: 11/18/2017 CLINICAL DATA:  Right thoracentesis EXAM: CHEST  1 VIEW COMPARISON:  11/16/2017 chest radiograph. FINDINGS: Right PICC terminates in lower third of the SVC. Surgical clips overlie the left lower chest. Stable cardiomediastinal silhouette with normal heart size. No pneumothorax. No significant residual right pleural effusion. Stable small left pleural effusion. No pulmonary edema. Persistent patchy left basilar lung opacity, unchanged. IMPRESSION: 1. No right pneumothorax. No significant residual right pleural  effusion. 2. Stable small left pleural effusion and patchy left lung base opacity. Electronically Signed   By: Ilona Sorrel M.D.   On: 11/18/2017 16:38   US Paracentesis  Result Date: 11/17/2017 INDICATION: Patient with history of stage IV lobular breast cancer, recent small bowel resection/transverse colostomy, ascites, bilateral pleural effusions, rectal mass. Request made for diagnostic and therapeutic paracentesis. EXAM: ULTRASOUND GUIDED DIAGNOSTIC AND THERAPEUTIC PARACENTESIS MEDICATIONS: None COMPLICATIONS: None immediate. PROCEDURE: Informed written consent was obtained from the patient after a discussion of the risks, benefits and alternatives to treatment. A timeout was performed prior to the initiation of the procedure. Initial ultrasound scanning demonstrates a small to moderate amount of loculated ascites within the right lower abdominal quadrant. The right lower abdomen was prepped and draped in the usual sterile fashion. 1% lidocaine was used for local anesthesia. Following this, a 6 Fr Safe-T-Centesis catheter was introduced. An ultrasound image was saved for documentation purposes. The paracentesis was performed. The catheter was removed and a dressing was applied. The patient tolerated the procedure well without immediate post procedural complication. FINDINGS: A total of approximately 570 cc of hazy, amber fluid was removed. Samples were sent to the laboratory as requested by the clinical team. Due to the multiloculated nature of the ascites only the above amount of fluid could be removed today. IMPRESSION: Successful ultrasound-guided diagnostic and therapeutic paracentesis yielding 570 cc of peritoneal fluid. Read by: Rowe Robert, PA-C Electronically Signed   By: Sandi Mariscal M.D.   On: 11/17/2017 16:31   US Thoracentesis Asp Pleural Space W/img Guide  Result Date:  11/18/2017 INDICATION: Pleural effusions, metastatic breast cancer EXAM: ULTRASOUND GUIDED RIGHT THORACENTESIS MEDICATIONS: 1%  lidocaine local COMPLICATIONS: None immediate. PROCEDURE: An ultrasound guided thoracentesis was thoroughly discussed with the patient and questions answered. The benefits, risks, alternatives and complications were also discussed. The patient understands and wishes to proceed with the procedure. Written consent was obtained. Ultrasound was performed to localize and mark an adequate pocket of fluid in the right chest. The area was then prepped and draped in the normal sterile fashion. 1% Lidocaine was used for local anesthesia. Under ultrasound guidance a 6 Fr Safe-T-Centesis catheter was introduced. Thoracentesis was performed. The catheter was removed and a dressing applied. FINDINGS: A total of approximately 230 cc of serosanguineous pleural fluid was removed. Samples were sent to the laboratory as requested by the clinical team. IMPRESSION: Successful ultrasound guided right thoracentesis yielding 230 cc of pleural fluid. Electronically Signed   By: Jerilynn Mages.  Shick M.D.   On: 11/18/2017 13:44    Anti-infectives: Anti-infectives (From admission, onward)   Start     Dose/Rate Route Frequency Ordered Stop   11/17/17 1030  piperacillin-tazobactam (ZOSYN) IVPB 3.375 g     3.375 g 12.5 mL/hr over 240 Minutes Intravenous Every 8 hours 11/17/17 1016     11/09/17 0000  cefoTEtan (CEFOTAN) 2 g in sodium chloride 0.9 % 100 mL IVPB     2 g 200 mL/hr over 30 Minutes Intravenous Every 12 hours 11/08/17 1748 11/09/17 0021   11/08/17 1021  sodium chloride 0.9 % with cefoTEtan (CEFOTAN) ADS Med    Note to Pharmacy:  Marchia Meiers   : cabinet override      11/08/17 1021 11/08/17 2229   11/08/17 0600  cefoTEtan (CEFOTAN) 2 g in sodium chloride 0.9 % 100 mL IVPB  Status:  Discontinued    Note to Pharmacy:  Pharmacy may adjust dose strength for optimal dosing.   Send with patient on call to the OR.  Anesthesia to complete antibiotic administration <25min prior to incision per [facility].   2 g 200 mL/hr over 30  Minutes Intravenous On call to O.R. 11/07/17 1234 11/07/17 1433   11/08/17 0600  cefoTEtan in Dextrose 5% (CEFOTAN) IVPB 2 g     2 g Intravenous On call to O.R. 11/07/17 1505 11/08/17 1207   11/07/17 1500  cefoTEtan in Dextrose 5% (CEFOTAN) IVPB 2 g  Status:  Discontinued     2 g Intravenous On call to O.R. 11/07/17 1433 11/07/17 1505      Assessment/Plan: s/p Procedure(s): LAPAROSCOPY DIAGNOSTIC (N/A) SMALL BOWEL RESECTION (N/A) INSERTION OF GASTROSTOMY TUBE (N/A) LYSIS OF ADHESION (N/A) PARTIAL COLECTOMY WITH NEW COLOSTOMY (N/A) Advance diet  g tube clamped Continue dressing changes SBR and partial colectomy with colostomy POD 9 Ambulate Await culture on aspirated abd and pleural fluid Metastatic breast cancer per oncology WBC elevated but no obvious source yet. Started on empiric zosyn for presumed pneumonia    LOS: 16 days    TOTH III,Caroline Matters S 11/19/2017

## 2017-11-20 ENCOUNTER — Inpatient Hospital Stay (HOSPITAL_COMMUNITY): Payer: 59

## 2017-11-20 LAB — CBC WITH DIFFERENTIAL/PLATELET
Basophils Absolute: 0 10*3/uL (ref 0.0–0.1)
Basophils Relative: 0 %
EOS ABS: 0.1 10*3/uL (ref 0.0–0.7)
Eosinophils Relative: 1 %
HCT: 25.6 % — ABNORMAL LOW (ref 36.0–46.0)
Hemoglobin: 8.7 g/dL — ABNORMAL LOW (ref 12.0–15.0)
LYMPHS ABS: 1 10*3/uL (ref 0.7–4.0)
Lymphocytes Relative: 7 %
MCH: 29.2 pg (ref 26.0–34.0)
MCHC: 34 g/dL (ref 30.0–36.0)
MCV: 85.9 fL (ref 78.0–100.0)
MONO ABS: 1.1 10*3/uL — AB (ref 0.1–1.0)
Monocytes Relative: 7 %
Neutro Abs: 13.3 10*3/uL — ABNORMAL HIGH (ref 1.7–7.7)
Neutrophils Relative %: 85 %
PLATELETS: 636 10*3/uL — AB (ref 150–400)
RBC: 2.98 MIL/uL — AB (ref 3.87–5.11)
RDW: 15.4 % (ref 11.5–15.5)
WBC: 15.6 10*3/uL — AB (ref 4.0–10.5)

## 2017-11-20 LAB — GLUCOSE, PLEURAL OR PERITONEAL FLUID: Glucose, Fluid: 92 mg/dL

## 2017-11-20 LAB — COMPREHENSIVE METABOLIC PANEL
ALT: 28 U/L (ref 14–54)
AST: 35 U/L (ref 15–41)
Albumin: 3.3 g/dL — ABNORMAL LOW (ref 3.5–5.0)
Alkaline Phosphatase: 303 U/L — ABNORMAL HIGH (ref 38–126)
Anion gap: 11 (ref 5–15)
BILIRUBIN TOTAL: 1 mg/dL (ref 0.3–1.2)
BUN: 39 mg/dL — ABNORMAL HIGH (ref 6–20)
CHLORIDE: 92 mmol/L — AB (ref 101–111)
CO2: 29 mmol/L (ref 22–32)
CREATININE: 0.89 mg/dL (ref 0.44–1.00)
Calcium: 9.1 mg/dL (ref 8.9–10.3)
Glucose, Bld: 103 mg/dL — ABNORMAL HIGH (ref 65–99)
POTASSIUM: 3.2 mmol/L — AB (ref 3.5–5.1)
Sodium: 132 mmol/L — ABNORMAL LOW (ref 135–145)
TOTAL PROTEIN: 5.9 g/dL — AB (ref 6.5–8.1)

## 2017-11-20 LAB — ALBUMIN, PLEURAL OR PERITONEAL FLUID: ALBUMIN FL: 2.1 g/dL

## 2017-11-20 LAB — PREALBUMIN: Prealbumin: 10.8 mg/dL — ABNORMAL LOW (ref 18–38)

## 2017-11-20 LAB — LACTATE DEHYDROGENASE, PLEURAL OR PERITONEAL FLUID: LD FL: 121 U/L — AB (ref 3–23)

## 2017-11-20 LAB — BODY FLUID CELL COUNT WITH DIFFERENTIAL
LYMPHS FL: 25 %
MONOCYTE-MACROPHAGE-SEROUS FLUID: 17 % — AB (ref 50–90)
NEUTROPHIL FLUID: 58 % — AB (ref 0–25)
Total Nucleated Cell Count, Fluid: 551 cu mm (ref 0–1000)

## 2017-11-20 LAB — TRIGLYCERIDES: Triglycerides: 148 mg/dL (ref <150)

## 2017-11-20 LAB — PHOSPHORUS: PHOSPHORUS: 4.1 mg/dL (ref 2.5–4.6)

## 2017-11-20 LAB — MAGNESIUM: Magnesium: 2.1 mg/dL (ref 1.7–2.4)

## 2017-11-20 MED ORDER — LIDOCAINE HCL 1 % IJ SOLN
INTRAMUSCULAR | Status: AC
  Filled 2017-11-20: qty 20

## 2017-11-20 MED ORDER — CLINIMIX E/DEXTROSE (5/15) 5 % IV SOLN
INTRAVENOUS | Status: AC
Start: 2017-11-20 — End: 2017-11-21
  Administered 2017-11-20 (×2): via INTRAVENOUS
  Filled 2017-11-20: qty 1000

## 2017-11-20 MED ORDER — POTASSIUM CHLORIDE 10 MEQ/100ML IV SOLN
10.0000 meq | INTRAVENOUS | Status: AC
  Administered 2017-11-20 (×4): 10 meq via INTRAVENOUS
  Filled 2017-11-20 (×4): qty 100

## 2017-11-20 MED ORDER — FAT EMULSION PLANT BASED 20 % IV EMUL
240.0000 mL | INTRAVENOUS | Status: AC
  Administered 2017-11-20: 240 mL via INTRAVENOUS
  Filled 2017-11-20: qty 250

## 2017-11-20 MED ORDER — POTASSIUM CHLORIDE CRYS ER 20 MEQ PO TBCR: 40.0000 meq | EXTENDED_RELEASE_TABLET | Freq: Two times a day (BID) | ORAL | Status: DC

## 2017-11-20 NOTE — Progress Notes (Signed)
    Durable Medical Equipment  (From admission, onward)        Start     Ordered   11/20/17 1001  For home use only DME Hospital bed  Once    Question Answer Comment  The above medical condition requires: Patient requires the ability to reposition frequently   Head must be elevated greater than: 45 degrees   Bed type Semi-electric      11/20/17 1002

## 2017-11-20 NOTE — Progress Notes (Addendum)
PHARMACY - ADULT TOTAL PARENTERAL NUTRITION CONSULT NOTE   Pharmacy Consult for TPN Indication: Intolerance to enteral feeding   Patient Measurements: Height: 5' (152.4 cm) Weight: 119 lb 4.3 oz (54.1 kg) IBW/kg (Calculated) : 45.5 TPN AdjBW (KG): 49 Body mass index is 23.29 kg/m.   Insulin Requirements: none, DC insulin on 6/3  Current Nutrition: GT clamped 6/3 - Pt reports she eats small bites of a variety of foods at meals + Orgain Protein shakes 2-3 / day (each provides 16 g protein) - Add Prostat supplement BID (provides 15 g protein, 100 kcals).  Charted as given on 6/7 and 6/8. - 48 hour calorie count in progress  IVF: none ordered  Central access: PICC 5/25 TPN start date: 5/28  ASSESSMENT                                                                                                          HPI:  Patient has metastatic breast cancer and partial small bowel obstruction on full liquids for 4 months, now intolerance to clear liquids.  Significant events:  5/29 surgery 5/31 advanced to CLD but did not tolerate 6/3 GT clamped, FLD ordered 6/5 advanced to soft diet 6/6 request to cycle TPN as pt to go home with TPN 6/7 Reduce rate of TPN from goal rate 1.8L to 1L since pt is eating and to minimize volume overload.  Paracentesis completed, 570 mL out.   6/8 Thoracentesis completed, 230 mL out. 6/10:  plan L Thoracentesis, Albumin discontinued, good diuresis  Today:   Glucose - at goal of <150 (during peak cyclic TPN infusion)   Electrolytes - Na low but improved to 132, K down again to  3.2, below goal > 4.  Mag, Phos WNL.   HypoN:  see nephrologist note (6/3, Hypervolemic hyponatremia) IV albumin & salt tabs d/cd 6/3; restarted IV albumin and increased IV lasix 6/7 for massive anasarca, pitting edema.  - 6/10 discontinued Albumin    Renal - WNL, SCr 0.89 (previously stable ~ 0.5)  LFTs - AST/ALT remain WNL, Tbili now wnl, Alk phos remains elevated, increasing    TGs - 152, 157 ( 6/3), 148 (6/10)  Prealbumin - 10.7 ( 5/29);  <5 (6/3), 10.8 (6/10)   NUTRITIONAL GOALS                                                                                             RD recs: 5/30 1700-1800 Kcal and 85-95 gm protein Clinimix E 5/15 at a goal rate of 75 ml/hr + 20% fat emulsion at 81m/hr to provide: 90 g/day protein, 1758 Kcal/day. - Reduced rate Clinimix E 5/15 1L / 24 hrs + 20% fat  emulsion at 240 ml / 24 hrs to provide: 50 g/day protein, 1190 Kcal/day.   PLAN                                                                                                         Now:  K runs 15mq x4, oral K ordered - but discontinued in favor of IV repletion  At 1800 today:  Continue to cycle Clinimix E 5/15 1L over 12 hours: 50 ml x 1 hour then 90 ml/hr x 10 hours then 593mx 1 hour then stop  20% fat emulsion at 2046mr x 12 hours  PO standard multivitamins and trace elements.  TPN lab panels on Mondays & Thursdays.  BMET tomorrow, follow up K repletion  F/u daily.  Pt wishes to NOT go home with TPN, but surgery feels will need to continue at discharge.     [name] Pager [phone] 775910/2019, 11:35 AM

## 2017-11-20 NOTE — Progress Notes (Signed)
Occupational Therapy Treatment Patient Details Name: Sonia Hill MRN: [id number] DOB: [birth year] Today's Date: 11/20/2017    History of present illness 62 year old female was admitted with abdomen distention. PMH:  metastatic breast CA, colostomy, arthritis and CKD.  s/p small bowel resection, insertion of g tube, lysis of adhesions, partial colectomy and new colostomy   OT comments  Pt really wants to be I - will practice with AE next sessiion  Follow Up Recommendations  Supervision/Assistance - 24 hour    Equipment Recommendations  3 in 1 bedside commode    Recommendations for Other Services      Precautions / Restrictions Precautions Precautions: Fall       Mobility Bed Mobility   Bed Mobility: Sit to Supine       Sit to supine: Mod assist   General bed mobility comments: using gait belt as a leg lifter  Transfers Overall transfer level: Needs assistance Equipment used: Rolling walker (2 wheeled) Transfers: Sit to/from [facility] Sit to Stand: Min assist Stand pivot transfers: Min assist            Balance Overall balance assessment: Needs assistance           Standing balance-Leahy Scale: Fair                             ADL either performed or assessed with clinical judgement   ADL Overall ADL's : Needs assistance/impaired             Lower Body Bathing: Moderate assistance;Sit to/from stand           Toilet Transfer: Supervision/safety;Stand-pivot;Regular Toilet;RW;Minimal assistance   Toileting- Water quality scientist and Hygiene: Supervision/safety;Sit to/from stand;Minimal assistance               Vision Patient Visual Report: No change from baseline     Perception     Praxis      Cognition Arousal/Alertness: Awake/alert Behavior During Therapy: WFL for tasks assessed/performed Overall Cognitive Status: Within Functional Limits for tasks assessed                                           Exercises     Shoulder Instructions       General Comments      Pertinent Vitals/ Pain       Pain Assessment: No/denies pain Pain Score: 4  Pain Location: abdomen Pain Descriptors / Indicators: Sore Pain Intervention(s): Monitored during session;Repositioned  Home Living                                          Prior Functioning/Environment              Frequency  Min 2X/week        Progress Toward Goals  OT Goals(current goals can now be found in the care plan section)  Progress towards OT goals: Progressing toward goals     Plan Discharge plan remains appropriate    Co-evaluation                 AM-PAC PT "6 Clicks" Daily Activity     Outcome Measure  End of Session    OT Visit Diagnosis: Unsteadiness on feet (R26.81);Muscle weakness (generalized) (M62.81)   Activity Tolerance Patient tolerated treatment well   Patient Left in bed;with call bell/phone within reach   Nurse Communication Mobility status        Time: 1650-1706 OT Time Calculation (min): 16 min  Charges: OT General Charges $OT Visit: 1 Visit OT Treatments $Self Care/Home Management : 8-22 mins  [place], Christian   Betsy Pries 11/20/2017, 5:34 PM

## 2017-11-20 NOTE — Progress Notes (Signed)
[facility] Progress Note  12 Days Post-Op  Subjective: CC:  C/o drainage from midline incision. Reports eating her whole plate of food at meals yesterday and this morning for breakfast. During my exam there was an empty breakfast plate at bedside. Pain improving. Mobilizing twice daily. Urinating frequently. Having gas/stool via colostomy.  Objective: Vital signs in last 24 hours: Temp:  [98.5 F (36.9 C)-98.6 F (37 C)] 98.5 F (36.9 C) (06/10 0610) Pulse Rate:  [109-120] 120 (06/10 0610) Resp:  [16-22] 16 (06/10 0610) BP: (116-128)/(62-92) 128/73 (06/10 0610) SpO2:  [100 %] 100 % (06/10 0610) Weight:  [54.1 kg (119 lb 4.3 oz)] 54.1 kg (119 lb 4.3 oz) (06/10 0500) Last BM Date: 11/19/17  Intake/Output from previous day: 06/09 0701 - 06/10 0700 In: 1073.2 [P.O.:720; I.V.:3.2; IV Piggyback:350] Out: 2225 [Urine:2000; Stool:225] Intake/Output this shift: No intake/output data recorded.  PE: Gen: Alert, NAD, pleasant CV: RRR, pitting edema BLE  Pulm: Normal effort, clear to auscultation bilaterally DXI:PJAS, edematous,appropriately tender,midline dressing w/ yellow, serous drainage on gauze, incision granulating appropriately. G-tube clamped, colostomy pouch in place with gasand liquid stoolin pouch, stoma viable.  Skin: warm and dry, no rashes  Psych: A&Ox3  Lab Results:  Recent Labs    11/19/17 0519 11/20/17 0444  WBC 18.4* 15.6*  HGB 9.9* 8.7*  HCT 28.4* 25.6*  PLT 549* 636*   BMET Recent Labs    11/19/17 0519 11/20/17 0444  NA 131* 132*  K 3.6 3.2*  CL 91* 92*  CO2 28 29  GLUCOSE 119* 103*  BUN 34* 39*  CREATININE 0.92 0.89  CALCIUM 9.0 9.1   PT/INR No results for input(s): LABPROT, INR in the last 72 hours. CMP     Component Value Date/Time   NA 132 (L) 11/20/2017 0444   NA 134 (L) 12/28/2016 1503   K 3.2 (L) 11/20/2017 0444   K 4.0 12/28/2016 1503   CL 92 (L) 11/20/2017 0444   CL 98 12/03/2012 1419   CO2 29 11/20/2017  0444   CO2 25 12/28/2016 1503   GLUCOSE 103 (H) 11/20/2017 0444   GLUCOSE 96 12/28/2016 1503   GLUCOSE 89 12/03/2012 1419   BUN 39 (H) 11/20/2017 0444   BUN 12.0 12/28/2016 1503   CREATININE 0.89 11/20/2017 0444   CREATININE 0.81 09/13/2017 1157   CREATININE 0.8 12/28/2016 1503   CALCIUM 9.1 11/20/2017 0444   CALCIUM 9.7 12/28/2016 1503   PROT 5.9 (L) 11/20/2017 0444   PROT 6.5 12/28/2016 1503   ALBUMIN 3.3 (L) 11/20/2017 0444   ALBUMIN 3.9 12/28/2016 1503   AST 35 11/20/2017 0444   AST 25 09/13/2017 1157   AST 22 12/28/2016 1503   ALT 28 11/20/2017 0444   ALT 12 09/13/2017 1157   ALT 20 12/28/2016 1503   ALKPHOS 303 (H) 11/20/2017 0444   ALKPHOS 76 12/28/2016 1503   BILITOT 1.0 11/20/2017 0444   BILITOT 0.6 09/13/2017 1157   BILITOT 0.50 12/28/2016 1503   GFRNONAA >60 11/20/2017 0444   GFRNONAA >60 09/13/2017 1157   GFRAA >60 11/20/2017 0444   GFRAA >60 09/13/2017 1157   Lipase     Component Value Date/Time   LIPASE 37 11/03/2017 1828       Studies/Results: Dg Chest 1 View  Result Date: 11/18/2017 CLINICAL DATA:  Right thoracentesis EXAM: CHEST  1 VIEW COMPARISON:  11/16/2017 chest radiograph. FINDINGS: Right PICC terminates in lower third of the SVC. Surgical clips overlie the left lower chest. Stable cardiomediastinal silhouette with  normal heart size. No pneumothorax. No significant residual right pleural effusion. Stable small left pleural effusion. No pulmonary edema. Persistent patchy left basilar lung opacity, unchanged. IMPRESSION: 1. No right pneumothorax. No significant residual right pleural effusion. 2. Stable small left pleural effusion and patchy left lung base opacity. Electronically Signed   By: Ilona Sorrel M.D.   On: 11/18/2017 16:38   Dg Chest Port 1 View  Result Date: 11/19/2017 CLINICAL DATA:  Dyspnea EXAM: PORTABLE CHEST 1 VIEW COMPARISON:  Chest radiograph from one day prior. FINDINGS: Right PICC terminates in lower third of the SVC. Surgical  clips overlie the left chest wall. Stable cardiomediastinal silhouette with normal heart size. No pneumothorax. Small bilateral pleural effusions are probably stable accounting for differences in positioning. No pulmonary edema. Left basilar atelectasis is stable. IMPRESSION: 1. Small bilateral pleural effusions, probably stable accounting for differences in positioning. 2. Left basilar atelectasis, stable. Electronically Signed   By: Ilona Sorrel M.D.   On: 11/19/2017 08:00   US Thoracentesis Asp Pleural Space W/img Guide  Result Date: 11/18/2017 INDICATION: Pleural effusions, metastatic breast cancer EXAM: ULTRASOUND GUIDED RIGHT THORACENTESIS MEDICATIONS: 1% lidocaine local COMPLICATIONS: None immediate. PROCEDURE: An ultrasound guided thoracentesis was thoroughly discussed with the patient and questions answered. The benefits, risks, alternatives and complications were also discussed. The patient understands and wishes to proceed with the procedure. Written consent was obtained. Ultrasound was performed to localize and mark an adequate pocket of fluid in the right chest. The area was then prepped and draped in the normal sterile fashion. 1% Lidocaine was used for local anesthesia. Under ultrasound guidance a 6 Fr Safe-T-Centesis catheter was introduced. Thoracentesis was performed. The catheter was removed and a dressing applied. FINDINGS: A total of approximately 230 cc of serosanguineous pleural fluid was removed. Samples were sent to the laboratory as requested by the clinical team. IMPRESSION: Successful ultrasound guided right thoracentesis yielding 230 cc of pleural fluid. Electronically Signed   By: Jerilynn Mages.  Shick M.D.   On: 11/18/2017 13:44    Anti-infectives: Anti-infectives (From admission, onward)   Start     Dose/Rate Route Frequency Ordered Stop   11/17/17 1030  piperacillin-tazobactam (ZOSYN) IVPB 3.375 g     3.375 g 12.5 mL/hr over 240 Minutes Intravenous Every 8 hours 11/17/17 1016      11/09/17 0000  cefoTEtan (CEFOTAN) 2 g in sodium chloride 0.9 % 100 mL IVPB     2 g 200 mL/hr over 30 Minutes Intravenous Every 12 hours 11/08/17 1748 11/09/17 0021   11/08/17 1021  sodium chloride 0.9 % with cefoTEtan (CEFOTAN) ADS Med    Note to Pharmacy:  Marchia Meiers   : cabinet override      11/08/17 1021 11/08/17 2229   11/08/17 0600  cefoTEtan (CEFOTAN) 2 g in sodium chloride 0.9 % 100 mL IVPB  Status:  Discontinued    Note to Pharmacy:  Pharmacy may adjust dose strength for optimal dosing.   Send with patient on call to the OR.  Anesthesia to complete antibiotic administration <81min prior to incision per [facility].   2 g 200 mL/hr over 30 Minutes Intravenous On call to O.R. 11/07/17 1234 11/07/17 1433   11/08/17 0600  cefoTEtan in Dextrose 5% (CEFOTAN) IVPB 2 g     2 g Intravenous On call to O.R. 11/07/17 1505 11/08/17 1207   11/07/17 1500  cefoTEtan in Dextrose 5% (CEFOTAN) IVPB 2 g  Status:  Discontinued     2 g Intravenous On call to  O.R. 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 -oncologist: Dr. Magrinat;resumedexemestane, holdingabemaciclib until after discharge.  pSBO 2/2 metastatic breast CA w/ known pelvic metastasis S/P diagnostic laparoscopy, LOA, SBR, insertion gastrostomy tube, partial colectomy new colostomy 11/08/17 Dr. Johney Maine - POD#12 - PO pain control - scheduled tylenol,home gabapentin/xanax, po oxycodone PRN. - VAC removed; daily wet-to-dry dressing changes to midline wound - having bowel function,G-tube clamped -Soft diet +TNA  - pepcid for reflux -  mobilize/IS  Leukocytosis - 15 from 18;  UA negative, CT abd/pelvis not impressive, atelectasis w/ ? LLL PNA but CXR looks stable compared to 5 d ago, NGTD on blood CX or body fluid CX.  malnutirion - 48 h calorie count pt only meeting ~56% of nutritional needs w/ PO intake, continue TNA   FEN: SOFT DIET + proteinshakes TID;continue cyclic TNA; hypervolemic hyponatremia  VTE: SCD's, Lovenox ID: perioperative cefotetan; Zosyn 6/9>> empirically for possible PNA as no other source of infection can be identified at thsi time.  Foley: removed 5/31 (POD#2) Follow up: Dr. Johney Maine, Dr. [name]   Plan: Abd exam benign. Tolerating PO but not consisently meeting nutritional needs w/ PO intake alone so may need TNA at discharge - appreciate dietician recommendations. I think if pts WBC continues to trend down tomorrow then she can be discharged home on appropriate abx regimen for presumed PNA.       LOS: 17 days    [facility] 11/20/2017, 8:24 AM Pager: [phone] Consults: [phone] Mon-Fri 7:00 am-4:30 pm Sat-Sun 7:00 am-11:30 am

## 2017-11-20 NOTE — Progress Notes (Signed)
PROGRESS NOTE    Sonia Hill  ZMO:[id number] DOB: March 14, 1956 DOA: 11/03/2017 PCP: [name], MD   Brief Narrative:  The patient is a 52 female with metastatic breast cancer who sees Dr. Elza Rafter, Hx of Colostomy + rectal mass 04/2017-complicated by SBO-recurrent SBO 06/2017 and currently on liquid diet at home. She was admitted with severe excruciating abdominal pain no ostomy output. CT Abdomen showed worsening cancer. Underwent Surgery on 5/29 and had Diagnostic Laparoscopy, Lysis of Adhesions, Small Bowel Resection, Insertion of Gastrotomy Tube and Partial Colectomy with new Colostomy. Currently on TPN and diet being advanced to soft Diet and is doing better but WBC started Elevating so will pan-culture. CT of Abdomen and CXR show possible PNA so patient was started on Empiric Zosyn. Patient underwent Thoracentesis x 2 (Left and Right) and Paracentesis and remains grossly volume overloaded but is improving slowly with diuresis.  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    Ascites   New onset left bundle branch block (LBBB)   Metastatic breast cancer to small intestine causing SBO s/p SB resection 11/08/2017   Metastatic breast cancer to colostomy s/p colectomy/colostomy 11/08/2017   Cachexia ([place])   Malnutrition of moderate degree   Anemia  Malignant bowel obstruction/small bowel obstruction secondary to Breast Cancer Metastasis status post small bowel resection 11/08/2017 -Patient noted to have no significant improvement with small bowel  obstruction and subsequently underwent laparoscopic diagnostic small bowel resection, insertion of gastrostomy tube, lysis of adhesion, partial colectomy with new colostomy per Dr. Johney Maine 11/08/2017.   -NG tube has been discontinued per General Surgery.   -IV Robaxin 500 mg q8hhas been added to patient's regimen.   -Patient noted to have some bowel function.   -Tolerating Soft Diet but also will have TNA cycled.  -TNA per General Surgery and even though patient is tolerating po not consistently meeting Nutritional needs so are anticipating TNA at D/C  -Pain regimen has been adjusted per General Surgery; Continue current regimen  Hyponatremia, improved  -Initially felt likely secondary to hypervolemic hyponatremia plus or minus SIADH and now likley from massive volume overloaded  -Patient noted to have lower extremity edema as well as low albumin levels.   -Sodium is now 132 and IV Diuresis with 80 mg BID started and will continue  -TSH within normal limits.   -Cortisol level within normal limits.  -Nephrology consulted to assessed the patient and feel hyponatremia may be secondary to hypervolemia with anasarca.   -Patient was started on IV albumin and low-dose salt tablets as well as compression stockings but Salt Tablets have been stopped now; IV Albumin Stopped today -Sodium levels currently went from 125 -> 129 -> 131 -> 132 -Transitioned to oral Lasix 80 mg BID once improved but continue IV Furosemide 80 mg BID as she is massively volume overloaded for now -Nephrology following and appreciate input and recommendations.   Hypokalemia/hypocalcemia/hypomagnesemia -Continue TPN/TNA per General Surgery  -Electrolytes being repleted per pharmacy through [facility]  Hypothyroidism  -TSH 3.123.  IV Synthroid and changed to po yesterday -C/w po 100 mcg po Daily.  Metastatic estrogen positive lobular breast cancer -Patient with metastatic spread to the spine and rectal area resulting  in blockage and diverging colostomy.   -Oncology following and feel as if she is turning the corner -Dr. [name] recommending resuming Exemestane at this point and continue to hold Abemaciclib   Solitary kidney with Hydronephrosis -Status post Foley catheter placement.   -Renal function stable. Urology consulted and no further work-up needed during this hospitalization. -Outpatient follow-up with Urology.    Anxiety -Ativan as needed.  Severe protein calorie malnutrition -On TPN per General Surgery  -Nutrition is consulted and appreciate further evaluation recommendations -Continue Ensure Enlive 237 mL's p.o. 3 times daily with meals  Volume Overload/Anasarca -Patient with lower extremity edema, abdominal swelling and anasarca  -Likely component of hypoalbuminemia. -Resumed IV Lasix at 80 mg BID and will hold po doses  -Urine output not accurately recorded.   -Continue to MonitorStrict I's and O's.  Daily weights. -Patient is +10.704 Liters since Admission and Weight is now 119 (up from 108 on Admission) -Ordered Thoracentesis and Paracentesis and Had Left Thoracentesis today   New left bundle branch block -Noted on EKG the night of 11/08/2017.   -Patient denies any chest pain no shortness of breath.   -2D echo with a EF of 65 to 70%, no wall motion abnormalities, left ventricular diastolic function normal.   -Patient has been seen in consultation by Cardiology and no further ischemic work-up recommended at this time.   Singultus/Hiccups, improved -Likely secondary to malignant bowel obstruction.   -Patient on Reglan which has been placed 3 times daily at a lower dose per oncology but now Stopped.  -Patient given a dose of Thorazine however was having's some side effects from it and as such Thorazine has been discontinued. -Patient given Cogentin 1 mg IV every 12 hours x2 doses.   -Clinical improvement with hiccups noted  Normocytic Anemia -Patient is status post 2 units  packed red blood cells 11/12/2017. -Patient denies any overt bleeding.   -Colostomy with small brown stool.  FOBT was positive.  Anemia panel has been ordered and looks like patient does have a severe iron deficiency anemia with iron level less than 5.   -IV Feraheme x1.  LDH, haptoglobin, hepatic panel not consistent with hemolysis.  CT abdomen and pelvis was not done.   -Patient was seen in consultation by Gastroenterology who are recommending serial CBCs.  Transfuse as needed.   -Hb/Hct was stable at 9.9/28.4 and dropped slightly to 8.7/25.6 -Continue to Monitor for S/Sx of Bleeding -Repeat CBC in AM  Leukocytosis likely in the setting of PNA and SBP -Worsening -WBC went from 9.6 -> 14.2 -> 17.3 -> 19.1 -> 18.9 -> 18.4 -> 15.6 -Urinalysis Rechecked along with Urine Culture and Urinalysis Negative  -Last Urine Cx on 6/1 showed 30,000 CFU of Pseudomonas Aeruginosa; Repeat Cx showed Multiple Species Present  -Not on Abx but will start on Empiric Zosyn given possible Pneumonia noted on CT Scan of Abdomen and CXR -Repeat Blood Cultures showed NGTD at 1 day -CXR showed Left pleural effusion and basilar opacity are unchanged from chest radiograph 5 days ago. Basilar opacity likely secondary to atelectasis, sterility technically indeterminate by radiograph. Improved right pleural effusion and atelectasis from prior exam. Nonew airspace disease. -Checked CT Abdomen and Pelvis showed Recent small bowel resection and transverse colostomy. Moderate volume of intra-abdominal ascites with slight peritoneal enhancement, but no well-defined abscess. No free air. Moderate bilateral pleural effusions and bibasilar airspace disease. On the  right this is consistent with compressive atelectasis, on the left may be atelectasis or pneumonia in the setting of leukocytosis. Whole body wall edema and anasarca. Enhancing rectal mass invading the uterus and possibly posterior wall of the bladder. Stable right hydronephrosis  and proximal hydroureter. Possible lucent lesions within L1 and L3 vertebral bodies out or not seen on 07/10/2017 CT, and may represent osseous metastatic Disease -Ordered Thoracentesis and Paracentesis -Paracentesis done and showed Cloudy Appearance with 2837 WBC's and 82 Neutrophils with other Cx studies pending  -Right Thoracentesis done yesterday but Pneumonia and Left Pleural Fluid on the Left  -Repeat CBC in AM   Abnormal LFT's/Elevated Alk Phos,improving  -AST was 108, ALT was 108, and Alk Phos was 471; Now improved and AST is 35, ALT is 28 and Alk Phos is 303 -Likely from TNA/TPN -Continue to Monitor and Repeat CMP in AM   Hypokalemia, improved -Patient's potassium level is 3.2 this AM -Continue to monitor and replete as necessary and being replaced by Pharmacy with TNA -Given IV KCl 40 mEQ today  -Repeat CMP in a.m.  Hyperbilirubinemia -Mild at 1.3 and now improved at 1.0 -Continue to Monitor and Repeat CMP in AM  DVT prophylaxis: SCDs Code Status: FULL CODE Family Communication: No family present at bedside Disposition Plan: Anticipate D/C Home when medically stable in next 48-72 hours hopefully if not sooner  Consultants:   General Surgery: [name] 11/04/2017  Palliative care: Dr. Rowe Pavy 10/30/2017  Cardiology: Dr.Skains 11/10/2017  Nephrology: Dr: Jonnie Finner 11/11/2017  Gastroenterology: Dr. Cristina Gong 11/12/2017  Urology: Dr. Alinda Money 11/13/2017  Pharmacy for TNA  Procedures: S/P diagnostic laparoscopy, LOA, SBR, insertion gastrostomy tube, partial colectomy new colostomy 11/08/17 Dr. Johney Maine  Throacentesis Right - Drained 230 mL Thoracentesis Left - Drained 130 mL  Paracentesis - Removed 570 mL  Antimicrobials:  Anti-infectives (From admission, onward)   Start     Dose/Rate Route Frequency Ordered Stop   11/17/17 1030  piperacillin-tazobactam (ZOSYN) IVPB 3.375 g     3.375 g 12.5 mL/hr over 240 Minutes Intravenous Every 8 hours 11/17/17 1016     11/09/17 0000   cefoTEtan (CEFOTAN) 2 g in sodium chloride 0.9 % 100 mL IVPB     2 g 200 mL/hr over 30 Minutes Intravenous Every 12 hours 11/08/17 1748 11/09/17 0021   11/08/17 1021  sodium chloride 0.9 % with cefoTEtan (CEFOTAN) ADS Med    Note to Pharmacy:  Marchia Meiers   : cabinet override      11/08/17 1021 11/08/17 2229   11/08/17 0600  cefoTEtan (CEFOTAN) 2 g in sodium chloride 0.9 % 100 mL IVPB  Status:  Discontinued    Note to Pharmacy:  Pharmacy may adjust dose strength for optimal dosing.   Send with patient on call to the OR.  Anesthesia to complete antibiotic administration <90mn [facility]   2 g 200 mL/hr over 30 Minutes Intravenous On call to O.R. 11/07/17 1234 11/07/17 1433   11/08/17 0600  cefoTEtan in Dextrose 5% (CEFOTAN) IVPB 2 g     2 g Intravenous On call to O.R. 11/07/17 1505 11/08/17 1207   11/07/17 1500  cefoTEtan in Dextrose 5% (CEFOTAN) IVPB 2 g  Status:  Discontinued     2 g Intravenous On call to O.R. 11/07/17 1433 11/07/17 1505     Subjective: Seen and examined and felt as if legs were improving.  No chest pain, shortness of breath, nausea, vomiting.  Did not feel as distended.  Had an okay night.  Wanted to know what she could eat with her diet and was asking to see dietitian.  No other concerns or complaints at this time.  Objective: Vitals:   11/20/17 0610 11/20/17 1041 11/20/17 1105 11/20/17 1258  BP: 128/73 (!) 134/102 119/64 131/73  Pulse: (!) 120   (!) 116  Resp: 16     Temp: 98.5 F (36.9 C)     TempSrc: Oral     SpO2: 100%   99%  Weight:      Height:        Intake/Output Summary (Last 24 hours) at 11/20/2017 1855 Last data filed at 11/20/2017 1815 Gross per 24 hour  Intake -  Output 3125 ml  Net -3125 ml   Filed Weights   11/18/17 0500 11/19/17 0426 11/20/17 0500  Weight: 56.6 kg (124 lb 12.5 oz) 54.5 kg (120 lb 2.4 oz) 54.1 kg (119 lb 4.3 oz)   Examination: Physical Exam:  Constitutional: Caucasian female currently no  acute distress peers, comfortable and had a lot of questions about discharge disposition. Eyes: Sclera anicteric.  Lids and conjunctive are normal. ENMT: External ears and nose appear normal.  Grossly normal hearing Neck: Supple with no JVD Respiratory: Diminished to auscultation bilaterally with mild crackles.  No appreciable wheezing, rales, rhonchi.  Labored breathing is not using accessory muscles to breathe Cardiovascular: Tachycardic rate and regular rhythm.  No appreciable murmurs, rubs, gallops.  Has 1-2+ lower extremity edema and still remains acidotic Abdomen: Soft, slightly tender to palpate.  Still remains distended with abdominal wall edema but however is improved.  Colostomy in place with gaseous distention and some liquid stool.  Abdominal scarring is noted GU: Deferred Musculoskeletal: No contractures or cyanosis.  No joint deformities.  Has a right PICC line in place Skin: Warm and dry with no appreciable rashes or lesions limited skin evaluation Neurologic: Cranial nerves II through XII grossly intact.  No appreciable focal deficits Psychiatric: Normal mood and pleasant affect.  Intact judgment insight  Data Reviewed: I have personally reviewed following labs and imaging studies  CBC: Recent Labs  Lab 11/16/17 1637 11/17/17 0909 11/18/17 0413 11/19/17 0519 11/20/17 0444  WBC 17.3* 19.1* 18.9* 18.4* 15.6*  NEUTROABS 15.2* 17.2* 17.1* 16.6* 13.3*  HGB 10.8* 10.0* 10.0* 9.9* 8.7*  HCT 30.6* 28.5* 28.5* 28.4* 25.6*  MCV 81.4 83.8 82.6 84.5 85.9  PLT 290 307 401* 549* 680*   Basic Metabolic Panel: Recent Labs  Lab 11/16/17 0416 11/17/17 0352 11/18/17 0413 11/19/17 0519 11/20/17 0444  NA 128* 125* 129* 131* 132*  K 3.5 3.5 3.2* 3.6 3.2*  CL 93* 90* 90* 91* 92*  CO2 '26 25 28 28 29  ' GLUCOSE 99 100* 106* 119* 103*  BUN 26* 26* 29* 34* 39*  CREATININE 0.58 0.52 0.67 0.92 0.89  CALCIUM 8.7* 8.1* 8.6* 9.0 9.1  MG 1.8 1.7 2.1 2.2 2.1  PHOS 3.6 3.6 3.7 3.9 4.1    GFR: Estimated Creatinine Clearance: 47.1 mL/min (by C-G formula based on SCr of 0.89 mg/dL). Liver Function Tests: Recent Labs  Lab 11/16/17 0416 11/17/17 0352 11/18/17 0413 11/19/17 0519 11/20/17 0444  AST 108* 37 31 30 35  ALT 108* 53 36 28 28  ALKPHOS 471* 363* 323* 262* 303*  BILITOT 1.1 1.0 1.3* 1.3* 1.0  PROT 5.1* 4.7* 5.7* 6.1* 5.9*  ALBUMIN 2.5* 2.1* 3.0* 3.4* 3.3*   No results for input(s): LIPASE, AMYLASE in the last 168 hours. No results for input(s): AMMONIA  in the last 168 hours. Coagulation Profile: No results for input(s): INR, PROTIME in the last 168 hours. Cardiac Enzymes: No results for input(s): CKTOTAL, CKMB, CKMBINDEX, TROPONINI in the last 168 hours. BNP (last 3 results) No results for input(s): PROBNP in the last 8760 hours. HbA1C: No results for input(s): HGBA1C in the last 72 hours. CBG: No results for input(s): GLUCAP in the last 168 hours. Lipid Profile: Recent Labs    11/20/17 0444  TRIG 148   Thyroid Function Tests: No results for input(s): TSH, T4TOTAL, FREET4, T3FREE, THYROIDAB in the last 72 hours. Anemia Panel: No results for input(s): VITAMINB12, FOLATE, FERRITIN, TIBC, IRON, RETICCTPCT in the last 72 hours. Sepsis Labs: No results for input(s): PROCALCITON, LATICACIDVEN in the last 168 hours.  Recent Results (from the past 240 hour(s))  Culture, Urine     Status: Abnormal   Collection Time: 11/11/17  8:18 AM  Result Value Ref Range Status   Specimen Description   Final    URINE, RANDOM Performed at [place] [address] [place], [place] [address]    Special Requests   Final    NONE Performed at [facility], [place] [address], [place], [place] [address]    Culture 30,000 COLONIES/mL PSEUDOMONAS AERUGINOSA (A)  Final   Report Status 11/13/2017 FINAL  Final   Organism ID, Bacteria PSEUDOMONAS AERUGINOSA (A)  Final      Susceptibility   Pseudomonas aeruginosa - MIC*     CEFTAZIDIME <=1 SENSITIVE Sensitive     CIPROFLOXACIN <=0.25 SENSITIVE Sensitive     GENTAMICIN <=1 SENSITIVE Sensitive     IMIPENEM 1 SENSITIVE Sensitive     PIP/TAZO <=4 SENSITIVE Sensitive     CEFEPIME <=1 SENSITIVE Sensitive     * 30,000 COLONIES/mL PSEUDOMONAS AERUGINOSA  Culture, Urine     Status: Abnormal   Collection Time: 11/16/17 10:55 AM  Result Value Ref Range Status   Specimen Description   Final    URINE, CLEAN CATCH Performed at [facility], [place] [address], [place], [place] [address]    Special Requests   Final    NONE Performed at [facility], [place] [address], [place], [place] [address]    Culture MULTIPLE SPECIES PRESENT, SUGGEST RECOLLECTION (A)  Final   Report Status 11/17/2017 FINAL  Final  Culture, blood (routine x 2)     Status: None (Preliminary result)   Collection Time: 11/16/17  9:02 PM  Result Value Ref Range Status   Specimen Description   Final    BLOOD RIGHT HAND Performed at [place] [address], [place], [place] [address]    Special Requests   Final    BOTTLES DRAWN AEROBIC AND ANAEROBIC Blood Culture adequate volume Performed at [place] [address], [place], [place] [address]    Culture   Final    NO GROWTH 3 DAYS Performed at [facility], [place] [address] [place], [place] [address]    Report Status PENDING  Incomplete  Culture, blood (routine x 2)     Status: None (Preliminary result)   Collection Time: 11/17/17 12:18 AM  Result Value Ref Range Status   Specimen Description   Final    BLOOD RIGHT HAND Performed at [place] [address] [place],  [address]    Special Requests   Final    BOTTLES DRAWN AEROBIC AND ANAEROBIC Blood Culture adequate volume Performed at [facility]  [place] [address], [place], [place] [address]    Culture   Final    NO GROWTH 3 DAYS Performed at [facility], [place] [address] [place], [place] [address]    Report Status PENDING  Incomplete  Culture, body fluid-bottle     Status: None (Preliminary result)   Collection Time: 11/17/17  5:13 PM  Result Value Ref Range Status   Specimen Description PERITONEAL  Final   Special Requests BOTTLES DRAWN AEROBIC AND ANAEROBIC 10CC  Final   Gram Stain   Final    GRAM POSITIVE COCCI IN CHAINS IN BOTH AEROBIC AND ANAEROBIC BOTTLES CRITICAL RESULT CALLED TO, READ BACK BY AND VERIFIED WITH: EDWARDS RN AT 3244 ON [id number] BY SJW Performed at [facility], [place] [address], [place], [place] [address]    Culture GRAM POSITIVE COCCI IN CHAINS  Final   Report Status PENDING  Incomplete  Gram stain     Status: None   Collection Time: 11/17/17  5:13 PM  Result Value Ref Range Status   Specimen Description PERITONEAL  Final   Special Requests NONE  Final   Gram Stain   Final    FEW WBC PRESENT, PREDOMINANTLY PMN NO ORGANISMS SEEN Performed at [facility], [place] [address] [place], [place] [address]    Report Status 11/17/2017 FINAL  Final  Culture, body fluid-bottle     Status: None (Preliminary result)   Collection Time: 11/18/17  1:54 PM  Result Value Ref Range Status   Specimen Description FLUID RIGHT PLEURAL  Final   Special Requests BOTTLES DRAWN AEROBIC AND ANAEROBIC  Final   Culture   Final    NO GROWTH 1 DAY Performed at [facility], [place] [address], [place], [place] [address]    Report Status PENDING  Incomplete  Gram stain     Status: None   Collection Time: 11/18/17  1:54 PM  Result Value Ref Range Status   Specimen Description FLUID RIGHT PLEURAL  Final   Special Requests NONE  Final   Gram Stain   Final    RARE WBC PRESENT, PREDOMINANTLY MONONUCLEAR NO ORGANISMS SEEN Performed at [facility], [place] [address], [place], [place] [address]    Report Status 11/19/2017 FINAL  Final    Radiology Studies: Dg Chest 1 View  Result Date: 11/20/2017 CLINICAL DATA:   Right thoracentesis. EXAM: CHEST  1 VIEW COMPARISON:  11/16/2017, 11/20/2017 FINDINGS: Small left pleural effusion. No significant right pleural effusion. No pneumothorax. No focal consolidation. Stable cardiomediastinal silhouette. Right-sided PICC line with the tip projecting over the SVC. No acute osseous abnormality. IMPRESSION: 1. No right pneumothorax status post thoracentesis. Electronically Signed   By: Kathreen Devoid   On: 11/20/2017 11:54   Dg Chest Port 1 View  Result Date: 11/20/2017 CLINICAL DATA:  Shortness of breath. EXAM: PORTABLE CHEST 1 VIEW COMPARISON:  Single-view of the chest 11/19/2017, 11/18/2017 and 11/11/2017. FINDINGS: Left greater than right pleural effusions and basilar atelectasis appear unchanged since the most recent examination. No pneumothorax. Heart size is normal. Right PICC remains in place. Surgical clips left axilla noted. IMPRESSION: No change in left greater than right pleural effusions and basilar atelectasis since the most recent study. Electronically Signed   By: Inge Rise M.D.   On: 11/20/2017 08:46   Dg Chest Port 1 View  Result Date: 11/19/2017 CLINICAL DATA:  Dyspnea EXAM: PORTABLE CHEST 1 VIEW COMPARISON:  Chest radiograph from  one day prior. FINDINGS: Right PICC terminates in lower third of the SVC. Surgical clips overlie the left chest wall. Stable cardiomediastinal silhouette with normal heart size. No pneumothorax. Small bilateral pleural effusions are probably stable accounting for differences in positioning. No pulmonary edema. Left basilar atelectasis is stable. IMPRESSION: 1. Small bilateral pleural effusions, probably stable accounting for differences in positioning. 2. Left basilar atelectasis, stable. Electronically Signed   By: Ilona Sorrel M.D.   On: 11/19/2017 08:00   US Thoracentesis Asp Pleural Space W/img Guide  Result Date: 11/20/2017 INDICATION: Patient with history of stage IV lobular breast cancer, recent small bowel  resection/transverse colostomy, ascites, bilateral pleural effusions, rectal mass. Request made for diagnostic and therapeutic left thoracentesis. EXAM: ULTRASOUND GUIDED DIAGNOSTIC AND THERAPEUTIC LEFT THORACENTESIS MEDICATIONS: None COMPLICATIONS: None immediate. PROCEDURE: An ultrasound guided thoracentesis was thoroughly discussed with the patient and questions answered. The benefits, risks, alternatives and complications were also discussed. The patient understands and wishes to proceed with the procedure. Written consent was obtained. Ultrasound was performed to localize and mark an adequate pocket of fluid in the left chest. The area was then prepped and draped in the normal sterile fashion. 1% Lidocaine was used for local anesthesia. Under ultrasound guidance a 6 Fr Safe-T-Centesis catheter was introduced. Thoracentesis was performed. The catheter was removed and a dressing applied. FINDINGS: A total of approximately 170 cc of yellow fluid was removed. Samples were sent to the laboratory as requested by the clinical team. IMPRESSION: Successful ultrasound guided diagnostic and therapeutic left thoracentesis yielding 170 cc of pleural fluid. Read by: Rowe Robert, PA-C Electronically Signed   By: Markus Daft M.D.   On: 11/20/2017 11:28    Scheduled Meds: . acetaminophen  1,000 mg Oral Q8H  . exemestane  25 mg Oral QPC breakfast  . famotidine  20 mg Oral BID  . feeding supplement (ENSURE ENLIVE)  237 mL Oral TID WC  . feeding supplement (PRO-STAT SUGAR FREE 64)  30 mL Oral BID  . furosemide  80 mg Intravenous Q12H  . gabapentin  300 mg Oral TID  . levothyroxine  100 mcg Oral QAC breakfast  . lidocaine      . lip balm  1 application Topical BID  . LORazepam  0.5 mg Intravenous Q24H  . methocarbamol  500 mg Oral Q8H  . multivitamin with minerals  1 tablet Oral Daily   Continuous Infusions: . Marland KitchenTPN (CLINIMIX-E) Adult 90 mL/hr at 11/20/17 1826   And  . Fat emulsion 240 mL (11/20/17 1707)  .  piperacillin-tazobactam (ZOSYN)  IV 3.375 g (11/20/17 1823)    LOS: 17 days   [name], DO [facility] Pager [phone]  If 7PM-7AM, please contact night-coverage [email or web address] Password [facility] 11/20/2017, 6:55 PM

## 2017-11-20 NOTE — Progress Notes (Signed)
PT Cancellation Note  Patient Details Name: Sonia Hill MRN: [id number] DOB: 02-25-1956   Cancelled Treatment:    Reason Eval/Treat Not Completed: Fatigue/lethargy limiting ability to participate. Pt sound asleep. Sister stated pt had just walked prior to falling asleep. Will check back another day.    Weston Anna, MPT Pager: [phone]

## 2017-11-20 NOTE — Procedures (Signed)
Ultrasound-guided diagnostic and therapeutic left thoracentesis performed yielding 170 cc of yellow fluid. No immediate complications. Follow-up chest x-ray pending. The fluid was sent to the lab for preordered studies.

## 2017-11-21 LAB — CBC WITH DIFFERENTIAL/PLATELET
BASOS PCT: 0 %
Basophils Absolute: 0.1 10*3/uL (ref 0.0–0.1)
Eosinophils Absolute: 0.1 10*3/uL (ref 0.0–0.7)
Eosinophils Relative: 1 %
HEMATOCRIT: 26.1 % — AB (ref 36.0–46.0)
Hemoglobin: 8.6 g/dL — ABNORMAL LOW (ref 12.0–15.0)
LYMPHS PCT: 6 %
Lymphs Abs: 0.9 10*3/uL (ref 0.7–4.0)
MCH: 28.3 pg (ref 26.0–34.0)
MCHC: 33 g/dL (ref 30.0–36.0)
MCV: 85.9 fL (ref 78.0–100.0)
MONO ABS: 1.4 10*3/uL — AB (ref 0.1–1.0)
Monocytes Relative: 9 %
NEUTROS ABS: 13.9 10*3/uL — AB (ref 1.7–7.7)
NEUTROS PCT: 84 %
Platelets: 800 10*3/uL — ABNORMAL HIGH (ref 150–400)
RBC: 3.04 MIL/uL — AB (ref 3.87–5.11)
RDW: 15.5 % (ref 11.5–15.5)
WBC: 16.4 10*3/uL — AB (ref 4.0–10.5)

## 2017-11-21 LAB — COMPREHENSIVE METABOLIC PANEL
ALBUMIN: 3 g/dL — AB (ref 3.5–5.0)
ALT: 31 U/L (ref 14–54)
AST: 39 U/L (ref 15–41)
Alkaline Phosphatase: 293 U/L — ABNORMAL HIGH (ref 38–126)
Anion gap: 12 (ref 5–15)
BILIRUBIN TOTAL: 1 mg/dL (ref 0.3–1.2)
BUN: 42 mg/dL — AB (ref 6–20)
CHLORIDE: 92 mmol/L — AB (ref 101–111)
CO2: 29 mmol/L (ref 22–32)
CREATININE: 0.9 mg/dL (ref 0.44–1.00)
Calcium: 9.2 mg/dL (ref 8.9–10.3)
GFR calc Af Amer: >60 mL/min (ref >60)
GLUCOSE: 106 mg/dL — AB (ref 65–99)
Potassium: 3.6 mmol/L (ref 3.5–5.1)
Sodium: 133 mmol/L — ABNORMAL LOW (ref 135–145)
TOTAL PROTEIN: 6.1 g/dL — AB (ref 6.5–8.1)

## 2017-11-21 LAB — PH, BODY FLUID: pH, Body Fluid: 7.5

## 2017-11-21 LAB — AMYLASE, PLEURAL OR PERITONEAL FLUID: AMYLASE FL: 40 U/L

## 2017-11-21 LAB — PHOSPHORUS: Phosphorus: 3.8 mg/dL (ref 2.5–4.6)

## 2017-11-21 LAB — LIPASE, FLUID: LIPASE-FLUID: 21 U/L

## 2017-11-21 LAB — MAGNESIUM: Magnesium: 2 mg/dL (ref 1.7–2.4)

## 2017-11-21 MED ORDER — CLINIMIX E/DEXTROSE (5/15) 5 % IV SOLN
INTRAVENOUS | Status: AC
  Administered 2017-11-21: 18:00:00 via INTRAVENOUS
  Filled 2017-11-21: qty 1000

## 2017-11-21 MED ORDER — SACCHAROMYCES BOULARDII 250 MG PO CAPS
250.0000 mg | ORAL_CAPSULE | Freq: Two times a day (BID) | ORAL | Status: DC
  Administered 2017-11-21 – 2017-11-24 (×7): 250 mg via ORAL
  Filled 2017-11-21 (×7): qty 1

## 2017-11-21 MED ORDER — POTASSIUM CHLORIDE 10 MEQ/100ML IV SOLN
10.0000 meq | INTRAVENOUS | Status: AC
  Administered 2017-11-21 (×2): 10 meq via INTRAVENOUS
  Filled 2017-11-21 (×2): qty 100

## 2017-11-21 MED ORDER — FAT EMULSION PLANT BASED 20 % IV EMUL
240.0000 mL | INTRAVENOUS | Status: AC
  Administered 2017-11-21: 240 mL via INTRAVENOUS
  Filled 2017-11-21: qty 250

## 2017-11-21 NOTE — Progress Notes (Signed)
[facility] Progress Note  13 Days Post-Op  Subjective: CC:  Tearful, tired of being in the hospital and trying to be as independent as possible. Pt has been eating full meals TID + protein shakes for over 24 hours. Calorie count felt to be inaccurate, as she was not eating the food brought to her room on a tray, her family was bring her meals. Having flatus and stool in ostomy pouch. Mobilizing with therapies.   Objective: Vital signs in last 24 hours: Temp:  [97.8 F (36.6 C)-98.5 F (36.9 C)] 97.8 F (36.6 C) (06/11 0400) Pulse Rate:  [110-116] 115 (06/11 0400) Resp:  [18] 18 (06/11 0400) BP: (119-134)/(64-102) 126/72 (06/11 0400) SpO2:  [96 %-100 %] 100 % (06/11 0400) Weight:  [53.7 kg (118 lb 6.4 oz)] 53.7 kg (118 lb 6.4 oz) (06/11 0500) Last BM Date: 11/20/17(colostomy)  Intake/Output from previous day: 06/10 0701 - 06/11 0700 In: 1514.5 [I.V.:1364.5; IV Piggyback:150] Out: 4270 [Urine:3000; Stool:550] Intake/Output this shift: Total I/O In: -  Out: 400 [Urine:300; Stool:100]  PE: GGen: Alert, NAD, pleasant Pulm: Normal effort WCB:JSEG,BTDVVOHYW,VPXTGGYIR,SWNIOEV dressing c/d/i. G-tube clamped, colostomy pouch in place with gasand liquid stoolin pouch, stoma viable.  Skin: warm and dry, no rashes  Psych: A&Ox3    Lab Results:  Recent Labs    11/20/17 0444 11/21/17 0432  WBC 15.6* 16.4*  HGB 8.7* 8.6*  HCT 25.6* 26.1*  PLT 636* 800*   BMET Recent Labs    11/20/17 0444 11/21/17 0432  NA 132* 133*  K 3.2* 3.6  CL 92* 92*  CO2 29 29  GLUCOSE 103* 106*  BUN 39* 42*  CREATININE 0.89 0.90  CALCIUM 9.1 9.2   PT/INR No results for input(s): LABPROT, INR in the last 72 hours. CMP     Component Value Date/Time   NA 133 (L) 11/21/2017 0432   NA 134 (L) 12/28/2016 1503   K 3.6 11/21/2017 0432   K 4.0 12/28/2016 1503   CL 92 (L) 11/21/2017 0432   CL 98 12/03/2012 1419   CO2 29 11/21/2017 0432   CO2 25 12/28/2016 1503   GLUCOSE  106 (H) 11/21/2017 0432   GLUCOSE 96 12/28/2016 1503   GLUCOSE 89 12/03/2012 1419   BUN 42 (H) 11/21/2017 0432   BUN 12.0 12/28/2016 1503   CREATININE 0.90 11/21/2017 0432   CREATININE 0.81 09/13/2017 1157   CREATININE 0.8 12/28/2016 1503   CALCIUM 9.2 11/21/2017 0432   CALCIUM 9.7 12/28/2016 1503   PROT 6.1 (L) 11/21/2017 0432   PROT 6.5 12/28/2016 1503   ALBUMIN 3.0 (L) 11/21/2017 0432   ALBUMIN 3.9 12/28/2016 1503   AST 39 11/21/2017 0432   AST 25 09/13/2017 1157   AST 22 12/28/2016 1503   ALT 31 11/21/2017 0432   ALT 12 09/13/2017 1157   ALT 20 12/28/2016 1503   ALKPHOS 293 (H) 11/21/2017 0432   ALKPHOS 76 12/28/2016 1503   BILITOT 1.0 11/21/2017 0432   BILITOT 0.6 09/13/2017 1157   BILITOT 0.50 12/28/2016 1503   GFRNONAA >60 11/21/2017 0432   GFRNONAA >60 09/13/2017 1157   GFRAA >60 11/21/2017 0432   GFRAA >60 09/13/2017 1157   Lipase     Component Value Date/Time   LIPASE 37 11/03/2017 1828       Studies/Results: Dg Chest 1 View  Result Date: 11/20/2017 CLINICAL DATA:  Right thoracentesis. EXAM: CHEST  1 VIEW COMPARISON:  11/16/2017, 11/20/2017 FINDINGS: Small left pleural effusion. No significant right pleural effusion. No pneumothorax.  No focal consolidation. Stable cardiomediastinal silhouette. Right-sided PICC line with the tip projecting over the SVC. No acute osseous abnormality. IMPRESSION: 1. No right pneumothorax status post thoracentesis. Electronically Signed   By: Kathreen Devoid   On: 11/20/2017 11:54   Dg Chest Port 1 View  Result Date: 11/20/2017 CLINICAL DATA:  Shortness of breath. EXAM: PORTABLE CHEST 1 VIEW COMPARISON:  Single-view of the chest 11/19/2017, 11/18/2017 and 11/11/2017. FINDINGS: Left greater than right pleural effusions and basilar atelectasis appear unchanged since the most recent examination. No pneumothorax. Heart size is normal. Right PICC remains in place. Surgical clips left axilla noted. IMPRESSION: No change in left greater than  right pleural effusions and basilar atelectasis since the most recent study. Electronically Signed   By: Inge Rise M.D.   On: 11/20/2017 08:46   US Thoracentesis Asp Pleural Space W/img Guide  Result Date: 11/20/2017 INDICATION: Patient with history of stage IV lobular breast cancer, recent small bowel resection/transverse colostomy, ascites, bilateral pleural effusions, rectal mass. Request made for diagnostic and therapeutic left thoracentesis. EXAM: ULTRASOUND GUIDED DIAGNOSTIC AND THERAPEUTIC LEFT THORACENTESIS MEDICATIONS: None COMPLICATIONS: None immediate. PROCEDURE: An ultrasound guided thoracentesis was thoroughly discussed with the patient and questions answered. The benefits, risks, alternatives and complications were also discussed. The patient understands and wishes to proceed with the procedure. Written consent was obtained. Ultrasound was performed to localize and mark an adequate pocket of fluid in the left chest. The area was then prepped and draped in the normal sterile fashion. 1% Lidocaine was used for local anesthesia. Under ultrasound guidance a 6 Fr Safe-T-Centesis catheter was introduced. Thoracentesis was performed. The catheter was removed and a dressing applied. FINDINGS: A total of approximately 170 cc of yellow fluid was removed. Samples were sent to the laboratory as requested by the clinical team. IMPRESSION: Successful ultrasound guided diagnostic and therapeutic left thoracentesis yielding 170 cc of pleural fluid. Read by: Rowe Robert, PA-C Electronically Signed   By: Markus Daft M.D.   On: 11/20/2017 11:28    Anti-infectives: Anti-infectives (From admission, onward)   Start     Dose/Rate Route Frequency Ordered Stop   11/17/17 1030  piperacillin-tazobactam (ZOSYN) IVPB 3.375 g     3.375 g 12.5 mL/hr over 240 Minutes Intravenous Every 8 hours 11/17/17 1016     11/09/17 0000  cefoTEtan (CEFOTAN) 2 g in sodium chloride 0.9 % 100 mL IVPB     2 g 200 mL/hr over 30  Minutes Intravenous Every 12 hours 11/08/17 1748 11/09/17 0021   11/08/17 1021  sodium chloride 0.9 % with cefoTEtan (CEFOTAN) ADS Med    Note to Pharmacy:  Marchia Meiers   : cabinet override      11/08/17 1021 11/08/17 2229   11/08/17 0600  cefoTEtan (CEFOTAN) 2 g in sodium chloride 0.9 % 100 mL IVPB  Status:  Discontinued    Note to Pharmacy:  Pharmacy may adjust dose strength for optimal dosing.   Send with patient on call to the OR.  Anesthesia to complete antibiotic administration <25min prior to incision per [facility]   2 g 200 mL/hr over 30 Minutes Intravenous On call to O.R. 11/07/17 1234 11/07/17 1433   11/08/17 0600  cefoTEtan in Dextrose 5% (CEFOTAN) IVPB 2 g     2 g Intravenous On call to O.R. 11/07/17 1505 11/08/17 1207   11/07/17 1500  cefoTEtan in Dextrose 5% (CEFOTAN) IVPB 2 g  Status:  Discontinued     2 g Intravenous On call to  O.R. 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 -oncologist: Dr. Magrinat;resumedexemestane, holdingabemaciclib until after discharge.  pSBO 2/2 metastatic breast CA w/ known pelvic metastasis S/P diagnostic laparoscopy, LOA, SBR, insertion gastrostomy tube, partial colectomy new colostomy 11/08/17 Dr. Johney Maine - POD#13 - PO pain control - scheduled tylenol,home gabapentin/xanax,po oxycodone PRN. -VAC removed; daily wet-to-dry dressing changes to midline wound - having bowel function,G-tube clamped -Soft diet - pepcid for reflux - mobilize/IS  Leukocytosis - UA negative, CT abd/pelvis not impressive, atelectasis w/?LLL PNA but CXR looks stable compared  to 5 d ago, NGTD on blood CX or body fluid CX. Per medicine.  malnutirion - 48 h calorie count pt only meeting ~56% of nutritional needs; I do not this this was accurate, patient has been writing down her intake and it has increased.   FEN: SOFT DIET + proteinshakes TID;continuecyclicTNA; hypervolemic hyponatremia  VTE: SCD's, Lovenox ID: perioperative cefotetan;Zosyn 6/9>> empirically for possible PNA as no other source of infection can be identified at thsi time.  Foley: removed 5/31 (POD#2) Follow up: Dr. Johney Maine, Dr. [name]   Plan: D/C TNA. Start probiotic. Off unit privileges. Will discuss venous access with MD.     LOS: 18 days    Jill Alexanders , [facility] 11/21/2017, 8:26 AM Pager: [phone] Consults: [phone] Mon-Fri 7:00 am-4:30 pm Sat-Sun 7:00 am-11:30 am

## 2017-11-21 NOTE — Progress Notes (Signed)
PHARMACY - ADULT TOTAL PARENTERAL NUTRITION CONSULT NOTE   Pharmacy Consult for TPN Indication: Intolerance to enteral feeding   Patient Measurements: Height: 5' (152.4 cm) Weight: 118 lb 6.4 oz (53.7 kg) IBW/kg (Calculated) : 45.5 TPN AdjBW (KG): 49 Body mass index is 23.12 kg/m.   Insulin Requirements: none, DC insulin on 6/3  Current Nutrition: GT clamped 6/3 - Pt reports she eats small bites of a variety of foods at meals + [employer] Protein shakes 2-3 / day (each provides 16 g protein) - Add Prostat supplement BID (provides 15 g protein, 100 kcals).  Charted as given on 6/7 and 6/8. - 48 hour calorie count in progress  IVF: none ordered  Central access: PICC 5/25 TPN start date: 5/28  ASSESSMENT                                                                                                          HPI:  Patient has metastatic breast cancer and partial small bowel obstruction on full liquids for 4 months, now intolerance to clear liquids.  Significant events:  5/29 surgery 5/31 advanced to CLD but did not tolerate 6/3 GT clamped, FLD ordered 6/5 advanced to soft diet 6/6 request to cycle TPN as pt to go home with TPN 6/7 Reduce rate of TPN from goal rate 1.8L to 1L since pt is eating and to minimize volume overload.  Paracentesis completed, 570 mL out.   6/8 Thoracentesis completed, 230 mL out. 6/10:  plan L Thoracentesis, Albumin discontinued, good diuresis 6/11: per surgery cont TPN while in hospital, potentially go home without it  Today:   Glucose - at goal of <150 (during peak cyclic TPN infusion)   Electrolytes - Na low but improved to 133, K 3.6, below goal > 4.  Mag, Phos WNL.   HypoN:  see nephrologist note (6/3, Hypervolemic hyponatremia) IV albumin & salt tabs d/cd 6/3; restarted IV albumin and increased IV lasix 6/7 for massive anasarca, pitting edema.  - 6/10 discontinued Albumin    Renal - WNL, SCr 0.90 (previously stable ~ 0.5)  LFTs - AST/ALT  remain WNL, Tbili now wnl, Alk phos remains elevated,   TGs - 152, 157 ( 6/3), 148 (6/10)  Prealbumin - 10.7 ( 5/29);  <5 (6/3), 10.8 (6/10)   NUTRITIONAL GOALS                                                                                             RD recs: 5/30 1700-1800 Kcal and 85-95 gm protein Clinimix E 5/15 at a goal rate of 75 ml/hr + 20% fat emulsion at 48m/hr to provide: 90 g/day protein, 1758 Kcal/day. - Reduced rate Clinimix E  5/15 1L / 24 hrs + 20% fat emulsion at 240 ml / 24 hrs to provide: 50 g/day protein, 1190 Kcal/day.   PLAN                                                                                                         Now:  K 32mq IV x 2  At 1800 today:  Continue to cycle Clinimix E 5/15 1L over 12 hours: 50 ml x 1 hour then 90 ml/hr x 10 hours then 581mx 1 hour then stop  20% fat emulsion at 2076mr x 12 hours  PO standard multivitamins and trace elements.  TPN lab panels on Mondays & Thursdays.  BMET tomorrow, follow up K repletion  F/u daily.  EllDolly Riash 11/21/2017, 11:50 AM Pager [phone]

## 2017-11-21 NOTE — Progress Notes (Signed)
PT Cancellation Note  Patient Details Name: Sonia Hill MRN: [id number] DOB: November 20, 1955   Cancelled Treatment:     pt out of room for off floor visit.  Pt has been evaluated with rec for [facility] PT and HH Aide.  Pt is able to amb with assist >250 feet.    Rica Koyanagi  PTA [facility]  Acute  Rehab Pager      [phone]

## 2017-11-21 NOTE — Progress Notes (Signed)
OT Cancellation Note  Patient Details Name: Sonia Hill MRN: [id number] DOB: 1956-01-05   Cancelled Treatment:    Reason Eval/Treat Not Completed: Other (comment)  Pt had gone to visit the garden and was not back in room Will check on pt next day  Kari Baars, [place] Mccallum D 11/21/2017, 1:45 PM

## 2017-11-21 NOTE — Progress Notes (Signed)
PROGRESS NOTE    Sonia Hill  GYI:[id number] DOB: 1956/01/01 DOA: 11/03/2017 PCP: [name], MD   Brief Narrative:  The patient is a 59 female with metastatic breast cancer who sees Dr. Elza Rafter, Hx of Colostomy + rectal mass 04/2017-complicated by SBO-recurrent SBO 06/2017 and currently on liquid diet at home. She was admitted with severe excruciating abdominal pain no ostomy output. CT Abdomen showed worsening cancer. Underwent Surgery on 5/29 and had Diagnostic Laparoscopy, Lysis of Adhesions, Small Bowel Resection, Insertion of Gastrotomy Tube and Partial Colectomy with new Colostomy. Currently on TPN and diet being advanced to soft Diet and is doing better but WBC started Elevating so will pan-culture. CT of Abdomen and CXR show possible PNA so patient was started on Empiric Zosyn. Patient underwent Thoracentesis x 2 (Left and Right) and Paracentesis which found Enterococcus SBP; She remains grossly volume overloaded but is improving slowly with diuresis and is has diurese 12 liters so far.   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    Ascites   New onset left bundle branch block (LBBB)   Metastatic breast cancer to small intestine causing SBO s/p SB resection 11/08/2017   Metastatic breast cancer to colostomy s/p colectomy/colostomy 11/08/2017   Cachexia ([place])   Malnutrition of moderate degree   Anemia  Malignant bowel obstruction/small bowel obstruction secondary to Breast Cancer Metastasis status post small bowel resection  11/08/2017 -Patient noted to have no significant improvement with small bowel obstruction and subsequently underwent laparoscopic diagnostic small bowel resection, insertion of gastrostomy tube, lysis of adhesion, partial colectomy with new colostomy per Dr. Johney Maine 11/08/2017.   -NG tube has been discontinued per General Surgery.   -IV Robaxin 500 mg q8hhas been added to patient's regimen.   -Patient noted to have some bowel function.   -Tolerating Soft Diet but also will have TNA cycled.  -TNA per General Surgery and even though patient is tolerating po they feel she is not consistently meeting Nutritional needs so are anticipating TNA at D/C; General Surgery planning cyclic TNA -Pain regimen has been adjusted per General Surgery; Continue current regimen for now   Hyponatremia, improved  -Initially felt likely secondary to hypervolemic hyponatremia plus or minus SIADH and now likley from massive volume overloaded  -Patient noted to have lower extremity edema as well as low albumin levels.   -Sodium is now 133 and IV Diuresis with 80 mg BID started and will continue  -TSH within normal limits.   -Cortisol level within normal limits.  -Nephrology consulted to assessed the patient and feel hyponatremia may be secondary to hypervolemia with anasarca.   -Patient was started on IV albumin and low-dose salt tablets as well as compression stockings but Salt Tablets have been stopped now; IV Albumin Stopped today -Sodium levels currently went from 125 -> 129 -> 131 -> 132 -> 133 -Transition to oral Lasix 80 mg BID once improved but continue IV Furosemide 80 mg BID as she is massively volume overloaded for now -Nephrology following and appreciate input and recommendations.  -C/w Compression Stockings  Hypokalemia/hypocalcemia/hypomagnesemia -Continue TPN/TNA per General Surgery  -  Electrolytes being repleted per pharmacy through [facility] currently   Hypothyroidism  -TSH 3.123.  IV Synthroid  changed to po  -C/w po 100 mcg po Daily.    Metastatic estrogen positive lobular breast cancer -Patient with metastatic spread to the spine and rectal area resulting in blockage and diverging colostomy.   -Oncology following and feel as if she is turning the corner -Dr. [name] recommending resuming Exemestane at this point and continue to hold Abemaciclib   Solitary kidney with Hydronephrosis -Status post Foley catheter placement.   -Renal function stable. Urology consulted and no further work-up needed during this hospitalization. -Outpatient follow-up with Urology.    Anxiety -Ativan as needed.  Severe protein calorie malnutrition -On TPN per General Surgery  -Nutrition is consulted and appreciate further evaluation recommendations -Continue Ensure Enlive 237 mL's p.o. 3 times daily with meals  Volume Overload/Anasarca -Patient with lower extremity edema, abdominal swelling and anasarca  -Likely component of hypoalbuminemia. -Resumed IV Lasix at 80 mg BID and will hold po doses  -Urine output not accurately recorded.   -Continue to MonitorStrict I's and O's.  Daily weights. -Patient is +7.816 Liters since Admission and Weight is now 118 (up from 108 on Admission) -Ordered Thoracentesis and Paracentesis and is improved from that end  New left bundle branch block -Noted on EKG the night of 11/08/2017.   -Patient denies any chest pain no shortness of breath.   -2D echo with a EF of 65 to 70%, no wall motion abnormalities, left ventricular diastolic function normal.   -Patient has been seen in consultation by Cardiology and no further ischemic work-up recommended at this time.   Singultus/Hiccups, improved -Likely secondary to malignant bowel obstruction.   -Patient on Reglan which has been placed 3 times daily at a lower dose per oncology but now Stopped.  -Patient given a dose of Thorazine however was having's some side effects from it and as such Thorazine has been  discontinued. -Patient given Cogentin 1 mg IV every 12 hours x2 doses.   -Clinical improvement with hiccups noted  Normocytic Anemia -Patient is status post 2 units packed red blood cells 11/12/2017.  -Patient denies any overt bleeding.   -Colostomy with small brown stool.  FOBT was positive.  Anemia panel has been ordered and looks like patient does have a severe iron deficiency anemia with iron level less than 5.   -IV Feraheme x1.  LDH, haptoglobin, hepatic panel not consistent with hemolysis.  CT abdomen and pelvis was done and is as below.   -Patient was seen in consultation by Gastroenterology who are recommending serial CBCs.  Transfuse as needed.   -Hb/Hct was stable at 9.9/28.4 and dropped slightly to 8.6/26.1 -Continue to Monitor for S/Sx of Bleeding -Repeat CBC in AM  Leukocytosis likely in the setting of PNA and SBP and suspect SBP given Enterococcus in Fluid Cx  -Worsening -WBC went from 9.6 -> 14.2 -> 17.3 -> 19.1 -> 18.9 -> 18.4 -> 15.6 -> 16.4 -Urinalysis Rechecked along with Urine Culture and Urinalysis Negative  -Last Urine Cx on 6/1 showed 30,000 CFU of Pseudomonas Aeruginosa; Repeat Cx showed Multiple Species Present  -Was Not on Abx but will start on Empiric Zosyn given possible Pneumonia noted on CT Scan of Abdomen and CXR -Repeat Blood Cultures showed NGTD at 4 days -CXR showed Left pleural effusion and basilar opacity are unchanged from chest radiograph 5 days ago. Basilar opacity likely secondary to atelectasis, sterility technically indeterminate by radiograph. Improved right pleural  effusion and atelectasis from prior exam. Nonew airspace disease. -Checked CT Abdomen and Pelvis showed Recent small bowel resection and transverse colostomy. Moderate volume of intra-abdominal ascites with slight peritoneal enhancement, but no well-defined abscess. No free air. Moderate bilateral pleural effusions and bibasilar airspace disease. On the right this is consistent with  compressive atelectasis, on the left may be atelectasis or pneumonia in the setting of leukocytosis. Whole body wall edema and anasarca. Enhancing rectal mass invading the uterus and possibly posterior wall of the bladder. Stable right hydronephrosis and proximal hydroureter. Possible lucent lesions within L1 and L3 vertebral bodies out or not seen on 07/10/2017 CT, and may represent osseous metastatic Disease -Ordered Thoracentesis and Paracentesis and done  -Paracentesis done and showed Cloudy Appearance with 2837 WBC's and 82 Neutrophils with other Cx studies showing ENTEROCOCCUS FAECIUM with Susceptibilities to Follow  -Left and Right Thoracentesis done  -C/w Empiric Zosyn and Repeat CBC in AM   Abnormal LFT's/Elevated Alk Phos,improving  -AST was 108, ALT was 108, and Alk Phos was 471; Now improved and AST is 39, ALT is 31 and Alk Phos is 293 -Likely from TNA/TPN -Continue to Monitor and Repeat CMP in AM   Hypokalemia -Patient's potassium level is 3.6 this AM -Continue to monitor and replete as necessary and being replaced by Pharmacy with TNA -Repeat CMP in a.m.  Hyperbilirubinemia -Mild at 1.3 and now improved at 1.0 -Continue to Monitor and Repeat CMP in AM  Thrombocytosis -Likely worsening in the setting of Infection with Enterococcus Faecium -Platelet Count went from 401 -> 549 -> 636 -> 800 -Continue to Monitor and repeat CBC in AM   DVT prophylaxis: SCDs Code Status: FULL CODE Family Communication: Discussed with family at bedside and husband over the phone Disposition Plan: Anticipate D/C Home when medically stable in next 48-72 hours hopefully if not sooner  Consultants:   General Surgery: [name] 11/04/2017  Palliative care: Dr. Rowe Pavy 10/30/2017  Cardiology: Dr.Skains 11/10/2017  Nephrology: Dr: Jonnie Finner 11/11/2017  Gastroenterology: Dr. Cristina Gong 11/12/2017  Urology: Dr. Alinda Money 11/13/2017  Pharmacy for TNA  Procedures: S/P diagnostic laparoscopy, LOA, SBR,  insertion gastrostomy tube, partial colectomy new colostomy 11/08/17 Dr. Johney Maine  Throacentesis Right - Drained 230 mL Thoracentesis Left - Drained 130 mL  Paracentesis - Removed 570 mL  Antimicrobials:  Anti-infectives (From admission, onward)   Start     Dose/Rate Route Frequency Ordered Stop   11/17/17 1030  piperacillin-tazobactam (ZOSYN) IVPB 3.375 g     3.375 g 12.5 mL/hr over 240 Minutes Intravenous Every 8 hours 11/17/17 1016     11/09/17 0000  cefoTEtan (CEFOTAN) 2 g in sodium chloride 0.9 % 100 mL IVPB     2 g 200 mL/hr over 30 Minutes Intravenous Every 12 hours 11/08/17 1748 11/09/17 0021   11/08/17 1021  sodium chloride 0.9 % with cefoTEtan (CEFOTAN) ADS Med    Note to Pharmacy:  Marchia Meiers   : cabinet override      11/08/17 1021 11/08/17 2229   11/08/17 0600  cefoTEtan (CEFOTAN) 2 g in sodium chloride 0.9 % 100 mL IVPB  Status:  Discontinued    Note to Pharmacy:  Pharmacy may adjust dose strength for optimal dosing.   Send with patient on call to the OR.  Anesthesia to complete antibiotic administration <71mn [facility]   2 g 200 mL/hr over 30 Minutes Intravenous On call to O.R. 11/07/17 1234 11/07/17 1433   11/08/17 0600  cefoTEtan in Dextrose 5% (  CEFOTAN) IVPB 2 g     2 g Intravenous On call to O.R. 11/07/17 1505 11/08/17 1207   11/07/17 1500  cefoTEtan in Dextrose 5% (CEFOTAN) IVPB 2 g  Status:  Discontinued     2 g Intravenous On call to O.R. 11/07/17 1433 11/07/17 1505     Subjective: Seen and examined and stated legs still swollen. Anxious to go home. No CP or SOB. No nausea or vomiting and had an ok night. States Purewick really helps. Surgery wrote for her to get some respite and was excited to go out and walk in the [employer]. No other concerns or complaints at this time.   Objective: Vitals:   11/20/17 1258 11/20/17 2023 11/21/17 0400 11/21/17 0500  BP: 131/73 128/65 126/72   Pulse: (!) 116 (!) 110 (!) 115   Resp:  18 18     Temp:  98.5 F (36.9 C) 97.8 F (36.6 C)   TempSrc:  Oral Oral   SpO2: 99% 96% 100%   Weight:    53.7 kg (118 lb 6.4 oz)  Height:        Intake/Output Summary (Last 24 hours) at 11/21/2017 1806 Last data filed at 11/21/2017 1610 Gross per 24 hour  Intake 1464.5 ml  Output 2900 ml  Net -1435.5 ml   Filed Weights   11/19/17 0426 11/20/17 0500 11/21/17 0500  Weight: 54.5 kg (120 lb 2.4 oz) 54.1 kg (119 lb 4.3 oz) 53.7 kg (118 lb 6.4 oz)   Examination: Physical Exam:  Constitutional: Caucasian female currently no acute distress.  Appears calm and excited that she is able to go outside to walk per general surgery's order Eyes: There are anicteric.  Lids and conjunctive are normal. ENMT: External ears and nose appear normal.  Grossly normal hearing Neck: Supple with no JVD Respiratory: Diminished to auscultation bilaterally with mild crackles.  No appreciable wheezing, rales, rhonchi.  Unlabored breathing is not using accessory muscles to breathe Cardiovascular: Tachycardic rate and rhythm.  No appreciable murmurs, rubs, gallops.  Has 2+ to 3+ lower extremity edema and still remains in a stocking Abdomen: Soft and tender slightly to palpation.  Remains distended with abdominal wall edema and is slightly worse today.  Colostomy bag in place with aggressive distention and some liquid stool.  Abdominal scarring is noted and she does have a G-tube in place GU: Deferred Musculoskeletal: No contractures or cyanosis noted.  No joint deformity noted.  Has a right PICC line in place Skin: Skin is warm and dry no appreciable rashes or lesions limited skin evaluation Neurologic: Cranial nerves II through XII grossly intact with no appreciable focal deficits. Psychiatric: Normal mood and pleasant affect.  Intact judgment and insight.  Patient is awake, alert, and oriented x3.  Data Reviewed: I have personally reviewed following labs and imaging studies  CBC: Recent Labs  Lab 11/17/17 0909  11/18/17 0413 11/19/17 0519 11/20/17 0444 11/21/17 0432  WBC 19.1* 18.9* 18.4* 15.6* 16.4*  NEUTROABS 17.2* 17.1* 16.6* 13.3* 13.9*  HGB 10.0* 10.0* 9.9* 8.7* 8.6*  HCT 28.5* 28.5* 28.4* 25.6* 26.1*  MCV 83.8 82.6 84.5 85.9 85.9  PLT 307 401* 549* 636* 591*   Basic Metabolic Panel: Recent Labs  Lab 11/17/17 0352 11/18/17 0413 11/19/17 0519 11/20/17 0444 11/21/17 0432  NA 125* 129* 131* 132* 133*  K 3.5 3.2* 3.6 3.2* 3.6  CL 90* 90* 91* 92* 92*  CO2 _0 GLUCOSE 100* 106* 119* 103* 106*  BUN  26* 29* 34* 39* 42*  CREATININE 0.52 0.67 0.92 0.89 0.90  CALCIUM 8.1* 8.6* 9.0 9.1 9.2  MG 1.7 2.1 2.2 2.1 2.0  PHOS 3.6 3.7 3.9 4.1 3.8   GFR: Estimated Creatinine Clearance: 46.6 mL/min (by C-G formula based on SCr of 0.9 mg/dL). Liver Function Tests: Recent Labs  Lab 11/17/17 0352 11/18/17 0413 11/19/17 0519 11/20/17 0444 11/21/17 0432  AST 37 31 30 35 39  ALT 53 36 _0 ALKPHOS 363* 323* 262* 303* 293*  BILITOT 1.0 1.3* 1.3* 1.0 1.0  PROT 4.7* 5.7* 6.1* 5.9* 6.1*  ALBUMIN 2.1* 3.0* 3.4* 3.3* 3.0*   No results for input(s): LIPASE, AMYLASE in the last 168 hours. No results for input(s): AMMONIA in the last 168 hours. Coagulation Profile: No results for input(s): INR, PROTIME in the last 168 hours. Cardiac Enzymes: No results for input(s): CKTOTAL, CKMB, CKMBINDEX, TROPONINI in the last 168 hours. BNP (last 3 results) No results for input(s): PROBNP in the last 8760 hours. HbA1C: No results for input(s): HGBA1C in the last 72 hours. CBG: No results for input(s): GLUCAP in the last 168 hours. Lipid Profile: Recent Labs    11/20/17 0444  TRIG 148   Thyroid Function Tests: No results for input(s): TSH, T4TOTAL, FREET4, T3FREE, THYROIDAB in the last 72 hours. Anemia Panel: No results for input(s): VITAMINB12, FOLATE, FERRITIN, TIBC, IRON, RETICCTPCT in the last 72 hours. Sepsis Labs: No results for input(s): PROCALCITON, LATICACIDVEN in the last  168 hours.  Recent Results (from the past 240 hour(s))  Culture, Urine     Status: Abnormal   Collection Time: 11/16/17 10:55 AM  Result Value Ref Range Status   Specimen Description   Final    URINE, CLEAN CATCH Performed at [facility], [place] [address], [place], [place] [address]    Special Requests   Final    NONE Performed at [facility], [place] [address] [place], [place] [address]    Culture MULTIPLE SPECIES PRESENT, SUGGEST RECOLLECTION (A)  Final   Report Status 11/17/2017 FINAL  Final  Culture, blood (routine x 2)     Status: None (Preliminary result)   Collection Time: 11/16/17  9:02 PM  Result Value Ref Range Status   Specimen Description   Final    BLOOD RIGHT HAND Performed at [place] [address], [place], [place] [address]    Special Requests   Final    BOTTLES DRAWN AEROBIC AND ANAEROBIC Blood Culture adequate volume Performed at [place] [address], [place], [place] [address]    Culture   Final    NO GROWTH 4 DAYS Performed at [facility], [place] [address], [place], [place] [address]    Report Status PENDING  Incomplete  Culture, blood (routine x 2)     Status: None (Preliminary result)   Collection Time: 11/17/17 12:18 AM  Result Value Ref Range Status   Specimen Description   Final    BLOOD RIGHT HAND Performed at [place] [address] [place], [place] [address]    Special Requests   Final    BOTTLES DRAWN AEROBIC AND ANAEROBIC Blood Culture adequate volume Performed at [place] [address], [place], [place] [address]    Culture   Final    NO GROWTH 4 DAYS Performed at [facility], [place] [address], [place],  [address]    Report Status PENDING  Incomplete  Culture, body fluid-bottle     Status: Abnormal (Preliminary result)   Collection Time: 11/17/17  5:13 PM  Result Value Ref Range  Status   Specimen Description PERITONEAL  Final   Special Requests BOTTLES DRAWN AEROBIC AND ANAEROBIC 10CC  Final   Gram Stain   Final    GRAM POSITIVE COCCI IN CHAINS IN BOTH AEROBIC AND ANAEROBIC BOTTLES CRITICAL RESULT CALLED TO, READ BACK BY AND VERIFIED WITH: EDWARDS RN AT 3546 ON [id number] BY SJW    Culture (A)  Final    ENTEROCOCCUS FAECIUM SUSCEPTIBILITIES TO FOLLOW Performed at [facility], [place] [address], [place], [place] [address]    Report Status PENDING  Incomplete  Gram stain     Status: None   Collection Time: 11/17/17  5:13 PM  Result Value Ref Range Status   Specimen Description PERITONEAL  Final   Special Requests NONE  Final   Gram Stain   Final    FEW WBC PRESENT, PREDOMINANTLY PMN NO ORGANISMS SEEN Performed at [facility], [place] [address] [place], [place] [address]    Report Status 11/17/2017 FINAL  Final  Culture, body fluid-bottle     Status: None (Preliminary result)   Collection Time: 11/18/17  1:54 PM  Result Value Ref Range Status   Specimen Description FLUID RIGHT PLEURAL  Final   Special Requests BOTTLES DRAWN AEROBIC AND ANAEROBIC  Final   Culture   Final    NO GROWTH 2 DAYS Performed at [facility], [place] [address] [place], [place] [address]    Report Status PENDING  Incomplete  Gram stain     Status: None   Collection Time: 11/18/17  1:54 PM  Result Value Ref Range Status   Specimen Description FLUID RIGHT PLEURAL  Final   Special Requests NONE  Final   Gram Stain   Final    RARE WBC PRESENT, PREDOMINANTLY MONONUCLEAR NO ORGANISMS SEEN Performed at [facility], [place] [address], [place], [place] [address]    Report Status 11/19/2017 FINAL  Final  Culture, body fluid-bottle     Status: None (Preliminary result)   Collection Time: 11/20/17 11:27 AM  Result Value Ref Range Status   Specimen Description FLUID  Final   Special Requests NONE  Final   Culture   Final    NO GROWTH 1 DAY Performed at [facility], [place] [address], [place],  [address]    Report Status PENDING  Incomplete    Radiology Studies: Dg Chest 1 View  Result Date: 11/20/2017 CLINICAL DATA:  Right thoracentesis. EXAM: CHEST  1 VIEW COMPARISON:  11/16/2017, 11/20/2017 FINDINGS: Small left pleural effusion. No significant right pleural effusion. No pneumothorax. No focal consolidation. Stable cardiomediastinal silhouette. Right-sided PICC line with the tip projecting over the SVC. No acute osseous abnormality. IMPRESSION: 1. No right pneumothorax status post thoracentesis. Electronically Signed   By: Kathreen Devoid   On: 11/20/2017 11:54   Dg Chest Port 1 View  Result Date: 11/20/2017 CLINICAL DATA:  Shortness of breath. EXAM: PORTABLE CHEST 1 VIEW COMPARISON:  Single-view of the chest 11/19/2017, 11/18/2017 and 11/11/2017. FINDINGS: Left greater than right pleural effusions and basilar atelectasis appear unchanged since the most recent examination. No pneumothorax. Heart size is normal. Right PICC remains in place. Surgical clips left axilla noted. IMPRESSION: No change in left greater than right pleural effusions and basilar atelectasis since the most recent study. Electronically Signed   By:  Inge Rise M.D.   On: 11/20/2017 08:46   US Thoracentesis Asp Pleural Space W/img Guide  Result Date: 11/20/2017 INDICATION: Patient with history of stage IV lobular breast cancer, recent small bowel resection/transverse colostomy, ascites, bilateral pleural effusions, rectal mass. Request made for diagnostic and therapeutic left thoracentesis. EXAM: ULTRASOUND GUIDED DIAGNOSTIC AND THERAPEUTIC LEFT THORACENTESIS MEDICATIONS: None COMPLICATIONS: None immediate. PROCEDURE: An ultrasound guided thoracentesis was thoroughly discussed with the patient and questions answered. The benefits, risks, alternatives and complications were also discussed. The patient understands and wishes to proceed with the procedure. Written consent was  obtained. Ultrasound was performed to localize and mark an adequate pocket of fluid in the left chest. The area was then prepped and draped in the normal sterile fashion. 1% Lidocaine was used for local anesthesia. Under ultrasound guidance a 6 Fr Safe-T-Centesis catheter was introduced. Thoracentesis was performed. The catheter was removed and a dressing applied. FINDINGS: A total of approximately 170 cc of yellow fluid was removed. Samples were sent to the laboratory as requested by the clinical team. IMPRESSION: Successful ultrasound guided diagnostic and therapeutic left thoracentesis yielding 170 cc of pleural fluid. Read by: Rowe Robert, PA-C Electronically Signed   By: Markus Daft M.D.   On: 11/20/2017 11:28    Scheduled Meds: . acetaminophen  1,000 mg Oral Q8H  . exemestane  25 mg Oral QPC breakfast  . famotidine  20 mg Oral BID  . feeding supplement (PRO-STAT SUGAR FREE 64)  30 mL Oral BID  . furosemide  80 mg Intravenous Q12H  . gabapentin  300 mg Oral TID  . levothyroxine  100 mcg Oral QAC breakfast  . lip balm  1 application Topical BID  . LORazepam  0.5 mg Intravenous Q24H  . methocarbamol  500 mg Oral Q8H  . multivitamin with minerals  1 tablet Oral Daily  . saccharomyces boulardii  250 mg Oral BID   Continuous Infusions: . Marland KitchenTPN (CLINIMIX-E) Adult 50 mL/hr at 11/21/17 1805   And  . Fat emulsion 240 mL (11/21/17 1804)  . piperacillin-tazobactam (ZOSYN)  IV Stopped (11/21/17 1529)    LOS: 18 days   [name], DO [facility] Pager [phone]  If 7PM-7AM, please contact night-coverage [email or web address] Password [facility] 11/21/2017, 6:06 PM

## 2017-11-21 NOTE — Progress Notes (Addendum)
Nutrition Follow-up  DOCUMENTATION CODES:   Non-severe (moderate) malnutrition in context of chronic illness  INTERVENTION:    TPN per pharmacy  Orgain supplements from home  Provide MVI daily  30 ml Prostat BID, each supplement provides 100 kcals and 15 grams protein.   NUTRITION DIAGNOSIS:   Moderate Malnutrition related to cancer and cancer related treatments, chronic illness as evidenced by moderate fat depletion, moderate muscle depletion.  Ongoing  GOAL:   Patient will meet greater than or equal to 90% of their needs  Progressing with PO intake  MONITOR:   Other (Comment), Skin, Labs, Weight trends, I & O's(TPN tolerance)  REASON FOR ASSESSMENT:   Consult New TPN/TNA  ASSESSMENT:   62 y.o. F with metastatic breast cancer currenlty undergoing treatment with rectal metastases admitted on 11/03/17 for small bowel obstruction. 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 PSH of diverting loop colostomy due to rectal mass - 2018.    5/29- laparoscopy, small bowel resection, insertion of G-tube, partial colectomy with new colostomy 5/31- diet advanced to clear liquids, but pt did not tolerate 6/3- G tube clamped, pt's diet advanced to full liquid 6/5- diet advanced to soft 6/6- cycle TPN, continue on soft diet 6/11: per surgery cont TPN while in hospital, potentially go home without it  Pt had calorie count ordered 6/7-6/9. She looked to be meeting ~56% of her calorie/protein needs judging by meal tray completion and supplements only. Pt reports family brought her food from the cafeteria daily and it was not recorded. Pt provided RD with compete list of intake from 6/7-6/10. Intake improved on 6/9-6/10 and appetite is progressing well. If pt's intake continues to progress, do not recommend at home TPN.   Discussed the importance of high calorie high protein intake for preservation of lean body mass and weight gain. Pt's goal is to  eat three melas per day and have Orgain supplements in between.  Pt concerned about quality of life and eating. Discussed trying not to count calories daily and encouraged pt to eat things she enjoys. Pt is aware that she has to introduce foods slowly back into her diet. She seems optimistic about doing so.   RD provided "Colostomy Nutrition Therapy" handout from the [employer] as a reminder. Advised patient on ways to reduce gas output such as eating meals and snacks at the same times everyday, avoiding acidic, fried, sugary, fatty, and greasy foods. Provided patient with a list of gas and odor producing foods and advised patient to reintroduce fiber slowly. Answered all questions. Teach back method used.   Pt currently on cyclic Clinimix E 1/09 1L over 12 hours: 50 ml x 1 hour then 90 ml/hr x 10 hours then 21ml x 1 hour then stop + 20% fat emulsion at 88ml/hr x 12 hours.   Weight noted to decrease 2 lb since last RD visit on 6/9 (120 lb to 118 lb). She continues with lasix.   Medications reviewed and include: 80 mg lasix, MVI with minerals  Labs reviewed: Na 133 (L)   Diet Order:   Diet Order           DIET SOFT Room service appropriate? Yes; Fluid consistency: Thin  Diet effective now          EDUCATION NEEDS:   Education needs have been addressed  Skin:  Skin Assessment: Reviewed RN Assessment  Last BM:  11/20/17-100 ml colostomy  Height:   Ht Readings  from Last 1 Encounters:  11/08/17 5' (1.524 m)    Weight:   Wt Readings from Last 1 Encounters:  11/21/17 118 lb 6.4 oz (53.7 kg)    Ideal Body Weight:  45.45 kg  BMI:  Body mass index is 23.12 kg/m.  Estimated Nutritional Needs:   Kcal:  1700-1800 kcal  Protein:  85-95 grams  Fluid:  >/= 1.6 L or per MD    Mariana Single RD, LDN Clinical Nutrition Pager # [phone]

## 2017-11-22 DIAGNOSIS — R601 Generalized edema: Secondary | ICD-10-CM

## 2017-11-22 LAB — COMPREHENSIVE METABOLIC PANEL
ALT: 34 U/L (ref 14–54)
AST: 39 U/L (ref 15–41)
Albumin: 2.9 g/dL — ABNORMAL LOW (ref 3.5–5.0)
Alkaline Phosphatase: 247 U/L — ABNORMAL HIGH (ref 38–126)
Anion gap: 11 (ref 5–15)
BILIRUBIN TOTAL: 0.7 mg/dL (ref 0.3–1.2)
BUN: 46 mg/dL — AB (ref 6–20)
CO2: 29 mmol/L (ref 22–32)
CREATININE: 0.91 mg/dL (ref 0.44–1.00)
Calcium: 9.1 mg/dL (ref 8.9–10.3)
Chloride: 93 mmol/L — ABNORMAL LOW (ref 101–111)
Glucose, Bld: 97 mg/dL (ref 65–99)
Potassium: 3.6 mmol/L (ref 3.5–5.1)
Sodium: 133 mmol/L — ABNORMAL LOW (ref 135–145)
TOTAL PROTEIN: 6.2 g/dL — AB (ref 6.5–8.1)

## 2017-11-22 LAB — CBC WITH DIFFERENTIAL/PLATELET
BASOS ABS: UNDETERMINED 10*3/uL (ref 0.0–0.1)
Band Neutrophils: UNDETERMINED %
Basophils Relative: UNDETERMINED %
Blasts: UNDETERMINED %
EOS ABS: UNDETERMINED 10*3/uL (ref 0.0–0.7)
Eosinophils Relative: UNDETERMINED %
HEMATOCRIT: UNDETERMINED % (ref 36.0–46.0)
Hemoglobin: UNDETERMINED g/dL (ref 12.0–15.0)
LYMPHS ABS: UNDETERMINED 10*3/uL (ref 0.7–4.0)
Lymphocytes Relative: UNDETERMINED %
MCH: UNDETERMINED pg (ref 26.0–34.0)
MCHC: UNDETERMINED g/dL (ref 30.0–36.0)
MCV: UNDETERMINED fL (ref 78.0–100.0)
MONOS PCT: UNDETERMINED %
Metamyelocytes Relative: UNDETERMINED %
Monocytes Absolute: UNDETERMINED 10*3/uL (ref 0.1–1.0)
Myelocytes: UNDETERMINED %
NEUTROS ABS: UNDETERMINED 10*3/uL (ref 1.7–7.7)
NRBC: UNDETERMINED /100{WBCs}
Neutrophils Relative %: UNDETERMINED %
Other: UNDETERMINED %
PLATELETS: UNDETERMINED 10*3/uL (ref 150–400)
PROMYELOCYTES RELATIVE: UNDETERMINED %
RBC MORPHOLOGY: UNDETERMINED
RBC: UNDETERMINED MIL/uL (ref 3.87–5.11)
RDW: UNDETERMINED % (ref 11.5–15.5)
Smear Review: UNDETERMINED
WBC MORPHOLOGY: UNDETERMINED
WBC: UNDETERMINED 10*3/uL (ref 4.0–10.5)

## 2017-11-22 LAB — CBC
HEMATOCRIT: 26.3 % — AB (ref 36.0–46.0)
Hemoglobin: 9.1 g/dL — ABNORMAL LOW (ref 12.0–15.0)
MCH: 29.6 pg (ref 26.0–34.0)
MCHC: 34.6 g/dL (ref 30.0–36.0)
MCV: 85.7 fL (ref 78.0–100.0)
PLATELETS: 886 10*3/uL — AB (ref 150–400)
RBC: 3.07 MIL/uL — AB (ref 3.87–5.11)
RDW: 15.4 % (ref 11.5–15.5)
WBC: 16.1 10*3/uL — AB (ref 4.0–10.5)

## 2017-11-22 LAB — CULTURE, BLOOD (ROUTINE X 2)
CULTURE: NO GROWTH
Culture: NO GROWTH
SPECIAL REQUESTS: ADEQUATE
SPECIAL REQUESTS: ADEQUATE

## 2017-11-22 LAB — CULTURE, BODY FLUID-BOTTLE

## 2017-11-22 LAB — DIFFERENTIAL
BASOS ABS: 0.1 10*3/uL (ref 0.0–0.1)
BASOS PCT: 0 %
EOS ABS: 0.1 10*3/uL (ref 0.0–0.7)
EOS PCT: 1 %
Lymphocytes Relative: 13 %
Lymphs Abs: 2 10*3/uL (ref 0.7–4.0)
Monocytes Absolute: 0.5 10*3/uL (ref 0.1–1.0)
Monocytes Relative: 3 %
NEUTROS PCT: 83 %
Neutro Abs: 13.4 10*3/uL — ABNORMAL HIGH (ref 1.7–7.7)

## 2017-11-22 LAB — CULTURE, BODY FLUID W GRAM STAIN -BOTTLE

## 2017-11-22 LAB — MAGNESIUM: MAGNESIUM: 2.1 mg/dL (ref 1.7–2.4)

## 2017-11-22 LAB — PHOSPHORUS: PHOSPHORUS: 4.6 mg/dL (ref 2.5–4.6)

## 2017-11-22 MED ORDER — MORPHINE SULFATE (PF) 2 MG/ML IV SOLN: 1.0000 mg | INTRAVENOUS | Status: DC | PRN

## 2017-11-22 NOTE — Progress Notes (Signed)
Occupational Therapy Treatment Patient Details Name: Sonia Hill MRN: [id number] DOB: 1956/03/08 Today's Date: 11/22/2017    History of present illness 62 year old female was admitted with abdomen distention. PMH:  metastatic breast CA, colostomy, arthritis and CKD.  s/p small bowel resection, insertion of g tube, lysis of adhesions, partial colectomy and new colostomy   OT comments  Pt in much better spirits this day!  Follow Up Recommendations  Supervision/Assistance - 24 hour    Equipment Recommendations  3 in 1 bedside commode    Recommendations for Other Services      Precautions / Restrictions Precautions Precautions: Fall Precaution Comments: L colostomy, G tube       Mobility Bed Mobility Overal bed mobility: Needs Assistance Bed Mobility: Sit to Supine;Supine to Sit Rolling: Supervision Sidelying to sit: Supervision       General bed mobility comments: leg lifter  Transfers Overall transfer level: Needs assistance   Transfers: Sit to/from Stand Sit to Stand: Min guard;Supervision Stand pivot transfers: Min guard;Supervision            Balance Overall balance assessment: Needs assistance           Standing balance-Leahy Scale: Fair                             ADL either performed or assessed with clinical judgement   ADL Overall ADL's : Needs assistance/impaired                     Lower Body Dressing: Sit to/from stand;Cueing for sequencing;Cueing for safety;With adaptive equipment Lower Body Dressing Details (indicate cue type and reason): AE education complete- reacher, sock aid, leg lifter and shoe horn Toilet Transfer: Stand-pivot;Regular Toilet;RW;Min guard   Toileting- Clothing Manipulation and Hygiene: Min guard;Sit to/from stand;Cueing for safety         General ADL Comments: pt in better spirits this day.  Pt able to use AE, as well as leg lifter during OT sesion and was pleased with increased I      Vision       Perception     Praxis      Cognition Arousal/Alertness: Awake/alert Behavior During Therapy: WFL for tasks assessed/performed Overall Cognitive Status: Within Functional Limits for tasks assessed                                               Frequency  Min 2X/week        Progress Toward Goals  OT Goals(current goals can now be found in the care plan section)        Plan Discharge plan remains appropriate       AM-PAC PT "6 Clicks" Daily Activity     Outcome Measure   Help from another person eating meals?: None Help from another person taking care of personal grooming?: A Little Help from another person toileting, which includes using toliet, bedpan, or urinal?: A Little Help from another person bathing (including washing, rinsing, drying)?: A Little Help from another person to put on and taking off regular upper body clothing?: A Little Help from another person to put on and taking off regular lower body clothing?: A Little 6 Click Score: 19    End of Session    OT Visit Diagnosis: Unsteadiness on feet (R26.81);Muscle  weakness (generalized) (M62.81)   Activity Tolerance Patient tolerated treatment well   Patient Left in bed;with call bell/phone within reach;with family/visitor present   Nurse Communication Mobility status        Time: 1005-1019 OT Time Calculation (min): 14 min  Charges: OT General Charges $OT Visit: 1 Visit OT Treatments $Self Care/Home Management : 8-22 mins  [place], [place]   Betsy Pries 11/22/2017, 10:51 AM

## 2017-11-22 NOTE — Progress Notes (Signed)
[place] NOTE   Pharmacy Consult for TPN Indication: Intolerance to enteral feeding   Patient Measurements: Height: 5' (152.4 cm) Weight: 117 lb (53.1 kg) IBW/kg (Calculated) : 45.5 TPN AdjBW (KG): 49 Body mass index is 22.85 kg/m.   Insulin Requirements: none, DC insulin on 6/3  Current Nutrition: GT clamped 6/3 - Pt reports she eats small bites of a variety of foods at meals + Orgain Protein shakes 2-3 / day (each provides 16 g protein) - Add Prostat supplement BID (provides 15 g protein, 100 kcals).  Charted as given on 6/7 and 6/8. - 48 hour calorie count in progress  IVF: none ordered  Central access: PICC 5/25 TPN start date: 5/28  ASSESSMENT                                                                                                          HPI:  Patient has metastatic breast cancer and partial small bowel obstruction on full liquids for 4 months, now intolerance to clear liquids.  Significant events:  5/29 surgery 5/31 advanced to CLD but did not tolerate 6/3 GT clamped, FLD ordered 6/5 advanced to soft diet 6/6 request to cycle TPN as pt to go home with TPN 6/7 Reduce rate of TPN from goal rate 1.8L to 1L since pt is eating and to minimize volume overload.  Paracentesis completed, 570 mL out.   6/8 Thoracentesis completed, 230 mL out. 6/10:  plan L Thoracentesis, Albumin discontinued, good diuresis 6/11: per surgery cont TPN while in hospital, potentially go home without it  Today:   Glucose - at goal of <150 (during peak cyclic TPN infusion)   Electrolytes - Na low but improved to 133, K 3.6,  Mag, Phos WNL.   HypoN:  see nephrologist note (6/3, Hypervolemic hyponatremia) IV albumin & salt tabs d/cd 6/3; restarted IV albumin and increased IV lasix 6/7 for massive anasarca, pitting edema.  - 6/10 discontinued Albumin    Renal - WNL, SCr 0.91 (previously stable ~ 0.5)  LFTs - AST/ALT remain WNL, Tbili now wnl,  Alk phos remains elevated,   TGs - 152, 157 ( 6/3), 148 (6/10)  Prealbumin - 10.7 ( 5/29);  <5 (6/3), 10.8 (6/10)   NUTRITIONAL GOALS                                                                                             RD recs: 5/30 1700-1800 Kcal and 85-95 gm protein Clinimix E 5/15 at a goal rate of 75 ml/hr + 20% fat emulsion at 58m/hr to provide: 90 g/day protein, 1758 Kcal/day. - Reduced rate Clinimix E 5/15 1L / 24 hrs +  20% fat emulsion at 240 ml / 24 hrs to provide: 50 g/day protein, 1190 Kcal/day.   PLAN                                                                                                           Per surgery, stop TPN, pt is consuming enough po to sustain her  Continue PO standard multivitamins and trace elements.  D/c TPN lab panels   Dolly Rias RPh 11/22/2017, 1:54 PM Pager [phone]

## 2017-11-22 NOTE — Progress Notes (Signed)
Sonia Hill   DOB:05/01/1956   EV#:[id number]   WEX#:[id number]  Subjective:  Sonia Hill is eating "everything I can get my hands on." specifically working on various puddings this evening which her family has brought her. She is delighted to be off TNA. Colostomy is working well. She is trying to deal with lower extremity edema and hopes to be able to get her legs wrapped. Eager to go home but understands peritoneal infection issue. Family in room  Objective: middle aged White woman sitting at bedside eating Vitals:   11/22/17 0557 11/22/17 1455  BP: 126/69 127/82  Pulse: (!) 106 (!) 116  Resp: 18 16  Temp: 97.9 F (36.6 C) 98.1 F (36.7 C)  SpO2: 100% 98%    Body mass index is 22.85 kg/m.  Intake/Output Summary (Last 24 hours) at 11/22/2017 1904 Last data filed at 11/22/2017 1600 Gross per 24 hour  Intake 1589.38 ml  Output 2000 ml  Net -410.62 ml   Abd: there is a leakage in her colostomy; being cleaned  CBG (last 3)  No results for input(s): GLUCAP in the last 72 hours.   Labs:  Lab Results  Component Value Date   WBC 16.1 (H) 11/22/2017   HGB 9.1 (L) 11/22/2017   HCT 26.3 (L) 11/22/2017   MCV 85.7 11/22/2017   PLT 886 (H) 11/22/2017   NEUTROABS 13.4 (H) 11/22/2017    '@LASTCHEMISTRY' @  Urine Studies No results for input(s): UHGB, CRYS in the last 72 hours.  Invalid input(s): UACOL, UAPR, USPG, UPH, UTP, UGL, UKET, UBIL, UNIT, UROB, [place], UEPI, UWBC, Junie Panning [place], [place], [place]  Basic Metabolic Panel: Recent Labs  Lab 11/18/17 0413 11/19/17 0519 11/20/17 0444 11/21/17 0432 11/22/17 0818  NA 129* 131* 132* 133* 133*  K 3.2* 3.6 3.2* 3.6 3.6  CL 90* 91* 92* 92* 93*  CO2 '28 28 29 29 29  ' GLUCOSE 106* 119* 103* 106* 97  BUN 29* 34* 39* 42* 46*  CREATININE 0.67 0.92 0.89 0.90 0.91  CALCIUM 8.6* 9.0 9.1 9.2 9.1  MG 2.1 2.2 2.1 2.0 2.1  PHOS 3.7 3.9 4.1 3.8 4.6   GFR Estimated Creatinine Clearance: 46 mL/min (by C-G formula based on SCr of 0.91 mg/dL). Liver  Function Tests: Recent Labs  Lab 11/18/17 0413 11/19/17 0519 11/20/17 0444 11/21/17 0432 11/22/17 0818  AST 31 30 35 39 39  ALT 36 '28 28 31 ' 34  ALKPHOS 323* 262* 303* 293* 247*  BILITOT 1.3* 1.3* 1.0 1.0 0.7  PROT 5.7* 6.1* 5.9* 6.1* 6.2*  ALBUMIN 3.0* 3.4* 3.3* 3.0* 2.9*   No results for input(s): LIPASE, AMYLASE in the last 168 hours. No results for input(s): AMMONIA in the last 168 hours. Coagulation profile No results for input(s): INR, PROTIME in the last 168 hours.  CBC: Recent Labs  Lab 11/19/17 0519 11/20/17 0444 11/21/17 0432 11/22/17 0500 11/22/17 0818  WBC 18.4* 15.6* 16.4* SPECIMEN CONTAMINATED, UNABLE TO PERFORM TEST(S). 16.1*  NEUTROABS 16.6* 13.3* 13.9* SPECIMEN CONTAMINATED, UNABLE TO PERFORM TEST(S). 13.4*  HGB 9.9* 8.7* 8.6* SPECIMEN CONTAMINATED, UNABLE TO PERFORM TEST(S). 9.1*  HCT 28.4* 25.6* 26.1* SPECIMEN CONTAMINATED, UNABLE TO PERFORM TEST(S). 26.3*  MCV 84.5 85.9 85.9 SPECIMEN CONTAMINATED, UNABLE TO PERFORM TEST(S). 85.7  PLT 549* 636* 800* SPECIMEN CONTAMINATED, UNABLE TO PERFORM TEST(S). 886*   Cardiac Enzymes: No results for input(s): CKTOTAL, CKMB, CKMBINDEX, TROPONINI in the last 168 hours. BNP: Invalid input(s): POCBNP CBG: No results for input(s): GLUCAP in the last 168 hours. D-Dimer No results  for input(s): DDIMER in the last 72 hours. Hgb A1c No results for input(s): HGBA1C in the last 72 hours. Lipid Profile Recent Labs    11/20/17 0444  TRIG 148   Thyroid function studies No results for input(s): TSH, T4TOTAL, T3FREE, THYROIDAB in the last 72 hours.  Invalid input(s): FREET3 Anemia work up No results for input(s): VITAMINB12, FOLATE, FERRITIN, TIBC, IRON, RETICCTPCT in the last 72 hours. Microbiology Recent Results (from the past 240 hour(s))  Culture, Urine     Status: Abnormal   Collection Time: 11/16/17 10:55 AM  Result Value Ref Range Status   Specimen Description   Final    URINE, CLEAN CATCH Performed at  [facility], [place] [address], [place], [place] [address]    Special Requests   Final    NONE Performed at [facility], [place] [address], [place], [place] [address]    Culture MULTIPLE SPECIES PRESENT, SUGGEST RECOLLECTION (A)  Final   Report Status 11/17/2017 FINAL  Final  Culture, blood (routine x 2)     Status: None   Collection Time: 11/16/17  9:02 PM  Result Value Ref Range Status   Specimen Description   Final    BLOOD RIGHT HAND Performed at [place] [address] [place], [place] [address]    Special Requests   Final    BOTTLES DRAWN AEROBIC AND ANAEROBIC Blood Culture adequate volume Performed at [place] [address], [place], [place] [address]    Culture   Final    NO GROWTH 5 DAYS Performed at [facility], [place] [address], [place], [place] [address]    Report Status 11/22/2017 FINAL  Final  Culture, blood (routine x 2)     Status: None   Collection Time: 11/17/17 12:18 AM  Result Value Ref Range Status   Specimen Description   Final    BLOOD RIGHT HAND Performed at [place] [address] [place], [place] [address]    Special Requests   Final    BOTTLES DRAWN AEROBIC AND ANAEROBIC Blood Culture adequate volume Performed at [place] [address] [place], [place] [address]    Culture   Final    NO GROWTH 5 DAYS Performed at [facility], [place] [address], [place], [place] [address]    Report Status 11/22/2017 FINAL  Final  Fungus Culture With Stain     Status: None (Preliminary result)   Collection Time: 11/17/17  5:13 PM  Result Value Ref Range Status   Fungus Stain Final report  Final    Comment: (NOTE) Performed At: [facility] [place], [place] [id number] Rush Farmer MD UX:[id number]    Fungus (Mycology) Culture PENDING  Incomplete   Fungal Source PERITONEAL  Final    Comment:  Performed at [facility], [place] [address], [place], [place] [address]  Culture, body fluid-bottle     Status: Abnormal   Collection Time: 11/17/17  5:13 PM  Result Value Ref Range Status   Specimen Description PERITONEAL  Final   Special Requests BOTTLES DRAWN AEROBIC AND ANAEROBIC 10CC  Final   Gram Stain   Final    GRAM POSITIVE COCCI IN CHAINS IN BOTH AEROBIC AND ANAEROBIC BOTTLES CRITICAL RESULT CALLED TO, READ BACK BY AND VERIFIED WITH: EDWARDS RN AT 0045 ON [id number] BY SJW Performed at [facility], [place] [address] [place], [place] [address]  Culture ENTEROCOCCUS FAECIUM (A)  Final   Report Status 11/22/2017 FINAL  Final   Organism ID, Bacteria ENTEROCOCCUS FAECIUM  Final      Susceptibility   Enterococcus faecium - MIC*    AMPICILLIN <=2 SENSITIVE Sensitive     VANCOMYCIN <=0.5 SENSITIVE Sensitive     GENTAMICIN SYNERGY SENSITIVE Sensitive     * ENTEROCOCCUS FAECIUM  Gram stain     Status: None   Collection Time: 11/17/17  5:13 PM  Result Value Ref Range Status   Specimen Description PERITONEAL  Final   Special Requests NONE  Final   Gram Stain   Final    FEW WBC PRESENT, PREDOMINANTLY PMN NO ORGANISMS SEEN Performed at [facility], [place] [address], [place], [place] [address]    Report Status 11/17/2017 FINAL  Final  Fungus Culture Result     Status: None   Collection Time: 11/17/17  5:13 PM  Result Value Ref Range Status   Result 1 Comment  Final    Comment: (NOTE) KOH/Calcofluor preparation:  no fungus observed. Performed At: [facility] [place], [place] [id number] Rush Farmer MD [id number] Performed at [facility], [place] [address], [place], [place] [address]   Fungus Culture With Stain     Status: None (Preliminary result)   Collection Time: 11/18/17  1:54 PM  Result Value Ref Range Status   Fungus Stain Final report  Final    Comment: (NOTE) Performed At: [facility] [place], [place] [id number] Rush Farmer MD [id number] Performed at [facility], [place] [address] [place], [place] [address]    Fungus (Mycology) Culture PENDING  Incomplete  Culture, body fluid-bottle     Status: None (Preliminary result)   Collection Time: 11/18/17  1:54 PM  Result Value Ref Range Status   Specimen Description FLUID RIGHT PLEURAL  Final   Special Requests BOTTLES DRAWN AEROBIC AND ANAEROBIC  Final   Culture   Final    NO GROWTH 3 DAYS Performed at [facility], [place] [address], [place], [place] [address]    Report Status PENDING  Incomplete  Gram stain     Status: None   Collection Time: 11/18/17  1:54 PM  Result Value Ref Range Status   Specimen Description FLUID RIGHT PLEURAL  Final   Special Requests NONE  Final   Gram Stain   Final    RARE WBC PRESENT, PREDOMINANTLY MONONUCLEAR NO ORGANISMS SEEN Performed at [facility], [place] [address], [place], [place] [address]    Report Status 11/19/2017 FINAL  Final  Fungus Culture Result     Status: None   Collection Time: 11/18/17  1:54 PM  Result Value Ref Range Status   Result 1 Comment  Final    Comment: (NOTE) KOH/Calcofluor preparation:  no fungus observed. Performed At: [facility] [place], [place] [id number] Rush Farmer MD [id number] Performed at [facility], [place] [address], [place], [place] [address]   Culture, body fluid-bottle     Status: None (Preliminary result)   Collection Time: 11/20/17 11:27 AM  Result Value Ref Range Status   Specimen Description FLUID  Final   Special Requests NONE  Final   Culture   Final    NO GROWTH 2 DAYS Performed at [facility], [place] [address] [place], [place] [address]    Report Status PENDING  Incomplete      Studies:  Ct Abdomen Pelvis Wo Contrast  Result Date: 11/03/2017 CLINICAL DATA:  Abdominal distension with decreased ostomy output  EXAM: CT ABDOMEN AND PELVIS WITHOUT CONTRAST TECHNIQUE: Multidetector CT imaging of the abdomen and pelvis was performed following the standard protocol without IV contrast. COMPARISON:  CT 07/10/2017, 05/22/2017, radiograph 11/02/2017 FINDINGS: Lower chest: Lung bases demonstrate small bilateral pleural effusions. No consolidation. Normal heart size. Hepatobiliary: No focal liver abnormality is seen. No gallstones, gallbladder wall thickening, or biliary dilatation. Pancreas: Unremarkable. No pancreatic ductal dilatation or surrounding inflammatory changes. Spleen: Normal in size without focal abnormality. Adrenals/Urinary Tract: Right adrenal gland is normal. Mild to moderate right hydronephrosis and hydroureter as before. Absent left kidney. Bladder within normal limits. Stomach/Bowel: Mild-to-moderate fluid distention of stomach. Multiple dilated loops of proximal to mid small bowel, measuring up to 4.5 cm. Abduct transition to decompressed distal small bowel in the right mid to lower abdomen, series 2, image number 43, with minimal contrast within decompressed small bowel loops distal to this. No appreciable contrast in the colon. Status post left lower quadrant double-barrel colostomy. Heterogeneous soft tissue thickening at the anus and rectum, corresponding to previously noted mass. Vascular/Lymphatic: Nonaneurysmal aorta. Limited evaluation for adenopathy due to diffuse abdominal fluid. Reproductive: No adnexal mass. Uterus inseparable from soft tissue mass at the rectum and anus. Other: No free air. Increased abdominal and pelvic ascites, now moderate. Diffuse subcutaneous edema. Musculoskeletal: No acute or suspicious abnormality. IMPRESSION: 1. Multiple dilated loops of small bowel with abduct transition to decompressed distal small bowel in the mid to lower abdomen slightly to the right of midline, consistent with small bowel obstruction. A small amount of contrast does pass into decompressed small  bowel distal to the point of obstruction. 2. Interim development of small pleural effusions. Development of moderate abdominopelvic ascites. Diffuse subcutaneous edema consistent with anasarca 3. Status post left lower quadrant colostomy. Soft tissue enlargement of the anus and rectum, corresponding to previously demonstrated infiltrative mass in the region. 4. Similar mild to moderate right hydronephrosis and slightly dilated right ureter. Electronically Signed   By: Donavan Foil M.D.   On: 11/03/2017 21:41   Dg Chest 1 View  Result Date: 11/20/2017 CLINICAL DATA:  Right thoracentesis. EXAM: CHEST  1 VIEW COMPARISON:  11/16/2017, 11/20/2017 FINDINGS: Small left pleural effusion. No significant right pleural effusion. No pneumothorax. No focal consolidation. Stable cardiomediastinal silhouette. Right-sided PICC line with the tip projecting over the SVC. No acute osseous abnormality. IMPRESSION: 1. No right pneumothorax status post thoracentesis. Electronically Signed   By: Kathreen Devoid   On: 11/20/2017 11:54   Dg Chest 1 View  Result Date: 11/18/2017 CLINICAL DATA:  Right thoracentesis EXAM: CHEST  1 VIEW COMPARISON:  11/16/2017 chest radiograph. FINDINGS: Right PICC terminates in lower third of the SVC. Surgical clips overlie the left lower chest. Stable cardiomediastinal silhouette with normal heart size. No pneumothorax. No significant residual right pleural effusion. Stable small left pleural effusion. No pulmonary edema. Persistent patchy left basilar lung opacity, unchanged. IMPRESSION: 1. No right pneumothorax. No significant residual right pleural effusion. 2. Stable small left pleural effusion and patchy left lung base opacity. Electronically Signed   By: Ilona Sorrel M.D.   On: 11/18/2017 16:38   Dg Abd 1 View  Result Date: 11/03/2017 CLINICAL DATA:  Small-bowel obstruction EXAM: ABDOMEN - 1 VIEW COMPARISON:  07/14/2017 FINDINGS: Moderate small bowel dilatation. Colon decompressed. Left lower  quadrant ostomy. No renal calculi. Normal skeletal structures. IMPRESSION: Small-bowel obstruction. Electronically Signed   By: Juanda Crumble  Carlis Abbott M.D.   On: 11/03/2017 08:44   Ct Abdomen Pelvis W Contrast  Result Date: 11/17/2017 CLINICAL DATA:  Leukocytosis. Recent partial colectomy, small-bowel resection and colostomy. EXAM: CT ABDOMEN AND PELVIS WITH CONTRAST TECHNIQUE: Multidetector CT imaging of the abdomen and pelvis was performed using the standard protocol following bolus administration of intravenous contrast. CONTRAST:  133m ISOVUE-300 IOPAMIDOL (ISOVUE-300) INJECTION 61% COMPARISON:  Preoperative noncontrast abdominal CT 11/03/2017 FINDINGS: Lower chest: Moderate bilateral pleural effusions and adjacent airspace disease, on the right this represents compressive atelectasis, on the left may be atelectasis or pneumonia. The heart is normal in size. Prior left mastectomy. Hepatobiliary: No focal hepatic lesion. Gallbladder partially distended, no calcified gallstone. No biliary dilatation. Pancreas: No ductal dilatation or inflammation. Spleen: Normal in size without focal abnormality. Adrenals/Urinary Tract: No adrenal nodule. Prior left nephrectomy. Moderate right hydronephrosis and proximal hydroureter, not significantly changed from prior exams. More distal ureter is nondistended. Urinary bladder is partially distended. Posterior bladder wall thickening more prominent on the left. Stomach/Bowel: Gastrostomy tube in the stomach. Prior small bowel dilatation has resolved. Enteric sutures noted in small bowel in the lower pelvis. No bowel obstruction with enteric contrast reaching the transverse colon and left mid abdominal colostomy. Stabled off transverse and descending colon are decompressed. Irregular enhancing rectal mass extending into the perirectal soft tissues and uterus, direct comparison difficult given lack of enhancement on prior exam. No peristomal hernia. Vascular/Lymphatic: Abdominal aorta  is normal in caliber. Portal, splenic, and mesenteric veins are patent. No acute vascular findings. No definite enlarged abdominal or pelvic lymph nodes. Small retroperitoneal nodes are similar to prior exam. Reproductive: Rectal mass involves the uterus which is heterogeneous, enlarged, with ill-defined borders inferiorly. Other: Moderate volume intra-abdominal ascites which measures simple fluid density, however slight peritoneal enhancement is noted. Loculated pelvic fluid appears similar to prior exams. Generalized body wall edema and skin thickening. No free air in the abdomen. Midline skin staples noted. Musculoskeletal: Possible vague lucent lesion within L1 and L3 vertebral bodies, not definitively seen on prior exams. No sclerotic lesions. IMPRESSION: 1. Recent small bowel resection and transverse colostomy. Moderate volume of intra-abdominal ascites with slight peritoneal enhancement, but no well-defined abscess. No free air. 2. Moderate bilateral pleural effusions and bibasilar airspace disease. On the right this is consistent with compressive atelectasis, on the left may be atelectasis or pneumonia in the setting of leukocytosis. Whole body wall edema and anasarca. 3. Enhancing rectal mass invading the uterus and possibly posterior wall of the bladder. 4. Stable right hydronephrosis and proximal hydroureter. 5. Possible lucent lesions within L1 and L3 vertebral bodies out or not seen on 07/10/2017 CT, and may represent osseous metastatic disease. This could be further assessed with nonemergent bone scan or PET-CT. Electronically Signed   By: MJeb LeveringM.D.   On: 11/17/2017 00:19   [place]  Result Date: 11/17/2017 INDICATION: Patient with history of stage IV lobular breast cancer, recent small bowel resection/transverse colostomy, ascites, bilateral pleural effusions, rectal mass. Request made for diagnostic and therapeutic paracentesis. EXAM: ULTRASOUND GUIDED DIAGNOSTIC AND THERAPEUTIC  PARACENTESIS MEDICATIONS: None COMPLICATIONS: None immediate. PROCEDURE: Informed written consent was obtained from the patient after a discussion of the risks, benefits and alternatives to treatment. A timeout was performed prior to the initiation of the procedure. Initial ultrasound scanning demonstrates a small to moderate amount of loculated ascites within the right lower abdominal quadrant. The right lower abdomen was prepped and draped in the usual sterile fashion. 1% lidocaine was used for  local anesthesia. Following this, a 6 Fr Safe-T-Centesis catheter was introduced. An ultrasound image was saved for documentation purposes. The paracentesis was performed. The catheter was removed and a dressing was applied. The patient tolerated the procedure well without immediate post procedural complication. FINDINGS: A total of approximately 570 cc of hazy, amber fluid was removed. Samples were sent to the laboratory as requested by the clinical team. Due to the multiloculated nature of the ascites only the above amount of fluid could be removed today. IMPRESSION: Successful ultrasound-guided diagnostic and therapeutic paracentesis yielding 570 cc of peritoneal fluid. Read by: Rowe Robert, PA-C Electronically Signed   By: Sandi Mariscal M.D.   On: 11/17/2017 16:31   Dg Chest Port 1 View  Result Date: 11/20/2017 CLINICAL DATA:  Shortness of breath. EXAM: PORTABLE CHEST 1 VIEW COMPARISON:  Single-view of the chest 11/19/2017, 11/18/2017 and 11/11/2017. FINDINGS: Left greater than right pleural effusions and basilar atelectasis appear unchanged since the most recent examination. No pneumothorax. Heart size is normal. Right PICC remains in place. Surgical clips left axilla noted. IMPRESSION: No change in left greater than right pleural effusions and basilar atelectasis since the most recent study. Electronically Signed   By: Inge Rise M.D.   On: 11/20/2017 08:46   Dg Chest Port 1 View  Result Date:  11/19/2017 CLINICAL DATA:  Dyspnea EXAM: PORTABLE CHEST 1 VIEW COMPARISON:  Chest radiograph from one day prior. FINDINGS: Right PICC terminates in lower third of the SVC. Surgical clips overlie the left chest wall. Stable cardiomediastinal silhouette with normal heart size. No pneumothorax. Small bilateral pleural effusions are probably stable accounting for differences in positioning. No pulmonary edema. Left basilar atelectasis is stable. IMPRESSION: 1. Small bilateral pleural effusions, probably stable accounting for differences in positioning. 2. Left basilar atelectasis, stable. Electronically Signed   By: Ilona Sorrel M.D.   On: 11/19/2017 08:00   Dg Chest Port 1 View  Result Date: 11/16/2017 CLINICAL DATA:  Leukocytosis. EXAM: PORTABLE CHEST 1 VIEW COMPARISON:  Chest radiograph 11/11/2004, lung bases from abdominal CT 11/03/2017 FINDINGS: Left pleural effusion and basilar opacity, unchanged from prior exam. Small right pleural effusion has improved from prior exam. Mild residual right basilar atelectasis. Heart is normal in size. No new airspace disease. Tip of the right upper extremity PICC in the SVC. Left mastectomy with surgical clips in the chest wall. IMPRESSION: Left pleural effusion and basilar opacity are unchanged from chest radiograph 5 days ago. Basilar opacity likely secondary to atelectasis, sterility technically indeterminate by radiograph. Improved right pleural effusion and atelectasis from prior exam. No new airspace disease. Electronically Signed   By: Jeb Levering M.D.   On: 11/16/2017 20:42   Dg Chest Port 1 View  Result Date: 11/11/2017 CLINICAL DATA:  Acute kidney injury.  Chest pain. EXAM: PORTABLE CHEST 1 VIEW COMPARISON:  10/31/2016 FINDINGS: Right arm PICC tip in the SVC 4 cm above the right atrium. Bilateral pleural effusions with dependent atelectasis. Upper lungs are clear. Previous mastectomy on the left. No acute bone finding. IMPRESSION: Bilateral effusions with  dependent pulmonary atelectasis. Electronically Signed   By: Nelson Chimes M.D.   On: 11/11/2017 15:17   Dg Abd 2 Views  Result Date: 11/07/2017 CLINICAL DATA:  61 year old female with small bowel obstruction. Recent colonic surgery. Subsequent encounter. EXAM: ABDOMEN - 2 VIEW COMPARISON:  11/05/2017 plain film examination.  11/03/2017 CT. FINDINGS: Gas and fluid distended dilated small bowel loops more prominent than on the recent plain film examination with small  bowel measuring up to 4.7 cm versus prior 4.5 cm. Small bowel fold thickening. Air-fluid levels. Interval development of gas filled stomach. Minimal residual contrast within colon. Colostomy left lower quadrant. No free intraperitoneal air noted. IMPRESSION: Progressive partial small bowel obstructive pattern. Interval development of gas-filled stomach. No free intraperitoneal air. These results will be called to the ordering clinician or representative by the Radiologist Assistant, and communication documented in the PACS or zVision Dashboard. Electronically Signed   By: Genia Del M.D.   On: 11/07/2017 09:05   Dg Abd 2 Views  Result Date: 11/05/2017 CLINICAL DATA:  Per order- small bowel obstruction PT HX: GERD, bladder sugery EXAM: ABDOMEN - 2 VIEW COMPARISON:  CT 11/03/2017 and previous FINDINGS: Visualized lung bases clear.  No free air. Multiple dilated small bowel loops with fluid levels on the erect radiograph. Similar number of involved loops and degree of dilatation since previous radiograph 11/02/2017. There has been progression of oral contrast material into the proximal colon which is nondilated. There is a left lower quadrant ostomy device. No abnormal abdominal calcifications.  Regional bones unremarkable. IMPRESSION: 1. Findings consistent with persistent partial mid/distal small bowel obstruction. 2. No free air. Electronically Signed   By: Lucrezia Europe M.D.   On: 11/05/2017 12:23   [place] Ekg Site Rite  Result Date:  11/04/2017 If Site Rite image not attached, placement could not be confirmed due to current cardiac rhythm.  US Thoracentesis Asp Pleural Space W/img Guide  Result Date: 11/20/2017 INDICATION: Patient with history of stage IV lobular breast cancer, recent small bowel resection/transverse colostomy, ascites, bilateral pleural effusions, rectal mass. Request made for diagnostic and therapeutic left thoracentesis. EXAM: ULTRASOUND GUIDED DIAGNOSTIC AND THERAPEUTIC LEFT THORACENTESIS MEDICATIONS: None COMPLICATIONS: None immediate. PROCEDURE: An ultrasound guided thoracentesis was thoroughly discussed with the patient and questions answered. The benefits, risks, alternatives and complications were also discussed. The patient understands and wishes to proceed with the procedure. Written consent was obtained. Ultrasound was performed to localize and mark an adequate pocket of fluid in the left chest. The area was then prepped and draped in the normal sterile fashion. 1% Lidocaine was used for local anesthesia. Under ultrasound guidance a 6 Fr Safe-T-Centesis catheter was introduced. Thoracentesis was performed. The catheter was removed and a dressing applied. FINDINGS: A total of approximately 170 cc of yellow fluid was removed. Samples were sent to the laboratory as requested by the clinical team. IMPRESSION: Successful ultrasound guided diagnostic and therapeutic left thoracentesis yielding 170 cc of pleural fluid. Read by: Rowe Robert, PA-C Electronically Signed   By: Markus Daft M.D.   On: 11/20/2017 11:28   US Thoracentesis Asp Pleural Space W/img Guide  Result Date: 11/18/2017 INDICATION: Pleural effusions, metastatic breast cancer EXAM: ULTRASOUND GUIDED RIGHT THORACENTESIS MEDICATIONS: 1% lidocaine local COMPLICATIONS: None immediate. PROCEDURE: An ultrasound guided thoracentesis was thoroughly discussed with the patient and questions answered. The benefits, risks, alternatives and complications were also  discussed. The patient understands and wishes to proceed with the procedure. Written consent was obtained. Ultrasound was performed to localize and mark an adequate pocket of fluid in the right chest. The area was then prepped and draped in the normal sterile fashion. 1% Lidocaine was used for local anesthesia. Under ultrasound guidance a 6 Fr Safe-T-Centesis catheter was introduced. Thoracentesis was performed. The catheter was removed and a dressing applied. FINDINGS: A total of approximately 230 cc of serosanguineous pleural fluid was removed. Samples were sent to the laboratory as requested by the clinical team.  IMPRESSION: Successful ultrasound guided right thoracentesis yielding 230 cc of pleural fluid. Electronically Signed   By: Jerilynn Mages.  Shick M.D.   On: 11/18/2017 13:44    Assessment: 62 y.o.  BRCA 1-2 negative [facility] woman with lobular breast cancer stage IV at presentation July 2011, admitted with recurren SBO in the setting of abdominal carcinomatosis  (1) status post left mastectomy and sentinel lymph node sampling in July 2011 for a lower inner quadrant T1 N1 M1, stage IV invasive lobular breast cancer, grade 1, strongly estrogen and progesterone receptor-positive, HER2 negative with MIB-1 of 9% and no HER2 amplification,   (2) with multiple sclerotic bone lesions at presentation seen only on CT scan (not on bone scan or PET scan), but  with biopsy-proven metastatic disease to bone; and an elevated CA 27.29 at presentation,   (3) Oncotype recurrence score of 4, predicts a good response to antiestrogens.  (4) Systemic treatment has consisted of             a) tamoxifen with evidence of response but poor tolerance             b) letrozole starting August 2012, discontinued October 2014 per patient              (5) single functioning kidney  (6) status post bilateral salpingo-oophorectomy 01/24/2013, with benign pathology  (7) osteoporosis; the patient refuses bisphosphonate  therapy; started [facility] February 2014.               (a) bone density was obtained under the care of Dr. Matthew Saras at [facility], 06/25/2012, showing osteoporosis with a T score -2.6. This was repeated 12/20/2013, showing again osteoporosis with T-scores between -2.4 and -2.8.             (b) the patient refuses zolendronate, denosumab, or other pharmacologic intervention             (c) bone density on 12/30/2015 under Dr. Matthew Saras showing osteoporosis with T score -3.3  (8) the patient refuses standard Mammography or tomography; undergoing thermography screening of the right breast.  (9) PET scan 06/05/2015 shows rectal thickening and a presacral mass; biopsy of this area 10/09/2015 confirms metastatic lobular breast cancer, again estrogen receptor positive, HER-2 nonamplified.             (a) pelvic MRI 03/11/2016 confirms stability of disease             (b) chest CT and pelvic MRI 09/02/2016 shows no change in the circumferential rectal thickening or evidence of extension beyond the serosa and chest CT findings             (c) colonoscopy 02/23/2017 showed a very narrow rectal lumen with a few apparently uninvolved centimeters distally              (d) CT of the abdomen and pelvis 05/22/2017 (after diverting colostomy) shows interval increase in the size of the perirectal soft tissue masses.  (10) started fulvestrant and palbociclib 125 mg/ day [21/7] May 2017             (a) palbociclib dose decreased to 100 mg daily [21/7] with second cycle, started 11/25/2015             (b) palbociclib discontinued 03/20/2017 with evidence of disease progression             (c) last fulvestrant dose 03/24/2017, discontinued with evidence of progression  (11) status [facility] [place] 2018  (12) started exemestane 07/05/2017,             (a) everolimus 5 mg/d started 07/14/2017--held as of 08/20/2017 secondary to rash              (b) exemestane stopped by patient 09/04/2017 to try "the natural way".  (13) exemestane resumed 10/16/2017  (a) abemaciclib/Verzenio added 10/18/2017, currently being held  (14) s/p laparoscopic small bowel resection for SBO 11/08/2017  (a) peritoneal fluid 11/17/2017 growing enterococcus, vanc sensitive    Plan:  Sonia Hill continues to improve clinically. If she does not develop another obstruction I am hopeful she will improve her nutritional status enough to reverse the anasarca.  She is tolerating exemestane well. We will add the CDK4,6 inhibitors after discharge and once the infectious issue is resolved.  We discussed possibly placing a port because of her poor access. At this point she demurs.  I will schedule Sonia Hill to see me within 5 days of discharge--she will call the office to set that up.  I appreciate the excellent care you are giving this patient and her family!  I will be unavailable until 06/17. Please consult my parents if necessary before then Chauncey Cruel, MD 11/22/2017  7:04 PM Medical Oncology and Hematology [facility] [address] [place], [place] [address] Tel. [phone]    Fax. [phone]

## 2017-11-22 NOTE — Progress Notes (Signed)
PROGRESS NOTE    Sonia Hill  DVV:[id number] DOB: 11-30-1955 DOA: 11/03/2017 PCP: [name], MD   Brief Narrative:  The patient is a 23 female with metastatic breast cancer who sees Dr. [name] - on Verzenio, Hx of Colostomy + rectal mass 04/2017-complicated by SBO-recurrent SBO 06/2017 and currently on liquid diet at home. She was admitted with severe excruciating abdominal pain no ostomy output. CT Abdomen showed worsening cancer. Underwent Surgery on 5/29 and had Diagnostic Laparoscopy, Lysis of Adhesions, Small Bowel Resection, Insertion of Gastrotomy Tube and Partial Colectomy with new Colostomy. Currently on TPN and diet being advanced to soft Diet and is doing better but WBC started Elevating so will pan-culture. CT of Abdomen and CXR show possible PNA so patient was started on Empiric Zosyn. Patient underwent Thoracentesis x 2 (Left and Right) and Paracentesis which found Enterococcus SBP; She remains grossly volume overloaded but is improving slowly with diuresis and is has diurese 12 liters so far.   11/22/17 - Seen alongside patient's husband, case management, pharmacy and patient's nurse.  Patient is not tolerating food orally.  However, the patient is still on TPN.  Peritoneal fluid grew enterococcus that is sensitive to vancomycin.  Patient is currently on antibiotics.  The patient will prefer not to be discharged back to come on TPN at nighttime.  We will proceed with caloric count.  Also monitor I&O.  We will continue antibiotics for now.  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    Ascites   New onset left bundle branch block (LBBB)   Metastatic breast cancer to small intestine causing SBO s/p SB resection 11/08/2017   Metastatic breast cancer to colostomy s/p colectomy/colostomy 11/08/2017   Cachexia ([place])   Malnutrition of moderate degree   Anemia  Malignant bowel obstruction/small bowel obstruction secondary to Breast Cancer Metastasis status post small bowel resection 11/08/2017 -Patient noted to have no significant improvement with small bowel obstruction and subsequently underwent laparoscopic diagnostic small bowel resection, insertion of gastrostomy tube, lysis of adhesion, partial colectomy with new colostomy per Dr. Johney Maine 11/08/2017.   -NG tube has been discontinued per General Surgery.   -IV Robaxin 500 mg q8hhas been added to patient's regimen.   -Patient noted to have some bowel function.   -Tolerating Soft Diet but also will have TNA cycled.  -TNA per General Surgery and even though patient is tolerating po they feel she is not consistently meeting Nutritional needs so are anticipating TNA at D/C; General Surgery planning cyclic TNA -Pain regimen has been adjusted per General Surgery; Continue current regimen for now -Patient has continued to improve.  Patient is tolerating oral food.  Hyponatremia, improved  -Initially felt likely secondary to hypervolemic hyponatremia plus or minus SIADH and now likley from massive volume overloaded  -Patient noted to have lower extremity edema as well as low albumin levels.   -Sodium is now 133 and IV Diuresis with 80 mg BID started and will continue  -TSH within normal limits.   -Cortisol level within normal limits.  -Nephrology consulted to assessed the patient and feel hyponatremia may be secondary  to hypervolemia with anasarca.   -Patient was started on IV albumin and low-dose salt tablets as well as compression stockings but Salt Tablets have been stopped now; IV Albumin  Stopped today -Sodium levels currently went from 125 -> 129 -> 131 -> 132 -> 133 -Transition to oral Lasix 80 mg BID once improved but continue IV Furosemide 80 mg BID as she is massively volume overloaded for now -Nephrology following and appreciate input and recommendations.  -C/w Compression Stockings  Hypokalemia/hypocalcemia/hypomagnesemia -Continue TPN/TNA per General Surgery  -Electrolytes being repleted per pharmacy through [facility] currently   Hypothyroidism  -TSH 3.123.  IV Synthroid changed to po  -C/w po 100 mcg po Daily.    Metastatic estrogen positive lobular breast cancer -Patient with metastatic spread to the spine and rectal area resulting in blockage and diverging colostomy.   -Oncology following and feel as if she is turning the corner -Dr. [name] recommending resuming Exemestane at this point and continue to hold Abemaciclib   Solitary kidney with Hydronephrosis -Status post Foley catheter placement.   -Renal function stable. Urology consulted and no further work-up needed during this hospitalization. -Outpatient follow-up with Urology.    Anxiety -Ativan as needed.  Severe protein calorie malnutrition -On TPN per General Surgery  -Nutrition is consulted and appreciate further evaluation recommendations -Continue Ensure Enlive 237 mL's p.o. 3 times daily with meals  Volume Overload/Anasarca -Patient with lower extremity edema, abdominal swelling and anasarca  -Likely component of hypoalbuminemia. -Resumed IV Lasix at 80 mg BID and will hold po doses  -Urine output not accurately recorded.   -Continue to MonitorStrict I's and O's.  Daily weights. -Patient is +7.816 Liters since Admission and Weight is now 118 (up from 108 on Admission) -Ordered Thoracentesis and Paracentesis and is improved from that end  New left bundle branch block -Noted on EKG the night of 11/08/2017.   -Patient denies any chest pain no shortness of breath.   -2D echo  with a EF of 65 to 70%, no wall motion abnormalities, left ventricular diastolic function normal.   -Patient has been seen in consultation by Cardiology and no further ischemic work-up recommended at this time.   Singultus/Hiccups, improved -Likely secondary to malignant bowel obstruction.   -Patient on Reglan which has been placed 3 times daily at a lower dose per oncology but now Stopped.  -Patient given a dose of Thorazine however was having's some side effects from it and as such Thorazine has been discontinued. -Patient given Cogentin 1 mg IV every 12 hours x2 doses.   -Clinical improvement with hiccups noted  Normocytic Anemia -Patient is status post 2 units packed red blood cells 11/12/2017.  -Patient denies any overt bleeding.   -Colostomy with small brown stool.  FOBT was positive.  Anemia panel has been ordered and looks like patient does have a severe iron deficiency anemia with iron level less than 5.   -IV Feraheme x1.  LDH, haptoglobin, hepatic panel not consistent with hemolysis.  CT abdomen and pelvis was done and is as below.   -Patient was seen in consultation by Gastroenterology who are recommending serial CBCs.  Transfuse as needed.   -Hb/Hct was stable at 9.9/28.4 and dropped slightly to 8.6/26.1 -Continue to Monitor for S/Sx of Bleeding -Repeat CBC in AM  Leukocytosis likely in the setting of PNA and SBP and suspect SBP given Enterococcus in Fluid Cx  -Worsening -WBC went from 9.6 -> 14.2 -> 17.3 -> 19.1 -> 18.9 -> 18.4 -> 15.6 ->  16.4 -Urinalysis Rechecked along with Urine Culture and Urinalysis Negative  -Last Urine Cx on 6/1 showed 30,000 CFU of Pseudomonas Aeruginosa; Repeat Cx showed Multiple Species Present  -Was Not on Abx but will start on Empiric Zosyn given possible Pneumonia noted on CT Scan of Abdomen and CXR -Repeat Blood Cultures showed NGTD at 4 days -CXR showed Left pleural effusion and basilar opacity are unchanged from chest radiograph 5 days ago.  Basilar opacity likely secondary to atelectasis, sterility technically indeterminate by radiograph. Improved right pleural effusion and atelectasis from prior exam. Nonew airspace disease. -Checked CT Abdomen and Pelvis showed Recent small bowel resection and transverse colostomy. Moderate volume of intra-abdominal ascites with slight peritoneal enhancement, but no well-defined abscess. No free air. Moderate bilateral pleural effusions and bibasilar airspace disease. On the right this is consistent with compressive atelectasis, on the left may be atelectasis or pneumonia in the setting of leukocytosis. Whole body wall edema and anasarca. Enhancing rectal mass invading the uterus and possibly posterior wall of the bladder. Stable right hydronephrosis and proximal hydroureter. Possible lucent lesions within L1 and L3 vertebral bodies out or not seen on 07/10/2017 CT, and may represent osseous metastatic Disease -Ordered Thoracentesis and Paracentesis and done  -Paracentesis done and showed Cloudy Appearance with 2837 WBC's and 82 Neutrophils with other Cx studies showing ENTEROCOCCUS FAECIUM with Susceptibilities to Follow  -Left and Right Thoracentesis done  -C/w Empiric Zosyn and Repeat CBC in AM -Continue to monitor CBC for now.  Abnormal LFT's/Elevated Alk Phos,improving  -AST was 108, ALT was 108, and Alk Phos was 471; Now improved and AST is 39, ALT is 31 and Alk Phos is 293 -Likely from TNA/TPN -Continue to Monitor and Repeat CMP in AM   Hypokalemia -Patient's potassium level is 3.6 this AM -Continue to monitor and replete as necessary and being replaced by Pharmacy with TNA -Repeat CMP in a.m.  Hyperbilirubinemia -Mild at 1.3 and now improved at 1.0 -Continue to Monitor and Repeat CMP in AM  Thrombocytosis -Likely worsening in the setting of Infection with Enterococcus Faecium -Platelet Count went from 401 -> 549 -> 636 -> 800 -Continue to Monitor and repeat CBC in AM   DVT  prophylaxis: SCDs Code Status: FULL CODE Family Communication: Discussed with family at bedside and husband over the phone Disposition Plan: Anticipate D/C Home when medically stable in next 48-72 hours hopefully if not sooner  Consultants:   General Surgery: [name] 11/04/2017  Palliative care: Dr. Rowe Pavy 10/30/2017  Cardiology: Dr.Skains 11/10/2017  Nephrology: Dr: Jonnie Finner 11/11/2017  Gastroenterology: Dr. Cristina Gong 11/12/2017  Urology: Dr. Alinda Money 11/13/2017  Pharmacy for TNA  Procedures: S/P diagnostic laparoscopy, LOA, SBR, insertion gastrostomy tube, partial colectomy new colostomy 11/08/17 Dr. Johney Maine  Throacentesis Right - Drained 230 mL Thoracentesis Left - Drained 130 mL  Paracentesis - Removed 570 mL  Antimicrobials:  Anti-infectives (From admission, onward)   Start     Dose/Rate Route Frequency Ordered Stop   11/17/17 1030  piperacillin-tazobactam (ZOSYN) IVPB 3.375 g     3.375 g 12.5 mL/hr over 240 Minutes Intravenous Every 8 hours 11/17/17 1016     11/09/17 0000  cefoTEtan (CEFOTAN) 2 g in sodium chloride 0.9 % 100 mL IVPB     2 g 200 mL/hr over 30 Minutes Intravenous Every 12 hours 11/08/17 1748 11/09/17 0021   11/08/17 1021  sodium chloride 0.9 % with cefoTEtan (CEFOTAN) ADS Med    Note to Pharmacy:  Marchia Meiers   : cabinet override  11/08/17 1021 11/08/17 2229   11/08/17 0600  cefoTEtan (CEFOTAN) 2 g in sodium chloride 0.9 % 100 mL IVPB  Status:  Discontinued    Note to Pharmacy:  Pharmacy may adjust dose strength for optimal dosing.   Send with patient on call to the OR.  Anest[facility] to complete antibiotic administration [facility]   2 g 200 mL/hr over 30 Minutes Intravenous On call to O.R. 11/07/17 1234 11/07/17 1433   11/08/17 0600  cefoTEtan in Dextrose 5% (CEFOTAN) IVPB 2 g     2 g Intravenous On call to O.R. 11/07/17 1505 11/08/17 1207   11/07/17 1500  cefoTEtan in Dextrose 5% (CEFOTAN) IVPB 2 g  Status:  Discontinued      2 g Intravenous On call to O.R. 11/07/17 1433 11/07/17 1505     Subjective: Patient seen alongside patient's husband and rounding team.  No new complaints.  Patient is tolerating oral feeds without much problems.  Objective: Vitals:   11/21/17 2043 11/22/17 0500 11/22/17 0557 11/22/17 1455  BP: (!) 138/99  126/69 127/82  Pulse: (!) 117  (!) 106 (!) 116  Resp: '18  18 16  ' Temp: 98 F (36.7 C)  97.9 F (36.6 C) 98.1 F (36.7 C)  TempSrc: Oral  Oral Oral  SpO2: 100%  100% 98%  Weight:  53.1 kg (117 lb)    Height:        Intake/Output Summary (Last 24 hours) at 11/22/2017 1708 Last data filed at 11/22/2017 1600 Gross per 24 hour  Intake 1608.05 ml  Output 2400 ml  Net -791.95 ml   Filed Weights   11/20/17 0500 11/21/17 0500 11/22/17 0500  Weight: 54.1 kg (119 lb 4.3 oz) 53.7 kg (118 lb 6.4 oz) 53.1 kg (117 lb)   Examination: Physical Exam:  Constitutional: Cachectic.  Not in distress.   Eyes: There are anicteric.  Lids and conjunctive are normal. ENMT: External ears and nose appear normal.  Grossly normal hearing Neck: Supple with no JVD Respiratory: Diminished to auscultation bilaterally with mild crackles.  No appreciable wheezing, rales, rhonchi.  Unlabored breathing is not using accessory muscles to breathe Cardiovascular: Tachycardic rate and rhythm.  No appreciable murmurs, rubs, gallops.  Has 2+ to 3+ lower extremity edema and still remains in a stocking Abdomen: Soft and tender slightly to palpation.  Remains distended with abdominal wall edema and is slightly worse today.  Colostomy bag in place with aggressive distention and some liquid stool.  Abdominal scarring is noted and she does have a G-tube in place Musculoskeletal: No contractures or cyanosis noted.  No joint deformity noted.  Has a right PICC line in place Skin: Skin is warm and dry no appreciable rashes or lesions limited skin evaluation Neurologic: Awake, alert and oriented to time, place and person.   Patient moves all limbs.   Psychiatric: Normal mood and pleasant affect.  Intact judgment and insight.  Patient is awake, alert, and oriented x3.  Data Reviewed: I have personally reviewed following labs and imaging studies  CBC: Recent Labs  Lab 11/19/17 0519 11/20/17 0444 11/21/17 0432 11/22/17 0500 11/22/17 0818  WBC 18.4* 15.6* 16.4* SPECIMEN CONTAMINATED, UNABLE TO PERFORM TEST(S). 16.1*  NEUTROABS 16.6* 13.3* 13.9* SPECIMEN CONTAMINATED, UNABLE TO PERFORM TEST(S). 13.4*  HGB 9.9* 8.7* 8.6* SPECIMEN CONTAMINATED, UNABLE TO PERFORM TEST(S). 9.1*  HCT 28.4* 25.6* 26.1* SPECIMEN CONTAMINATED, UNABLE TO PERFORM TEST(S). 26.3*  MCV 84.5 85.9 85.9 SPECIMEN CONTAMINATED, UNABLE TO PERFORM TEST(S). 85.7  PLT 549* 636* 800* SPECIMEN CONTAMINATED, UNABLE TO PERFORM TEST(S). 106*   Basic Metabolic Panel: Recent Labs  Lab 11/18/17 0413 11/19/17 0519 11/20/17 0444 11/21/17 0432 11/22/17 0818  NA 129* 131* 132* 133* 133*  K 3.2* 3.6 3.2* 3.6 3.6  CL 90* 91* 92* 92* 93*  CO2 '28 28 29 29 29  ' GLUCOSE 106* 119* 103* 106* 97  BUN 29* 34* 39* 42* 46*  CREATININE 0.67 0.92 0.89 0.90 0.91  CALCIUM 8.6* 9.0 9.1 9.2 9.1  MG 2.1 2.2 2.1 2.0 2.1  PHOS 3.7 3.9 4.1 3.8 4.6   GFR: Estimated Creatinine Clearance: 46 mL/min (by C-G formula based on SCr of 0.91 mg/dL). Liver Function Tests: Recent Labs  Lab 11/18/17 0413 11/19/17 0519 11/20/17 0444 11/21/17 0432 11/22/17 0818  AST 31 30 35 39 39  ALT 36 '28 28 31 ' 34  ALKPHOS 323* 262* 303* 293* 247*  BILITOT 1.3* 1.3* 1.0 1.0 0.7  PROT 5.7* 6.1* 5.9* 6.1* 6.2*  ALBUMIN 3.0* 3.4* 3.3* 3.0* 2.9*   No results for input(s): LIPASE, AMYLASE in the last 168 hours. No results for input(s): AMMONIA in the last 168 hours. Coagulation Profile: No results for input(s): INR, PROTIME in the last 168 hours. Cardiac Enzymes: No results for input(s): CKTOTAL, CKMB, CKMBINDEX, TROPONINI in the last 168 hours. BNP (last 3 results) No results for  input(s): PROBNP in the last 8760 hours. HbA1C: No results for input(s): HGBA1C in the last 72 hours. CBG: No results for input(s): GLUCAP in the last 168 hours. Lipid Profile: Recent Labs    11/20/17 0444  TRIG 148   Thyroid Function Tests: No results for input(s): TSH, T4TOTAL, FREET4, T3FREE, THYROIDAB in the last 72 hours. Anemia Panel: No results for input(s): VITAMINB12, FOLATE, FERRITIN, TIBC, IRON, RETICCTPCT in the last 72 hours. Sepsis Labs: No results for input(s): PROCALCITON, LATICACIDVEN in the last 168 hours.  Recent Results (from the past 240 hour(s))  Culture, Urine     Status: Abnormal   Collection Time: 11/16/17 10:55 AM  Result Value Ref Range Status   Specimen Description   Final    URINE, CLEAN CATCH Performed at [facility], [place] [address], [place], [place] [address]    Special Requests   Final    NONE Performed at [facility], [place] [address] [place], [place] [address]    Culture MULTIPLE SPECIES PRESENT, SUGGEST RECOLLECTION (A)  Final   Report Status 11/17/2017 FINAL  Final  Culture, blood (routine x 2)     Status: None   Collection Time: 11/16/17  9:02 PM  Result Value Ref Range Status   Specimen Description   Final    BLOOD RIGHT HAND Performed at [place] [address], [place], [place] [address]    Special Requests   Final    BOTTLES DRAWN AEROBIC AND ANAEROBIC Blood Culture adequate volume Performed at [place] [address] [place], [place] [address]    Culture   Final    NO GROWTH 5 DAYS Performed at [facility], [place] [address], [place], [place] [address]    Report Status 11/22/2017 FINAL  Final  Culture, blood (routine x 2)     Status: None   Collection Time: 11/17/17 12:18 AM  Result Value Ref Range Status   Specimen Description   Final    BLOOD RIGHT HAND Performed at [place] [address]  [place],  [address]  Special Requests   Final    BOTTLES DRAWN AEROBIC AND ANAEROBIC Blood Culture adequate volume Performed at [place] [address] [place], [place] [address]    Culture   Final    NO GROWTH 5 DAYS Performed at [facility], [place] [address], [place], [place] [address]    Report Status 11/22/2017 FINAL  Final  Fungus Culture With Stain     Status: None (Preliminary result)   Collection Time: 11/17/17  5:13 PM  Result Value Ref Range Status   Fungus Stain Final report  Final    Comment: (NOTE) Performed At: [facility] [place], [place] [id number] Rush Farmer MD [id number]    Fungus (Mycology) Culture PENDING  Incomplete   Fungal Source PERITONEAL  Final    Comment: Performed at [facility], [place] [address] [place], [place] [address]  Culture, body fluid-bottle     Status: Abnormal   Collection Time: 11/17/17  5:13 PM  Result Value Ref Range Status   Specimen Description PERITONEAL  Final   Special Requests BOTTLES DRAWN AEROBIC AND ANAEROBIC 10CC  Final   Gram Stain   Final    GRAM POSITIVE COCCI IN CHAINS IN BOTH AEROBIC AND ANAEROBIC BOTTLES CRITICAL RESULT CALLED TO, READ BACK BY AND VERIFIED WITH: EDWARDS RN AT 8115 ON [id number] BY SJW Performed at [facility], [place] [address], [place], [place] [address]    Culture ENTEROCOCCUS FAECIUM (A)  Final   Report Status 11/22/2017 FINAL  Final   Organism ID, Bacteria ENTEROCOCCUS FAECIUM  Final      Susceptibility   Enterococcus faecium - MIC*    AMPICILLIN <=2 SENSITIVE Sensitive     VANCOMYCIN <=0.5 SENSITIVE Sensitive     GENTAMICIN SYNERGY SENSITIVE Sensitive     * ENTEROCOCCUS FAECIUM  Gram stain     Status: None   Collection Time: 11/17/17  5:13 PM  Result Value Ref Range Status   Specimen Description PERITONEAL  Final   Special Requests NONE  Final   Gram Stain   Final    FEW WBC PRESENT, PREDOMINANTLY PMN NO  ORGANISMS SEEN Performed at [facility], [place] [address], [place], [place] [address]    Report Status 11/17/2017 FINAL  Final  Fungus Culture Result     Status: None   Collection Time: 11/17/17  5:13 PM  Result Value Ref Range Status   Result 1 Comment  Final    Comment: (NOTE) KOH/Calcofluor preparation:  no fungus observed. Performed At: [facility] [place], [place] [id number] Rush Farmer MD [id number] Performed at [facility], [place] [address] [place], [place] [address]   Fungus Culture With Stain     Status: None (Preliminary result)   Collection Time: 11/18/17  1:54 PM  Result Value Ref Range Status   Fungus Stain Final report  Final    Comment: (NOTE) Performed At: [facility] [place], [place] [id number] Rush Farmer MD EK:[id number] Performed at [name], [place] [address], [place], [place] [address]    Fungus (Mycology) Culture PENDING  Incomplete  Culture, body fluid-bottle     Status: None (Preliminary result)   Collection Time: 11/18/17  1:54 PM  Result Value Ref Range Status   Specimen Description FLUID RIGHT PLEURAL  Final   Special Requests BOTTLES DRAWN AEROBIC AND ANAEROBIC  Final   Culture   Final    NO GROWTH  3 DAYS Performed at [facility], [place] [address] [place], [place] [address]    Report Status PENDING  Incomplete  Gram stain     Status: None   Collection Time: 11/18/17  1:54 PM  Result Value Ref Range Status   Specimen Description FLUID RIGHT PLEURAL  Final   Special Requests NONE  Final   Gram Stain   Final    RARE WBC PRESENT, PREDOMINANTLY MONONUCLEAR NO ORGANISMS SEEN Performed at [facility], [place] [address], [place], [place] [address]    Report Status 11/19/2017 FINAL  Final  Fungus Culture Result     Status: None   Collection Time: 11/18/17  1:54 PM  Result Value Ref Range Status   Result 1 Comment  Final    Comment:  (NOTE) KOH/Calcofluor preparation:  no fungus observed. Performed At: [facility] [place], [place] [id number] Rush Farmer MD [id number] Performed at [facility], [place] [address] [place], [place] [address]   Culture, body fluid-bottle     Status: None (Preliminary result)   Collection Time: 11/20/17 11:27 AM  Result Value Ref Range Status   Specimen Description FLUID  Final   Special Requests NONE  Final   Culture   Final    NO GROWTH 2 DAYS Performed at [facility], [place] [address] [place],  [address]    Report Status PENDING  Incomplete    Radiology Studies: No results found.  Scheduled Meds: . acetaminophen  1,000 mg Oral Q8H  . exemestane  25 mg Oral QPC breakfast  . famotidine  20 mg Oral BID  . feeding supplement (PRO-STAT SUGAR FREE 64)  30 mL Oral BID  . furosemide  80 mg Intravenous Q12H  . gabapentin  300 mg Oral TID  . levothyroxine  100 mcg Oral QAC breakfast  . lip balm  1 application Topical BID  . LORazepam  0.5 mg Intravenous Q24H  . methocarbamol  500 mg Oral Q8H  . multivitamin with minerals  1 tablet Oral Daily  . saccharomyces boulardii  250 mg Oral BID   Continuous Infusions: . piperacillin-tazobactam (ZOSYN)  IV 3.375 g (11/22/17 1115)  . Marland KitchenTPN (CLINIMIX-E) Adult Stopped (11/22/17 1438)    LOS: 52 days   Bonnell Public, MD [facility] Pager [phone]  If 7PM-7AM, please contact night-coverage [email or web address] Password [facility] 11/22/2017, 5:08 PM

## 2017-11-22 NOTE — Plan of Care (Signed)
Patient without complaint on 7 a to 7 p shift, VSS. Walked in hallway multiple times during shift.  Patient having food brought in by family members and is excited that she is able to eat after months on a liquid diet.  Spouse and friends at bedside entire shift.

## 2017-11-22 NOTE — Progress Notes (Signed)
PT Cancellation Note  Patient Details Name: SHELLIE ROGOFF MRN: [id number] DOB: 08-02-1955   Cancelled Treatment:    Reason Eval/Treat Not Completed: Other (comment)patient reports up ad lib, ambulating.   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 Will require MD prescription to resume.    Claretha Cooper 11/22/2017, 4:43 PM Tresa Endo PT [phone]

## 2017-11-22 NOTE — Progress Notes (Signed)
[facility] Progress Note  14 Days Post-Op  Subjective: CC-  States that she did not sleep well last night, continues to have some pain and is tired of being in the hospital. Did not ask for any pain medication. Enjoyed spending time outside yesterday. Feels like her appetite is improving and she is eating as much or more than she was prior to surgery. She ate >50% of her breakfast this morning, Pro-stat x2 yesterday, and Orgain supplements from home. Colostomy functioning.   Objective: Vital signs in last 24 hours: Temp:  [97.9 F (36.6 C)-98 F (36.7 C)] 97.9 F (36.6 C) (06/12 0557) Pulse Rate:  [106-117] 106 (06/12 0557) Resp:  [18] 18 (06/12 0557) BP: (126-138)/(69-99) 126/69 (06/12 0557) SpO2:  [100 %] 100 % (06/12 0557) Weight:  [117 lb (53.1 kg)] 117 lb (53.1 kg) (06/12 0500) Last BM Date: 11/21/17  Intake/Output from previous day: 06/11 0701 - 06/12 0700 In: 1498.7 [P.O.:200; I.V.:1198.7; IV Piggyback:100] Out: 3650 [Urine:3100; Stool:550] Intake/Output this shift: Total I/O In: 50 [I.V.:50] Out: 150 [Urine:150]  PE: Gen: Alert, NAD, pleasant Pulm: Normal effort GLO:VFIE,PPIRJJOAC, nontender,open midline incision pink with no drainage or surrounding erythema.G-tube clamped, ostomy pink with with gasand liquid stoolin pouch Skin: warm and dry, no rashes  Ext: 2+ DP pulses. 1+ pitting edema BLE Psych: A&Ox3  Lab Results:  Recent Labs    11/22/17 0500 11/22/17 0818  WBC SPECIMEN CONTAMINATED, UNABLE TO PERFORM TEST(S). 16.1*  HGB SPECIMEN CONTAMINATED, UNABLE TO PERFORM TEST(S). 9.1*  HCT SPECIMEN CONTAMINATED, UNABLE TO PERFORM TEST(S). 26.3*  PLT SPECIMEN CONTAMINATED, UNABLE TO PERFORM TEST(S). 886*   BMET Recent Labs    11/21/17 0432 11/22/17 0818  NA 133* 133*  K 3.6 3.6  CL 92* 93*  CO2 29 29  GLUCOSE 106* 97  BUN 42* 46*  CREATININE 0.90 0.91  CALCIUM 9.2 9.1   PT/INR No results for input(s): LABPROT, INR in the  last 72 hours. CMP     Component Value Date/Time   NA 133 (L) 11/22/2017 0818   NA 134 (L) 12/28/2016 1503   K 3.6 11/22/2017 0818   K 4.0 12/28/2016 1503   CL 93 (L) 11/22/2017 0818   CL 98 12/03/2012 1419   CO2 29 11/22/2017 0818   CO2 25 12/28/2016 1503   GLUCOSE 97 11/22/2017 0818   GLUCOSE 96 12/28/2016 1503   GLUCOSE 89 12/03/2012 1419   BUN 46 (H) 11/22/2017 0818   BUN 12.0 12/28/2016 1503   CREATININE 0.91 11/22/2017 0818   CREATININE 0.81 09/13/2017 1157   CREATININE 0.8 12/28/2016 1503   CALCIUM 9.1 11/22/2017 0818   CALCIUM 9.7 12/28/2016 1503   PROT 6.2 (L) 11/22/2017 0818   PROT 6.5 12/28/2016 1503   ALBUMIN 2.9 (L) 11/22/2017 0818   ALBUMIN 3.9 12/28/2016 1503   AST 39 11/22/2017 0818   AST 25 09/13/2017 1157   AST 22 12/28/2016 1503   ALT 34 11/22/2017 0818   ALT 12 09/13/2017 1157   ALT 20 12/28/2016 1503   ALKPHOS 247 (H) 11/22/2017 0818   ALKPHOS 76 12/28/2016 1503   BILITOT 0.7 11/22/2017 0818   BILITOT 0.6 09/13/2017 1157   BILITOT 0.50 12/28/2016 1503   GFRNONAA >60 11/22/2017 0818   GFRNONAA >60 09/13/2017 1157   GFRAA >60 11/22/2017 0818   GFRAA >60 09/13/2017 1157   Lipase     Component Value Date/Time   LIPASE 37 11/03/2017 1828       Studies/Results: Dg Chest 1 View  Result Date: 11/20/2017 CLINICAL DATA:  Right thoracentesis. EXAM: CHEST  1 VIEW COMPARISON:  11/16/2017, 11/20/2017 FINDINGS: Small left pleural effusion. No significant right pleural effusion. No pneumothorax. No focal consolidation. Stable cardiomediastinal silhouette. Right-sided PICC line with the tip projecting over the SVC. No acute osseous abnormality. IMPRESSION: 1. No right pneumothorax status post thoracentesis. Electronically Signed   By: Kathreen Devoid   On: 11/20/2017 11:54   US Thoracentesis Asp Pleural Space W/img Guide  Result Date: 11/20/2017 INDICATION: Patient with history of stage IV lobular breast cancer, recent small bowel resection/transverse  colostomy, ascites, bilateral pleural effusions, rectal mass. Request made for diagnostic and therapeutic left thoracentesis. EXAM: ULTRASOUND GUIDED DIAGNOSTIC AND THERAPEUTIC LEFT THORACENTESIS MEDICATIONS: None COMPLICATIONS: None immediate. PROCEDURE: An ultrasound guided thoracentesis was thoroughly discussed with the patient and questions answered. The benefits, risks, alternatives and complications were also discussed. The patient understands and wishes to proceed with the procedure. Written consent was obtained. Ultrasound was performed to localize and mark an adequate pocket of fluid in the left chest. The area was then prepped and draped in the normal sterile fashion. 1% Lidocaine was used for local anesthesia. Under ultrasound guidance a 6 Fr Safe-T-Centesis catheter was introduced. Thoracentesis was performed. The catheter was removed and a dressing applied. FINDINGS: A total of approximately 170 cc of yellow fluid was removed. Samples were sent to the laboratory as requested by the clinical team. IMPRESSION: Successful ultrasound guided diagnostic and therapeutic left thoracentesis yielding 170 cc of pleural fluid. Read by: Rowe Robert, PA-C Electronically Signed   By: Markus Daft M.D.   On: 11/20/2017 11:28    Anti-infectives: Anti-infectives (From admission, onward)   Start     Dose/Rate Route Frequency Ordered Stop   11/17/17 1030  piperacillin-tazobactam (ZOSYN) IVPB 3.375 g     3.375 g 12.5 mL/hr over 240 Minutes Intravenous Every 8 hours 11/17/17 1016     11/09/17 0000  cefoTEtan (CEFOTAN) 2 g in sodium chloride 0.9 % 100 mL IVPB     2 g 200 mL/hr over 30 Minutes Intravenous Every 12 hours 11/08/17 1748 11/09/17 0021   11/08/17 1021  sodium chloride 0.9 % with cefoTEtan (CEFOTAN) ADS Med    Note to Pharmacy:  Marchia Meiers   : cabinet override      11/08/17 1021 11/08/17 2229   11/08/17 0600  cefoTEtan (CEFOTAN) 2 g in sodium chloride 0.9 % 100 mL IVPB  Status:  Discontinued     Note to Pharmacy:  Pharmacy may adjust dose strength for optimal dosing.   Send with patient on call to the OR.  Anesthesia to complete antibiotic administration <54min prior to incision per [facility].   2 g 200 mL/hr over 30 Minutes Intravenous On call to O.R. 11/07/17 1234 11/07/17 1433   11/08/17 0600  cefoTEtan in Dextrose 5% (CEFOTAN) IVPB 2 g     2 g Intravenous On call to O.R. 11/07/17 1505 11/08/17 1207   11/07/17 1500  cefoTEtan in Dextrose 5% (CEFOTAN) IVPB 2 g  Status:  Discontinued     2 g Intravenous On call to O.R. 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 -oncologist: Dr. Magrinat;resumedexemestane, holdingabemaciclib until after discharge.  pSBO 2/2 metastatic breast CA w/ known pelvic metastasis S/P diagnostic laparoscopy, LOA, SBR, insertion gastrostomy tube, partial colectomy new  colostomy 11/08/17 Dr. Johney Maine - POD#14 - PO pain control - scheduled tylenol and robaxin,home gabapentin/xanax,po oxycodone PRN.  -VAC removed; daily wet-to-dry dressing changes to midline wound - having bowel function,G-tube clamped -Soft diet - pepcid for reflux - mobilize/IS  Leukocytosis - likely SBP, paracentesis +ENTEROCOCCUS FAECIUM. Per medicine, on zosyn.   Malnutirion - 48 h calorie count pt only meeting ~56% of nutritional needs, but this did not include everything she was taking in. Appetite seems to be improving, appreciate dietician assistance.  FEN: SOFT DIET + Pro-Stat BID + Orgain supplements;continuecyclicTNA until tolerating enough PO VTE: SCD's, Lovenox ID: perioperative cefotetan;Zosyn 6/9>> empirically for possible PNA and SBP Foley: removed 5/31 (POD#2) Follow up: Dr. Johney Maine, Dr. [name]  Plan: Will await dietician assessment of oral intake,  but after speaking with patient I feel that she is tolerating enough PO to stop TNA. Continue soft diet and protein shakes.  Antibiotics per primary team for SBP.   LOS: 19 days    Wellington Hampshire , [facility] 11/22/2017, 8:53 AM Pager: [phone] Consults: [phone] Mon 7:00 am -11:30 AM Tues-Fri 7:00 am-4:30 pm Sat-Sun 7:00 am-11:30 am

## 2017-11-23 DIAGNOSIS — C50911 Malignant neoplasm of unspecified site of right female breast: Secondary | ICD-10-CM

## 2017-11-23 DIAGNOSIS — R188 Other ascites: Secondary | ICD-10-CM

## 2017-11-23 LAB — CBC WITH DIFFERENTIAL/PLATELET
BASOS PCT: 0 %
Basophils Absolute: 0.1 10*3/uL (ref 0.0–0.1)
EOS ABS: 0.1 10*3/uL (ref 0.0–0.7)
Eosinophils Relative: 1 %
HEMATOCRIT: 26.9 % — AB (ref 36.0–46.0)
HEMOGLOBIN: 8.9 g/dL — AB (ref 12.0–15.0)
Lymphocytes Relative: 8 %
Lymphs Abs: 1.3 10*3/uL (ref 0.7–4.0)
MCH: 28.7 pg (ref 26.0–34.0)
MCHC: 33.1 g/dL (ref 30.0–36.0)
MCV: 86.8 fL (ref 78.0–100.0)
MONOS PCT: 7 %
Monocytes Absolute: 1.1 10*3/uL — ABNORMAL HIGH (ref 0.1–1.0)
NEUTROS ABS: 13.4 10*3/uL — AB (ref 1.7–7.7)
NEUTROS PCT: 84 %
Platelets: 989 10*3/uL (ref 150–400)
RBC: 3.1 MIL/uL — ABNORMAL LOW (ref 3.87–5.11)
RDW: 15.4 % (ref 11.5–15.5)
WBC: 16 10*3/uL — ABNORMAL HIGH (ref 4.0–10.5)

## 2017-11-23 LAB — BASIC METABOLIC PANEL
ANION GAP: 14 (ref 5–15)
BUN: 42 mg/dL — ABNORMAL HIGH (ref 6–20)
CALCIUM: 9.4 mg/dL (ref 8.9–10.3)
CHLORIDE: 93 mmol/L — AB (ref 101–111)
CO2: 27 mmol/L (ref 22–32)
CREATININE: 1.09 mg/dL — AB (ref 0.44–1.00)
GFR calc Af Amer: >60 mL/min (ref >60)
GFR calc non Af Amer: 53 mL/min — ABNORMAL LOW (ref >60)
Glucose, Bld: 117 mg/dL — ABNORMAL HIGH (ref 65–99)
Potassium: 3 mmol/L — ABNORMAL LOW (ref 3.5–5.1)
SODIUM: 134 mmol/L — AB (ref 135–145)

## 2017-11-23 NOTE — Progress Notes (Signed)
PROGRESS NOTE    Sonia Hill  EQA:[id number] DOB: 12/17/1955 DOA: 11/03/2017 PCP: [name], MD   Brief Narrative:  The patient is a 75 female with metastatic breast cancer who sees Dr. [name] - on Verzenio, Hx of Colostomy + rectal mass 04/2017-complicated by SBO-recurrent SBO 06/2017 and currently on liquid diet at home. She was admitted with severe excruciating abdominal pain no ostomy output. CT Abdomen showed worsening cancer. Underwent Surgery on 5/29 and had Diagnostic Laparoscopy, Lysis of Adhesions, Small Bowel Resection, Insertion of Gastrotomy Tube and Partial Colectomy with new Colostomy. Currently on TPN and diet being advanced to soft Diet and is doing better. CT of Abdomen and CXR show possible PNA so patient was started on Empiric Zosyn. Patient underwent Thoracentesis x 2 (Left and Right) and Paracentesis which found Enterococcus SBP. Pt noted to be grossly volume overloaded but is improving slowly with diuresis and has diurese 12 liters so far. Currently tolerating orally.   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 Johney Maine 11/08/2017.   NG tube and TPN has been discontinued per General Surgery.   Tolerating Soft Diet Gen surg on board  Hyponatremia, improved   Patient noted to have lower extremity edema as well as low albumin levels.   TSH within normal limits.   Cortisol level within normal limits.  Nephrology consulted to assessed the patient and feel hyponatremia may be secondary to hypervolemia with anasarca.   S/P IV albumin and low-dose salt tablets Continue compression stockings Continue IV Furosemide 80 mg BID, will switch to PO in am  Hypokalemia/hypocalcemia/hypomagnesemia Replace prn  Hypothyroidism  TSH 3.123 C/w po 100 mcg po Daily.    Metastatic estrogen positive lobular breast cancer Patient with metastatic spread to the spine and rectal area resulting in blockage and diverging colostomy.   Oncology following- Dr. [name] recommending resuming Exemestane at this point and continue to hold Abemaciclib   Solitary kidney with Hydronephrosis -Status post Foley catheter placement.   -Renal function stable. Urology consulted and no further work-up needed during this hospitalization. -Outpatient follow-up with Urology.    Anxiety -Ativan as needed.  Severe protein calorie malnutrition - s/p TPN per General Surgery  -Nutrition is consulted and appreciate further evaluation recommendations -Continue Ensure Enlive 237 mL's p.o. 3 times daily with meals  Volume Overload/Anasarca -Patient with lower extremity edema, abdominal swelling and anasarca  -Likely component of hypoalbuminemia. -Resumed IV Lasix at 80 mg BID and will hold po doses  -Urine output not accurately recorded.   -Continue to MonitorStrict I's and O's.  Daily weights.  New left bundle  branch block -Noted on EKG the night of 11/08/2017.   -Patient denies any chest pain no shortness  of breath.   -2D echo with a EF of 65 to 70%, no wall motion abnormalities, left ventricular diastolic function normal.   -Patient has been seen in consultation by Cardiology and no further ischemic work-up recommended at this time.   Normocytic Anemia -Patient is status post 2 units packed red blood cells 11/12/2017.  -Patient denies any overt bleeding.   -Colostomy with small brown stool.  FOBT was positive.  Anemia panel has been ordered and looks like patient does have a severe iron deficiency anemia with iron level less than 5.   -IV Feraheme x1.  LDH, haptoglobin, hepatic panel not consistent with hemolysis   -Patient was seen in consultation by Gastroenterology who are recommending serial CBCs.  Transfuse as needed.   -Daily CBC  Leukocytosis likely in the setting of PNA and SBP and suspect SBP given Enterococcus in Fluid Cx  Not improving, no fever noted Urinalysis Rechecked along with Urine Culture and Urinalysis Negative  Last Urine Cx on 6/1 showed 30,000 CFU of Pseudomonas Aeruginosa; Repeat Cx showed Multiple Species Present  Repeat Blood Cultures showed NGTD at 4 days Paracentesis done and showed Cloudy Appearance with 2837 WBC's and 82 Neutrophils with other Cx studies showing ENTEROCOCCUS FAECIUM with Susceptibilities to Follow  Left and Right Thoracentesis done  C/w Empiric Zosyn Continue to monitor CBC for now.  Abnormal LFT's/Elevated Alk Phos,improving  -AST was 108, ALT was 108, and Alk Phos was 471; Now improved and AST is 39, ALT is 31 and Alk Phos is 293 -Likely from TNA/TPN  Hypokalemia Replace prn  Thrombocytosis Likely worsening in the setting of Infection with Enterococcus Faecium Platelet Count went from 401 -> 549 -> 636 -> 800-->989 Daily CBC      DVT prophylaxis: SCDs Code Status: FULL CODE Family Communication: Discussed with family at bedside  Disposition Plan: Anticipate D/C Home likely 11/24/17  Consultants:   General Surgery: [name]  11/04/2017  Palliative care: Dr. Rowe Pavy 10/30/2017  Cardiology: Dr.Skains 11/10/2017  Nephrology: Dr: Jonnie Finner 11/11/2017  Gastroenterology: Dr. Cristina Gong 11/12/2017  Urology: Dr. Alinda Money 11/13/2017  Pharmacy for TNA  Procedures: S/P diagnostic laparoscopy, LOA, SBR, insertion gastrostomy tube, partial colectomy new colostomy 11/08/17 Dr. Johney Maine  Throacentesis Right - Drained 230 mL Thoracentesis Left - Drained 130 mL  Paracentesis - Removed 570 mL  Antimicrobials:  Anti-infectives (From admission, onward)   Start     Dose/Rate Route Frequency Ordered Stop   11/17/17 1030  piperacillin-tazobactam (ZOSYN) IVPB 3.375 g     3.375 g 12.5 mL/hr over 240 Minutes Intravenous Every 8 hours 11/17/17 1016     11/09/17 0000  cefoTEtan (CEFOTAN) 2 g in sodium chloride 0.9 % 100 mL IVPB     2 g 200 mL/hr over 30 Minutes Intravenous Every 12 hours 11/08/17 1748 11/09/17 0021   11/08/17 1021  sodium chloride 0.9 % with cefoTEtan (CEFOTAN) ADS Med    Note to Pharmacy:  Marchia Meiers   : cabinet override      11/08/17 1021 11/08/17 2229   11/08/17 0600  cefoTEtan (CEFOTAN) 2 g in sodium chloride 0.9 % 100 mL IVPB  Status:  Discontinued    Note to Pharmacy:  Pharmacy may adjust dose strength for optimal dosing.   Send with patient on call to the OR.  Anesthesia to complete antibiotic administration <[facility]   2 g 200  mL/hr over 30 Minutes Intravenous On call to O.R. 11/07/17 1234 11/07/17 1433   11/08/17 0600  cefoTEtan in Dextrose 5% (CEFOTAN) IVPB 2 g     2 g Intravenous On call to O.R. 11/07/17 1505 11/08/17 1207   11/07/17 1500  cefoTEtan in Dextrose 5% (CEFOTAN) IVPB 2 g  Status:  Discontinued     2 g Intravenous On call to O.R. 11/07/17 1433 11/07/17 1505     Subjective: No new complaints.  Patient is tolerating oral feeds without much problems.  Objective: Vitals:   11/22/17 2107 11/23/17 0500 11/23/17 0528 11/23/17 1244  BP: (!) 109/54  126/70 129/66    Pulse: (!) 106  96 (!) 104  Resp: '18  18 17  ' Temp: 97.7 F (36.5 C)  98 F (36.7 C) 98.7 F (37.1 C)  TempSrc:   Oral Oral  SpO2: 100%  100% 100%  Weight:  52.1 kg (114 lb 12.8 oz)    Height:        Intake/Output Summary (Last 24 hours) at 11/23/2017 1758 Last data filed at 11/23/2017 1543 Gross per 24 hour  Intake 213.76 ml  Output 1650 ml  Net -1436.24 ml   Filed Weights   11/21/17 0500 11/22/17 0500 11/23/17 0500  Weight: 53.7 kg (118 lb 6.4 oz) 53.1 kg (117 lb) 52.1 kg (114 lb 12.8 oz)   Examination: Physical Exam:  Constitutional: Cachectic.  Not in distress.   Eyes: There are anicteric.  Lids and conjunctive are normal. ENMT: External ears and nose appear normal.  Grossly normal hearing Neck: Supple with no JVD Respiratory: Diminished to auscultation bilaterally with mild crackles.  No appreciable wheezing, rales, rhonchi.  Unlabored breathing is not using accessory muscles to breathe Cardiovascular: Tachycardic rate and rhythm.  No appreciable murmurs, rubs, gallops.  Has 2+ lower extremity edema and still remains in a stocking Abdomen: Soft and tender slightly to palpation.  Remains distended with abdominal wall edema and is slightly worse today.  Colostomy bag in place with aggressive distention and some liquid stool.  Abdominal scarring is noted and she does have a G-tube in place Musculoskeletal: No contractures or cyanosis noted.  No joint deformity noted.  Has a right PICC line in place Skin: Skin is warm and dry no appreciable rashes or lesions limited skin evaluation Neurologic: Awake, alert and oriented to time, place and person.  Patient moves all limbs.   Psychiatric: Normal mood and pleasant affect.  Intact judgment and insight.  Patient is awake, alert, and oriented x3.  Data Reviewed: I have personally reviewed following labs and imaging studies  CBC: Recent Labs  Lab 11/20/17 0444 11/21/17 0432 11/22/17 0500 11/22/17 0818 11/23/17 0851  WBC 15.6*  16.4* SPECIMEN CONTAMINATED, UNABLE TO PERFORM TEST(S). 16.1* 16.0*  NEUTROABS 13.3* 13.9* SPECIMEN CONTAMINATED, UNABLE TO PERFORM TEST(S). 13.4* 13.4*  HGB 8.7* 8.6* SPECIMEN CONTAMINATED, UNABLE TO PERFORM TEST(S). 9.1* 8.9*  HCT 25.6* 26.1* SPECIMEN CONTAMINATED, UNABLE TO PERFORM TEST(S). 26.3* 26.9*  MCV 85.9 85.9 SPECIMEN CONTAMINATED, UNABLE TO PERFORM TEST(S). 85.7 86.8  PLT 636* 800* SPECIMEN CONTAMINATED, UNABLE TO PERFORM TEST(S). 886* 115*   Basic Metabolic Panel: Recent Labs  Lab 11/18/17 0413 11/19/17 0519 11/20/17 0444 11/21/17 0432 11/22/17 0818 11/23/17 0851  NA 129* 131* 132* 133* 133* 134*  K 3.2* 3.6 3.2* 3.6 3.6 3.0*  CL 90* 91* 92* 92* 93* 93*  CO2 '28 28 29 29 29 27  ' GLUCOSE 106* 119* 103* 106* 97 117*  BUN 29* 34* 39*  42* 46* 42*  CREATININE 0.67 0.92 0.89 0.90 0.91 1.09*  CALCIUM 8.6* 9.0 9.1 9.2 9.1 9.4  MG 2.1 2.2 2.1 2.0 2.1  --   PHOS 3.7 3.9 4.1 3.8 4.6  --    GFR: Estimated Creatinine Clearance: 38.4 mL/min (A) (by C-G formula based on SCr of 1.09 mg/dL (H)). Liver Function Tests: Recent Labs  Lab 11/18/17 0413 11/19/17 0519 11/20/17 0444 11/21/17 0432 11/22/17 0818  AST 31 30 35 39 39  ALT 36 '28 28 31 ' 34  ALKPHOS 323* 262* 303* 293* 247*  BILITOT 1.3* 1.3* 1.0 1.0 0.7  PROT 5.7* 6.1* 5.9* 6.1* 6.2*  ALBUMIN 3.0* 3.4* 3.3* 3.0* 2.9*   No results for input(s): LIPASE, AMYLASE in the last 168 hours. No results for input(s): AMMONIA in the last 168 hours. Coagulation Profile: No results for input(s): INR, PROTIME in the last 168 hours. Cardiac Enzymes: No results for input(s): CKTOTAL, CKMB, CKMBINDEX, TROPONINI in the last 168 hours. BNP (last 3 results) No results for input(s): PROBNP in the last 8760 hours. HbA1C: No results for input(s): HGBA1C in the last 72 hours. CBG: No results for input(s): GLUCAP in the last 168 hours. Lipid Profile: No results for input(s): CHOL, HDL, LDLCALC, TRIG, CHOLHDL, LDLDIRECT in the last 72  hours. Thyroid Function Tests: No results for input(s): TSH, T4TOTAL, FREET4, T3FREE, THYROIDAB in the last 72 hours. Anemia Panel: No results for input(s): VITAMINB12, FOLATE, FERRITIN, TIBC, IRON, RETICCTPCT in the last 72 hours. Sepsis Labs: No results for input(s): PROCALCITON, LATICACIDVEN in the last 168 hours.  Recent Results (from the past 240 hour(s))  Culture, Urine     Status: Abnormal   Collection Time: 11/16/17 10:55 AM  Result Value Ref Range Status   Specimen Description   Final    URINE, CLEAN CATCH Performed at [facility], [place] [address], [place], [place] [address]    Special Requests   Final    NONE Performed at [facility], [place] [address] [place], [place] [address]    Culture MULTIPLE SPECIES PRESENT, SUGGEST RECOLLECTION (A)  Final   Report Status 11/17/2017 FINAL  Final  Culture, blood (routine x 2)     Status: None   Collection Time: 11/16/17  9:02 PM  Result Value Ref Range Status   Specimen Description   Final    BLOOD RIGHT HAND Performed at [name] [address], [place], [place] [address]    Special Requests   Final    BOTTLES DRAWN AEROBIC AND ANAEROBIC Blood Culture adequate volume Performed at [place] [address], [place], [place] [address]    Culture   Final    NO GROWTH 5 DAYS Performed at [facility], [place] [address], [employer], [place] [address]    Report Status 11/22/2017 FINAL  Final  Culture, blood (routine x 2)     Status: None   Collection Time: 11/17/17 12:18 AM  Result Value Ref Range Status   Specimen Description   Final    BLOOD RIGHT HAND Performed at [place] [address], [place],  [address]    Special Requests   Final    BOTTLES DRAWN AEROBIC AND ANAEROBIC Blood Culture adequate volume Performed at [place] [address], [place],  [address]    Culture   Final     NO GROWTH 5 DAYS Performed at [facility], [place] [address]  [place], [place] [address]    Report Status 11/22/2017 FINAL  Final  Fungus Culture With Stain     Status: None (Preliminary result)   Collection Time: 11/17/17  5:13 PM  Result Value Ref Range Status   Fungus Stain Final report  Final    Comment: (NOTE) Performed At: [facility] [place], [place] [id number] Rush Farmer MD [id number]    Fungus (Mycology) Culture PENDING  Incomplete   Fungal Source PERITONEAL  Final    Comment: Performed at [facility], [place] [address], [place], [place] [address]  Culture, body fluid-bottle     Status: Abnormal   Collection Time: 11/17/17  5:13 PM  Result Value Ref Range Status   Specimen Description PERITONEAL  Final   Special Requests BOTTLES DRAWN AEROBIC AND ANAEROBIC 10CC  Final   Gram Stain   Final    GRAM POSITIVE COCCI IN CHAINS IN BOTH AEROBIC AND ANAEROBIC BOTTLES CRITICAL RESULT CALLED TO, READ BACK BY AND VERIFIED WITH: EDWARDS RN AT 4235 ON [id number] BY SJW Performed at [facility], [place] [address] [place], [place] [address]    Culture ENTEROCOCCUS FAECIUM (A)  Final   Report Status 11/22/2017 FINAL  Final   Organism ID, Bacteria ENTEROCOCCUS FAECIUM  Final      Susceptibility   Enterococcus faecium - MIC*    AMPICILLIN <=2 SENSITIVE Sensitive     VANCOMYCIN <=0.5 SENSITIVE Sensitive     GENTAMICIN SYNERGY SENSITIVE Sensitive     * ENTEROCOCCUS FAECIUM  Gram stain     Status: None   Collection Time: 11/17/17  5:13 PM  Result Value Ref Range Status   Specimen Description PERITONEAL  Final   Special Requests NONE  Final   Gram Stain   Final    FEW WBC PRESENT, PREDOMINANTLY PMN NO ORGANISMS SEEN Performed at [facility], [place] [address] [place], [place] [address]    Report Status 11/17/2017 FINAL  Final  Fungus Culture Result     Status: None   Collection Time: 11/17/17  5:13 PM  Result Value Ref  Range Status   Result 1 Comment  Final    Comment: (NOTE) KOH/Calcofluor preparation:  no fungus observed. Performed At: [facility] [place], [place] [id number] Rush Farmer MD IW:[id number] Performed at [facility], [place] [address], [place], [place] [address]   Fungus Culture With Stain     Status: None (Preliminary result)   Collection Time: 11/18/17  1:54 PM  Result Value Ref Range Status   Fungus Stain Final report  Final    Comment: (NOTE) Performed At: [facility] [place], [place] [id number] Rush Farmer MD [id number] Performed at [facility], [place] [address] [place], [place] [address]    Fungus (Mycology) Culture PENDING  Incomplete  Culture, body fluid-bottle     Status: None (Preliminary result)   Collection Time: 11/18/17  1:54 PM  Result Value Ref Range Status   Specimen Description FLUID RIGHT PLEURAL  Final   Special Requests BOTTLES DRAWN AEROBIC AND ANAEROBIC  Final   Culture   Final    NO GROWTH 4 DAYS Performed at [facility], [place] [address], [place],  [address]    Report Status PENDING  Incomplete  Gram stain     Status: None   Collection Time: 11/18/17  1:54 PM  Result Value Ref Range Status   Specimen Description FLUID RIGHT PLEURAL  Final   Special Requests NONE  Final   Gram Stain   Final    RARE WBC PRESENT, PREDOMINANTLY MONONUCLEAR NO ORGANISMS SEEN Performed at [facility], [place] [address] [place], [place] [address]    Report Status 11/19/2017 FINAL  Final  Fungus Culture Result     Status: None   Collection Time: 11/18/17  1:54 PM  Result Value Ref Range Status   Result 1 Comment  Final    Comment: (NOTE) KOH/Calcofluor preparation:  no fungus observed. Performed At: [facility] [place], [place] [id number] Rush Farmer MD [id number] Performed at [facility], [place] [address] [place], [place] [address]   Culture, body fluid-bottle     Status: None (Preliminary result)   Collection Time: 11/20/17 11:27 AM  Result Value Ref Range Status   Specimen Description FLUID  Final   Special Requests NONE  Final   Culture   Final    NO GROWTH 3 DAYS Performed at [place] [address], [place], [place] [address]    Report Status PENDING  Incomplete    Radiology Studies: No results found.  Scheduled Meds: . acetaminophen  1,000 mg Oral Q8H  . exemestane  25 mg Oral QPC breakfast  . famotidine  20 mg Oral BID  . feeding supplement (PRO-STAT SUGAR FREE 64)  30 mL Oral BID  . furosemide  80 mg Intravenous Q12H  . gabapentin  300 mg Oral TID  . levothyroxine  100 mcg Oral QAC breakfast  . lip balm  1 application Topical BID  . LORazepam  0.5 mg Intravenous Q24H  . methocarbamol  500 mg Oral Q8H  . multivitamin with minerals  1 tablet Oral Daily  . saccharomyces boulardii  250 mg Oral BID   Continuous Infusions: . piperacillin-tazobactam (ZOSYN)  IV Stopped (11/23/17 1516)    LOS: 20 days   Alma Friendly, MD [facility]  If 7PM-7AM, please contact night-coverage [email or web address] Password [facility] 11/23/2017, 5:58 PM

## 2017-11-23 NOTE — Progress Notes (Signed)
[facility] Progress Note  15 Days Post-Op  Subjective: CC:  Pt reports feeling great. States she is eating well. She is becoming more independent and able to get herself in and out of bed. Having colostomy output. Pain controlled.   Objective: Vital signs in last 24 hours: Temp:  [97.7 F (36.5 C)-98.1 F (36.7 C)] 98 F (36.7 C) (06/13 0528) Pulse Rate:  [96-116] 96 (06/13 0528) Resp:  [16-18] 18 (06/13 0528) BP: (109-127)/(54-82) 126/70 (06/13 0528) SpO2:  [98 %-100 %] 100 % (06/13 0528) Weight:  [52.1 kg (114 lb 12.8 oz)] 52.1 kg (114 lb 12.8 oz) (06/13 0500) Last BM Date: 11/22/17  Intake/Output from previous day: 06/12 0701 - 06/13 0700 In: 203.1 [I.V.:50; IV Piggyback:153.1] Out: 1200 [Urine:950; Stool:250] Intake/Output this shift: No intake/output data recorded.  PE: Gen: Alert, NAD, pleasant Pulm: Normal effort PJK:DTOI,ZTIWPYKDX, nontender,open midline incision pink with no drainage or surrounding erythema.G-tube clamped, ostomy pink with with gasand liquid stoolin pouch Skin: warm and dry, no rashes  Ext: 2+ DP pulses. 1+ pitting edema BLE Psych: A&Ox3  Lab Results:  Recent Labs    11/22/17 0500 11/22/17 0818  WBC SPECIMEN CONTAMINATED, UNABLE TO PERFORM TEST(S). 16.1*  HGB SPECIMEN CONTAMINATED, UNABLE TO PERFORM TEST(S). 9.1*  HCT SPECIMEN CONTAMINATED, UNABLE TO PERFORM TEST(S). 26.3*  PLT SPECIMEN CONTAMINATED, UNABLE TO PERFORM TEST(S). 886*   BMET Recent Labs    11/21/17 0432 11/22/17 0818  NA 133* 133*  K 3.6 3.6  CL 92* 93*  CO2 29 29  GLUCOSE 106* 97  BUN 42* 46*  CREATININE 0.90 0.91  CALCIUM 9.2 9.1   PT/INR No results for input(s): LABPROT, INR in the last 72 hours. CMP     Component Value Date/Time   NA 133 (L) 11/22/2017 0818   NA 134 (L) 12/28/2016 1503   K 3.6 11/22/2017 0818   K 4.0 12/28/2016 1503   CL 93 (L) 11/22/2017 0818   CL 98 12/03/2012 1419   CO2 29 11/22/2017 0818   CO2 25 12/28/2016  1503   GLUCOSE 97 11/22/2017 0818   GLUCOSE 96 12/28/2016 1503   GLUCOSE 89 12/03/2012 1419   BUN 46 (H) 11/22/2017 0818   BUN 12.0 12/28/2016 1503   CREATININE 0.91 11/22/2017 0818   CREATININE 0.81 09/13/2017 1157   CREATININE 0.8 12/28/2016 1503   CALCIUM 9.1 11/22/2017 0818   CALCIUM 9.7 12/28/2016 1503   PROT 6.2 (L) 11/22/2017 0818   PROT 6.5 12/28/2016 1503   ALBUMIN 2.9 (L) 11/22/2017 0818   ALBUMIN 3.9 12/28/2016 1503   AST 39 11/22/2017 0818   AST 25 09/13/2017 1157   AST 22 12/28/2016 1503   ALT 34 11/22/2017 0818   ALT 12 09/13/2017 1157   ALT 20 12/28/2016 1503   ALKPHOS 247 (H) 11/22/2017 0818   ALKPHOS 76 12/28/2016 1503   BILITOT 0.7 11/22/2017 0818   BILITOT 0.6 09/13/2017 1157   BILITOT 0.50 12/28/2016 1503   GFRNONAA >60 11/22/2017 0818   GFRNONAA >60 09/13/2017 1157   GFRAA >60 11/22/2017 0818   GFRAA >60 09/13/2017 1157   Lipase     Component Value Date/Time   LIPASE 37 11/03/2017 1828   Studies/Results: No results found.  Anti-infectives: Anti-infectives (From admission, onward)   Start     Dose/Rate Route Frequency Ordered Stop   11/17/17 1030  piperacillin-tazobactam (ZOSYN) IVPB 3.375 g     3.375 g 12.5 mL/hr over 240 Minutes Intravenous Every 8 hours 11/17/17 1016  11/09/17 0000  cefoTEtan (CEFOTAN) 2 g in sodium chloride 0.9 % 100 mL IVPB     2 g 200 mL/hr over 30 Minutes Intravenous Every 12 hours 11/08/17 1748 11/09/17 0021   11/08/17 1021  sodium chloride 0.9 % with cefoTEtan (CEFOTAN) ADS Med    Note to Pharmacy:  Marchia Meiers   : cabinet override      11/08/17 1021 11/08/17 2229   11/08/17 0600  cefoTEtan (CEFOTAN) 2 g in sodium chloride 0.9 % 100 mL IVPB  Status:  Discontinued    Note to Pharmacy:  Pharmacy may adjust dose strength for optimal dosing.   Send with patient on call to the OR.  Anesthesia to complete antibiotic administration <62min prior to incision per [facility].   2 g 200 mL/hr over 30 Minutes  Intravenous On call to O.R. 11/07/17 1234 11/07/17 1433   11/08/17 0600  cefoTEtan in Dextrose 5% (CEFOTAN) IVPB 2 g     2 g Intravenous On call to O.R. 11/07/17 1505 11/08/17 1207   11/07/17 1500  cefoTEtan in Dextrose 5% (CEFOTAN) IVPB 2 g  Status:  Discontinued     2 g Intravenous On call to O.R. 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 -oncologist: Dr. Magrinat;resumedexemestane, holdingabemaciclib until after discharge.  pSBO 2/2 metastatic breast CA w/ known pelvic metastasis S/P diagnostic laparoscopy, LOA, SBR, insertion gastrostomy tube, partial colectomy new colostomy 11/08/17 Dr. Johney Maine - POD#15 - PO pain control - scheduled tylenol and robaxin,home gabapentin/xanax,po oxycodone PRN.  -VAC removed; daily wet-to-dry dressing changes to midline wound - having bowel function,G-tube clamped -Soft diet - pepcid for reflux - mobilize/IS - stable for discharge from surgical perspective  Leukocytosis - likely SBP, paracentesis +ENTEROCOCCUS FAECIUM. Per medicine, on zosyn.   Malnutirion - TNA d/c-ed 6/12; PO intake improved significantly s/p paracentesis. Follow PO intake closely. Pt uses myfitnesspal to track intake.   FEN: SOFT DIET + Pro-Stat BID + Orgain supplements;TNA D/C-ed 6/12, CCS discussed with Dr. [name] yesterday - continue PICC line at discharge for possible resumption of TNA and blood draws; PICC placed 5/25 - does this need to be exchanged prior to D/C? VTE: SCD's, Lovenox ID: perioperative cefotetan;Zosyn 6/9>> empirically for possible PNA and SBP Foley: removed 5/31 (POD#2) Follow up: Dr. Johney Maine, Dr. [name]   LOS: 37 days    Jill Alexanders , [facility] 11/23/2017, 7:32 AM Pager: [phone] Consults: [phone] Mon-Fri  7:00 am-4:30 pm Sat-Sun 7:00 am-11:30 am

## 2017-11-24 DIAGNOSIS — C801 Malignant (primary) neoplasm, unspecified: Secondary | ICD-10-CM

## 2017-11-24 DIAGNOSIS — E876 Hypokalemia: Secondary | ICD-10-CM

## 2017-11-24 DIAGNOSIS — C785 Secondary malignant neoplasm of large intestine and rectum: Secondary | ICD-10-CM

## 2017-11-24 DIAGNOSIS — E871 Hypo-osmolality and hyponatremia: Secondary | ICD-10-CM

## 2017-11-24 DIAGNOSIS — C784 Secondary malignant neoplasm of small intestine: Principal | ICD-10-CM

## 2017-11-24 LAB — CBC WITH DIFFERENTIAL/PLATELET
Basophils Absolute: 0.1 10*3/uL (ref 0.0–0.1)
Basophils Relative: 1 %
EOS ABS: 0.1 10*3/uL (ref 0.0–0.7)
EOS PCT: 1 %
HCT: 27 % — ABNORMAL LOW (ref 36.0–46.0)
Hemoglobin: 8.9 g/dL — ABNORMAL LOW (ref 12.0–15.0)
LYMPHS ABS: 1.4 10*3/uL (ref 0.7–4.0)
Lymphocytes Relative: 9 %
MCH: 28.8 pg (ref 26.0–34.0)
MCHC: 33 g/dL (ref 30.0–36.0)
MCV: 87.4 fL (ref 78.0–100.0)
MONOS PCT: 9 %
Monocytes Absolute: 1.4 10*3/uL — ABNORMAL HIGH (ref 0.1–1.0)
Neutro Abs: 12.5 10*3/uL — ABNORMAL HIGH (ref 1.7–7.7)
Neutrophils Relative %: 80 %
PLATELETS: 1022 10*3/uL — AB (ref 150–400)
RBC: 3.09 MIL/uL — ABNORMAL LOW (ref 3.87–5.11)
RDW: 15.6 % — AB (ref 11.5–15.5)
WBC: 15.5 10*3/uL — AB (ref 4.0–10.5)

## 2017-11-24 LAB — BASIC METABOLIC PANEL
Anion gap: 14 (ref 5–15)
BUN: 37 mg/dL — AB (ref 6–20)
CO2: 27 mmol/L (ref 22–32)
CREATININE: 1.08 mg/dL — AB (ref 0.44–1.00)
Calcium: 9.5 mg/dL (ref 8.9–10.3)
Chloride: 96 mmol/L — ABNORMAL LOW (ref 101–111)
GFR calc Af Amer: >60 mL/min (ref >60)
GFR, EST NON AFRICAN AMERICAN: 54 mL/min — AB (ref >60)
Glucose, Bld: 95 mg/dL (ref 65–99)
Potassium: 2.9 mmol/L — ABNORMAL LOW (ref 3.5–5.1)
SODIUM: 137 mmol/L (ref 135–145)

## 2017-11-24 LAB — PATHOLOGIST SMEAR REVIEW

## 2017-11-24 LAB — CULTURE, BODY FLUID-BOTTLE: CULTURE: NO GROWTH

## 2017-11-24 LAB — CULTURE, BODY FLUID W GRAM STAIN -BOTTLE

## 2017-11-24 MED ORDER — OXYCODONE HCL 5 MG PO TABS: 5.0000 mg | ORAL_TABLET | Freq: Four times a day (QID) | ORAL | 0 refills | Status: DC | PRN

## 2017-11-24 MED ORDER — FUROSEMIDE 40 MG PO TABS: 40.0000 mg | ORAL_TABLET | Freq: Two times a day (BID) | ORAL | 0 refills | Status: DC

## 2017-11-24 MED ORDER — AMOXICILLIN-POT CLAVULANATE 875-125 MG PO TABS: 1.0000 | ORAL_TABLET | Freq: Two times a day (BID) | ORAL | 0 refills | Status: AC

## 2017-11-24 MED ORDER — HEPARIN SOD (PORK) LOCK FLUSH 100 UNIT/ML IV SOLN: 250.0000 [IU] | Freq: Every day | INTRAVENOUS | 0 refills | Status: DC

## 2017-11-24 MED ORDER — OXYCODONE HCL 5 MG PO TABS: 5.0000 mg | ORAL_TABLET | Freq: Four times a day (QID) | ORAL | 0 refills | Status: AC | PRN

## 2017-11-24 MED ORDER — HEPARIN SOD (PORK) LOCK FLUSH 100 UNIT/ML IV SOLN
250.0000 [IU] | INTRAVENOUS | Status: AC | PRN
  Administered 2017-11-24: 250 [IU]

## 2017-11-24 MED ORDER — POTASSIUM CHLORIDE CRYS ER 20 MEQ PO TBCR
40.0000 meq | EXTENDED_RELEASE_TABLET | Freq: Two times a day (BID) | ORAL | Status: DC
  Administered 2017-11-24: 40 meq via ORAL
  Filled 2017-11-24: qty 2

## 2017-11-24 MED ORDER — SODIUM CHLORIDE 0.9 % IV SOLN
510.0000 mg | Freq: Once | INTRAVENOUS | Status: AC
  Administered 2017-11-24: 510 mg via INTRAVENOUS
  Filled 2017-11-24: qty 17

## 2017-11-24 MED ORDER — AMOXICILLIN-POT CLAVULANATE 875-125 MG PO TABS: 1.0000 | ORAL_TABLET | Freq: Two times a day (BID) | ORAL | 0 refills | Status: DC

## 2017-11-24 MED ORDER — AMOXICILLIN-POT CLAVULANATE 875-125 MG PO TABS
1.0000 | ORAL_TABLET | Freq: Two times a day (BID) | ORAL | Status: DC
  Administered 2017-11-24: 1 via ORAL
  Filled 2017-11-24: qty 1

## 2017-11-24 NOTE — Consult Note (Addendum)
[place] Nurse ostomy follow up Stoma type/location:  Pt requested assistance with pouching systems.  She has been wearing a flat 2 piece pouch with a barrier ring while in the hospital.  This was replaced twice last night. Pt states she used a convex pouching system prior to admission.  5 extra samples of one piece flexible convex pouches ordered for use while in the hospital and for discharge home, and informed patient we do not have 2 piece convex pouches available in the hospital formulary.  She is familiar with pouch application and emptying and denies further questions regarding technique.  Since pouch was applied early this am, she declined the need for another change at this time or stoma assessment.  It is currently intact with a good seal.  Applied barrier strip next to the abd wound, where pouch was pulling away from skin related to the narrow strip of skin which separated wound and ostomy.  Provided patient with a free sample box of barrier rings and 2 sample boxes of barrier strips. Enrolled patient in [place] Start Discharge program: Yes Please re-consult if further assistance is needed.  Thank-you,  Julien Girt MSN, [place], [place], [place], [place]

## 2017-11-24 NOTE — Progress Notes (Signed)
Occupational Therapy Treatment Patient Details Name: Sonia Hill MRN: [id number] DOB: 11-01-1955 Today's Date: 11/24/2017    History of present illness 62 year old female was admitted with abdomen distention. PMH:  metastatic breast CA, colostomy, arthritis and CKD.  s/p small bowel resection, insertion of g tube, lysis of adhesions, partial colectomy and new colostomy   OT comments  Pt able to legs on bed without leg lifter   Follow Up Recommendations  Supervision/Assistance - 24 hour    Equipment Recommendations  3 in 1 bedside commode    Recommendations for Other Services      Precautions / Restrictions Precautions Precautions: Fall Precaution Comments: L colostomy, G tube       Mobility Bed Mobility Overal bed mobility: Modified Independent                Transfers Overall transfer level: Needs assistance Equipment used: None(IV pole) Transfers: Sit to/from [facility] Sit to Stand: Supervision Stand pivot transfers: Supervision            Balance Overall balance assessment: Mild deficits observed, not formally tested                                         ADL either performed or assessed with clinical judgement   ADL Overall ADL's : Needs assistance/impaired                                       General ADL Comments: Pt feeling much better. Pts BLE edema decreased.  Pt able to don socks as well as get legs on bed this day.  OT helped pt organize bathroom in a way she would take care of her bladder and bowel needs.  Educated ways to keep items within reach but not be a fall risk.      Vision Patient Visual Report: No change from baseline            Cognition Arousal/Alertness: Awake/alert Behavior During Therapy: WFL for tasks assessed/performed Overall Cognitive Status: Within Functional Limits for tasks assessed                                                      Pertinent Vitals/ Pain       Pain Score: 2  Pain Location: abdomen Pain Descriptors / Indicators: Sore Pain Intervention(s): Monitored during session     Prior Functioning/Environment              Frequency  Min 2X/week        Progress Toward Goals  OT Goals(current goals can now be found in the care plan section)        Plan Discharge plan remains appropriate       AM-PAC PT "6 Clicks" Daily Activity     Outcome Measure   Help from another person eating meals?: None Help from another person taking care of personal grooming?: None Help from another person toileting, which includes using toliet, bedpan, or urinal?: A Little Help from another person bathing (including washing, rinsing, drying)?: A Little Help from another person to put on and taking off regular upper body clothing?:  None Help from another person to put on and taking off regular lower body clothing?: A Little 6 Click Score: 21    End of Session    OT Visit Diagnosis: Unsteadiness on feet (R26.81);Muscle weakness (generalized) (M62.81)   Activity Tolerance Patient tolerated treatment well   Patient Left in bed;with call bell/phone within reach;with family/visitor present   Nurse Communication Mobility status        Time: 1130-1147 OT Time Calculation (min): 17 min  Charges: OT General Charges $OT Visit: 1 Visit OT Treatments $Self Care/Home Management : 8-22 mins  [place], [place]   Betsy Pries 11/24/2017, 12:31 PM

## 2017-11-24 NOTE — Care Management Note (Signed)
Case Management Note  Patient Details  Name: Sonia Hill MRN: [id number] Date of Birth: 11-Nov-1955  Subjective/Objective:      SBO from metastasis s/p SB resection 11/08/2017             Action/Plan: Discharged home with [place].   Expected Discharge Date:  11/24/17               Expected Discharge Plan:  [place]  In-House Referral:     Discharge planning Services  CM Consult  Post Acute Care Choice:    Choice offered to:     DME Arranged:  Bedside commode, Hospital bed DME Agency:  [place]:  RN, PT [facility] Agency:  [place]  Status of Service:  In process, will continue to follow  If discussed at Long Length of Stay Meetings, dates discussed:    Additional CommentsPurcell Mouton, RN 11/24/2017, 1:57 PM

## 2017-11-24 NOTE — Progress Notes (Signed)
[facility] Progress Note  16 Days Post-Op  Subjective: CC:  Feels good today. Concerned about colostomy pouching system because it keeps "popping open" and is in close proximity to midline wound. Tolerating PO. Working with PT and becoming more mobile/independent. Pain controlled. Denies N/V.   Objective: Vital signs in last 24 hours: Temp:  [97.8 F (36.6 C)-98.7 F (37.1 C)] 98 F (36.7 C) (06/14 0418) Pulse Rate:  [104-109] 104 (06/14 0418) Resp:  [17-20] 20 (06/14 0418) BP: (122-129)/(66-74) 122/71 (06/14 0418) SpO2:  [98 %-100 %] 98 % (06/14 0418) Last BM Date: 11/23/17  Intake/Output from previous day: 06/13 0701 - 06/14 0700 In: 700 [P.O.:600; IV Piggyback:100] Out: 700 [Urine:300; Stool:400] Intake/Output this shift: No intake/output data recorded.  PE: Gen: Alert, NAD, pleasant Pulm: Normal effort WCB:JSEG,BTDVVOHYW, nontender,midline incision with dressing c/d/i.Marland KitchenG-tube clamped, ostomypink withwith gasand liquid stoolin pouch Skin: warm and dry, no rashes Ext: 2+ DP pulses. 1+ pitting edema BLE from foot to mid-shin - this is significantly improved. Psych: A&Ox3  Lab Results:  Recent Labs    11/23/17 0851 11/24/17 0530  WBC 16.0* 15.5*  HGB 8.9* 8.9*  HCT 26.9* 27.0*  PLT 989* 1,022*   BMET Recent Labs    11/23/17 0851 11/24/17 0530  NA 134* 137  K 3.0* 2.9*  CL 93* 96*  CO2 27 27  GLUCOSE 117* 95  BUN 42* 37*  CREATININE 1.09* 1.08*  CALCIUM 9.4 9.5   PT/INR No results for input(s): LABPROT, INR in the last 72 hours. CMP     Component Value Date/Time   NA 137 11/24/2017 0530   NA 134 (L) 12/28/2016 1503   K 2.9 (L) 11/24/2017 0530   K 4.0 12/28/2016 1503   CL 96 (L) 11/24/2017 0530   CL 98 12/03/2012 1419   CO2 27 11/24/2017 0530   CO2 25 12/28/2016 1503   GLUCOSE 95 11/24/2017 0530   GLUCOSE 96 12/28/2016 1503   GLUCOSE 89 12/03/2012 1419   BUN 37 (H) 11/24/2017 0530   BUN 12.0 12/28/2016 1503   CREATININE 1.08 (H) 11/24/2017 0530   CREATININE 0.81 09/13/2017 1157   CREATININE 0.8 12/28/2016 1503   CALCIUM 9.5 11/24/2017 0530   CALCIUM 9.7 12/28/2016 1503   PROT 6.2 (L) 11/22/2017 0818   PROT 6.5 12/28/2016 1503   ALBUMIN 2.9 (L) 11/22/2017 0818   ALBUMIN 3.9 12/28/2016 1503   AST 39 11/22/2017 0818   AST 25 09/13/2017 1157   AST 22 12/28/2016 1503   ALT 34 11/22/2017 0818   ALT 12 09/13/2017 1157   ALT 20 12/28/2016 1503   ALKPHOS 247 (H) 11/22/2017 0818   ALKPHOS 76 12/28/2016 1503   BILITOT 0.7 11/22/2017 0818   BILITOT 0.6 09/13/2017 1157   BILITOT 0.50 12/28/2016 1503   GFRNONAA 54 (L) 11/24/2017 0530   GFRNONAA >60 09/13/2017 1157   GFRAA >60 11/24/2017 0530   GFRAA >60 09/13/2017 1157   Lipase     Component Value Date/Time   LIPASE 37 11/03/2017 1828       Studies/Results: No results found.  Anti-infectives: Anti-infectives (From admission, onward)   Start     Dose/Rate Route Frequency Ordered Stop   11/17/17 1030  piperacillin-tazobactam (ZOSYN) IVPB 3.375 g     3.375 g 12.5 mL/hr over 240 Minutes Intravenous Every 8 hours 11/17/17 1016     11/09/17 0000  cefoTEtan (CEFOTAN) 2 g in sodium chloride 0.9 % 100 mL IVPB     2 g 200 mL/hr over  30 Minutes Intravenous Every 12 hours 11/08/17 1748 11/09/17 0021   11/08/17 1021  sodium chloride 0.9 % with cefoTEtan (CEFOTAN) ADS Med    Note to Pharmacy:  Marchia Meiers   : cabinet override      11/08/17 1021 11/08/17 2229   11/08/17 0600  cefoTEtan (CEFOTAN) 2 g in sodium chloride 0.9 % 100 mL IVPB  Status:  Discontinued    Note to Pharmacy:  Pharmacy may adjust dose strength for optimal dosing.   Send with patient on call to the OR.  Anesthesia to complete antibiotic administration <27min prior to incision per [facility].   2 g 200 mL/hr over 30 Minutes Intravenous On call to O.R. 11/07/17 1234 11/07/17 1433   11/08/17 0600  cefoTEtan in Dextrose 5% (CEFOTAN) IVPB 2 g     2 g Intravenous On call to  O.R. 11/07/17 1505 11/08/17 1207   11/07/17 1500  cefoTEtan in Dextrose 5% (CEFOTAN) IVPB 2 g  Status:  Discontinued     2 g Intravenous On call to O.R. 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 -oncologist: Dr. Magrinat;resumedexemestane, holdingabemaciclib until after discharge.  pSBO 2/2 metastatic breast CA w/ known pelvic metastasis S/P diagnostic laparoscopy, LOA, SBR, insertion gastrostomy tube, partial colectomy new colostomy 11/08/17 Dr. Johney Maine - POD#16 - PO pain control - scheduled tylenoland robaxin,home gabapentin/xanax,po oxycodone PRN.  -VAC removed; daily wet-to-dry dressing changes to midline wound - having bowel function,G-tube clamped -Soft diet - pepcid for reflux - mobilize/IS   Leukocytosis -15.5 from 16. likely SBP, paracentesis +ENTEROCOCCUS FAECIUM. Per medicine, on zosyn.  Malnutirion - TNA d/c-ed 6/12; PO intake improved significantly s/p paracentesis. Follow PO intake closely.   FEN: SOFT DIET +Pro-Stat BID + Orgain supplements;TNA D/C-ed 6/12, CCS discussed with Dr. [name] - continue PICC line at discharge for possible resumption of TNA and blood draws; PICC placed 5/25.   VTE: SCD's, Lovenox ID: perioperative cefotetan;Zosyn 6/9>> empirically for possible PNA and SBP Foley: removed 5/31 (POD#2) Follow up: Dr. Johney Maine, Dr. [name]    LOS: 21 days    Jill Alexanders , [facility] 11/24/2017, 8:30 AM Pager: [phone] Consults: [phone] Mon-Fri 7:00 am-4:30 pm Sat-Sun 7:00 am-11:30 am

## 2017-11-24 NOTE — Progress Notes (Signed)
Patient is stable for discharge. Discharge instructions and medications have been reviewed with the patient and all questions answered. AVS given to patient. Prescriptions sent electronically. Patient given supplies for dressing changes and colostomy supplies. [facility] to follow up with patient.   Othella Boyer [facility] 11/24/2017 3:54 PM

## 2017-11-24 NOTE — Discharge Summary (Signed)
Discharge Summary  Sonia Hill ULA:[id number] DOB: 01/06/1956  PCP: [name], MD  Admit date: 11/03/2017 Discharge date: 11/24/2017  Time spent: 45 mins  Recommendations for Outpatient Follow-up:  1. PCP 2. General surgery 3. Oncology  Discharge Diagnoses:  Active Hospital Problems   Diagnosis Date Noted  . Small bowel obstruction from metastasis s/p SB resection 11/08/2017 11/03/2017  . Anemia 11/12/2017  . Malnutrition of moderate degree 11/11/2017  . Metastatic breast cancer to small intestine causing SBO s/p SB resection 11/08/2017 11/10/2017  . 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 Cachexia ([place]) 11/10/2017  . New onset left bundle branch block (LBBB) 11/09/2017  . Colostomy obstruction from metastasis s/p colectomy/ostomy revision 11/08/2017 11/08/2017  . 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 Ascites 11/08/2017  . Hypoglycemia 11/04/2017  . Hypokalemia 11/04/2017  . Hypocalcemia 11/04/2017  . 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 Hydronephrosis 10/24/2017  . 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 Hyponatremia 10/16/2017  . Protein-calorie malnutrition, severe 07/11/2017  . Breast cancer metastasized to multiple sites [facility]) 10/16/2015    Resolved Hospital Problems   Diagnosis Date Noted Date Resolved  . Malnutrition of moderate degree 11/09/2017 11/11/2017    Discharge Condition: Stable   Diet recommendation: Heart healthy  Vitals:   11/23/17 2156 11/24/17 0418  BP: 122/74 122/71  Pulse: (!) 109 (!) 104  Resp: 18 20  Temp: 97.8 F (36.6 C) 98 F (36.7 C)  SpO2: 100% 98%    History of present illness:  62 yr old female with metastatic breast cancer who sees Dr. [name] - on Verzenio, Hx of Colostomy + rectal mass 04/2017-complicated by SBO-recurrent SBO 06/2017 and currently on liquid diet at home, was admitted with severe excruciating abdominal pain no ostomy output. CT Abdomen showed  worsening cancer. Underwent Surgery on 5/29 and had Diagnostic Laparoscopy, Lysis of Adhesions, Small Bowel Resection, Insertion of Gastrotomy Tube and Partial Colectomy with new Colostomy. CT of Abdomen and CXR showed possible PNA so patient was started on Empiric Zosyn. Patient underwent Thoracentesis x 2 (Left and Right) and Paracentesis which found Enterococcus SBP. Pt noted to be grossly volume overloaded but is improving slowly with diuresis. Currently tolerating orally.  Today, pt denies any new complaints, stable for discharge with close follow up with PCP, oncology, and general surgery.   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 Johney Maine 11/08/2017.  NG tube and TPN hasbeen discontinued per General Surgery.  Tolerating Soft Diet Gen surg on board, follow up as an outpt  Hyponatremia Resolved Patient noted to have lower extremity edema as  well as low albumin levels.  TSH within normal limits.  Cortisol level  within normal limits.  Nephrology consulted to assessed the patient and feel hyponatremia may be secondary to hypervolemia with anasarca.  S/P IV albumin and low-dose salt tablets Continue compression stockings Continue PO lasix 40 mg BID PCP to follow up closely  Hypokalemia/hypocalcemia/hypomagnesemia Replaced  Hypothyroidism  TSH 3.123 C/w home thyroid  Metastatic estrogen positive lobular breast cancer Patient with metastatic spread to the spine and rectal area resulting in blockage and diverging colostomy.  Oncology following- Dr. [name] recommending resuming Exemestane at this point and continue to hold Abemaciclib   Solitary kidney with Hydronephrosis  Renal function stable Urology consulted and no further work-up needed during this hospitalization. Outpatient follow-up with Urology.   Anxiety Ativan as needed.  Severe protein calorie malnutrition s/p TPN per General Surgery  Continue Ensure Enlive 237 mL's p.o. 3 times daily with meals  Volume Overload/Anasarca Patient with lower extremity edema, abdominal swelling and anasarca  Likely component of hypoalbuminemia. PO lasix   New left bundle branch block Noted on EKG the night of 11/08/2017.  Patient denies any chest pain no shortness of breath.  2D echo with a EF of 65 to 70%, no wall motion abnormalities, left ventricular diastolic function normal.  Patient has been seen in consultation by Cardiology and no further ischemic work-up recommended at this time.  Normocytic Anemia/Acute blood loss anemia s/p surgery/iron def Patientis status post 2 units packed red blood cells 11/12/2017.  Patient denies any overt bleeding.  Colostomy with small brown stool. FOBT was positive Anemia panel has been ordered and looks like patient does have a severe iron deficiency anemia with iron level less than 5.  S/P 2 doses of IV Feraheme LDH, haptoglobin, hepatic panel not consistent with hemolysis   PCP/Oncology to follow up  Leukocytosis likely in the setting of PNA and SBP, given Enterococcus in Fluid Cx/reactive  Improving, no fever noted Urinalysis Rechecked along with Urine Culture and Urinalysis Negative  Last Urine Cx on 6/1 showed 30,000 CFU of Pseudomonas Aeruginosa; Repeat Cx showed Multiple Species Present  Repeat Blood Cultures showed NGTD at 4 days Paracentesis done and showed Cloudy Appearance with 2837 WBC's and 82 Neutrophils with other Cx studies showing ENTEROCOCCUS FAECIUM with Susceptibilities to Follow  Left and Right Thoracentesis done  S/P 7 days of IV Zosyn, discharge on 3 more days of Augmentin for a total of 10 days  Abnormal LFT's/Elevated Alk Phos,improving  AST was 108, ALT was 108, and Alk Phos was 471; Now improved and AST is 39, ALT is 31 and Alk Phos is 293 Likely from TNA/TPN  Thrombocytosis Worsening Spoke to Oncologist Dr Marin Olp, likely reactive from severe iron def anemia, malignancy Given 2 doses of IV feraheme Platelet Count went from 401 -> 549 -> 636 -> 800-->989-->1000 Follow up with oncologist with repeat labs     Procedures: S/P diagnostic laparoscopy, LOA, SBR, insertion gastrostomy tube, partial colectomy new colostomy 11/08/17 Dr. Johney Maine  Throacentesis Right - Drained 230 mL Thoracentesis Left - Drained 130 mL  Paracentesis - Removed 570 mL   Consultations:  General Surgery: [name] 11/04/2017  Palliative care: Dr. Rowe Pavy 10/30/2017  Cardiology: Dr.Skains 11/10/2017  Nephrology: Dr: Jonnie Finner 11/11/2017  Gastroenterology: Dr. Cristina Gong 11/12/2017  Urology: Dr. Alinda Money 11/13/2017  Pharmacy for TNA      Discharge Exam: BP 122/71 (BP Location: Right Leg)   Pulse (!) 104   Temp 98 F (36.7 C) (Oral)   Resp  20   Ht 5' (1.524 m)   Wt 52.1 kg (114 lb 12.8 oz)   SpO2 98%   BMI 22.42 kg/m   General: NAD Cardiovascular: S1, S2 present Respiratory: Diminished BS bilaterally   Discharge Instructions You were  cared for by a hospitalist during your hospital stay. If you have any questions about your discharge medications or the care you received while you were in the hospital after you are discharged, you can call the unit and asked to speak with the hospitalist on call if the hospitalist that took care of you is not available. Once you are discharged, your primary care physician will handle any further medical issues. Please note that NO REFILLS for any discharge medications will be authorized once you are discharged, as it is imperative that you return to your primary care physician (or establish a relationship with a primary care physician if you do not have one) for your aftercare needs so that they can reassess your need for medications and monitor your lab values.   Allergies as of 11/24/2017      Reactions   Sulfa Antibiotics Rash   Adhesive [tape] Rash   Ok to use paper tape and tegaderm over IV site   Betadine [povidone Iodine] Rash   Mercury Rash   Reaction mercurachrome      Medication List    STOP taking these medications   abemaciclib 150 MG tablet Commonly known as:  VERZENIO   promethazine 25 MG suppository Commonly known as:  PHENERGAN     TAKE these medications   ALPRAZolam 0.25 MG tablet Commonly known as:  XANAX TAKE 1 TABLET BY MOUTH EVERY 8 HOURS AS NEEDED FOR ANXIETY OR SLEEP   amoxicillin-clavulanate 875-125 MG tablet Commonly known as:  AUGMENTIN Take 1 tablet by mouth every 12 (twelve) hours for 5 doses.   cholecalciferol 400 UNIT/ML Liqd Commonly known as:  D-VI-SOL Take 400 Units by mouth daily.   exemestane 25 MG tablet Commonly known as:  AROMASIN Take 1 tablet (25 mg total) by mouth daily after breakfast.   furosemide 40 MG tablet Commonly known as:  LASIX Take 1 tablet (40 mg total) by mouth 2 (two) times daily.   gabapentin 300 MG capsule Commonly known as:  NEURONTIN Take 1 capsule (300 mg total) by mouth 3 (three) times daily as needed. What  changed:  reasons to take this   heparin lock flush 100 UNIT/ML Soln injection Inject 2.5 mLs (250 Units total) into the vein daily for 30 doses.   NATURE-THROID 81.25 MG Tabs Generic drug:  Thyroid Take 81.25 mg by mouth daily.   ondansetron 8 MG disintegrating tablet Commonly known as:  ZOFRAN-ODT Take 1 tablet (8 mg total) by mouth every 8 (eight) hours as needed for nausea or vomiting.   oxyCODONE 5 MG immediate release tablet Commonly known as:  Oxy IR/ROXICODONE Take 1 tablet (5 mg total) by mouth every 6 (six) hours as needed for up to 7 days for moderate pain or severe pain.   polyethylene glycol packet Commonly known as:  MIRALAX / GLYCOLAX Take 17 g by mouth daily.   simethicone 125 MG chewable tablet Commonly known as:  MYLICON Chew 226 mg by mouth every 2 (two) hours as needed for flatulence.            Durable Medical Equipment  (From admission, onward)        Start     Ordered   11/20/17 1049  For home use  only DME Bedside commode  Once    Question:  Patient needs a bedside commode to treat with the following condition  Answer:  Fear for personal safety   11/20/17 1049   11/20/17 1001  For home use only DME Hospital bed  Once    Question Answer Comment  The above medical condition requires: Patient requires the ability to reposition frequently   Head must be elevated greater than: 45 degrees   Bed type Semi-electric      11/20/17 1002     Allergies  Allergen Reactions  . Sulfa Antibiotics Rash  . Adhesive [Tape] Rash    Ok to use paper tape and tegaderm over IV site  . Betadine [Povidone Iodine] Rash  . Mercury Rash    Reaction mercurachrome   Follow-up Information    Michael Boston, MD. Schedule an appointment as soon as possible for a visit in 2 week(s).   Specialty:  General Surgery Why:  for post-operative follow up. Contact information: [address] [place] [address] [phone]        [name], MD.  Schedule an appointment as soon as possible for a visit in 1 week(s).   Specialty:  Family Medicine Contact information: [address] [place] [address] [phone]        Magrinat, Virgie Dad, MD. Schedule an appointment as soon as possible for a visit in 1 week(s).   Specialty:  Oncology Why:  Have labs drawn in 1 week to follow up on white blood cell count and platelet count Contact information: [place] [address] [phone]            The results of significant diagnostics from this hospitalization (including imaging, microbiology, ancillary and laboratory) are listed below for reference.    Significant Diagnostic Studies: Ct Abdomen Pelvis Wo Contrast  Result Date: 11/03/2017 CLINICAL DATA:  Abdominal distension with decreased ostomy output EXAM: CT ABDOMEN AND PELVIS WITHOUT CONTRAST TECHNIQUE: Multidetector CT imaging of the abdomen and pelvis was performed following the standard protocol without IV contrast. COMPARISON:  CT 07/10/2017, 05/22/2017, radiograph 11/02/2017 FINDINGS: Lower chest: Lung bases demonstrate small bilateral pleural effusions. No consolidation. Normal heart size. Hepatobiliary: No focal liver abnormality is seen. No gallstones, gallbladder wall thickening, or biliary dilatation. Pancreas: Unremarkable. No pancreatic ductal dilatation or surrounding inflammatory changes. Spleen: Normal in size without focal abnormality. Adrenals/Urinary Tract: Right adrenal gland is normal. Mild to moderate right hydronephrosis and hydroureter as before. Absent left kidney. Bladder within normal limits. Stomach/Bowel: Mild-to-moderate fluid distention of stomach. Multiple dilated loops of proximal to mid small bowel, measuring up to 4.5 cm. Abduct transition to decompressed distal small bowel in the right mid to lower abdomen, series 2, image number 43, with minimal contrast within decompressed small bowel loops distal to this. No  appreciable contrast in the colon. Status post left lower quadrant double-barrel colostomy. Heterogeneous soft tissue thickening at the anus and rectum, corresponding to previously noted mass. Vascular/Lymphatic: Nonaneurysmal aorta. Limited evaluation for adenopathy due to diffuse abdominal fluid. Reproductive: No adnexal mass. Uterus inseparable from soft tissue mass at the rectum and anus. Other: No free air. Increased abdominal and pelvic ascites, now moderate. Diffuse subcutaneous edema. Musculoskeletal: No acute or suspicious abnormality. IMPRESSION: 1. Multiple dilated loops of small bowel with abduct transition to decompressed distal small bowel in the mid to lower abdomen slightly to the right of midline, consistent with small bowel obstruction. A small amount of contrast does pass  into decompressed small bowel distal to the point of obstruction. 2. Interim development of small pleural effusions. Development of moderate abdominopelvic ascites. Diffuse subcutaneous edema consistent with anasarca 3. Status post left lower quadrant colostomy. Soft tissue enlargement of the anus and rectum, corresponding to previously demonstrated infiltrative mass in the region. 4. Similar mild to moderate right hydronephrosis and slightly dilated right ureter. Electronically Signed   By: Donavan Foil M.D.   On: 11/03/2017 21:41   Dg Chest 1 View  Result Date: 11/20/2017 CLINICAL DATA:  Right thoracentesis. EXAM: CHEST  1 VIEW COMPARISON:  11/16/2017, 11/20/2017 FINDINGS: Small left pleural effusion. No significant right pleural effusion. No pneumothorax. No focal consolidation. Stable cardiomediastinal silhouette. Right-sided PICC line with the tip projecting over the SVC. No acute osseous abnormality. IMPRESSION: 1. No right pneumothorax status post thoracentesis. Electronically Signed   By: Kathreen Devoid   On: 11/20/2017 11:54   Dg Chest 1 View  Result Date: 11/18/2017 CLINICAL DATA:  Right thoracentesis EXAM: CHEST   1 VIEW COMPARISON:  11/16/2017 chest radiograph. FINDINGS: Right PICC terminates in lower third of the SVC. Surgical clips overlie the left lower chest. Stable cardiomediastinal silhouette with normal heart size. No pneumothorax. No significant residual right pleural effusion. Stable small left pleural effusion. No pulmonary edema. Persistent patchy left basilar lung opacity, unchanged. IMPRESSION: 1. No right pneumothorax. No significant residual right pleural effusion. 2. Stable small left pleural effusion and patchy left lung base opacity. Electronically Signed   By: Ilona Sorrel M.D.   On: 11/18/2017 16:38   Dg Abd 1 View  Result Date: 11/03/2017 CLINICAL DATA:  Small-bowel obstruction EXAM: ABDOMEN - 1 VIEW COMPARISON:  07/14/2017 FINDINGS: Moderate small bowel dilatation. Colon decompressed. Left lower quadrant ostomy. No renal calculi. Normal skeletal structures. IMPRESSION: Small-bowel obstruction. Electronically Signed   By: Franchot Gallo M.D.   On: 11/03/2017 08:44   Ct Abdomen Pelvis W Contrast  Result Date: 11/17/2017 CLINICAL DATA:  Leukocytosis. Recent partial colectomy, small-bowel resection and colostomy. EXAM: CT ABDOMEN AND PELVIS WITH CONTRAST TECHNIQUE: Multidetector CT imaging of the abdomen and pelvis was performed using the standard protocol following bolus administration of intravenous contrast. CONTRAST:  121m ISOVUE-300 IOPAMIDOL (ISOVUE-300) INJECTION 61% COMPARISON:  Preoperative noncontrast abdominal CT 11/03/2017 FINDINGS: Lower chest: Moderate bilateral pleural effusions and adjacent airspace disease, on the right this represents compressive atelectasis, on the left may be atelectasis or pneumonia. The heart is normal in size. Prior left mastectomy. Hepatobiliary: No focal hepatic lesion. Gallbladder partially distended, no calcified gallstone. No biliary dilatation. Pancreas: No ductal dilatation or inflammation. Spleen: Normal in size without focal abnormality.  Adrenals/Urinary Tract: No adrenal nodule. Prior left nephrectomy. Moderate right hydronephrosis and proximal hydroureter, not significantly changed from prior exams. More distal ureter is nondistended. Urinary bladder is partially distended. Posterior bladder wall thickening more prominent on the left. Stomach/Bowel: Gastrostomy tube in the stomach. Prior small bowel dilatation has resolved. Enteric sutures noted in small bowel in the lower pelvis. No bowel obstruction with enteric contrast reaching the transverse colon and left mid abdominal colostomy. Stabled off transverse and descending colon are decompressed. Irregular enhancing rectal mass extending into the perirectal soft tissues and uterus, direct comparison difficult given lack of enhancement on prior exam. No peristomal hernia. Vascular/Lymphatic: Abdominal aorta is normal in caliber. Portal, splenic, and mesenteric veins are patent. No acute vascular findings. No definite enlarged abdominal or pelvic lymph nodes. Small retroperitoneal nodes are similar to prior exam. Reproductive: Rectal mass involves the uterus which  is heterogeneous, enlarged, with ill-defined borders inferiorly. Other: Moderate volume intra-abdominal ascites which measures simple fluid density, however slight peritoneal enhancement is noted. Loculated pelvic fluid appears similar to prior exams. Generalized body wall edema and skin thickening. No free air in the abdomen. Midline skin staples noted. Musculoskeletal: Possible vague lucent lesion within L1 and L3 vertebral bodies, not definitively seen on prior exams. No sclerotic lesions. IMPRESSION: 1. Recent small bowel resection and transverse colostomy. Moderate volume of intra-abdominal ascites with slight peritoneal enhancement, but no well-defined abscess. No free air. 2. Moderate bilateral pleural effusions and bibasilar airspace disease. On the right this is consistent with compressive atelectasis, on the left may be  atelectasis or pneumonia in the setting of leukocytosis. Whole body wall edema and anasarca. 3. Enhancing rectal mass invading the uterus and possibly posterior wall of the bladder. 4. Stable right hydronephrosis and proximal hydroureter. 5. Possible lucent lesions within L1 and L3 vertebral bodies out or not seen on 07/10/2017 CT, and may represent osseous metastatic disease. This could be further assessed with nonemergent bone scan or PET-CT. Electronically Signed   By: Jeb Levering M.D.   On: 11/17/2017 00:19   US Paracentesis  Result Date: 11/17/2017 INDICATION: Patient with history of stage IV lobular breast cancer, recent small bowel resection/transverse colostomy, ascites, bilateral pleural effusions, rectal mass. Request made for diagnostic and therapeutic paracentesis. EXAM: ULTRASOUND GUIDED DIAGNOSTIC AND THERAPEUTIC PARACENTESIS MEDICATIONS: None COMPLICATIONS: None immediate. PROCEDURE: Informed written consent was obtained from the patient after a discussion of the risks, benefits and alternatives to treatment. A timeout was performed prior to the initiation of the procedure. Initial ultrasound scanning demonstrates a small to moderate amount of loculated ascites within the right lower abdominal quadrant. The right lower abdomen was prepped and draped in the usual sterile fashion. 1% lidocaine was used for local anesthesia. Following this, a 6 Fr Safe-T-Centesis catheter was introduced. An ultrasound image was saved for documentation purposes. The paracentesis was performed. The catheter was removed and a dressing was applied. The patient tolerated the procedure well without immediate post procedural complication. FINDINGS: A total of approximately 570 cc of hazy, amber fluid was removed. Samples were sent to the laboratory as requested by the clinical team. Due to the multiloculated nature of the ascites only the above amount of fluid could be removed today. IMPRESSION: Successful  ultrasound-guided diagnostic and therapeutic paracentesis yielding 570 cc of peritoneal fluid. Read by: Rowe Robert, PA-C Electronically Signed   By: Sandi Mariscal M.D.   On: 11/17/2017 16:31   Dg Chest Port 1 View  Result Date: 11/20/2017 CLINICAL DATA:  Shortness of breath. EXAM: PORTABLE CHEST 1 VIEW COMPARISON:  Single-view of the chest 11/19/2017, 11/18/2017 and 11/11/2017. FINDINGS: Left greater than right pleural effusions and basilar atelectasis appear unchanged since the most recent examination. No pneumothorax. Heart size is normal. Right PICC remains in place. Surgical clips left axilla noted. IMPRESSION: No change in left greater than right pleural effusions and basilar atelectasis since the most recent study. Electronically Signed   By: Inge Rise M.D.   On: 11/20/2017 08:46   Dg Chest Port 1 View  Result Date: 11/19/2017 CLINICAL DATA:  Dyspnea EXAM: PORTABLE CHEST 1 VIEW COMPARISON:  Chest radiograph from one day prior. FINDINGS: Right PICC terminates in lower third of the SVC. Surgical clips overlie the left chest wall. Stable cardiomediastinal silhouette with normal heart size. No pneumothorax. Small bilateral pleural effusions are probably stable accounting for differences in positioning. No pulmonary edema.  Left basilar atelectasis is stable. IMPRESSION: 1. Small bilateral pleural effusions, probably stable accounting for differences in positioning. 2. Left basilar atelectasis, stable. Electronically Signed   By: Ilona Sorrel M.D.   On: 11/19/2017 08:00   Dg Chest Port 1 View  Result Date: 11/16/2017 CLINICAL DATA:  Leukocytosis. EXAM: PORTABLE CHEST 1 VIEW COMPARISON:  Chest radiograph 11/11/2004, lung bases from abdominal CT 11/03/2017 FINDINGS: Left pleural effusion and basilar opacity, unchanged from prior exam. Small right pleural effusion has improved from prior exam. Mild residual right basilar atelectasis. Heart is normal in size. No new airspace disease. Tip of the right  upper extremity PICC in the SVC. Left mastectomy with surgical clips in the chest wall. IMPRESSION: Left pleural effusion and basilar opacity are unchanged from chest radiograph 5 days ago. Basilar opacity likely secondary to atelectasis, sterility technically indeterminate by radiograph. Improved right pleural effusion and atelectasis from prior exam. No new airspace disease. Electronically Signed   By: Jeb Levering M.D.   On: 11/16/2017 20:42   Dg Chest Port 1 View  Result Date: 11/11/2017 CLINICAL DATA:  Acute kidney injury.  Chest pain. EXAM: PORTABLE CHEST 1 VIEW COMPARISON:  10/31/2016 FINDINGS: Right arm PICC tip in the SVC 4 cm above the right atrium. Bilateral pleural effusions with dependent atelectasis. Upper lungs are clear. Previous mastectomy on the left. No acute bone finding. IMPRESSION: Bilateral effusions with dependent pulmonary atelectasis. Electronically Signed   By: Nelson Chimes M.D.   On: 11/11/2017 15:17   Dg Abd 2 Views  Result Date: 11/07/2017 CLINICAL DATA:  62 year old female with small bowel obstruction. Recent colonic surgery. Subsequent encounter. EXAM: ABDOMEN - 2 VIEW COMPARISON:  11/05/2017 plain film examination.  11/03/2017 CT. FINDINGS: Gas and fluid distended dilated small bowel loops more prominent than on the recent plain film examination with small bowel measuring up to 4.7 cm versus prior 4.5 cm. Small bowel fold thickening. Air-fluid levels. Interval development of gas filled stomach. Minimal residual contrast within colon. Colostomy left lower quadrant. No free intraperitoneal air noted. IMPRESSION: Progressive partial small bowel obstructive pattern. Interval development of gas-filled stomach. No free intraperitoneal air. These results will be called to the ordering clinician or representative by the Radiologist Assistant, and communication documented in the PACS or zVision Dashboard. Electronically Signed   By: Genia Del M.D.   On: 11/07/2017 09:05   Dg  Abd 2 Views  Result Date: 11/05/2017 CLINICAL DATA:  Per order- small bowel obstruction PT HX: GERD, bladder sugery EXAM: ABDOMEN - 2 VIEW COMPARISON:  CT 11/03/2017 and previous FINDINGS: Visualized lung bases clear.  No free air. Multiple dilated small bowel loops with fluid levels on the erect radiograph. Similar number of involved loops and degree of dilatation since previous radiograph 11/02/2017. There has been progression of oral contrast material into the proximal colon which is nondilated. There is a left lower quadrant ostomy device. No abnormal abdominal calcifications.  Regional bones unremarkable. IMPRESSION: 1. Findings consistent with persistent partial mid/distal small bowel obstruction. 2. No free air. Electronically Signed   By: Lucrezia Europe M.D.   On: 11/05/2017 12:23   [place] Ekg Site Rite  Result Date: 11/04/2017 If Site Rite image not attached, placement could not be confirmed due to current cardiac rhythm.  US Thoracentesis Asp Pleural Space W/img Guide  Result Date: 11/20/2017 INDICATION: Patient with history of stage IV lobular breast cancer, recent small bowel resection/transverse colostomy, ascites, bilateral pleural effusions, rectal mass. Request made for diagnostic and therapeutic left  thoracentesis. EXAM: ULTRASOUND GUIDED DIAGNOSTIC AND THERAPEUTIC LEFT THORACENTESIS MEDICATIONS: None COMPLICATIONS: None immediate. PROCEDURE: An ultrasound guided thoracentesis was thoroughly discussed with the patient and questions answered. The benefits, risks, alternatives and complications were also discussed. The patient understands and wishes to proceed with the procedure. Written consent was obtained. Ultrasound was performed to localize and mark an adequate pocket of fluid in the left chest. The area was then prepped and draped in the normal sterile fashion. 1% Lidocaine was used for local anesthesia. Under ultrasound guidance a 6 Fr Safe-T-Centesis catheter was introduced. Thoracentesis  was performed. The catheter was removed and a dressing applied. FINDINGS: A total of approximately 170 cc of yellow fluid was removed. Samples were sent to the laboratory as requested by the clinical team. IMPRESSION: Successful ultrasound guided diagnostic and therapeutic left thoracentesis yielding 170 cc of pleural fluid. Read by: Rowe Robert, PA-C Electronically Signed   By: Markus Daft M.D.   On: 11/20/2017 11:28   US Thoracentesis Asp Pleural Space W/img Guide  Result Date: 11/18/2017 INDICATION: Pleural effusions, metastatic breast cancer EXAM: ULTRASOUND GUIDED RIGHT THORACENTESIS MEDICATIONS: 1% lidocaine local COMPLICATIONS: None immediate. PROCEDURE: An ultrasound guided thoracentesis was thoroughly discussed with the patient and questions answered. The benefits, risks, alternatives and complications were also discussed. The patient understands and wishes to proceed with the procedure. Written consent was obtained. Ultrasound was performed to localize and mark an adequate pocket of fluid in the right chest. The area was then prepped and draped in the normal sterile fashion. 1% Lidocaine was used for local anesthesia. Under ultrasound guidance a 6 Fr Safe-T-Centesis catheter was introduced. Thoracentesis was performed. The catheter was removed and a dressing applied. FINDINGS: A total of approximately 230 cc of serosanguineous pleural fluid was removed. Samples were sent to the laboratory as requested by the clinical team. IMPRESSION: Successful ultrasound guided right thoracentesis yielding 230 cc of pleural fluid. Electronically Signed   By: Jerilynn Mages.  Shick M.D.   On: 11/18/2017 13:44    Microbiology: Recent Results (from the past 240 hour(s))  Culture, Urine     Status: Abnormal   Collection Time: 11/16/17 10:55 AM  Result Value Ref Range Status   Specimen Description   Final    URINE, CLEAN CATCH Performed at [facility], [place] [address], [place], [place] [address]     Special Requests   Final    NONE Performed at [facility], [place] [address] [place], [place] [address]    Culture MULTIPLE SPECIES PRESENT, SUGGEST RECOLLECTION (A)  Final   Report Status 11/17/2017 FINAL  Final  Culture, blood (routine x 2)     Status: None   Collection Time: 11/16/17  9:02 PM  Result Value Ref Range Status   Specimen Description   Final    BLOOD RIGHT HAND Performed at [place] [address], [place], [place] [address]    Special Requests   Final    BOTTLES DRAWN AEROBIC AND ANAEROBIC Blood Culture adequate volume Performed at [place] [address], [place], [place] [address]    Culture   Final    NO GROWTH 5 DAYS Performed at [facility], [place] [address] [place], [place] [address]    Report Status 11/22/2017 FINAL  Final  Culture, blood (routine x 2)     Status: None   Collection Time: 11/17/17 12:18 AM  Result Value Ref Range Status   Specimen Description   Final  BLOOD RIGHT HAND Performed at [facility], [place] [address], [place], [place] [address]    Special Requests   Final    BOTTLES DRAWN AEROBIC AND ANAEROBIC Blood Culture adequate volume Performed at [place] [address], [place], [place] [address]    Culture   Final    NO GROWTH 5 DAYS Performed at [facility], [place] [address] [place], [place] [address]    Report Status 11/22/2017 FINAL  Final  Fungus Culture With Stain     Status: None (Preliminary result)   Collection Time: 11/17/17  5:13 PM  Result Value Ref Range Status   Fungus Stain Final report  Final    Comment: (NOTE) Performed At: [facility] [place], [place] [id number] Rush Farmer MD [id number]    Fungus (Mycology) Culture PENDING  Incomplete   Fungal Source PERITONEAL  Final    Comment: Performed at [facility], [place] [address], [place], [place]  [address]  Culture, body fluid-bottle     Status: Abnormal   Collection Time: 11/17/17  5:13 PM  Result Value Ref Range Status   Specimen Description PERITONEAL  Final   Special Requests BOTTLES DRAWN AEROBIC AND ANAEROBIC 10CC  Final   Gram Stain   Final    GRAM POSITIVE COCCI IN CHAINS IN BOTH AEROBIC AND ANAEROBIC BOTTLES CRITICAL RESULT CALLED TO, READ BACK BY AND VERIFIED WITH: EDWARDS RN AT [id number] ON [id number] BY SJW Performed at [facility], [place] [address] [place], [place] [address]    Culture ENTEROCOCCUS FAECIUM (A)  Final   Report Status 11/22/2017 FINAL  Final   Organism ID, Bacteria ENTEROCOCCUS FAECIUM  Final      Susceptibility   Enterococcus faecium - MIC*    AMPICILLIN <=2 SENSITIVE Sensitive     VANCOMYCIN <=0.5 SENSITIVE Sensitive     GENTAMICIN SYNERGY SENSITIVE Sensitive     * ENTEROCOCCUS FAECIUM  Gram stain     Status: None   Collection Time: 11/17/17  5:13 PM  Result Value Ref Range Status   Specimen Description PERITONEAL  Final   Special Requests NONE  Final   Gram Stain   Final    FEW WBC PRESENT, PREDOMINANTLY PMN NO ORGANISMS SEEN Performed at [facility], [place] [address] [place], [place] [address]    Report Status 11/17/2017 FINAL  Final  Fungus Culture Result     Status: None   Collection Time: 11/17/17  5:13 PM  Result Value Ref Range Status   Result 1 Comment  Final    Comment: (NOTE) KOH/Calcofluor preparation:  no fungus observed. Performed At: [facility] [place], [place] [id number] Rush Farmer MD [id number] Performed at [facility], [place] [address] [place], [place] [address]   Fungus Culture With Stain     Status: None (Preliminary result)   Collection Time: 11/18/17  1:54 PM  Result Value Ref Range Status   Fungus Stain Final report  Final    Comment: (NOTE) Performed At: [facility] [place], [place] [id number] Rush Farmer MD  [id number] Performed at [facility], [place] [address] [place],  [address]    Fungus (Mycology) Culture PENDING  Incomplete  Culture, body fluid-bottle     Status: None   Collection Time: 11/18/17  1:54 PM  Result Value Ref Range Status   Specimen Description FLUID RIGHT PLEURAL  Final   Special Requests  BOTTLES DRAWN AEROBIC AND ANAEROBIC  Final   Culture   Final    NO GROWTH 5 DAYS Performed at [facility], [place] [address], [place], [place] [address]    Report Status 11/24/2017 FINAL  Final  Gram stain     Status: None   Collection Time: 11/18/17  1:54 PM  Result Value Ref Range Status   Specimen Description FLUID RIGHT PLEURAL  Final   Special Requests NONE  Final   Gram Stain   Final    RARE WBC PRESENT, PREDOMINANTLY MONONUCLEAR NO ORGANISMS SEEN Performed at [facility], [place] [address] [place], [place] [address]    Report Status 11/19/2017 FINAL  Final  Fungus Culture Result     Status: None   Collection Time: 11/18/17  1:54 PM  Result Value Ref Range Status   Result 1 Comment  Final    Comment: (NOTE) KOH/Calcofluor preparation:  no fungus observed. Performed At: [facility] [place], [place] [id number] Rush Farmer MD [id number] Performed at [facility], [place] [address] [place], [place] [address]   Culture, body fluid-bottle     Status: None (Preliminary result)   Collection Time: 11/20/17 11:27 AM  Result Value Ref Range Status   Specimen Description FLUID  Final   Special Requests NONE  Final   Culture   Final    NO GROWTH 4 DAYS Performed at [facility], [address], [place],  [address]    Report Status PENDING  Incomplete     Labs: Basic Metabolic Panel: Recent Labs  Lab 11/18/17 0413 11/19/17 0519 11/20/17 0444 11/21/17 0432 11/22/17 0818 11/23/17 0851 11/24/17 0530  NA 129* 131* 132* 133* 133* 134* 137  K 3.2* 3.6 3.2* 3.6 3.6 3.0* 2.9*   CL 90* 91* 92* 92* 93* 93* 96*  CO2 '28 28 29 29 29 27 27  ' GLUCOSE 106* 119* 103* 106* 97 117* 95  BUN 29* 34* 39* 42* 46* 42* 37*  CREATININE 0.67 0.92 0.89 0.90 0.91 1.09* 1.08*  CALCIUM 8.6* 9.0 9.1 9.2 9.1 9.4 9.5  MG 2.1 2.2 2.1 2.0 2.1  --   --   PHOS 3.7 3.9 4.1 3.8 4.6  --   --    Liver Function Tests: Recent Labs  Lab 11/18/17 0413 11/19/17 0519 11/20/17 0444 11/21/17 0432 11/22/17 0818  AST 31 30 35 39 39  ALT 36 '28 28 31 ' 34  ALKPHOS 323* 262* 303* 293* 247*  BILITOT 1.3* 1.3* 1.0 1.0 0.7  PROT 5.7* 6.1* 5.9* 6.1* 6.2*  ALBUMIN 3.0* 3.4* 3.3* 3.0* 2.9*   No results for input(s): LIPASE, AMYLASE in the last 168 hours. No results for input(s): AMMONIA in the last 168 hours. CBC: Recent Labs  Lab 11/21/17 0432 11/22/17 0500 11/22/17 0818 11/23/17 0851 11/24/17 0530  WBC 16.4* SPECIMEN CONTAMINATED, UNABLE TO PERFORM TEST(S). 16.1* 16.0* 15.5*  NEUTROABS 13.9* SPECIMEN CONTAMINATED, UNABLE TO PERFORM TEST(S). 13.4* 13.4* 12.5*  HGB 8.6* SPECIMEN CONTAMINATED, UNABLE TO PERFORM TEST(S). 9.1* 8.9* 8.9*  HCT 26.1* SPECIMEN CONTAMINATED, UNABLE TO PERFORM TEST(S). 26.3* 26.9* 27.0*  MCV 85.9 SPECIMEN CONTAMINATED, UNABLE TO PERFORM TEST(S). 85.7 86.8 87.4  PLT 800* SPECIMEN CONTAMINATED, UNABLE TO PERFORM TEST(S). 886* 989* 1,022*   Cardiac Enzymes: No results for input(s): CKTOTAL, CKMB, CKMBINDEX, TROPONINI in the last 168 hours. BNP: BNP (last 3 results) No results for input(s): BNP in the last 8760 hours.  ProBNP (last 3 results) No results  for input(s): PROBNP in the last 8760 hours.  CBG: No results for input(s): GLUCAP in the last 168 hours.     Signed:  Alma Friendly, MD [facility] 11/24/2017, 3:45 PM

## 2017-11-25 LAB — CULTURE, BODY FLUID W GRAM STAIN -BOTTLE: Culture: NO GROWTH

## 2017-11-25 LAB — CULTURE, BODY FLUID-BOTTLE

## 2017-11-27 ENCOUNTER — Telehealth: Payer: Self-pay

## 2017-11-27 ENCOUNTER — Telehealth: Payer: Self-pay | Admitting: Oncology

## 2017-11-27 ENCOUNTER — Other Ambulatory Visit: Payer: Self-pay | Admitting: Oncology

## 2017-11-27 NOTE — Telephone Encounter (Signed)
Message sent to scheduling to have pt come in this Wed, 6/19, per Dr [name] for labs and to see him

## 2017-11-27 NOTE — Telephone Encounter (Signed)
Called pt re appts being moved to 6/19 per 6/17 sch msg - spoke w/ pt and confirmed appt.

## 2017-11-28 ENCOUNTER — Emergency Department (HOSPITAL_BASED_OUTPATIENT_CLINIC_OR_DEPARTMENT_OTHER)
Admission: EM | Admit: 2017-11-28 | Discharge: 2017-11-28 | Disposition: A | Discharge status: Home / Self Care | Payer: 59 | Attending: Emergency Medicine | Admitting: Emergency Medicine

## 2017-11-28 ENCOUNTER — Emergency Department (HOSPITAL_BASED_OUTPATIENT_CLINIC_OR_DEPARTMENT_OTHER): Payer: 59

## 2017-11-28 ENCOUNTER — Encounter (HOSPITAL_BASED_OUTPATIENT_CLINIC_OR_DEPARTMENT_OTHER): Payer: Self-pay

## 2017-11-28 ENCOUNTER — Other Ambulatory Visit: Payer: Self-pay

## 2017-11-28 DIAGNOSIS — R531 Weakness: Secondary | ICD-10-CM | POA: Diagnosis not present

## 2017-11-28 DIAGNOSIS — R030 Elevated blood-pressure reading, without diagnosis of hypertension: Secondary | ICD-10-CM | POA: Diagnosis present

## 2017-11-28 DIAGNOSIS — E039 Hypothyroidism, unspecified: Secondary | ICD-10-CM | POA: Diagnosis not present

## 2017-11-28 DIAGNOSIS — Z79899 Other long term (current) drug therapy: Secondary | ICD-10-CM | POA: Diagnosis not present

## 2017-11-28 DIAGNOSIS — N189 Chronic kidney disease, unspecified: Secondary | ICD-10-CM | POA: Diagnosis not present

## 2017-11-28 HISTORY — DX: Unspecified intestinal obstruction, unspecified as to partial versus complete obstruction: K56.609

## 2017-11-28 LAB — FUNGUS CULTURE WITH STAIN

## 2017-11-28 LAB — CBC
HCT: 26 % — ABNORMAL LOW (ref 36.0–46.0)
Hemoglobin: 8.7 g/dL — ABNORMAL LOW (ref 12.0–15.0)
MCH: 29.3 pg (ref 26.0–34.0)
MCHC: 33.5 g/dL (ref 30.0–36.0)
MCV: 87.5 fL (ref 78.0–100.0)
PLATELETS: 1074 10*3/uL — AB (ref 150–400)
RBC: 2.97 MIL/uL — ABNORMAL LOW (ref 3.87–5.11)
RDW: 14.8 % (ref 11.5–15.5)
WBC: 14.6 10*3/uL — ABNORMAL HIGH (ref 4.0–10.5)

## 2017-11-28 LAB — BASIC METABOLIC PANEL
Anion gap: 12 (ref 5–15)
BUN: 21 mg/dL — AB (ref 6–20)
CALCIUM: 9.3 mg/dL (ref 8.9–10.3)
CHLORIDE: 97 mmol/L — AB (ref 101–111)
CO2: 26 mmol/L (ref 22–32)
CREATININE: 0.67 mg/dL (ref 0.44–1.00)
GFR calc Af Amer: >60 mL/min (ref >60)
GFR calc non Af Amer: >60 mL/min (ref >60)
Glucose, Bld: 86 mg/dL (ref 65–99)
Potassium: 3.1 mmol/L — ABNORMAL LOW (ref 3.5–5.1)
Sodium: 135 mmol/L (ref 135–145)

## 2017-11-28 LAB — FUNGAL ORGANISM REFLEX

## 2017-11-28 LAB — FUNGUS CULTURE RESULT

## 2017-11-28 LAB — TROPONIN I

## 2017-11-28 LAB — PATHOLOGIST SMEAR REVIEW

## 2017-11-28 NOTE — ED Notes (Signed)
purewick cath put in place per patient request.

## 2017-11-28 NOTE — Progress Notes (Signed)
No results found for: CA125ID: Sonia Hill   DOB: [birth year]  MR#: [id number]  YBF#:[id number]  Patient Care Team: [name], MD as PCP - General (Family Medicine) Kirsty Monjaraz, Virgie Dad, MD as Consulting Physician (Oncology) Molli Posey, MD as Consulting Physician (Obstetrics and Gynecology) Laurence Spates, MD as Consulting Physician (Gastroenterology) Leighton Ruff, MD as Consulting Physician (General Surgery) Delrae Rend, MD as Consulting Physician (Endocrinology) Clarene Essex, MD as Consulting Physician (Gastroenterology) Donato Heinz, MD as Consulting Physician (Nephrology)   CHIEF COMPLAINT: Metastatic breast cancer, estrogen receptor positive  CURRENT TREATMENT: Exemestane, abemaciclib; [refuses bisphosphonates or denosumab; also refuses chemo and radiation options]  INTERVAL HISTORY: Sonia Hill returns today for follow-up of her 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 She continues on exemestane, with good tolerance. She denies having any hot flashes.   She also continues on abemaciclib, with good tolerance. She denies having issues with diarrhea or nausea.    Since her last visit, she underwent small bowel resection on 11/08/2017 for a malignant small bowel obstruction.   A CT of the abdomen and pelvis on 11/16/2017 showed: Recent small bowel resection and transverse colostomy. Moderate volume of intra-abdominal ascites with slight peritoneal enhancement, but no well-defined abscess. No free air. Moderate bilateral pleural effusions and bibasilar airspace disease. On the right this is consistent with compressive atelectasis, on the left may be atelectasis or pneumonia in the setting of leukocytosis. Whole body wall edema and anasarca. Enhancing rectal mass invading the uterus and possibly posterior wall of the bladder. Stable right hydronephrosis and proximal hydroureter. Possible lucent lesions within L1 and L3 vertebral  bodies out or not seen on 07/10/2017 CT, and may represent osseous metastatic disease. This could be further assessed with nonemergent bone scan or PET-CT.   REVIEW OF SYSTEMS: Sonia Hill reports that on 11/27/2017, the physical therapist visited her house, and she felt a little over worked after her stress test. She had high blood pressure after a doctor's visit. She measured her BP at home and it was in the 160's. She was exhausted and SOB and went to urgent care on 11/28/2017. She rested there, and her BP went down. She noted that she has been sleep deprived over the last month and she took her lasix in the morning instead of night. She is eating well with mainly solids but not raw food. She eats many small meal until she is full. She finished her antibiotics on 11/27/2017 She denies unusual headaches, visual changes, nausea, vomiting, or dizziness. There has been no unusual cough, phlegm production, or pleurisy. This been no change in bowel or bladder habits. She denies unexplained fatigue or unexplained weight loss, bleeding, rash, or fever. A detailed review of systems was otherwise stable.    BREAST CANCER HISTORY:  From the initial intake note:  Sonia Hill had screening mammography on 11/10/2009 showing very dense tissue with a possible distortion in the left breast.  Digital mammography on June 10th showed a 1.5 cm cluster of microcalcifications in the lower inner portion of the breast.  On physical exam, there was no palpable mass and no palpable axillary adenopathy.  There was some nipple inversion inferolaterally.  Ultrasound showed a 2.6 cm irregular mass-like area with dense posterior acoustic shadowing in the 3 o'clock position, 1 cm from the nipple, but normal appearing left axillary lymph nodes.    The patient was brought back for biopsy of the mass in question on June 14th and the pathology ([id number]) showed an invasive lobular  breast cancer (E-cadherin negative) apparently low grade.  A  second mass noted on ultrasound was also lobular, also low grade.  Both were ER and PR positive at well over 90%.  Both had low proliferation markers at 9%, and both were Her-2 negative by CISH with ratios of 1.6 and 1.24.  In short, both masses were nearly identical.    On June 16th, the patient had bilateral breast MRIs which showed in the left breast at 3 o'clock an irregular area of abnormal enhancement measuring up to 2.7 cm and in the left lower inner quadrant, a second post-biopsy area measuring 1.5 cm.  The difference between these two areas was stated at the conference the morning of the visit to be approximately 5 cm.  Given this data the patient opted for Left mastectomy with sentinel lymph node sampling, performed July 2011 with results as detailed below.  Subsequent history is as detailed below.    PAST MEDICAL HISTORY: Past Medical History:  Diagnosis Date  . Arthritis    neck, upper back, shoulders - no meds - yoga  . Chronic kidney disease    Only has right kidney  . Depression    Hx - no current problem  . GERD (gastroesophageal reflux disease)    diet controlled - no meds  . Hyperlipidemia    diet controlled  . Hypothyroidism   . lt breast ca dx'd 11/2009  . Osteoporosis, unspecified 12/06/2013  . PONV (postoperative nausea and vomiting)   . SBO (small bowel obstruction) ([place])   . SVD (spontaneous vaginal delivery)    x 1  1. Osteopenia. 2. Congenital left renal atrophy.  3. History of recurrent UTIs. 4. History of bladder reconstruction for reflux more than 18 years ago. 5. History of depression and anxiety. 6. History of hypothyroidism.  7. History of "sling procedure".  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    FAMILY HISTORY The patient's father is alive in his early 59s.  He has Alzheimer's disease. The patient's mother was diagnosed with breast cancer at the age of 55.  She died at the age  of 73.  The patient's father had one out of two sisters with breast cancer.  There are other breast cancers on the mother's side.  The patient has one brother and one younger sister, neither with cancer.    GYNECOLOGIC HISTORY:  (Reviewed 12/06/2013) She is GX P1.  That pregnancy was at age 1.  Menarche was at age 76.  She was perimenopausal at the time of diagnosis but is now convincingly postmenopausal  SOCIAL HISTORY:  (Reviewed 12/06/2013) She works as a Electrical engineer, part-time and on consultation.  Her husband, [name], is a Architect at [employer].  [name] works as a Pharmacist, hospital in [place].  The patient's son [name] is  in college.  The patient attends [employer].   ADVANCED DIRECTIVES: in place  HEALTH MAINTENANCE: (Updated 12/06/2013)  Social History   Tobacco Use  . Smoking status: Never Smoker  . Smokeless tobacco: Never Used  Substance Use Topics  . Alcohol use: No  . Drug use: No     Colonoscopy: 06/18/2015; [facility]  PAP: UTD/May 2015, Dr. Matthew Saras  Bone density: [name] 2014, osteoporosis  Lipid panel: UTD (Not on file)    Allergies  Allergen Reactions  . Sulfa Antibiotics Rash  . Adhesive [Tape] Rash    Ok to use paper tape and tegaderm  over IV site  . Betadine [Povidone Iodine] Rash  . Mercury Rash    Reaction mercurachrome    Current Outpatient Medications  Medication Sig Dispense Refill  . ALPRAZolam (XANAX) 0.25 MG tablet TAKE 1 TABLET BY MOUTH EVERY 8 HOURS AS NEEDED FOR ANXIETY 15 tablet 0  . cholecalciferol (D-VI-SOL) 400 UNIT/ML LIQD Take 400 Units by mouth daily.    Marland Kitchen exemestane (AROMASIN) 25 MG tablet Take 1 tablet (25 mg total) by mouth daily after breakfast. 90 tablet 4  . furosemide (LASIX) 40 MG tablet Take 1 tablet (40 mg total) by mouth 2 (two) times daily. 60 tablet 0  . gabapentin (NEURONTIN) 300 MG capsule Take 1 capsule (300 mg total) by mouth 3 (three) times daily as needed. (Patient taking differently: Take  300 mg by mouth 3 (three) times daily as needed (nerve pain). ) 90 capsule 1  . heparin lock flush 100 UNIT/ML SOLN injection Inject 2.5 mLs (250 Units total) into the vein daily for 30 doses. 75 mL 0  . ondansetron (ZOFRAN-ODT) 8 MG disintegrating tablet Take 1 tablet (8 mg total) by mouth every 8 (eight) hours as needed for nausea or vomiting. 40 tablet 1  . oxyCODONE (OXY IR/ROXICODONE) 5 MG immediate release tablet Take 1 tablet (5 mg total) by mouth every 6 (six) hours as needed for up to 7 days for moderate pain or severe pain. 30 tablet 0  . polyethylene glycol (MIRALAX / GLYCOLAX) packet Take 17 g by mouth daily.    . simethicone (MYLICON) 970 MG chewable tablet Chew 125 mg by mouth every 2 (two) hours as needed for flatulence.     . Thyroid (NATURE-THROID) 81.25 MG TABS Take 81.25 mg by mouth daily.     No current facility-administered medications for this visit.     OBJECTIVE: Middle aged white woman in no acute distress  Vitals:   11/29/17 1445  BP: (!) 169/95  Pulse: (!) 112  Resp: 18  Temp: 99 F (37.2 C)  SpO2: 100%     Body mass index is 20.9 kg/m.    ECOG FS: 2 Filed Weights   11/29/17 1445  Weight: 107 lb (48.5 kg)    Sclerae unicteric, pupils round and equal Oropharynx clear and moist No cervical or supraclavicular adenopathy Lungs no rales or rhonchi Heart regular rate and rhythm Abd soft, nontender,PEG tube in place, insertion site clean; colostomy in place, no liquid or stool in bag, which was recently changed; right abdominal wound healing by secondary intention MSK no focal spinal tenderness, no upper extremity lymphedema Neuro: nonfocal, well oriented, appropriate affect Breasts: No masses palpated.  Both axillae are benign    LAB RESULTS:   No results found for: CA125 Lab Results  Component Value Date   LABCA2 181 (H) 10/23/2015  No results found for: CEA    Lab Results  Component Value Date   WBC 14.6 (H) 11/28/2017   NEUTROABS 12.5 (H)  11/24/2017   HGB 8.7 (L) 11/28/2017   HCT 26.0 (L) 11/28/2017   MCV 87.5 11/28/2017   PLT 1,074 (HH) 11/28/2017      Chemistry      Component Value Date/Time   NA 135 11/28/2017 1421   NA 134 (L) 12/28/2016 1503   K 3.1 (L) 11/28/2017 1421   K 4.0 12/28/2016 1503   CL 97 (L) 11/28/2017 1421   CL 98 12/03/2012 1419   CO2 26 11/28/2017 1421   CO2 25 12/28/2016 1503   BUN 21 (H)  11/28/2017 1421   BUN 12.0 12/28/2016 1503   CREATININE 0.67 11/28/2017 1421   CREATININE 0.81 09/13/2017 1157   CREATININE 0.8 12/28/2016 1503      Component Value Date/Time   CALCIUM 9.3 11/28/2017 1421   CALCIUM 9.7 12/28/2016 1503   ALKPHOS 247 (H) 11/22/2017 0818   ALKPHOS 76 12/28/2016 1503   AST 39 11/22/2017 0818   AST 25 09/13/2017 1157   AST 22 12/28/2016 1503   ALT 34 11/22/2017 0818   ALT 12 09/13/2017 1157   ALT 20 12/28/2016 1503   BILITOT 0.7 11/22/2017 0818   BILITOT 0.6 09/13/2017 1157   BILITOT 0.50 12/28/2016 1503      STUDIES: Ct Abdomen Pelvis Wo Contrast  Result Date: 11/03/2017 CLINICAL DATA:  Abdominal distension with decreased ostomy output EXAM: CT ABDOMEN AND PELVIS WITHOUT CONTRAST TECHNIQUE: Multidetector CT imaging of the abdomen and pelvis was performed following the standard protocol without IV contrast. COMPARISON:  CT 07/10/2017, 05/22/2017, radiograph 11/02/2017 FINDINGS: Lower chest: Lung bases demonstrate small bilateral pleural effusions. No consolidation. Normal heart size. Hepatobiliary: No focal liver abnormality is seen. No gallstones, gallbladder wall thickening, or biliary dilatation. Pancreas: Unremarkable. No pancreatic ductal dilatation or surrounding inflammatory changes. Spleen: Normal in size without focal abnormality. Adrenals/Urinary Tract: Right adrenal gland is normal. Mild to moderate right hydronephrosis and hydroureter as before. Absent left kidney. Bladder within normal limits. Stomach/Bowel: Mild-to-moderate fluid distention of stomach.  Multiple dilated loops of proximal to mid small bowel, measuring up to 4.5 cm. Abduct transition to decompressed distal small bowel in the right mid to lower abdomen, series 2, image number 43, with minimal contrast within decompressed small bowel loops distal to this. No appreciable contrast in the colon. Status post left lower quadrant double-barrel colostomy. Heterogeneous soft tissue thickening at the anus and rectum, corresponding to previously noted mass. Vascular/Lymphatic: Nonaneurysmal aorta. Limited evaluation for adenopathy due to diffuse abdominal fluid. Reproductive: No adnexal mass. Uterus inseparable from soft tissue mass at the rectum and anus. Other: No free air. Increased abdominal and pelvic ascites, now moderate. Diffuse subcutaneous edema. Musculoskeletal: No acute or suspicious abnormality. IMPRESSION: 1. Multiple dilated loops of small bowel with abduct transition to decompressed distal small bowel in the mid to lower abdomen slightly to the right of midline, consistent with small bowel obstruction. A small amount of contrast does pass into decompressed small bowel distal to the point of obstruction. 2. Interim development of small pleural effusions. Development of moderate abdominopelvic ascites. Diffuse subcutaneous edema consistent with anasarca 3. Status post left lower quadrant colostomy. Soft tissue enlargement of the anus and rectum, corresponding to previously demonstrated infiltrative mass in the region. 4. Similar mild to moderate right hydronephrosis and slightly dilated right ureter. Electronically Signed   By: Donavan Foil M.D.   On: 11/03/2017 21:41   Dg Chest 1 View  Result Date: 11/20/2017 CLINICAL DATA:  Right thoracentesis. EXAM: CHEST  1 VIEW COMPARISON:  11/16/2017, 11/20/2017 FINDINGS: Small left pleural effusion. No significant right pleural effusion. No pneumothorax. No focal consolidation. Stable cardiomediastinal silhouette. Right-sided PICC line with the tip  projecting over the SVC. No acute osseous abnormality. IMPRESSION: 1. No right pneumothorax status post thoracentesis. Electronically Signed   By: Kathreen Devoid   On: 11/20/2017 11:54   Dg Chest 1 View  Result Date: 11/18/2017 CLINICAL DATA:  Right thoracentesis EXAM: CHEST  1 VIEW COMPARISON:  11/16/2017 chest radiograph. FINDINGS: Right PICC terminates in lower third of the SVC. Surgical clips overlie the left  lower chest. Stable cardiomediastinal silhouette with normal heart size. No pneumothorax. No significant residual right pleural effusion. Stable small left pleural effusion. No pulmonary edema. Persistent patchy left basilar lung opacity, unchanged. IMPRESSION: 1. No right pneumothorax. No significant residual right pleural effusion. 2. Stable small left pleural effusion and patchy left lung base opacity. Electronically Signed   By: Ilona Sorrel M.D.   On: 11/18/2017 16:38   Dg Chest 2 View  Result Date: 11/28/2017 CLINICAL DATA:  High blood pressure. Shortness of breath. Chest pain. EXAM: CHEST - 2 VIEW COMPARISON:  11/20/2017. FINDINGS: Right PICC line noted with tip over superior vena cava. Heart size normal. Bibasilar atelectasis/infiltrates. Small bilateral pleural effusions noted on today's exam. No pneumothorax. Gastrostomy tube noted over the left upper quadrant in stable position. IMPRESSION: 1. Right PICC line noted with tip over superior vena cava in stable position. 2. Bibasilar atelectasis. Small bilateral pleural effusions noted on today's exam. Electronically Signed   By: Marcello Moores  Register   On: 11/28/2017 14:15   Dg Abd 1 View  Result Date: 11/03/2017 CLINICAL DATA:  Small-bowel obstruction EXAM: ABDOMEN - 1 VIEW COMPARISON:  07/14/2017 FINDINGS: Moderate small bowel dilatation. Colon decompressed. Left lower quadrant ostomy. No renal calculi. Normal skeletal structures. IMPRESSION: Small-bowel obstruction. Electronically Signed   By: Franchot Gallo M.D.   On: 11/03/2017 08:44   Ct  Abdomen Pelvis W Contrast  Result Date: 11/17/2017 CLINICAL DATA:  Leukocytosis. Recent partial colectomy, small-bowel resection and colostomy. EXAM: CT ABDOMEN AND PELVIS WITH CONTRAST TECHNIQUE: Multidetector CT imaging of the abdomen and pelvis was performed using the standard protocol following bolus administration of intravenous contrast. CONTRAST:  148m ISOVUE-300 IOPAMIDOL (ISOVUE-300) INJECTION 61% COMPARISON:  Preoperative noncontrast abdominal CT 11/03/2017 FINDINGS: Lower chest: Moderate bilateral pleural effusions and adjacent airspace disease, on the right this represents compressive atelectasis, on the left may be atelectasis or pneumonia. The heart is normal in size. Prior left mastectomy. Hepatobiliary: No focal hepatic lesion. Gallbladder partially distended, no calcified gallstone. No biliary dilatation. Pancreas: No ductal dilatation or inflammation. Spleen: Normal in size without focal abnormality. Adrenals/Urinary Tract: No adrenal nodule. Prior left nephrectomy. Moderate right hydronephrosis and proximal hydroureter, not significantly changed from prior exams. More distal ureter is nondistended. Urinary bladder is partially distended. Posterior bladder wall thickening more prominent on the left. Stomach/Bowel: Gastrostomy tube in the stomach. Prior small bowel dilatation has resolved. Enteric sutures noted in small bowel in the lower pelvis. No bowel obstruction with enteric contrast reaching the transverse colon and left mid abdominal colostomy. Stabled off transverse and descending colon are decompressed. Irregular enhancing rectal mass extending into the perirectal soft tissues and uterus, direct comparison difficult given lack of enhancement on prior exam. No peristomal hernia. Vascular/Lymphatic: Abdominal aorta is normal in caliber. Portal, splenic, and mesenteric veins are patent. No acute vascular findings. No definite enlarged abdominal or pelvic lymph nodes. Small retroperitoneal  nodes are similar to prior exam. Reproductive: Rectal mass involves the uterus which is heterogeneous, enlarged, with ill-defined borders inferiorly. Other: Moderate volume intra-abdominal ascites which measures simple fluid density, however slight peritoneal enhancement is noted. Loculated pelvic fluid appears similar to prior exams. Generalized body wall edema and skin thickening. No free air in the abdomen. Midline skin staples noted. Musculoskeletal: Possible vague lucent lesion within L1 and L3 vertebral bodies, not definitively seen on prior exams. No sclerotic lesions. IMPRESSION: 1. Recent small bowel resection and transverse colostomy. Moderate volume of intra-abdominal ascites with slight peritoneal enhancement, but no well-defined abscess.  No free air. 2. Moderate bilateral pleural effusions and bibasilar airspace disease. On the right this is consistent with compressive atelectasis, on the left may be atelectasis or pneumonia in the setting of leukocytosis. Whole body wall edema and anasarca. 3. Enhancing rectal mass invading the uterus and possibly posterior wall of the bladder. 4. Stable right hydronephrosis and proximal hydroureter. 5. Possible lucent lesions within L1 and L3 vertebral bodies out or not seen on 07/10/2017 CT, and may represent osseous metastatic disease. This could be further assessed with nonemergent bone scan or PET-CT. Electronically Signed   By: Jeb Levering M.D.   On: 11/17/2017 00:19   US Paracentesis  Result Date: 11/17/2017 INDICATION: Patient with history of stage IV lobular breast cancer, recent small bowel resection/transverse colostomy, ascites, bilateral pleural effusions, rectal mass. Request made for diagnostic and therapeutic paracentesis. EXAM: ULTRASOUND GUIDED DIAGNOSTIC AND THERAPEUTIC PARACENTESIS MEDICATIONS: None COMPLICATIONS: None immediate. PROCEDURE: Informed written consent was obtained from the patient after a discussion of the risks, benefits and  alternatives to treatment. A timeout was performed prior to the initiation of the procedure. Initial ultrasound scanning demonstrates a small to moderate amount of loculated ascites within the right lower abdominal quadrant. The right lower abdomen was prepped and draped in the usual sterile fashion. 1% lidocaine was used for local anesthesia. Following this, a 6 Fr Safe-T-Centesis catheter was introduced. An ultrasound image was saved for documentation purposes. The paracentesis was performed. The catheter was removed and a dressing was applied. The patient tolerated the procedure well without immediate post procedural complication. FINDINGS: A total of approximately 570 cc of hazy, amber fluid was removed. Samples were sent to the laboratory as requested by the clinical team. Due to the multiloculated nature of the ascites only the above amount of fluid could be removed today. IMPRESSION: Successful ultrasound-guided diagnostic and therapeutic paracentesis yielding 570 cc of peritoneal fluid. Read by: Rowe Robert, PA-C Electronically Signed   By: Sandi Mariscal M.D.   On: 11/17/2017 16:31   Dg Chest Port 1 View  Result Date: 11/20/2017 CLINICAL DATA:  Shortness of breath. EXAM: PORTABLE CHEST 1 VIEW COMPARISON:  Single-view of the chest 11/19/2017, 11/18/2017 and 11/11/2017. FINDINGS: Left greater than right pleural effusions and basilar atelectasis appear unchanged since the most recent examination. No pneumothorax. Heart size is normal. Right PICC remains in place. Surgical clips left axilla noted. IMPRESSION: No change in left greater than right pleural effusions and basilar atelectasis since the most recent study. Electronically Signed   By: Inge Rise M.D.   On: 11/20/2017 08:46   Dg Chest Port 1 View  Result Date: 11/19/2017 CLINICAL DATA:  Dyspnea EXAM: PORTABLE CHEST 1 VIEW COMPARISON:  Chest radiograph from one day prior. FINDINGS: Right PICC terminates in lower third of the SVC. Surgical clips  overlie the left chest wall. Stable cardiomediastinal silhouette with normal heart size. No pneumothorax. Small bilateral pleural effusions are probably stable accounting for differences in positioning. No pulmonary edema. Left basilar atelectasis is stable. IMPRESSION: 1. Small bilateral pleural effusions, probably stable accounting for differences in positioning. 2. Left basilar atelectasis, stable. Electronically Signed   By: Ilona Sorrel M.D.   On: 11/19/2017 08:00   Dg Chest Port 1 View  Result Date: 11/16/2017 CLINICAL DATA:  Leukocytosis. EXAM: PORTABLE CHEST 1 VIEW COMPARISON:  Chest radiograph 11/11/2004, lung bases from abdominal CT 11/03/2017 FINDINGS: Left pleural effusion and basilar opacity, unchanged from prior exam. Small right pleural effusion has improved from prior exam. Mild residual  right basilar atelectasis. Heart is normal in size. No new airspace disease. Tip of the right upper extremity PICC in the SVC. Left mastectomy with surgical clips in the chest wall. IMPRESSION: Left pleural effusion and basilar opacity are unchanged from chest radiograph 5 days ago. Basilar opacity likely secondary to atelectasis, sterility technically indeterminate by radiograph. Improved right pleural effusion and atelectasis from prior exam. No new airspace disease. Electronically Signed   By: Jeb Levering M.D.   On: 11/16/2017 20:42   Dg Chest Port 1 View  Result Date: 11/11/2017 CLINICAL DATA:  Acute kidney injury.  Chest pain. EXAM: PORTABLE CHEST 1 VIEW COMPARISON:  10/31/2016 FINDINGS: Right arm PICC tip in the SVC 4 cm above the right atrium. Bilateral pleural effusions with dependent atelectasis. Upper lungs are clear. Previous mastectomy on the left. No acute bone finding. IMPRESSION: Bilateral effusions with dependent pulmonary atelectasis. Electronically Signed   By: Nelson Chimes M.D.   On: 11/11/2017 15:17   Dg Abd 2 Views  Result Date: 11/07/2017 CLINICAL DATA:  62 year old female with  small bowel obstruction. Recent colonic surgery. Subsequent encounter. EXAM: ABDOMEN - 2 VIEW COMPARISON:  11/05/2017 plain film examination.  11/03/2017 CT. FINDINGS: Gas and fluid distended dilated small bowel loops more prominent than on the recent plain film examination with small bowel measuring up to 4.7 cm versus prior 4.5 cm. Small bowel fold thickening. Air-fluid levels. Interval development of gas filled stomach. Minimal residual contrast within colon. Colostomy left lower quadrant. No free intraperitoneal air noted. IMPRESSION: Progressive partial small bowel obstructive pattern. Interval development of gas-filled stomach. No free intraperitoneal air. These results will be called to the ordering clinician or representative by the Radiologist Assistant, and communication documented in the PACS or zVision Dashboard. Electronically Signed   By: Genia Del M.D.   On: 11/07/2017 09:05   Dg Abd 2 Views  Result Date: 11/05/2017 CLINICAL DATA:  Per order- small bowel obstruction PT HX: GERD, bladder sugery EXAM: ABDOMEN - 2 VIEW COMPARISON:  CT 11/03/2017 and previous FINDINGS: Visualized lung bases clear.  No free air. Multiple dilated small bowel loops with fluid levels on the erect radiograph. Similar number of involved loops and degree of dilatation since previous radiograph 11/02/2017. There has been progression of oral contrast material into the proximal colon which is nondilated. There is a left lower quadrant ostomy device. No abnormal abdominal calcifications.  Regional bones unremarkable. IMPRESSION: 1. Findings consistent with persistent partial mid/distal small bowel obstruction. 2. No free air. Electronically Signed   By: Lucrezia Europe M.D.   On: 11/05/2017 12:23   [place] Ekg Site Rite  Result Date: 11/04/2017 If Site Rite image not attached, placement could not be confirmed due to current cardiac rhythm.  US Thoracentesis Asp Pleural Space W/img Guide  Result Date: 11/20/2017 INDICATION:  Patient with history of stage IV lobular breast cancer, recent small bowel resection/transverse colostomy, ascites, bilateral pleural effusions, rectal mass. Request made for diagnostic and therapeutic left thoracentesis. EXAM: ULTRASOUND GUIDED DIAGNOSTIC AND THERAPEUTIC LEFT THORACENTESIS MEDICATIONS: None COMPLICATIONS: None immediate. PROCEDURE: An ultrasound guided thoracentesis was thoroughly discussed with the patient and questions answered. The benefits, risks, alternatives and complications were also discussed. The patient understands and wishes to proceed with the procedure. Written consent was obtained. Ultrasound was performed to localize and mark an adequate pocket of fluid in the left chest. The area was then prepped and draped in the normal sterile fashion. 1% Lidocaine was used for local anesthesia. Under ultrasound guidance a  6 Fr Safe-T-Centesis catheter was introduced. Thoracentesis was performed. The catheter was removed and a dressing applied. FINDINGS: A total of approximately 170 cc of yellow fluid was removed. Samples were sent to the laboratory as requested by the clinical team. IMPRESSION: Successful ultrasound guided diagnostic and therapeutic left thoracentesis yielding 170 cc of pleural fluid. Read by: Rowe Robert, PA-C Electronically Signed   By: Markus Daft M.D.   On: 11/20/2017 11:28   US Thoracentesis Asp Pleural Space W/img Guide  Result Date: 11/18/2017 INDICATION: Pleural effusions, metastatic breast cancer EXAM: ULTRASOUND GUIDED RIGHT THORACENTESIS MEDICATIONS: 1% lidocaine local COMPLICATIONS: None immediate. PROCEDURE: An ultrasound guided thoracentesis was thoroughly discussed with the patient and questions answered. The benefits, risks, alternatives and complications were also discussed. The patient understands and wishes to proceed with the procedure. Written consent was obtained. Ultrasound was performed to localize and mark an adequate pocket of fluid in the right  chest. The area was then prepped and draped in the normal sterile fashion. 1% Lidocaine was used for local anesthesia. Under ultrasound guidance a 6 Fr Safe-T-Centesis catheter was introduced. Thoracentesis was performed. The catheter was removed and a dressing applied. FINDINGS: A total of approximately 230 cc of serosanguineous pleural fluid was removed. Samples were sent to the laboratory as requested by the clinical team. IMPRESSION: Successful ultrasound guided right thoracentesis yielding 230 cc of pleural fluid. Electronically Signed   By: Jerilynn Mages.  Shick M.D.   On: 11/18/2017 13:44     ASSESSMENT: 62 y.o.  BRCA 1-2 negative [facility] woman with lobular breast cancer stage IV at presentation July 2011  (1) status post left mastectomy and sentinel lymph node sampling in July 2011 for a lower inner quadrant T1 N1 M1, stage IV invasive lobular breast cancer, grade 1, strongly estrogen and progesterone receptor-positive, HER2 negative with MIB-1 of 9% and no HER2 amplification,   (2) with multiple sclerotic bone lesions at presentation seen only on CT scan (not on bone scan or PET scan), but  with biopsy-proven metastatic disease to bone; and an elevated CA 27.29 at presentation,   (3) Oncotype recurrence score of 4, predicts a good response to antiestrogens.  (4) Systemic treatment has consisted of  a) tamoxifen with evidence of response but poor tolerance  b) letrozole starting August 2012, discontinued October 2014 per patient   (5) single functioning kidney  (6) status post bilateral salpingo-oophorectomy 01/24/2013, with benign pathology  (7) osteoporosis; the patient refuses bisphosphonate therapy; started [facility] February 2014.    (a) bone density was obtained under the care of Dr. Matthew Saras at [facility], 06/25/2012, showing osteoporosis with a T score -2.6. This was repeated 12/20/2013, showing again osteoporosis with T-scores between -2.4 and -2.8.  (b) the  patient refuses zolendronate, denosumab, or other pharmacologic intervention  (c) bone density on 12/30/2015 under Dr. Matthew Saras showing osteoporosis with T scores between -2.7 and -3.3  (8) the patient refuses standard Mammography or tomography; undergoing thermography screening of the right breast.  (9) PET scan 06/05/2015 shows rectal thickening and a presacral mass; biopsy of this area 10/09/2015 confirms metastatic lobular breast cancer, again estrogen receptor positive, HER-2 nonamplified.  (a) pelvic MRI 03/11/2016 confirms stability of disease  (b) chest CT and pelvic MRI 09/02/2016 shows no change in the circumferential rectal thickening or evidence of extension beyond the serosa and chest CT findings  (c) colonoscopy 02/23/2017 showed a very narrow rectal lumen with a few apparently uninvolved centimeters distally   (d) CT of  the abdomen and pelvis 05/22/2017 (after diverting colostomy) shows interval increase in the size of the perirectal soft tissue masses.  (10) started fulvestrant and palbociclib 125 mg/ day [21/7] May 2017  (a) palbociclib dose decreased to 100 mg daily [21/7] with second cycle, started 11/25/2015  (b) palbociclib discontinued 03/20/2017 with evidence of disease progression  (c) last fulvestrant dose 03/24/2017, discontinued with evidence of progression  (11) status post colostomy placement [facility] November 2018  (12) started exemestane 07/05/2017,  (a) everolimus 5 mg/d started 07/14/2017--held as of 08/20/2017 secondary to rash  (b) exemestane stopped by patient 09/04/2017 to try "the natural way".  13) exemestane resumed 10/16/2017             (a) abemaciclib/Verzenio added 11/29/2017  (14) s/p laparoscopic small bowel resection for SBO 11/08/2017             (a) peritoneal fluid 11/17/2017 growing enterococcus, vanc sensitive  (b) completed antibiotics 11/27/2017    PLAN:  Sonia Hill has had a very difficult past few months because of  recurrent small bowel obstruction.  We are really appreciative of surgery's intervention which has allowed her to now eat normal food.  She is doing terrific with that and I am hopeful first that she will gain some weight and to that her albumin will slowly improve so that her peripheral edema will become less of a problem.  Sonia Hill is tolerating the exemestane well.  At this point I think we can add the abemaciclib.  She has a good understanding of the possible toxicities, side effects and complications of this agent.  She is going to see me again on 12/19/2017 just to make sure she is tolerating things well there have been no further problems with bowel obstruction, and her right abdominal wound continues to heal well.  They are planning a trip to [place] with a stop in [place].  She will need a note for me so that she can take some extra food and equipment on the plane I will be glad to provide that for her  The plan then will be to restage after she has been 2 months on the current agents  She knows to call for any other issues that may develop before the next visit.   [place] Joshi, Virgie Dad, MD  11/29/17 3:38 PM Medical Oncology and Hematology [facility] [address] [place], [place] [address] Tel. [phone]    Fax. [phone]  Alice Rieger, am acting as scribe for Chauncey Cruel MD.  I, Lurline Del MD, have reviewed the above documentation for accuracy and completeness, and I agree with the above.

## 2017-11-28 NOTE — ED Provider Notes (Signed)
[facility] EMERGENCY DEPARTMENT Provider Note   CSN: [id number] Arrival date & time: 11/28/17  1338     History   Chief Complaint Chief Complaint  Patient presents with  . Chest Pain    HPI Sonia Hill is a 62 y.o. female.  HPI Patient is a 62 year old female presents the emergency department with complaints of elevated blood pressure and chest pain which began this morning.  Her chest pain is been constant and has been aching in nature.  She reports transient shortness of breath.  No nausea or vomiting.  No diaphoresis. Recently hospitalized   Past Medical History:  Diagnosis Date  . Arthritis    neck, upper back, shoulders - no meds - yoga  . Chronic kidney disease    Only has right kidney  . Depression    Hx - no current problem  . GERD (gastroesophageal reflux disease)    diet controlled - no meds  . Hyperlipidemia    diet controlled  . Hypothyroidism   . lt breast ca dx'd 11/2009  . Osteoporosis, unspecified 12/06/2013  . PONV (postoperative nausea and vomiting)   . SBO (small bowel obstruction) ([place])   . SVD (spontaneous vaginal delivery)    x 1    Patient Active Problem List   Diagnosis Date Noted  . Anemia 11/12/2017  . Malnutrition of moderate degree 11/11/2017  . Metastatic breast cancer to small intestine causing SBO s/p SB resection 11/08/2017 11/10/2017  . 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 Cachexia ([place]) 11/10/2017  . New onset left bundle branch block (LBBB) 11/09/2017  . Colostomy obstruction from metastasis s/p colectomy/ostomy revision 11/08/2017 11/08/2017  . 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 Ascites 11/08/2017  . Hypoglycemia 11/04/2017  . Hypokalemia 11/04/2017  . Hypocalcemia 11/04/2017  . Small bowel obstruction from metastasis s/p SB resection 11/08/2017 11/03/2017  . Hydronephrosis 10/24/2017  . 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 Hyponatremia 10/16/2017  . Protein-calorie malnutrition, severe 07/11/2017  . Breast cancer metastasized to multiple sites ([place]) 10/16/2015  . Genetic testing 06/24/2015  . Osteoporosis 12/06/2013  . Cancer of lower-inner quadrant of left female breast ([place]) 03/22/2013  . Breast cancer metastasized to bone [facility]) 10/03/2012    Past Surgical History:  Procedure Laterality Date  . ABDOMINAL SURGERY    . BLADDER NECK RECONSTRUCTION     made a new urethea tube  . bladder tact  2008  . BOWEL RESECTION N/A 11/08/2017   Procedure: SMALL BOWEL RESECTION;  Surgeon: Michael Boston, MD;  Location: [facility];  Service: General;  Laterality: N/A;  . BREAST SURGERY     masectomy left breast  . coloscopy    . COLOSTOMY    . DILATION AND CURETTAGE OF UTERUS    . GASTROSTOMY N/A 11/08/2017   Procedure: INSERTION OF GASTROSTOMY TUBE;  Surgeon: Michael Boston, MD;  Location: [facility];  Service: General;  Laterality: N/A;  . LAPAROSCOPY N/A 01/24/2013   Procedure: LAPROSCOPY OPERATIVE;  Surgeon: Margarette Asal, MD;  Location: [facility];  Service: Gynecology;  Laterality: N/A;  . LAPAROSCOPY N/A 11/08/2017   Procedure: LAPAROSCOPY DIAGNOSTIC;  Surgeon: Michael Boston, MD;  Location: [facility];  Service: General;  Laterality: N/A;  . LYSIS OF ADHESION N/A 11/08/2017   Procedure: LYSIS OF ADHESION;  Surgeon: Michael Boston, MD;  Location: [facility];  Service: General;  Laterality: N/A;  . NEPHRECTOMY Left    left  kidney dysfunction, recurrent pyelonephritis  . PARTIAL COLECTOMY N/A 11/08/2017   Procedure: PARTIAL COLECTOMY WITH NEW COLOSTOMY;  Surgeon: Michael Boston, MD;  Location: [facility];  Service: General;  Laterality: N/A;  . SALPINGOOPHORECTOMY Bilateral 01/24/2013   Procedure: SALPINGO OOPHORECTOMY;  Surgeon: Margarette Asal, MD;  Location: [facility];  Service: Gynecology;  Laterality: Bilateral;  . WISDOM TOOTH EXTRACTION       OB History   None      Home Medications    Prior to Admission  medications   Medication Sig Start Date End Date Taking? Authorizing Provider  ALPRAZolam (XANAX) 0.25 MG tablet TAKE 1 TABLET BY MOUTH EVERY 8 HOURS AS NEEDED FOR ANXIETY 11/27/17   Magrinat, Virgie Dad, MD  cholecalciferol (D-VI-SOL) 400 UNIT/ML LIQD Take 400 Units by mouth daily.    [provider]  exemestane (AROMASIN) 25 MG tablet Take 1 tablet (25 mg total) by mouth daily after breakfast. 07/05/17   Magrinat, Virgie Dad, MD  furosemide (LASIX) 40 MG tablet Take 1 tablet (40 mg total) by mouth 2 (two) times daily. 11/24/17 12/24/17  Alma Friendly, MD  gabapentin (NEURONTIN) 300 MG capsule Take 1 capsule (300 mg total) by mouth 3 (three) times daily as needed. Patient taking differently: Take 300 mg by mouth 3 (three) times daily as needed (nerve pain).  06/28/17   Magrinat, Virgie Dad, MD  heparin lock flush 100 UNIT/ML SOLN injection Inject 2.5 mLs (250 Units total) into the vein daily for 30 doses. 11/24/17 12/24/17  Alma Friendly, MD  ondansetron (ZOFRAN-ODT) 8 MG disintegrating tablet Take 1 tablet (8 mg total) by mouth every 8 (eight) hours as needed for nausea or vomiting. 06/22/17   Magrinat, Virgie Dad, MD  oxyCODONE (OXY IR/ROXICODONE) 5 MG immediate release tablet Take 1 tablet (5 mg total) by mouth every 6 (six) hours as needed for up to 7 days for moderate pain or severe pain. 11/24/17 12/01/17  Alma Friendly, MD  polyethylene glycol (MIRALAX / Floria Raveling) packet Take 17 g by mouth daily.    [provider]  simethicone (MYLICON) 063 MG chewable tablet Chew 125 mg by mouth every 2 (two) hours as needed for flatulence.     [provider]  Thyroid (NATURE-THROID) 81.25 MG TABS Take 81.25 mg by mouth daily.    [provider]    Family History Family History  Problem Relation Age of Onset  . Breast cancer Mother 43  . Breast cancer Maternal Aunt 58  . Breast cancer Other        MGF's sister dx with breast cancer in her 59s    Social  History Social History   Tobacco Use  . Smoking status: Never Smoker  . Smokeless tobacco: Never Used  Substance Use Topics  . Alcohol use: No  . Drug use: No     Allergies   Sulfa antibiotics; Adhesive [tape]; Betadine [povidone iodine]; and Mercury   Review of Systems Review of Systems  All other systems reviewed and are negative.    Physical Exam Updated Vital Signs BP 135/73 (BP Location: Right Leg)   Pulse (!) 114   Temp 99.1 F (37.3 C) (Oral)   Resp (!) 24   Ht 5' (1.524 m)   Wt 51.7 kg (114 lb)   SpO2 98%   BMI 22.26 kg/m   Physical Exam  Constitutional: She is oriented to person, place, and time. She appears well-developed and well-nourished. No distress.  HENT:  Head: Normocephalic and  atraumatic.  Eyes: EOM are normal.  Neck: Normal range of motion.  Cardiovascular: Normal rate, regular rhythm and normal heart sounds.  Pulmonary/Chest: Effort normal and breath sounds normal.  Abdominal: Soft. She exhibits no distension. There is no tenderness.  Musculoskeletal: Normal range of motion. She exhibits no edema.  Neurological: She is alert and oriented to person, place, and time.  Skin: Skin is warm and dry.  Psychiatric: She has a normal mood and affect. Judgment normal.  Nursing note and vitals reviewed.    ED Treatments / Results  Labs (all labs ordered are listed, but only abnormal results are displayed) Labs Reviewed  BASIC METABOLIC PANEL - Abnormal; Notable for the following components:      Result Value   Potassium 3.1 (*)    Chloride 97 (*)    BUN 21 (*)    All other components within normal limits  CBC - Abnormal; Notable for the following components:   WBC 14.6 (*)    RBC 2.97 (*)    Hemoglobin 8.7 (*)    HCT 26.0 (*)    Platelets 1,074 (*)    All other components within normal limits  TROPONIN I  PATHOLOGIST SMEAR REVIEW    EKG EKG Interpretation  Date/Time:  Tuesday November 28 2017 13:51:33 EDT Ventricular Rate:  110 PR  Interval:    QRS Duration: 127 QT Interval:  338 QTC Calculation: 458 R Axis:   46 Text Interpretation:  Sinus tachycardia Left bundle branch block No significant change was found Confirmed by Jola Schmidt [id number]) on 11/28/2017 1:56:35 PM   Radiology Dg Chest 2 View  Result Date: 11/28/2017 CLINICAL DATA:  High blood pressure. Shortness of breath. Chest pain. EXAM: CHEST - 2 VIEW COMPARISON:  11/20/2017. FINDINGS: Right PICC line noted with tip over superior vena cava. Heart size normal. Bibasilar atelectasis/infiltrates. Small bilateral pleural effusions noted on today's exam. No pneumothorax. Gastrostomy tube noted over the left upper quadrant in stable position. IMPRESSION: 1. Right PICC line noted with tip over superior vena cava in stable position. 2. Bibasilar atelectasis. Small bilateral pleural effusions noted on today's exam. Electronically Signed   By: Marcello Moores  Register   On: 11/28/2017 14:15    Procedures Procedures (including critical care time)  Medications Ordered in ED Medications - No data to display   Initial Impression / Assessment and Plan / ED Course  I have reviewed the triage vital signs and the nursing notes.  Pertinent labs & imaging results that were available during my care of the patient were reviewed by me and considered in my medical decision making (see chart for details).     Transient chest pain.  Work-up in the emergency department without significant abnormality.  Elevated blood pressure reading at home however blood pressures have improved here in the emergency department without intervention.  Primary care follow-up.  No indication for additional testing at this time.  Patient understands return to the emergency department for new or worsening symptoms  Final Clinical Impressions(s) / ED Diagnoses   Final diagnoses:  Elevated blood pressure reading  Weakness    ED Discharge Orders    None       Jola Schmidt, MD 12/03/17 [id number]

## 2017-11-28 NOTE — Discharge Instructions (Addendum)
Follow-up with your oncologist tomorrow.

## 2017-11-28 NOTE — ED Triage Notes (Signed)
Pt c/o high B/P that was noticed yesterday, CP that started this AM. CP is described as aching. Pt has associated ShOB. Denies nausea, diaphoresis. Pt was recently hospitalized with abd sx.

## 2017-11-29 ENCOUNTER — Telehealth: Payer: Self-pay | Admitting: *Deleted

## 2017-11-29 ENCOUNTER — Inpatient Hospital Stay (HOSPITAL_BASED_OUTPATIENT_CLINIC_OR_DEPARTMENT_OTHER): Payer: 59 | Admitting: Oncology

## 2017-11-29 ENCOUNTER — Inpatient Hospital Stay: Payer: 59 | Attending: Oncology

## 2017-11-29 ENCOUNTER — Telehealth: Payer: Self-pay | Admitting: Oncology

## 2017-11-29 VITALS — BP 169/95 | HR 112 | Temp 99.0°F | Resp 18 | Ht 60.0 in | Wt 107.0 lb

## 2017-11-29 DIAGNOSIS — Z79811 Long term (current) use of aromatase inhibitors: Secondary | ICD-10-CM | POA: Diagnosis not present

## 2017-11-29 DIAGNOSIS — C50312 Malignant neoplasm of lower-inner quadrant of left female breast: Secondary | ICD-10-CM

## 2017-11-29 DIAGNOSIS — F329 Major depressive disorder, single episode, unspecified: Secondary | ICD-10-CM

## 2017-11-29 DIAGNOSIS — M129 Arthropathy, unspecified: Secondary | ICD-10-CM | POA: Diagnosis not present

## 2017-11-29 DIAGNOSIS — Z9012 Acquired absence of left breast and nipple: Secondary | ICD-10-CM | POA: Insufficient documentation

## 2017-11-29 DIAGNOSIS — K56609 Unspecified intestinal obstruction, unspecified as to partial versus complete obstruction: Secondary | ICD-10-CM

## 2017-11-29 DIAGNOSIS — Z87442 Personal history of urinary calculi: Secondary | ICD-10-CM | POA: Diagnosis not present

## 2017-11-29 DIAGNOSIS — R601 Generalized edema: Secondary | ICD-10-CM

## 2017-11-29 DIAGNOSIS — J90 Pleural effusion, not elsewhere classified: Secondary | ICD-10-CM | POA: Insufficient documentation

## 2017-11-29 DIAGNOSIS — K219 Gastro-esophageal reflux disease without esophagitis: Secondary | ICD-10-CM | POA: Diagnosis not present

## 2017-11-29 DIAGNOSIS — Z8719 Personal history of other diseases of the digestive system: Secondary | ICD-10-CM | POA: Insufficient documentation

## 2017-11-29 DIAGNOSIS — C785 Secondary malignant neoplasm of large intestine and rectum: Secondary | ICD-10-CM

## 2017-11-29 DIAGNOSIS — E785 Hyperlipidemia, unspecified: Secondary | ICD-10-CM | POA: Insufficient documentation

## 2017-11-29 DIAGNOSIS — F418 Other specified anxiety disorders: Secondary | ICD-10-CM | POA: Insufficient documentation

## 2017-11-29 DIAGNOSIS — Z79899 Other long term (current) drug therapy: Secondary | ICD-10-CM | POA: Insufficient documentation

## 2017-11-29 DIAGNOSIS — M818 Other osteoporosis without current pathological fracture: Secondary | ICD-10-CM

## 2017-11-29 DIAGNOSIS — N1330 Unspecified hydronephrosis: Secondary | ICD-10-CM | POA: Insufficient documentation

## 2017-11-29 DIAGNOSIS — R188 Other ascites: Secondary | ICD-10-CM | POA: Diagnosis not present

## 2017-11-29 DIAGNOSIS — Z803 Family history of malignant neoplasm of breast: Secondary | ICD-10-CM | POA: Diagnosis not present

## 2017-11-29 DIAGNOSIS — M810 Age-related osteoporosis without current pathological fracture: Secondary | ICD-10-CM

## 2017-11-29 DIAGNOSIS — Z170 Estrogen receptor positive status [ER+]: Secondary | ICD-10-CM | POA: Diagnosis not present

## 2017-11-29 DIAGNOSIS — C50912 Malignant neoplasm of unspecified site of left female breast: Secondary | ICD-10-CM

## 2017-11-29 DIAGNOSIS — Z905 Acquired absence of kidney: Secondary | ICD-10-CM

## 2017-11-29 DIAGNOSIS — E039 Hypothyroidism, unspecified: Secondary | ICD-10-CM | POA: Insufficient documentation

## 2017-11-29 DIAGNOSIS — M899 Disorder of bone, unspecified: Secondary | ICD-10-CM | POA: Insufficient documentation

## 2017-11-29 DIAGNOSIS — C7951 Secondary malignant neoplasm of bone: Secondary | ICD-10-CM

## 2017-11-29 DIAGNOSIS — Z931 Gastrostomy status: Secondary | ICD-10-CM | POA: Diagnosis not present

## 2017-11-29 DIAGNOSIS — C784 Secondary malignant neoplasm of small intestine: Secondary | ICD-10-CM

## 2017-11-29 DIAGNOSIS — R1900 Intra-abdominal and pelvic swelling, mass and lump, unspecified site: Secondary | ICD-10-CM | POA: Diagnosis not present

## 2017-11-29 DIAGNOSIS — E440 Moderate protein-calorie malnutrition: Secondary | ICD-10-CM

## 2017-11-29 DIAGNOSIS — C786 Secondary malignant neoplasm of retroperitoneum and peritoneum: Secondary | ICD-10-CM

## 2017-11-29 DIAGNOSIS — C801 Malignant (primary) neoplasm, unspecified: Secondary | ICD-10-CM

## 2017-11-29 MED ORDER — ABEMACICLIB 100 MG PO TABS: 100.0000 mg | ORAL_TABLET | Freq: Two times a day (BID) | ORAL | 4 refills | Status: DC

## 2017-11-29 MED ORDER — FUROSEMIDE 40 MG PO TABS: 40.0000 mg | ORAL_TABLET | Freq: Every day | ORAL | 0 refills | Status: DC

## 2017-11-29 NOTE — Telephone Encounter (Signed)
This RN was asked to draw labs from PICC. Noted dressing dated 11/18/2017 with Almyra Free stating home nurse would be coming out Friday 11/30/2017 for dressing change.  This RN changed dressing- site was dry, no redness and intact at insertion site.  Per flush - red lumen flushed easily with blood return, purple lumen was more sluggish in flush with no blood return.  Pt and husband states purple line " doesn't flush as easy as the red but the red is where all the blood gets drawn from "  Pt tolerated dressing change and flush with no stated discomfort.

## 2017-11-29 NOTE — Telephone Encounter (Signed)
Gave patient avs and calendar of upcoming July appointments.  °

## 2017-11-30 ENCOUNTER — Other Ambulatory Visit: Payer: Self-pay | Admitting: Pharmacist

## 2017-11-30 ENCOUNTER — Telehealth: Payer: Self-pay | Admitting: Oncology

## 2017-11-30 DIAGNOSIS — C50312 Malignant neoplasm of lower-inner quadrant of left female breast: Secondary | ICD-10-CM

## 2017-11-30 LAB — CANCER ANTIGEN 27.29: CA 27.29: 286.9 U/mL — ABNORMAL HIGH (ref 0.0–38.6)

## 2017-11-30 MED ORDER — ABEMACICLIB 100 MG PO TABS: 100.0000 mg | ORAL_TABLET | Freq: Two times a day (BID) | ORAL | 5 refills | Status: DC

## 2017-11-30 NOTE — Telephone Encounter (Signed)
Oral Oncology Pharmacist Encounter  New prescription for Verzenio 100mg  tablets, 1 tablet by mouth 2 times daily without regard to meals e-scribed to [facility] per insurance requirement.Johny Drilling, PharmD, BCPS, BCOP  11/30/2017   8:36 AM Oral Oncology Clinic [phone]

## 2017-11-30 NOTE — Telephone Encounter (Signed)
Per 6/19 no los °

## 2017-12-01 ENCOUNTER — Ambulatory Visit
Admission: RE | Admit: 2017-12-01 | Discharge: 2017-12-01 | Disposition: A | Discharge status: Home / Self Care | Payer: 59 | Source: Ambulatory Visit | Attending: Student | Admitting: Student

## 2017-12-01 ENCOUNTER — Telehealth: Payer: Self-pay | Admitting: *Deleted

## 2017-12-01 ENCOUNTER — Other Ambulatory Visit: Payer: Self-pay | Admitting: Student

## 2017-12-01 DIAGNOSIS — R109 Unspecified abdominal pain: Secondary | ICD-10-CM

## 2017-12-01 MED ORDER — IOHEXOL 300 MG/ML  SOLN
100.0000 mL | Freq: Once | INTRAMUSCULAR | Status: AC | PRN
  Administered 2017-12-01: 100 mL via INTRAVENOUS

## 2017-12-01 NOTE — Telephone Encounter (Signed)
This RN spoke with pt to clarify her question regarding " what dose of Verzenio does Dr [name] want me to take - I have the 150 mg tablets ?"  This RN informed her pt Dr [name] - she is to take the Verzenio 150 only 1 time a day until her new dose of 100 mg tablets come in per new order.  Arial verbalized understanding.

## 2017-12-04 ENCOUNTER — Other Ambulatory Visit: Payer: Self-pay | Admitting: Oncology

## 2017-12-04 NOTE — Progress Notes (Unsigned)
Sonia Hill developed some erythema or cellulitis in the lower abdomen and saw Dr. gross on 12/01/2017.  He will obtain a CT of the abdomen and pelvis, which showed ascites and anasarca.  There was no evidence of bowel obstruction.  There was a question of possible peritonitis.  She was started back on Augmentin and is scheduled to see Dr. gross again on 12/06/2017.  She tells me that she is eating 6 meals a day and beginning to gain some weight.  Dg Chest 1 View  Result Date: 11/20/2017 CLINICAL DATA:  Right thoracentesis. EXAM: CHEST  1 VIEW COMPARISON:  11/16/2017, 11/20/2017 FINDINGS: Small left pleural effusion. No significant right pleural effusion. No pneumothorax. No focal consolidation. Stable cardiomediastinal silhouette. Right-sided PICC line with the tip projecting over the SVC. No acute osseous abnormality. IMPRESSION: 1. No right pneumothorax status post thoracentesis. Electronically Signed   By: Kathreen Devoid   On: 11/20/2017 11:54   Dg Chest 1 View  Result Date: 11/18/2017 CLINICAL DATA:  Right thoracentesis EXAM: CHEST  1 VIEW COMPARISON:  11/16/2017 chest radiograph. FINDINGS: Right PICC terminates in lower third of the SVC. Surgical clips overlie the left lower chest. Stable cardiomediastinal silhouette with normal heart size. No pneumothorax. No significant residual right pleural effusion. Stable small left pleural effusion. No pulmonary edema. Persistent patchy left basilar lung opacity, unchanged. IMPRESSION: 1. No right pneumothorax. No significant residual right pleural effusion. 2. Stable small left pleural effusion and patchy left lung base opacity. Electronically Signed   By: Ilona Sorrel M.D.   On: 11/18/2017 16:38   Dg Chest 2 View  Result Date: 11/28/2017 CLINICAL DATA:  High blood pressure. Shortness of breath. Chest pain. EXAM: CHEST - 2 VIEW COMPARISON:  11/20/2017. FINDINGS: Right PICC line noted with tip over superior vena cava. Heart size normal. Bibasilar  atelectasis/infiltrates. Small bilateral pleural effusions noted on today's exam. No pneumothorax. Gastrostomy tube noted over the left upper quadrant in stable position. IMPRESSION: 1. Right PICC line noted with tip over superior vena cava in stable position. 2. Bibasilar atelectasis. Small bilateral pleural effusions noted on today's exam. 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 Patient status post multiple bowel surgeries for carcinomatosis related bowel obstruction. Patient most recent surgery 11/08/2017 with new ostomy. Remote history of LEFT nephrectomy. EXAM: CT ABDOMEN AND PELVIS WITH CONTRAST TECHNIQUE: Multidetector CT imaging of the abdomen and pelvis was performed using the standard protocol following bolus administration of intravenous contrast. CONTRAST:  130mL OMNIPAQUE IOHEXOL 300 MG/ML  SOLN COMPARISON:  CT 11/16/2017 FINDINGS: Lower chest: Moderate bilateral pleural effusions with bibasilar atelectasis. Hepatobiliary: No focal hepatic lesion. No biliary duct dilatation. Gallbladder is normal. Common bile duct is normal. Pancreas: Pancreas is normal. No ductal dilatation. No pancreatic inflammation. Spleen: Normal spleen Adrenals/urinary tract: Adrenal glands are normal. Hydronephrosis and hydroureter of the solitary RIGHT kidney not changed significantly from comparison exams. Stomach/Bowel: Percutaneous gastrostomy tube in the stomach. The duodenum and small bowel grossly normal. There is enteric colonic anastomosis. No small bowel dilatation or inflammation identified. No pneumatosis. The colon is nonobstructed. LEFT abdominal wall loop ostomy noted. No obstruction. No peristomal hernia. No subcutaneous infection associated with the colostomy. Vascular/Lymphatic: Abdominal aorta is normal caliber. No  periportal or retroperitoneal adenopathy. No pelvic adenopathy. Reproductive: Post hysterectomy. Other: There is a large heterogeneous  peripherally enhancing mass in the posterior cul-de-sac which extends out of the pelvis consistent with masslike carcinomatosis. Mass measures 8.6 by 6.0 cm (image 64/13). This is similar to 9.1 by 5.1 cm. Superior portion the mass measures 5 cm on image 58/13 not changed. Carcinomatosis extends the pelvic floor and perineum (image 78/13). There is a large volume intraperitoneal free fluid. Along the visceral and parietal peritoneal surfaces there is a thin enhancing rim. This is seen in the LEFT lower quadrant on image 53/13 and along the RIGHT pericolic gutter on image 85/46. Findings are consistent with peritonitis. There is no organized fluid collections to suggest abscess. The intraperitoneal free fluid appears freely communicate throughout the abdomen. Musculoskeletal: No aggressive osseous lesion. Anasarca of the soft tissues. IMPRESSION: 1. No evidence of bowel obstruction or bowel complication following surgery. gastrostomy tube in proper position. Ostomy appears normal without evidence of complication or infection. 2. Moderate to large volume intraperitoneal free fluid appears to be freely communicate throughout the peritoneal space. However, there is a thin enhancing peritoneal surface throughout the abdomen and pelvis associated with the visceral and parietal peritoneal surface consistent with peritonitis. 3. Bilateral pleural effusions and anasarca of the soft tissues consistent with volume overload/hypo of anemia. 4. Bibasilar passive atelectasis the lungs. 5. 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 Mass extends to the perineum. Findings conveyed toDr.  Alta Corning 12/01/2017  at17:38. Electronically Signed   By: Suzy Bouchard M.D.   On: 12/01/2017 17:43   Ct Abdomen Pelvis W Contrast  Result Date: 11/17/2017 CLINICAL DATA:   Leukocytosis. Recent partial colectomy, small-bowel resection and colostomy. EXAM: CT ABDOMEN AND PELVIS WITH CONTRAST TECHNIQUE: Multidetector CT imaging of the abdomen and pelvis was performed using the standard protocol following bolus administration of intravenous contrast. CONTRAST:  135mL ISOVUE-300 IOPAMIDOL (ISOVUE-300) INJECTION 61% COMPARISON:  Preoperative noncontrast abdominal CT 11/03/2017 FINDINGS: Lower chest: Moderate bilateral pleural effusions and adjacent airspace disease, on the right this represents compressive atelectasis, on the left may be atelectasis or pneumonia. The heart is normal in size. Prior left mastectomy. Hepatobiliary: No focal hepatic lesion. Gallbladder partially distended, no calcified gallstone. No biliary dilatation. Pancreas: No ductal dilatation or inflammation. Spleen: Normal in size without focal abnormality. Adrenals/Urinary Tract: No adrenal nodule. Prior left nephrectomy. Moderate right hydronephrosis and proximal hydroureter, not significantly changed from prior exams. More distal ureter is nondistended. Urinary bladder is partially distended. Posterior bladder wall thickening more prominent on the left. Stomach/Bowel: Gastrostomy tube in the stomach. Prior small bowel dilatation has resolved. Enteric sutures noted in small bowel in the lower pelvis. No bowel obstruction with enteric contrast reaching the transverse colon and left mid abdominal colostomy. Stabled off transverse and descending colon are decompressed. Irregular enhancing rectal mass extending into the perirectal soft tissues and uterus, direct comparison difficult given lack of enhancement on prior exam. No peristomal hernia. Vascular/Lymphatic: Abdominal aorta is normal in caliber. Portal, splenic, and mesenteric veins are patent. No acute vascular findings. No definite enlarged abdominal or pelvic lymph nodes. Small retroperitoneal nodes are similar to prior exam. Reproductive: Rectal mass involves  the uterus which is heterogeneous, enlarged, with ill-defined borders inferiorly. Other: Moderate volume intra-abdominal ascites which measures simple fluid density, however slight peritoneal enhancement is noted. Loculated pelvic fluid appears similar to prior exams. Generalized body wall edema and skin thickening. No free air in the abdomen. Midline skin staples noted. Musculoskeletal: Possible vague lucent lesion within L1 and L3 vertebral bodies, not definitively seen on prior exams.  No sclerotic lesions. IMPRESSION: 1. Recent small bowel resection and transverse colostomy. Moderate volume of intra-abdominal ascites with slight peritoneal enhancement, but no well-defined abscess. No free air. 2. Moderate bilateral pleural effusions and bibasilar airspace disease. On the right this is consistent with compressive atelectasis, on the left may be atelectasis or pneumonia in the setting of leukocytosis. Whole body wall edema and anasarca. 3. Enhancing rectal mass invading the uterus and possibly posterior wall of the bladder. 4. Stable right hydronephrosis and proximal hydroureter. 5. Possible lucent lesions within L1 and L3 vertebral bodies out or not seen on 07/10/2017 CT, and may represent osseous metastatic disease. This could be further assessed with nonemergent bone scan or PET-CT. Electronically Signed   By: Jeb Levering M.D.   On: 11/17/2017 00:19   US Paracentesis  Result Date: 11/17/2017 INDICATION: Patient with history of stage IV lobular breast cancer, recent small bowel resection/transverse colostomy, ascites, bilateral pleural effusions, rectal mass. Request made for diagnostic and therapeutic paracentesis. EXAM: ULTRASOUND GUIDED DIAGNOSTIC AND THERAPEUTIC PARACENTESIS MEDICATIONS: None COMPLICATIONS: None immediate. PROCEDURE: Informed written consent was obtained from the patient after a discussion of the risks, benefits and alternatives to treatment. A timeout was performed prior to the  initiation of the procedure. Initial ultrasound scanning demonstrates a small to moderate amount of loculated ascites within the right lower abdominal quadrant. The right lower abdomen was prepped and draped in the usual sterile fashion. 1% lidocaine was used for local anesthesia. Following this, a 6 Fr Safe-T-Centesis catheter was introduced. An ultrasound image was saved for documentation purposes. The paracentesis was performed. The catheter was removed and a dressing was applied. The patient tolerated the procedure well without immediate post procedural complication. FINDINGS: A total of approximately 570 cc of hazy, amber fluid was removed. Samples were sent to the laboratory as requested by the clinical team. Due to the multiloculated nature of the ascites only the above amount of fluid could be removed today. IMPRESSION: Successful ultrasound-guided diagnostic and therapeutic paracentesis yielding 570 cc of peritoneal fluid. Read by: Rowe Robert, PA-C Electronically Signed   By: Sandi Mariscal M.D.   On: 11/17/2017 16:31   Dg Chest Port 1 View  Result Date: 11/20/2017 CLINICAL DATA:  Shortness of breath. EXAM: PORTABLE CHEST 1 VIEW COMPARISON:  Single-view of the chest 11/19/2017, 11/18/2017 and 11/11/2017. FINDINGS: Left greater than right pleural effusions and basilar atelectasis appear unchanged since the most recent examination. No pneumothorax. Heart size is normal. Right PICC remains in place. Surgical clips left axilla noted. IMPRESSION: No change in left greater than right pleural effusions and basilar atelectasis since the most recent study. Electronically Signed   By: Inge Rise M.D.   On: 11/20/2017 08:46   Dg Chest Port 1 View  Result Date: 11/19/2017 CLINICAL DATA:  Dyspnea EXAM: PORTABLE CHEST 1 VIEW COMPARISON:  Chest radiograph from one day prior. FINDINGS: Right PICC terminates in lower third of the SVC. Surgical clips overlie the left chest wall. Stable cardiomediastinal  silhouette with normal heart size. No pneumothorax. Small bilateral pleural effusions are probably stable accounting for differences in positioning. No pulmonary edema. Left basilar atelectasis is stable. IMPRESSION: 1. Small bilateral pleural effusions, probably stable accounting for differences in positioning. 2. Left basilar atelectasis, stable. Electronically Signed   By: Ilona Sorrel M.D.   On: 11/19/2017 08:00   Dg Chest Port 1 View  Result Date: 11/16/2017 CLINICAL DATA:  Leukocytosis. EXAM: PORTABLE CHEST 1 VIEW COMPARISON:  Chest radiograph 11/11/2004, lung bases from  abdominal CT 11/03/2017 FINDINGS: Left pleural effusion and basilar opacity, unchanged from prior exam. Small right pleural effusion has improved from prior exam. Mild residual right basilar atelectasis. Heart is normal in size. No new airspace disease. Tip of the right upper extremity PICC in the SVC. Left mastectomy with surgical clips in the chest wall. IMPRESSION: Left pleural effusion and basilar opacity are unchanged from chest radiograph 5 days ago. Basilar opacity likely secondary to atelectasis, sterility technically indeterminate by radiograph. Improved right pleural effusion and atelectasis from prior exam. No new airspace disease. Electronically Signed   By: Jeb Levering M.D.   On: 11/16/2017 20:42   Dg Chest Port 1 View  Result Date: 11/11/2017 CLINICAL DATA:  Acute kidney injury.  Chest pain. EXAM: PORTABLE CHEST 1 VIEW COMPARISON:  10/31/2016 FINDINGS: Right arm PICC tip in the SVC 4 cm above the right atrium. Bilateral pleural effusions with dependent atelectasis. Upper lungs are clear. Previous mastectomy on the left. No acute bone finding. IMPRESSION: Bilateral effusions with dependent pulmonary atelectasis. Electronically Signed   By: Nelson Chimes M.D.   On: 11/11/2017 15:17   Dg Abd 2 Views  Result Date: 11/07/2017 CLINICAL DATA:  62 year old female with small bowel obstruction. Recent colonic surgery.  Subsequent encounter. EXAM: ABDOMEN - 2 VIEW COMPARISON:  11/05/2017 plain film examination.  11/03/2017 CT. FINDINGS: Gas and fluid distended dilated small bowel loops more prominent than on the recent plain film examination with small bowel measuring up to 4.7 cm versus prior 4.5 cm. Small bowel fold thickening. Air-fluid levels. Interval development of gas filled stomach. Minimal residual contrast within colon. Colostomy left lower quadrant. No free intraperitoneal air noted. IMPRESSION: Progressive partial small bowel obstructive pattern. Interval development of gas-filled stomach. No free intraperitoneal air. These results will be called to the ordering clinician or representative by the Radiologist Assistant, and communication documented in the PACS or zVision Dashboard. Electronically Signed   By: Genia Del M.D.   On: 11/07/2017 09:05   Dg Abd 2 Views  Result Date: 11/05/2017 CLINICAL DATA:  Per order- small bowel obstruction PT HX: GERD, bladder sugery EXAM: ABDOMEN - 2 VIEW COMPARISON:  CT 11/03/2017 and previous FINDINGS: Visualized lung bases clear.  No free air. Multiple dilated small bowel loops with fluid levels on the erect radiograph. Similar number of involved loops and degree of dilatation since previous radiograph 11/02/2017. There has been progression of oral contrast material into the proximal colon which is nondilated. There is a left lower quadrant ostomy device. No abnormal abdominal calcifications.  Regional bones unremarkable. IMPRESSION: 1. Findings consistent with persistent partial mid/distal small bowel obstruction. 2. No free air. Electronically Signed   By: Lucrezia Europe M.D.   On: 11/05/2017 12:23   US Thoracentesis Asp Pleural Space W/img Guide  Result Date: 11/20/2017 INDICATION: Patient with history of stage IV lobular breast cancer, recent small bowel resection/transverse colostomy, ascites, bilateral pleural effusions, rectal mass. Request made for diagnostic and  therapeutic left thoracentesis. EXAM: ULTRASOUND GUIDED DIAGNOSTIC AND THERAPEUTIC LEFT THORACENTESIS MEDICATIONS: None COMPLICATIONS: None immediate. PROCEDURE: An ultrasound guided thoracentesis was thoroughly discussed with the patient and questions answered. The benefits, risks, alternatives and complications were also discussed. The patient understands and wishes to proceed with the procedure. Written consent was obtained. Ultrasound was performed to localize and mark an adequate pocket of fluid in the left chest. The area was then prepped and draped in the normal sterile fashion. 1% Lidocaine was used for local anesthesia. Under ultrasound guidance a  6 Fr Safe-T-Centesis catheter was introduced. Thoracentesis was performed. The catheter was removed and a dressing applied. FINDINGS: A total of approximately 170 cc of yellow fluid was removed. Samples were sent to the laboratory as requested by the clinical team. IMPRESSION: Successful ultrasound guided diagnostic and therapeutic left thoracentesis yielding 170 cc of pleural fluid. Read by: Rowe Robert, PA-C Electronically Signed   By: Markus Daft M.D.   On: 11/20/2017 11:28   US Thoracentesis Asp Pleural Space W/img Guide  Result Date: 11/18/2017 INDICATION: Pleural effusions, metastatic breast cancer EXAM: ULTRASOUND GUIDED RIGHT THORACENTESIS MEDICATIONS: 1% lidocaine local COMPLICATIONS: None immediate. PROCEDURE: An ultrasound guided thoracentesis was thoroughly discussed with the patient and questions answered. The benefits, risks, alternatives and complications were also discussed. The patient understands and wishes to proceed with the procedure. Written consent was obtained. Ultrasound was performed to localize and mark an adequate pocket of fluid in the right chest. The area was then prepped and draped in the normal sterile fashion. 1% Lidocaine was used for local anesthesia. Under ultrasound guidance a 6 Fr Safe-T-Centesis catheter was introduced.  Thoracentesis was performed. The catheter was removed and a dressing applied. FINDINGS: A total of approximately 230 cc of serosanguineous pleural fluid was removed. Samples were sent to the laboratory as requested by the clinical team. IMPRESSION: Successful ultrasound guided right thoracentesis yielding 230 cc of pleural fluid. Electronically Signed   By: Jerilynn Mages.  Shick M.D.   On: 11/18/2017 13:44

## 2017-12-08 ENCOUNTER — Other Ambulatory Visit: Payer: 59

## 2017-12-12 ENCOUNTER — Telehealth: Payer: Self-pay

## 2017-12-12 NOTE — Telephone Encounter (Signed)
Patient left voicemail regarding wanting to see about get PICC line removed after labs on 12/19/2017.  Dr. Hayden Rasmussen recommends keeping PICC line in place, unless the patient is too uncomfortable with PICC.   Returned patient's call to discuss, voicemail box full.  Unable to leave message.

## 2017-12-18 NOTE — Progress Notes (Signed)
[place]  [phone] [phone]     ID: Sonia Hill   DOB: 1955/10/06  MR#: [id number]  ULA#:[id number]  Patient Care Team: [name], MD as PCP - General (Family Medicine) Latisha Lasch, Virgie Dad, MD as Consulting Physician (Oncology) Molli Posey, MD as Consulting Physician (Obstetrics and Gynecology) Laurence Spates, MD as Consulting Physician (Gastroenterology) Leighton Ruff, MD as Consulting Physician (General Surgery) Delrae Rend, MD as Consulting Physician (Endocrinology) Clarene Essex, MD as Consulting Physician (Gastroenterology) Donato Heinz, MD as Consulting Physician (Nephrology)   CHIEF COMPLAINT: Metastatic breast cancer, estrogen receptor positive  CURRENT TREATMENT: Exemestane, abemaciclib; [refuses bisphosphonates or denosumab; also refuses chemo and radiation options]  INTERVAL HISTORY: Sonia Hill  returns today for follow-up and treatment of her 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 She is accompanied by her husband.   The patient continues on exemestane, with excellent tolerance.  She is also now doing much better on abemaciclib, which she is taking at 100 mg twice daily.  Specifically she is not having any rash, diarrhea, unusual fatigue, or other side effects that she is aware of.  Since her last visit here, she underwent a CT abdomen pelvis with contrast on 12/01/2017, showing no evidence of bowel obstruction or bowel complication following surgery. gastrostomy tube in proper position. Ostomy appears normal without evidence of complication or infection. Moderate to large volume intraperitoneal free fluid appears to be freely communicate throughout the peritoneal space. However, there is a thin enhancing peritoneal surface throughout the abdomen and pelvis associated with the visceral and parietal peritoneal surface consistent with peritonitis. Bilateral pleural effusions and anasarca of the soft  tissues consistent with volume overload/hypo of anemia. Bibasilar passive atelectasis the lungs. 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 Mass extends to the perineum.   REVIEW OF SYSTEMS: Sonia Hill is eating many small meals a day.  She is pretty much maintaining her weight except she has lost a lot of water weight because her legs are no longer massively edematous as they were previously.  She has no nausea or vomiting, no mouth sores, and she has not needed to use her PEG tube at all.  She flushes it once a day.  She has a right-sided PICC line which is currently not being used although it is being flushed regularly.  Her colostomy is working fine, with normal soft stool in the bag.  A detailed review of systems today was otherwise stable  BREAST CANCER HISTORY:  From the initial intake note:  Sonia Hill had screening mammography on 11/10/2009 showing very dense tissue with a possible distortion in the left breast.  Digital mammography on June 10th showed a 1.5 cm cluster of microcalcifications in the lower inner portion of the breast.  On physical exam, there was no palpable mass and no palpable axillary adenopathy.  There was some nipple inversion inferolaterally.  Ultrasound showed a 2.6 cm irregular mass-like area with dense posterior acoustic shadowing in the 3 o'clock position, 1 cm from the nipple, but normal appearing left axillary lymph nodes.    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  A second mass noted on ultrasound was also lobular, also low grade.  Both were ER and PR positive at well over 90%.  Both had low proliferation markers at 9%, and both were Her-2 negative by CISH with ratios of 1.6  and 1.24.  In short, both masses were nearly identical.    On June 16th, the patient had bilateral breast MRIs  which showed in the left breast at 3 o'clock an irregular area of abnormal enhancement measuring up to 2.7 cm and in the left lower inner quadrant, a second post-biopsy area measuring 1.5 cm.  The difference between these two areas was stated at the conference the morning of the visit to be approximately 5 cm.  Given this data the patient opted for Left mastectomy with sentinel lymph node sampling, performed July 2011 with results as detailed below.  Subsequent history is as detailed below.    PAST MEDICAL HISTORY: Past Medical History:  Diagnosis Date  . Arthritis    neck, upper back, shoulders - no meds - yoga  . Chronic kidney disease    Only has right kidney  . Depression    Hx - no current problem  . GERD (gastroesophageal reflux disease)    diet controlled - no meds  . Hyperlipidemia    diet controlled  . Hypothyroidism   . lt breast ca dx'd 11/2009  . Osteoporosis, unspecified 12/06/2013  . PONV (postoperative nausea and vomiting)   . SBO (small bowel obstruction) ([place])   . SVD (spontaneous vaginal delivery)    x 1  1. Osteopenia. 2. Congenital left renal atrophy.  3. History of recurrent UTIs. 4. History of bladder reconstruction for reflux more than 18 years ago. 5. History of depression and anxiety. 6. History of hypothyroidism.  7. History of "sling procedure".  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    FAMILY HISTORY The patient's father is alive in his early 40s.  He has Alzheimer's disease. The patient's mother was diagnosed with breast cancer at the age of 14.  She died at the age of 57.  The patient's father had one out of two sisters with breast cancer.  There are other breast cancers on the mother's side.  The patient has one brother and one younger sister, neither with cancer.    GYNECOLOGIC HISTORY:  (Reviewed 12/06/2013) She is GX P1.  That pregnancy was at age 62.  Menarche was at age 2.   She was perimenopausal at the time of diagnosis but is now convincingly postmenopausal  SOCIAL HISTORY:  (Reviewed 12/06/2013) She works as a Electrical engineer, part-time and on consultation.  Her husband, Sonia Hill, is a Architect at [employer].  Sister Sonia Hill works as a Pharmacist, hospital in [place].  The patient's son Sonia Hill is  in college.  The patient attends [employer].   ADVANCED DIRECTIVES: in place  HEALTH MAINTENANCE: (Updated 12/06/2013)  Social History   Tobacco Use  . Smoking status: Never Smoker  . Smokeless tobacco: Never Used  Substance Use Topics  . Alcohol use: No  . Drug use: No     Colonoscopy: 06/18/2015; [facility]  PAP: UTD/May 2015, Dr. Matthew Saras  Bone density: Sonia Hill 2014, osteoporosis  Lipid panel: UTD (Not on file)    Allergies  Allergen Reactions  . Sulfa Antibiotics Rash  . Adhesive [Tape] Rash    Ok to use paper tape and tegaderm over IV site  . Betadine [Povidone Iodine] Rash  . Mercury Rash    Reaction mercurachrome    Current Outpatient Medications  Medication Sig Dispense Refill  . abemaciclib (VERZENIO) 100 MG tablet Take 1 tablet (100 mg total) by mouth 2 (two) times daily. Swallow tablets  whole. Do not chew, crush, or split tablets before swallowing. 56 tablet 5  . ALPRAZolam (XANAX) 0.25 MG tablet TAKE 1 TABLET BY MOUTH EVERY 8 HOURS AS NEEDED FOR ANXIETY 15 tablet 0  . cholecalciferol (D-VI-SOL) 400 UNIT/ML LIQD Take 400 Units by mouth daily.    Marland Kitchen exemestane (AROMASIN) 25 MG tablet Take 1 tablet (25 mg total) by mouth daily after breakfast. 90 tablet 4  . furosemide (LASIX) 40 MG tablet Take 1 tablet (40 mg total) by mouth daily. 30 tablet 0  . gabapentin (NEURONTIN) 300 MG capsule Take 1 capsule (300 mg total) by mouth 3 (three) times daily as needed. (Patient taking differently: Take 300 mg by mouth 3 (three) times daily as needed (nerve pain). ) 90 capsule 1  . heparin lock flush 100 UNIT/ML SOLN injection Inject 2.5 mLs (250  Units total) into the vein daily for 30 doses. 75 mL 0  . ondansetron (ZOFRAN-ODT) 8 MG disintegrating tablet Take 1 tablet (8 mg total) by mouth every 8 (eight) hours as needed for nausea or vomiting. 40 tablet 1  . polyethylene glycol (MIRALAX / GLYCOLAX) packet Take 17 g by mouth daily.    . simethicone (MYLICON) 824 MG chewable tablet Chew 125 mg by mouth every 2 (two) hours as needed for flatulence.     . Thyroid (NATURE-THROID) 81.25 MG TABS Take 81.25 mg by mouth daily.     No current facility-administered medications for this visit.     OBJECTIVE: Middle aged white woman who appears stated age  62:   12/19/17 1218 12/19/17 1235  BP: (!) 192/95 (!) 145/74  Pulse: 97   Resp: 18   Temp: 98.4 F (36.9 C)   SpO2: 100%      Body mass index is 20.68 kg/m.    ECOG FS: 2 Filed Weights   12/19/17 1218  Weight: 105 lb 14.4 oz (48 kg)    Sclerae unicteric, EOMs intact Oropharynx clear and moist No cervical or supraclavicular adenopathy Lungs no rales or rhonchi Heart regular rate and rhythm Abd soft, nontender, positive bowel sounds, not increased, PEG in place, insertion site clear; colostomy in place with some material in bag; right upper quadrant wound bandaged over, healing by secondary intention. MSK bilateral lower extremity lymphedema considerably improved Neuro: nonfocal, well oriented, appropriate affect Breasts: Right breast unremarkable.  Left breast status post mastectomy.  There is no evidence of local recurrence.  Both axillae are benign.  LAB RESULTS:   No results found for: CA125 Lab Results  Component Value Date   LABCA2 181 (H) 10/23/2015  No results found for: CEA    Lab Results  Component Value Date   WBC 5.7 12/19/2017   NEUTROABS 4.4 12/19/2017   HGB 8.9 (L) 12/19/2017   HCT 26.2 (L) 12/19/2017   MCV 89.7 12/19/2017   PLT 392 12/19/2017      Chemistry      Component Value Date/Time   NA 135 11/28/2017 1421   NA 134 (L) 12/28/2016 1503     K 3.1 (L) 11/28/2017 1421   K 4.0 12/28/2016 1503   CL 97 (L) 11/28/2017 1421   CL 98 12/03/2012 1419   CO2 26 11/28/2017 1421   CO2 25 12/28/2016 1503   BUN 21 (H) 11/28/2017 1421   BUN 12.0 12/28/2016 1503   CREATININE 0.67 11/28/2017 1421   CREATININE 0.81 09/13/2017 1157   CREATININE 0.8 12/28/2016 1503      Component Value Date/Time   CALCIUM 9.3 11/28/2017  1421   CALCIUM 9.7 12/28/2016 1503   ALKPHOS 247 (H) 11/22/2017 0818   ALKPHOS 76 12/28/2016 1503   AST 39 11/22/2017 0818   AST 25 09/13/2017 1157   AST 22 12/28/2016 1503   ALT 34 11/22/2017 0818   ALT 12 09/13/2017 1157   ALT 20 12/28/2016 1503   BILITOT 0.7 11/22/2017 0818   BILITOT 0.6 09/13/2017 1157   BILITOT 0.50 12/28/2016 1503      STUDIES: Dg Chest 1 View  Result Date: 11/20/2017 CLINICAL DATA:  Right thoracentesis. EXAM: CHEST  1 VIEW COMPARISON:  11/16/2017, 11/20/2017 FINDINGS: Small left pleural effusion. No significant right pleural effusion. No pneumothorax. No focal consolidation. Stable cardiomediastinal silhouette. Right-sided PICC line with the tip projecting over the SVC. No acute osseous abnormality. IMPRESSION: 1. No right pneumothorax status post thoracentesis. Electronically Signed   By: Kathreen Devoid   On: 11/20/2017 11:54   Dg Chest 2 View  Result Date: 11/28/2017 CLINICAL DATA:  High blood pressure. Shortness of breath. Chest pain. EXAM: CHEST - 2 VIEW COMPARISON:  11/20/2017. FINDINGS: Right PICC line noted with tip over superior vena cava. Heart size normal. Bibasilar atelectasis/infiltrates. Small bilateral pleural effusions noted on today's exam. No pneumothorax. Gastrostomy tube noted over the left upper quadrant in stable position. IMPRESSION: 1. Right PICC line noted with tip over superior vena cava in stable position. 2. Bibasilar atelectasis. Small bilateral pleural effusions noted on today's exam. Electronically Signed   By: Marcello Moores  Register   On: 11/28/2017 14:15   Ct Abdomen  Pelvis W Contrast  Result Date: 12/01/2017 CLINICAL DATA:  Patient lobular carcinoma of the breast with metastatic carcinomatosis to the abdomen pelvis. Patient status post multiple bowel surgeries for carcinomatosis related bowel obstruction. Patient most recent surgery 11/08/2017 with new ostomy. Remote history of LEFT nephrectomy. EXAM: CT ABDOMEN AND PELVIS WITH CONTRAST TECHNIQUE: Multidetector CT imaging of the abdomen and pelvis was performed using the standard protocol following bolus administration of intravenous contrast. CONTRAST:  182m OMNIPAQUE IOHEXOL 300 MG/ML  SOLN COMPARISON:  CT 11/16/2017 FINDINGS: Lower chest: Moderate bilateral pleural effusions with bibasilar atelectasis. Hepatobiliary: No focal hepatic lesion. No biliary duct dilatation. Gallbladder is normal. Common bile duct is normal. Pancreas: Pancreas is normal. No ductal dilatation. No pancreatic inflammation. Spleen: Normal spleen Adrenals/urinary tract: Adrenal glands are normal. Hydronephrosis and hydroureter of the solitary RIGHT kidney not changed significantly from comparison exams. Stomach/Bowel: Percutaneous gastrostomy tube in the stomach. The duodenum and small bowel grossly normal. There is enteric colonic anastomosis. No small bowel dilatation or inflammation identified. No pneumatosis. The colon is nonobstructed. LEFT abdominal wall loop ostomy noted. No obstruction. No peristomal hernia. No subcutaneous infection associated with the colostomy. Vascular/Lymphatic: Abdominal aorta is normal caliber. No periportal or retroperitoneal adenopathy. No pelvic adenopathy. Reproductive: Post hysterectomy. Other: There is a large heterogeneous peripherally enhancing mass in the posterior cul-de-sac which extends out of the pelvis consistent with masslike carcinomatosis. Mass measures 8.6 by 6.0 cm (image 64/13). This is similar to 9.1 by 5.1 cm. Superior portion the mass measures 5 cm on image 58/13 not changed. Carcinomatosis  extends the pelvic floor and perineum (image 78/13). There is a large volume intraperitoneal free fluid. Along the visceral and parietal peritoneal surfaces there is a thin enhancing rim. This is seen in the LEFT lower quadrant on image 53/13 and along the RIGHT pericolic gutter on image 492/42 Findings are consistent with peritonitis. There is no organized fluid collections to suggest abscess. The intraperitoneal free fluid  appears freely communicate throughout the abdomen. Musculoskeletal: No aggressive osseous lesion. Anasarca of the soft tissues. IMPRESSION: 1. No evidence of bowel obstruction or bowel complication following surgery. gastrostomy tube in proper position. Ostomy appears normal without evidence of complication or infection. 2. Moderate to large volume intraperitoneal free fluid appears to be freely communicate throughout the peritoneal space. However, there is a thin enhancing peritoneal surface throughout the abdomen and pelvis associated with the visceral and parietal peritoneal surface consistent with peritonitis. 3. Bilateral pleural effusions and anasarca of the soft tissues consistent with volume overload/hypo of anemia. 4. Bibasilar passive atelectasis the lungs. 5. 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 Mass extends to the perineum. Findings conveyed toDr.  Alta Corning 12/01/2017  at17:38. Electronically Signed   By: Suzy Bouchard M.D.   On: 12/01/2017 17:43   Dg Chest Port 1 View  Result Date: 11/20/2017 CLINICAL DATA:  Shortness of breath. EXAM: PORTABLE CHEST 1 VIEW COMPARISON:  Single-view of the chest 11/19/2017, 11/18/2017 and 11/11/2017. FINDINGS: Left greater than right pleural effusions and basilar atelectasis appear unchanged since the most recent examination. No pneumothorax. Heart size is normal. Right PICC remains in place. Surgical clips left axilla noted. IMPRESSION: No change in left greater than right pleural effusions and basilar  atelectasis since the most recent study. Electronically Signed   By: Inge Rise M.D.   On: 11/20/2017 08:46   US Thoracentesis Asp Pleural Space W/img Guide  Result Date: 11/20/2017 INDICATION: Patient with history of stage IV lobular breast cancer, recent small bowel resection/transverse colostomy, ascites, bilateral pleural effusions, rectal mass. Request made for diagnostic and therapeutic left thoracentesis. EXAM: ULTRASOUND GUIDED DIAGNOSTIC AND THERAPEUTIC LEFT THORACENTESIS MEDICATIONS: None COMPLICATIONS: None immediate. PROCEDURE: An ultrasound guided thoracentesis was thoroughly discussed with the patient and questions answered. The benefits, risks, alternatives and complications were also discussed. The patient understands and wishes to proceed with the procedure. Written consent was obtained. Ultrasound was performed to localize and mark an adequate pocket of fluid in the left chest. The area was then prepped and draped in the normal sterile fashion. 1% Lidocaine was used for local anesthesia. Under ultrasound guidance a 6 Fr Safe-T-Centesis catheter was introduced. Thoracentesis was performed. The catheter was removed and a dressing applied. FINDINGS: A total of approximately 170 cc of yellow fluid was removed. Samples were sent to the laboratory as requested by the clinical team. IMPRESSION: Successful ultrasound guided diagnostic and therapeutic left thoracentesis yielding 170 cc of pleural fluid. Read by: Rowe Robert, PA-C Electronically Signed   By: Markus Daft M.D.   On: 11/20/2017 11:28     ASSESSMENT: 62 y.o.  BRCA 1-2 negative [facility] woman with lobular breast cancer stage IV at presentation July 2011  (1) status post left mastectomy and sentinel lymph node sampling in July 2011 for a lower inner quadrant T1 N1 M1, stage IV invasive lobular breast cancer, grade 1, strongly estrogen and progesterone receptor-positive, HER2 negative with MIB-1 of 9% and no HER2 amplification,    (2) with multiple sclerotic bone lesions at presentation seen only on CT scan (not on bone scan or PET scan), but  with biopsy-proven metastatic disease to bone; and an elevated CA 27.29 at presentation,   (3) Oncotype recurrence score of 4, predicts a good response to antiestrogens.  (4) Systemic treatment has consisted of  a) tamoxifen with evidence of response but poor tolerance  b) letrozole starting August 2012, discontinued October 2014 per patient   (5) single  functioning kidney  (6) status post bilateral salpingo-oophorectomy 01/24/2013, with benign pathology  (7) osteoporosis; the patient refuses bisphosphonate therapy; started [facility] February 2014.    (a) bone density was obtained under the care of Dr. Matthew Saras at [facility], 06/25/2012, showing osteoporosis with a T score -2.6. This was repeated 12/20/2013, showing again osteoporosis with T-scores between -2.4 and -2.8.  (b) the patient refuses zolendronate, denosumab, or other pharmacologic intervention  (c) bone density on 12/30/2015 under Dr. Matthew Saras showing osteoporosis with T scores between -2.7 and -3.3  (8) the patient refuses standard Mammography or tomography; undergoing thermography screening of the right breast.  (9) PET scan 06/05/2015 shows rectal thickening and a presacral mass; biopsy of this area 10/09/2015 confirms metastatic lobular breast cancer, again estrogen receptor positive, HER-2 nonamplified.  (a) pelvic MRI 03/11/2016 confirms stability of disease  (b) chest CT and pelvic MRI 09/02/2016 shows no change in the circumferential rectal thickening or evidence of extension beyond the serosa and chest CT findings  (c) colonoscopy 02/23/2017 showed a very narrow rectal lumen with a few apparently uninvolved centimeters distally   (d) CT of the abdomen and pelvis 05/22/2017 (after diverting colostomy) shows interval increase in the size of the perirectal soft tissue masses.  (10)  started fulvestrant and palbociclib 125 mg/ day [21/7] May 2017  (a) palbociclib dose decreased to 100 mg daily [21/7] with second cycle, started 11/25/2015  (b) palbociclib discontinued 03/20/2017 with evidence of disease progression  (c) last fulvestrant dose 03/24/2017, discontinued with evidence of progression  (11) status post colostomy placement at [facility] November 2018  (12) started exemestane 07/05/2017,  (a) everolimus 5 mg/d started 07/14/2017--held as of 08/20/2017 secondary to rash  (b) exemestane stopped by patient 09/04/2017 to try "the natural way".  13) exemestane resumed 10/16/2017             (a) abemaciclib/Verzenio added 11/29/2017  (14) s/p laparoscopic small bowel resection for SBO 11/08/2017             (a) peritoneal fluid 11/17/2017 growing enterococcus, vanc sensitive  (b) completed antibiotics 11/27/2017    PLAN:  Orie benefited greatly from Dr. gross cysts and the surgical teams interventions and she is now able to eat pretty much normal diet.  She is maintaining her weight.  The colostomy is working fine.  This is all extremely favorable.  She is tolerating the exemestane and abemaciclib well and her CA-27-29 had dropped by 50% when we most recently checked it.  Additional labs are pending today.  She is planning a trip to [place] between 0805 and 08 18.  I wrote her a letter today so that she would be able to take her protein shakes with her and also so she could be moved through the handicap system in the airport.  The letter also allows her to keep her colostomy belt in place.  I wrote a note so that her right PICC line may be removed  At this point I am encouraged that she is doing much better and we are continuing the abemaciclib and exemestane until there is evidence of disease progression.  She will see [place] again 01/30/2018.  She knows to call before that date if any problems develop  Devanee Pomplun, Virgie Dad, MD  12/19/17 12:43  PM Medical Oncology and Hematology [facility] [address] [place], [place] [address] Tel. [phone]    Fax. [phone]  I, Margit Banda am acting as a scribe  for Chauncey Cruel, MD.   I, Lurline Del MD, have reviewed the above documentation for accuracy and completeness, and I agree with the above.

## 2017-12-19 ENCOUNTER — Telehealth: Payer: Self-pay | Admitting: Oncology

## 2017-12-19 ENCOUNTER — Ambulatory Visit: Payer: 59 | Admitting: Oncology

## 2017-12-19 ENCOUNTER — Inpatient Hospital Stay (HOSPITAL_BASED_OUTPATIENT_CLINIC_OR_DEPARTMENT_OTHER): Payer: 59 | Admitting: Oncology

## 2017-12-19 ENCOUNTER — Inpatient Hospital Stay: Payer: 59 | Attending: Oncology

## 2017-12-19 ENCOUNTER — Encounter: Payer: Self-pay | Admitting: Oncology

## 2017-12-19 VITALS — BP 145/74 | HR 97 | Temp 98.4°F | Resp 18 | Ht 60.0 in | Wt 105.9 lb

## 2017-12-19 DIAGNOSIS — F329 Major depressive disorder, single episode, unspecified: Secondary | ICD-10-CM | POA: Diagnosis not present

## 2017-12-19 DIAGNOSIS — E039 Hypothyroidism, unspecified: Secondary | ICD-10-CM | POA: Diagnosis not present

## 2017-12-19 DIAGNOSIS — Z90722 Acquired absence of ovaries, bilateral: Secondary | ICD-10-CM | POA: Insufficient documentation

## 2017-12-19 DIAGNOSIS — C50312 Malignant neoplasm of lower-inner quadrant of left female breast: Secondary | ICD-10-CM | POA: Insufficient documentation

## 2017-12-19 DIAGNOSIS — C50919 Malignant neoplasm of unspecified site of unspecified female breast: Secondary | ICD-10-CM

## 2017-12-19 DIAGNOSIS — M129 Arthropathy, unspecified: Secondary | ICD-10-CM

## 2017-12-19 DIAGNOSIS — Z803 Family history of malignant neoplasm of breast: Secondary | ICD-10-CM | POA: Insufficient documentation

## 2017-12-19 DIAGNOSIS — Z79899 Other long term (current) drug therapy: Secondary | ICD-10-CM | POA: Diagnosis not present

## 2017-12-19 DIAGNOSIS — E785 Hyperlipidemia, unspecified: Secondary | ICD-10-CM | POA: Insufficient documentation

## 2017-12-19 DIAGNOSIS — J90 Pleural effusion, not elsewhere classified: Secondary | ICD-10-CM

## 2017-12-19 DIAGNOSIS — K219 Gastro-esophageal reflux disease without esophagitis: Secondary | ICD-10-CM

## 2017-12-19 DIAGNOSIS — Z933 Colostomy status: Secondary | ICD-10-CM | POA: Insufficient documentation

## 2017-12-19 DIAGNOSIS — F419 Anxiety disorder, unspecified: Secondary | ICD-10-CM

## 2017-12-19 DIAGNOSIS — Z9012 Acquired absence of left breast and nipple: Secondary | ICD-10-CM | POA: Insufficient documentation

## 2017-12-19 DIAGNOSIS — M810 Age-related osteoporosis without current pathological fracture: Secondary | ICD-10-CM

## 2017-12-19 DIAGNOSIS — M818 Other osteoporosis without current pathological fracture: Secondary | ICD-10-CM

## 2017-12-19 DIAGNOSIS — Z170 Estrogen receptor positive status [ER+]: Secondary | ICD-10-CM | POA: Insufficient documentation

## 2017-12-19 DIAGNOSIS — C50912 Malignant neoplasm of unspecified site of left female breast: Secondary | ICD-10-CM

## 2017-12-19 DIAGNOSIS — E440 Moderate protein-calorie malnutrition: Secondary | ICD-10-CM

## 2017-12-19 DIAGNOSIS — C786 Secondary malignant neoplasm of retroperitoneum and peritoneum: Secondary | ICD-10-CM

## 2017-12-19 DIAGNOSIS — C785 Secondary malignant neoplasm of large intestine and rectum: Secondary | ICD-10-CM

## 2017-12-19 DIAGNOSIS — C801 Malignant (primary) neoplasm, unspecified: Secondary | ICD-10-CM

## 2017-12-19 DIAGNOSIS — C7951 Secondary malignant neoplasm of bone: Secondary | ICD-10-CM

## 2017-12-19 DIAGNOSIS — Z90710 Acquired absence of both cervix and uterus: Secondary | ICD-10-CM | POA: Insufficient documentation

## 2017-12-19 DIAGNOSIS — C784 Secondary malignant neoplasm of small intestine: Secondary | ICD-10-CM

## 2017-12-19 DIAGNOSIS — K56609 Unspecified intestinal obstruction, unspecified as to partial versus complete obstruction: Secondary | ICD-10-CM

## 2017-12-19 LAB — FUNGUS CULTURE WITH STAIN

## 2017-12-19 LAB — CBC WITH DIFFERENTIAL/PLATELET
BASOS ABS: 0.1 10*3/uL (ref 0.0–0.1)
Basophils Relative: 1 %
Eosinophils Absolute: 0.3 10*3/uL (ref 0.0–0.5)
Eosinophils Relative: 5 %
HEMATOCRIT: 26.2 % — AB (ref 34.8–46.6)
Hemoglobin: 8.9 g/dL — ABNORMAL LOW (ref 11.6–15.9)
LYMPHS PCT: 12 %
Lymphs Abs: 0.7 10*3/uL — ABNORMAL LOW (ref 0.9–3.3)
MCH: 30.5 pg (ref 25.1–34.0)
MCHC: 34 g/dL (ref 31.5–36.0)
MCV: 89.7 fL (ref 79.5–101.0)
Monocytes Absolute: 0.3 10*3/uL (ref 0.1–0.9)
Monocytes Relative: 5 %
NEUTROS ABS: 4.4 10*3/uL (ref 1.5–6.5)
NEUTROS PCT: 77 %
Platelets: 392 10*3/uL (ref 145–400)
RBC: 2.92 MIL/uL — AB (ref 3.70–5.45)
RDW: 17.8 % — ABNORMAL HIGH (ref 11.2–14.5)
WBC: 5.7 10*3/uL (ref 3.9–10.3)

## 2017-12-19 LAB — COMPREHENSIVE METABOLIC PANEL
ALBUMIN: 3.4 g/dL — AB (ref 3.5–5.0)
ALT: 10 U/L (ref 0–44)
AST: 22 U/L (ref 15–41)
Alkaline Phosphatase: 99 U/L (ref 38–126)
Anion gap: 10 (ref 5–15)
BILIRUBIN TOTAL: 0.3 mg/dL (ref 0.3–1.2)
BUN: 27 mg/dL — AB (ref 8–23)
CHLORIDE: 97 mmol/L — AB (ref 98–111)
CO2: 31 mmol/L (ref 22–32)
CREATININE: 1.33 mg/dL — AB (ref 0.44–1.00)
Calcium: 9.9 mg/dL (ref 8.9–10.3)
GFR calc Af Amer: 49 mL/min — ABNORMAL LOW (ref >60)
GFR, EST NON AFRICAN AMERICAN: 42 mL/min — AB (ref >60)
GLUCOSE: 108 mg/dL — AB (ref 70–99)
Potassium: 3.8 mmol/L (ref 3.5–5.1)
Sodium: 138 mmol/L (ref 135–145)
Total Protein: 7 g/dL (ref 6.5–8.1)

## 2017-12-19 LAB — FUNGAL ORGANISM REFLEX

## 2017-12-19 LAB — FUNGUS CULTURE RESULT

## 2017-12-19 NOTE — Telephone Encounter (Signed)
Gave patient avs and calendar of upcoming aug appts.  °

## 2017-12-20 ENCOUNTER — Telehealth: Payer: Self-pay

## 2017-12-20 LAB — URINE CULTURE

## 2017-12-20 LAB — CANCER ANTIGEN 27.29: CAN 27.29: 478.3 U/mL — AB (ref 0.0–38.6)

## 2017-12-20 NOTE — Telephone Encounter (Signed)
Morey Hummingbird, nurse at [facility], called to obtain verbal order to remove patient's PICC line.  Verbal order given per Dr. [name] to remove PICC line at today's home health visit.

## 2017-12-26 ENCOUNTER — Other Ambulatory Visit: Payer: Self-pay | Admitting: *Deleted

## 2017-12-26 MED ORDER — ALPRAZOLAM 0.25 MG PO TABS: 0.2500 mg | ORAL_TABLET | Freq: Three times a day (TID) | ORAL | 0 refills | Status: DC | PRN

## 2018-01-03 ENCOUNTER — Encounter: Payer: Self-pay | Admitting: Rehabilitation

## 2018-01-03 ENCOUNTER — Ambulatory Visit: Payer: 59 | Attending: Oncology | Admitting: Rehabilitation

## 2018-01-03 ENCOUNTER — Other Ambulatory Visit: Payer: Self-pay

## 2018-01-03 DIAGNOSIS — I890 Lymphedema, not elsewhere classified: Secondary | ICD-10-CM | POA: Diagnosis present

## 2018-01-03 DIAGNOSIS — M6281 Muscle weakness (generalized): Secondary | ICD-10-CM | POA: Diagnosis present

## 2018-01-03 NOTE — Patient Instructions (Signed)
Pt will call over to [facility] [address] in [place] to see if they have any thigh high off the shelf compression available for her trip to [place] in August.

## 2018-01-03 NOTE — Therapy (Signed)
[place], [place], [address] Phone: [phone]   Fax:  [phone]  Physical Therapy Evaluation  Patient Details  Name: Sonia Hill MRN: [id number] Date of Birth: 1956/05/09 Referring Provider: Dr. [name]   Encounter Date: 01/03/2018  PT End of Session - 01/03/18 1648    Visit Number  1    Number of Visits  8    Date for PT Re-Evaluation  01/31/18    PT Start Time  3149    PT Stop Time  1530    PT Time Calculation (min)  52 min    Activity Tolerance  Patient tolerated treatment well    Behavior During Therapy  [facility] for tasks assessed/performed       Past Medical History:  Diagnosis Date  . Arthritis    neck, upper back, shoulders - no meds - yoga  . Chronic kidney disease    Only has right kidney  . Depression    Hx - no current problem  . GERD (gastroesophageal reflux disease)    diet controlled - no meds  . Hyperlipidemia    diet controlled  . Hypothyroidism   . lt breast ca dx'd 11/2009  . Osteoporosis, unspecified 12/06/2013  . PONV (postoperative nausea and vomiting)   . SBO (small bowel obstruction) ([place])   . SVD (spontaneous vaginal delivery)    x 1    Past Surgical History:  Procedure Laterality Date  . ABDOMINAL SURGERY    . BLADDER NECK RECONSTRUCTION     made a new urethea tube  . bladder tact  2008  . BOWEL RESECTION N/A 11/08/2017   Procedure: SMALL BOWEL RESECTION;  Surgeon: Michael Boston, MD;  [facility]: [facility];  Service: General;  Laterality: N/A;  . BREAST SURGERY     masectomy left breast  . coloscopy    . COLOSTOMY    . DILATION AND CURETTAGE OF UTERUS    . GASTROSTOMY N/A 11/08/2017   Procedure: INSERTION OF GASTROSTOMY TUBE;  Surgeon: Michael Boston, MD;  [facility];  Service: General;  Laterality: N/A;  . LAPAROSCOPY N/A 01/24/2013   Procedure: LAPROSCOPY OPERATIVE;  Surgeon: Margarette Asal, MD;  Location: [place] [facility];  Service: Gynecology;  Laterality:  N/A;  . LAPAROSCOPY N/A 11/08/2017   Procedure: LAPAROSCOPY DIAGNOSTIC;  Surgeon: Michael Boston, MD;  [facility];  Service: General;  Laterality: N/A;  . LYSIS OF ADHESION N/A 11/08/2017   Procedure: LYSIS OF ADHESION;  Surgeon: Michael Boston, MD;  [facility];  Service: General;  Laterality: N/A;  . NEPHRECTOMY Left    left kidney dysfunction, recurrent pyelonephritis  . PARTIAL COLECTOMY N/A 11/08/2017   Procedure: PARTIAL COLECTOMY WITH NEW COLOSTOMY;  Surgeon: Michael Boston, MD;  [facility];  Service: General;  Laterality: N/A;  . SALPINGOOPHORECTOMY Bilateral 01/24/2013   Procedure: SALPINGO OOPHORECTOMY;  Surgeon: Margarette Asal, MD;  Location: [place] [facility];  Service: Gynecology;  Laterality: Bilateral;  . WISDOM TOOTH EXTRACTION      There were no vitals filed for this visit.   Subjective Assessment - 01/03/18 1442    Subjective  Had to stop in May due to blockage in the colon and had to have emergency surgery to repair small and large intestine.  The colostomy had to be removed and replaced due to cancer in it.  Hospitalized 21 days.   Now has no eating restrictions. had increased LE and abdominal swelling due to fluid accumulation (gained 30 pounds).  Was able to get most of it off.  The LEs seem a bit more swollen now. Now on verzinio and an aromatase inhibitor for the breast cancer.  Tumor markers are coming down.    Did have an episode of peritonitis and pleural effusion which she was on strong antibiotic for which has now stopped.  "Per pt the kidney is functioning a bit lower but they wanted me to keep doing therapy".  Currently doing [place] anti-embolism stockings thigh high (89mHg) bilaterally along with biker shorts.      Pertinent History  BRCA 1-2 negative with lobular breast cancer stage IV at presentation July 2011 status post left mastectomy and sentinel lymph node sampling in July 2011 for a lower inner quadrant T1 N1 M1, stage IV invasive lobular breast cancer,  grade 1, strongly estrogen and progesterone receptor-positive, HER2 negative with MIB-1 of 9% and no HER2 amplification, with multiple sclerotic bone lesions at presentation seen only on CT scan (not on bone scan or PET scan), but  with biopsy-proven metastatic disease to bone; and an elevated CA 27.29 at presentation, single functioning kidney, status post bilateral salpingo-oophorectomy 01/24/2013, with benign pathology osteoporosis; the patient refuses bisphosphonate therapy; [facility] T score -2.7 and -3., s/p colostomy, recent abdominal cellulits    Currently in Pain?  No/denies pain near the colostomy with bending over and movements         [facility]      Assessment   Medical Diagnosis  metatstatic left breast cancer    Referring Provider  Dr. MJana Hakim   Onset Date/Surgical Date  11/24/09    Hand Dominance  Right    Prior Therapy  none      Precautions   Precaution Comments  lymphedema, 1 kidney, osteoporosis, colostomy      Restrictions   Weight Bearing Restrictions  No      Balance Screen   Has the patient fallen in the past 6 months  No    Has the patient had a decrease in activity level because of a fear of falling?   No    Is the patient reluctant to leave their home because of a fear of falling?   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    Leisure  getting back to exercise      Cognition   Overall  Cognitive Status  Within Functional Limits for tasks assessed      Strength   Overall Strength  -- not tested due to time but needs help lifting the Lt leg        LYMPHEDEMA/ONCOLOGY QUESTIONNAIRE - 01/03/18 1504      Type   Cancer Type  metastatic left breast cancer      Surgeries   Mastectomy Date  12/15/09    Sentinel Lymph Node Biopsy Date  12/15/09    Number Lymph Nodes Removed  1 positive      Treatment   Active Chemotherapy Treatment  No    Past Chemotherapy Treatment  No    Active Radiation Treatment  No    Past Radiation Treatment  No    Current Hormone Treatment  No    Past Hormone Therapy  Yes      What other symptoms do you have   Are you Having Heaviness or Tightness  Yes    Are you having pitting edema  Yes    Is it Hard or Difficult finding clothes that fit  Yes      Right Lower Extremity Lymphedema   20 cm Proximal to Suprapatella  --    10 cm Proximal to Suprapatella  --    At Midpatella/Popliteal Crease  --    30 cm Proximal to Floor at Lateral Plantar Foot  --    20 cm Proximal to Floor at Lateral Plantar Foot  --    10 cm Proximal to Floor at Lateral Malleoli  --    5 cm Proximal to 1st MTP Joint  --    Across MTP Joint  --    Around Proximal Great Toe  --    Other  not measured due to time today      Left Lower Extremity Lymphedema   20 cm Proximal to Suprapatella  43.7 cm    10 cm Proximal to Suprapatella  42.2 cm    At Midpatella/Popliteal Crease  39 cm    30 cm Proximal to Floor at Lateral Plantar Foot  37.8 cm    20 cm Proximal to Floor at Lateral Plantar Foot  29 cm    10 cm Proximal to Floor at Lateral Malleoli  23.1 cm    5 cm Proximal to 1st MTP Joint  21.4 cm    Across MTP Joint  20.4 cm    Around Proximal Great Toe  6.5 cm             [facility]patient [facility]  Lymphedema Life Impact Scale Total Score  60.29 %      Objective measurements completed on examination: See  above findings.                   PT Long Term Goals - 01/03/18 1716      PT LONG TERM GOAL #1   Title  Pt will receive appropriate compression garments for management of bilateral LE, genital and abdominal swelling to decrease risk of infection    Time  3    Period  Weeks    Status  New    Target Date  01/24/18      PT LONG TERM GOAL #2   Title  Pt will be independent in self MLD for long term management of lymphedema    Time  3    Period  Weeks    Status  New    Target Date  01/24/18      PT LONG TERM GOAL #3   Title  Pt will report a 25% improvmement in bilateral LE, swelling to allow improved comfort    Time  3    Period  Weeks    Status  New    Target Date  01/24/18      PT LONG TERM GOAL #4   Title  Pt  will report a 25% improvmement in bilateral LE, swelling to allow improved comfort             Plan - 01/03/18 1650    Clinical Impression Statement  Pt arrives back to PT after being hospitalized in May for 21 days due to intestinal and colon blockage and need for rerouting of the colostomy.  She is now out of the hospital with complaints of heaviness, tightness, and continued lymphedema in bilateral LEs L>R.  her circumferential measurements have increased from 0-around 1.5cm in the [place] since stopping in May.  She is currently unable to do LE self MLD due to colostomy soreness.  Would like to learn a new way to do that.  Would also like to make sure that her compression garments are optimal for travel to [place].  She is not interested in bandaging at this time because she would be too hot but may do it in the fall if needed.  She is mainly wanting better compression off the shelf and a new way to perform MLD.  Pt reports her kidney MD says continuing treatment at this time is fine.  She thought her MD was Dr. Hoyt Koch at [facility] but this name is not found on the website.     Clinical Impairments Affecting Rehab Potential  1 kidney functioning,  metastatic disease, osteoporosis, genital, abdominal and bilateral LE swelling    PT Frequency  2x / week    PT Duration  3 weeks    PT Treatment/Interventions  ADLs/Self Care Home Management;Therapeutic activities;Therapeutic exercise;Patient/family education;Manual lymph drainage;Manual techniques;Compression bandaging;Passive range of motion;Vasopneumatic Device;Taping;Orthotic Fit/Training    PT Next Visit Plan   circumferential measures Rt LE, MLD to L LE moving towards axillary nodes, education on how to modify self MLD (fig 4 position?), what did [facility] say when she called? referral back for compression? Need any more ideas for thigh high ready to wear for trip?    Recommended Other Services  will try [facility] for off the shelf stockings for the LEs to get out of Ted hose type stockings       Patient will benefit from skilled therapeutic intervention in order to improve the following deficits and impairments:  Increased edema, Difficulty walking, Decreased knowledge of precautions, Decreased endurance, Decreased strength, Pain  Visit Diagnosis: Lymphedema, not elsewhere classified  Muscle weakness (generalized)     Problem List Patient Active Problem List   Diagnosis Date Noted  . Anemia 11/12/2017  . Malnutrition of moderate degree 11/11/2017  . Metastatic breast cancer to small intestine causing SBO s/p SB resection 11/08/2017 11/10/2017  . 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 Cachexia ([place]) 11/10/2017  . New onset left bundle branch block (LBBB) 11/09/2017  . Colostomy obstruction from metastasis s/p colectomy/ostomy revision 11/08/2017 11/08/2017  . 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 Ascites 11/08/2017  . Hypoglycemia 11/04/2017  . Hypokalemia 11/04/2017  . Hypocalcemia 11/04/2017  . Small bowel obstruction from metastasis s/p SB resection 11/08/2017 11/03/2017  . Hydronephrosis  10/24/2017  . 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 Hyponatremia 10/16/2017  . Protein-calorie malnutrition, severe 07/11/2017  . Breast cancer metastasized to multiple sites ([place]) 10/16/2015  . Genetic testing 06/24/2015  . Osteoporosis 12/06/2013  . Cancer of lower-inner quadrant of left female breast ([place]) 03/22/2013  . Breast cancer metastasized to bone [facility]) 10/03/2012    Shan Levans, PT 01/03/2018, 5:18 PM  [place], [place], [address] Phone: [phone]   Fax:  [phone]  Name: Sonia Hill MRN: [id number] Date of Birth: 07/13/1955

## 2018-01-09 ENCOUNTER — Encounter: Payer: Self-pay | Admitting: Physical Therapy

## 2018-01-09 ENCOUNTER — Ambulatory Visit: Payer: 59 | Admitting: Physical Therapy

## 2018-01-09 DIAGNOSIS — M6281 Muscle weakness (generalized): Secondary | ICD-10-CM

## 2018-01-09 DIAGNOSIS — I890 Lymphedema, not elsewhere classified: Secondary | ICD-10-CM | POA: Diagnosis not present

## 2018-01-09 NOTE — Therapy (Signed)
[place], [place], [address] Phone: [phone]   Fax:  [phone]  Physical Therapy Treatment  Patient Details  Name: Sonia Hill MRN: [id number] Date of Birth: 01/23/1956 Referring Provider: Dr. [name]   Encounter Date: 01/09/2018  PT End of Session - 01/09/18 1834    Visit Number  2    Number of Visits  8    Date for PT Re-Evaluation  01/31/18    PT Start Time  1300    PT Stop Time  1350    PT Time Calculation (min)  50 min    Activity Tolerance  Patient tolerated treatment well    Behavior During Therapy  [facility] for tasks assessed/performed       Past Medical History:  Diagnosis Date  . Arthritis    neck, upper back, shoulders - no meds - yoga  . Chronic kidney disease    Only has right kidney  . Depression    Hx - no current problem  . GERD (gastroesophageal reflux disease)    diet controlled - no meds  . Hyperlipidemia    diet controlled  . Hypothyroidism   . lt breast ca dx'd 11/2009  . Osteoporosis, unspecified 12/06/2013  . PONV (postoperative nausea and vomiting)   . SBO (small bowel obstruction) ([place])   . SVD (spontaneous vaginal delivery)    x 1    Past Surgical History:  Procedure Laterality Date  . ABDOMINAL SURGERY    . BLADDER NECK RECONSTRUCTION     made a new urethea tube  . bladder tact  2008  . BOWEL RESECTION N/A 11/08/2017   Procedure: SMALL BOWEL RESECTION;  Surgeon: Michael Boston, MD;  [facility];  Service: General;  Laterality: N/A;  . BREAST SURGERY     masectomy left breast  . coloscopy    . COLOSTOMY    . DILATION AND CURETTAGE OF UTERUS    . GASTROSTOMY N/A 11/08/2017   Procedure: INSERTION OF GASTROSTOMY TUBE;  Surgeon: Michael Boston, MD;  [facility];  Service: General;  Laterality: N/A;  . LAPAROSCOPY N/A 01/24/2013   Procedure: LAPROSCOPY OPERATIVE;  Surgeon: Margarette Asal, MD;  Location: [place] [facility];  Service: Gynecology;  Laterality:  N/A;  . LAPAROSCOPY N/A 11/08/2017   Procedure: LAPAROSCOPY DIAGNOSTIC;  Surgeon: Michael Boston, MD;  [facility];  Service: General;  Laterality: N/A;  . LYSIS OF ADHESION N/A 11/08/2017   Procedure: LYSIS OF ADHESION;  Surgeon: Michael Boston, MD;  [facility];  Service: General;  Laterality: N/A;  . NEPHRECTOMY Left    left kidney dysfunction, recurrent pyelonephritis  . PARTIAL COLECTOMY N/A 11/08/2017   Procedure: PARTIAL COLECTOMY WITH NEW COLOSTOMY;  Surgeon: Michael Boston, MD;  [facility];  Service: General;  Laterality: N/A;  . SALPINGOOPHORECTOMY Bilateral 01/24/2013   Procedure: SALPINGO OOPHORECTOMY;  Surgeon: Margarette Asal, MD;  Location: [facility];  Service: Gynecology;  Laterality: Bilateral;  . WISDOM TOOTH EXTRACTION      There were no vitals filed for this visit.  Subjective Assessment - 01/09/18 1310    Subjective  Pt says she is here today to get measured for her compression stockings for her right leg. She is considering getting more circular knit compression.  She is flying to [place] on Monday.     Pertinent History  BRCA 1-2 negative with lobular breast cancer stage IV at presentation July 2011 status post left mastectomy and sentinel lymph node sampling in  July 2011 for a lower inner quadrant T1 N1 M1, stage IV invasive lobular breast cancer, grade 1, strongly estrogen and progesterone receptor-positive, HER2 negative with MIB-1 of 9% and no HER2 amplification, with multiple sclerotic bone lesions at presentation seen only on CT scan (not on bone scan or PET scan), but  with biopsy-proven metastatic disease to bone; and an elevated CA 27.29 at presentation, single functioning kidney, status post bilateral salpingo-oophorectomy 01/24/2013, with benign pathology osteoporosis; the patient refuses bisphosphonate therapy; started [facility], T score -2.7 and -3., s/p colostomy, recent abdominal cellulits    Patient Stated Goals  to get some of the  swelling controllable    Currently in Pain?  Yes    Pain Score  3     Pain Location  Abdomen colostomy is still healing             LYMPHEDEMA/ONCOLOGY QUESTIONNAIRE - 01/09/18 1313      Right Lower Extremity Lymphedema   10 cm Proximal to Suprapatella  39 cm    At Midpatella/Popliteal Crease  34 cm    30 cm Proximal to Floor at Lateral Plantar Foot  33.5 cm    20 cm Proximal to Floor at Lateral Plantar Foot  26.'1 1    10 ' cm Proximal to Floor at Lateral Malleoli  21.5 cm    5 cm Proximal to 1st MTP Joint  21 cm    Across MTP Joint  20.8 cm    Around Proximal Great Toe  7 cm           [facility]patient [facility]  Lymphedema Life Impact Scale Total Score  60.29 %           [facility] Adult PT Treatment/Exercise - 01/09/18 0001      Manual Therapy   Edema Management  Pt has compromised skin indentions on left leg where TED hose have cut into anterior ankle ,but skin is still intact.  Encouraged pt to go with flat knit garments.  She agreed. She wanted measurement for exo strong thigh high from [employer].  She was measured for these and determined to need an average medium size.  Assisted her with getting these ordered so she could get them in time for trip on monday.  Gave her prescription from Dr. [name] so she could file for her insurance benefit.                   PT Long Term Goals - 01/03/18 1716      PT LONG TERM GOAL #1   Title  Pt will receive appropriate compression garments for management of bilateral LE, genital and abdominal swelling to decrease risk of infection    Time  3    Period  Weeks    Status  New    Target Date  01/24/18      PT LONG TERM GOAL #2   Title  Pt will be independent in self MLD for long term management of lymphedema    Time  3    Period  Weeks    Status  New    Target Date  01/24/18      PT LONG TERM GOAL #3   Title  Pt will report a 25% improvmement in  bilateral LE, swelling to allow improved comfort    Time  3    Period  Weeks    Status  New    Target Date  01/24/18  PT LONG TERM GOAL #4   Title  Pt will report a 25% improvmement in bilateral LE, swelling to allow improved comfort            Plan - 01/09/18 1834    Clinical Impression Statement  Pt would benefit from a flat knit stocking to protect her anterior ankles and better contain her edema.  The elastic in the circular knit is cutting into her.  She agreed. Encourged pt to continue with elevatio and exercise . she wll wear the flat knit on her left leg and the TED hose on the right leg for the flight as she did not have financial resources for flat knit for both legs at this time.     Clinical Impairments Affecting Rehab Potential  1 kidney functioning, metastatic disease, osteoporosis, genital, abdominal and bilateral LE swelling    PT Treatment/Interventions  ADLs/Self Care Home Management;Therapeutic activities;Therapeutic exercise;Patient/family education;Manual lymph drainage;Manual techniques;Compression bandaging;Passive range of motion;Vasopneumatic Device;Taping;Orthotic Fit/Training    PT Next Visit Plan  MLD to L LE moving towards axillary nodes, education on how to modify self MLD (fig 4 position?), what did [facility] say when she called? referral back for compression? Need any more ideas for thigh high ready to wear for trip?    Consulted and Agree with Plan of Care  Patient       Patient will benefit from skilled therapeutic intervention in order to improve the following deficits and impairments:  Increased edema, Difficulty walking, Decreased knowledge of precautions, Decreased endurance, Decreased strength, Pain  Visit Diagnosis: Lymphedema, not elsewhere classified  Muscle weakness (generalized)     Problem List Patient Active Problem List   Diagnosis Date Noted  . Anemia 11/12/2017  . Malnutrition of moderate degree 11/11/2017  .  Metastatic breast cancer to small intestine causing SBO s/p SB resection 11/08/2017 11/10/2017  . 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 Cachexia ([place]) 11/10/2017  . New onset left bundle branch block (LBBB) 11/09/2017  . Colostomy obstruction from metastasis s/p colectomy/ostomy revision 11/08/2017 11/08/2017  . 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 Ascites 11/08/2017  . Hypoglycemia 11/04/2017  . Hypokalemia 11/04/2017  . Hypocalcemia 11/04/2017  . Small bowel obstruction from metastasis s/p SB resection 11/08/2017 11/03/2017  . Hydronephrosis 10/24/2017  . 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 Hyponatremia 10/16/2017  . Protein-calorie malnutrition, severe 07/11/2017  . Breast cancer metastasized to multiple sites ([place]) 10/16/2015  . Genetic testing 06/24/2015  . Osteoporosis 12/06/2013  . Cancer of lower-inner quadrant of left female breast ([place]) 03/22/2013  . Breast cancer metastasized to bone ([place]) 10/03/2012   Donato Heinz. Owens Shark PT  Norwood Levo 01/09/2018, 6:38 PM  [place], [place], [address] Phone: [phone]   Fax:  [phone]  Name: Sonia Hill MRN: [id number] Date of Birth: 09/06/1955

## 2018-01-11 ENCOUNTER — Ambulatory Visit: Payer: 59 | Attending: Oncology | Admitting: Physical Therapy

## 2018-01-11 DIAGNOSIS — I890 Lymphedema, not elsewhere classified: Secondary | ICD-10-CM

## 2018-01-11 DIAGNOSIS — M6281 Muscle weakness (generalized): Secondary | ICD-10-CM

## 2018-01-11 DIAGNOSIS — N179 Acute kidney failure, unspecified: Secondary | ICD-10-CM | POA: Diagnosis not present

## 2018-01-11 NOTE — Therapy (Signed)
[place], [place], [address] Phone: [phone]   Fax:  [phone]  Physical Therapy Treatment  Patient Details  Name: Sonia Hill MRN: [id number] Date of Birth: 06/10/1956 Referring Provider: Dr. [name]   Encounter Date: 01/11/2018  PT End of Session - 01/11/18 1401    Visit Number  3    Number of Visits  8    Date for PT Re-Evaluation  01/31/18    PT Start Time  2423 pt arrived late     PT Stop Time  1350    PT Time Calculation (min)  33 min    Activity Tolerance  Patient tolerated treatment well    Behavior During Therapy  [facility] for tasks assessed/performed       Past Medical History:  Diagnosis Date  . Arthritis    neck, upper back, shoulders - no meds - yoga  . Chronic kidney disease    Only has right kidney  . Depression    Hx - no current problem  . GERD (gastroesophageal reflux disease)    diet controlled - no meds  . Hyperlipidemia    diet controlled  . Hypothyroidism   . lt breast ca dx'd 11/2009  . Osteoporosis, unspecified 12/06/2013  . PONV (postoperative nausea and vomiting)   . SBO (small bowel obstruction) ([place])   . SVD (spontaneous vaginal delivery)    x 1    Past Surgical History:  Procedure Laterality Date  . ABDOMINAL SURGERY    . BLADDER NECK RECONSTRUCTION     made a new urethea tube  . bladder tact  2008  . BOWEL RESECTION N/A 11/08/2017   Procedure: SMALL BOWEL RESECTION;  Surgeon: Michael Boston, MD;  Location: [facility];  Service: General;  Laterality: N/A;  . BREAST SURGERY     masectomy left breast  . coloscopy    . COLOSTOMY    . DILATION AND CURETTAGE OF UTERUS    . GASTROSTOMY N/A 11/08/2017   Procedure: INSERTION OF GASTROSTOMY TUBE;  Surgeon: Michael Boston, MD;  Location: [facility];  Service: General;  Laterality: N/A;  . LAPAROSCOPY N/A 01/24/2013   Procedure: LAPROSCOPY OPERATIVE;  Surgeon: Margarette Asal, MD;  Location: [facility];  Service:  Gynecology;  Laterality: N/A;  . LAPAROSCOPY N/A 11/08/2017   Procedure: LAPAROSCOPY DIAGNOSTIC;  Surgeon: Michael Boston, MD;  Location: [facility];  Service: General;  Laterality: N/A;  . LYSIS OF ADHESION N/A 11/08/2017   Procedure: LYSIS OF ADHESION;  Surgeon: Michael Boston, MD;  Location: [facility];  Service: General;  Laterality: N/A;  . NEPHRECTOMY Left    left kidney dysfunction, recurrent pyelonephritis  . PARTIAL COLECTOMY N/A 11/08/2017   Procedure: PARTIAL COLECTOMY WITH NEW COLOSTOMY;  Surgeon: Michael Boston, MD;  Location: [facility];  Service: General;  Laterality: N/A;  . SALPINGOOPHORECTOMY Bilateral 01/24/2013   Procedure: SALPINGO OOPHORECTOMY;  Surgeon: Margarette Asal, MD;  Location: [facility];  Service: Gynecology;  Laterality: Bilateral;  . WISDOM TOOTH EXTRACTION      There were no vitals filed for this visit.  Subjective Assessment - 01/11/18 1322    Subjective  pt comes in late today as she has difficulty with transportation.  She feels tired but is looking forward to going to [place] next week     Pertinent History  BRCA 1-2 negative with lobular breast cancer stage IV at presentation July 2011 status post left mastectomy and sentinel lymph node sampling in July 2011 for a  lower inner quadrant T1 N1 M1, stage IV invasive lobular breast cancer, grade 1, strongly estrogen and progesterone receptor-positive, HER2 negative with MIB-1 of 9% and no HER2 amplification, with multiple sclerotic bone lesions at presentation seen only on CT scan (not on bone scan or PET scan), but  with biopsy-proven metastatic disease to bone; and an elevated CA 27.29 at presentation, single functioning kidney, status post bilateral salpingo-oophorectomy 01/24/2013, with benign pathology osteoporosis; the patient refuses bisphosphonate therapy; started [facility] February 2014, T score -2.7 and -3., s/p colostomy, recent abdominal cellulits    Patient Stated Goals  to get some of the swelling controllable     Currently in Pain?  Yes    Pain Score  3                   Outpatient Re[facility]03/2018 [facility]  Lymphedema Life Impact Scale Total Score  60.29 %           [facility] Adult PT Treatment/Exercise - 01/11/18 0001      Manual Therapy   Edema Management  treatment done with legs elevated on bolster and pillows to keep feet high. Suggested to pt that she elevate leg on back of couch ( on leg at a time  while she lies horizontally on couch )  and do ankle pumps while she in on her trip to try to get better elevation to legs     Manual Lymphatic Drainage (MLD)  Left shoulder ROM and stationary circles to left axillary nodes, establishment of left inguinal axillary anastamosis, left thigh , knee, lower leg and foot with return along pathyways                   PT Long Term Goals - 01/03/18 1716      PT LONG TERM GOAL #1   Title  Pt will receive appropriate compression garments for management of bilateral LE, genital and abdominal swelling to decrease risk of infection    Time  3    Period  Weeks    Status  New    Target Date  01/24/18      PT LONG TERM GOAL #2   Title  Pt will be independent in self MLD for long term management of lymphedema    Time  3    Period  Weeks    Status  New    Target Date  01/24/18      PT LONG TERM GOAL #3   Title  Pt will report a 25% improvmement in bilateral LE, swelling to allow improved comfort    Time  3    Period  Weeks    Status  New    Target Date  01/24/18      PT LONG TERM GOAL #4   Title  Pt will report a 25% improvmement in bilateral LE, swelling to allow improved comfort            Plan - 01/11/18 1401    Clinical Impression Statement  Treatment performed to left leg which was palpably softer at end of session.  Pt reports her leg feels much better and she is able to move her ankle better. She acknowledged understanding of elevation and exercise while she is on her  trip     PT Frequency  2x / week    PT Duration  3 weeks    PT Next Visit Plan  Reassess after being on vacation and see  if she got the flat knit and was able to wear it. MLD to L LE moving towards axillary nodes, education on how to modify self MLD (fig 4 position?), Progress with exercise as able     Consulted and Agree with Plan of Care  Patient       Patient will benefit from skilled therapeutic intervention in order to improve the following deficits and impairments:     Visit Diagnosis: Lymphedema, not elsewhere classified  Muscle weakness (generalized)     Problem List Patient Active Problem List   Diagnosis Date Noted  . Anemia 11/12/2017  . Malnutrition of moderate degree 11/11/2017  . Metastatic breast cancer to small intestine causing SBO s/p SB resection 11/08/2017 11/10/2017  . 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 Cachexia ([place]) 11/10/2017  . New onset left bundle branch block (LBBB) 11/09/2017  . Colostomy obstruction from metastasis s/p colectomy/ostomy revision 11/08/2017 11/08/2017  . 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 Ascites 11/08/2017  . Hypoglycemia 11/04/2017  . Hypokalemia 11/04/2017  . Hypocalcemia 11/04/2017  . Small bowel obstruction from metastasis s/p SB resection 11/08/2017 11/03/2017  . Hydronephrosis 10/24/2017  . 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 Hyponatremia 10/16/2017  . Protein-calorie malnutrition, severe 07/11/2017  . Breast cancer metastasized to multiple sites ([place]) 10/16/2015  . Genetic testing 06/24/2015  . Osteoporosis 12/06/2013  . Cancer of lower-inner quadrant of left female breast ([place]) 03/22/2013  . Breast cancer metastasized to bone ([place]) 10/03/2012   Donato Heinz. Owens Shark PT  Norwood Levo 01/11/2018, 2:04 PM  [place], [place],  [address] Phone: [phone]   Fax:  [phone]  Name: SARYIAH BENCOSME MRN: [id number] Date of Birth: 20-Jun-1955

## 2018-01-13 ENCOUNTER — Encounter (HOSPITAL_BASED_OUTPATIENT_CLINIC_OR_DEPARTMENT_OTHER): Payer: Self-pay | Admitting: Emergency Medicine

## 2018-01-13 ENCOUNTER — Inpatient Hospital Stay (HOSPITAL_BASED_OUTPATIENT_CLINIC_OR_DEPARTMENT_OTHER)
Admission: EM | Admit: 2018-01-13 | Discharge: 2018-01-16 | DRG: 682 | Disposition: A | Discharge status: Home / Self Care | Payer: 59 | Attending: Internal Medicine | Admitting: Internal Medicine

## 2018-01-13 ENCOUNTER — Other Ambulatory Visit: Payer: Self-pay

## 2018-01-13 ENCOUNTER — Emergency Department (HOSPITAL_BASED_OUTPATIENT_CLINIC_OR_DEPARTMENT_OTHER): Payer: 59

## 2018-01-13 DIAGNOSIS — C50912 Malignant neoplasm of unspecified site of left female breast: Secondary | ICD-10-CM | POA: Diagnosis not present

## 2018-01-13 DIAGNOSIS — K219 Gastro-esophageal reflux disease without esophagitis: Secondary | ICD-10-CM | POA: Diagnosis present

## 2018-01-13 DIAGNOSIS — N183 Chronic kidney disease, stage 3 unspecified: Secondary | ICD-10-CM

## 2018-01-13 DIAGNOSIS — M19011 Primary osteoarthritis, right shoulder: Secondary | ICD-10-CM | POA: Diagnosis present

## 2018-01-13 DIAGNOSIS — Z79899 Other long term (current) drug therapy: Secondary | ICD-10-CM | POA: Diagnosis not present

## 2018-01-13 DIAGNOSIS — E43 Unspecified severe protein-calorie malnutrition: Secondary | ICD-10-CM | POA: Diagnosis present

## 2018-01-13 DIAGNOSIS — E039 Hypothyroidism, unspecified: Secondary | ICD-10-CM | POA: Diagnosis present

## 2018-01-13 DIAGNOSIS — R319 Hematuria, unspecified: Secondary | ICD-10-CM | POA: Diagnosis not present

## 2018-01-13 DIAGNOSIS — Z170 Estrogen receptor positive status [ER+]: Secondary | ICD-10-CM | POA: Diagnosis not present

## 2018-01-13 DIAGNOSIS — R188 Other ascites: Secondary | ICD-10-CM | POA: Diagnosis not present

## 2018-01-13 DIAGNOSIS — D6481 Anemia due to antineoplastic chemotherapy: Secondary | ICD-10-CM | POA: Diagnosis present

## 2018-01-13 DIAGNOSIS — N2889 Other specified disorders of kidney and ureter: Secondary | ICD-10-CM | POA: Diagnosis not present

## 2018-01-13 DIAGNOSIS — Z91048 Other nonmedicinal substance allergy status: Secondary | ICD-10-CM

## 2018-01-13 DIAGNOSIS — Z888 Allergy status to other drugs, medicaments and biological substances status: Secondary | ICD-10-CM

## 2018-01-13 DIAGNOSIS — F419 Anxiety disorder, unspecified: Secondary | ICD-10-CM | POA: Diagnosis present

## 2018-01-13 DIAGNOSIS — Z79890 Hormone replacement therapy (postmenopausal): Secondary | ICD-10-CM | POA: Diagnosis not present

## 2018-01-13 DIAGNOSIS — Z933 Colostomy status: Secondary | ICD-10-CM

## 2018-01-13 DIAGNOSIS — K08409 Partial loss of teeth, unspecified cause, unspecified class: Secondary | ICD-10-CM | POA: Diagnosis present

## 2018-01-13 DIAGNOSIS — Z9012 Acquired absence of left breast and nipple: Secondary | ICD-10-CM

## 2018-01-13 DIAGNOSIS — N189 Chronic kidney disease, unspecified: Secondary | ICD-10-CM

## 2018-01-13 DIAGNOSIS — R1900 Intra-abdominal and pelvic swelling, mass and lump, unspecified site: Secondary | ICD-10-CM

## 2018-01-13 DIAGNOSIS — C801 Malignant (primary) neoplasm, unspecified: Secondary | ICD-10-CM | POA: Diagnosis not present

## 2018-01-13 DIAGNOSIS — N179 Acute kidney failure, unspecified: Secondary | ICD-10-CM | POA: Diagnosis present

## 2018-01-13 DIAGNOSIS — M479 Spondylosis, unspecified: Secondary | ICD-10-CM | POA: Diagnosis present

## 2018-01-13 DIAGNOSIS — N138 Other obstructive and reflux uropathy: Secondary | ICD-10-CM

## 2018-01-13 DIAGNOSIS — D701 Agranulocytosis secondary to cancer chemotherapy: Secondary | ICD-10-CM

## 2018-01-13 DIAGNOSIS — N131 Hydronephrosis with ureteral stricture, not elsewhere classified: Secondary | ICD-10-CM | POA: Diagnosis present

## 2018-01-13 DIAGNOSIS — C50919 Malignant neoplasm of unspecified site of unspecified female breast: Secondary | ICD-10-CM | POA: Diagnosis present

## 2018-01-13 DIAGNOSIS — Z515 Encounter for palliative care: Secondary | ICD-10-CM | POA: Diagnosis not present

## 2018-01-13 DIAGNOSIS — R310 Gross hematuria: Secondary | ICD-10-CM

## 2018-01-13 DIAGNOSIS — C785 Secondary malignant neoplasm of large intestine and rectum: Secondary | ICD-10-CM | POA: Diagnosis not present

## 2018-01-13 DIAGNOSIS — D649 Anemia, unspecified: Secondary | ICD-10-CM | POA: Diagnosis not present

## 2018-01-13 DIAGNOSIS — Z882 Allergy status to sulfonamides status: Secondary | ICD-10-CM

## 2018-01-13 DIAGNOSIS — T451X5A Adverse effect of antineoplastic and immunosuppressive drugs, initial encounter: Secondary | ICD-10-CM | POA: Diagnosis present

## 2018-01-13 DIAGNOSIS — M19012 Primary osteoarthritis, left shoulder: Secondary | ICD-10-CM | POA: Diagnosis present

## 2018-01-13 DIAGNOSIS — C7951 Secondary malignant neoplasm of bone: Secondary | ICD-10-CM | POA: Diagnosis present

## 2018-01-13 DIAGNOSIS — Z90722 Acquired absence of ovaries, bilateral: Secondary | ICD-10-CM | POA: Diagnosis not present

## 2018-01-13 DIAGNOSIS — Z905 Acquired absence of kidney: Secondary | ICD-10-CM

## 2018-01-13 DIAGNOSIS — C762 Malignant neoplasm of abdomen: Secondary | ICD-10-CM

## 2018-01-13 DIAGNOSIS — Z681 Body mass index (BMI) 19 or less, adult: Secondary | ICD-10-CM

## 2018-01-13 DIAGNOSIS — Z87440 Personal history of urinary (tract) infections: Secondary | ICD-10-CM

## 2018-01-13 DIAGNOSIS — Z79811 Long term (current) use of aromatase inhibitors: Secondary | ICD-10-CM | POA: Diagnosis not present

## 2018-01-13 DIAGNOSIS — Z853 Personal history of malignant neoplasm of breast: Secondary | ICD-10-CM

## 2018-01-13 DIAGNOSIS — C786 Secondary malignant neoplasm of retroperitoneum and peritoneum: Secondary | ICD-10-CM | POA: Diagnosis present

## 2018-01-13 DIAGNOSIS — C50312 Malignant neoplasm of lower-inner quadrant of left female breast: Secondary | ICD-10-CM

## 2018-01-13 DIAGNOSIS — Z7189 Other specified counseling: Secondary | ICD-10-CM | POA: Diagnosis not present

## 2018-01-13 DIAGNOSIS — M810 Age-related osteoporosis without current pathological fracture: Secondary | ICD-10-CM | POA: Diagnosis present

## 2018-01-13 DIAGNOSIS — N390 Urinary tract infection, site not specified: Secondary | ICD-10-CM

## 2018-01-13 DIAGNOSIS — C50811 Malignant neoplasm of overlapping sites of right female breast: Secondary | ICD-10-CM | POA: Diagnosis not present

## 2018-01-13 DIAGNOSIS — Z803 Family history of malignant neoplasm of breast: Secondary | ICD-10-CM

## 2018-01-13 DIAGNOSIS — Z9049 Acquired absence of other specified parts of digestive tract: Secondary | ICD-10-CM

## 2018-01-13 DIAGNOSIS — R6510 Systemic inflammatory response syndrome (SIRS) of non-infectious origin without acute organ dysfunction: Secondary | ICD-10-CM

## 2018-01-13 DIAGNOSIS — N1330 Unspecified hydronephrosis: Secondary | ICD-10-CM

## 2018-01-13 DIAGNOSIS — C784 Secondary malignant neoplasm of small intestine: Secondary | ICD-10-CM | POA: Diagnosis not present

## 2018-01-13 DIAGNOSIS — Z85048 Personal history of other malignant neoplasm of rectum, rectosigmoid junction, and anus: Secondary | ICD-10-CM

## 2018-01-13 DIAGNOSIS — E785 Hyperlipidemia, unspecified: Secondary | ICD-10-CM | POA: Diagnosis present

## 2018-01-13 DIAGNOSIS — R948 Abnormal results of function studies of other organs and systems: Secondary | ICD-10-CM | POA: Diagnosis not present

## 2018-01-13 LAB — URINALYSIS, ROUTINE W REFLEX MICROSCOPIC
BILIRUBIN URINE: NEGATIVE
Glucose, UA: NEGATIVE mg/dL
Ketones, ur: NEGATIVE mg/dL
LEUKOCYTES UA: NEGATIVE
NITRITE: NEGATIVE
PH: 6 (ref 5.0–8.0)
PROTEIN: 100 mg/dL — AB
Specific Gravity, Urine: <1.005 — ABNORMAL LOW (ref 1.005–1.030)

## 2018-01-13 LAB — BASIC METABOLIC PANEL
ANION GAP: 14 (ref 5–15)
BUN: 48 mg/dL — ABNORMAL HIGH (ref 8–23)
CALCIUM: 9.5 mg/dL (ref 8.9–10.3)
CO2: 27 mmol/L (ref 22–32)
CREATININE: 3.08 mg/dL — AB (ref 0.44–1.00)
Chloride: 93 mmol/L — ABNORMAL LOW (ref 98–111)
GFR, EST AFRICAN AMERICAN: 18 mL/min — AB (ref >60)
GFR, EST NON AFRICAN AMERICAN: 15 mL/min — AB (ref >60)
Glucose, Bld: 97 mg/dL (ref 70–99)
Potassium: 3.7 mmol/L (ref 3.5–5.1)
SODIUM: 134 mmol/L — AB (ref 135–145)

## 2018-01-13 LAB — URINALYSIS, MICROSCOPIC (REFLEX)

## 2018-01-13 LAB — CBC WITH DIFFERENTIAL/PLATELET
BASOS ABS: 0 10*3/uL (ref 0.0–0.1)
Basophils Relative: 1 %
EOS ABS: 0.1 10*3/uL (ref 0.0–0.7)
Eosinophils Relative: 2 %
HEMATOCRIT: 27.1 % — AB (ref 36.0–46.0)
Hemoglobin: 9.1 g/dL — ABNORMAL LOW (ref 12.0–15.0)
Lymphocytes Relative: 24 %
Lymphs Abs: 0.8 10*3/uL (ref 0.7–4.0)
MCH: 31.8 pg (ref 26.0–34.0)
MCHC: 33.6 g/dL (ref 30.0–36.0)
MCV: 94.8 fL (ref 78.0–100.0)
MONOS PCT: 7 %
Monocytes Absolute: 0.3 10*3/uL (ref 0.1–1.0)
NEUTROS ABS: 2.3 10*3/uL (ref 1.7–7.7)
NEUTROS PCT: 66 %
Platelets: 367 10*3/uL (ref 150–400)
RBC: 2.86 MIL/uL — ABNORMAL LOW (ref 3.87–5.11)
RDW: 15.9 % — AB (ref 11.5–15.5)
WBC: 3.5 10*3/uL — AB (ref 4.0–10.5)

## 2018-01-13 LAB — LACTIC ACID, PLASMA: Lactic Acid, Venous: 1.4 mmol/L (ref 0.5–1.9)

## 2018-01-13 MED ORDER — ONDANSETRON HCL 4 MG PO TABS
4.0000 mg | ORAL_TABLET | Freq: Four times a day (QID) | ORAL | Status: DC | PRN
  Administered 2018-01-14 – 2018-01-15 (×2): 4 mg via ORAL
  Filled 2018-01-13 (×2): qty 1

## 2018-01-13 MED ORDER — SODIUM CHLORIDE 0.9 % IV SOLN
INTRAVENOUS | Status: DC
  Administered 2018-01-13: 75 mL/h via INTRAVENOUS
  Administered 2018-01-14 – 2018-01-15 (×5): via INTRAVENOUS

## 2018-01-13 MED ORDER — HEPARIN SOD (PORK) LOCK FLUSH 100 UNIT/ML IV SOLN
250.0000 [IU] | Freq: Every day | INTRAVENOUS | Status: DC
  Filled 2018-01-13 (×3): qty 2.5

## 2018-01-13 MED ORDER — HYDROCODONE-ACETAMINOPHEN 5-325 MG PO TABS
1.0000 | ORAL_TABLET | ORAL | Status: DC | PRN
  Administered 2018-01-13 – 2018-01-14 (×2): 2 via ORAL
  Filled 2018-01-13 (×2): qty 2

## 2018-01-13 MED ORDER — ABEMACICLIB 100 MG PO TABS: 100.0000 mg | ORAL_TABLET | Freq: Two times a day (BID) | ORAL | Status: DC

## 2018-01-13 MED ORDER — ONDANSETRON HCL 4 MG/2ML IJ SOLN: 4.0000 mg | Freq: Four times a day (QID) | INTRAMUSCULAR | Status: DC | PRN

## 2018-01-13 MED ORDER — THYROID 30 MG PO TABS
75.0000 mg | ORAL_TABLET | Freq: Every day | ORAL | Status: DC
  Administered 2018-01-14 – 2018-01-16 (×3): 75 mg via ORAL
  Filled 2018-01-13 (×3): qty 3

## 2018-01-13 MED ORDER — EXEMESTANE 25 MG PO TABS
25.0000 mg | ORAL_TABLET | Freq: Every day | ORAL | Status: DC
  Administered 2018-01-14 – 2018-01-16 (×3): 25 mg via ORAL
  Filled 2018-01-13 (×3): qty 1

## 2018-01-13 MED ORDER — SIMETHICONE 80 MG PO CHEW: 125.0000 mg | CHEWABLE_TABLET | ORAL | Status: DC | PRN

## 2018-01-13 MED ORDER — CEFTRIAXONE SODIUM 2 G IJ SOLR
2.0000 g | INTRAMUSCULAR | Status: DC
  Administered 2018-01-13 – 2018-01-14 (×2): 2 g via INTRAVENOUS
  Filled 2018-01-13: qty 2
  Filled 2018-01-13: qty 20

## 2018-01-13 MED ORDER — SODIUM CHLORIDE 0.9 % IV BOLUS
500.0000 mL | Freq: Once | INTRAVENOUS | Status: AC
  Administered 2018-01-13: 500 mL via INTRAVENOUS

## 2018-01-13 MED ORDER — CHOLECALCIFEROL 10 MCG (400 UNIT) PO TABS
400.0000 [IU] | ORAL_TABLET | Freq: Every day | ORAL | Status: DC
  Administered 2018-01-14 – 2018-01-16 (×3): 400 [IU] via ORAL
  Filled 2018-01-13 (×3): qty 1

## 2018-01-13 MED ORDER — ACETAMINOPHEN 325 MG PO TABS: 650.0000 mg | ORAL_TABLET | Freq: Four times a day (QID) | ORAL | Status: DC | PRN

## 2018-01-13 MED ORDER — ALPRAZOLAM 0.25 MG PO TABS
0.2500 mg | ORAL_TABLET | Freq: Three times a day (TID) | ORAL | Status: DC | PRN
  Administered 2018-01-13 – 2018-01-14 (×2): 0.25 mg via ORAL
  Filled 2018-01-13 (×2): qty 1

## 2018-01-13 MED ORDER — POLYETHYLENE GLYCOL 3350 17 G PO PACK: 17.0000 g | PACK | Freq: Every day | ORAL | Status: DC

## 2018-01-13 MED ORDER — ACETAMINOPHEN 650 MG RE SUPP: 650.0000 mg | Freq: Four times a day (QID) | RECTAL | Status: DC | PRN

## 2018-01-13 MED ORDER — GABAPENTIN 300 MG PO CAPS
300.0000 mg | ORAL_CAPSULE | Freq: Three times a day (TID) | ORAL | Status: DC | PRN
  Administered 2018-01-14 – 2018-01-15 (×4): 300 mg via ORAL
  Filled 2018-01-13 (×4): qty 1

## 2018-01-13 MED ORDER — FUROSEMIDE 40 MG PO TABS
40.0000 mg | ORAL_TABLET | Freq: Every day | ORAL | Status: DC
  Administered 2018-01-14: 40 mg via ORAL
  Filled 2018-01-13 (×2): qty 1

## 2018-01-13 NOTE — ED Notes (Signed)
ED Provider at bedside. 

## 2018-01-13 NOTE — Progress Notes (Signed)
Called by Providence Lanius, PA at [facility] about Transfer.  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  Urinalysis was done and showed protein the urine so she was directed to the emergency room for further evaluation.  In the ED she is found to have large hemoglobin on her urinalysis and a CT renal stone study was done to rule out a kidney stone and it showed that she had a partial obstruction of her right ureter secondary to the known pelvic mass which likely may have shifted.  EDP contacted urology who recommended transfer to [facility] for further evaluation for a possible stent or nephrostomy tube placement.  [facility] was called to admit this patient for transfer and she was accepted to a MedSurg bed as an inpatient.  Urology Dr. Alyson Ingles is to consult on the patient when patient arrives.  Of note patient has elected to come to [place] directly through personal vehicle rather than ambulance ride.

## 2018-01-13 NOTE — Progress Notes (Addendum)
Pharmacy Antibiotic Note  Sonia Hill is a 62 y.o. female admitted on 01/13/2018 with UTI, pyelonephristis.  Pharmacy has been consulted for Ceftriaxone dosing.  Plan:  Ceftriaxone 2g IV q24h.  Dosage remains stable and need for further dosage adjustment appears unlikely at present.  Pharmacy will sign off at this time.  Please reconsult if a change in clinical status warrants re-evaluation of dosage.   Height: 5\' 1"  (154.9 cm) Weight: 105 lb 3.2 oz (47.7 kg) IBW/kg (Calculated) : 47.8  Temp (24hrs), Avg:98.2 F (36.8 C), Min:98.2 F (36.8 C), Max:98.2 F (36.8 C)  Recent Labs  Lab 01/13/18 1316  WBC 3.5*  CREATININE 3.08*    Estimated Creatinine Clearance: 14.3 mL/min (A) (by C-G formula based on SCr of 3.08 mg/dL (H)).    Allergies  Allergen Reactions  . Sulfa Antibiotics Rash  . Adhesive [Tape] Rash    Ok to use paper tape and tegaderm over IV site  . Betadine [Povidone Iodine] Rash  . Mercury Rash    Reaction mercurachrome    Thank you for allowing pharmacy to be a part of this patient's care.  Leeroy Bock 01/13/2018 9:38 PM

## 2018-01-13 NOTE — ED Notes (Signed)
EDP is made aware that patient and husband prefer to the hospital via private vehicle.

## 2018-01-13 NOTE — H&P (Signed)
[facility]                                                                                    Patient Demographics  Sonia Hill, is a 62 y.o. female  CSN: [id number]  MRN: [id number]  DOB - 01/28/1956  Admit Date - 01/13/2018  Outpatient Primary MD for the patient is [name], MD   With History of -  Past Medical History:  Diagnosis Date  . Arthritis    neck, upper back, shoulders - no meds - yoga  . Chronic kidney disease    Only has right kidney  . Depression    Hx - no current problem  . GERD (gastroesophageal reflux disease)    diet controlled - no meds  . Hyperlipidemia    diet controlled  . Hypothyroidism   . lt breast ca dx'd 11/2009  . Osteoporosis, unspecified 12/06/2013  . PONV (postoperative nausea and vomiting)   . SBO (small bowel obstruction) ([place])   . SVD (spontaneous vaginal delivery)    x 1      Past Surgical History:  Procedure Laterality Date  . ABDOMINAL SURGERY    . BLADDER NECK RECONSTRUCTION     made a new urethea tube  . bladder tact  2008  . BOWEL RESECTION N/A 11/08/2017   Procedure: SMALL BOWEL RESECTION;  Surgeon: Michael Boston, MD;  Location: [facility];  Service: General;  Laterality: N/A;  . BREAST SURGERY     masectomy left breast  . coloscopy    . COLOSTOMY    . DILATION AND CURETTAGE OF UTERUS    . GASTROSTOMY N/A 11/08/2017   Procedure: INSERTION OF GASTROSTOMY TUBE;  Surgeon: Michael Boston, MD;  Location: [facility];  Service: General;  Laterality: N/A;  . LAPAROSCOPY N/A 01/24/2013   Procedure: LAPROSCOPY OPERATIVE;  Surgeon: Margarette Asal, MD;  Location: [facility];  Service: Gynecology;  Laterality: N/A;  . LAPAROSCOPY N/A 11/08/2017   Procedure: LAPAROSCOPY DIAGNOSTIC;  Surgeon: Michael Boston, MD;  Location: [facility];  Service: General;  Laterality: N/A;  . LYSIS OF ADHESION N/A 11/08/2017   Procedure: LYSIS OF ADHESION;  Surgeon: Michael Boston, MD;  Location: [facility];  Service: General;  Laterality: N/A;  .  NEPHRECTOMY Left    left kidney dysfunction, recurrent pyelonephritis  . PARTIAL COLECTOMY N/A 11/08/2017   Procedure: PARTIAL COLECTOMY WITH NEW COLOSTOMY;  Surgeon: Michael Boston, MD;  Location: [facility];  Service: General;  Laterality: N/A;  . SALPINGOOPHORECTOMY Bilateral 01/24/2013   Procedure: SALPINGO OOPHORECTOMY;  Surgeon: Margarette Asal, MD;  Location: [facility];  Service: Gynecology;  Laterality: Bilateral;  . WISDOM TOOTH EXTRACTION      in for   Chief Complaint  Patient presents with  . Hematuria     HPI  Sonia Hill  is a 62 y.o. female, with a past medical history significant for metastatic breast cancer, stage IV with peritoneal carcinomatosis and pelvic mass presenting with an episode of hematuria that improved with over-the-counter dysuria medications however was found to have partial obstruction of the right ureter secondary to known pelvic mass..  The patient was found to be in acute renal  failure as well and was transferred to our facility to be evaluated by urology and treatment.  Patient has a history of small bowel obstruction status post colectomy and ostomy with revision around 6 weeks ago.  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  No history of fever or chills, no history of nausea or vomiting however patient reports a history of right ureteral reimplant around 30 years ago .    Review of Systems    In addition to the HPI above,  No Fever-chills, No Headache, No changes with Vision or hearing, No problems swallowing food or Liquids, No Chest pain, Cough or Shortness of Breath, No Abdominal pain, No Nausea or Vommitting,  No Blood in stool or Urine, No new skin rashes or bruises, No new joints pains-aches,  No new weakness, tingling, numbness in any extremity, No recent weight gain or loss,   A full 10 point Review of Systems was done, except as stated above, all other Review of  Systems were negative.   Social History Social History   Tobacco Use  . Smoking status: Never Smoker  . Smokeless tobacco: Never Used  Substance Use Topics  . Alcohol use: No     Family History Family History  Problem Relation Age of Onset  . Breast cancer Mother 9  . Breast cancer Maternal Aunt 58  . Breast cancer Other        MGF's sister dx with breast cancer in her 66s     Prior to Admission medications   Medication Sig Start Date End Date Taking? Authorizing Provider  abemaciclib (VERZENIO) 100 MG tablet Take 1 tablet (100 mg total) by mouth 2 (two) times daily. Swallow tablets whole. Do not chew, crush, or split tablets before swallowing. 11/30/17  Yes Magrinat, Virgie Dad, MD  ALPRAZolam Duanne Moron) 0.25 MG tablet Take 1 tablet (0.25 mg total) by mouth every 8 (eight) hours as needed. for anxiety 12/26/17  Yes Magrinat, Virgie Dad, MD  CALCIUM PO Take 2 tablets by mouth 2 (two) times daily.   Yes [provider]  cetirizine (ZYRTEC) 10 MG tablet Take 10 mg by mouth daily.   Yes [provider]  cholecalciferol (D-VI-SOL) 400 UNIT/ML LIQD Take 400 Units by mouth daily.   Yes [provider]  D-MANNOSE PO Take 1 tablet by mouth daily.   Yes [provider]  Digestive Enzymes (DIGESTIVE ENZYME PO) Take 1 tablet by mouth daily.   Yes [provider]  exemestane (AROMASIN) 25 MG tablet Take 1 tablet (25 mg total) by mouth daily after breakfast. 07/05/17  Yes Magrinat, Virgie Dad, MD  furosemide (LASIX) 40 MG tablet Take 1 tablet (40 mg total) by mouth daily. 11/29/17 01/13/18 Yes Magrinat, Virgie Dad, MD  gabapentin (NEURONTIN) 300 MG capsule Take 1 capsule (300 mg total) by mouth 3 (three) times daily as needed. Patient taking differently: Take 300 mg by mouth 3 (three) times daily as needed (nerve pain).  06/28/17  Yes Magrinat, Virgie Dad, MD  Multiple Vitamin (MULTIVITAMIN WITH MINERALS) TABS tablet Take 1 tablet by mouth daily.   Yes [provider]  Omega-3 Fatty Acids (FISH OIL PO) Take 5 mLs by mouth daily.   Yes [provider]  ondansetron (ZOFRAN-ODT) 8 MG disintegrating tablet Take 1 tablet (8 mg total) by mouth every 8 (eight) hours as needed for nausea or vomiting. 06/22/17  Yes Magrinat, Virgie Dad, MD  Probiotic Product (PROBIOTIC DAILY PO) Take 1 tablet by mouth daily.   Yes [provider]  simethicone (MYLICON) 903 MG chewable tablet Chew 125 mg by mouth every 2 (two) hours as needed for flatulence.    Yes [provider]  Thyroid (NATURE-THROID) 81.25 MG TABS Take 81.25 mg by mouth daily.   Yes [provider]  heparin lock flush 100 UNIT/ML SOLN injection Inject 2.5 mLs (250 Units total) into the vein daily for 30 doses. 11/24/17 12/24/17  Alma Friendly, MD    Allergies  Allergen Reactions  . Sulfa Antibiotics Rash  . Adhesive [Tape] Rash    Ok to use paper tape and tegaderm over IV site  . Betadine [Povidone Iodine] Rash  . Mercury Rash    Reaction mercurachrome    Physical Exam  Vitals  Blood pressure (!) 142/78, pulse (!) 107, temperature 98.2 F (36.8 C), temperature source Oral, resp. rate 18, height 5\' 1"  (1.549 m), weight 47.7 kg (105 lb 3.2 oz), SpO2 100 %.   1. General chronically ill, extremely pleasant, in no acute distress at this time  2. Normal affect and insight, Not Suicidal or Homicidal, Awake Alert, Oriented X 3.  3. No F.N deficits, grossly, patient moving all extremities  4. Ears and Eyes appear Normal, Conjunctivae clear, PERRLA. Moist Oral Mucosa.  5. Supple Neck, No JVD, No cervical lymphadenopathy appriciated, No Carotid Bruits.  6. Symmetrical Chest wall movement, Good air movement bilaterally, CTAB.  7. RRR, 2/6 systolic murmur heard at the second intercostal space, No Parasternal Heave.  8. Positive Bowel Sounds, Abdomen Soft, Non tender, colostomy noted on the left   9.  No Cyanosis, Normal Skin Turgor, No Skin Rash or  Bruise.  10. Good muscle tone,  joints appear normal , no effusions, left leg swelling noted.  11. No Palpable Lymph Nodes in Neck or Axillae    Data Review  CBC Recent Labs  Lab 01/13/18 1316  WBC 3.5*  HGB 9.1*  HCT 27.1*  PLT 367  MCV 94.8  MCH 31.8  MCHC 33.6  RDW 15.9*  LYMPHSABS 0.8  MONOABS 0.3  EOSABS 0.1  BASOSABS 0.0   ------------------------------------------------------------------------------------------------------------------  Chemistries  Recent Labs  Lab 01/13/18 1316  NA 134*  K 3.7  CL 93*  CO2 27  GLUCOSE 97  BUN 48*  CREATININE 3.08*  CALCIUM 9.5   ------------------------------------------------------------------------------------------------------------------ estimated creatinine clearance is 14.3 mL/min (A) (by C-G formula based on SCr of 3.08 mg/dL (H)). ------------------------------------------------------------------------------------------------------------------ No results for input(s): TSH, T4TOTAL, T3FREE, THYROIDAB in the last 72 hours.  Invalid input(s): FREET3   Coagulation profile No results for input(s): INR, PROTIME in the last 168 hours. ------------------------------------------------------------------------------------------------------------------- No results for input(s): DDIMER in the last 72 hours. -------------------------------------------------------------------------------------------------------------------  Cardiac Enzymes No results for input(s): CKMB, TROPONINI, MYOGLOBIN in the last 168 hours.  Invalid input(s): CK ------------------------------------------------------------------------------------------------------------------ Invalid input(s): POCBNP   ---------------------------------------------------------------------------------------------------------------  Urinalysis    Component Value Date/Time   COLORURINE YELLOW 01/13/2018 1226   APPEARANCEUR CLEAR 01/13/2018 1226   LABSPEC <1.005  (L) 01/13/2018 1226   LABSPEC 1.005 08/11/2015 1140   PHURINE 6.0 01/13/2018 1226   GLUCOSEU NEGATIVE 01/13/2018 1226   GLUCOSEU Negative 08/11/2015 1140   HGBUR LARGE (A) 01/13/2018 1226   BILIRUBINUR NEGATIVE 01/13/2018 1226   BILIRUBINUR Negative 08/11/2015 1140   KETONESUR NEGATIVE 01/13/2018 1226   PROTEINUR 100 (A) 01/13/2018 1226   UROBILINOGEN 0.2 08/11/2015 1140   NITRITE NEGATIVE 01/13/2018 1226   LEUKOCYTESUR NEGATIVE 01/13/2018  1226   LEUKOCYTESUR Negative 08/11/2015 1140    ----------------------------------------------------------------------------------------------------------------  .   Imaging results:   Ct Renal Stone Study  Result Date: 01/13/2018 CLINICAL DATA:  Pt with pelvic pain and hematuria, no hx renal stones, hx bladder spasms Hx left breast ca with mastectomy, left nephrectomy, gastrostomy, colectomy, CKD. 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 EXAM: CT ABDOMEN AND PELVIS WITHOUT CONTRAST TECHNIQUE: Multidetector CT imaging of the abdomen and pelvis was performed following the standard protocol without IV contrast. COMPARISON:  CT of the abdomen and pelvis on 12/01/2017 FINDINGS: Lower chest: Trace RIGHT pleural effusion. Heart size is normal. No pericardial effusion or significant coronary artery calcifications. LEFT mastectomy. Hepatobiliary: The liver is homogeneous. Gallbladder is decompressed. Pancreas: Unremarkable. No pancreatic ductal dilatation or surrounding inflammatory changes. Spleen: Normal in size without focal abnormality. Adrenals/Urinary Tract: LEFT nephrectomy. RIGHT adrenal gland is normal. There is moderate RIGHT hydronephrosis, increased slightly since prior study. The RIGHT proximal ureter is dilated. The distal ureter is likely occluded by pelvic mass and irregular thickening in the posterior bladder wall,  LEFT greater than RIGHT. Stomach/Bowel: Gastrostomy tube in place. Marked rectal wall thickening again noted. Thickening of the distal transverse colon proximal to the LEFT mid abdominal ostomy. Vascular/Lymphatic: Minimal atherosclerotic calcification of the abdominal aorta. Evaluation adenopathy is limited by lack of intravenous contrast. Reproductive: Poorly defined soft tissue mass within the cul-de-sac and involving the bladder, measuring at least 7.8 x 5.3 centimeters. There is adjacent abnormal thickening/mass of the rectosigmoid colon. Other: Ascites.  Anasarca. Musculoskeletal: No acute or significant osseous findings. IMPRESSION: 1. Persistent significant ascites, consistent with known carcinomatosis. 2. Slight increase in RIGHT hydronephrosis. Pelvic mass is probably stable accounting for differences in technique. 3. Marked thickening of the rectosigmoid colon. Distal transverse colon is mildly thickened proximal to the ostomy. 4. Trace RIGHT pleural effusion. 5. LEFT nephrectomy. Electronically Signed   By: Nolon Nations M.D.   On: 01/13/2018 15:28      Assessment & Plan  1.  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  Pelvic mass shift to the right      History of right ureteral reimplant  in the past      To be evaluated by urology in a.m. for possible stent placement       Foley placed  2.  Acute renal failure .  Creatinine increased from 1.33-3.08     Continue with IV fluids     Foley catheter     Check labs in a.m.  3.  Possible UTI /code sepsis advised , awaiting lactic acid.  Patient is     clinically fair      Continue with Rocephin      Awaiting culture  4.  Metastatic breast cancer      Continue with chemotherapy  5.  Abdominal carcinomatosis with pelvic mass status post small bowel obstruction and placement of ostomy on the left      Continue with chemotherapy  6.   Hypothyroidism: Continue Synthroid  7.  Leukopenia      Monitor     Probably chemotherapy-induced  8.  Anxiety continue with Xanax      DVT Prophylaxis bilateral TED hose   AM Labs Ordered, also please review Full Orders  Family Communication: Admission, patients  condition and plan of care including tests being ordered have been discussed with the patient and husband who indicate understanding and agree with the plan and Code Status.  Code Status full  Disposition Plan: Undetermined  Time spent in minutes : 48 minutes  Condition GUARDED   @SIGNATURE @

## 2018-01-13 NOTE — ED Triage Notes (Signed)
Hematuria today. Sent from [facility] for eval.

## 2018-01-13 NOTE — ED Provider Notes (Signed)
[facility] EMERGENCY DEPARTMENT Provider Note   CSN: [id number] Arrival date & time: 01/13/18  1212     History   Chief Complaint Chief Complaint  Patient presents with  . Hematuria    HPI Sonia Hill is a 62 y.o. female past medical history stage IV metastatic breast cancer, chronic kidney disease, (history of left nephrectomy)presents for evaluation hematuria that began today.  Patient reports that she had hematuria this morning.  She states she took an over-the-counter medication called she takes to prevent urinary tract infections.  Patient reports that she had improvement in the hematuria.  She went to [facility] walk-in clinic for evaluation.  At that time, her UA was positive for microscopic globin and had protein in her urine.  Patient was sent to the ED for further evaluation.  Patient states she is having some light suprapubic abdominal pressure.  She states she has not noticed any more hematuria.  She is not having any dysuria.  Patient states that she has a history of stage IV breast cancer that had invaded her rectum and bones.  Patient has an ostomy bag secondary to rectal cancer.  Patient denies any recent fevers, nausea/vomiting, flank pain, dysuria.  The history is provided by the patient.    Past Medical History:  Diagnosis Date  . Arthritis    neck, upper back, shoulders - no meds - yoga  . Chronic kidney disease    Only has right kidney  . Depression    Hx - no current problem  . GERD (gastroesophageal reflux disease)    diet controlled - no meds  . Hyperlipidemia    diet controlled  . Hypothyroidism   . lt breast ca dx'd 11/2009  . Osteoporosis, unspecified 12/06/2013  . PONV (postoperative nausea and vomiting)   . SBO (small bowel obstruction) ([place])   . SVD (spontaneous vaginal delivery)    x 1    Patient Active Problem List   Diagnosis Date Noted  . Acute kidney injury superimposed on CKD ([place]) 01/13/2018  . Acute kidney injury ([place])   .  Gross hematuria   . Anemia 11/12/2017  . Malnutrition of moderate degree 11/11/2017  . Metastatic breast cancer to small intestine causing SBO s/p SB resection 11/08/2017 11/10/2017  . 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 Cachexia ([place]) 11/10/2017  . New onset left bundle branch block (LBBB) 11/09/2017  . Colostomy obstruction from metastasis s/p colectomy/ostomy revision 11/08/2017 11/08/2017  . 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 Ascites 11/08/2017  . Hypoglycemia 11/04/2017  . Hypokalemia 11/04/2017  . Hypocalcemia 11/04/2017  . Small bowel obstruction from metastasis s/p SB resection 11/08/2017 11/03/2017  . Hydronephrosis 10/24/2017  . 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 Hyponatremia 10/16/2017  . Protein-calorie malnutrition, severe 07/11/2017  . Breast cancer metastasized to multiple sites ([place]) 10/16/2015  . Genetic testing 06/24/2015  . Osteoporosis 12/06/2013  . Cancer of lower-inner quadrant of left female breast ([place]) 03/22/2013  . Breast cancer metastasized to bone [facility]) 10/03/2012    Past Surgical History:  Procedure Laterality Date  . ABDOMINAL SURGERY    . BLADDER NECK RECONSTRUCTION     made a new urethea tube  . bladder tact  2008  . BOWEL RESECTION N/A 11/08/2017   Procedure: SMALL BOWEL RESECTION;  Surgeon: Michael Boston, MD;  Location: [facility]S;  Service: General;  Laterality: N/A;  . BREAST SURGERY  masectomy left breast  . coloscopy    . COLOSTOMY    . DILATION AND CURETTAGE OF UTERUS    . GASTROSTOMY N/A 11/08/2017   Procedure: INSERTION OF GASTROSTOMY TUBE;  Surgeon: Michael Boston, MD;  Location: [facility];  Service: General;  Laterality: N/A;  . LAPAROSCOPY N/A 01/24/2013   Procedure: LAPROSCOPY OPERATIVE;  Surgeon: Margarette Asal, MD;  Location: [facility];  Service: Gynecology;  Laterality: N/A;  . LAPAROSCOPY N/A 11/08/2017   Procedure: LAPAROSCOPY  DIAGNOSTIC;  Surgeon: Michael Boston, MD;  Location: [facility];  Service: General;  Laterality: N/A;  . LYSIS OF ADHESION N/A 11/08/2017   Procedure: LYSIS OF ADHESION;  Surgeon: Michael Boston, MD;  Location: [facility];  Service: General;  Laterality: N/A;  . NEPHRECTOMY Left    left kidney dysfunction, recurrent pyelonephritis  . PARTIAL COLECTOMY N/A 11/08/2017   Procedure: PARTIAL COLECTOMY WITH NEW COLOSTOMY;  Surgeon: Michael Boston, MD;  Location: [facility];  Service: General;  Laterality: N/A;  . SALPINGOOPHORECTOMY Bilateral 01/24/2013   Procedure: SALPINGO OOPHORECTOMY;  Surgeon: Margarette Asal, MD;  Location: [facility];  Service: Gynecology;  Laterality: Bilateral;  . WISDOM TOOTH EXTRACTION       OB History   None      Home Medications    Prior to Admission medications   Medication Sig Start Date End Date Taking? Authorizing Provider  abemaciclib (VERZENIO) 100 MG tablet Take 1 tablet (100 mg total) by mouth 2 (two) times daily. Swallow tablets whole. Do not chew, crush, or split tablets before swallowing. 11/30/17   Magrinat, Virgie Dad, MD  ALPRAZolam Duanne Moron) 0.25 MG tablet Take 1 tablet (0.25 mg total) by mouth every 8 (eight) hours as needed. for anxiety 12/26/17   Magrinat, Virgie Dad, MD  cholecalciferol (D-VI-SOL) 400 UNIT/ML LIQD Take 400 Units by mouth daily.    [provider]  exemestane (AROMASIN) 25 MG tablet Take 1 tablet (25 mg total) by mouth daily after breakfast. 07/05/17   Magrinat, Virgie Dad, MD  furosemide (LASIX) 40 MG tablet Take 1 tablet (40 mg total) by mouth daily. 11/29/17 12/29/17  Magrinat, Virgie Dad, MD  gabapentin (NEURONTIN) 300 MG capsule Take 1 capsule (300 mg total) by mouth 3 (three) times daily as needed. Patient taking differently: Take 300 mg by mouth 3 (three) times daily as needed (nerve pain).  06/28/17   Magrinat, Virgie Dad, MD  heparin lock flush 100 UNIT/ML SOLN injection Inject 2.5 mLs (250 Units total) into the vein daily for 30 doses. 11/24/17  12/24/17  Alma Friendly, MD  ondansetron (ZOFRAN-ODT) 8 MG disintegrating tablet Take 1 tablet (8 mg total) by mouth every 8 (eight) hours as needed for nausea or vomiting. 06/22/17   Magrinat, Virgie Dad, MD  polyethylene glycol (MIRALAX / GLYCOLAX) packet Take 17 g by mouth daily.    [provider]  simethicone (MYLICON) 485 MG chewable tablet Chew 125 mg by mouth every 2 (two) hours as needed for flatulence.     [provider]  Thyroid (NATURE-THROID) 81.25 MG TABS Take 81.25 mg by mouth daily.    [provider]    Family History Family History  Problem Relation Age of Onset  . Breast cancer Mother 33  . Breast cancer Maternal Aunt 58  . Breast cancer Other        MGF's sister dx with breast cancer in her 18s    Social History Social History   Tobacco Use  . Smoking status: Never Smoker  .  Smokeless tobacco: Never Used  Substance Use Topics  . Alcohol use: No  . Drug use: No     Allergies   Sulfa antibiotics; Adhesive [tape]; Betadine [povidone iodine]; and Mercury   Review of Systems Review of Systems  Constitutional: Negative for fever.  Respiratory: Negative for cough and shortness of breath.   Cardiovascular: Negative for chest pain.  Gastrointestinal: Negative for abdominal pain, nausea and vomiting.  Genitourinary: Positive for hematuria. Negative for dysuria.  Neurological: Negative for headaches.  All other systems reviewed and are negative.    Physical Exam Updated Vital Signs BP 140/86 (BP Location: Right Arm)   Pulse 94   Temp 98.2 F (36.8 C) (Oral)   Resp 18   Ht 5\' 1"  (1.549 m)   Wt 47.7 kg (105 lb 3.2 oz)   SpO2 100%   BMI 19.88 kg/m   Physical Exam  Constitutional: She is oriented to person, place, and time. She appears well-developed and well-nourished.  HENT:  Head: Normocephalic and atraumatic.  Mouth/Throat: Oropharynx is clear and moist and mucous membranes are normal.  Eyes: Pupils are equal,  round, and reactive to light. Conjunctivae, EOM and lids are normal.  Neck: Full passive range of motion without pain.  Cardiovascular: Normal rate, regular rhythm, normal heart sounds and normal pulses. Exam reveals no gallop and no friction rub.  No murmur heard. Pulmonary/Chest: Effort normal and breath sounds normal.  Abdominal: Soft. Normal appearance. She exhibits distension. There is tenderness in the suprapubic area. There is no rigidity and no guarding.  Ostomy bag noted to the right abdomen.  No surrounding warmth, erythema.  Mild suprapubic abdominal tenderness.  Slight distention of abdomen.  No rigidity, guarding.  Musculoskeletal: Normal range of motion.  Neurological: She is alert and oriented to person, place, and time.  Skin: Skin is warm and dry. Capillary refill takes less than 2 seconds.  Psychiatric: She has a normal mood and affect. Her speech is normal.  Nursing note and vitals reviewed.    ED Treatments / Results  Labs (all labs ordered are listed, but only abnormal results are displayed) Labs Reviewed  URINALYSIS, ROUTINE W REFLEX MICROSCOPIC - Abnormal; Notable for the following components:      Result Value   Specific Gravity, Urine <1.005 (*)    Hgb urine dipstick LARGE (*)    Protein, ur 100 (*)    All other components within normal limits  BASIC METABOLIC PANEL - Abnormal; Notable for the following components:   Sodium 134 (*)    Chloride 93 (*)    BUN 48 (*)    Creatinine, Ser 3.08 (*)    GFR calc non Af Amer 15 (*)    GFR calc Af Amer 18 (*)    All other components within normal limits  CBC WITH DIFFERENTIAL/PLATELET - Abnormal; Notable for the following components:   WBC 3.5 (*)    RBC 2.86 (*)    Hemoglobin 9.1 (*)    HCT 27.1 (*)    RDW 15.9 (*)    All other components within normal limits  URINALYSIS, MICROSCOPIC (REFLEX) - Abnormal; Notable for the following components:   Bacteria, UA FEW (*)    All other components within normal limits      EKG None  Radiology Ct Renal Stone Study  Result Date: 01/13/2018 CLINICAL DATA:  Pt with pelvic pain and hematuria, no hx renal stones, hx bladder spasms Hx left breast ca with mastectomy, left nephrectomy, gastrostomy, colectomy, CKD. Per previous  history, patient has metastatic carcinomatosis to the abdomen and pelvis and history of multiple bowel surgeries for bowel obstructions related to carcinomatosis. EXAM: CT ABDOMEN AND PELVIS WITHOUT CONTRAST TECHNIQUE: Multidetector CT imaging of the abdomen and pelvis was performed following the standard protocol without IV contrast. COMPARISON:  CT of the abdomen and pelvis on 12/01/2017 FINDINGS: Lower chest: Trace RIGHT pleural effusion. Heart size is normal. No pericardial effusion or significant coronary artery calcifications. LEFT mastectomy. Hepatobiliary: The liver is homogeneous. Gallbladder is decompressed. Pancreas: Unremarkable. No pancreatic ductal dilatation or surrounding inflammatory changes. Spleen: Normal in size without focal abnormality. Adrenals/Urinary Tract: LEFT nephrectomy. RIGHT adrenal gland is normal. There is moderate RIGHT hydronephrosis, increased slightly since prior study. The RIGHT proximal ureter is dilated. The distal ureter is likely occluded by pelvic mass and irregular thickening in the posterior bladder wall, LEFT greater than RIGHT. Stomach/Bowel: Gastrostomy tube in place. Marked rectal wall thickening again noted. Thickening of the distal transverse colon proximal to the LEFT mid abdominal ostomy. Vascular/Lymphatic: Minimal atherosclerotic calcification of the abdominal aorta. Evaluation adenopathy is limited by lack of intravenous contrast. Reproductive: Poorly defined soft tissue mass within the cul-de-sac and involving the bladder, measuring at least 7.8 x 5.3 centimeters. There is adjacent abnormal thickening/mass of the rectosigmoid colon. Other: Ascites.  Anasarca. Musculoskeletal: No acute or significant  osseous findings. IMPRESSION: 1. Persistent significant ascites, consistent with known carcinomatosis. 2. Slight increase in RIGHT hydronephrosis. Pelvic mass is probably stable accounting for differences in technique. 3. Marked thickening of the rectosigmoid colon. Distal transverse colon is mildly thickened proximal to the ostomy. 4. Trace RIGHT pleural effusion. 5. LEFT nephrectomy. Electronically Signed   By: Nolon Nations M.D.   On: 01/13/2018 15:28    Procedures Procedures (including critical care time)  Medications Ordered in ED Medications  sodium chloride 0.9 % bolus 500 mL (0 mLs Intravenous Stopped 01/13/18 1630)     Initial Impression / Assessment and Plan / ED Course  I have reviewed the triage vital signs and the nursing notes.  Pertinent labs & imaging results that were available during my care of the patient were reviewed by me and considered in my medical decision making (see chart for details).     62 year old female past medical history of  past medical history stage IV metastatic breast cancer, chronic kidney disease, (history of left nephrectomy) presents for evaluation of hematuria that began today.  Patient went to [facility] walk-in clinic on is advised to come to the ED for further evaluation.  Patient is afebrile, non-toxic appearing, sitting comfortably on examination table. Vital signs reviewed and stable.  Plan to check basic labs, UA.  UA shows large hemoglobin.  Some protein noted.  BC shows leukopenia at 3.5.  Hemoglobin and hematocrit are 9.1 and 27.1 respectively.  BMP shows sodium 134.  BUN is elevated at 48 and creatinine is elevated at 3.08.  Most recent CMP 3 weeks ago showed BUN at 27 and creatinine of 1.33.  The new elevation in creatinine, will plan for CT scan for evaluation of obstruction.  CT abdomen pelvis shows that the right proximal ureter is dilated.  Distal ureter is likely occluded by pelvic mass and irregular thickening in the posterior bladder  wall.  Discussed patient with Dr. Noah Delaine (Urology).  Recommends admission to [facility] for possible stent or nephrostomy tube placement.  Will consult hospitalist for admission.  Discussed with Dr. Delmer Islam (hospitalist).  Accepts patient for admission.  I discussed with hospitalist that patient would  like to go POV to [facility].  Updated patient on plan.  She will go P[facility] to [facility] for [facility].  She is instructed to go directly to [facility] for further evaluation.  Final Clinical Impressions(s) / ED Diagnoses   Final diagnoses:  Acute kidney injury [facility])    ED Discharge Orders    None       Desma Mcgregor 01/13/18 Pryor Ochoa, MD 01/16/18 1257

## 2018-01-14 ENCOUNTER — Encounter (HOSPITAL_COMMUNITY): Payer: Self-pay | Admitting: Interventional Radiology

## 2018-01-14 ENCOUNTER — Inpatient Hospital Stay (HOSPITAL_COMMUNITY): Payer: 59

## 2018-01-14 ENCOUNTER — Other Ambulatory Visit: Payer: Self-pay

## 2018-01-14 DIAGNOSIS — D649 Anemia, unspecified: Secondary | ICD-10-CM

## 2018-01-14 DIAGNOSIS — R188 Other ascites: Secondary | ICD-10-CM

## 2018-01-14 DIAGNOSIS — N2889 Other specified disorders of kidney and ureter: Secondary | ICD-10-CM

## 2018-01-14 DIAGNOSIS — E43 Unspecified severe protein-calorie malnutrition: Secondary | ICD-10-CM

## 2018-01-14 DIAGNOSIS — E039 Hypothyroidism, unspecified: Secondary | ICD-10-CM

## 2018-01-14 DIAGNOSIS — N1330 Unspecified hydronephrosis: Secondary | ICD-10-CM

## 2018-01-14 DIAGNOSIS — R948 Abnormal results of function studies of other organs and systems: Secondary | ICD-10-CM

## 2018-01-14 DIAGNOSIS — C50919 Malignant neoplasm of unspecified site of unspecified female breast: Secondary | ICD-10-CM

## 2018-01-14 DIAGNOSIS — R319 Hematuria, unspecified: Secondary | ICD-10-CM

## 2018-01-14 DIAGNOSIS — Z79899 Other long term (current) drug therapy: Secondary | ICD-10-CM

## 2018-01-14 DIAGNOSIS — Z933 Colostomy status: Secondary | ICD-10-CM

## 2018-01-14 DIAGNOSIS — C785 Secondary malignant neoplasm of large intestine and rectum: Secondary | ICD-10-CM

## 2018-01-14 DIAGNOSIS — D701 Agranulocytosis secondary to cancer chemotherapy: Secondary | ICD-10-CM

## 2018-01-14 DIAGNOSIS — C786 Secondary malignant neoplasm of retroperitoneum and peritoneum: Secondary | ICD-10-CM

## 2018-01-14 DIAGNOSIS — C50912 Malignant neoplasm of unspecified site of left female breast: Secondary | ICD-10-CM

## 2018-01-14 DIAGNOSIS — M810 Age-related osteoporosis without current pathological fracture: Secondary | ICD-10-CM

## 2018-01-14 DIAGNOSIS — T451X5A Adverse effect of antineoplastic and immunosuppressive drugs, initial encounter: Secondary | ICD-10-CM

## 2018-01-14 DIAGNOSIS — R6510 Systemic inflammatory response syndrome (SIRS) of non-infectious origin without acute organ dysfunction: Secondary | ICD-10-CM

## 2018-01-14 DIAGNOSIS — K219 Gastro-esophageal reflux disease without esophagitis: Secondary | ICD-10-CM

## 2018-01-14 DIAGNOSIS — N390 Urinary tract infection, site not specified: Secondary | ICD-10-CM

## 2018-01-14 DIAGNOSIS — E785 Hyperlipidemia, unspecified: Secondary | ICD-10-CM

## 2018-01-14 DIAGNOSIS — Z170 Estrogen receptor positive status [ER+]: Secondary | ICD-10-CM

## 2018-01-14 DIAGNOSIS — C784 Secondary malignant neoplasm of small intestine: Secondary | ICD-10-CM

## 2018-01-14 DIAGNOSIS — Z803 Family history of malignant neoplasm of breast: Secondary | ICD-10-CM

## 2018-01-14 DIAGNOSIS — N179 Acute kidney failure, unspecified: Principal | ICD-10-CM

## 2018-01-14 DIAGNOSIS — R1900 Intra-abdominal and pelvic swelling, mass and lump, unspecified site: Secondary | ICD-10-CM

## 2018-01-14 DIAGNOSIS — C801 Malignant (primary) neoplasm, unspecified: Secondary | ICD-10-CM

## 2018-01-14 DIAGNOSIS — Z905 Acquired absence of kidney: Secondary | ICD-10-CM

## 2018-01-14 DIAGNOSIS — Z79811 Long term (current) use of aromatase inhibitors: Secondary | ICD-10-CM

## 2018-01-14 HISTORY — PX: IR NEPHROSTOMY PLACEMENT RIGHT: IMG6064

## 2018-01-14 LAB — BASIC METABOLIC PANEL
Anion gap: 14 (ref 5–15)
BUN: 51 mg/dL — AB (ref 8–23)
CO2: 28 mmol/L (ref 22–32)
CREATININE: 3.03 mg/dL — AB (ref 0.44–1.00)
Calcium: 8.9 mg/dL (ref 8.9–10.3)
Chloride: 98 mmol/L (ref 98–111)
GFR, EST AFRICAN AMERICAN: 18 mL/min — AB (ref >60)
GFR, EST NON AFRICAN AMERICAN: 15 mL/min — AB (ref >60)
Glucose, Bld: 99 mg/dL (ref 70–99)
POTASSIUM: 3.2 mmol/L — AB (ref 3.5–5.1)
SODIUM: 140 mmol/L (ref 135–145)

## 2018-01-14 LAB — PROTIME-INR
INR: 1.01
PROTHROMBIN TIME: 13.2 s (ref 11.4–15.2)

## 2018-01-14 LAB — CBC
HCT: 22.8 % — ABNORMAL LOW (ref 36.0–46.0)
Hemoglobin: 7.8 g/dL — ABNORMAL LOW (ref 12.0–15.0)
MCH: 32 pg (ref 26.0–34.0)
MCHC: 34.2 g/dL (ref 30.0–36.0)
MCV: 93.4 fL (ref 78.0–100.0)
PLATELETS: 319 10*3/uL (ref 150–400)
RBC: 2.44 MIL/uL — AB (ref 3.87–5.11)
RDW: 16.2 % — ABNORMAL HIGH (ref 11.5–15.5)
WBC: 3.5 10*3/uL — ABNORMAL LOW (ref 4.0–10.5)

## 2018-01-14 LAB — IRON AND TIBC
Iron: 60 ug/dL (ref 28–170)
SATURATION RATIOS: 20 % (ref 10.4–31.8)
TIBC: 300 ug/dL (ref 250–450)
UIBC: 240 ug/dL

## 2018-01-14 LAB — FERRITIN: Ferritin: 541 ng/mL — ABNORMAL HIGH (ref 11–307)

## 2018-01-14 LAB — LACTIC ACID, PLASMA: LACTIC ACID, VENOUS: 1 mmol/L (ref 0.5–1.9)

## 2018-01-14 MED ORDER — LIDOCAINE HCL 1 % IJ SOLN
INTRAMUSCULAR | Status: AC
  Filled 2018-01-14: qty 10

## 2018-01-14 MED ORDER — PANTOPRAZOLE SODIUM 40 MG PO TBEC
40.0000 mg | DELAYED_RELEASE_TABLET | Freq: Every day | ORAL | Status: DC
  Administered 2018-01-15: 40 mg via ORAL
  Filled 2018-01-14 (×2): qty 1

## 2018-01-14 MED ORDER — RISAQUAD PO CAPS
1.0000 | ORAL_CAPSULE | Freq: Every day | ORAL | Status: DC
  Administered 2018-01-14 – 2018-01-16 (×3): 1 via ORAL
  Filled 2018-01-14 (×3): qty 1

## 2018-01-14 MED ORDER — FENTANYL CITRATE (PF) 100 MCG/2ML IJ SOLN
INTRAMUSCULAR | Status: AC
  Filled 2018-01-14: qty 2

## 2018-01-14 MED ORDER — IOPAMIDOL (ISOVUE-300) INJECTION 61%
INTRAVENOUS | Status: AC
  Administered 2018-01-14: 10 mL
  Filled 2018-01-14: qty 50

## 2018-01-14 MED ORDER — FENTANYL CITRATE (PF) 100 MCG/2ML IJ SOLN
INTRAMUSCULAR | Status: AC | PRN
  Administered 2018-01-14 (×2): 50 ug via INTRAVENOUS

## 2018-01-14 MED ORDER — LIDOCAINE HCL 1 % IJ SOLN
INTRAMUSCULAR | Status: AC
  Filled 2018-01-14: qty 20

## 2018-01-14 MED ORDER — IOPAMIDOL (ISOVUE-300) INJECTION 61%
50.0000 mL | Freq: Once | INTRAVENOUS | Status: AC | PRN
  Administered 2018-01-14: 10 mL

## 2018-01-14 MED ORDER — CEFAZOLIN SODIUM-DEXTROSE 2-4 GM/100ML-% IV SOLN
2.0000 g | Freq: Once | INTRAVENOUS | Status: AC
  Administered 2018-01-14: 2 g via INTRAVENOUS

## 2018-01-14 MED ORDER — CEFAZOLIN SODIUM-DEXTROSE 2-4 GM/100ML-% IV SOLN
INTRAVENOUS | Status: AC
  Administered 2018-01-14: 2 g via INTRAVENOUS
  Filled 2018-01-14: qty 100

## 2018-01-14 MED ORDER — MIDAZOLAM HCL 2 MG/2ML IJ SOLN
INTRAMUSCULAR | Status: AC
  Filled 2018-01-14: qty 4

## 2018-01-14 MED ORDER — MIDAZOLAM HCL 2 MG/2ML IJ SOLN
INTRAMUSCULAR | Status: AC | PRN
  Administered 2018-01-14 (×2): 1 mg via INTRAVENOUS

## 2018-01-14 MED ORDER — POTASSIUM CHLORIDE CRYS ER 20 MEQ PO TBCR
40.0000 meq | EXTENDED_RELEASE_TABLET | Freq: Two times a day (BID) | ORAL | Status: AC
  Administered 2018-01-15: 40 meq via ORAL
  Filled 2018-01-14: qty 2

## 2018-01-14 MED ORDER — ALUM & MAG HYDROXIDE-SIMETH 200-200-20 MG/5ML PO SUSP
30.0000 mL | ORAL | Status: DC | PRN
  Administered 2018-01-14: 30 mL via ORAL
  Filled 2018-01-14: qty 30

## 2018-01-14 NOTE — Progress Notes (Addendum)
Pt is refusing placement of foley cath.  Much education and MD rationale given.  Pt verbalizes understanding but continues to refuse placement.  Is afraid of getting a UTI. Education provided but continues to refuse placement.   At this time her urine output is QS, yellow clear into Purewick and not leaking noted.  Pt requesting that her own thigh high compression hose be placed instead of the hospital hose.  Have placed hers and given education [id number] etc.    Would also like to have the daily Miralax discontinued as her stools are always liquid due to meds that she is on.  And, also, requests an order for probiotics please.

## 2018-01-14 NOTE — Progress Notes (Signed)
[facility] PROGRESS NOTE    Progress Note  Sonia Hill  CHY:[id number] DOB: 1956/03/08 DOA: 01/13/2018 PCP: [name], MD     Brief Narrative:   Sonia Hill is an 62 y.o. 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 Urology was consulted and awaiting recommendations. The right pelvic mass which has shifted which is probably compressing the ureter. Foley is placed continue strict I's and O's.  Possible acute lower UTI Urine cultures have been sent started empirically on Rocephin.  Breast cancer metastasized to bone (HCC)/Carcinomatosis peritonei [facility]): Chemotherapy will need to follow-up with urology as an outpatient.    Protein-calorie malnutrition, severe: Ensure 3 times daily.  Anemia due to antineoplastic chemotherapy: Seems to be stable.  Anxiety: Continue Xanax.  DVT prophylaxis: loveox Family Communication:husband Disposition Plan/Barrier to D/C: home in 2 days Code Status:     Code Status Orders  (From admission, onward)        Start     Ordered   01/13/18 2216  Full code  Continuous     01/13/18 2215    Code Status History    Date Active Date Inactive Code Status Order ID Comments User Context   11/03/2017 2338 11/24/2017 1926 Full Code [id number]  Norval Morton, MD ED   07/10/2017 2026 07/14/2017 1639 Full Code [id number]  Florencia Reasons, MD Inpatient      Advance Directive Documentation     Most Recent Value  Type of Advance Directive  Healthcare Power of [place], Living will  Pre-existing out of facility DNR order (yellow form or pink MOST form)  -  "MOST" Form in Place?  -        IV Access:    Peripheral IV   Procedures and diagnostic studies:   Ct Renal Stone Study  Result Date: 01/13/2018 CLINICAL DATA:  Pt with pelvic pain and hematuria, no hx renal stones, hx bladder spasms Hx left breast ca with mastectomy, left nephrectomy, gastrostomy, colectomy, CKD. 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 EXAM: CT ABDOMEN AND PELVIS WITHOUT CONTRAST TECHNIQUE: Multidetector CT imaging of the abdomen and pelvis was performed following the standard protocol without IV contrast. COMPARISON:  CT of the abdomen and pelvis on 12/01/2017 FINDINGS: Lower chest: Trace RIGHT pleural effusion. Heart size is normal. No pericardial effusion or significant coronary artery calcifications. LEFT mastectomy. Hepatobiliary: The liver is homogeneous. Gallbladder is decompressed. Pancreas: Unremarkable. No pancreatic ductal dilatation or surrounding inflammatory changes. Spleen: Normal in size without focal abnormality. Adrenals/Urinary Tract: LEFT nephrectomy. RIGHT adrenal gland is normal. There is moderate RIGHT hydronephrosis, increased slightly since prior study. The RIGHT proximal ureter is dilated. The distal ureter is likely occluded by pelvic mass and irregular thickening in the posterior bladder wall, LEFT greater than RIGHT. Stomach/Bowel: Gastrostomy tube in place. Marked rectal wall thickening again noted. Thickening of the distal transverse colon proximal to the LEFT  mid abdominal ostomy. Vascular/Lymphatic: Minimal atherosclerotic calcification of the abdominal aorta. Evaluation adenopathy is limited by lack of intravenous  contrast. Reproductive: Poorly defined soft tissue mass within the cul-de-sac and involving the bladder, measuring at least 7.8 x 5.3 centimeters. There is adjacent abnormal thickening/mass of the rectosigmoid colon. Other: Ascites.  Anasarca. Musculoskeletal: No acute or significant osseous findings. IMPRESSION: 1. Persistent significant ascites, consistent with known carcinomatosis. 2. Slight increase in RIGHT hydronephrosis. Pelvic mass is probably stable accounting for differences in technique. 3. Marked thickening of the rectosigmoid colon. Distal transverse colon is mildly thickened proximal to the ostomy. 4. Trace RIGHT pleural effusion. 5. LEFT nephrectomy. Electronically Signed   By: Nolon Nations M.D.   On: 01/13/2018 15:28     Medical Consultants:    None.  Anti-Infectives:   IV Rocephin  Subjective:    Sonia Hill no new complaints.  Objective:    Vitals:   01/13/18 1437 01/13/18 1701 01/13/18 2033 01/14/18 0555  BP: 132/82 140/86 (!) 142/78 136/75  Pulse: 84 94 (!) 107 96  Resp: 18 18 18 16   Temp:   98.2 F (36.8 C) 98.4 F (36.9 C)  TempSrc:   Oral Oral  SpO2: 100% 100% 100% 97%  Weight:      Height:        Intake/Output Summary (Last 24 hours) at 01/14/2018 0752 Last data filed at 01/14/2018 0700 Gross per 24 hour  Intake 1392.74 ml  Output 1100 ml  Net 292.74 ml   Filed Weights   01/13/18 1216  Weight: 47.7 kg (105 lb 3.2 oz)    Exam: General exam: In no acute distress cachectic Respiratory system: Good air movement and clear to auscultation. Cardiovascular system: S1 & S2 heard, RRR.   Gastrointestinal system: Abdomen is nondistended, soft and nontender.  Central nervous system: Alert and oriented. No focal neurological deficits. Extremities: No pedal edema. Skin: No rashes, lesions or ulcers Psychiatry: Judgement and insight appear normal. Mood & affect appropriate.    Data Reviewed:    Labs: Basic Metabolic Panel: Recent Labs  Lab  01/13/18 1316 01/14/18 0132  NA 134* 140  K 3.7 3.2*  CL 93* 98  CO2 27 28  GLUCOSE 97 99  BUN 48* 51*  CREATININE 3.08* 3.03*  CALCIUM 9.5 8.9   GFR Estimated Creatinine Clearance: 14.5 mL/min (A) (by C-G formula based on SCr of 3.03 mg/dL (H)). Liver Function Tests: No results for input(s): AST, ALT, ALKPHOS, BILITOT, PROT, ALBUMIN in the last 168 hours. No results for input(s): LIPASE, AMYLASE in the last 168 hours. No results for input(s): AMMONIA in the last 168 hours. Coagulation profile No results for input(s): INR, PROTIME in the last 168 hours.  CBC: Recent Labs  Lab 01/13/18 1316 01/14/18 0132  WBC 3.5* 3.5*  NEUTROABS 2.3  --   HGB 9.1* 7.8*  HCT 27.1* 22.8*  MCV 94.8 93.4  PLT 367 319   Cardiac Enzymes: No results for input(s): CKTOTAL, CKMB, CKMBINDEX, TROPONINI in the last 168 hours. BNP (last 3 results) No results for input(s): PROBNP in the last 8760 hours. CBG: No results for input(s): GLUCAP in the last 168 hours. D-Dimer: No results for input(s): DDIMER in the last 72 hours. Hgb A1c: No results for input(s): HGBA1C in the last 72 hours. Lipid Profile: No results for input(s): CHOL, HDL, LDLCALC, TRIG, CHOLHDL, LDLDIRECT in the last 72 hours. Thyroid function studies: No results for input(s): TSH, T4TOTAL, T3FREE, THYROIDAB in the last 72 hours.  Invalid input(s): FREET3 Anemia work up: No results for input(s): VITAMINB12, FOLATE, FERRITIN, TIBC, IRON, RETICCTPCT in the last 72 hours. Sepsis Labs: Recent Labs  Lab 01/13/18 1316 01/13/18 2305 01/14/18 0132  WBC 3.5*  --  3.5*  LATICACIDVEN  --  1.4 1.0   Microbiology No results found for this or any previous visit (from the past 240 hour(s)).   Medications:   . cholecalciferol  400 Units Oral Daily  . exemestane  25 mg Oral QPC breakfast  . furosemide  40 mg Oral Daily  . heparin lock flush  250 Units Intravenous Daily  . polyethylene glycol  17 g Oral Daily  . thyroid  75 mg  Oral Daily   Continuous Infusions: . sodium chloride 75 mL/hr at 01/14/18 0700  . cefTRIAXone (ROCEPHIN)  IV Stopped (01/13/18 2339)      LOS: 1 day   Charlynne Cousins  [facility] Pager [phone]  *Please refer to [email or web address], password [email or web address] to get updated schedule on who will round on this patient, as hospitalists switch teams weekly. If 7PM-7AM, please contact night-coverage at [email or web address], password [email or web address] for any overnight needs.  01/14/2018, 7:52 AM

## 2018-01-14 NOTE — Progress Notes (Signed)
IR requested for possible image-guided paracentesis.  Limited abdominal ultrasound revealed no fluid that could safely be accessed with procedure. Images sent to Dr. Laurence Ferrari for review.  Informed patient that procedure will not occur today. All questions answered and concerns addressed. Patient conveys understanding and agrees with plan.  Bea Graff Victor Granados, PA-C 01/14/2018, 12:59 PM

## 2018-01-14 NOTE — Consult Note (Signed)
Chief Complaint: Patient was seen in consultation today for right ureteral obstruction AND hydronephrosis of right kidney.  Referring Physician(s): Aileen Fass, Tammi Klippel  Supervising Physician: Jacqulynn Cadet  Patient Status: [facility] - In-pt  History of Present Illness: Sonia Hill is a 62 y.o. female with a past medical history of hypertension, hyperlipidemia, GERD, SBO, CKD, osteoporosis, arthritis, left breast cancer, and depression. She presented to ED 01/13/2018 with complaint of hematuria. She was found to have right ureteral obstruction AND hydronephrosis of right kidney and was admitted for management.  IR requested by Dr. Aileen Fass for possible image-guided percutaneous right nephrostomy tube placement. Patient awake and alert sitting in bed with no complaints at this time. Accompanied by husband at bedside. Denies fever, chills, chest pain, dyspnea, abdominal pain, dizziness, or headache.   Past Medical History:  Diagnosis Date  . Arthritis    neck, upper back, shoulders - no meds - yoga  . Chronic kidney disease    Only has right kidney  . Depression    Hx - no current problem  . GERD (gastroesophageal reflux disease)    diet controlled - no meds  . Hyperlipidemia    diet controlled  . Hypothyroidism   . lt breast ca dx'd 11/2009  . Osteoporosis, unspecified 12/06/2013  . PONV (postoperative nausea and vomiting)   . SBO (small bowel obstruction) ([place])   . SVD (spontaneous vaginal delivery)    x 1    Past Surgical History:  Procedure Laterality Date  . ABDOMINAL SURGERY    . BLADDER NECK RECONSTRUCTION     made a new urethea tube  . bladder tact  2008  . BOWEL RESECTION N/A 11/08/2017   Procedure: SMALL BOWEL RESECTION;  Surgeon: Michael Boston, MD;  Location: [facility];  Service: General;  Laterality: N/A;  . BREAST SURGERY     masectomy left breast  . coloscopy    . COLOSTOMY    . DILATION AND CURETTAGE OF UTERUS    . GASTROSTOMY N/A 11/08/2017   Procedure: INSERTION OF GASTROSTOMY TUBE;  Surgeon: Michael Boston, MD;  Location: [facility];  Service: General;  Laterality: N/A;  . LAPAROSCOPY N/A 01/24/2013   Procedure: LAPROSCOPY OPERATIVE;  Surgeon: Margarette Asal, MD;  Location: [place] [facility];  Service: Gynecology;  Laterality: N/A;  . LAPAROSCOPY N/A 11/08/2017   Procedure: LAPAROSCOPY DIAGNOSTIC;  Surgeon: Michael Boston, MD;  [facility];  Service: General;  Laterality: N/A;  . LYSIS OF ADHESION N/A 11/08/2017   Procedure: LYSIS OF ADHESION;  Surgeon: Michael Boston, MD;  [facility];  Service: General;  Laterality: N/A;  . NEPHRECTOMY Left    left kidney dysfunction, recurrent pyelonephritis  . PARTIAL COLECTOMY N/A 11/08/2017   Procedure: PARTIAL COLECTOMY WITH NEW COLOSTOMY;  Surgeon: Michael Boston, MD;  [facility]: [facility];  Service: General;  Laterality: N/A;  . SALPINGOOPHORECTOMY Bilateral 01/24/2013   Procedure: SALPINGO OOPHORECTOMY;  Surgeon: Margarette Asal, MD;  Location: [place] [facility];  Service: Gynecology;  Laterality: Bilateral;  . WISDOM TOOTH EXTRACTION      Allergies: Sulfa antibiotics; Adhesive [tape]; Betadine [povidone iodine]; and Mercury  Medications: Prior to Admission medications   Medication Sig Start Date End Date Taking? Authorizing Provider  abemaciclib (VERZENIO) 100 MG tablet Take 1 tablet (100 mg total) by mouth 2 (two) times daily. Swallow tablets whole. Do not chew, crush, or split tablets before swallowing. 11/30/17  Yes Magrinat, Virgie Dad, MD  ALPRAZolam Duanne Moron) 0.25 MG tablet Take 1 tablet (0.25 mg total)  by mouth every 8 (eight) hours as needed. for anxiety 12/26/17  Yes Magrinat, Virgie Dad, MD  CALCIUM PO Take 2 tablets by mouth 2 (two) times daily.   Yes [provider]  cetirizine (ZYRTEC) 10 MG tablet Take 10 mg by mouth daily.   Yes [provider]  cholecalciferol (D-VI-SOL) 400 UNIT/ML LIQD Take 400 Units by mouth daily.   Yes [provider]  D-MANNOSE PO Take 1 tablet by  mouth daily.   Yes [provider]  Digestive Enzymes (DIGESTIVE ENZYME PO) Take 1 tablet by mouth daily.   Yes [provider]  exemestane (AROMASIN) 25 MG tablet Take 1 tablet (25 mg total) by mouth daily after breakfast. 07/05/17  Yes Magrinat, Virgie Dad, MD  furosemide (LASIX) 40 MG tablet Take 1 tablet (40 mg total) by mouth daily. 11/29/17 01/13/18 Yes Magrinat, Virgie Dad, MD  gabapentin (NEURONTIN) 300 MG capsule Take 1 capsule (300 mg total) by mouth 3 (three) times daily as needed. Patient taking differently: Take 300 mg by mouth 3 (three) times daily as needed (nerve pain).  06/28/17  Yes Magrinat, Virgie Dad, MD  Multiple Vitamin (MULTIVITAMIN WITH MINERALS) TABS tablet Take 1 tablet by mouth daily.   Yes [provider]  Omega-3 Fatty Acids (FISH OIL PO) Take 5 mLs by mouth daily.   Yes [provider]  ondansetron (ZOFRAN-ODT) 8 MG disintegrating tablet Take 1 tablet (8 mg total) by mouth every 8 (eight) hours as needed for nausea or vomiting. 06/22/17  Yes Magrinat, Virgie Dad, MD  Probiotic Product (PROBIOTIC DAILY PO) Take 1 tablet by mouth daily.   Yes [provider]  simethicone (MYLICON) 893 MG chewable tablet Chew 125 mg by mouth every 2 (two) hours as needed for flatulence.    Yes [provider]  Thyroid (NATURE-THROID) 81.25 MG TABS Take 81.25 mg by mouth daily.   Yes [provider]  heparin lock flush 100 UNIT/ML SOLN injection Inject 2.5 mLs (250 Units total) into the vein daily for 30 doses. 11/24/17 12/24/17  Alma Friendly, MD     Family History  Problem Relation Age of Onset  . Breast cancer Mother 54  . Breast cancer Maternal Aunt 58  . Breast cancer Other        MGF's sister dx with breast cancer in her 67s    Social History   Socioeconomic History  . Marital status: Married    Spouse name: Not on file  . Number of children: Not on file  . Years of education: Not on file  . Highest education level:  Not on file  Occupational History  . Not on file  Social Needs  . Financial resource strain: Not on file  . Food insecurity:    Worry: Not on file    Inability: Not on file  . Transportation needs:    Medical: Not on file    Non-medical: Not on file  Tobacco Use  . Smoking status: Never Smoker  . Smokeless tobacco: Never Used  Substance and Sexual Activity  . Alcohol use: No  . Drug use: No  . Sexual activity: Yes    Birth control/protection: Post-menopausal  Lifestyle  . Physical activity:    Days per week: Not on file    Minutes per session: Not on file  . Stress: Not on file  Relationships  . Social connections:    Talks on phone: Not on file    Gets together: Not on file  Attends religious service: Not on file    Active member of club or organization: Not on file    Attends meetings of clubs or organizations: Not on file    Relationship status: Not on file  Other Topics Concern  . Not on file  Social History Narrative  . Not on file     Review of Systems: A 12 point ROS discussed and pertinent positives are indicated in the HPI above.  All other systems are negative.  Review of Systems  Constitutional: Negative for chills and fever.  Respiratory: Negative for shortness of breath and wheezing.   Cardiovascular: Negative for chest pain and palpitations.  Gastrointestinal: Negative for abdominal pain.  Genitourinary: Positive for hematuria.  Neurological: Negative for dizziness and headaches.  Psychiatric/Behavioral: Negative for behavioral problems and confusion.    Vital Signs: BP 136/75 (BP Location: Right Arm)   Pulse 96   Temp 98.4 F (36.9 C) (Oral)   Resp 16   Ht 5\' 1"  (1.549 m)   Wt 105 lb 3.2 oz (47.7 kg)   SpO2 97%   BMI 19.88 kg/m   Physical Exam  Constitutional: She is oriented to person, place, and time. She appears well-developed and well-nourished. No distress.  Cardiovascular: Normal rate, regular rhythm and normal heart sounds.  No  murmur heard. Pulmonary/Chest: Effort normal and breath sounds normal. No respiratory distress. She has no wheezes.  Neurological: She is alert and oriented to person, place, and time.  Skin: Skin is warm and dry.  Psychiatric: She has a normal mood and affect. Her behavior is normal. Judgment and thought content normal.  Nursing note and vitals reviewed.    MD Evaluation Airway: WNL Heart: WNL Abdomen: WNL Chest/ Lungs: WNL ASA  Classification: 3 Mallampati/Airway Score: Two   Imaging: Ct Renal Stone Study  Result Date: 01/13/2018 CLINICAL DATA:  Pt with pelvic pain and hematuria, no hx renal stones, hx bladder spasms Hx left breast ca with mastectomy, left nephrectomy, gastrostomy, colectomy, CKD. 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 EXAM: CT ABDOMEN AND PELVIS WITHOUT CONTRAST TECHNIQUE: Multidetector CT imaging of the abdomen and pelvis was performed following the standard protocol without IV contrast. COMPARISON:  CT of the abdomen and pelvis on 12/01/2017 FINDINGS: Lower chest: Trace RIGHT pleural effusion. Heart size is normal. No pericardial effusion or significant coronary artery calcifications. LEFT mastectomy. Hepatobiliary: The liver is homogeneous. Gallbladder is decompressed. Pancreas: Unremarkable. No pancreatic ductal dilatation or surrounding inflammatory changes. Spleen: Normal in size without focal abnormality. Adrenals/Urinary Tract: LEFT nephrectomy. RIGHT adrenal gland is normal. There is moderate RIGHT hydronephrosis, increased slightly since prior study. The RIGHT proximal ureter is dilated. The distal ureter is likely occluded by pelvic mass and irregular thickening in the posterior bladder wall, LEFT greater than RIGHT. Stomach/Bowel: Gastrostomy tube in place. Marked rectal wall thickening again noted. Thickening of the distal  transverse colon proximal to the LEFT mid abdominal ostomy. Vascular/Lymphatic: Minimal atherosclerotic calcification of the abdominal aorta. Evaluation adenopathy is limited by lack of intravenous contrast. Reproductive: Poorly defined soft tissue mass within the cul-de-sac and involving the bladder, measuring at least 7.8 x 5.3 centimeters. There is adjacent abnormal thickening/mass of the rectosigmoid colon. Other: Ascites.  Anasarca. Musculoskeletal: No acute or significant osseous findings. IMPRESSION: 1. Persistent significant ascites, consistent with known carcinomatosis. 2. Slight increase in RIGHT hydronephrosis. Pelvic mass is probably stable accounting for differences in technique.  3. Marked thickening of the rectosigmoid colon. Distal transverse colon is mildly thickened proximal to the ostomy. 4. Trace RIGHT pleural effusion. 5. LEFT nephrectomy. Electronically Signed   By: Nolon Nations M.D.   On: 01/13/2018 15:28    Labs:  CBC: Recent Labs    11/28/17 1421 12/19/17 1201 01/13/18 1316 01/14/18 0132  WBC 14.6* 5.7 3.5* 3.5*  HGB 8.7* 8.9* 9.1* 7.8*  HCT 26.0* 26.2* 27.1* 22.8*  PLT 1,074* 392 367 319    COAGS: Recent Labs    01/14/18 1056  INR 1.01    BMP: Recent Labs    11/28/17 1421 12/19/17 1201 01/13/18 1316 01/14/18 0132  NA 135 138 134* 140  K 3.1* 3.8 3.7 3.2*  CL 97* 97* 93* 98  CO2 26 31 27 28   GLUCOSE 86 108* 97 99  BUN 21* 27* 48* 51*  CALCIUM 9.3 9.9 9.5 8.9  CREATININE 0.67 1.33* 3.08* 3.03*  GFRNONAA >60 42* 15* 15*  GFRAA >60 49* 18* 18*    LIVER FUNCTION TESTS: Recent Labs    11/20/17 0444 11/21/17 0432 11/22/17 0818 12/19/17 1201  BILITOT 1.0 1.0 0.7 0.3  AST 35 39 39 22  ALT 28 31 34 10  ALKPHOS 303* 293* 247* 99  PROT 5.9* 6.1* 6.2* 7.0  ALBUMIN 3.3* 3.0* 2.9* 3.4*    TUMOR MARKERS: No results for input(s): AFPTM, CEA, CA199, CHROMGRNA in the last 8760 hours.  Assessment and Plan:  Right ureteral  obstruction. Hydronephrosis of right kidney. Plan for image-guided percutaneous right nephrostomy tube placement today with Dr. Laurence Ferrari. Patient is NPO. She does not take blood thinners. INR 1.01 seconds today.  Risks and benefits discussed with the patient including bleeding, infection, damage to adjacent structures, fistula connection, and sepsis. All of the patient's questions were answered, patient is agreeable to proceed. Consent signed and in chart.   Thank you for this interesting consult.  I greatly enjoyed meeting Sonia Hill and look forward to participating in their care.  A copy of this report was sent to the requesting provider on this date.  Electronically Signed: Earley Abide, PA-C 01/14/2018, 12:59 PM   I spent a total of 20 Minutes in face to face in clinical consultation, greater than 50% of which was counseling/coordinating care for right ureteral obstruction AND hydronephrosis of right kidney.

## 2018-01-14 NOTE — Consult Note (Signed)
Urology Consult  Referring physician: Dr. Aileen Fass Reason for referral: hydronephrosis, ARF, solitary kidney  Chief Complaint: hematuria  History of Present Illness: [name] is a 62yo with a hx of metastatic breast cancer and solitary kidney who presented to [place] with gross hematuria and was found to have a creatinine of 3.0. Baseline is 0.7. CT scan shows peritoneal carcinomatosis, a large bladder mass and moderate right hydronephrosis. She has a hx of left nephrectomy as a child due to reflux and a right ureteral reimplant by Dr. Serita Butcher for reflux. Hematuria started yesterday. She denies any worsening LUTS. She does wear pads. No exacerbating/alleviating events. No other associated symptoms/  Past Medical History:  Diagnosis Date  . Arthritis    neck, upper back, shoulders - no meds - yoga  . Chronic kidney disease    Only has right kidney  . Depression    Hx - no current problem  . GERD (gastroesophageal reflux disease)    diet controlled - no meds  . Hyperlipidemia    diet controlled  . Hypothyroidism   . lt breast ca dx'd 11/2009  . Osteoporosis, unspecified 12/06/2013  . PONV (postoperative nausea and vomiting)   . SBO (small bowel obstruction) ([place])   . SVD (spontaneous vaginal delivery)    x 1   Past Surgical History:  Procedure Laterality Date  . ABDOMINAL SURGERY    . BLADDER NECK RECONSTRUCTION     made a new urethea tube  . bladder tact  2008  . BOWEL RESECTION N/A 11/08/2017   Procedure: SMALL BOWEL RESECTION;  Surgeon: Michael Boston, MD;  [facility];  Service: General;  Laterality: N/A;  . BREAST SURGERY     masectomy left breast  . coloscopy    . COLOSTOMY    . DILATION AND CURETTAGE OF UTERUS    . GASTROSTOMY N/A 11/08/2017   Procedure: INSERTION OF GASTROSTOMY TUBE;  Surgeon: Michael Boston, MD;  [facility];  Service: General;  Laterality: N/A;  . LAPAROSCOPY N/A 01/24/2013   Procedure: LAPROSCOPY OPERATIVE;  Surgeon: Margarette Asal, MD;  Location: [facility];  Service: Gynecology;  Laterality: N/A;  . LAPAROSCOPY N/A 11/08/2017   Procedure: LAPAROSCOPY DIAGNOSTIC;  Surgeon: Michael Boston, MD;  [facility];  Service: General;  Laterality: N/A;  . LYSIS OF ADHESION N/A 11/08/2017   Procedure: LYSIS OF ADHESION;  Surgeon: Michael Boston, MD;  [facility];  Service: General;  Laterality: N/A;  . NEPHRECTOMY Left    left kidney dysfunction, recurrent pyelonephritis  . PARTIAL COLECTOMY N/A 11/08/2017   Procedure: PARTIAL COLECTOMY WITH NEW COLOSTOMY;  Surgeon: Michael Boston, MD;  [facility];  Service: General;  Laterality: N/A;  . SALPINGOOPHORECTOMY Bilateral 01/24/2013   Procedure: SALPINGO OOPHORECTOMY;  Surgeon: Margarette Asal, MD;  Location: [facility];  Service: Gynecology;  Laterality: Bilateral;  . WISDOM TOOTH EXTRACTION      Medications: I have reviewed the patient's current medications. Allergies:  Allergies  Allergen Reactions  . Sulfa Antibiotics Rash  . Adhesive [Tape] Rash    Ok to use paper tape and tegaderm over IV site  . Betadine [Povidone Iodine] Rash  . Mercury Rash    Reaction mercurachrome    Family History  Problem Relation Age of Onset  . Breast cancer Mother 51  . Breast cancer Maternal Aunt 58  . Breast cancer Other        MGF's sister dx with breast cancer in her 76s  Social History:  reports that she has never smoked. She has never used smokeless tobacco. She reports that she does not drink alcohol or use drugs.  Review of Systems  Constitutional: Positive for malaise/fatigue and weight loss.  Genitourinary: Positive for hematuria.  All other systems reviewed and are negative.   Physical Exam:  Vital signs in last 24 hours: Temp:  [98.2 F (36.8 C)-98.4 F (36.9 C)] 98.4 F (36.9 C) (08/04 0555) Pulse Rate:  [84-107] 96 (08/04 0555) Resp:  [16-18] 16 (08/04 0555) BP: (132-161)/(75-88) 136/75 (08/04 0555) SpO2:  [97 %-100 %] 97 % (08/04 0555) Weight:   [47.7 kg (105 lb 3.2 oz)] 47.7 kg (105 lb 3.2 oz) (08/03 1216) Physical Exam  Constitutional: She is oriented to person, place, and time. She appears well-developed and well-nourished.  HENT:  Head: Normocephalic and atraumatic.  Eyes: Pupils are equal, round, and reactive to light. EOM are normal.  Neck: Normal range of motion. No thyromegaly present.  Cardiovascular: Normal rate and regular rhythm.  Respiratory: Effort normal. No respiratory distress.  GI: Soft. She exhibits distension.  Musculoskeletal: Normal range of motion. She exhibits edema.  Neurological: She is alert and oriented to person, place, and time.  Skin: Skin is warm and dry.  Psychiatric: She has a normal mood and affect. Her behavior is normal. Judgment and thought content normal.    Laboratory Data:  Results for orders placed or performed during the hospital encounter of 01/13/18 (from the past 72 hour(s))  Urinalysis, Routine w reflex microscopic- may I&O cath if menses     Status: Abnormal   Collection Time: 01/13/18 12:26 PM  Result Value Ref Range   Color, Urine YELLOW YELLOW   APPearance CLEAR CLEAR   Specific Gravity, Urine <1.005 (L) 1.005 - 1.030   pH 6.0 5.0 - 8.0   Glucose, UA NEGATIVE NEGATIVE mg/dL   Hgb urine dipstick LARGE (A) NEGATIVE   Bilirubin Urine NEGATIVE NEGATIVE   Ketones, ur NEGATIVE NEGATIVE mg/dL   Protein, ur 100 (A) NEGATIVE mg/dL   Nitrite NEGATIVE NEGATIVE   Leukocytes, UA NEGATIVE NEGATIVE    Comment: Performed at [facility], [address] [place], [place] [address]  Urinalysis, Microscopic (reflex)     Status: Abnormal   Collection Time: 01/13/18 12:26 PM  Result Value Ref Range   RBC / HPF 11-20 0 - 5 RBC/hpf   WBC, UA 0-5 0 - 5 WBC/hpf   Bacteria, UA FEW (A) NONE SEEN   Squamous Epithelial / LPF 0-5 0 - 5    Comment: Performed at [facility], [address] [place], [place] [address]  Basic metabolic panel     Status: Abnormal   Collection  Time: 01/13/18  1:16 PM  Result Value Ref Range   Sodium 134 (L) 135 - 145 mmol/L   Potassium 3.7 3.5 - 5.1 mmol/L   Chloride 93 (L) 98 - 111 mmol/L   CO2 27 22 - 32 mmol/L   Glucose, Bld 97 70 - 99 mg/dL   BUN 48 (H) 8 - 23 mg/dL   Creatinine, Ser 3.08 (H) 0.44 - 1.00 mg/dL   Calcium 9.5 8.9 - 10.3 mg/dL   GFR calc non Af Amer 15 (L) >60 mL/min   GFR calc Af Amer 18 (L) >60 mL/min    Comment: (NOTE) The eGFR has been calculated using the CKD EPI equation. This calculation has not been validated in all clinical situations. eGFR's persistently <60 mL/min signify  possible Chronic Kidney Disease.    Anion gap 14 5 - 15    Comment: Performed at [facility], [address] [place], [place] [address]  CBC with Differential     Status: Abnormal   Collection Time: 01/13/18  1:16 PM  Result Value Ref Range   WBC 3.5 (L) 4.0 - 10.5 K/uL   RBC 2.86 (L) 3.87 - 5.11 MIL/uL   Hemoglobin 9.1 (L) 12.0 - 15.0 g/dL   HCT 27.1 (L) 36.0 - 46.0 %   MCV 94.8 78.0 - 100.0 fL   MCH 31.8 26.0 - 34.0 pg   MCHC 33.6 30.0 - 36.0 g/dL   RDW 15.9 (H) 11.5 - 15.5 %   Platelets 367 150 - 400 K/uL   Neutrophils Relative % 66 %   Neutro Abs 2.3 1.7 - 7.7 K/uL   Lymphocytes Relative 24 %   Lymphs Abs 0.8 0.7 - 4.0 K/uL   Monocytes Relative 7 %   Monocytes Absolute 0.3 0.1 - 1.0 K/uL   Eosinophils Relative 2 %   Eosinophils Absolute 0.1 0.0 - 0.7 K/uL   Basophils Relative 1 %   Basophils Absolute 0.0 0.0 - 0.1 K/uL    Comment: Performed at [facility], [address] [place], [place] [address]  Lactic acid, plasma     Status: None   Collection Time: 01/13/18 11:05 PM  Result Value Ref Range   Lactic Acid, Venous 1.4 0.5 - 1.9 mmol/L    Comment: Performed at [facility], [place] [address] [place], [place] [address]  CBC     Status: Abnormal   Collection Time: 01/14/18  1:32 AM  Result Value Ref Range   WBC 3.5 (L) 4.0 - 10.5 K/uL   RBC 2.44 (L) 3.87 - 5.11  MIL/uL   Hemoglobin 7.8 (L) 12.0 - 15.0 g/dL   HCT 22.8 (L) 36.0 - 46.0 %   MCV 93.4 78.0 - 100.0 fL   MCH 32.0 26.0 - 34.0 pg   MCHC 34.2 30.0 - 36.0 g/dL   RDW 16.2 (H) 11.5 - 15.5 %   Platelets 319 150 - 400 K/uL    Comment: Performed at [facility], [place] [address], [place], [place] [address]  Basic metabolic panel     Status: Abnormal   Collection Time: 01/14/18  1:32 AM  Result Value Ref Range   Sodium 140 135 - 145 mmol/L   Potassium 3.2 (L) 3.5 - 5.1 mmol/L   Chloride 98 98 - 111 mmol/L   CO2 28 22 - 32 mmol/L   Glucose, Bld 99 70 - 99 mg/dL   BUN 51 (H) 8 - 23 mg/dL   Creatinine, Ser 3.03 (H) 0.44 - 1.00 mg/dL   Calcium 8.9 8.9 - 10.3 mg/dL   GFR calc non Af Amer 15 (L) >60 mL/min   GFR calc Af Amer 18 (L) >60 mL/min    Comment: (NOTE) The eGFR has been calculated using the CKD EPI equation. This calculation has not been validated in all clinical situations. eGFR's persistently <60 mL/min signify possible Chronic Kidney Disease.    Anion gap 14 5 - 15    Comment: Performed at [facility], [place] [address], [place], [place] [address]  Lactic acid, plasma     Status: None   Collection Time: 01/14/18  1:32 AM  Result Value Ref Range   Lactic Acid, Venous 1.0 0.5 - 1.9 mmol/L    Comment:  Performed at [facility], [place] [address], [place], [place] [address]   No results found for this or any previous visit (from the past 240 hour(s)). Creatinine: Recent Labs    01/13/18 1316 01/14/18 0132  CREATININE 3.08* 3.03*   Baseline Creatinine: 0.7  Impression/Assessment:  61yo with right malignant ureteral obstruction, gross hematuria  Plan:  1. Right ureteral obstruction: I discussed the management of her ureteral obstruction with the patient and her husband. Given her large bladder mass, complex ureteral anatomy due to hx of ureteral reimplant, and the large mass compressing her right ureter, ureteral stent  placement would not be feasible. The patient would benefit from right nephrostomy tube placement. Please contact IR for right neph tube placement.  2. Gross hematuria: We discussed the various causes including malignancy and infection. We will continue broad spectrum antibiotics pending urine culture  Nicolette Bang 01/14/2018, 10:19 AM

## 2018-01-14 NOTE — Procedures (Signed)
Interventional Radiology Procedure Note  Procedure: Placement of right 49F PCN.  Urine is clear.   Complications: None  Estimated Blood Loss: None  Recommendations: - Tube to bag drainage - Follow Cr - Return to IR in 6-8 weeks for tube check/change  Signed,  Criselda Peaches, MD

## 2018-01-14 NOTE — Progress Notes (Signed)
Rx Brief note: Abemaciclib (Verzenio)  Per P&T policy will hold if active infection.  Patient has been admitted with UTI, pyelonephritis. Will d/c off MAR per policy.   Thanks Lawana Pai R 01/14/2018,12:22 AM

## 2018-01-14 NOTE — Consult Note (Signed)
Referral MD  Reason for Referral: Metastatic breast cancer, hematuria  Chief Complaint  Patient presents with  . Hematuria  : I have blood in my urine.  I have only one kidney.  HPI: Ms. Din is a very charming 62 year old white female.  She has a history of metastatic breast cancer.  She apparently had been doing quite well with this until recently.  She required colon surgery back in June.  She had a colostomy.  Looks like she had carcinomatosis.  She and her husband were to go to [place] tomorrow.  However, she ended up in the emergency room yesterday with hematuria.  She only has one kidney.  She has some chronic renal insufficiency with a creatinine of 3.1.  She had a CT scan done yesterday.  This showed persistent ascites.  She had increasing right hydronephrosis.  Pelvic mass was somewhat stable in size.  This morning, her urine is nice and clear.  She has had no fever.  As far as her metastatic breast cancer is concerned, she is on Verzenio and Aromasin.  Her last CA-27-29 month ago was 478.  She is really not complaining of any pain.  Her colostomy is working.  She says she had 4 bowel movements yesterday.  She is had no cough or shortness of breath.  There is been some slight leg swelling.  She has compression stockings on both legs.  Overall, her performance status is ECOG 1.    Past Medical History:  Diagnosis Date  . Arthritis    neck, upper back, shoulders - no meds - yoga  . Chronic kidney disease    Only has right kidney  . Depression    Hx - no current problem  . GERD (gastroesophageal reflux disease)    diet controlled - no meds  . Hyperlipidemia    diet controlled  . Hypothyroidism   . lt breast ca dx'd 11/2009  . Osteoporosis, unspecified 12/06/2013  . PONV (postoperative nausea and vomiting)   . SBO (small bowel obstruction) ([place])   . SVD (spontaneous vaginal delivery)    x 1  :  Past Surgical History:  Procedure Laterality Date  . ABDOMINAL  SURGERY    . BLADDER NECK RECONSTRUCTION     made a new urethea tube  . bladder tact  2008  . BOWEL RESECTION N/A 11/08/2017   Procedure: SMALL BOWEL RESECTION;  Surgeon: Michael Boston, MD;  Location: [facility];  Service: General;  Laterality: N/A;  . BREAST SURGERY     masectomy left breast  . coloscopy    . COLOSTOMY    . DILATION AND CURETTAGE OF UTERUS    . GASTROSTOMY N/A 11/08/2017   Procedure: INSERTION OF GASTROSTOMY TUBE;  Surgeon: Michael Boston, MD;  Location: [facility];  Service: General;  Laterality: N/A;  . LAPAROSCOPY N/A 01/24/2013   Procedure: LAPROSCOPY OPERATIVE;  Surgeon: Margarette Asal, MD;  Location: [facility];  Service: Gynecology;  Laterality: N/A;  . LAPAROSCOPY N/A 11/08/2017   Procedure: LAPAROSCOPY DIAGNOSTIC;  Surgeon: Michael Boston, MD;  Location: [facility];  Service: General;  Laterality: N/A;  . LYSIS OF ADHESION N/A 11/08/2017   Procedure: LYSIS OF ADHESION;  Surgeon: Michael Boston, MD;  Location: [facility];  Service: General;  Laterality: N/A;  . NEPHRECTOMY Left    left kidney dysfunction, recurrent pyelonephritis  . PARTIAL COLECTOMY N/A 11/08/2017   Procedure: PARTIAL COLECTOMY WITH NEW COLOSTOMY;  Surgeon: Michael Boston, MD;  Location: [facility];  Service: General;  Laterality: N/A;  . SALPINGOOPHORECTOMY Bilateral 01/24/2013   Procedure: SALPINGO OOPHORECTOMY;  Surgeon: Margarette Asal, MD;  Location: [facility];  Service: Gynecology;  Laterality: Bilateral;  . WISDOM TOOTH EXTRACTION    :   Current Facility-Administered Medications:  .  0.9 %  sodium chloride infusion, , Intravenous, Continuous, Hijazi, Ali, MD, Last Rate: 75 mL/hr at 01/14/18 0700 .  acetaminophen (TYLENOL) tablet 650 mg, 650 mg, Oral, Q6H PRN **OR** acetaminophen (TYLENOL) suppository 650 mg, 650 mg, Rectal, Q6H PRN, Merton Border, MD .  ALPRAZolam Duanne Moron) tablet 0.25 mg, 0.25 mg, Oral, TID PRN, Merton Border, MD, 0.25 mg at 01/13/18 2253 .  cefTRIAXone (ROCEPHIN) 2 g in sodium chloride 0.9 % 100 mL  IVPB, 2 g, Intravenous, Q24H, Shade, Haze Justin, RPH, Stopped at 01/13/18 2339 .  cholecalciferol (VITAMIN D) tablet 400 Units, 400 Units, Oral, Daily, Merton Border, MD, 400 Units at 01/14/18 0855 .  exemestane (AROMASIN) tablet 25 mg, 25 mg, Oral, QPC breakfast, Merton Border, MD, 25 mg at 01/14/18 0854 .  furosemide (LASIX) tablet 40 mg, 40 mg, Oral, Daily, Merton Border, MD, 40 mg at 01/14/18 0854 .  gabapentin (NEURONTIN) capsule 300 mg, 300 mg, Oral, TID PRN, Merton Border, MD, 300 mg at 01/14/18 0858 .  heparin lock flush 100 unit/mL, 250 Units, Intravenous, Daily, Hijazi, Ali, MD .  HYDROcodone-acetaminophen (NORCO/VICODIN) 5-325 MG per tablet 1-2 tablet, 1-2 tablet, Oral, Q4H PRN, Merton Border, MD, 2 tablet at 01/13/18 2253 .  ondansetron (ZOFRAN) tablet 4 mg, 4 mg, Oral, Q6H PRN **OR** ondansetron (ZOFRAN) injection 4 mg, 4 mg, Intravenous, Q6H PRN, Hijazi, Ali, MD .  polyethylene glycol (MIRALAX / GLYCOLAX) packet 17 g, 17 g, Oral, Daily, Hijazi, Ali, MD .  simethicone (MYLICON) chewable tablet 120 mg, 120 mg, Oral, Q2H PRN, Merton Border, MD .  thyroid (ARMOUR) tablet 75 mg, 75 mg, Oral, Daily, Merton Border, MD, 75 mg at 01/14/18 0856:  . cholecalciferol  400 Units Oral Daily  . exemestane  25 mg Oral QPC breakfast  . furosemide  40 mg Oral Daily  . heparin lock flush  250 Units Intravenous Daily  . polyethylene glycol  17 g Oral Daily  . thyroid  75 mg Oral Daily  :  Allergies  Allergen Reactions  . Sulfa Antibiotics Rash  . Adhesive [Tape] Rash    Ok to use paper tape and tegaderm over IV site  . Betadine [Povidone Iodine] Rash  . Mercury Rash    Reaction mercurachrome  :  Family History  Problem Relation Age of Onset  . Breast cancer Mother 26  . Breast cancer Maternal Aunt 58  . Breast cancer Other        MGF's sister dx with breast cancer in her 79s  :  Social History   Socioeconomic History  . Marital status: Married    Spouse name: Not on file  . Number of children:  Not on file  . Years of education: Not on file  . Highest education level: Not on file  Occupational History  . Not on file  Social Needs  . Financial resource strain: Not on file  . Food insecurity:    Worry: Not on file    Inability: Not on file  . Transportation needs:    Medical: Not on file    Non-medical: Not on file  Tobacco Use  . Smoking status: Never Smoker  . Smokeless tobacco: Never Used  Substance and Sexual Activity  . Alcohol use: No  .  Drug use: No  . Sexual activity: Yes    Birth control/protection: Post-menopausal  Lifestyle  . Physical activity:    Days per week: Not on file    Minutes per session: Not on file  . Stress: Not on file  Relationships  . Social connections:    Talks on phone: Not on file    Gets together: Not on file    Attends religious service: Not on file    Active member of club or organization: Not on file    Attends meetings of clubs or organizations: Not on file    Relationship status: Not on file  . Intimate partner violence:    Fear of current or ex partner: Not on file    Emotionally abused: Not on file    Physically abused: Not on file    Forced sexual activity: Not on file  Other Topics Concern  . Not on file  Social History Narrative  . Not on file  :  Pertinent items are noted in HPI.  Exam: Patient Vitals for the past 24 hrs:  BP Temp Temp src Pulse Resp SpO2 Height Weight  01/14/18 0555 136/75 98.4 F (36.9 C) Oral 96 16 97 % - -  01/13/18 2033 (!) 142/78 98.2 F (36.8 C) Oral (!) 107 18 100 % - -  01/13/18 1701 140/86 - - 94 18 100 % - -  01/13/18 1437 132/82 - - 84 18 100 % - -  01/13/18 1217 (!) 161/88 98.2 F (36.8 C) Oral 98 18 100 % - -  01/13/18 1216 - - - - - - 5\' 1"  (1.549 m) 105 lb 3.2 oz (47.7 kg)     Recent Labs    01/13/18 1316 01/14/18 0132  WBC 3.5* 3.5*  HGB 9.1* 7.8*  HCT 27.1* 22.8*  PLT 367 319   Recent Labs    01/13/18 1316 01/14/18 0132  NA 134* 140  K 3.7 3.2*  CL 93* 98   CO2 27 28  GLUCOSE 97 99  BUN 48* 51*  CREATININE 3.08* 3.03*  CALCIUM 9.5 8.9    Blood smear review: None  Pathology: None    Assessment and Plan: Ms. Lady is a very nice 62 year old white female.  Her birthday is actually this [birth year].  I guess they were probably going to why for her birthday.  She is now admitted.  She had hematuria.  This appears to be cleared up..  She does have worse hydronephrosis.  Her creatinine is 3.  I wonder if a stent could be placed.  I would think that neurology will see her today and make a decision.  She only has one kidney so I would think that we need to try to do what we can do preserve his function.  She is quite anemic.  I suspect that she is iron deficient.  I suspect that she also has a low erythropoietin level.  I would hate to transfuse her.  I would think though that if her hemoglobin lowers, then a transfusion might be needed.  I would check iron studies on her.  She also has ascites.  This might need to be taken off.  I will know if this might help with the hydronephrosis.  Overall, she looks like she is in pretty decent shape.  As always, Dr. [name] has done a fantastic job taking care of her.  I know this is somewhat challenging right now as it appears that her tumor is becoming more  aggressive and possibly becoming resilient to antiestrogen therapy.  We will follow along and try to help out any way that we can.  I know that the nursing staff on [address] we will do a fantastic job taking care of her.  Lattie Haw, MD  Artis Flock 29:11

## 2018-01-15 DIAGNOSIS — N183 Chronic kidney disease, stage 3 unspecified: Secondary | ICD-10-CM

## 2018-01-15 DIAGNOSIS — N189 Chronic kidney disease, unspecified: Secondary | ICD-10-CM

## 2018-01-15 DIAGNOSIS — Z7189 Other specified counseling: Secondary | ICD-10-CM

## 2018-01-15 DIAGNOSIS — C50811 Malignant neoplasm of overlapping sites of right female breast: Secondary | ICD-10-CM

## 2018-01-15 DIAGNOSIS — C7951 Secondary malignant neoplasm of bone: Secondary | ICD-10-CM

## 2018-01-15 DIAGNOSIS — Z90722 Acquired absence of ovaries, bilateral: Secondary | ICD-10-CM

## 2018-01-15 DIAGNOSIS — Z515 Encounter for palliative care: Secondary | ICD-10-CM

## 2018-01-15 LAB — URINE CULTURE

## 2018-01-15 LAB — BASIC METABOLIC PANEL
ANION GAP: 12 (ref 5–15)
BUN: 46 mg/dL — ABNORMAL HIGH (ref 8–23)
CALCIUM: 9 mg/dL (ref 8.9–10.3)
CHLORIDE: 99 mmol/L (ref 98–111)
CO2: 31 mmol/L (ref 22–32)
CREATININE: 3.11 mg/dL — AB (ref 0.44–1.00)
GFR, EST AFRICAN AMERICAN: 17 mL/min — AB (ref >60)
GFR, EST NON AFRICAN AMERICAN: 15 mL/min — AB (ref >60)
GLUCOSE: 114 mg/dL — AB (ref 70–99)
POTASSIUM: 3.7 mmol/L (ref 3.5–5.1)
Sodium: 142 mmol/L (ref 135–145)

## 2018-01-15 LAB — TYPE AND SCREEN
ABO/RH(D): O POS
ANTIBODY SCREEN: NEGATIVE

## 2018-01-15 LAB — CBC
HCT: 24.9 % — ABNORMAL LOW (ref 36.0–46.0)
HEMOGLOBIN: 8.4 g/dL — AB (ref 12.0–15.0)
MCH: 31.8 pg (ref 26.0–34.0)
MCHC: 33.7 g/dL (ref 30.0–36.0)
MCV: 94.3 fL (ref 78.0–100.0)
Platelets: 350 10*3/uL (ref 150–400)
RBC: 2.64 MIL/uL — ABNORMAL LOW (ref 3.87–5.11)
RDW: 16.2 % — ABNORMAL HIGH (ref 11.5–15.5)
WBC: 3.9 10*3/uL — ABNORMAL LOW (ref 4.0–10.5)

## 2018-01-15 MED ORDER — PANTOPRAZOLE SODIUM 40 MG PO TBEC
40.0000 mg | DELAYED_RELEASE_TABLET | Freq: Two times a day (BID) | ORAL | Status: DC
  Administered 2018-01-15 – 2018-01-16 (×3): 40 mg via ORAL
  Filled 2018-01-15 (×2): qty 1

## 2018-01-15 MED ORDER — GABAPENTIN 300 MG PO CAPS
300.0000 mg | ORAL_CAPSULE | Freq: Three times a day (TID) | ORAL | Status: DC
  Administered 2018-01-15 – 2018-01-16 (×3): 300 mg via ORAL
  Filled 2018-01-15 (×3): qty 1

## 2018-01-15 MED ORDER — POLYETHYLENE GLYCOL 3350 17 G PO PACK
17.0000 g | PACK | Freq: Every day | ORAL | Status: DC
  Administered 2018-01-15 – 2018-01-16 (×2): 17 g via ORAL
  Filled 2018-01-15 (×2): qty 1

## 2018-01-15 NOTE — Progress Notes (Addendum)
[facility] PROGRESS NOTE    Progress Note  Sonia Hill  SWH:[id number] DOB: 03-May-1956 DOA: 01/13/2018 PCP: [name], MD     Brief Narrative:   Sonia Hill is an 63 y.o. 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 Urology was consulted recommended a nephrostomy tube placed on 01/14/2018. There is a mild rise in creatinine, will start on IV fluids recheck tomorrow morning. Foley is placed continue strict I's and O's.  Possible acute lower UTI Culture showed less than 10,000 colonies discontinue IV antibiotics.  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    Protein-calorie malnutrition, severe: Ensure 3 times daily.  Anemia due to antineoplastic chemotherapy: Seems to be stable.  Anxiety: Continue Xanax.  DVT prophylaxis: loveox Family Communication:husband Disposition Plan/Barrier to D/C: home in 1 days Code Status:     Code Status Orders  (From admission, onward)        Start     Ordered   01/13/18 2216  Full code  Continuous     01/13/18 2215    Code Status History    Date Active Date Inactive Code Status Order ID Comments User Context   11/03/2017 2338 11/24/2017 1926 Full Code [id number]  Norval Morton, MD ED   07/10/2017 2026 07/14/2017 1639 Full Code  [id number]  Florencia Reasons, MD Inpatient    Advance Directive Documentation     Most Recent Value  Type of Advance Directive  Healthcare Power of [place], Living will  Pre-existing out of facility DNR order (yellow form or pink MOST form)  -  "MOST" Form in Place?  -        IV Access:    Peripheral IV   Procedures and diagnostic studies:   Ct Renal Stone Study  Result Date: 01/13/2018 CLINICAL DATA:  Pt with pelvic pain and hematuria, no hx renal stones, hx bladder spasms Hx left breast ca with mastectomy, left nephrectomy, gastrostomy, colectomy, CKD. 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 EXAM: CT ABDOMEN AND PELVIS WITHOUT CONTRAST TECHNIQUE: Multidetector CT imaging of the abdomen and pelvis was performed following the standard protocol without IV contrast. COMPARISON:  CT of the abdomen and pelvis on 12/01/2017 FINDINGS: Lower chest: Trace RIGHT pleural effusion. Heart size is normal. No pericardial effusion or significant coronary artery calcifications. LEFT mastectomy. Hepatobiliary: The liver is homogeneous. Gallbladder is decompressed. Pancreas: Unremarkable. No pancreatic ductal dilatation or surrounding inflammatory changes. Spleen: Normal in size without focal abnormality. Adrenals/Urinary Tract: LEFT nephrectomy. RIGHT adrenal gland is normal. There is moderate RIGHT hydronephrosis, increased slightly since prior study. The RIGHT proximal ureter is dilated. The distal ureter is likely occluded by pelvic mass and irregular thickening in the posterior bladder wall, LEFT greater than RIGHT. Stomach/Bowel: Gastrostomy tube in place. Marked rectal wall thickening again noted. Thickening of the  distal transverse colon proximal to the LEFT mid abdominal ostomy. Vascular/Lymphatic: Minimal atherosclerotic calcification of the abdominal aorta. Evaluation  adenopathy is limited by lack of intravenous contrast. Reproductive: Poorly defined soft tissue mass within the cul-de-sac and involving the bladder, measuring at least 7.8 x 5.3 centimeters. There is adjacent abnormal thickening/mass of the rectosigmoid colon. Other: Ascites.  Anasarca. Musculoskeletal: No acute or significant osseous findings. IMPRESSION: 1. Persistent significant ascites, consistent with known carcinomatosis. 2. Slight increase in RIGHT hydronephrosis. Pelvic mass is probably stable accounting for differences in technique. 3. Marked thickening of the rectosigmoid colon. Distal transverse colon is mildly thickened proximal to the ostomy. 4. Trace RIGHT pleural effusion. 5. LEFT nephrectomy. Electronically Signed   By: Nolon Nations M.D.   On: 01/13/2018 15:28   [place] Ascites (abdomen Limited)  Result Date: 01/14/2018 CLINICAL DATA:  62 year old female with metastatic breast cancer and growing abdominal distension. Evaluate for ascites. EXAM: LIMITED ABDOMEN ULTRASOUND FOR ASCITES TECHNIQUE: Limited ultrasound survey for ascites was performed in all four abdominal quadrants. COMPARISON:  None. FINDINGS: Sonographic interrogation of the 4 quadrants of the abdomen demonstrates only trace ascites. Paracentesis was deferred. IMPRESSION: Minimal ascites, insufficient for paracentesis. Increase in abdominal distension appears to be secondary to increasing peritoneal carcinomatosis rather than ascitic fluid. Electronically Signed   By: Jacqulynn Cadet M.D.   On: 01/14/2018 13:57   Ir Nephrostomy Placement Right  Result Date: 01/14/2018 INDICATION: 62 year old female with a solitary right kidney and obstructed hydronephrosis secondary to progressive metastatic breast cancer in the pelvis. She has acute kidney injury and requires urgent percutaneous nephrostomy tube placement for renal decompression and to preserve renal function. EXAM: IR NEPHROSTOMY PLACEMENT RIGHT COMPARISON:  None. MEDICATIONS:  2 g Ancef; The antibiotic was administered in an appropriate time frame prior to skin puncture. ANESTHESIA/SEDATION: Fentanyl 100 mcg IV; Versed 2 mg IV Moderate Sedation Time:  10 minutes The patient was continuously monitored during the procedure by the interventional radiology nurse under my direct supervision. CONTRAST:  10 mL Isovue-300-administered into the collecting system(s) FLUOROSCOPY TIME:  Fluoroscopy Time: 0 minutes 36 seconds (3 mGy). COMPLICATIONS: None immediate. TECHNIQUE: The procedure, risks, benefits, and alternatives were explained to the patient. Questions regarding the procedure were encouraged and answered. The patient understands and consents to the procedure. The right flank was prepped with chlorhexidine in a sterile fashion, and a sterile drape was applied covering the operative field. A sterile gown and sterile gloves were used for the procedure. Local anesthesia was provided with 1% Lidocaine. The right flank was interrogated with ultrasound and the left kidney identified. The kidney is hydronephrotic. A suitable access site on the skin overlying the lower pole, posterior calix was identified. After local mg anesthesia was achieved, a small skin nick was made with an 11 blade scalpel. A 21 gauge Accustick needle was then advanced under direct sonographic guidance into the lower pole of the right kidney. A 0.018 inch wire was advanced under fluoroscopic guidance into the left renal collecting system. The Accustick sheath was then advanced over the wire and a 0.018 system exchanged for a 0.035 system. Gentle hand injection of contrast material confirms placement of the sheath within the renal collecting system. There is moderate hydronephrosis. The tract from the scan into the renal collecting system was then dilated serially to 10-French. A 10-French Cook all-purpose drain was then placed and positioned under fluoroscopic guidance. The locking loop is well formed within the left renal  pelvis. The catheter was secured to the skin  with 2-0 Prolene and a sterile bandage was placed. Catheter was left to gravity bag drainage. IMPRESSION: Successful placement of a right 10 French percutaneous nephrostomy tube. Electronically Signed   By: Jacqulynn Cadet M.D.   On: 01/14/2018 14:42     Medical Consultants:    None.  Anti-Infectives:   IV Rocephin  Subjective:   Sonia Hill no new complaints.  Objective:    Vitals:   01/14/18 1727 01/14/18 2105 01/14/18 2209 01/15/18 0612  BP: (!) 149/80 (!) 180/89 (!) 145/74 125/69  Pulse: 87 95 96 82  Resp: 12 18  18   Temp: 97.7 F (36.5 C) 98.6 F (37 C)  97.9 F (36.6 C)  TempSrc: Oral Oral  Oral  SpO2: 100% 100%  100%  Weight:      Height:        Intake/Output Summary (Last 24 hours) at 01/15/2018 0839 Last data filed at 01/15/2018 0600 Gross per 24 hour  Intake 1634.93 ml  Output 3250 ml  Net -1615.07 ml   Filed Weights   01/13/18 1216  Weight: 47.7 kg (105 lb 3.2 oz)    Exam: General exam: In no acute distress cachectic Respiratory system: Good air movement and clear to auscultation. Cardiovascular system: S1 & S2 heard, RRR.   Gastrointestinal system: Abdomen is nondistended, soft and nontender.  Central nervous system: Alert and oriented. No focal neurological deficits. Extremities: No pedal edema. Skin: No rashes, lesions or ulcers Psychiatry: Judgement and insight appear normal. Mood & affect appropriate.    Data Reviewed:    Labs: Basic Metabolic Panel: Recent Labs  Lab 01/13/18 1316 01/14/18 0132 01/15/18 0447  NA 134* 140 142  K 3.7 3.2* 3.7  CL 93* 98 99  CO2 27 28 31   GLUCOSE 97 99 114*  BUN 48* 51* 46*  CREATININE 3.08* 3.03* 3.11*  CALCIUM 9.5 8.9 9.0   GFR Estimated Creatinine Clearance: 14.1 mL/min (A) (by C-G formula based on SCr of 3.11 mg/dL (H)). Liver Function Tests: No results for input(s): AST, ALT, ALKPHOS, BILITOT, PROT, ALBUMIN in the last 168 hours. No results  for input(s): LIPASE, AMYLASE in the last 168 hours. No results for input(s): AMMONIA in the last 168 hours. Coagulation profile Recent Labs  Lab 01/14/18 1056  INR 1.01    CBC: Recent Labs  Lab 01/13/18 1316 01/14/18 0132  WBC 3.5* 3.5*  NEUTROABS 2.3  --   HGB 9.1* 7.8*  HCT 27.1* 22.8*  MCV 94.8 93.4  PLT 367 319   Cardiac Enzymes: No results for input(s): CKTOTAL, CKMB, CKMBINDEX, TROPONINI in the last 168 hours. BNP (last 3 results) No results for input(s): PROBNP in the last 8760 hours. CBG: No results for input(s): GLUCAP in the last 168 hours. D-Dimer: No results for input(s): DDIMER in the last 72 hours. Hgb A1c: No results for input(s): HGBA1C in the last 72 hours. Lipid Profile: No results for input(s): CHOL, HDL, LDLCALC, TRIG, CHOLHDL, LDLDIRECT in the last 72 hours. Thyroid function studies: No results for input(s): TSH, T4TOTAL, T3FREE, THYROIDAB in the last 72 hours.  Invalid input(s): FREET3 Anemia work up: Recent Labs    01/14/18 0941  FERRITIN 541*  TIBC 300  IRON 60   Sepsis Labs: Recent Labs  Lab 01/13/18 1316 01/13/18 2305 01/14/18 0132  WBC 3.5*  --  3.5*  LATICACIDVEN  --  1.4 1.0   Microbiology No results found for this or any previous visit (from the past 240 hour(s)).   Medications:   .  acidophilus  1 capsule Oral Daily  . cholecalciferol  400 Units Oral Daily  . exemestane  25 mg Oral QPC breakfast  . furosemide  40 mg Oral Daily  . heparin lock flush  250 Units Intravenous Daily  . pantoprazole  40 mg Oral Daily  . potassium chloride  40 mEq Oral BID  . thyroid  75 mg Oral Daily   Continuous Infusions: . sodium chloride 75 mL/hr at 01/15/18 0600  . cefTRIAXone (ROCEPHIN)  IV Stopped (01/14/18 2221)      LOS: 2 days   [facility] Pager [phone]  *Please refer to [email or web address], password [email or web address] to get updated schedule on who will round on this patient, as hospitalists switch teams  weekly. If 7PM-7AM, please contact night-coverage at [email or web address], password [email or web address] for any overnight needs.  01/15/2018, 8:39 AM

## 2018-01-15 NOTE — Progress Notes (Signed)
Referring Physician(s): Ortiz,A/McKenzie, Saralyn Pilar  Supervising Physician: Sandi Mariscal  Patient Status:  [facility] - In-pt  Chief Complaint: Metastatic breast cancer, right hydronephrosis, acute kidney injury   Subjective: Patient doing okay today.  Has minimal right flank discomfort.  No nausea or vomiting.   Allergies: Sulfa antibiotics; Adhesive [tape]; Betadine [povidone iodine]; and Mercury  Medications: Prior to Admission medications   Medication Sig Start Date End Date Taking? Authorizing Provider  abemaciclib (VERZENIO) 100 MG tablet Take 1 tablet (100 mg total) by mouth 2 (two) times daily. Swallow tablets whole. Do not chew, crush, or split tablets before swallowing. 11/30/17  Yes Magrinat, Virgie Dad, MD  ALPRAZolam Duanne Moron) 0.25 MG tablet Take 1 tablet (0.25 mg total) by mouth every 8 (eight) hours as needed. for anxiety 12/26/17  Yes Magrinat, Virgie Dad, MD  CALCIUM PO Take 2 tablets by mouth 2 (two) times daily.   Yes [provider]  cetirizine (ZYRTEC) 10 MG tablet Take 10 mg by mouth daily.   Yes [provider]  cholecalciferol (D-VI-SOL) 400 UNIT/ML LIQD Take 400 Units by mouth daily.   Yes [provider]  D-MANNOSE PO Take 1 tablet by mouth daily.   Yes [provider]  Digestive Enzymes (DIGESTIVE ENZYME PO) Take 1 tablet by mouth daily.   Yes [provider]  exemestane (AROMASIN) 25 MG tablet Take 1 tablet (25 mg total) by mouth daily after breakfast. 07/05/17  Yes Magrinat, Virgie Dad, MD  furosemide (LASIX) 40 MG tablet Take 1 tablet (40 mg total) by mouth daily. 11/29/17 01/13/18 Yes Magrinat, Virgie Dad, MD  gabapentin (NEURONTIN) 300 MG capsule Take 1 capsule (300 mg total) by mouth 3 (three) times daily as needed. Patient taking differently: Take 300 mg by mouth 3 (three) times daily as needed (nerve pain).  06/28/17  Yes Magrinat, Virgie Dad, MD  Multiple Vitamin (MULTIVITAMIN WITH MINERALS) TABS tablet Take 1 tablet by mouth  daily.   Yes [provider]  Omega-3 Fatty Acids (FISH OIL PO) Take 5 mLs by mouth daily.   Yes [provider]  ondansetron (ZOFRAN-ODT) 8 MG disintegrating tablet Take 1 tablet (8 mg total) by mouth every 8 (eight) hours as needed for nausea or vomiting. 06/22/17  Yes Magrinat, Virgie Dad, MD  Probiotic Product (PROBIOTIC DAILY PO) Take 1 tablet by mouth daily.   Yes [provider]  simethicone (MYLICON) 852 MG chewable tablet Chew 125 mg by mouth every 2 (two) hours as needed for flatulence.    Yes [provider]  Thyroid (NATURE-THROID) 81.25 MG TABS Take 81.25 mg by mouth daily.   Yes [provider]  heparin lock flush 100 UNIT/ML SOLN injection Inject 2.5 mLs (250 Units total) into the vein daily for 30 doses. 11/24/17 12/24/17  Alma Friendly, MD     Vital Signs: BP 125/63 (BP Location: Right Arm)   Pulse 89   Temp 98.2 F (36.8 C) (Oral)   Resp 18   Ht 5\' 1"  (1.549 m)   Wt 105 lb 3.2 oz (47.7 kg)   SpO2 100%   BMI 19.88 kg/m   Physical Exam right nephrostomy intact, output 3.2 L yesterday, 150 cc of slightly blood-tinged urine today; insertion site clean and dry, mildly tender to palpation  Imaging: Ct Renal Stone Study  Result Date: 01/13/2018 CLINICAL DATA:  Pt with pelvic pain and hematuria, no hx renal stones, hx bladder spasms Hx left breast ca with mastectomy, left nephrectomy, gastrostomy, colectomy, CKD. 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 EXAM: CT ABDOMEN AND PELVIS WITHOUT CONTRAST TECHNIQUE: Multidetector CT imaging of the abdomen and pelvis was performed following the standard protocol without IV contrast. COMPARISON:  CT of the abdomen and pelvis on 12/01/2017 FINDINGS: Lower chest: Trace RIGHT pleural effusion. Heart size is normal. No pericardial effusion or significant coronary  artery calcifications. LEFT mastectomy. Hepatobiliary: The liver is homogeneous. Gallbladder is decompressed. Pancreas: Unremarkable. No pancreatic ductal dilatation or surrounding inflammatory changes. Spleen: Normal in size without focal abnormality. Adrenals/Urinary Tract: LEFT nephrectomy. RIGHT adrenal gland is normal. There is moderate RIGHT hydronephrosis, increased slightly since prior study. The RIGHT proximal ureter is dilated. The distal ureter is likely occluded by pelvic mass and irregular thickening in the posterior bladder wall, LEFT greater than RIGHT. Stomach/Bowel: Gastrostomy tube in place. Marked rectal wall thickening again noted. Thickening of the distal transverse colon proximal to the LEFT mid abdominal ostomy. Vascular/Lymphatic: Minimal atherosclerotic calcification of the abdominal aorta. Evaluation adenopathy is limited by lack of intravenous contrast. Reproductive: Poorly defined soft tissue mass within the cul-de-sac and involving the bladder, measuring at least 7.8 x 5.3 centimeters. There is adjacent abnormal thickening/mass of the rectosigmoid colon. Other: Ascites.  Anasarca. Musculoskeletal: No acute or significant osseous findings. IMPRESSION: 1. Persistent significant ascites, consistent with known carcinomatosis. 2. Slight increase in RIGHT hydronephrosis. Pelvic mass is probably stable accounting for differences in technique. 3. Marked thickening of the rectosigmoid colon. Distal transverse colon is mildly thickened proximal to the ostomy. 4. Trace RIGHT pleural effusion. 5. LEFT nephrectomy. Electronically Signed   By: Nolon Nations M.D.   On: 01/13/2018 15:28   [place] Ascites (abdomen Limited)  Result Date: 01/14/2018 CLINICAL DATA:  62 year old female with metastatic breast cancer and growing abdominal distension. Evaluate for ascites. EXAM: LIMITED ABDOMEN ULTRASOUND FOR ASCITES TECHNIQUE: Limited ultrasound survey for ascites was performed in all four abdominal  quadrants. COMPARISON:  None. FINDINGS: Sonographic interrogation of the 4 quadrants of the abdomen demonstrates only trace ascites. Paracentesis was deferred. IMPRESSION: Minimal ascites, insufficient for paracentesis. Increase in abdominal distension appears to be secondary to increasing peritoneal carcinomatosis rather than ascitic fluid. Electronically Signed   By: Jacqulynn Cadet M.D.   On: 01/14/2018 13:57   Ir Nephrostomy Placement Right  Result Date: 01/14/2018 INDICATION: 62 year old female with a solitary right kidney and obstructed hydronephrosis secondary to progressive metastatic breast cancer in the pelvis. She has acute kidney injury and requires urgent percutaneous nephrostomy tube placement for renal decompression and to preserve renal function. EXAM: IR NEPHROSTOMY PLACEMENT RIGHT COMPARISON:  None. MEDICATIONS: 2 g Ancef; The antibiotic was administered in an appropriate time frame prior to skin puncture. ANESTHESIA/SEDATION: Fentanyl 100 mcg IV; Versed 2 mg IV Moderate Sedation Time:  10 minutes The patient was continuously monitored during the procedure by the interventional radiology nurse under my direct supervision. CONTRAST:  10 mL Isovue-300-administered into the collecting system(s) FLUOROSCOPY TIME:  Fluoroscopy Time: 0 minutes 36 seconds (3 mGy). COMPLICATIONS: None immediate. TECHNIQUE: The procedure, risks, benefits, and alternatives were explained to the patient. Questions regarding the procedure were encouraged and answered. The patient understands and consents to the procedure. The right flank was prepped with chlorhexidine in a sterile fashion, and a sterile drape was applied covering the operative field. A sterile gown and sterile gloves were used for the procedure. Local anesthesia was provided with 1% Lidocaine. The right flank was interrogated  with ultrasound and the left kidney identified. The kidney is hydronephrotic. A suitable access site on the skin overlying the  lower pole, posterior calix was identified. After local mg anesthesia was achieved, a small skin nick was made with an 11 blade scalpel. A 21 gauge Accustick needle was then advanced under direct sonographic guidance into the lower pole of the right kidney. A 0.018 inch wire was advanced under fluoroscopic guidance into the left renal collecting system. The Accustick sheath was then advanced over the wire and a 0.018 system exchanged for a 0.035 system. Gentle hand injection of contrast material confirms placement of the sheath within the renal collecting system. There is moderate hydronephrosis. The tract from the scan into the renal collecting system was then dilated serially to 10-French. A 10-French Cook all-purpose drain was then placed and positioned under fluoroscopic guidance. The locking loop is well formed within the left renal pelvis. The catheter was secured to the skin with 2-0 Prolene and a sterile bandage was placed. Catheter was left to gravity bag drainage. IMPRESSION: Successful placement of a right 10 French percutaneous nephrostomy tube. Electronically Signed   By: Jacqulynn Cadet M.D.   On: 01/14/2018 14:42    Labs:  CBC: Recent Labs    12/19/17 1201 01/13/18 1316 01/14/18 0132 01/15/18 0447  WBC 5.7 3.5* 3.5* 3.9*  HGB 8.9* 9.1* 7.8* 8.4*  HCT 26.2* 27.1* 22.8* 24.9*  PLT 392 367 319 350    COAGS: Recent Labs    01/14/18 1056  INR 1.01    BMP: Recent Labs    12/19/17 1201 01/13/18 1316 01/14/18 0132 01/15/18 0447  NA 138 134* 140 142  K 3.8 3.7 3.2* 3.7  CL 97* 93* 98 99  CO2 31 27 28 31   GLUCOSE 108* 97 99 114*  BUN 27* 48* 51* 46*  CALCIUM 9.9 9.5 8.9 9.0  CREATININE 1.33* 3.08* 3.03* 3.11*  GFRNONAA 42* 15* 15* 15*  GFRAA 49* 18* 18* 17*    LIVER FUNCTION TESTS: Recent Labs    11/20/17 0444 11/21/17 0432 11/22/17 0818 12/19/17 1201  BILITOT 1.0 1.0 0.7 0.3  AST 35 39 39 22  ALT 28 31 34 10  ALKPHOS 303* 293* 247* 99  PROT 5.9* 6.1*  6.2* 7.0  ALBUMIN 3.3* 3.0* 2.9* 3.4*    Assessment and Plan: Patient with history of metastatic breast carcinoma, right hydronephrosis, acute kidney injury, status post right percutaneous nephrostomy on 8/4.  Afebrile, WBC 3.9 hemoglobin 8.4 (7.8), creatinine 3.11 (3.03); nephrostomy with good output.  Continue IV and oral hydration; monitor labs closely.  Further plans as per oncology/urology.   Electronically Signed: D. Rowe Robert, PA-C 01/15/2018, 1:28 PM   I spent a total of 15 minutes at the the patient's bedside AND on the patient's hospital floor or unit, greater than 50% of which was counseling/coordinating care for right nephrostomy    Patient ID: Sonia Hill, female   DOB: 1956/03/29, 62 y.o.   MRN: [id number]

## 2018-01-15 NOTE — Progress Notes (Signed)
Dressing over drain in right posterior hip changed with two 4x4's.  Previous dressing clean except for  a dime sized area of bloody drainage from the drain site.

## 2018-01-15 NOTE — Progress Notes (Signed)
Sonia Hill   DOB:Feb 06, 1956   BS#:[id number]   UTM#:[id number]  Subjective:  Sonia Hill was hoping to travel to [place] today 01/15/2018, but she presented to the emergency room 2 days ago, 01/13/2018 with gross hematuria.  A CT renal stone study was obtained, which showed no renal stones, but moderate right hydronephrosis with the distal ureter likely occluded by pelvic mass and irregular thickening of the posterior bladder wall, left greater than right.  There was also persistent significant ascites and thickening of the rectosigmoid as noted previously.  Recall that the patient is status post left nephrectomy  On 01/14/2018 she underwent a right nephrostomy tube placement.  She tolerated that well.  This morning she is ambulatory in her room, has essentially no pain, and has had no further hematuria since the initial episode  Objective: Middle-aged white woman examined at bedside Vitals:   01/15/18 0612 01/15/18 1234  BP: 125/69 125/63  Pulse: 82 89  Resp: 18   Temp: 97.9 F (36.6 C) 98.2 F (36.8 C)  SpO2: 100% 100%    Body mass index is 19.88 kg/m.  Intake/Output Summary (Last 24 hours) at 01/15/2018 1532 Last data filed at 01/15/2018 1451 Gross per 24 hour  Intake 1923.2 ml  Output 3850 ml  Net -1926.8 ml     Sclerae unicteric  Lungs no rales or wheezes  Heart regular rate and rhythm  Abdomen soft, +BS, colostomy in place, right nephrostomy in place  Neuro nonfocal  Breast exam: Deferred  CBG (last 3)  No results for input(s): GLUCAP in the last 72 hours.   Labs:  Lab Results  Component Value Date   WBC 3.9 (L) 01/15/2018   HGB 8.4 (L) 01/15/2018   HCT 24.9 (L) 01/15/2018   MCV 94.3 01/15/2018   PLT 350 01/15/2018   NEUTROABS 2.3 01/13/2018    _0 @  Urine Studies No results for input(s): UHGB, CRYS in the last 72 hours.  Invalid input(s): UACOL, UAPR, USPG, UPH, UTP, UGL, UKET, UBIL, UNIT, UROB, ULEU, UEPI, UWBC, URBC, UBAC, CAST, [place], [place]  Basic  Metabolic Panel: Recent Labs  Lab 01/13/18 1316 01/14/18 0132 01/15/18 0447  NA 134* 140 142  K 3.7 3.2* 3.7  CL 93* 98 99  CO2 _1 GLUCOSE 97 99 114*  BUN 48* 51* 46*  CREATININE 3.08* 3.03* 3.11*  CALCIUM 9.5 8.9 9.0   GFR Estimated Creatinine Clearance: 14.1 mL/min (A) (by C-G formula based on SCr of 3.11 mg/dL (H)). Liver Function Tests: No results for input(s): AST, ALT, ALKPHOS, BILITOT, PROT, ALBUMIN in the last 168 hours. No results for input(s): LIPASE, AMYLASE in the last 168 hours. No results for input(s): AMMONIA in the last 168 hours. Coagulation profile Recent Labs  Lab 01/14/18 1056  INR 1.01    CBC: Recent Labs  Lab 01/13/18 1316 01/14/18 0132 01/15/18 0447  WBC 3.5* 3.5* 3.9*  NEUTROABS 2.3  --   --   HGB 9.1* 7.8* 8.4*  HCT 27.1* 22.8* 24.9*  MCV 94.8 93.4 94.3  PLT 367 319 350   Cardiac Enzymes: No results for input(s): CKTOTAL, CKMB, CKMBINDEX, TROPONINI in the last 168 hours. BNP: Invalid input(s): POCBNP CBG: No results for input(s): GLUCAP in the last 168 hours. D-Dimer No results for input(s): DDIMER in the last 72 hours. Hgb A1c No results for input(s): HGBA1C in the last 72 hours. Lipid Profile No results for input(s): CHOL, HDL, LDLCALC, TRIG, CHOLHDL, LDLDIRECT in the last 72 hours. Thyroid function studies  No results for input(s): TSH, T4TOTAL, T3FREE, THYROIDAB in the last 72 hours.  Invalid input(s): FREET3 Anemia work up Recent Labs    01/14/18 0941  FERRITIN 541*  TIBC 300  IRON 60   Microbiology Recent Results (from the past 240 hour(s))  Culture, Urine     Status: Abnormal   Collection Time: 01/13/18 11:51 PM  Result Value Ref Range Status   Specimen Description   Final    URINE, CATHETERIZED Performed at [facility] [address], [place], [place] [address]    Special Requests   Final    NONE Performed at [facility], [place] [address], [place], [place]  [address]    Culture (A)  Final    <10,000 COLONIES/mL INSIGNIFICANT GROWTH Performed at [facility] [address], [place], [place] [address]    Report Status 01/15/2018 FINAL  Final      Studies:  [place] Ascites (abdomen Limited)  Result Date: 01/14/2018 CLINICAL DATA:  61 year old female with metastatic breast cancer and growing abdominal distension. Evaluate for ascites. EXAM: LIMITED ABDOMEN ULTRASOUND FOR ASCITES TECHNIQUE: Limited ultrasound survey for ascites was performed in all four abdominal quadrants. COMPARISON:  None. FINDINGS: Sonographic interrogation of the 4 quadrants of the abdomen demonstrates only trace ascites. Paracentesis was deferred. IMPRESSION: Minimal ascites, insufficient for paracentesis. Increase in abdominal distension appears to be secondary to increasing peritoneal carcinomatosis rather than ascitic fluid. Electronically Signed   By: Jacqulynn Cadet M.D.   On: 01/14/2018 13:57   Ir Nephrostomy Placement Right  Result Date: 01/14/2018 INDICATION: 62 year old female with a solitary right kidney and obstructed hydronephrosis secondary to progressive metastatic breast cancer in the pelvis. She has acute kidney injury and requires urgent percutaneous nephrostomy tube placement for renal decompression and to preserve renal function. EXAM: IR NEPHROSTOMY PLACEMENT RIGHT COMPARISON:  None. MEDICATIONS: 2 g Ancef; The antibiotic was administered in an appropriate time frame prior to skin puncture. ANESTHESIA/SEDATION: Fentanyl 100 mcg IV; Versed 2 mg IV Moderate Sedation Time:  10 minutes The patient was continuously monitored during the procedure by the interventional radiology nurse under my direct supervision. CONTRAST:  10 mL Isovue-300-administered into the collecting system(s) FLUOROSCOPY TIME:  Fluoroscopy Time: 0 minutes 36 seconds (3 mGy). COMPLICATIONS: None immediate. TECHNIQUE: The procedure, risks, benefits, and alternatives were explained to the patient.  Questions regarding the procedure were encouraged and answered. The patient understands and consents to the procedure. The right flank was prepped with chlorhexidine in a sterile fashion, and a sterile drape was applied covering the operative field. A sterile gown and sterile gloves were used for the procedure. Local anesthesia was provided with 1% Lidocaine. The right flank was interrogated with ultrasound and the left kidney identified. The kidney is hydronephrotic. A suitable access site on the skin overlying the lower pole, posterior calix was identified. After local mg anesthesia was achieved, a small skin nick was made with an 11 blade scalpel. A 21 gauge Accustick needle was then advanced under direct sonographic guidance into the lower pole of the right kidney. A 0.018 inch wire was advanced under fluoroscopic guidance into the left renal collecting system. The Accustick sheath was then advanced over the wire and a 0.018 system exchanged for a 0.035 system. Gentle hand injection of contrast material confirms placement of the sheath within the renal collecting system. There is moderate hydronephrosis. The tract from the scan into the renal collecting system was then dilated serially to 10-French. A 10-French Cook all-purpose  drain was then placed and positioned under fluoroscopic guidance. The locking loop is well formed within the left renal pelvis. The catheter was secured to the skin with 2-0 Prolene and a sterile bandage was placed. Catheter was left to gravity bag drainage. IMPRESSION: Successful placement of a right 10 French percutaneous nephrostomy tube. Electronically Signed   By: Jacqulynn Cadet M.D.   On: 01/14/2018 14:42    Assessment: 62 y.o. BRCA 1-2 negative [facility] woman with lobular breast cancer stage IV at presentation July 2011  (1) status post left mastectomy and sentinel lymph node sampling in July 2011 for a lower inner quadrant T1 N1 M1, stage IV invasive lobular breast  cancer, grade 1, strongly estrogen and progesterone receptor-positive, HER2 negative with MIB-1 of 9% and no HER2 amplification,   (2) with multiple sclerotic bone lesions at presentation seen only on CT scan (not on bone scan or PET scan), but  with biopsy-proven metastatic disease to bone; and an elevated CA 27.29 at presentation,   (3) Oncotype recurrence score of 4, predicts a good response to antiestrogens.  (4) Systemic treatment has consisted of             a) tamoxifen with evidence of response but poor tolerance             b) letrozole starting August 2012, discontinued October 2014 per patient              (5) single functioning kidney  (6) status post bilateral salpingo-oophorectomy 01/24/2013, with benign pathology  (7) osteoporosis; the patient refuses bisphosphonate therapy; started [facility] February 2014.               (a) bone density was obtained under the care of Dr. Matthew Saras at [facility], 06/25/2012, showing osteoporosis with a T score -2.6. This was repeated 12/20/2013, showing again osteoporosis with T-scores between -2.4 and -2.8.             (b) the patient refuses zolendronate, denosumab, or other pharmacologic intervention             (c) bone density on 12/30/2015 under Dr. Matthew Saras showing osteoporosis with T scores between -2.7 and -3.3  (8) the patient refuses standard Mammography or tomography; undergoing thermography screening of the right breast.  (9) PET scan 06/05/2015 shows rectal thickening and a presacral mass; biopsy of this area 10/09/2015 confirms metastatic lobular breast cancer, again estrogen receptor positive, HER-2 nonamplified.             (a) pelvic MRI 03/11/2016 confirms stability of disease             (b) chest CT and pelvic MRI 09/02/2016 shows no change in the circumferential rectal thickening or evidence of extension beyond the serosa and chest CT findings             (c) colonoscopy 02/23/2017 showed a  very narrow rectal lumen with a few apparently uninvolved centimeters distally              (d) CT of the abdomen and pelvis 05/22/2017 (after diverting colostomy) shows interval increase in the size of the perirectal soft tissue masses.  (10) started fulvestrant and palbociclib 125 mg/ day [21/7] May 2017             (a) palbociclib dose decreased to 100 mg daily [21/7] with second cycle, started 11/25/2015             (b) palbociclib discontinued 03/20/2017  with evidence of disease progression             (c) last fulvestrant dose 03/24/2017, discontinued with evidence of progression  (11) status [facility] 2018  (12) started exemestane 07/05/2017,             (a) everolimus 5 mg/d started 07/14/2017--held as of 08/20/2017 secondary to rash             (b) exemestane stopped by patient 09/04/2017 to try "the natural way".  13) exemestane resumed 10/16/2017 (a) abemaciclib/Verzenioadded 11/29/2017  (14) s/p laparoscopic small bowel resection for SBO 11/08/2017 (a) peritoneal fluid 11/17/2017 growing enterococcus, vanc sensitive             (b) completed antibiotics 11/27/2017  (15) status post right nephrostomy placement 01/14/2018 for right hydronephrosis secondary to tumor  Plan:  Ranie tolerated nephrostomy placement well.  This is likely to be permanent.  She will need to learn how to empty the bag and evaluate the exit site for infection.  She will probably need some home health nursing help at least initially  She has been on exemestane for some weeks now but the abemaciclib was only started in July and it is too early to tell whether this is going to work or not.  Accordingly I am not making any changes to her systemic therapy.  Recall that this patient refuses chemotherapy at any point.  She already has a follow-up appointment with [place] on 01/30/2018.  That would be adequate in the absence of any  other developments.  It is a pretty she did not get to [place], but she was smart enough to buy insurance so she will be able to reschedule the entire trip at no additional cost.  I will follow peripherally while in the hospital.  Please let me know if I can be of further help at this point     Chauncey Cruel, MD 01/15/2018  3:32 PM Medical Oncology and Hematology [facility] [place], [place] [address] Tel. [phone]    Fax. [phone]

## 2018-01-15 NOTE — Progress Notes (Signed)
Pt concerned that she has not been receiving chemotherapy med Verzenio BID as she takes at home.  Reviewed med orders. Noted med not currently active. Reviewed notes and found pharmacy note as follows -     Rx Brief note: Abemaciclib (Verzenio)  Per P&T policy will hold if active infection.   Patient has been admitted with UTI, pyelonephritis.  Will d/c off MAR per policy.    Thanks  [name]  01/14/2018,12:22 AM  Discussed with patient and patient's husband. Will request MD address plan with patient as to when it will be safe for her to restart her chemo med.

## 2018-01-16 ENCOUNTER — Other Ambulatory Visit: Payer: Self-pay | Admitting: Radiology

## 2018-01-16 DIAGNOSIS — N1330 Unspecified hydronephrosis: Secondary | ICD-10-CM

## 2018-01-16 DIAGNOSIS — R1900 Intra-abdominal and pelvic swelling, mass and lump, unspecified site: Secondary | ICD-10-CM

## 2018-01-16 DIAGNOSIS — N183 Chronic kidney disease, stage 3 (moderate): Secondary | ICD-10-CM

## 2018-01-16 LAB — BASIC METABOLIC PANEL
Anion gap: 12 (ref 5–15)
BUN: 36 mg/dL — AB (ref 8–23)
CHLORIDE: 99 mmol/L (ref 98–111)
CO2: 27 mmol/L (ref 22–32)
Calcium: 8.8 mg/dL — ABNORMAL LOW (ref 8.9–10.3)
Creatinine, Ser: 2.25 mg/dL — ABNORMAL HIGH (ref 0.44–1.00)
GFR calc Af Amer: 26 mL/min — ABNORMAL LOW (ref >60)
GFR calc non Af Amer: 22 mL/min — ABNORMAL LOW (ref >60)
GLUCOSE: 85 mg/dL (ref 70–99)
Potassium: 3.2 mmol/L — ABNORMAL LOW (ref 3.5–5.1)
Sodium: 138 mmol/L (ref 135–145)

## 2018-01-16 LAB — ERYTHROPOIETIN: ERYTHROPOIETIN: 9.3 m[IU]/mL (ref 2.6–18.5)

## 2018-01-16 MED ORDER — ABEMACICLIB 100 MG PO TABS: 100.0000 mg | ORAL_TABLET | Freq: Two times a day (BID) | ORAL | 5 refills | Status: DC

## 2018-01-16 MED ORDER — POTASSIUM CHLORIDE CRYS ER 20 MEQ PO TBCR
40.0000 meq | EXTENDED_RELEASE_TABLET | Freq: Two times a day (BID) | ORAL | Status: DC
  Administered 2018-01-16: 40 meq via ORAL
  Filled 2018-01-16: qty 2

## 2018-01-16 NOTE — Discharge Summary (Signed)
Physician Discharge Summary  Sonia Hill HLK:[id number] DOB: 07/30/1955 DOA: 01/13/2018  PCP: [name], MD  Admit date: 01/13/2018 Discharge date: 01/16/2018  Admitted From: home Disposition:  Home  Recommendations for Outpatient Follow-up:  1. Follow up with Oncology next weeks 2. Please obtain BMP/CBC in one week   Home Health:none Equipment/Devices:none  Discharge Condition:Guarded CODE STATUS:full Diet recommendation: Heart Healthy   Brief/Interim Summary: 62 y.o. 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, with frequent urinary tract infections CT scan of the abdomen showed persistent ascites, peritoneal carcinomatosis right hydronephrosis with pelvic mass is stable and marked thickening of the sigmoid colon  Discharge Diagnoses:  Active Problems:   Breast cancer metastasized to bone (HCC)   Protein-calorie malnutrition, severe   Obstructive nephropathy   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 Urology was consulted and recommended a nephrostomy tube on 01/14/2018. In her creatinine starting to improve. She has a follow-up appointment next week with Dr. [name] would recommend checking a basic metabolic panel at this time.  Cancer metastatic  to the bone and peritoneal carcinomatosis: We will follow-up with oncology in 1 week her VERZENIO was held as her GFR was less than 30, this could be restarted as an outpatient once her GFR rise above this level.  Protein caloric malnutrition: Continue Ensure 3 times daily.  Anemia due to antineoplastic chemotherapy: Stable.  Anxiety: Continue Xanax. Discharge Instructions  Discharge Instructions    Diet - low sodium heart healthy   Complete by:  As directed    Increase activity slowly   Complete by:  As directed      Allergies as of 01/16/2018      Reactions   Sulfa Antibiotics Rash   Adhesive [tape] Rash   Ok to use paper tape and tegaderm over IV site   Betadine [povidone Iodine] Rash   Mercury Rash   Reaction mercurachrome      Medication List    TAKE these medications   abemaciclib 100 MG tablet Commonly known as:  VERZENIO Take 1 tablet (100 mg total) by mouth 2 (two) times daily. Swallow tablets whole. Do not chew, crush, or split tablets before swallowing. Start taking on:  01/23/2018 What changed:  These instructions start on 01/23/2018. If you are unsure what to do until then, ask your doctor or other care provider.   ALPRAZolam 0.25 MG tablet Commonly known as:  XANAX Take 1 tablet (0.25 mg total) by mouth every 8 (eight) hours as needed. for anxiety   CALCIUM PO Take 2 tablets by mouth 2 (two) times daily.   cetirizine 10 MG tablet Commonly known as:  ZYRTEC Take 10 mg by mouth daily.   cholecalciferol 400 UNIT/ML Liqd Commonly known as:  D-VI-SOL Take 400 Units by mouth daily.   D-MANNOSE PO Take 1 tablet by mouth daily.  DIGESTIVE ENZYME PO Take 1 tablet by mouth daily.   exemestane 25 MG tablet Commonly known as:  AROMASIN Take 1 tablet (25 mg total) by mouth daily after breakfast.   FISH OIL PO Take 5 mLs by mouth daily.   furosemide 40 MG tablet Commonly known as:  LASIX Take 1 tablet (40 mg total) by mouth daily.   gabapentin 300  MG capsule Commonly known as:  NEURONTIN Take 1 capsule (300 mg total) by mouth 3 (three) times daily as needed. What changed:  reasons to take this   heparin lock flush 100 UNIT/ML Soln injection Inject 2.5 mLs (250 Units total) into the vein daily for 30 doses.   multivitamin with minerals Tabs tablet Take 1 tablet by mouth daily.   NATURE-THROID 81.25 MG Tabs Generic drug:  Thyroid Take 81.25 mg by mouth daily.   ondansetron 8 MG disintegrating tablet Commonly known as:  ZOFRAN-ODT Take 1 tablet (8 mg total) by mouth every 8 (eight) hours as needed for nausea or vomiting.   PROBIOTIC DAILY PO Take 1 tablet by mouth daily.   simethicone 125 MG chewable tablet Commonly known as:  MYLICON Chew 785 mg by mouth every 2 (two) hours as needed for flatulence.       Allergies  Allergen Reactions  . Sulfa Antibiotics Rash  . Adhesive [Tape] Rash    Ok to use paper tape and tegaderm over IV site  . Betadine [Povidone Iodine] Rash  . Mercury Rash    Reaction mercurachrome    Consultations:  Urology   Procedures/Studies: Ct Renal Stone Study  Result Date: 01/13/2018 CLINICAL DATA:  Pt with pelvic pain and hematuria, no hx renal stones, hx bladder spasms Hx left breast ca with mastectomy, left nephrectomy, gastrostomy, colectomy, CKD. 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 EXAM: CT ABDOMEN AND PELVIS WITHOUT CONTRAST TECHNIQUE: Multidetector CT imaging of the abdomen and pelvis was performed following the standard protocol without IV contrast. COMPARISON:  CT of the abdomen and pelvis on 12/01/2017 FINDINGS: Lower chest: Trace RIGHT pleural effusion. Heart size is normal. No pericardial effusion or significant coronary artery calcifications. LEFT mastectomy. Hepatobiliary: The liver is homogeneous. Gallbladder is decompressed. Pancreas:  Unremarkable. No pancreatic ductal dilatation or surrounding inflammatory changes. Spleen: Normal in size without focal abnormality. Adrenals/Urinary Tract: LEFT nephrectomy. RIGHT adrenal gland is normal. There is moderate RIGHT hydronephrosis, increased slightly since prior study. The RIGHT proximal ureter is dilated. The distal ureter is likely occluded by pelvic mass and irregular thickening in the posterior bladder wall, LEFT greater than RIGHT. Stomach/Bowel: Gastrostomy tube in place. Marked rectal wall thickening again noted. Thickening of the distal transverse colon proximal to the LEFT mid abdominal ostomy. Vascular/Lymphatic: Minimal atherosclerotic calcification of the abdominal aorta. Evaluation adenopathy is limited by lack of intravenous contrast. Reproductive: Poorly defined soft tissue mass within the cul-de-sac and involving the bladder, measuring at least 7.8 x 5.3 centimeters. There is adjacent abnormal thickening/mass of the rectosigmoid colon. Other: Ascites.  Anasarca. Musculoskeletal: No acute or significant osseous findings. IMPRESSION: 1. Persistent significant ascites, consistent with known carcinomatosis. 2. Slight increase in RIGHT hydronephrosis. Pelvic mass is probably stable accounting for differences in technique. 3. Marked thickening of the rectosigmoid colon. Distal transverse colon is mildly thickened proximal to the ostomy. 4. Trace RIGHT pleural effusion. 5. LEFT nephrectomy. Electronically Signed   By: Nolon Nations M.D.   On: 01/13/2018  15:28   [place] Ascites (abdomen Limited)  Result Date: 01/14/2018 CLINICAL DATA:  62 year old female with metastatic breast cancer and growing abdominal distension. Evaluate for ascites. EXAM: LIMITED ABDOMEN ULTRASOUND FOR ASCITES TECHNIQUE: Limited ultrasound survey for ascites was performed in all four abdominal quadrants. COMPARISON:  None. FINDINGS: Sonographic interrogation of the 4 quadrants of the abdomen demonstrates only trace  ascites. Paracentesis was deferred. IMPRESSION: Minimal ascites, insufficient for paracentesis. Increase in abdominal distension appears to be secondary to increasing peritoneal carcinomatosis rather than ascitic fluid. Electronically Signed   By: Jacqulynn Cadet M.D.   On: 01/14/2018 13:57   Ir Nephrostomy Placement Right  Result Date: 01/14/2018 INDICATION: 62 year old female with a solitary right kidney and obstructed hydronephrosis secondary to progressive metastatic breast cancer in the pelvis. She has acute kidney injury and requires urgent percutaneous nephrostomy tube placement for renal decompression and to preserve renal function. EXAM: IR NEPHROSTOMY PLACEMENT RIGHT COMPARISON:  None. MEDICATIONS: 2 g Ancef; The antibiotic was administered in an appropriate time frame prior to skin puncture. ANESTHESIA/SEDATION: Fentanyl 100 mcg IV; Versed 2 mg IV Moderate Sedation Time:  10 minutes The patient was continuously monitored during the procedure by the interventional radiology nurse under my direct supervision. CONTRAST:  10 mL Isovue-300-administered into the collecting system(s) FLUOROSCOPY TIME:  Fluoroscopy Time: 0 minutes 36 seconds (3 mGy). COMPLICATIONS: None immediate. TECHNIQUE: The procedure, risks, benefits, and alternatives were explained to the patient. Questions regarding the procedure were encouraged and answered. The patient understands and consents to the procedure. The right flank was prepped with chlorhexidine in a sterile fashion, and a sterile drape was applied covering the operative field. A sterile gown and sterile gloves were used for the procedure. Local anesthesia was provided with 1% Lidocaine. The right flank was interrogated with ultrasound and the left kidney identified. The kidney is hydronephrotic. A suitable access site on the skin overlying the lower pole, posterior calix was identified. After local mg anesthesia was achieved, a small skin nick was made with an 11 blade  scalpel. A 21 gauge Accustick needle was then advanced under direct sonographic guidance into the lower pole of the right kidney. A 0.018 inch wire was advanced under fluoroscopic guidance into the left renal collecting system. The Accustick sheath was then advanced over the wire and a 0.018 system exchanged for a 0.035 system. Gentle hand injection of contrast material confirms placement of the sheath within the renal collecting system. There is moderate hydronephrosis. The tract from the scan into the renal collecting system was then dilated serially to 10-French. A 10-French Cook all-purpose drain was then placed and positioned under fluoroscopic guidance. The locking loop is well formed within the left renal pelvis. The catheter was secured to the skin with 2-0 Prolene and a sterile bandage was placed. Catheter was left to gravity bag drainage. IMPRESSION: Successful placement of a right 10 French percutaneous nephrostomy tube. Electronically Signed   By: Jacqulynn Cadet M.D.   On: 01/14/2018 14:42    Subjective:  No new complaints feels great. Discharge Exam: Vitals:   01/15/18 2100 01/16/18 0459  BP: 127/73 109/70  Pulse: 97 95  Resp: 18 16  Temp: 98.5 F (36.9 C) 99.1 F (37.3 C)  SpO2: 100% 98%   Vitals:   01/15/18 0612 01/15/18 1234 01/15/18 2100 01/16/18 0459  BP: 125/69 125/63 127/73 109/70  Pulse: 82 89 97 95  Resp: 18  18 16   Temp: 97.9 F (36.6 C) 98.2 F (36.8 C) 98.5 F (36.9 C)  99.1 F (37.3 C)  TempSrc: Oral Oral    SpO2: 100% 100% 100% 98%  Weight:      Height:        General: Pt is alert, awake, not in acute distress Cardiovascular: RRR, S1/S2 +, no rubs, no gallops Respiratory: CTA bilaterally, no wheezing, no rhonchi Abdominal: Soft, NT, ND, bowel sounds + Extremities: no edema, no cyanosis    The results of significant diagnostics from this hospitalization (including imaging, microbiology, ancillary and laboratory) are listed below for reference.      Microbiology: Recent Results (from the past 240 hour(s))  Culture, Urine     Status: Abnormal   Collection Time: 01/13/18 11:51 PM  Result Value Ref Range Status   Specimen Description   Final    URINE, CATHETERIZED Performed at [facility] [address], [place], [place] [address]    Special Requests   Final    NONE Performed at [facility], [place] [address], [place], [place] [address]    Culture (A)  Final    <10,000 COLONIES/mL INSIGNIFICANT GROWTH Performed at [facility] [address] [place], [place] [address]    Report Status 01/15/2018 FINAL  Final     Labs: BNP (last 3 results) No results for input(s): BNP in the last 8760 hours. Basic Metabolic Panel: Recent Labs  Lab 01/13/18 1316 01/14/18 0132 01/15/18 0447 01/16/18 0516  NA 134* 140 142 138  K 3.7 3.2* 3.7 3.2*  CL 93* 98 99 99  CO2 27 28 31 27   GLUCOSE 97 99 114* 85  BUN 48* 51* 46* 36*  CREATININE 3.08* 3.03* 3.11* 2.25*  CALCIUM 9.5 8.9 9.0 8.8*   Liver Function Tests: No results for input(s): AST, ALT, ALKPHOS, BILITOT, PROT, ALBUMIN in the last 168 hours. No results for input(s): LIPASE, AMYLASE in the last 168 hours. No results for input(s): AMMONIA in the last 168 hours. CBC: Recent Labs  Lab 01/13/18 1316 01/14/18 0132 01/15/18 0447  WBC 3.5* 3.5* 3.9*  NEUTROABS 2.3  --   --   HGB 9.1* 7.8* 8.4*  HCT 27.1* 22.8* 24.9*  MCV 94.8 93.4 94.3  PLT 367 319 350   Cardiac Enzymes: No results for input(s): CKTOTAL, CKMB, CKMBINDEX, TROPONINI in the last 168 hours. BNP: Invalid input(s): POCBNP CBG: No results for input(s): GLUCAP in the last 168 hours. D-Dimer No results for input(s): DDIMER in the last 72 hours. Hgb A1c No results for input(s): HGBA1C in the last 72 hours. Lipid Profile No results for input(s): CHOL, HDL, LDLCALC, TRIG, CHOLHDL, LDLDIRECT in the last 72 hours. Thyroid function studies No results for input(s):  TSH, T4TOTAL, T3FREE, THYROIDAB in the last 72 hours.  Invalid input(s): FREET3 Anemia work up Recent Labs    01/14/18 0941  FERRITIN 541*  TIBC 300  IRON 60   Urinalysis    Component Value Date/Time   COLORURINE YELLOW 01/13/2018 1226   APPEARANCEUR CLEAR 01/13/2018 1226   LABSPEC <1.005 (L) 01/13/2018 1226   LABSPEC 1.005 08/11/2015 1140   PHURINE 6.0 01/13/2018 1226   GLUCOSEU NEGATIVE 01/13/2018 1226   GLUCOSEU Negative 08/11/2015 1140   HGBUR LARGE (A) 01/13/2018 1226   BILIRUBINUR NEGATIVE 01/13/2018 1226   BILIRUBINUR Negative 08/11/2015 1140   KETONESUR NEGATIVE 01/13/2018 1226   PROTEINUR 100 (A) 01/13/2018 1226   UROBILINOGEN 0.2 08/11/2015 1140   NITRITE NEGATIVE 01/13/2018 1226   LEUKOCYTESUR NEGATIVE 01/13/2018 1226   LEUKOCYTESUR Negative 08/11/2015 1140  Sepsis Labs Invalid input(s): PROCALCITONIN,  WBC,  LACTICIDVEN Microbiology Recent Results (from the past 240 hour(s))  Culture, Urine     Status: Abnormal   Collection Time: 01/13/18 11:51 PM  Result Value Ref Range Status   Specimen Description   Final    URINE, CATHETERIZED Performed at [facility], [place] [address], [place], [place] [address]    Special Requests   Final    NONE Performed at [facility], [place] [address] [place], [place] [address]    Culture (A)  Final    <10,000 COLONIES/mL INSIGNIFICANT GROWTH Performed at [facility] [address] [place], [place] [address]    Report Status 01/15/2018 FINAL  Final     Time coordinating discharge: Over 30 minutes  SIGNED:   Charlynne Cousins, MD  [facility] 01/16/2018, 10:51 AM Pager   If 7PM-7AM, please contact night-coverage [email or web address] Password [email or web address]

## 2018-01-16 NOTE — Progress Notes (Signed)
Palliative care brief progress note  I met today with patient.  Plan to continue with full scope/FULL CODE interventions.  Full note to follow.  Nixon Sparr, MD [facility] Team [phone] 

## 2018-01-16 NOTE — Progress Notes (Signed)
Patient is stable for discharge. Discharge instructions and medications have been reviewed with the patient and her husband and all questions answered. AVS and prescriptions given to patient. Reviewed dressing changes for nephrostomy tube with the patient and her husband. Supplies given.   Rumeal Cullipher, [place] 01/16/2018 11:33 AM

## 2018-01-16 NOTE — Consult Note (Addendum)
Consultation Note Date: 01/16/2018   Patient Name: Sonia Hill  DOB: 1955/11/04  MRN: [id number]  Age / Sex: 62 y.o., female  PCP: [name], MD Referring Physician: Aileen Fass, Tammi Klippel, MD  Reason for Consultation: Establishing goals of care  HPI/Patient Profile: 62 y.o. 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  Now status post nephrostomy tube.  Consulted for goals of care..   Clinical Assessment and Goals of Care: I met today with Ms. Sonia Hill and her husband.  I introduced palliative care as specialized medical care for people living with serious illness. It focuses on providing relief from the symptoms and stress of a serious illness. The goal is to improve quality of life for both the patient and the family.  She recalls meeting with someone from our team during her admission when she had colostomy.  She reports the most important things to her are her family and her faith.  She is a Airline pilot and reports that she feels she is a miracle due to the fact that she is still doing so well so many years after being diagnosed with metastatic disease.  She has a very good understanding of her medical condition and is well versed in her current medical problems.  She relies on a combination of traditional medication and nontraditional medicine through someone she sees locally as well.  We had a long discussion regarding how she is coping with the fact that she is missing a long awaited trip because she has been hospitalized.  We also talked about her clinical course over the last several months as well as pathways moving forward.  SUMMARY OF RECOMMENDATIONS     -Ms. Sonia Hill is invested in a plan to continue with current interventions including continuation of her current therapies with Dr. [name].  She knows him well and trusts him to help guide her in decision making moving forward.  She reports that the use shared decision making to make determination about what steps to take.  She does decline to have aggressive chemotherapy or radiation as she feels that this caused her mother to die sooner than she would have otherwise from metastatic cancer. -She reports that her husband knows her wishes in the event that she is not able to make her own medical decisions.  They also have completed a living will.  At this time, she reports that she remains a full code, however, she would not want to be maintained on life support for an indefinite period of time if it does not look like she will ever recover.  We talked about the goals of medical interventions and trying to determine what things will help her live as well as possible for as long as possible.  Again, she relies on Dr. [name] to help guide her in this decision-making.  She will  follow-up with him in a couple of weeks and they can review how she is doing on her current therapies.  Code Status/Advance Care Planning:  Full code   Symptom Management:   Well controlled currently per her report.    Palliative Prophylaxis:   Bowel Regimen and Frequent Pain Assessment  Additional Recommendations (Limitations, Scope, Preferences):  Full Scope Treatment  Psycho-social/Spiritual:   Desire for further Chaplaincy support:no  Additional Recommendations: Caregiving  Support/Resources  Prognosis:   Unable to determine  Discharge Planning: Home with Home Health      Primary Diagnoses: Present on Admission: . Acute kidney injury superimposed on CKD ([place]) . Protein-calorie malnutrition, severe . Metastatic breast cancer to colostomy s/p colectomy/colostomy 11/08/2017 . Carcinomatosis peritonei  ([place]) . Breast cancer metastasized to bone [facility])   I have reviewed the medical record, interviewed the patient and family, and examined the patient. The following aspects are pertinent.  Past Medical History:  Diagnosis Date  . Arthritis    neck, upper back, shoulders - no meds - yoga  . Chronic kidney disease    Only has right kidney  . Depression    Hx - no current problem  . GERD (gastroesophageal reflux disease)    diet controlled - no meds  . Hyperlipidemia    diet controlled  . Hypothyroidism   . lt breast ca dx'd 11/2009  . Osteoporosis, unspecified 12/06/2013  . PONV (postoperative nausea and vomiting)   . SBO (small bowel obstruction) ([place])   . SVD (spontaneous vaginal delivery)    x 1   Social History   Socioeconomic History  . Marital status: Married    Spouse name: Not on file  . Number of children: Not on file  . Years of education: Not on file  . Highest education level: Not on file  Occupational History  . Not on file  Social Needs  . Financial resource strain: Not on file  . Food insecurity:    Worry: Not on file    Inability: Not on file  . Transportation needs:    Medical: Not on file    Non-medical: Not on file  Tobacco Use  . Smoking status: Never Smoker  . Smokeless tobacco: Never Used  Substance and Sexual Activity  . Alcohol use: No  . Drug use: No  . Sexual activity: Yes    Birth control/protection: Post-menopausal  Lifestyle  . Physical activity:    Days per week: Not on file    Minutes per session: Not on file  . Stress: Not on file  Relationships  . Social connections:    Talks on phone: Not on file    Gets together: Not on file    Attends religious service: Not on file    Active member of club or organization: Not on file    Attends meetings of clubs or organizations: Not on file    Relationship status: Not on file  Other Topics Concern  . Not on file  Social History Narrative  . Not on file   Family History  Problem  Relation Age of Onset  . Breast cancer Mother 53  . Breast cancer Maternal Aunt 58  . Breast cancer Other        MGF's sister dx with breast cancer in her 27s   Scheduled Meds: . acidophilus  1 capsule Oral Daily  . cholecalciferol  400 Units Oral Daily  . exemestane  25 mg Oral QPC breakfast  . gabapentin  300 mg Oral  TID  . heparin lock flush  250 Units Intravenous Daily  . pantoprazole  40 mg Oral BID  . polyethylene glycol  17 g Oral Daily  . potassium chloride  40 mEq Oral BID  . thyroid  75 mg Oral Daily   Continuous Infusions: . sodium chloride 10 mL/hr at 01/16/18 0811   PRN Meds:.acetaminophen **OR** acetaminophen, ALPRAZolam, alum & mag hydroxide-simeth, HYDROcodone-acetaminophen, ondansetron **OR** ondansetron (ZOFRAN) IV, simethicone Medications Prior to Admission:  Prior to Admission medications   Medication Sig Start Date End Date Taking? Authorizing Provider  abemaciclib (VERZENIO) 100 MG tablet Take 1 tablet (100 mg total) by mouth 2 (two) times daily. Swallow tablets whole. Do not chew, crush, or split tablets before swallowing. 11/30/17  Yes Magrinat, Virgie Dad, MD  ALPRAZolam Duanne Moron) 0.25 MG tablet Take 1 tablet (0.25 mg total) by mouth every 8 (eight) hours as needed. for anxiety 12/26/17  Yes Magrinat, Virgie Dad, MD  CALCIUM PO Take 2 tablets by mouth 2 (two) times daily.   Yes [provider]  cetirizine (ZYRTEC) 10 MG tablet Take 10 mg by mouth daily.   Yes [provider]  cholecalciferol (D-VI-SOL) 400 UNIT/ML LIQD Take 400 Units by mouth daily.   Yes [provider]  D-MANNOSE PO Take 1 tablet by mouth daily.   Yes [provider]  Digestive Enzymes (DIGESTIVE ENZYME PO) Take 1 tablet by mouth daily.   Yes [provider]  exemestane (AROMASIN) 25 MG tablet Take 1 tablet (25 mg total) by mouth daily after breakfast. 07/05/17  Yes Magrinat, Virgie Dad, MD  furosemide (LASIX) 40 MG tablet Take 1 tablet (40 mg total) by  mouth daily. 11/29/17 01/13/18 Yes Magrinat, Virgie Dad, MD  gabapentin (NEURONTIN) 300 MG capsule Take 1 capsule (300 mg total) by mouth 3 (three) times daily as needed. Patient taking differently: Take 300 mg by mouth 3 (three) times daily as needed (nerve pain).  06/28/17  Yes Magrinat, Virgie Dad, MD  Multiple Vitamin (MULTIVITAMIN WITH MINERALS) TABS tablet Take 1 tablet by mouth daily.   Yes [provider]  Omega-3 Fatty Acids (FISH OIL PO) Take 5 mLs by mouth daily.   Yes [provider]  ondansetron (ZOFRAN-ODT) 8 MG disintegrating tablet Take 1 tablet (8 mg total) by mouth every 8 (eight) hours as needed for nausea or vomiting. 06/22/17  Yes Magrinat, Virgie Dad, MD  Probiotic Product (PROBIOTIC DAILY PO) Take 1 tablet by mouth daily.   Yes [provider]  simethicone (MYLICON) 098 MG chewable tablet Chew 125 mg by mouth every 2 (two) hours as needed for flatulence.    Yes [provider]  Thyroid (NATURE-THROID) 81.25 MG TABS Take 81.25 mg by mouth daily.   Yes [provider]  heparin lock flush 100 UNIT/ML SOLN injection Inject 2.5 mLs (250 Units total) into the vein daily for 30 doses. 11/24/17 12/24/17  Alma Friendly, MD   Allergies  Allergen Reactions  . Sulfa Antibiotics Rash  . Adhesive [Tape] Rash    Ok to use paper tape and tegaderm over IV site  . Betadine [Povidone Iodine] Rash  . Mercury Rash    Reaction mercurachrome   Review of Systems  Denies complaints today other than being disappointed about missing trip to [place]  Physical Exam  General: Alert, awake, in no acute distress. Thin. HEENT: No bruits, no goiter, no JVD Heart: Regular rate and rhythm. No murmur appreciated. Lungs: Good air movement, clear Abdomen: Soft, nontender, nondistended, positive  bowel sounds.  Ext: No significant edema Skin: Warm and dry Neuro: Grossly intact, nonfocal.  Vital Signs: BP 109/70 (BP Location: Right Arm)   Pulse 95   Temp 99.1  F (37.3 C)   Resp 16   Ht '5\' 1"'  (1.549 m)   Wt 47.7 kg (105 lb 3.2 oz)   SpO2 98%   BMI 19.88 kg/m  Pain Scale: 0-10 POSS *See Group Information*: 1-Acceptable,Awake and alert Pain Score: 0-No pain   SpO2: SpO2: 98 % O2 Device:SpO2: 98 % O2 Flow Rate: .O2 Flow Rate (L/min): 2 L/min  IO: Intake/output summary:   Intake/Output Summary (Last 24 hours) at 01/16/2018 1011 Last data filed at 01/16/2018 0615 Gross per 24 hour  Intake 1625.17 ml  Output 2900 ml  Net -1274.83 ml    LBM: Last BM Date: 01/15/18 Baseline Weight: Weight: 47.7 kg (105 lb 3.2 oz) Most recent weight: Weight: 47.7 kg (105 lb 3.2 oz)     Palliative Assessment/Data:     Time In: 1630 Time Out: 1800 Time Total: 90 Greater than 50%  of this time was spent counseling and coordinating care related to the above assessment and plan.  Signed by: Micheline Rough, MD   Please contact Palliative Medicine Team phone at [phone] for questions and concerns.  For individual provider: See Shea Evans

## 2018-01-19 ENCOUNTER — Telehealth: Payer: Self-pay | Admitting: Oncology

## 2018-01-19 ENCOUNTER — Other Ambulatory Visit: Payer: Self-pay | Admitting: Oncology

## 2018-01-19 NOTE — Telephone Encounter (Signed)
Scheduled patient for lab/fu per 8/9 schedule message - confirmed with desk nurse lab/fu is what is needed. Also per desk ok to schedule with LC.  Spoke with patient re appointments. Per patient request lab/fu decoupled and lab scheduled 8/12 and f/u 8/14.

## 2018-01-22 ENCOUNTER — Inpatient Hospital Stay: Payer: 59 | Attending: Oncology

## 2018-01-22 DIAGNOSIS — M810 Age-related osteoporosis without current pathological fracture: Secondary | ICD-10-CM | POA: Insufficient documentation

## 2018-01-22 DIAGNOSIS — Z87440 Personal history of urinary (tract) infections: Secondary | ICD-10-CM | POA: Diagnosis not present

## 2018-01-22 DIAGNOSIS — K56609 Unspecified intestinal obstruction, unspecified as to partial versus complete obstruction: Secondary | ICD-10-CM

## 2018-01-22 DIAGNOSIS — Z803 Family history of malignant neoplasm of breast: Secondary | ICD-10-CM | POA: Diagnosis not present

## 2018-01-22 DIAGNOSIS — K5900 Constipation, unspecified: Secondary | ICD-10-CM | POA: Insufficient documentation

## 2018-01-22 DIAGNOSIS — M129 Arthropathy, unspecified: Secondary | ICD-10-CM | POA: Diagnosis not present

## 2018-01-22 DIAGNOSIS — N1330 Unspecified hydronephrosis: Secondary | ICD-10-CM | POA: Insufficient documentation

## 2018-01-22 DIAGNOSIS — M7989 Other specified soft tissue disorders: Secondary | ICD-10-CM | POA: Diagnosis not present

## 2018-01-22 DIAGNOSIS — M818 Other osteoporosis without current pathological fracture: Secondary | ICD-10-CM

## 2018-01-22 DIAGNOSIS — C7951 Secondary malignant neoplasm of bone: Secondary | ICD-10-CM | POA: Insufficient documentation

## 2018-01-22 DIAGNOSIS — E785 Hyperlipidemia, unspecified: Secondary | ICD-10-CM | POA: Diagnosis not present

## 2018-01-22 DIAGNOSIS — Z79899 Other long term (current) drug therapy: Secondary | ICD-10-CM | POA: Diagnosis not present

## 2018-01-22 DIAGNOSIS — Z9012 Acquired absence of left breast and nipple: Secondary | ICD-10-CM | POA: Diagnosis not present

## 2018-01-22 DIAGNOSIS — Z170 Estrogen receptor positive status [ER+]: Secondary | ICD-10-CM | POA: Diagnosis not present

## 2018-01-22 DIAGNOSIS — I700 Atherosclerosis of aorta: Secondary | ICD-10-CM | POA: Diagnosis not present

## 2018-01-22 DIAGNOSIS — R978 Other abnormal tumor markers: Secondary | ICD-10-CM | POA: Diagnosis not present

## 2018-01-22 DIAGNOSIS — J90 Pleural effusion, not elsewhere classified: Secondary | ICD-10-CM | POA: Diagnosis not present

## 2018-01-22 DIAGNOSIS — C50312 Malignant neoplasm of lower-inner quadrant of left female breast: Secondary | ICD-10-CM | POA: Diagnosis present

## 2018-01-22 DIAGNOSIS — R609 Edema, unspecified: Secondary | ICD-10-CM | POA: Insufficient documentation

## 2018-01-22 DIAGNOSIS — Z79811 Long term (current) use of aromatase inhibitors: Secondary | ICD-10-CM | POA: Diagnosis not present

## 2018-01-22 DIAGNOSIS — E440 Moderate protein-calorie malnutrition: Secondary | ICD-10-CM

## 2018-01-22 DIAGNOSIS — K219 Gastro-esophageal reflux disease without esophagitis: Secondary | ICD-10-CM | POA: Insufficient documentation

## 2018-01-22 DIAGNOSIS — Z905 Acquired absence of kidney: Secondary | ICD-10-CM | POA: Insufficient documentation

## 2018-01-22 DIAGNOSIS — C50912 Malignant neoplasm of unspecified site of left female breast: Secondary | ICD-10-CM

## 2018-01-22 DIAGNOSIS — R188 Other ascites: Secondary | ICD-10-CM | POA: Insufficient documentation

## 2018-01-22 DIAGNOSIS — Z933 Colostomy status: Secondary | ICD-10-CM | POA: Diagnosis not present

## 2018-01-22 DIAGNOSIS — E039 Hypothyroidism, unspecified: Secondary | ICD-10-CM | POA: Insufficient documentation

## 2018-01-22 DIAGNOSIS — F329 Major depressive disorder, single episode, unspecified: Secondary | ICD-10-CM | POA: Diagnosis not present

## 2018-01-22 LAB — COMPREHENSIVE METABOLIC PANEL
ALT: 7 U/L (ref 0–44)
AST: 23 U/L (ref 15–41)
Albumin: 3.7 g/dL (ref 3.5–5.0)
Alkaline Phosphatase: 69 U/L (ref 38–126)
Anion gap: 12 (ref 5–15)
BILIRUBIN TOTAL: 0.3 mg/dL (ref 0.3–1.2)
BUN: 25 mg/dL — AB (ref 8–23)
CO2: 26 mmol/L (ref 22–32)
CREATININE: 1.24 mg/dL — AB (ref 0.44–1.00)
Calcium: 9.8 mg/dL (ref 8.9–10.3)
Chloride: 97 mmol/L — ABNORMAL LOW (ref 98–111)
GFR calc Af Amer: 52 mL/min — ABNORMAL LOW (ref >60)
GFR calc non Af Amer: 45 mL/min — ABNORMAL LOW (ref >60)
Glucose, Bld: 101 mg/dL — ABNORMAL HIGH (ref 70–99)
Potassium: 4.4 mmol/L (ref 3.5–5.1)
Sodium: 135 mmol/L (ref 135–145)
TOTAL PROTEIN: 7.5 g/dL (ref 6.5–8.1)

## 2018-01-22 LAB — CBC WITH DIFFERENTIAL/PLATELET
Basophils Absolute: 0 10*3/uL (ref 0.0–0.1)
Basophils Relative: 1 %
EOS ABS: 0.1 10*3/uL (ref 0.0–0.5)
EOS PCT: 3 %
HCT: 25.7 % — ABNORMAL LOW (ref 34.8–46.6)
Hemoglobin: 8.5 g/dL — ABNORMAL LOW (ref 11.6–15.9)
LYMPHS ABS: 1 10*3/uL (ref 0.9–3.3)
Lymphocytes Relative: 26 %
MCH: 31.3 pg (ref 25.1–34.0)
MCHC: 33.1 g/dL (ref 31.5–36.0)
MCV: 94.5 fL (ref 79.5–101.0)
MONO ABS: 0.3 10*3/uL (ref 0.1–0.9)
Monocytes Relative: 8 %
Neutro Abs: 2.5 10*3/uL (ref 1.5–6.5)
Neutrophils Relative %: 62 %
PLATELETS: 402 10*3/uL — AB (ref 145–400)
RBC: 2.72 MIL/uL — AB (ref 3.70–5.45)
RDW: 15.7 % — AB (ref 11.2–14.5)
WBC: 4 10*3/uL (ref 3.9–10.3)

## 2018-01-23 LAB — CANCER ANTIGEN 27.29: CAN 27.29: 768.9 U/mL — AB (ref 0.0–38.6)

## 2018-01-24 ENCOUNTER — Inpatient Hospital Stay: Payer: 59

## 2018-01-24 ENCOUNTER — Inpatient Hospital Stay (HOSPITAL_BASED_OUTPATIENT_CLINIC_OR_DEPARTMENT_OTHER): Payer: 59 | Admitting: Adult Health

## 2018-01-24 ENCOUNTER — Telehealth: Payer: Self-pay | Admitting: Adult Health

## 2018-01-24 ENCOUNTER — Encounter: Payer: Self-pay | Admitting: Adult Health

## 2018-01-24 ENCOUNTER — Other Ambulatory Visit: Payer: Self-pay

## 2018-01-24 VITALS — BP 122/60 | HR 77 | Temp 97.8°F | Resp 19 | Ht 61.0 in | Wt 96.6 lb

## 2018-01-24 DIAGNOSIS — Z79811 Long term (current) use of aromatase inhibitors: Secondary | ICD-10-CM

## 2018-01-24 DIAGNOSIS — C784 Secondary malignant neoplasm of small intestine: Secondary | ICD-10-CM

## 2018-01-24 DIAGNOSIS — C50919 Malignant neoplasm of unspecified site of unspecified female breast: Secondary | ICD-10-CM

## 2018-01-24 DIAGNOSIS — Z87440 Personal history of urinary (tract) infections: Secondary | ICD-10-CM

## 2018-01-24 DIAGNOSIS — N179 Acute kidney failure, unspecified: Secondary | ICD-10-CM

## 2018-01-24 DIAGNOSIS — E039 Hypothyroidism, unspecified: Secondary | ICD-10-CM

## 2018-01-24 DIAGNOSIS — C50312 Malignant neoplasm of lower-inner quadrant of left female breast: Secondary | ICD-10-CM

## 2018-01-24 DIAGNOSIS — M7989 Other specified soft tissue disorders: Secondary | ICD-10-CM

## 2018-01-24 DIAGNOSIS — M129 Arthropathy, unspecified: Secondary | ICD-10-CM

## 2018-01-24 DIAGNOSIS — E785 Hyperlipidemia, unspecified: Secondary | ICD-10-CM

## 2018-01-24 DIAGNOSIS — R188 Other ascites: Secondary | ICD-10-CM

## 2018-01-24 DIAGNOSIS — Z905 Acquired absence of kidney: Secondary | ICD-10-CM

## 2018-01-24 DIAGNOSIS — Z170 Estrogen receptor positive status [ER+]: Secondary | ICD-10-CM

## 2018-01-24 DIAGNOSIS — D649 Anemia, unspecified: Secondary | ICD-10-CM

## 2018-01-24 DIAGNOSIS — R609 Edema, unspecified: Secondary | ICD-10-CM

## 2018-01-24 DIAGNOSIS — I700 Atherosclerosis of aorta: Secondary | ICD-10-CM

## 2018-01-24 DIAGNOSIS — C7951 Secondary malignant neoplasm of bone: Secondary | ICD-10-CM

## 2018-01-24 DIAGNOSIS — Z79899 Other long term (current) drug therapy: Secondary | ICD-10-CM

## 2018-01-24 DIAGNOSIS — Z9012 Acquired absence of left breast and nipple: Secondary | ICD-10-CM

## 2018-01-24 DIAGNOSIS — Z933 Colostomy status: Secondary | ICD-10-CM

## 2018-01-24 DIAGNOSIS — M810 Age-related osteoporosis without current pathological fracture: Secondary | ICD-10-CM

## 2018-01-24 DIAGNOSIS — N189 Chronic kidney disease, unspecified: Secondary | ICD-10-CM

## 2018-01-24 DIAGNOSIS — R978 Other abnormal tumor markers: Secondary | ICD-10-CM

## 2018-01-24 DIAGNOSIS — N1330 Unspecified hydronephrosis: Secondary | ICD-10-CM

## 2018-01-24 DIAGNOSIS — K5900 Constipation, unspecified: Secondary | ICD-10-CM

## 2018-01-24 DIAGNOSIS — C50911 Malignant neoplasm of unspecified site of right female breast: Secondary | ICD-10-CM

## 2018-01-24 DIAGNOSIS — J90 Pleural effusion, not elsewhere classified: Secondary | ICD-10-CM

## 2018-01-24 DIAGNOSIS — F329 Major depressive disorder, single episode, unspecified: Secondary | ICD-10-CM

## 2018-01-24 DIAGNOSIS — Z803 Family history of malignant neoplasm of breast: Secondary | ICD-10-CM

## 2018-01-24 DIAGNOSIS — K219 Gastro-esophageal reflux disease without esophagitis: Secondary | ICD-10-CM

## 2018-01-24 LAB — SAMPLE TO BLOOD BANK

## 2018-01-24 NOTE — Telephone Encounter (Signed)
Gave avs and calendar ° °

## 2018-01-24 NOTE — Progress Notes (Addendum)
[facility]  [phone]) [phone] Fax:(336) [phone]     ID: REI CONTEE   DOB: 01-Nov-1955  MR#: [id number]  JYN#:[id number]  Patient Care Team: [name], MD as PCP - General (Family Medicine) Hill, Sonia Dad, MD as Consulting Physician (Oncology) Molli Posey, MD as Consulting Physician (Obstetrics and Gynecology) Laurence Spates, MD as Consulting Physician (Gastroenterology) Leighton Ruff, MD as Consulting Physician (General Surgery) Delrae Rend, MD as Consulting Physician (Endocrinology) Clarene Essex, MD as Consulting Physician (Gastroenterology) Donato Heinz, MD as Consulting Physician (Nephrology)   CHIEF COMPLAINT: Metastatic breast cancer, estrogen receptor positive  CURRENT TREATMENT: Exemestane, abemaciclib; [refuses bisphosphonates or denosumab; also refuses chemo and radiation options]  INTERVAL HISTORY: Sonia Hill  returns today for follow-up and treatment of her 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 She is accompanied by her husband. She was admitted to the hospital due to AKI secondarty to tumor and underwent nephrostomy tube placement.  Due to this, the Abemaciclib was held.    The patient continues on exemestane, with excellent tolerance.    REVIEW OF SYSTEMS: Sonia Hill is recovering since discharge.  On Monday she was nauseated after undergoing lab work.  She did take some Zofran, and she notes some constipation and thinks it may be due to the abemaciclib being held.  She has been taking Miralax.  She is taking an organic protein drink three times per day.  She notes that she is putting out normally from her urostomy and colostomy and that she notes that she needs to be more conscientious of her fluid intake.    Sonia Hill does note a slight discomfort in her right flank area.  She says this is manageable.  [facility]onia Hill is fatigued and wonders what her hemoglobin is today.  She does continue to have left leg swelling.  She  sees physical therapy for lymphedema, and gets this for her left leg edema. She does have a compression hose she wore in the hospital which helped.    BREAST CANCER HISTORY:  From the initial intake note:  Sonia Hill had screening mammography on 11/10/2009 showing very dense tissue with a possible distortion in the left breast.  Digital mammography on June 10th showed a 1.5 cm cluster of microcalcifications in the lower inner portion of the breast.  On physical exam, there was no palpable mass and no palpable axillary adenopathy.  There was some nipple inversion inferolaterally.  Ultrasound showed a 2.6 cm irregular mass-like area with dense posterior acoustic shadowing in the 3 o'clock position, 1 cm from the nipple, but normal appearing left axillary lymph nodes.    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  A second mass noted on ultrasound was also lobular, also low grade.  Both were ER and PR positive at well over 90%.  Both had low proliferation markers at 9%, and both were Her-2 negative by CISH with ratios of 1.6 and 1.24.  In short, both masses were nearly identical.    On June 16th, the patient had bilateral breast MRIs which showed in the left breast at 3 o'clock an irregular area of abnormal enhancement measuring up to 2.7 cm and in the left lower inner quadrant, a second post-biopsy area measuring 1.5 cm.  The difference between these two areas was stated at the conference the morning of the visit to be approximately 5 cm.  Given this data  the patient opted for Left mastectomy with sentinel lymph node sampling, performed July 2011 with results as detailed below.  Subsequent history is as detailed below.    PAST MEDICAL HISTORY: Past Medical History:  Diagnosis Date  . Arthritis    neck, upper back, shoulders - no meds - yoga  . Chronic  kidney disease    Only has right kidney  . Depression    Hx - no current problem  . GERD (gastroesophageal reflux disease)    diet controlled - no meds  . Hyperlipidemia    diet controlled  . Hypothyroidism   . lt breast ca dx'd 11/2009  . Osteoporosis, unspecified 12/06/2013  . PONV (postoperative nausea and vomiting)   . SBO (small bowel obstruction) ([place])   . SVD (spontaneous vaginal delivery)    x 1  1. Osteopenia. 2. Congenital left renal atrophy.  3. History of recurrent UTIs. 4. History of bladder reconstruction for reflux more than 18 years ago. 5. History of depression and anxiety. 6. History of hypothyroidism.  7. History of "sling procedure".  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    FAMILY HISTORY The patient's father is alive in his early 58s.  He has Alzheimer's disease. The patient's mother was diagnosed with breast cancer at the age of 44.  She died at the age of 39.  The patient's father had one out of two sisters with breast cancer.  There are other breast cancers on the mother's side.  The patient has one brother and one younger sister, neither with cancer.    GYNECOLOGIC HISTORY:  (Reviewed 12/06/2013) She is GX P1.  That pregnancy was at age 28.  Menarche was at age 83.  She was perimenopausal at the time of diagnosis but is now convincingly postmenopausal  SOCIAL HISTORY:  (Reviewed 12/06/2013) She works as a Electrical engineer, part-time and on consultation.  Her husband, Sonia Hill, is a Architect at [employer].  Sister Sonia Hill works as a Pharmacist, hospital in [place].  The patient's son Sonia Hill is  in college.  The patient attends [employer].   ADVANCED DIRECTIVES: in place  HEALTH MAINTENANCE: (Updated 12/06/2013)  Social History   Tobacco Use  . Smoking status: Never Smoker  . Smokeless tobacco: Never Used  Substance Use Topics  . Alcohol use: No  . Drug use: No      Colonoscopy: 06/18/2015; [facility]  PAP: UTD/May 2015, Dr. Matthew Hill  Bone density: [facility] 2014, osteoporosis  Lipid panel: UTD (Not on file)    Allergies  Allergen Reactions  . Sulfa Antibiotics Rash  . Adhesive [Tape] Rash    Ok to use paper tape and tegaderm over IV site  . Betadine [Povidone Iodine] Rash  . Mercury Rash    Reaction mercurachrome    Current Outpatient Medications  Medication Sig Dispense Refill  . abemaciclib (VERZENIO) 100 MG tablet Take 1 tablet (100 mg total) by mouth 2 (two) times daily. Swallow tablets whole. Do not chew, crush, or split tablets before swallowing. 56 tablet 5  . ALPRAZolam (XANAX) 0.25 MG tablet Take 1 tablet (0.25 mg total) by mouth every 8 (eight) hours as needed. for anxiety 30 tablet 0  . CALCIUM PO Take 2 tablets by mouth 2 (two) times daily.    . cetirizine (ZYRTEC) 10 MG tablet Take 10 mg by mouth daily.    . cholecalciferol (D-VI-SOL) 400 UNIT/ML LIQD Take 400 Units by mouth daily.    Marland Kitchen  D-MANNOSE PO Take 1 tablet by mouth daily.    . Digestive Enzymes (DIGESTIVE ENZYME PO) Take 1 tablet by mouth daily.    Marland Kitchen exemestane (AROMASIN) 25 MG tablet Take 1 tablet (25 mg total) by mouth daily after breakfast. 90 tablet 4  . furosemide (LASIX) 40 MG tablet Take 1 tablet (40 mg total) by mouth daily. 30 tablet 0  . gabapentin (NEURONTIN) 300 MG capsule Take 1 capsule (300 mg total) by mouth 3 (three) times daily as needed. (Patient taking differently: Take 300 mg by mouth 3 (three) times daily as needed (nerve pain). ) 90 capsule 1  . heparin lock flush 100 UNIT/ML SOLN injection Inject 2.5 mLs (250 Units total) into the vein daily for 30 doses. 75 mL 0  . Multiple Vitamin (MULTIVITAMIN WITH MINERALS) TABS tablet Take 1 tablet by mouth daily.    . Omega-3 Fatty Acids (FISH OIL PO) Take 5 mLs by mouth daily.    . ondansetron (ZOFRAN-ODT) 8 MG disintegrating tablet Take 1 tablet (8 mg total) by mouth every 8 (eight) hours as needed for  nausea or vomiting. 40 tablet 1  . Probiotic Product (PROBIOTIC DAILY PO) Take 1 tablet by mouth daily.    . simethicone (MYLICON) 564 MG chewable tablet Chew 125 mg by mouth every 2 (two) hours as needed for flatulence.     . Thyroid (NATURE-THROID) 81.25 MG TABS Take 81.25 mg by mouth daily.     No current facility-administered medications for this visit.     OBJECTIVE:   Vitals:   01/24/18 0859  BP: 122/60  Pulse: 77  Resp: 19  Temp: 97.8 F (36.6 C)  SpO2: 100%     Body mass index is 18.25 kg/m.    ECOG FS: 2 Filed Weights   01/24/18 0859  Weight: 96 lb 9.6 oz (43.8 kg)  GENERAL: Patient is a well appearing female in no acute distress HEENT:  Sclerae anicteric.  Oropharynx clear and moist. No ulcerations or evidence of oropharyngeal candidiasis. Neck is supple.  NODES:  No cervical, supraclavicular, or axillary lymphadenopathy palpated.  BREAST EXAM:  Deferred. LUNGS:  Clear to auscultation bilaterally.  No wheezes or rhonchi. HEART:  Regular rate and rhythm. No murmur appreciated. ABDOMEN:  Soft, nontender.  Positive, normoactive bowel sounds. No organomegaly palpated. Percutaneous nephrostomy tube and colostomy noted (with appropriate and clean bandaging/bag, etc noted as well) MSK:  No focal spinal tenderness to palpation. Full range of motion bilaterally in the upper extremities. EXTREMITIES:  No peripheral edema.   SKIN:  Clear with no obvious rashes or skin changes. No nail dyscrasia. NEURO:  Nonfocal. Well oriented.  Appropriate affect.    LAB RESULTS:   No results found for: CA125 Lab Results  Component Value Date   LABCA2 181 (H) 10/23/2015  No results found for: CEA    Lab Results  Component Value Date   WBC 4.0 01/22/2018   NEUTROABS 2.5 01/22/2018   HGB 8.5 (L) 01/22/2018   HCT 25.7 (L) 01/22/2018   MCV 94.5 01/22/2018   PLT 402 (H) 01/22/2018      Chemistry      Component Value Date/Time   NA 135 01/22/2018 1222   NA 134 (L) 12/28/2016  1503   K 4.4 01/22/2018 1222   K 4.0 12/28/2016 1503   CL 97 (L) 01/22/2018 1222   CL 98 12/03/2012 1419   CO2 26 01/22/2018 1222   CO2 25 12/28/2016 1503   BUN 25 (H) 01/22/2018 1222  BUN 12.0 12/28/2016 1503   CREATININE 1.24 (H) 01/22/2018 1222   CREATININE 0.81 09/13/2017 1157   CREATININE 0.8 12/28/2016 1503      Component Value Date/Time   CALCIUM 9.8 01/22/2018 1222   CALCIUM 9.7 12/28/2016 1503   ALKPHOS 69 01/22/2018 1222   ALKPHOS 76 12/28/2016 1503   AST 23 01/22/2018 1222   AST 25 09/13/2017 1157   AST 22 12/28/2016 1503   ALT 7 01/22/2018 1222   ALT 12 09/13/2017 1157   ALT 20 12/28/2016 1503   BILITOT 0.3 01/22/2018 1222   BILITOT 0.6 09/13/2017 1157   BILITOT 0.50 12/28/2016 1503      STUDIES: Ct Renal Stone Study  Result Date: 01/13/2018 CLINICAL DATA:  Pt with pelvic pain and hematuria, no hx renal stones, hx bladder spasms Hx left breast ca with mastectomy, left nephrectomy, gastrostomy, colectomy, CKD. 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 EXAM: CT ABDOMEN AND PELVIS WITHOUT CONTRAST TECHNIQUE: Multidetector CT imaging of the abdomen and pelvis was performed following the standard protocol without IV contrast. COMPARISON:  CT of the abdomen and pelvis on 12/01/2017 FINDINGS: Lower chest: Trace RIGHT pleural effusion. Heart size is normal. No pericardial effusion or significant coronary artery calcifications. LEFT mastectomy. Hepatobiliary: The liver is homogeneous. Gallbladder is decompressed. Pancreas: Unremarkable. No pancreatic ductal dilatation or surrounding inflammatory changes. Spleen: Normal in size without focal abnormality. Adrenals/Urinary Tract: LEFT nephrectomy. RIGHT adrenal gland is normal. There is moderate RIGHT hydronephrosis, increased slightly since prior study. The RIGHT proximal ureter is dilated. The distal  ureter is likely occluded by pelvic mass and irregular thickening in the posterior bladder wall, LEFT greater than RIGHT. Stomach/Bowel: Gastrostomy tube in place. Marked rectal wall thickening again noted. Thickening of the distal transverse colon proximal to the LEFT mid abdominal ostomy. Vascular/Lymphatic: Minimal atherosclerotic calcification of the abdominal aorta. Evaluation adenopathy is limited by lack of intravenous contrast. Reproductive: Poorly defined soft tissue mass within the cul-de-sac and involving the bladder, measuring at least 7.8 x 5.3 centimeters. There is adjacent abnormal thickening/mass of the rectosigmoid colon. Other: Ascites.  Anasarca. Musculoskeletal: No acute or significant osseous findings. IMPRESSION: 1. Persistent significant ascites, consistent with known carcinomatosis. 2. Slight increase in RIGHT hydronephrosis. Pelvic mass is probably stable accounting for differences in technique. 3. Marked thickening of the rectosigmoid colon. Distal transverse colon is mildly thickened proximal to the ostomy. 4. Trace RIGHT pleural effusion. 5. LEFT nephrectomy. Electronically Signed   By: Nolon Nations M.D.   On: 01/13/2018 15:28   [place] Ascites (abdomen Limited)  Result Date: 01/14/2018 CLINICAL DATA:  62 year old female with metastatic breast cancer and growing abdominal distension. Evaluate for ascites. EXAM: LIMITED ABDOMEN ULTRASOUND FOR ASCITES TECHNIQUE: Limited ultrasound survey for ascites was performed in all four abdominal quadrants. COMPARISON:  None. FINDINGS: Sonographic interrogation of the 4 quadrants of the abdomen demonstrates only trace ascites. Paracentesis was deferred. IMPRESSION: Minimal ascites, insufficient for paracentesis. Increase in abdominal distension appears to be secondary to increasing peritoneal carcinomatosis rather than ascitic fluid. Electronically Signed   By: Jacqulynn Cadet M.D.   On: 01/14/2018 13:57   Ir Nephrostomy Placement Right  Result  Date: 01/14/2018 INDICATION: 62 year old female with a solitary right kidney and obstructed hydronephrosis secondary to progressive metastatic breast cancer in the pelvis. She has acute kidney injury and requires urgent percutaneous nephrostomy tube placement for renal decompression and to preserve renal function. EXAM:  IR NEPHROSTOMY PLACEMENT RIGHT COMPARISON:  None. MEDICATIONS: 2 g Ancef; The antibiotic was administered in an appropriate time frame prior to skin puncture. ANESTHESIA/SEDATION: Fentanyl 100 mcg IV; Versed 2 mg IV Moderate Sedation Time:  10 minutes The patient was continuously monitored during the procedure by the interventional radiology nurse under my direct supervision. CONTRAST:  10 mL Isovue-300-administered into the collecting system(s) FLUOROSCOPY TIME:  Fluoroscopy Time: 0 minutes 36 seconds (3 mGy). COMPLICATIONS: None immediate. TECHNIQUE: The procedure, risks, benefits, and alternatives were explained to the patient. Questions regarding the procedure were encouraged and answered. The patient understands and consents to the procedure. The right flank was prepped with chlorhexidine in a sterile fashion, and a sterile drape was applied covering the operative field. A sterile gown and sterile gloves were used for the procedure. Local anesthesia was provided with 1% Lidocaine. The right flank was interrogated with ultrasound and the left kidney identified. The kidney is hydronephrotic. A suitable access site on the skin overlying the lower pole, posterior calix was identified. After local mg anesthesia was achieved, a small skin nick was made with an 11 blade scalpel. A 21 gauge Accustick needle was then advanced under direct sonographic guidance into the lower pole of the right kidney. A 0.018 inch wire was advanced under fluoroscopic guidance into the left renal collecting system. The Accustick sheath was then advanced over the wire and a 0.018 system exchanged for a 0.035 system. Gentle  hand injection of contrast material confirms placement of the sheath within the renal collecting system. There is moderate hydronephrosis. The tract from the scan into the renal collecting system was then dilated serially to 10-French. A 10-French Cook all-purpose drain was then placed and positioned under fluoroscopic guidance. The locking loop is well formed within the left renal pelvis. The catheter was secured to the skin with 2-0 Prolene and a sterile bandage was placed. Catheter was left to gravity bag drainage. IMPRESSION: Successful placement of a right 10 French percutaneous nephrostomy tube. Electronically Signed   By: Jacqulynn Cadet M.D.   On: 01/14/2018 14:42     ASSESSMENT: 62 y.o.  BRCA 1-2 negative [facility] woman with lobular breast cancer stage IV at presentation July 2011  (1) status post left mastectomy and sentinel lymph node sampling in July 2011 for a lower inner quadrant T1 N1 M1, stage IV invasive lobular breast cancer, grade 1, strongly estrogen and progesterone receptor-positive, HER2 negative with MIB-1 of 9% and no HER2 amplification,   (2) with multiple sclerotic bone lesions at presentation seen only on CT scan (not on bone scan or PET scan), but  with biopsy-proven metastatic disease to bone; and an elevated CA 27.29 at presentation,   (3) Oncotype recurrence score of 4, predicts a good response to antiestrogens.  (4) Systemic treatment has consisted of  a) tamoxifen with evidence of response but poor tolerance  b) letrozole starting August 2012, discontinued October 2014 per patient   (5) single functioning kidney  (6) status post bilateral salpingo-oophorectomy 01/24/2013, with benign pathology  (7) osteoporosis; the patient refuses bisphosphonate therapy; started [facility] February 2014.    (a) bone density was obtained under the care of Dr. Matthew Hill at [facility], 06/25/2012, showing osteoporosis with a T score -2.6. This was  repeated 12/20/2013, showing again osteoporosis with T-scores between -2.4 and -2.8.  (b) the patient refuses zolendronate, denosumab, or other pharmacologic intervention  (c) bone density on 12/30/2015 under Dr. Matthew Hill showing osteoporosis with T scores between -  2.7 and -3.3  (8) the patient refuses standard Mammography or tomography; undergoing thermography screening of the right breast.  (9) PET scan 06/05/2015 shows rectal thickening and a presacral mass; biopsy of this area 10/09/2015 confirms metastatic lobular breast cancer, again estrogen receptor positive, HER-2 nonamplified.  (a) pelvic MRI 03/11/2016 confirms stability of disease  (b) chest CT and pelvic MRI 09/02/2016 shows no change in the circumferential rectal thickening or evidence of extension beyond the serosa and chest CT findings  (c) colonoscopy 02/23/2017 showed a very narrow rectal lumen with a few apparently uninvolved centimeters distally   (d) CT of the abdomen and pelvis 05/22/2017 (after diverting colostomy) shows interval increase in the size of the perirectal soft tissue masses.  (10) started fulvestrant and palbociclib 125 mg/ day [21/7] May 2017  (a) palbociclib dose decreased to 100 mg daily [21/7] with second cycle, started 11/25/2015  (b) palbociclib discontinued 03/20/2017 with evidence of disease progression  (c) last fulvestrant dose 03/24/2017, discontinued with evidence of progression  (11) status post colostomy placement [facility] November 2018  (12) started exemestane 07/05/2017,  (a) everolimus 5 mg/d started 07/14/2017--held as of 08/20/2017 secondary to rash  (b) exemestane stopped by patient 09/04/2017 to try "the natural way".  13) exemestane resumed 10/16/2017             (a) abemaciclib/Verzenio added 11/29/2017  (14) s/p laparoscopic small bowel resection for SBO 11/08/2017             (a) peritoneal fluid 11/17/2017 growing enterococcus, vanc sensitive  (b) completed  antibiotics 11/27/2017    PLAN:  Aizley is doing moderately well today.  I reviewed her labs with her.  She is slightly anemic and fatigued.  Her kidney function has improved since during her hospitalization and is now 1.36. Her tumor marker has risen to 736.   She met with Dr. Jana Hill who reviewed her labs and plan with her today.  She will go ahead and restart Abemaciclib, and receive one unit of blood sometime this week for her fatigue.  She will continue on Exemestane.    We will see Alvilda back in 2-3 weeks for labs and f/u.  She knows to call for any questions or concerns that may arise between now and her next appointment.    A total of (30) minutes of face-to-face time was spent with this patient with greater than 50% of that time in counseling and care-coordination.  Sonia Bihari, NP 01/24/18 9:04 AM Medical Oncology and Hematology [facility] [address] [place], [place] [address] Tel. [phone]    Fax. [phone]   ADDENDUM: Eurydice's situation is distressing.  She continues to develop implications from her cancer and doubtless her acute kidney injury will not be the last.  She understands that there are 4 series of tubes that can be blocked in the abdomen and 2 of them, the ureters and the bowel, have already had to be bypassed.  She could have biliary obstruction or she could develop clotting in some of the abdominal blood vessels.  Clinically she has benefited greatly from both the bypass procedures.  She is able to take food by mouth, and her kidney function is greatly improving.  It is not encouraging that her CA 96789 has risen so fast, but on the other hand she has not resumed her abemaciclib.  She is now ready to do that and I am hopeful over the next few weeks or perhaps 2 or  3 months we will see some evidence of disease control.  I personally saw this patient and performed a substantive portion of this encounter with the listed APP documented  above.   Chauncey Cruel, MD Medical Oncology and Hematology [facility] [address] [place], [place] [address] Tel. [phone]    Fax. [phone]

## 2018-01-26 ENCOUNTER — Inpatient Hospital Stay: Payer: 59

## 2018-01-26 ENCOUNTER — Other Ambulatory Visit: Payer: Self-pay

## 2018-01-26 DIAGNOSIS — D649 Anemia, unspecified: Secondary | ICD-10-CM

## 2018-01-26 DIAGNOSIS — C50312 Malignant neoplasm of lower-inner quadrant of left female breast: Secondary | ICD-10-CM | POA: Diagnosis not present

## 2018-01-26 LAB — ABO/RH: ABO/RH(D): O POS

## 2018-01-26 LAB — PREPARE RBC (CROSSMATCH)

## 2018-01-26 MED ORDER — DIPHENHYDRAMINE HCL 25 MG PO CAPS
ORAL_CAPSULE | ORAL | Status: AC
  Filled 2018-01-26: qty 1

## 2018-01-26 MED ORDER — ACETAMINOPHEN 325 MG PO TABS
ORAL_TABLET | ORAL | Status: AC
  Filled 2018-01-26: qty 2

## 2018-01-26 MED ORDER — DIPHENHYDRAMINE HCL 25 MG PO CAPS
25.0000 mg | ORAL_CAPSULE | Freq: Once | ORAL | Status: AC
  Administered 2018-01-26: 25 mg via ORAL

## 2018-01-26 MED ORDER — ACETAMINOPHEN 325 MG PO TABS
650.0000 mg | ORAL_TABLET | Freq: Once | ORAL | Status: AC
  Administered 2018-01-26: 650 mg via ORAL

## 2018-01-26 NOTE — Patient Instructions (Signed)

## 2018-01-26 NOTE — Progress Notes (Signed)
Type and screen order and Prepare order released for 1 unit of PRBC.

## 2018-01-27 LAB — TYPE AND SCREEN
ABO/RH(D): O POS
Antibody Screen: NEGATIVE
Unit division: 0

## 2018-01-27 LAB — BPAM RBC
BLOOD PRODUCT EXPIRATION DATE: 201909152359
ISSUE DATE / TIME: 201908161503
UNIT TYPE AND RH: 5100

## 2018-01-30 ENCOUNTER — Ambulatory Visit: Payer: 59 | Admitting: Physical Therapy

## 2018-01-30 ENCOUNTER — Ambulatory Visit: Payer: 59 | Admitting: Adult Health

## 2018-01-30 ENCOUNTER — Other Ambulatory Visit: Payer: 59

## 2018-01-30 DIAGNOSIS — R262 Difficulty in walking, not elsewhere classified: Secondary | ICD-10-CM | POA: Insufficient documentation

## 2018-01-30 DIAGNOSIS — M6281 Muscle weakness (generalized): Secondary | ICD-10-CM | POA: Diagnosis present

## 2018-01-30 DIAGNOSIS — I890 Lymphedema, not elsewhere classified: Secondary | ICD-10-CM | POA: Insufficient documentation

## 2018-01-30 NOTE — Therapy (Signed)
[place], [place], [address] Phone: [phone]   Fax:  [phone]  Physical Therapy Treatment  Patient Details  Name: Sonia Hill MRN: [id number] Date of Birth: [birth year] Referring Provider: Dr. Vinie Sill    Encounter Date: 01/30/2018  PT End of Session - 01/30/18 1256    Visit Number  4    Number of Visits  16    Date for PT Re-Evaluation  03/02/18    PT Start Time  1100    PT Stop Time  1145    PT Time Calculation (min)  45 min    Activity Tolerance  Patient tolerated treatment well    Behavior During Therapy  [facility] for tasks assessed/performed       Past Medical History:  Diagnosis Date  . Arthritis    neck, upper back, shoulders - no meds - yoga  . Chronic kidney disease    Only has right kidney  . Depression    Hx - no current problem  . GERD (gastroesophageal reflux disease)    diet controlled - no meds  . Hyperlipidemia    diet controlled  . Hypothyroidism   . lt breast ca dx'd 11/2009  . Osteoporosis, unspecified 12/06/2013  . PONV (postoperative nausea and vomiting)   . SBO (small bowel obstruction) ([place])   . SVD (spontaneous vaginal delivery)    x 1    Past Surgical History:  Procedure Laterality Date  . ABDOMINAL SURGERY    . BLADDER NECK RECONSTRUCTION     made a new urethea tube  . bladder tact  2008  . BOWEL RESECTION N/A 11/08/2017   Procedure: SMALL BOWEL RESECTION;  Surgeon: Michael Boston, MD;  [facility];  Service: General;  Laterality: N/A;  . BREAST SURGERY     masectomy left breast  . coloscopy    . COLOSTOMY    . DILATION AND CURETTAGE OF UTERUS    . GASTROSTOMY N/A 11/08/2017   Procedure: INSERTION OF GASTROSTOMY TUBE;  Surgeon: Michael Boston, MD;  [facility];  Service: General;  Laterality: N/A;  . IR NEPHROSTOMY PLACEMENT RIGHT  01/14/2018  . LAPAROSCOPY N/A 01/24/2013   Procedure: LAPROSCOPY OPERATIVE;  Surgeon: Margarette Asal, MD;   Location: [facility];  Service: Gynecology;  Laterality: N/A;  . LAPAROSCOPY N/A 11/08/2017   Procedure: LAPAROSCOPY DIAGNOSTIC;  Surgeon: Michael Boston, MD;  [facility];  Service: General;  Laterality: N/A;  . LYSIS OF ADHESION N/A 11/08/2017   Procedure: LYSIS OF ADHESION;  Surgeon: Michael Boston, MD;  [facility];  Service: General;  Laterality: N/A;  . NEPHRECTOMY Left    left kidney dysfunction, recurrent pyelonephritis  . PARTIAL COLECTOMY N/A 11/08/2017   Procedure: PARTIAL COLECTOMY WITH NEW COLOSTOMY;  Surgeon: Michael Boston, MD;  [facility];  Service: General;  Laterality: N/A;  . SALPINGOOPHORECTOMY Bilateral 01/24/2013   Procedure: SALPINGO OOPHORECTOMY;  Surgeon: Margarette Asal, MD;  Location: [facility];  Service: Gynecology;  Laterality: Bilateral;  . WISDOM TOOTH EXTRACTION      There were no vitals filed for this visit.  Subjective Assessment - 01/30/18 1118    Subjective  Pt reports she was hospitalized in early August for acute kidney failure and has a permanent nephrostomy tube. She no longer has the problem with lymphedema in her left leg. She feels that she does not have good endurance and wants to get stronger with upper body and legs. She was not able to  go to [place] due to her hospitalization and is hoping she can go at the end of October     Pertinent History  BRCA 1-2 negative with lobular breast cancer stage IV at presentation July 2011 status post left mastectomy and sentinel lymph node sampling in July 2011 for a lower inner quadrant T1 N1 M1, stage IV invasive lobular breast cancer, grade 1, strongly estrogen and progesterone receptor-positive, HER2 negative with MIB-1 of 9% and no HER2 amplification, with multiple sclerotic bone lesions at presentation seen only on CT scan (not on bone scan or PET scan), but  with biopsy-proven metastatic disease to bone; and an elevated CA 27.29 at presentation, single functioning kidney, status post bilateral  salpingo-oophorectomy 01/24/2013, with benign pathology osteoporosis; the patient refuses bisphosphonate therapy; started [facility] February 2014, T score -2.7 and -3., s/p colostomy, recent abdominal cellulits    Patient Stated Goals  to have more endurance     Currently in Pain?  Yes    Pain Score  4     Pain Location  Pelvis    Pain Orientation  Lower    Pain Descriptors / Indicators  Discomfort         [facility] PT Assessment - 01/30/18 0001      Assessment   Medical Diagnosis  metastatic left breast cancer     Referring Provider  Dr. Vinie Sill     Onset Date/Surgical Date  11/24/09      Prior Function   Level of Independence  Independent      Sit to Stand   Comments  12 repetitions in 30 seconds       Timed Up and Go Test   Normal TUG (seconds)  6.7        LYMPHEDEMA/ONCOLOGY QUESTIONNAIRE - 01/30/18 1126      Right Lower Extremity Lymphedema   10 cm Proximal to Suprapatella  34.5 cm    At Midpatella/Popliteal Crease  30.1 cm    30 cm Proximal to Floor at Lateral Plantar Foot  28.6 cm    20 cm Proximal to Floor at Lateral Plantar Foot  _0 cm Proximal to Floor at Lateral Malleoli  18.5 cm    5 cm Proximal to 1st MTP Joint  20 cm    Across MTP Joint  19 cm    Around Proximal Great Toe  6.5 cm      Left Lower Extremity Lymphedema   20 cm Proximal to Suprapatella  37 cm    10 cm Proximal to Suprapatella  31 cm    At Midpatella/Popliteal Crease  31 cm    30 cm Proximal to Floor at Lateral Plantar Foot  30.5 cm    20 cm Proximal to Floor at Lateral Plantar Foot  25 cm    10 cm Proximal to Floor at Lateral Malleoli  20.2 cm    5 cm Proximal to 1st MTP Joint  20.5 cm    Across MTP Joint  20 cm    Around Proximal Great Toe  6.5 cm           Outpatient Re[facility]  Lymphedema Life Impact Scale Total Score  60.29 %           [facility] Adult PT Treatment/Exercise - 01/30/18 0001      Self-Care    Self-Care  Other Self-Care Comments    Other Self-Care Comments   reviewed elevation, exercise and skin care for  mild swelling in left ankle and lower leg.  Encouraged pt to take small bouts of exercise throughout the day and consider strategies to help her find more activity her day       Manual Therapy   Edema Management  remeasurement of both legs and application of thick moisturinzing cream to left lower leg                   PT Long Term Goals - 01/30/18 1250      PT LONG TERM GOAL #1   Title  Pt will receive appropriate compression garments for management of bilateral LE, genital and abdominal swelling to decrease risk of infection    Status  Achieved      PT LONG TERM GOAL #2   Title  Pt will be independent in self MLD for long term management of lymphedema    Status  Deferred      PT LONG TERM GOAL #3   Title  Pt will report a 25% improvmement in bilateral LE, swelling to allow improved comfort    Status  Deferred      PT LONG TERM GOAL #4   Title  Pt will report a 25% improvmement in bilateral LE, swelling to allow improved comfort    Status  Deferred      PT LONG TERM GOAL #5   Title  Pt will reports she has about 50% more endurance allowing her to do more activities at home.     Time  4    Period  Weeks    Status  New      Additional Long Term Goals   Additional Long Term Goals  Yes      PT LONG TERM GOAL #6   Title  Pt will impove # of sit to stand to 14 in 30 seconds indicating and improvment in funcitonal strength     Baseline  12    Time  4    Period  Weeks    Status  New      PT LONG TERM GOAL #7   Title  Pt will decrease TUG time to < 6 seconds indicating an improvment in functional mobility and balance     Baseline  6.7 sec    Time  4    Period  Weeks    Status  New      PT LONG TERM GOAL #8   Title  Pt will be independent in a home exercise program for strength and endurance     Time  4    Period  Weeks    Status  New             Plan - 01/30/18 1245    Clinical Impression Statement  Pt has had a hospitalization for acute kidney failure after mechanical obstruction of ureter and bladder from tumor.  She has rapid reduction of left leg swelling once the obstruction was elminiated with a nephostomy tube.  Pt no longer needs treatment for LE swelling but she want to increase strength and endurance so that she can still take the trip to [facility] that had to be rescheduled to October.  Renewal sent with new goals.     Rehab Potential  Good    Clinical Impairments Affecting Rehab Potential  1 kidney functioning, metastatic disease, osteoporosis, genital, abdominal and bilateral LE swelling    PT Treatment/Interventions  ADLs/Self Care Home Management;Therapeutic activities;Therapeutic exercise;Patient/family education;Manual lymph drainage;Manual techniques;Compression bandaging;Passive range of motion;Vasopneumatic  Device;Taping;Orthotic Fit/Training    PT Next Visit Plan  begin strengthening and endurance exercise instruct in home exercise program     Consulted and Agree with Plan of Care  Patient       Patient will benefit from skilled therapeutic intervention in order to improve the following deficits and impairments:  Increased edema, Difficulty walking, Decreased knowledge of precautions, Decreased endurance, Decreased strength, Pain, Decreased activity tolerance, Impaired perceived functional ability, Decreased mobility  Visit Diagnosis: Muscle weakness (generalized) - Plan: PT plan of care cert/re-cert  Difficulty in walking, not elsewhere classified - Plan: PT plan of care cert/re-cert     Problem List Patient Active Problem List   Diagnosis Date Noted  . CKD (chronic kidney disease), stage III ([place]) 01/15/2018  . Acute lower UTI 01/14/2018  . Pelvic mass 01/14/2018  . Leukopenia due to antineoplastic chemotherapy ([place]) 01/14/2018  . SIRS (systemic inflammatory response syndrome) ([place]) 01/14/2018   . Acute kidney injury superimposed on CKD ([place]) 01/13/2018  . ARF (acute renal failure) ([place]) 01/13/2018  . Acute kidney injury ([place])   . Gross hematuria   . Anemia 11/12/2017  . Malnutrition of moderate degree 11/11/2017  . Metastatic breast cancer to small intestine causing SBO s/p SB resection 11/08/2017 11/10/2017  . 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 Cachexia ([place]) 11/10/2017  . New onset left bundle branch block (LBBB) 11/09/2017  . Colostomy obstruction from metastasis s/p colectomy/ostomy revision 11/08/2017 11/08/2017  . 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 Ascites 11/08/2017  . Hypoglycemia 11/04/2017  . Hypokalemia 11/04/2017  . Hypocalcemia 11/04/2017  . Small bowel obstruction from metastasis s/p SB resection 11/08/2017 11/03/2017  . Obstructive nephropathy 10/24/2017  . 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 Hyponatremia 10/16/2017  . Protein-calorie malnutrition, severe 07/11/2017  . Breast cancer metastasized to multiple sites ([place]) 10/16/2015  . Genetic testing 06/24/2015  . Osteoporosis 12/06/2013  . Cancer of lower-inner quadrant of left female breast ([place]) 03/22/2013  . Breast cancer metastasized to bone ([place]) 10/03/2012   Donato Heinz. Owens Shark PT  Norwood Levo 01/30/2018, 12:58 PM  [place], [place], [address] Phone: [phone]   Fax:  [phone]  Name: Sonia Hill MRN: [id number] Date of Birth: Nov 23, 1955

## 2018-02-01 ENCOUNTER — Ambulatory Visit: Payer: 59 | Admitting: Physical Therapy

## 2018-02-01 ENCOUNTER — Encounter: Payer: Self-pay | Admitting: Physical Therapy

## 2018-02-01 DIAGNOSIS — M6281 Muscle weakness (generalized): Secondary | ICD-10-CM

## 2018-02-01 DIAGNOSIS — R262 Difficulty in walking, not elsewhere classified: Secondary | ICD-10-CM

## 2018-02-01 DIAGNOSIS — I890 Lymphedema, not elsewhere classified: Secondary | ICD-10-CM | POA: Diagnosis not present

## 2018-02-01 NOTE — Therapy (Signed)
[place], [place], [address] Phone: [phone]   Fax:  [phone]  Physical Therapy Treatment  Patient Details  Name: Sonia Hill MRN: [id number] Date of Birth: Nov 10, 1955 Referring Provider: Dr. Vinie Sill    Encounter Date: 02/01/2018  PT End of Session - 02/01/18 1150    Visit Number  5    Number of Visits  16    Date for PT Re-Evaluation  03/02/18    PT Start Time  1100    PT Stop Time  1145    PT Time Calculation (min)  45 min    Activity Tolerance  Patient tolerated treatment well    Behavior During Therapy  [facility] for tasks assessed/performed       Past Medical History:  Diagnosis Date  . Arthritis    neck, upper back, shoulders - no meds - yoga  . Chronic kidney disease    Only has right kidney  . Depression    Hx - no current problem  . GERD (gastroesophageal reflux disease)    diet controlled - no meds  . Hyperlipidemia    diet controlled  . Hypothyroidism   . lt breast ca dx'd 11/2009  . Osteoporosis, unspecified 12/06/2013  . PONV (postoperative nausea and vomiting)   . SBO (small bowel obstruction) ([place])   . SVD (spontaneous vaginal delivery)    x 1    Past Surgical History:  Procedure Laterality Date  . ABDOMINAL SURGERY    . BLADDER NECK RECONSTRUCTION     made a new urethea tube  . bladder tact  2008  . BOWEL RESECTION N/A 11/08/2017   Procedure: SMALL BOWEL RESECTION;  Surgeon: Michael Boston, MD;  [facility];  Service: General;  Laterality: N/A;  . BREAST SURGERY     masectomy left breast  . coloscopy    . COLOSTOMY    . DILATION AND CURETTAGE OF UTERUS    . GASTROSTOMY N/A 11/08/2017   Procedure: INSERTION OF GASTROSTOMY TUBE;  Surgeon: Michael Boston, MD;  [facility];  Service: General;  Laterality: N/A;  . IR NEPHROSTOMY PLACEMENT RIGHT  01/14/2018  . LAPAROSCOPY N/A 01/24/2013   Procedure: LAPROSCOPY OPERATIVE;  Surgeon: Margarette Asal, MD;   Location: [facility];  Service: Gynecology;  Laterality: N/A;  . LAPAROSCOPY N/A 11/08/2017   Procedure: LAPAROSCOPY DIAGNOSTIC;  Surgeon: Michael Boston, MD;  [facility];  Service: General;  Laterality: N/A;  . LYSIS OF ADHESION N/A 11/08/2017   Procedure: LYSIS OF ADHESION;  Surgeon: Michael Boston, MD;  [facility];  Service: General;  Laterality: N/A;  . NEPHRECTOMY Left    left kidney dysfunction, recurrent pyelonephritis  . PARTIAL COLECTOMY N/A 11/08/2017   Procedure: PARTIAL COLECTOMY WITH NEW COLOSTOMY;  Surgeon: Michael Boston, MD;  [facility];  Service: General;  Laterality: N/A;  . SALPINGOOPHORECTOMY Bilateral 01/24/2013   Procedure: SALPINGO OOPHORECTOMY;  Surgeon: Margarette Asal, MD;  Location: [facility];  Service: Gynecology;  Laterality: Bilateral;  . WISDOM TOOTH EXTRACTION      There were no vitals filed for this visit.  Subjective Assessment - 02/01/18 1108    Subjective  Pt states she has had some leakage from the tube last night in her bed     Pertinent History  BRCA 1-2 negative with lobular breast cancer stage IV at presentation July 2011 status post left mastectomy and sentinel lymph node sampling in July 2011 for a lower inner quadrant T1 N1  M1, stage IV invasive lobular breast cancer, grade 1, strongly estrogen and progesterone receptor-positive, HER2 negative with MIB-1 of 9% and no HER2 amplification, with multiple sclerotic bone lesions at presentation seen only on CT scan (not on bone scan or PET scan), but  with biopsy-proven metastatic disease to bone; and an elevated CA 27.29 at presentation, single functioning kidney, status post bilateral salpingo-oophorectomy 01/24/2013, with benign pathology osteoporosis; the patient refuses bisphosphonate therapy; started [facility] February 2014, T score -2.7 and -3., s/p colostomy, recent abdominal cellulits    Patient Stated Goals  to have more endurance     Currently in Pain?  Yes    Pain Score  4     Pain  Location  Pelvis    Pain Orientation  Lower    Pain Descriptors / Indicators  Discomfort                  Outpatient Re[facility] from [facility]  Lymphedema Life Impact Scale Total Score  60.29 %           [facility] Adult PT Treatment/Exercise - 02/01/18 0001      Elbow Exercises   Elbow Flexion  Strengthening;Both;Other reps (comment);Bar weights/barbell   10 with both, 10 with each separately, 10 alternating    Bar Weights/Barbell (Elbow Flexion)  1 lb    Elbow Flexion Limitations  sitting on red disc for core exchange.       Neck Exercises: Seated   Other Seated Exercise  stretches for flexion, extension, rotation and sidel bending , backward shoulder rolls , deep breating       Knee/Hip Exercises: Seated   Long Arc Quad  AROM;Both;10 reps    [facility] [facility]  10 reps with purple ball     Clamshell with TheraBand  Yellow   10 reps      Knee/Hip Exercises: Supine   Short Arc Quad Sets  Strengthening;Right;Left;3 sets;10 reps   "popping" non painful at left medial knee    Short Arc Quad Sets Limitations  2# with cues to move slowly     Hip Adduction Isometric  Both;1 set;10 reps    Straight Leg Raises  Right;Left;5 reps   cues to quad set, lift an inch or two, lower relax.     Shoulder Exercises: Seated   Diagonals  Right;Left;10 reps    Theraband Level (Shoulder Diagonals)  Level 1 (Yellow)      Ankle Exercises: Standing   Heel Raises  5 reps;Both      Ankle Exercises: Seated   Towel Crunch  3 reps    Towel Inversion/Eversion  5 reps    Heel Raises  5 reps;Both                  PT Long Term Goals - 01/30/18 1250      PT LONG TERM GOAL #1   Title  Pt will receive appropriate compression garments for management of bilateral LE, genital and abdominal swelling to decrease risk of infection    Status  Achieved      PT LONG TERM GOAL #2   Title  Pt will be independent in self MLD for long term management  of lymphedema    Status  Deferred      PT LONG TERM GOAL #3   Title  Pt will report a 25% improvmement in bilateral LE, swelling to allow improved comfort    Status  Deferred      PT LONG  TERM GOAL #4   Title  Pt will report a 25% improvmement in bilateral LE, swelling to allow improved comfort    Status  Deferred      PT LONG TERM GOAL #5   Title  Pt will reports she has about 50% more endurance allowing her to do more activities at home.     Time  4    Period  Weeks    Status  New      Additional Long Term Goals   Additional Long Term Goals  Yes      PT LONG TERM GOAL #6   Title  Pt will impove # of sit to stand to 14 in 30 seconds indicating and improvment in funcitonal strength     Baseline  12    Time  4    Period  Weeks    Status  New      PT LONG TERM GOAL #7   Title  Pt will decrease TUG time to < 6 seconds indicating an improvment in functional mobility and balance     Baseline  6.7 sec    Time  4    Period  Weeks    Status  New      PT LONG TERM GOAL #8   Title  Pt will be independent in a home exercise program for strength and endurance     Time  4    Period  Weeks    Status  New            Plan - 02/01/18 1150    Clinical Impression Statement  Pt was able to tolerate initial exercise session well today with a rate of perceived exertion about a 5/10. She was able to activate her core muscles for stability while doing UE and LE exercise.  She is motivated to continue exercising at home for about 30 minutes in the am and trying to take a 10 minute walk in the evening     Clinical Impairments Affecting Rehab Potential  1 kidney functioning, metastatic disease, osteoporosis, genital, abdominal and bilateral LE swelling    PT Frequency  2x / week    PT Duration  3 weeks    PT Next Visit Plan  continue  strengthening and endurance exercise instruct in home exercise program     Consulted and Agree with Plan of Care  Patient       Patient will benefit from  skilled therapeutic intervention in order to improve the following deficits and impairments:  Increased edema, Difficulty walking, Decreased knowledge of precautions, Decreased endurance, Decreased strength, Pain, Decreased activity tolerance, Impaired perceived functional ability, Decreased mobility  Visit Diagnosis: Muscle weakness (generalized)  Difficulty in walking, not elsewhere classified     Problem List Patient Active Problem List   Diagnosis Date Noted  . CKD (chronic kidney disease), stage III ([place]) 01/15/2018  . Acute lower UTI 01/14/2018  . Pelvic mass 01/14/2018  . Leukopenia due to antineoplastic chemotherapy ([place]) 01/14/2018  . SIRS (systemic inflammatory response syndrome) ([place]) 01/14/2018  . Acute kidney injury superimposed on CKD ([place]) 01/13/2018  . ARF (acute renal failure) ([place]) 01/13/2018  . Acute kidney injury ([place])   . Gross hematuria   . Anemia 11/12/2017  . Malnutrition of moderate degree 11/11/2017  . Metastatic breast cancer to small intestine causing SBO s/p SB resection 11/08/2017 11/10/2017  . 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 Cachexia ([place]) 11/10/2017  . New onset left  bundle branch block (LBBB) 11/09/2017  . Colostomy obstruction from metastasis s/p colectomy/ostomy revision 11/08/2017 11/08/2017  . 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 Ascites 11/08/2017  . Hypoglycemia 11/04/2017  . Hypokalemia 11/04/2017  . Hypocalcemia 11/04/2017  . Small bowel obstruction from metastasis s/p SB resection 11/08/2017 11/03/2017  . Obstructive nephropathy 10/24/2017  . 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 Hyponatremia 10/16/2017  . Protein-calorie malnutrition, severe 07/11/2017  . Breast cancer metastasized to multiple sites ([place]) 10/16/2015  . Genetic testing 06/24/2015  . Osteoporosis 12/06/2013  . Cancer of lower-inner quadrant of left female breast  ([place]) 03/22/2013  . Breast cancer metastasized to bone ([place]) 10/03/2012   Donato Heinz. Owens Shark PT  Norwood Levo 02/01/2018, 11:53 AM  [place], [place], [address] Phone: [phone]   Fax:  [phone]  Name: Sonia Hill MRN: [id number] Date of Birth: 1955-10-29

## 2018-02-06 ENCOUNTER — Encounter: Payer: Self-pay | Admitting: Physical Therapy

## 2018-02-06 ENCOUNTER — Ambulatory Visit: Payer: 59 | Admitting: Physical Therapy

## 2018-02-06 ENCOUNTER — Telehealth: Payer: Self-pay | Admitting: *Deleted

## 2018-02-06 DIAGNOSIS — C7951 Secondary malignant neoplasm of bone: Principal | ICD-10-CM

## 2018-02-06 DIAGNOSIS — I890 Lymphedema, not elsewhere classified: Secondary | ICD-10-CM | POA: Diagnosis not present

## 2018-02-06 DIAGNOSIS — N138 Other obstructive and reflux uropathy: Secondary | ICD-10-CM

## 2018-02-06 DIAGNOSIS — M6281 Muscle weakness (generalized): Secondary | ICD-10-CM

## 2018-02-06 DIAGNOSIS — R262 Difficulty in walking, not elsewhere classified: Secondary | ICD-10-CM

## 2018-02-06 DIAGNOSIS — C50919 Malignant neoplasm of unspecified site of unspecified female breast: Secondary | ICD-10-CM

## 2018-02-06 NOTE — Therapy (Signed)
[place], [place], [address] Phone: [phone]   Fax:  [phone]  Physical Therapy Treatment  Patient Details  Name: Sonia Hill MRN: [id number] Date of Birth: 27-Mar-1956 Referring Provider: Dr. Vinie Sill    Encounter Date: 02/06/2018  PT End of Session - 02/06/18 1634    Visit Number  6    Number of Visits  16    Date for PT Re-Evaluation  03/02/18    PT Start Time  1300    PT Stop Time  1345    PT Time Calculation (min)  45 min    Activity Tolerance  Patient tolerated treatment well    Behavior During Therapy  [facility] for tasks assessed/performed       Past Medical History:  Diagnosis Date  . Arthritis    neck, upper back, shoulders - no meds - yoga  . Chronic kidney disease    Only has right kidney  . Depression    Hx - no current problem  . GERD (gastroesophageal reflux disease)    diet controlled - no meds  . Hyperlipidemia    diet controlled  . Hypothyroidism   . lt breast ca dx'd 11/2009  . Osteoporosis, unspecified 12/06/2013  . PONV (postoperative nausea and vomiting)   . SBO (small bowel obstruction) ([place])   . SVD (spontaneous vaginal delivery)    x 1    Past Surgical History:  Procedure Laterality Date  . ABDOMINAL SURGERY    . BLADDER NECK RECONSTRUCTION     made a new urethea tube  . bladder tact  2008  . BOWEL RESECTION N/A 11/08/2017   Procedure: SMALL BOWEL RESECTION;  Surgeon: Michael Boston, MD;  [facility]: [facility];  Service: General;  Laterality: N/A;  . BREAST SURGERY     masectomy left breast  . coloscopy    . COLOSTOMY    . DILATION AND CURETTAGE OF UTERUS    . GASTROSTOMY N/A 11/08/2017   Procedure: INSERTION OF GASTROSTOMY TUBE;  Surgeon: Michael Boston, MD;  [facility];  Service: General;  Laterality: N/A;  . IR NEPHROSTOMY PLACEMENT RIGHT  01/14/2018  . LAPAROSCOPY N/A 01/24/2013   Procedure: LAPROSCOPY OPERATIVE;  Surgeon: Margarette Asal, MD;   Location: [facility];  Service: Gynecology;  Laterality: N/A;  . LAPAROSCOPY N/A 11/08/2017   Procedure: LAPAROSCOPY DIAGNOSTIC;  Surgeon: Michael Boston, MD;  [facility];  Service: General;  Laterality: N/A;  . LYSIS OF ADHESION N/A 11/08/2017   Procedure: LYSIS OF ADHESION;  Surgeon: Michael Boston, MD;  [facility];  Service: General;  Laterality: N/A;  . NEPHRECTOMY Left    left kidney dysfunction, recurrent pyelonephritis  . PARTIAL COLECTOMY N/A 11/08/2017   Procedure: PARTIAL COLECTOMY WITH NEW COLOSTOMY;  Surgeon: Michael Boston, MD;  [facility];  Service: General;  Laterality: N/A;  . SALPINGOOPHORECTOMY Bilateral 01/24/2013   Procedure: SALPINGO OOPHORECTOMY;  Surgeon: Margarette Asal, MD;  Location: [facility];  Service: Gynecology;  Laterality: Bilateral;  . WISDOM TOOTH EXTRACTION      There were no vitals filed for this visit.               Outpatient [facility]  Lymphedema Life Impact Scale Total Score  60.29 %           [facility] Adult PT Treatment/Exercise - 02/06/18 0001      Balance Poses: Yoga   Warrior I  3 reps  Warrior II  3 reps   into triangle pose      Exercises   Exercises  Shoulder      Lumbar Exercises: Aerobic   Elliptical  2 minutes at level one     Other Aerobic Exercise  pt unable to get comfortable on the recumbant bike due to colostomy and collection bag for neprhostomy tube       Knee/Hip Exercises: Supine   Straight Leg Raises  Right;Left;10 reps   cues to quad set, lift an inch or two, lower relax.   Other Supine Knee/Hip Exercises  red theraband around knees  with bilateral leg opening , then one stable and the other open, then with the other leg       Shoulder Exercises: Seated   Other Seated Exercises  scaption with 2 pounds 5 reps x       Shoulder Exercises: Standing   Flexion  10 reps;AROM;Right;Left    ABduction  Strengthening;10 reps;Right;Left     Extension  Right;Left;10 reps    Row  Strengthening;Right;Left;10 reps    Theraband Level (Shoulder Row)  Level 2 (Red)             PT Education - 02/06/18 1633    Education Details  standing hip raises     Person(s) Educated  Patient    Methods  Explanation    Comprehension  Verbalized understanding;Returned demonstration          PT Long Term Goals - 01/30/18 1250      PT LONG TERM GOAL #1   Title  Pt will receive appropriate compression garments for management of bilateral LE, genital and abdominal swelling to decrease risk of infection    Status  Achieved      PT LONG TERM GOAL #2   Title  Pt will be independent in self MLD for long term management of lymphedema    Status  Deferred      PT LONG TERM GOAL #3   Title  Pt will report a 25% improvmement in bilateral LE, swelling to allow improved comfort    Status  Deferred      PT LONG TERM GOAL #4   Title  Pt will report a 25% improvmement in bilateral LE, swelling to allow improved comfort    Status  Deferred      PT LONG TERM GOAL #5   Title  Pt will reports she has about 50% more endurance allowing her to do more activities at home.     Time  4    Period  Weeks    Status  New      Additional Long Term Goals   Additional Long Term Goals  Yes      PT LONG TERM GOAL #6   Title  Pt will impove # of sit to stand to 14 in 30 seconds indicating and improvment in funcitonal strength     Baseline  12    Time  4    Period  Weeks    Status  New      PT LONG TERM GOAL #7   Title  Pt will decrease TUG time to < 6 seconds indicating an improvment in functional mobility and balance     Baseline  6.7 sec    Time  4    Period  Weeks    Status  New      PT LONG TERM GOAL #8   Title  Pt will be independent in a  home exercise program for strength and endurance     Time  4    Period  Weeks    Status  New            Plan - 02/06/18 1634    Clinical Impression Statement  Pt appears much stronger today and  was a able to advance exercise to the elliptical and standing exercise as well as some yoga poses she was familiar with.  Continues leg strengthening also.     Clinical Impairments Affecting Rehab Potential  1 kidney functioning, metastatic disease, osteoporosis, genital, abdominal and bilateral LE swelling    PT Frequency  2x / week    PT Duration  3 weeks    PT Treatment/Interventions  ADLs/Self Care Home Management;Therapeutic activities;Therapeutic exercise;Patient/family education;Manual lymph drainage;Manual techniques;Compression bandaging;Passive range of motion;Vasopneumatic Device;Taping;Orthotic Fit/Training    PT Next Visit Plan  review supine scapualr series. Give transverse abds handout. continue  strengthening and endurance exercise instruct in home exercise program     Consulted and Agree with Plan of Care  Patient       Patient will benefit from skilled therapeutic intervention in order to improve the following deficits and impairments:  Increased edema, Difficulty walking, Decreased knowledge of precautions, Decreased endurance, Decreased strength, Pain, Decreased activity tolerance, Impaired perceived functional ability, Decreased mobility  Visit Diagnosis: Muscle weakness (generalized)  Difficulty in walking, not elsewhere classified     Problem List Patient Active Problem List   Diagnosis Date Noted  . CKD (chronic kidney disease), stage III ([place]) 01/15/2018  . Acute lower UTI 01/14/2018  . Pelvic mass 01/14/2018  . Leukopenia due to antineoplastic chemotherapy ([place]) 01/14/2018  . SIRS (systemic inflammatory response syndrome) ([place]) 01/14/2018  . Acute kidney injury superimposed on CKD ([place]) 01/13/2018  . ARF (acute renal failure) ([place]) 01/13/2018  . Acute kidney injury ([place])   . Gross hematuria   . Anemia 11/12/2017  . Malnutrition of moderate degree 11/11/2017  . Metastatic breast cancer to small intestine causing SBO s/p SB resection 11/08/2017 11/10/2017  .  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 Cachexia ([place]) 11/10/2017  . New onset left bundle branch block (LBBB) 11/09/2017  . Colostomy obstruction from metastasis s/p colectomy/ostomy revision 11/08/2017 11/08/2017  . 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 Ascites 11/08/2017  . Hypoglycemia 11/04/2017  . Hypokalemia 11/04/2017  . Hypocalcemia 11/04/2017  . Small bowel obstruction from metastasis s/p SB resection 11/08/2017 11/03/2017  . Obstructive nephropathy 10/24/2017  . 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 Hyponatremia 10/16/2017  . Protein-calorie malnutrition, severe 07/11/2017  . Breast cancer metastasized to multiple sites ([place]) 10/16/2015  . Genetic testing 06/24/2015  . Osteoporosis 12/06/2013  . Cancer of lower-inner quadrant of left female breast ([place]) 03/22/2013  . Breast cancer metastasized to bone ([place]) 10/03/2012   Donato Heinz. Owens Shark PT  Norwood Levo 02/06/2018, 4:36 PM  [place], [place], [address] Phone: [phone]   Fax:  [phone]  Name: Sonia Hill MRN: [id number] Date of Birth: 10-Oct-1955

## 2018-02-06 NOTE — Patient Instructions (Addendum)
Cancer Rehab [phone] HIP: Flexion Standing    Holding onto counter lift leg straight in front keeping knee straight. Slow and controlled! _10__ reps per set, _2-3__ sets per day. Then repeat with other leg.   Hip Extension (Standing)    Stand with support at counter. Squeeze pelvic floor and hold so as not to twist hips and don't lean forward.  Move right leg backward with straight knee. Slow and controlled! Repeat _10__ times. Do _2-3__ times a day. Repeat with other leg.  Hip Abduction (Standing)    Stand with support. Squeeze pelvic floor and hold. Lift right leg out to side, keeping toe forward.  Repeat _10__ times. Do _2-3__ times a day. Repeat with other leg.   Over Head Pull: Narrow and Wide Grip   Cancer Rehab [phone]   On back, knees bent, feet flat, band across thighs, elbows straight but relaxed. Pull hands apart (start). Keeping elbows straight, bring arms up and over head, hands toward floor. Keep pull steady on band. Hold momentarily. Return slowly, keeping pull steady, back to start. Then do same with a wider grip on the band (past shoulder width) Repeat _5-10__ times. Band color __yellow____   Side Pull: Double Arm   On back, knees bent, feet flat. Arms perpendicular to body, shoulder level, elbows straight but relaxed. Pull arms out to sides, elbows straight. Resistance band comes across collarbones, hands toward floor. Hold momentarily. Slowly return to starting position. Repeat _5-10__ times. Band color _yellow____   Sword   On back, knees bent, feet flat, left hand on left hip, right hand above left. Pull right arm DIAGONALLY (hip to shoulder) across chest. Bring right arm along head toward floor. Hold momentarily. Slowly return to starting position. Repeat _5-10__ times. Do with left arm. Band color _yellow_____   Shoulder Rotation: Double Arm   On back, knees bent, feet flat, elbows tucked at sides, bent 90, hands palms up. Pull hands apart  and down toward floor, keeping elbows near sides. Hold momentarily. Slowly return to starting position. Repeat _5-10__ times. Band color __yellow____

## 2018-02-08 ENCOUNTER — Other Ambulatory Visit: Payer: Self-pay | Admitting: Oncology

## 2018-02-08 ENCOUNTER — Ambulatory Visit: Payer: 59 | Admitting: Physical Therapy

## 2018-02-08 ENCOUNTER — Encounter: Payer: Self-pay | Admitting: Physical Therapy

## 2018-02-08 ENCOUNTER — Ambulatory Visit (HOSPITAL_COMMUNITY)
Admission: RE | Admit: 2018-02-08 | Discharge: 2018-02-08 | Disposition: A | Discharge status: Home / Self Care | Payer: 59 | Source: Ambulatory Visit | Attending: Oncology | Admitting: Oncology

## 2018-02-08 DIAGNOSIS — C7951 Secondary malignant neoplasm of bone: Secondary | ICD-10-CM

## 2018-02-08 DIAGNOSIS — R262 Difficulty in walking, not elsewhere classified: Secondary | ICD-10-CM

## 2018-02-08 DIAGNOSIS — I890 Lymphedema, not elsewhere classified: Secondary | ICD-10-CM

## 2018-02-08 DIAGNOSIS — C50919 Malignant neoplasm of unspecified site of unspecified female breast: Secondary | ICD-10-CM

## 2018-02-08 DIAGNOSIS — Y733 Surgical instruments, materials and gastroenterology and urology devices (including sutures) associated with adverse incidents: Secondary | ICD-10-CM | POA: Diagnosis not present

## 2018-02-08 DIAGNOSIS — M6281 Muscle weakness (generalized): Secondary | ICD-10-CM

## 2018-02-08 DIAGNOSIS — T83038A Leakage of other indwelling urethral catheter, initial encounter: Secondary | ICD-10-CM | POA: Diagnosis present

## 2018-02-08 DIAGNOSIS — N138 Other obstructive and reflux uropathy: Secondary | ICD-10-CM

## 2018-02-08 HISTORY — PX: IR NEPHROSTOGRAM RIGHT THRU EXISTING ACCESS: IMG6062

## 2018-02-08 MED ORDER — IOPAMIDOL (ISOVUE-300) INJECTION 61%
INTRAVENOUS | Status: AC
  Filled 2018-02-08: qty 50

## 2018-02-08 MED ORDER — IOPAMIDOL (ISOVUE-300) INJECTION 61%
50.0000 mL | Freq: Once | INTRAVENOUS | Status: AC | PRN
  Administered 2018-02-08: 10 mL

## 2018-02-08 NOTE — Telephone Encounter (Signed)
No entry 

## 2018-02-08 NOTE — Patient Instructions (Signed)

## 2018-02-08 NOTE — Therapy (Signed)
[place], [place], [address] Phone: [phone]   Fax:  [phone]  Physical Therapy Treatment  Patient Details  Name: Sonia Hill MRN: [id number] Date of Birth: [birth year] Referring Provider: Dr. Vinie Sill    Encounter Date: 02/08/2018    Past Medical History:  Diagnosis Date  . Arthritis    neck, upper back, shoulders - no meds - yoga  . Chronic kidney disease    Only has right kidney  . Depression    Hx - no current problem  . GERD (gastroesophageal reflux disease)    diet controlled - no meds  . Hyperlipidemia    diet controlled  . Hypothyroidism   . lt breast ca dx'd 11/2009  . Osteoporosis, unspecified 12/06/2013  . PONV (postoperative nausea and vomiting)   . SBO (small bowel obstruction) ([place])   . SVD (spontaneous vaginal delivery)    x 1    Past Surgical History:  Procedure Laterality Date  . ABDOMINAL SURGERY    . BLADDER NECK RECONSTRUCTION     made a new urethea tube  . bladder tact  2008  . BOWEL RESECTION N/A 11/08/2017   Procedure: SMALL BOWEL RESECTION;  Surgeon: Michael Boston, MD;  [facility];  Service: General;  Laterality: N/A;  . BREAST SURGERY     masectomy left breast  . coloscopy    . COLOSTOMY    . DILATION AND CURETTAGE OF UTERUS    . GASTROSTOMY N/A 11/08/2017   Procedure: INSERTION OF GASTROSTOMY TUBE;  Surgeon: Michael Boston, MD;  [facility];  Service: General;  Laterality: N/A;  . IR NEPHROSTOMY PLACEMENT RIGHT  01/14/2018  . LAPAROSCOPY N/A 01/24/2013   Procedure: LAPROSCOPY OPERATIVE;  Surgeon: Margarette Asal, MD;  Location: [facility];  Service: Gynecology;  Laterality: N/A;  . LAPAROSCOPY N/A 11/08/2017   Procedure: LAPAROSCOPY DIAGNOSTIC;  Surgeon: Michael Boston, MD;  [facility];  Service: General;  Laterality: N/A;  . LYSIS OF ADHESION N/A 11/08/2017   Procedure: LYSIS OF ADHESION;  Surgeon: Michael Boston, MD;  [facility];  Service:  General;  Laterality: N/A;  . NEPHRECTOMY Left    left kidney dysfunction, recurrent pyelonephritis  . PARTIAL COLECTOMY N/A 11/08/2017   Procedure: PARTIAL COLECTOMY WITH NEW COLOSTOMY;  Surgeon: Michael Boston, MD;  [facility];  Service: General;  Laterality: N/A;  . SALPINGOOPHORECTOMY Bilateral 01/24/2013   Procedure: SALPINGO OOPHORECTOMY;  Surgeon: Margarette Asal, MD;  Location: [facility];  Service: Gynecology;  Laterality: Bilateral;  . WISDOM TOOTH EXTRACTION      There were no vitals filed for this visit.  Subjective Assessment - 02/08/18 1312    Subjective  Pt says she had some soreness from the exercises mostly in her legs, but that is all gone now     Pertinent History  BRCA 1-2 negative with lobular breast cancer stage IV at presentation July 2011 status post left mastectomy and sentinel lymph node sampling in July 2011 for a lower inner quadrant T1 N1 M1, stage IV invasive lobular breast cancer, grade 1, strongly estrogen and progesterone receptor-positive, HER2 negative with MIB-1 of 9% and no HER2 amplification, with multiple sclerotic bone lesions at presentation seen only on CT scan (not on bone scan or PET scan), but  with biopsy-proven metastatic disease to bone; and an elevated CA 27.29 at presentation, single functioning kidney, status post bilateral salpingo-oophorectomy 01/24/2013, with benign pathology osteoporosis; the patient refuses bisphosphonate therapy; started [facility] February  2014, T score -2.7 and -3., s/p colostomy, recent abdominal cellulits    Patient Stated Goals  to have more endurance     Currently in Pain?  Yes    Pain Score  4     Pain Location  Pelvis    Pain Orientation  Lower                  [facility]  Lymphedema Life Impact Scale Total Score  60.29 %           [facility] Adult PT Treatment/Exercise - 02/08/18 0001      Lumbar Exercises: Aerobic    Elliptical  3 minutes at level one       Lumbar Exercises: Supine   Pelvic Tilt  5 reps    Pelvic Tilt Limitations  also did 5 reps with pelvic floor activation     Clam  5 reps    Clam Limitations  some tightness with the left hip     Bent Knee Raise  5 reps    Other Supine Lumbar Exercises  ball squeeze for adduction       Knee/Hip Exercises: Stretches   Active Hamstring Stretch  Both;2 reps      Knee/Hip Exercises: Supine   Short Arc Quad Sets  Strengthening;Right;3 sets;10 reps   "popping" non painful at left medial knee    Short Arc Quad Sets Limitations  3# weight 2 sets of 10  unable to do on her left leg due to a painful "catch" with knee flexion       Shoulder Exercises: Supine   Other Supine Exercises  supine scapular series x 10 with yellow  band                   PT Long Term Goals - 01/30/18 1250      PT LONG TERM GOAL #1   Title  Pt will receive appropriate compression garments for management of bilateral LE, genital and abdominal swelling to decrease risk of infection    Status  Achieved      PT LONG TERM GOAL #2   Title  Pt will be independent in self MLD for long term management of lymphedema    Status  Deferred      PT LONG TERM GOAL #3   Title  Pt will report a 25% improvmement in bilateral LE, swelling to allow improved comfort    Status  Deferred      PT LONG TERM GOAL #4   Title  Pt will report a 25% improvmement in bilateral LE, swelling to allow improved comfort    Status  Deferred      PT LONG TERM GOAL #5   Title  Pt will reports she has about 50% more endurance allowing her to do more activities at home.     Time  4    Period  Weeks    Status  New      Additional Long Term Goals   Additional Long Term Goals  Yes      PT LONG TERM GOAL #6   Title  Pt will impove # of sit to stand to 14 in 30 seconds indicating and improvment in funcitonal strength     Baseline  12    Time  4    Period  Weeks    Status  New      PT LONG TERM  GOAL #7  Title  Pt will decrease TUG time to < 6 seconds indicating an improvment in functional mobility and balance     Baseline  6.7 sec    Time  4    Period  Weeks    Status  New      PT LONG TERM GOAL #8   Title  Pt will be independent in a home exercise program for strength and endurance     Time  4    Period  Weeks    Status  New            Plan - 02/08/18 1335    Clinical Impression Statement  Pt has some edema in left lower leg and thigh today and she says that she noticed that her left leg was more purplish in color than the right leg.  It is not warm or tender to palpation. She is trying to combat fatigue with activity.  She did well with exercise today and wants to work more on the elliptical to get her endurance up and do more to strengthen her arms.     Clinical Impairments Affecting Rehab Potential  1 kidney functioning, metastatic disease, osteoporosis, genital, abdominal and bilateral LE swelling    PT Treatment/Interventions  ADLs/Self Care Home Management;Therapeutic activities;Therapeutic exercise;Patient/family education;Manual lymph drainage;Manual techniques;Compression bandaging;Passive range of motion;Vasopneumatic Device;Taping;Orthotic Fit/Training    PT Next Visit Plan  keep increasing time on elliptical progress to standing [place] series for arm strength.  consider establishing a circut for strength training ? strength ABC program??    PT Home Exercise Plan  transverse abdominal series, supine scapular series     Consulted and Agree with Plan of Care  Patient       Patient will benefit from skilled therapeutic intervention in order to improve the following deficits and impairments:  Increased edema, Difficulty walking, Decreased knowledge of precautions, Decreased endurance, Decreased strength, Pain, Decreased activity tolerance, Impaired perceived functional ability, Decreased mobility  Visit Diagnosis: Muscle weakness (generalized)  Difficulty in  walking, not elsewhere classified  Lymphedema, not elsewhere classified     Problem List Patient Active Problem List   Diagnosis Date Noted  . CKD (chronic kidney disease), stage III ([place]) 01/15/2018  . Acute lower UTI 01/14/2018  . Pelvic mass 01/14/2018  . Leukopenia due to antineoplastic chemotherapy ([place]) 01/14/2018  . SIRS (systemic inflammatory response syndrome) ([place]) 01/14/2018  . Acute kidney injury superimposed on CKD ([place]) 01/13/2018  . ARF (acute renal failure) ([place]) 01/13/2018  . Acute kidney injury ([place])   . Gross hematuria   . Anemia 11/12/2017  . Malnutrition of moderate degree 11/11/2017  . Metastatic breast cancer to small intestine causing SBO s/p SB resection 11/08/2017 11/10/2017  . 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 Cachexia ([place]) 11/10/2017  . New onset left bundle branch block (LBBB) 11/09/2017  . Colostomy obstruction from metastasis s/p colectomy/ostomy revision 11/08/2017 11/08/2017  . 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 Ascites 11/08/2017  . Hypoglycemia 11/04/2017  . Hypokalemia 11/04/2017  . Hypocalcemia 11/04/2017  . Small bowel obstruction from metastasis s/p SB resection 11/08/2017 11/03/2017  . Obstructive nephropathy 10/24/2017  . 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 Hyponatremia 10/16/2017  . Protein-calorie malnutrition, severe 07/11/2017  . Breast cancer metastasized to multiple sites ([place]) 10/16/2015  . Genetic testing 06/24/2015  . Osteoporosis 12/06/2013  . Cancer of lower-inner quadrant of left female breast ([place]) 03/22/2013  . Breast  cancer metastasized to bone ([place]) 10/03/2012   Donato Heinz. Owens Shark PT  Norwood Levo 02/08/2018, 4:24 PM  [place], [place], [address] Phone: [phone]   Fax:  [phone]  Name: CARLIYAH COTTERMAN MRN:  [id number] Date of Birth: [birth year]

## 2018-02-13 ENCOUNTER — Inpatient Hospital Stay: Payer: 59

## 2018-02-13 ENCOUNTER — Other Ambulatory Visit: Payer: Self-pay | Admitting: Emergency Medicine

## 2018-02-13 ENCOUNTER — Inpatient Hospital Stay: Payer: 59 | Attending: Oncology | Admitting: Medical

## 2018-02-13 ENCOUNTER — Encounter: Payer: Self-pay | Admitting: Physical Therapy

## 2018-02-13 ENCOUNTER — Other Ambulatory Visit: Payer: Self-pay | Admitting: *Deleted

## 2018-02-13 ENCOUNTER — Telehealth: Payer: Self-pay | Admitting: *Deleted

## 2018-02-13 ENCOUNTER — Ambulatory Visit: Payer: 59 | Attending: Oncology | Admitting: Physical Therapy

## 2018-02-13 ENCOUNTER — Ambulatory Visit (HOSPITAL_COMMUNITY)
Admission: RE | Admit: 2018-02-13 | Discharge: 2018-02-13 | Disposition: A | Discharge status: Home / Self Care | Payer: 59 | Source: Ambulatory Visit | Attending: Oncology | Admitting: Oncology

## 2018-02-13 VITALS — BP 140/75 | HR 85 | Temp 98.7°F | Resp 18 | Ht 61.0 in | Wt 101.4 lb

## 2018-02-13 DIAGNOSIS — C7951 Secondary malignant neoplasm of bone: Principal | ICD-10-CM

## 2018-02-13 DIAGNOSIS — C50312 Malignant neoplasm of lower-inner quadrant of left female breast: Secondary | ICD-10-CM

## 2018-02-13 DIAGNOSIS — Z9221 Personal history of antineoplastic chemotherapy: Secondary | ICD-10-CM | POA: Diagnosis not present

## 2018-02-13 DIAGNOSIS — M810 Age-related osteoporosis without current pathological fracture: Secondary | ICD-10-CM | POA: Insufficient documentation

## 2018-02-13 DIAGNOSIS — Z170 Estrogen receptor positive status [ER+]: Secondary | ICD-10-CM | POA: Diagnosis not present

## 2018-02-13 DIAGNOSIS — Z905 Acquired absence of kidney: Secondary | ICD-10-CM | POA: Diagnosis not present

## 2018-02-13 DIAGNOSIS — C50919 Malignant neoplasm of unspecified site of unspecified female breast: Secondary | ICD-10-CM

## 2018-02-13 DIAGNOSIS — M7989 Other specified soft tissue disorders: Secondary | ICD-10-CM | POA: Insufficient documentation

## 2018-02-13 DIAGNOSIS — R600 Localized edema: Secondary | ICD-10-CM | POA: Diagnosis present

## 2018-02-13 DIAGNOSIS — C50911 Malignant neoplasm of unspecified site of right female breast: Secondary | ICD-10-CM | POA: Insufficient documentation

## 2018-02-13 DIAGNOSIS — R188 Other ascites: Secondary | ICD-10-CM

## 2018-02-13 DIAGNOSIS — Z933 Colostomy status: Secondary | ICD-10-CM | POA: Diagnosis not present

## 2018-02-13 DIAGNOSIS — Z9012 Acquired absence of left breast and nipple: Secondary | ICD-10-CM | POA: Insufficient documentation

## 2018-02-13 DIAGNOSIS — C50912 Malignant neoplasm of unspecified site of left female breast: Secondary | ICD-10-CM

## 2018-02-13 DIAGNOSIS — M79606 Pain in leg, unspecified: Secondary | ICD-10-CM | POA: Insufficient documentation

## 2018-02-13 DIAGNOSIS — I890 Lymphedema, not elsewhere classified: Secondary | ICD-10-CM | POA: Insufficient documentation

## 2018-02-13 DIAGNOSIS — R531 Weakness: Secondary | ICD-10-CM

## 2018-02-13 DIAGNOSIS — R262 Difficulty in walking, not elsewhere classified: Secondary | ICD-10-CM | POA: Diagnosis present

## 2018-02-13 DIAGNOSIS — M6281 Muscle weakness (generalized): Secondary | ICD-10-CM | POA: Diagnosis not present

## 2018-02-13 LAB — CMP (CANCER CENTER ONLY)
ALBUMIN: 3.6 g/dL (ref 3.5–5.0)
ALK PHOS: 75 U/L (ref 38–126)
ALT: 7 U/L (ref 0–44)
ANION GAP: 10 (ref 5–15)
AST: 41 U/L (ref 15–41)
BUN: 23 mg/dL (ref 8–23)
CO2: 27 mmol/L (ref 22–32)
Calcium: 10.7 mg/dL — ABNORMAL HIGH (ref 8.9–10.3)
Chloride: 97 mmol/L — ABNORMAL LOW (ref 98–111)
Creatinine: 1.46 mg/dL — ABNORMAL HIGH (ref 0.44–1.00)
GFR, Est AFR Am: 43 mL/min — ABNORMAL LOW (ref >60)
GFR, Estimated: 37 mL/min — ABNORMAL LOW (ref >60)
GLUCOSE: 89 mg/dL (ref 70–99)
Potassium: 4.9 mmol/L (ref 3.5–5.1)
SODIUM: 134 mmol/L — AB (ref 135–145)
Total Bilirubin: 0.4 mg/dL (ref 0.3–1.2)
Total Protein: 7.7 g/dL (ref 6.5–8.1)

## 2018-02-13 LAB — CBC WITH DIFFERENTIAL (CANCER CENTER ONLY)
Basophils Absolute: 0 10*3/uL (ref 0.0–0.1)
Basophils Relative: 0 %
EOS PCT: 2 %
Eosinophils Absolute: 0.1 10*3/uL (ref 0.0–0.5)
HEMATOCRIT: 33.2 % — AB (ref 34.8–46.6)
Hemoglobin: 11 g/dL — ABNORMAL LOW (ref 11.6–15.9)
LYMPHS ABS: 0.8 10*3/uL — AB (ref 0.9–3.3)
LYMPHS PCT: 16 %
MCH: 31.5 pg (ref 25.1–34.0)
MCHC: 33.1 g/dL (ref 31.5–36.0)
MCV: 95.1 fL (ref 79.5–101.0)
MONO ABS: 0.3 10*3/uL (ref 0.1–0.9)
Monocytes Relative: 5 %
Neutro Abs: 4 10*3/uL (ref 1.5–6.5)
Neutrophils Relative %: 77 %
PLATELETS: 288 10*3/uL (ref 145–400)
RBC: 3.49 MIL/uL — ABNORMAL LOW (ref 3.70–5.45)
RDW: 14.3 % (ref 11.2–14.5)
WBC Count: 5.1 10*3/uL (ref 3.9–10.3)

## 2018-02-13 LAB — BRAIN NATRIURETIC PEPTIDE: B Natriuretic Peptide: 24.3 pg/mL (ref 0.0–100.0)

## 2018-02-13 NOTE — Patient Instructions (Signed)
PICC Home Guide °A peripherally inserted central catheter (PICC) is a long, thin, flexible tube that is inserted into a vein in the upper arm. It is a form of intravenous (IV) access. It is considered to be a "central" line because the tip of the PICC ends in a large vein in your chest. This large vein is called the superior vena cava (SVC). The PICC tip ends in the SVC because there is a lot of blood flow in the SVC. This allows medicines and IV fluids to be quickly distributed throughout the body. The PICC is inserted using a sterile technique by a specially trained nurse or physician. After the PICC is inserted, a chest X-ray exam is done to be sure it is in the correct place. °A PICC may be placed for different reasons, such as: °· To give medicines and liquid nutrition that can only be given through a central line. Examples are: °? Certain antibiotic treatments. °? Chemotherapy. °? Total parenteral nutrition (TPN). °· To take frequent blood samples. °· To give IV fluids and blood products. °· If there is difficulty placing a peripheral intravenous (PIV) catheter. ° °If taken care of properly, a PICC can remain in place for several months. A PICC can also allow a person to go home from the hospital early. Medicine and PICC care can be managed at home by a family member or home health care team. °What problems can happen when I have a PICC? °Problems with a PICC can occasionally occur. These may include the following: °· A blood clot (thrombus) forming in or at the tip of the PICC. This can cause the PICC to become clogged. A clot-dissolving medicine called tissue plasminogen activator (tPA) can be given through the PICC to help break up the clot. °· Inflammation of the vein (phlebitis) in which the PICC is placed. Signs of inflammation may include redness, pain at the insertion site, red streaks, or being able to feel a "cord" in the vein where the PICC is located. °· Infection in the PICC or at the insertion  site. Signs of infection may include fever, chills, redness, swelling, or pus drainage from the PICC insertion site. °· PICC movement (malposition). The PICC tip may move from its original position due to excessive physical activity, forceful coughing, sneezing, or vomiting. °· A break or cut in the PICC. It is important to not use scissors near the PICC. °· Nerve or tendon irritation or injury during PICC insertion. ° °What should I keep in mind about activities when I have a PICC? °· You may bend your arm and move it freely. If your PICC is near or at the bend of your elbow, avoid activity with repeated motion at the elbow. °· Rest at home for the remainder of the day following PICC line insertion. °· Avoid lifting heavy objects as instructed by your health care provider. °· Avoid using a crutch with the arm on the same side as your PICC. You may need to use a walker. °What should I know about my PICC dressing? °· Keep your PICC bandage (dressing) clean and dry to prevent infection. °? Ask your health care provider when you may shower. Ask your health care provider to teach you how to wrap the PICC when you do take a shower. °· Change the PICC dressing as instructed by your health care provider. °· Change your PICC dressing if it becomes loose or wet. °What should I know about PICC care? °· Check the PICC insertion   site daily for leakage, redness, swelling, or pain. °· Do not take a bath, swim, or use hot tubs when you have a PICC. Cover PICC line with clear plastic wrap and tape to keep it dry while showering. °· Flush the PICC as directed by your health care provider. Let your health care provider know right away if the PICC is difficult to flush or does not flush. Do not use force to flush the PICC. °· Do not use a syringe that is less than 10 mL to flush the PICC. °· Never pull or tug on the PICC. °· Avoid blood pressure checks on the arm with the PICC. °· Keep your PICC identification card with you at all  times. °· Do not take the PICC out yourself. Only a trained clinical professional should remove the PICC. °Get help right away if: °· Your PICC is accidentally pulled all the way out. If this happens, cover the insertion site with a bandage or gauze dressing. Do not throw the PICC away. Your health care provider will need to inspect it. °· Your PICC was tugged or pulled and has partially come out. Do not  push the PICC back in. °· There is any type of drainage, redness, or swelling where the PICC enters the skin. °· You cannot flush the PICC, it is difficult to flush, or the PICC leaks around the insertion site when it is flushed. °· You hear a "flushing" sound when the PICC is flushed. °· You have pain, discomfort, or numbness in your arm, shoulder, or jaw on the same side as the PICC. °· You feel your heart "racing" or skipping beats. °· You notice a hole or tear in the PICC. °· You develop chills or a fever. °This information is not intended to replace advice given to you by your health care provider. Make sure you discuss any questions you have with your health care provider. °Document Released: 12/04/2002 Document Revised: 12/18/2015 Document Reviewed: 03/22/2013 °Elsevier Interactive Patient Education © 2017 [employer] ° °

## 2018-02-13 NOTE — Progress Notes (Signed)
*  Preliminary Results* Left lower extremity venous duplex completed. Left lower extremity is negative for deep vein thrombosis. There is no evidence of left Baker's cyst.  02/13/2018 3:27 PM  Jinny Blossom Dawna Part

## 2018-02-13 NOTE — Progress Notes (Signed)
Pt seen by PA Van only, no RN assessment at this time.  PA aware. 

## 2018-02-13 NOTE — Telephone Encounter (Signed)
This RN contacted pt per communication from lymphedema therapist regarding noted increased left leg swelling.  Per Almyra Free she states leg has pitting edema which she noted on Saturday.  Leg is heavy and tight feeling but no focal area of pain.  She states she is having decreased output from her nephrostomy tube ( which was assessed by IR on 8/29 showing good patency of tube.  Per MD review and concern- requested for pt to come in for doppler for evaluation and to see Lucianne Lei in [facility].

## 2018-02-13 NOTE — Progress Notes (Signed)
[facility] Progress Note   Sonia Hill [id number] 03-09-1956 62 y.o.  Sonia Hill is managed by Dr. [name]  Actively treated with chemotherapy/immunotherapy: yes  Current Therapy: abemaciclib and exemestane   Assessment: Plan:    Localized edema - Plan: BNP (Brain natriuretic peptide), CT Abdomen Pelvis Wo Contrast  Carcinoma of breast metastatic to bone, unspecified laterality ([place]) - Plan: CT Abdomen Pelvis Wo Contrast  Weakness - Plan: CANCELED: Draw extra clot specimen   Localized edema of left lower extremity: A CBC, CMP, BNP and CT scan of the abdomen and pelvis without contrast were ordered today.  Sonia Hill has an appointment for follow-up on 02/15/2018. Sonia Hill was referred for a Doppler ultrasound of her left lower extremity which returned negative for a DVT and showed no evidence of a Baker's cyst.  Metastatic carcinoma of the breast: Sonia Hill continues to be followed by Dr. [name] and is currently treated with abemaciclib and exemestane.  A CT scan of the abdomen and pelvis were ordered today and have been requested for tomorrow.  She has a follow-up appointment on 02/15/2018.  This case was discussed with Dr. [name] today.  Please see After Visit Summary for patient specific instructions.  Future Appointments  Date Time Provider [place]  02/15/2018 10:15 AM Collie Siad A, PTA [facility] None  02/15/2018  1:00 PM [facility] None  02/15/2018  1:30 PM Causey, Charlestine Massed, NP [facility] None  02/20/2018 11:00 AM Kipp Laurence, PT [facility] None  02/22/2018 11:00 AM Kipp Laurence, PT [facility] None  02/27/2018 11:00 AM Kipp Laurence, PT [facility] None  03/01/2018 11:00 AM Kipp Laurence, PT [facility] None  03/09/2018  1:30 PM [facility] [place]    Orders Placed This Encounter  Procedures  . CT Abdomen Pelvis Wo Contrast  . BNP (Brain natriuretic peptide)       Subjective:   Patient ID:   Sonia Hill is a 62 y.o. (DOB June 09, 1956) female.  Chief Complaint: No chief complaint on file.   HPI Sonia Hill is a 62 year old female who is managed by Dr. [name] and is currently receiving abemaciclib and exemestane for her metastatic breast cancer.  She is seen in clinic today for acute left lower extremity edema.  She was referred for a Doppler ultrasound of her left lower extremity which returned negative for a DVT and showed no evidence of a Baker's cyst.  She is recently had a nephrostomy tube placed in her right flank for acute kidney injury secondary to metastatic disease.  She reports having a decrease in output to her right nephrostomy over the past 24 hours.  She reports pain at that site.  She also reports pain in her right lower abdomen.  She reports chills but no fevers or sweats.  She is having no nausea or vomiting and has had no change to her colostomy output.  She denies shortness of breath or chest discomfort.  Medications: I have reviewed the patient's current medications.  Allergies:  Allergies  Allergen Reactions  . Sulfa Antibiotics Rash  . Adhesive [Tape] Rash    Ok to use paper tape and tegaderm over IV site  . Betadine [Povidone Iodine] Rash  . Mercury Rash    Reaction mercurachrome    Past Medical History:  Diagnosis Date  . Arthritis    neck, upper back, shoulders - no meds - yoga  . Chronic kidney disease    Only  has right kidney  . Depression    Hx - no current problem  . GERD (gastroesophageal reflux disease)    diet controlled - no meds  . Hyperlipidemia    diet controlled  . Hypothyroidism   . lt breast ca dx'd 11/2009  . Osteoporosis, unspecified 12/06/2013  . PONV (postoperative nausea and vomiting)   . SBO (small bowel obstruction) ([place])   . SVD (spontaneous vaginal delivery)    x 1    Past Surgical History:  Procedure Laterality Date  . ABDOMINAL SURGERY    . BLADDER NECK RECONSTRUCTION     made a new urethea tube  . bladder  tact  2008  . BOWEL RESECTION N/A 11/08/2017   Procedure: SMALL BOWEL RESECTION;  Surgeon: Michael Boston, MD;  Location[facility];  Service: General;  Laterality: N/A;  . BREAST SURGERY     masectomy left breast  . coloscopy    . COLOSTOMY    . DILATION AND CURETTAGE OF UTERUS    . GASTROSTOMY N/A 11/08/2017   Procedure: INSERTION OF GASTROSTOMY TUBE;  Surgeon: Michael Boston, MD;  Location[facility];  Service: General;  Laterality: N/A;  . IR NEPHROSTOGRAM RIGHT THRU EXISTING ACCESS  02/08/2018  . IR NEPHROSTOMY PLACEMENT RIGHT  01/14/2018  . LAPAROSCOPY N/A 01/24/2013   Procedure: LAPROSCOPY OPERATIVE;  Surgeon: Margarette Asal, MD;  Location: [facility];  Service: Gynecology;  Laterality: N/A;  . LAPAROSCOPY N/A 11/08/2017   Procedure: LAPAROSCOPY DIAGNOSTIC;  Surgeon: Michael Boston, MD;  [facility];  Service: General;  Laterality: N/A;  . LYSIS OF ADHESION N/A 11/08/2017   Procedure: LYSIS OF ADHESION;  Surgeon: Michael Boston, MD;  [facility];  Service: General;  Laterality: N/A;  . NEPHRECTOMY Left    left kidney dysfunction, recurrent pyelonephritis  . PARTIAL COLECTOMY N/A 11/08/2017   Procedure: PARTIAL COLECTOMY WITH NEW COLOSTOMY;  Surgeon: Michael Boston, MD;  Location[facility];  Service: General;  Laterality: N/A;  . SALPINGOOPHORECTOMY Bilateral 01/24/2013   Procedure: SALPINGO OOPHORECTOMY;  Surgeon: Margarette Asal, MD;  Location: [facility];  Service: Gynecology;  Laterality: Bilateral;  . WISDOM TOOTH EXTRACTION      Family History  Problem Relation Age of Onset  . Breast cancer Mother 50  . Breast cancer Maternal Aunt 58  . Breast cancer Other        MGF's sister dx with breast cancer in her 79s    Social History   Socioeconomic History  . Marital status: Married    Spouse name: Not on file  . Number of children: Not on file  . Years of education: Not on file  . Highest education level: Not on file  Occupational History  . Not on file  Social Needs  . Financial  resource strain: Not on file  . Food insecurity:    Worry: Not on file    Inability: Not on file  . Transportation needs:    Medical: Not on file    Non-medical: Not on file  Tobacco Use  . Smoking status: Never Smoker  . Smokeless tobacco: Never Used  Substance and Sexual Activity  . Alcohol use: No  . Drug use: No  . Sexual activity: Yes    Birth control/protection: Post-menopausal  Lifestyle  . Physical activity:    Days per week: Not on file    Minutes per session: Not on file  . Stress: Not on file  Relationships  . Social connections:    Talks on  phone: Not on file    Gets together: Not on file    Attends religious service: Not on file    Active member of club or organization: Not on file    Attends meetings of clubs or organizations: Not on file    Relationship status: Not on file  . Intimate partner violence:    Fear of current or ex partner: Not on file    Emotionally abused: Not on file    Physically abused: Not on file    Forced sexual activity: Not on file  Other Topics Concern  . Not on file  Social History Narrative  . Not on file    Past Medical History, Surgical history, Social history, and Family history were reviewed and updated as appropriate.   Please see review of systems for further details on the patient's review from today.   Review of Systems:  Review of Systems  Constitutional: Positive for chills. Negative for diaphoresis and fever.  HENT: Negative for trouble swallowing and voice change.   Respiratory: Negative for cough, chest tightness, shortness of breath and wheezing.   Cardiovascular: Positive for leg swelling. Negative for chest pain and palpitations.  Gastrointestinal: Positive for abdominal pain (Right lower quadrant abdominal discomfort.). Negative for constipation, diarrhea, nausea and vomiting.  Genitourinary:       Decrease output to a right nephrostomy over the past 24 hours.  Musculoskeletal: Negative for back pain and  myalgias.  Neurological: Negative for dizziness, light-headedness and headaches.    Objective:   Physical Exam:  BP 140/75 (BP Location: Left Arm, Patient Position: Sitting)   Pulse 85   Temp 98.7 F (37.1 C) (Oral)   Resp 18   Ht 5\' 1"  (1.549 m)   Wt 101 lb 6.4 oz (46 kg)   SpO2 100%   BMI 19.16 kg/m  ECOG: 1  Physical Exam  Constitutional: No distress.  HENT:  Head: Normocephalic and atraumatic.  Cardiovascular: Normal rate, regular rhythm and normal heart sounds. Exam reveals no gallop and no friction rub.  No murmur heard. Pulmonary/Chest: Effort normal and breath sounds normal. No respiratory distress. She has no wheezes. She has no rales.  Abdominal: She exhibits no distension and no mass. There is no tenderness. There is no rebound and no guarding.  Left lower quadrant ostomy is noted. Bilateral inguinal tenderness.  Musculoskeletal: She exhibits edema (1+ left lower extremity edema over the anterior tibia.  Trace pitting edema over the left proximal thigh.  Trace to 1+ presacral pitting edema.).  A right nephrostomy is noted.  Left lower extremity measures 37 cm at 10 cm distal to the inferior pole of the patella. Right lower extremity measures 32 cm at 10 cm distal to the inferior pole of the patella.  Neurological: She is alert.  Skin: Skin is warm and dry. No rash noted. She is not diaphoretic. No erythema.  Psychiatric: She has a normal mood and affect. Her behavior is normal. Judgment and thought content normal.    Lab Review:     Component Value Date/Time   NA 134 (L) 02/13/2018 1541   NA 134 (L) 12/28/2016 1503   K 4.9 02/13/2018 1541   K 4.0 12/28/2016 1503   CL 97 (L) 02/13/2018 1541   CL 98 12/03/2012 1419   CO2 27 02/13/2018 1541   CO2 25 12/28/2016 1503   GLUCOSE 89 02/13/2018 1541   GLUCOSE 96 12/28/2016 1503   GLUCOSE 89 12/03/2012 1419   BUN 23 02/13/2018 1541  BUN 12.0 12/28/2016 1503   CREATININE 1.46 (H) 02/13/2018 1541   CREATININE  0.8 12/28/2016 1503   CALCIUM 10.7 (H) 02/13/2018 1541   CALCIUM 9.7 12/28/2016 1503   PROT 7.7 02/13/2018 1541   PROT 6.5 12/28/2016 1503   ALBUMIN 3.6 02/13/2018 1541   ALBUMIN 3.9 12/28/2016 1503   AST 41 02/13/2018 1541   AST 22 12/28/2016 1503   ALT 7 02/13/2018 1541   ALT 20 12/28/2016 1503   ALKPHOS 75 02/13/2018 1541   ALKPHOS 76 12/28/2016 1503   BILITOT 0.4 02/13/2018 1541   BILITOT 0.50 12/28/2016 1503   GFRNONAA 37 (L) 02/13/2018 1541   GFRAA 43 (L) 02/13/2018 1541       Component Value Date/Time   WBC 5.1 02/13/2018 1541   WBC 4.0 01/22/2018 1222   RBC 3.49 (L) 02/13/2018 1541   HGB 11.0 (L) 02/13/2018 1541   HGB 12.4 05/16/2017 0910   HCT 33.2 (L) 02/13/2018 1541   HCT 36.9 05/16/2017 0910   PLT 288 02/13/2018 1541   PLT 286 05/16/2017 0910   MCV 95.1 02/13/2018 1541   MCV 96.7 05/16/2017 0910   MCH 31.5 02/13/2018 1541   MCHC 33.1 02/13/2018 1541   RDW 14.3 02/13/2018 1541   RDW 13.0 05/16/2017 0910   LYMPHSABS 0.8 (L) 02/13/2018 1541   LYMPHSABS 0.9 05/16/2017 0910   MONOABS 0.3 02/13/2018 1541   MONOABS 0.4 05/16/2017 0910   EOSABS 0.1 02/13/2018 1541   EOSABS 0.3 05/16/2017 0910   BASOSABS 0.0 02/13/2018 1541   BASOSABS 0.0 05/16/2017 0910   -------------------------------  Imaging from last 24 hours (if applicable):  Radiology interpretation: Ir Nephrostogram Right Thru Existing Access  Result Date: 02/08/2018 INDICATION: Leaking nephrostomy 2 EXAM: RIGHT NEPHROSTOGRAM COMPARISON:  None. MEDICATIONS: None ANESTHESIA/SEDATION: None CONTRAST:  5 cc Isovue-300-administered into the collecting system(s) FLUOROSCOPY TIME:  Fluoroscopy Time:  minutes 18 seconds (1.7 mGy). COMPLICATIONS: None immediate. PROCEDURE: Contrast was injected into the right nephrostomy tube and imaging was obtained. FINDINGS: The nephrostomy tube is coiled in the right renal pelvis. There is no extravasation of contrast. It is widely patent. IMPRESSION: Right nephrostomy  catheter is functioning adequately. Electronically Signed   By: Marybelle Killings M.D.   On: 02/08/2018 17:23        This case was discussed with Dr. [name]. He expressed his agreement with my management of this patient.

## 2018-02-13 NOTE — Therapy (Signed)
[place], [place], [address] Phone: [phone]   Fax:  [phone]  Physical Therapy Treatment  Patient Details  Name: Sonia Hill MRN: [id number] Date of Birth: Sep 06, 1955 Referring Provider: Dr. Vinie Sill    Encounter Date: 02/13/2018  PT End of Session - 02/13/18 1336    Visit Number  7    Number of Visits  16    Date for PT Re-Evaluation  03/02/18    PT Start Time  1300    PT Stop Time  1345    PT Time Calculation (min)  45 min    Activity Tolerance  Treatment limited secondary to medical complications (Comment)    Behavior During Therapy  [facility] for tasks assessed/performed       Past Medical History:  Diagnosis Date  . Arthritis    neck, upper back, shoulders - no meds - yoga  . Chronic kidney disease    Only has right kidney  . Depression    Hx - no current problem  . GERD (gastroesophageal reflux disease)    diet controlled - no meds  . Hyperlipidemia    diet controlled  . Hypothyroidism   . lt breast ca dx'd 11/2009  . Osteoporosis, unspecified 12/06/2013  . PONV (postoperative nausea and vomiting)   . SBO (small bowel obstruction) ([place])   . SVD (spontaneous vaginal delivery)    x 1    Past Surgical History:  Procedure Laterality Date  . ABDOMINAL SURGERY    . BLADDER NECK RECONSTRUCTION     made a new urethea tube  . bladder tact  2008  . BOWEL RESECTION N/A 11/08/2017   Procedure: SMALL BOWEL RESECTION;  Surgeon: Michael Boston, MD;  Location: [facility];  Service: General;  Laterality: N/A;  . BREAST SURGERY     masectomy left breast  . coloscopy    . COLOSTOMY    . DILATION AND CURETTAGE OF UTERUS    . GASTROSTOMY N/A 11/08/2017   Procedure: INSERTION OF GASTROSTOMY TUBE;  Surgeon: Michael Boston, MD;  Location: [facility];  Service: General;  Laterality: N/A;  . IR NEPHROSTOGRAM RIGHT THRU EXISTING ACCESS  02/08/2018  . IR NEPHROSTOMY PLACEMENT RIGHT  01/14/2018  . LAPAROSCOPY N/A  01/24/2013   Procedure: LAPROSCOPY OPERATIVE;  Surgeon: Margarette Asal, MD;  Location: [facility];  Service: Gynecology;  Laterality: N/A;  . LAPAROSCOPY N/A 11/08/2017   Procedure: LAPAROSCOPY DIAGNOSTIC;  Surgeon: Michael Boston, MD;  Location: [facility];  Service: General;  Laterality: N/A;  . LYSIS OF ADHESION N/A 11/08/2017   Procedure: LYSIS OF ADHESION;  Surgeon: Michael Boston, MD;  Location: [facility];  Service: General;  Laterality: N/A;  . NEPHRECTOMY Left    left kidney dysfunction, recurrent pyelonephritis  . PARTIAL COLECTOMY N/A 11/08/2017   Procedure: PARTIAL COLECTOMY WITH NEW COLOSTOMY;  Surgeon: Michael Boston, MD;  Location: [facility];  Service: General;  Laterality: N/A;  . SALPINGOOPHORECTOMY Bilateral 01/24/2013   Procedure: SALPINGO OOPHORECTOMY;  Surgeon: Margarette Asal, MD;  Location: [facility];  Service: Gynecology;  Laterality: Bilateral;  . WISDOM TOOTH EXTRACTION      There were no vitals filed for this visit.  Subjective Assessment - 02/13/18 1308    Subjective  Pt says she woke up on Saturday morning morning with her leg swollen.  It is about the same since Saturday.  She is also nauseated today. She has a call into her urologist today, but they have not called her  back. She had her nephostomy tube checked at the radiology dept on Thrusday and all is ok     Pertinent History  BRCA 1-2 negative with lobular breast cancer stage IV at presentation July 2011 status post left mastectomy and sentinel lymph node sampling in July 2011 for a lower inner quadrant T1 N1 M1, stage IV invasive lobular breast cancer, grade 1, strongly estrogen and progesterone receptor-positive, HER2 negative with MIB-1 of 9% and no HER2 amplification, with multiple sclerotic bone lesions at presentation seen only on CT scan (not on bone scan or PET scan), but  with biopsy-proven metastatic disease to bone; and an elevated CA 27.29 at presentation, single functioning kidney, status post bilateral  salpingo-oophorectomy 01/24/2013, with benign pathology osteoporosis; the patient refuses bisphosphonate therapy; started [facility] February 2014, T score -2.7 and -3., s/p colostomy, recent abdominal cellulits    Currently in Pain?  Yes    Pain Score  4    it about the same   Pain Location  Pelvis    Pain Orientation  Lower    Pain Descriptors / Indicators  Discomfort    Effect of Pain on Daily Activities  she has not been able to move around alot since the swelling has come back             LYMPHEDEMA/ONCOLOGY QUESTIONNAIRE - 02/13/18 1315      Left Lower Extremity Lymphedema   20 cm Proximal to Suprapatella  45 cm    10 cm Proximal to Suprapatella  42.5 cm    At Midpatella/Popliteal Crease  37 cm    30 cm Proximal to Floor at Lateral Plantar Foot  36 cm    20 cm Proximal to Floor at Lateral Plantar Foot  29.5 cm    10 cm Proximal to Floor at Lateral Malleoli  22.3 cm    5 cm Proximal to 1st MTP Joint  22 cm    Across MTP Joint  20 cm    Around Proximal Great Toe  7 cm           Outpatient Re[facility]03/2018 [facility]  Lymphedema Life Impact Scale Total Score  60.29 %                        PT Long Term Goals - 01/30/18 1250      PT LONG TERM GOAL #1   Title  Pt will receive appropriate compression garments for management of bilateral LE, genital and abdominal swelling to decrease risk of infection    Status  Achieved      PT LONG TERM GOAL #2   Title  Pt will be independent in self MLD for long term management of lymphedema    Status  Deferred      PT LONG TERM GOAL #3   Title  Pt will report a 25% improvmement in bilateral LE, swelling to allow improved comfort    Status  Deferred      PT LONG TERM GOAL #4   Title  Pt will report a 25% improvmement in bilateral LE, swelling to allow improved comfort    Status  Deferred      PT LONG TERM GOAL #5   Title  Pt will reports she has about 50% more  endurance allowing her to do more activities at home.     Time  4    Period  Weeks    Status  New  Additional Long Term Goals   Additional Long Term Goals  Yes      PT LONG TERM GOAL #6   Title  Pt will impove # of sit to stand to 14 in 30 seconds indicating and improvment in funcitonal strength     Baseline  12    Time  4    Period  Weeks    Status  New      PT LONG TERM GOAL #7   Title  Pt will decrease TUG time to < 6 seconds indicating an improvment in functional mobility and balance     Baseline  6.7 sec    Time  4    Period  Weeks    Status  New      PT LONG TERM GOAL #8   Title  Pt will be independent in a home exercise program for strength and endurance     Time  4    Period  Weeks    Status  New            Plan - 02/13/18 1337    Clinical Impression Statement  Pt has had return of swelling in her left leg with decrease in ourput in the nephostomy bag.  She has call into the urologist and will follow up with other doctors to see if there is a medical reason for her swelling.  [employer] message sent to Hinda Lenis     Clinical Impairments Affecting Rehab Potential  1 kidney functioning, metastatic disease, osteoporosis, genital, abdominal and bilateral LE swelling    PT Frequency  2x / week    PT Duration  3 weeks    PT Next Visit Plan  Reassess and progress with exercise or lymphedema treatment as indicated     Consulted and Agree with Plan of Care  Patient       Patient will benefit from skilled therapeutic intervention in order to improve the following deficits and impairments:  Increased edema, Difficulty walking, Decreased knowledge of precautions, Decreased endurance, Decreased strength, Pain, Decreased activity tolerance, Impaired perceived functional ability, Decreased mobility  Visit Diagnosis: Muscle weakness (generalized)  Difficulty in walking, not elsewhere classified  Lymphedema, not elsewhere classified     Problem List Patient Active  Problem List   Diagnosis Date Noted  . CKD (chronic kidney disease), stage III ([place]) 01/15/2018  . Acute lower UTI 01/14/2018  . Pelvic mass 01/14/2018  . Leukopenia due to antineoplastic chemotherapy ([place]) 01/14/2018  . SIRS (systemic inflammatory response syndrome) ([place]) 01/14/2018  . Acute kidney injury superimposed on CKD ([place]) 01/13/2018  . ARF (acute renal failure) ([place]) 01/13/2018  . Acute kidney injury ([place])   . Gross hematuria   . Anemia 11/12/2017  . Malnutrition of moderate degree 11/11/2017  . Metastatic breast cancer to small intestine causing SBO s/p SB resection 11/08/2017 11/10/2017  . 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 Cachexia ([place]) 11/10/2017  . New onset left bundle branch block (LBBB) 11/09/2017  . Colostomy obstruction from metastasis s/p colectomy/ostomy revision 11/08/2017 11/08/2017  . 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 Ascites 11/08/2017  . Hypoglycemia 11/04/2017  . Hypokalemia 11/04/2017  . Hypocalcemia 11/04/2017  . Small bowel obstruction from metastasis s/p SB resection 11/08/2017 11/03/2017  . Obstructive nephropathy 10/24/2017  . 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 Hyponatremia 10/16/2017  . Protein-calorie malnutrition, severe 07/11/2017  . Breast cancer metastasized to multiple sites ([place]) 10/16/2015  .  Genetic testing 06/24/2015  . Osteoporosis 12/06/2013  . Cancer of lower-inner quadrant of left female breast ([place]) 03/22/2013  . Breast cancer metastasized to bone ([place]) 10/03/2012   Donato Heinz. Owens Shark PT  Norwood Levo 02/13/2018, 1:39 PM  [place], [place], [address] Phone: [phone]   Fax:  [phone]  Name: Sonia Hill MRN: [id number] Date of Birth: 01/31/1956

## 2018-02-14 ENCOUNTER — Other Ambulatory Visit: Payer: Self-pay | Admitting: *Deleted

## 2018-02-14 ENCOUNTER — Other Ambulatory Visit: Payer: Self-pay | Admitting: Oncology

## 2018-02-14 ENCOUNTER — Telehealth: Payer: Self-pay | Admitting: *Deleted

## 2018-02-14 ENCOUNTER — Inpatient Hospital Stay: Payer: 59

## 2018-02-14 ENCOUNTER — Ambulatory Visit
Admission: RE | Admit: 2018-02-14 | Discharge: 2018-02-14 | Disposition: A | Discharge status: Home / Self Care | Payer: 59 | Source: Ambulatory Visit | Attending: Medical | Admitting: Medical

## 2018-02-14 DIAGNOSIS — R600 Localized edema: Secondary | ICD-10-CM

## 2018-02-14 DIAGNOSIS — C50312 Malignant neoplasm of lower-inner quadrant of left female breast: Secondary | ICD-10-CM | POA: Diagnosis not present

## 2018-02-14 DIAGNOSIS — C7951 Secondary malignant neoplasm of bone: Secondary | ICD-10-CM

## 2018-02-14 DIAGNOSIS — C50919 Malignant neoplasm of unspecified site of unspecified female breast: Secondary | ICD-10-CM

## 2018-02-14 DIAGNOSIS — N183 Chronic kidney disease, stage 3 unspecified: Secondary | ICD-10-CM

## 2018-02-14 DIAGNOSIS — N138 Other obstructive and reflux uropathy: Secondary | ICD-10-CM

## 2018-02-14 LAB — URINALYSIS, COMPLETE (UACMP) WITH MICROSCOPIC
Bilirubin Urine: NEGATIVE
GLUCOSE, UA: NEGATIVE mg/dL
Hgb urine dipstick: NEGATIVE
Ketones, ur: NEGATIVE mg/dL
Nitrite: NEGATIVE
PROTEIN: 30 mg/dL — AB
Specific Gravity, Urine: 1.011 (ref 1.005–1.030)
pH: 6 (ref 5.0–8.0)

## 2018-02-14 LAB — CANCER ANTIGEN 27.29: CAN 27.29: 1224 U/mL — AB (ref 0.0–38.6)

## 2018-02-14 NOTE — Telephone Encounter (Signed)
This RN spoke with pt per her VM wanting to follow up on CT scan ordered per visit yesterday as well as to state noted "odor from urine that is not usual ".  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  Note order has also been upgraded to STAT.  This RN notified pt of above procedure- she will go now and then come to this office for lab for urine sample.

## 2018-02-14 NOTE — Progress Notes (Signed)
Sonia Hill called earlier this week to let us know that her left leg was swollen.  The right leg was not.  We set her up for Doppler ultrasonography of the left leg which was performed yesterday and was negative.  They we obtained a noncontrast CT of the abdomen which shows the nephrostomy to be apparently working well.  We obtain a urinalysis which shows no evidence of infection  I think we are seeing lymphatic obstruction in the left leg area, just as we are seeing some thickening of the omentum and other evidence of tumor infiltration.  For now the plan is for her to use a compression stocking on the left.

## 2018-02-15 ENCOUNTER — Inpatient Hospital Stay: Payer: 59

## 2018-02-15 ENCOUNTER — Inpatient Hospital Stay (HOSPITAL_BASED_OUTPATIENT_CLINIC_OR_DEPARTMENT_OTHER): Payer: 59 | Admitting: Adult Health

## 2018-02-15 ENCOUNTER — Telehealth: Payer: Self-pay | Admitting: Adult Health

## 2018-02-15 ENCOUNTER — Ambulatory Visit: Payer: 59

## 2018-02-15 ENCOUNTER — Other Ambulatory Visit: Payer: Self-pay | Admitting: Oncology

## 2018-02-15 ENCOUNTER — Encounter: Payer: Self-pay | Admitting: Adult Health

## 2018-02-15 VITALS — BP 120/72 | HR 82 | Temp 98.4°F | Resp 17 | Ht 61.0 in | Wt 102.1 lb

## 2018-02-15 DIAGNOSIS — C7951 Secondary malignant neoplasm of bone: Secondary | ICD-10-CM | POA: Diagnosis not present

## 2018-02-15 DIAGNOSIS — C801 Malignant (primary) neoplasm, unspecified: Secondary | ICD-10-CM

## 2018-02-15 DIAGNOSIS — I890 Lymphedema, not elsewhere classified: Secondary | ICD-10-CM

## 2018-02-15 DIAGNOSIS — C786 Secondary malignant neoplasm of retroperitoneum and peritoneum: Secondary | ICD-10-CM

## 2018-02-15 DIAGNOSIS — M6281 Muscle weakness (generalized): Secondary | ICD-10-CM | POA: Diagnosis not present

## 2018-02-15 DIAGNOSIS — C784 Secondary malignant neoplasm of small intestine: Secondary | ICD-10-CM

## 2018-02-15 DIAGNOSIS — Z9221 Personal history of antineoplastic chemotherapy: Secondary | ICD-10-CM

## 2018-02-15 DIAGNOSIS — R600 Localized edema: Secondary | ICD-10-CM | POA: Diagnosis not present

## 2018-02-15 DIAGNOSIS — Z9012 Acquired absence of left breast and nipple: Secondary | ICD-10-CM

## 2018-02-15 DIAGNOSIS — Z170 Estrogen receptor positive status [ER+]: Secondary | ICD-10-CM

## 2018-02-15 DIAGNOSIS — C50312 Malignant neoplasm of lower-inner quadrant of left female breast: Secondary | ICD-10-CM | POA: Diagnosis not present

## 2018-02-15 DIAGNOSIS — Z905 Acquired absence of kidney: Secondary | ICD-10-CM

## 2018-02-15 DIAGNOSIS — Z933 Colostomy status: Secondary | ICD-10-CM

## 2018-02-15 DIAGNOSIS — M810 Age-related osteoporosis without current pathological fracture: Secondary | ICD-10-CM

## 2018-02-15 DIAGNOSIS — R262 Difficulty in walking, not elsewhere classified: Secondary | ICD-10-CM

## 2018-02-15 LAB — URINE CULTURE: Culture: 60000 — AB

## 2018-02-15 MED ORDER — FLUCONAZOLE 100 MG PO TABS: 100.0000 mg | ORAL_TABLET | Freq: Every day | ORAL | 1 refills | Status: DC

## 2018-02-15 NOTE — Patient Instructions (Signed)
Cancer Rehab [phone] Deep Effective Breath   Standing, sitting, or laying down place both hands on the belly. Take a deep breath IN, expanding the belly; then breath OUT, contracting the belly. Repeat __5__ times. Do __2-3__ sessions per day and before each self massage.  Inguinal Nodes to Axilla - Clear   On involved side, at armpit, make _5__ in-place circles. Then from hip proceed in sections to armpit with pumps _5_ times, this is your pathway. Do _1__ time per day.  Copyright  VHI. All rights reserved.  LEG: Knee to Hip - Clear   Pump up outer thigh of involved leg from knee to outer hip. Then do stationary circles from inner to outer thigh, then do outer thigh again. Next, interlace fingers behind knee IF ABLE and make in-place circles. Do _5_ times of each sequence.  Do _1__ time per day.  Copyright  VHI. All rights reserved.  LEG: Ankle to Hip Sweep   Hands on sides of ankle of involved leg, pump _5__ times up both sides of lower leg, then retrace steps up outer thigh to hip as before and back to pathway. Do _2-3_ times. Do __1_ time per day.  Copyright  VHI. All rights reserved.  FOOT: Dorsum of Foot and Toes Massage   One hand on top of foot make _5_ stationary circles or pumps, then either on top of toes or each individual toe do _5_ pumps. Then retrace all steps pumping back up both sides of lower leg, outer thigh, and then pathway. Finish with what you started with, _5_ circles at involved side arm pit. All _2-3_ times at each sequence. Do _1__ time per day.

## 2018-02-15 NOTE — Progress Notes (Signed)
I put in an order for Diflucan to her [place] given her urine yeast

## 2018-02-15 NOTE — Telephone Encounter (Signed)
Gave pt avs and calendar  °

## 2018-02-15 NOTE — Progress Notes (Addendum)
[place]  [phone] [phone]     ID: ALLEXUS OVENS   DOB: 20-[name]-1956  MR#: [id number]  GQQ#:[id number]  Patient Care Team: [name], MD as PCP - General (Family Medicine) Magrinat, Virgie Dad, MD as Consulting Physician (Oncology) Molli Posey, MD as Consulting Physician (Obstetrics and Gynecology) Laurence Spates, MD as Consulting Physician (Gastroenterology) Leighton Ruff, MD as Consulting Physician (General Surgery) Delrae Rend, MD as Consulting Physician (Endocrinology) Clarene Essex, MD as Consulting Physician (Gastroenterology) Donato Heinz, MD as Consulting Physician (Nephrology)   CHIEF COMPLAINT: Metastatic breast cancer, estrogen receptor positive  CURRENT TREATMENT: Exemestane, abemaciclib; [refuses bisphosphonates or denosumab; also refuses chemo and radiation options]  INTERVAL HISTORY: Jaiden  returns today for follow-up and treatment of her 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 She is accompanied by her husband. She is continuing on Exemestane and Abemaciclib.  She is tolerating it well.  Her bowel movements are occurring about every other day.     REVIEW OF SYSTEMS: Alandra is doing moderately well today.  She was seen earlier this week due to leg swelling and after scans/doppler and lab work it was found to be lymphedema.  She is wearing a compression hose and going to PT.  She wants to review her scans today.  She is concerned that her tumor markers are worse.  She has also noted that she is increasingly nauseated, and her stomach is making "rumbling sounds" similar to what it did when she got her bowel obstruction initially.  She has zofran to take when she absolutely has to, but avoids taking it until then due to its potential to cause constipation.  She would like to meet with Dr. [name] and review everything and talk about next steps moving forward.    Janiyla does not have fever, chills,  new headaches or vision changes.  She is without any chest pain, palpitations, or shortness of breath.  She does have some mild stomach issues noted above, but does not have diarrhea.  She is tired and her appetite is decreased.  It is difficult to gauge a weight change, because she has accumulated an increased amount of fluid in her swollen leg.     BREAST CANCER HISTORY:  From the initial intake note:  Modelle had screening mammography on 11/10/2009 showing very dense tissue with a possible distortion in the left breast.  Digital mammography on June 10th showed a 1.5 cm cluster of microcalcifications in the lower inner portion of the breast.  On physical exam, there was no palpable mass and no palpable axillary adenopathy.  There was some nipple inversion inferolaterally.  Ultrasound showed a 2.6 cm irregular mass-like area with dense posterior acoustic shadowing in the 3 o'clock position, 1 cm from the nipple, but normal appearing left axillary lymph nodes.    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  A second mass noted on ultrasound was also lobular, also low grade.  Both were ER and PR positive at well over 90%.  Both had low proliferation markers at 9%, and both were Her-2 negative by CISH with ratios of 1.6 and 1.24.  In short, both masses were nearly identical.    On June 16th, the patient had bilateral breast MRIs which showed in the left breast at 3 o'clock an irregular area of abnormal enhancement measuring up to 2.7 cm and  in the left lower inner quadrant, a second post-biopsy area measuring 1.5 cm.  The difference between these two areas was stated at the conference the morning of the visit to be approximately 5 cm.  Given this data the patient opted for Left mastectomy with sentinel lymph node sampling, performed July 2011 with results as  detailed below.  Subsequent history is as detailed below.    PAST MEDICAL HISTORY: Past Medical History:  Diagnosis Date  . Arthritis    neck, upper back, shoulders - no meds - yoga  . Chronic kidney disease    Only has right kidney  . Depression    Hx - no current problem  . GERD (gastroesophageal reflux disease)    diet controlled - no meds  . Hyperlipidemia    diet controlled  . Hypothyroidism   . lt breast ca dx'd 11/2009  . Osteoporosis, unspecified 12/06/2013  . PONV (postoperative nausea and vomiting)   . SBO (small bowel obstruction) ([place])   . SVD (spontaneous vaginal delivery)    x 1  1. Osteopenia. 2. Congenital left renal atrophy.  3. History of recurrent UTIs. 4. History of bladder reconstruction for reflux more than 18 years ago. 5. History of depression and anxiety. 6. History of hypothyroidism.  7. History of "sling procedure".  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    FAMILY HISTORY The patient's father is alive in his early 87s.  He has Alzheimer's disease. The patient's mother was diagnosed with breast cancer at the age of 86.  She died at the age of 79.  The patient's father had one out of two sisters with breast cancer.  There are other breast cancers on the mother's side.  The patient has one brother and one younger sister, neither with cancer.    GYNECOLOGIC HISTORY:  (Reviewed 12/06/2013) She is GX P1.  That pregnancy was at age 62.  Menarche was at age 43.  She was perimenopausal at the time of diagnosis but is now convincingly postmenopausal  SOCIAL HISTORY:  (Reviewed 12/06/2013) She works as a Electrical engineer, part-time and on consultation.  Her husband, [name], is a Architect at [employer].  [name] works as a Pharmacist, hospital in [place].  The patient's son [name] is  in college.  The patient attends [employer].   ADVANCED DIRECTIVES: in place  HEALTH  MAINTENANCE: (Updated 12/06/2013)  Social History   Tobacco Use  . Smoking status: Never Smoker  . Smokeless tobacco: Never Used  Substance Use Topics  . Alcohol use: No  . Drug use: No     Colonoscopy: 06/18/2015; [facility]  PAP: UTD/May 2015, Dr. Matthew Saras  Bone density: [name] 2014, osteoporosis  Lipid panel: UTD (Not on file)    Allergies  Allergen Reactions  . Sulfa Antibiotics Rash  . Adhesive [Tape] Rash    Ok to use paper tape and tegaderm over IV site  . Betadine [Povidone Iodine] Rash  . Mercury Rash    Reaction mercurachrome    Current Outpatient Medications  Medication Sig Dispense Refill  . abemaciclib (VERZENIO) 100 MG tablet Take 1 tablet (100 mg total) by mouth 2 (two) times daily. Swallow tablets whole. Do not chew, crush, or split tablets before swallowing. 56 tablet 5  . ALPRAZolam (XANAX) 0.25 MG tablet TAKE 1 TABLET BY MOUTH EVERY 8 HOURS AS NEEDED FOR ANXIETY 30 tablet 0  . CALCIUM PO Take 2 tablets by  mouth 2 (two) times daily.    . cetirizine (ZYRTEC) 10 MG tablet Take 10 mg by mouth daily.    . cholecalciferol (D-VI-SOL) 400 UNIT/ML LIQD Take 400 Units by mouth daily.    . D-MANNOSE PO Take 1 tablet by mouth daily.    . Digestive Enzymes (DIGESTIVE ENZYME PO) Take 1 tablet by mouth daily.    Marland Kitchen exemestane (AROMASIN) 25 MG tablet Take 1 tablet (25 mg total) by mouth daily after breakfast. 90 tablet 4  . gabapentin (NEURONTIN) 300 MG capsule Take 1 capsule (300 mg total) by mouth 3 (three) times daily as needed. (Patient taking differently: Take 300 mg by mouth 3 (three) times daily as needed (nerve pain). ) 90 capsule 1  . Multiple Vitamin (MULTIVITAMIN WITH MINERALS) TABS tablet Take 1 tablet by mouth daily.    . Omega-3 Fatty Acids (FISH OIL PO) Take 5 mLs by mouth daily.    . ondansetron (ZOFRAN-ODT) 8 MG disintegrating tablet Take 1 tablet (8 mg total) by mouth every 8 (eight) hours as needed for nausea or vomiting. 40 tablet 1  . Probiotic  Product (PROBIOTIC DAILY PO) Take 1 tablet by mouth daily.    . simethicone (MYLICON) 935 MG chewable tablet Chew 125 mg by mouth every 2 (two) hours as needed for flatulence.     . Thyroid (NATURE-THROID) 81.25 MG TABS Take 81.25 mg by mouth daily.    . furosemide (LASIX) 40 MG tablet Take 1 tablet (40 mg total) by mouth daily. 30 tablet 0  . heparin lock flush 100 UNIT/ML SOLN injection Inject 2.5 mLs (250 Units total) into the vein daily for 30 doses. 75 mL 0   No current facility-administered medications for this visit.     OBJECTIVE:   Vitals:   02/15/18 1339  BP: 120/72  Pulse: 82  Resp: 17  Temp: 98.4 F (36.9 C)  SpO2: 100%     Body mass index is 19.29 kg/m.    ECOG FS: 2 Filed Weights   02/15/18 1339  Weight: 102 lb 1.6 oz (46.3 kg)  GENERAL: Patient is a well appearing female in no acute distress HEENT:  Sclerae anicteric.  Oropharynx clear and moist. No ulcerations or evidence of oropharyngeal candidiasis. Neck is supple.  NODES:  No cervical, supraclavicular, or axillary lymphadenopathy palpated.  BREAST EXAM:  Left breast surgically absent, no nodules or masses, right breast without nodules, masses or any concern LUNGS:  Clear to auscultation bilaterally.  No wheezes or rhonchi. HEART:  Regular rate and rhythm. No murmur appreciated. ABDOMEN:  Soft, nontender.  Positive, normoactive bowel sounds. No organomegaly palpated.  MSK:  No focal spinal tenderness to palpation. Full range of motion bilaterally in the upper extremities. EXTREMITIES:  No peripheral edema.   SKIN:  Clear with no obvious rashes or skin changes. No nail dyscrasia. NEURO:  Nonfocal. Well oriented.  Appropriate affect.    LAB RESULTS:   No results found for: CA125 Lab Results  Component Value Date   LABCA2 181 (H) 10/23/2015  No results found for: CEA    Lab Results  Component Value Date   WBC 5.1 02/13/2018   NEUTROABS 4.0 02/13/2018   HGB 11.0 (L) 02/13/2018   HCT 33.2 (L)  02/13/2018   MCV 95.1 02/13/2018   PLT 288 02/13/2018      Chemistry      Component Value Date/Time   NA 134 (L) 02/13/2018 1541   NA 134 (L) 12/28/2016 1503   K 4.9 02/13/2018  1541   K 4.0 12/28/2016 1503   CL 97 (L) 02/13/2018 1541   CL 98 12/03/2012 1419   CO2 27 02/13/2018 1541   CO2 25 12/28/2016 1503   BUN 23 02/13/2018 1541   BUN 12.0 12/28/2016 1503   CREATININE 1.46 (H) 02/13/2018 1541   CREATININE 0.8 12/28/2016 1503      Component Value Date/Time   CALCIUM 10.7 (H) 02/13/2018 1541   CALCIUM 9.7 12/28/2016 1503   ALKPHOS 75 02/13/2018 1541   ALKPHOS 76 12/28/2016 1503   AST 41 02/13/2018 1541   AST 22 12/28/2016 1503   ALT 7 02/13/2018 1541   ALT 20 12/28/2016 1503   BILITOT 0.4 02/13/2018 1541   BILITOT 0.50 12/28/2016 1503      STUDIES: Ct Abdomen Pelvis Wo Contrast  Result Date: 02/14/2018 CLINICAL DATA:  Stage IV breast cancer. Abdominal pain. Progressive left lower extremity swelling. EXAM: CT ABDOMEN AND PELVIS WITHOUT CONTRAST TECHNIQUE: Multidetector CT imaging of the abdomen and pelvis was performed following the standard protocol without IV contrast. COMPARISON:  01/13/2018 CT abdomen/pelvis. FINDINGS: Lower chest: Trace dependent bilateral pleural effusions. Subpleural 3 mm basilar right lower lobe pulmonary nodule (series 9/image 7), not appreciably changed. Hepatobiliary: Normal liver size. No liver mass. Normal gallbladder with no radiopaque cholelithiasis. No biliary ductal dilatation. Pancreas: Normal, with no mass or duct dilation. Spleen: Normal size. No mass. Adrenals/Urinary Tract: No discrete adrenal nodules. Left nephrectomy. Well-positioned right percutaneous nephrostomy tube with distal pigtail portion in the central right renal collecting system. No right hydronephrosis. No right renal stones. No contour deforming right renal mass. Bladder is nearly collapsed. Previously described infiltrative deep pelvic soft tissue mass along the posterior  bladder wall measures approximately 7.8 x 4.2 cm, previously 7.8 x 4.1 cm using similar measurement technique, not appreciably changed. Stomach/Bowel: Stomach mildly distended with air-fluid level. There are multiple newly mildly dilated small bowel loops with air-fluid levels throughout the abdomen measuring up to 4.0 cm diameter in the left abdomen (series 2/image 30). No discrete small bowel caliber transition. Status post partial distal colectomy with end colostomy in the ventral left abdominal wall. Mild-to-moderate stool in right and remnant transverse colon without acute wall thickening in this portion of the colon. Stable marked circumferential wall thickening in the remnant sigmoid colon and rectum. Vascular/Lymphatic: Atherosclerotic nonaneurysmal abdominal aorta. Poorly defined enlarged 1.6 cm left para-aortic node (series 2/image 36), previously 1.4 cm, slightly increased. Stable mildly enlarged 1.3 cm left inguinal node (series 2/image 69). Reproductive: No discrete adnexal mass. Other: No pneumoperitoneum. Small to moderate volume ascites, slightly increased. Irregular diffuse peritoneal thickening throughout the abdomen and pelvis, which appears slightly increased, for example in the right paracolic gutter measuring up to 0.5 cm thickness, previously 0.3 cm. Musculoskeletal: Stable mixed lytic and sclerotic lesions in T12, L1 and L3 vertebral bodies. No new focal osseous lesions. Mild thoracolumbar spondylosis. Moderate anasarca, worsened. IMPRESSION: 1. Study significantly limited by absence of oral and IV contrast. 2. Well-positioned right percutaneous nephrostomy tube. No right hydronephrosis. 3. Multiple newly mildly dilated small bowel loops with air-fluid levels throughout the abdomen, without discrete small bowel caliber transition. Findings suggest mild mechanical mid to distal small bowel obstruction versus adynamic ileus. 4. Mild retroperitoneal adenopathy is mildly increased. Left inguinal  adenopathy is stable. 5. Irregular diffuse peritoneal thickening throughout the abdomen and pelvis, slightly worsened, compatible with peritoneal carcinomatosis. Infiltrative deep pelvic soft tissue mass and marked circumferential wall thickening in the remnant sigmoid colon and rectum are unchanged  and compatible with peritoneal malignancy. Small to moderate volume ascites, slightly increased. 6. Trace dependent bilateral pleural effusions. 7. Scattered mixed lytic and sclerotic osseous lesions in the spine are stable. 8. Moderate anasarca, worsened. 9.  Aortic Atherosclerosis (ICD10-I70.0). These results will be called to the ordering clinician or representative by the Radiology Department at the imaging location. Electronically Signed   By: Ilona Sorrel M.D.   On: 02/14/2018 14:10   Ir Nephrostogram Right Thru Existing Access  Result Date: 02/08/2018 INDICATION: Leaking nephrostomy 2 EXAM: RIGHT NEPHROSTOGRAM COMPARISON:  None. MEDICATIONS: None ANESTHESIA/SEDATION: None CONTRAST:  5 cc Isovue-300-administered into the collecting system(s) FLUOROSCOPY TIME:  Fluoroscopy Time:  minutes 18 seconds (1.7 mGy). COMPLICATIONS: None immediate. PROCEDURE: Contrast was injected into the right nephrostomy tube and imaging was obtained. FINDINGS: The nephrostomy tube is coiled in the right renal pelvis. There is no extravasation of contrast. It is widely patent. IMPRESSION: Right nephrostomy catheter is functioning adequately. Electronically Signed   By: Marybelle Killings M.D.   On: 02/08/2018 17:23     ASSESSMENT: 62 y.o.  BRCA 1-2 negative [facility] woman with lobular breast cancer stage IV at presentation July 2011  (1) status post left mastectomy and sentinel lymph node sampling in July 2011 for a lower inner quadrant T1 N1 M1, stage IV invasive lobular breast cancer, grade 1, strongly estrogen and progesterone receptor-positive, HER2 negative with MIB-1 of 9% and no HER2 amplification,   (2) with multiple  sclerotic bone lesions at presentation seen only on CT scan (not on bone scan or PET scan), but  with biopsy-proven metastatic disease to bone; and an elevated CA 27.29 at presentation,   (3) Oncotype recurrence score of 4, predicts a good response to antiestrogens.  (4) Systemic treatment has consisted of  a) tamoxifen with evidence of response but poor tolerance  b) letrozole starting August 2012, discontinued October 2014 per patient   (5) single functioning kidney  (6) status post bilateral salpingo-oophorectomy 01/24/2013, with benign pathology  (7) osteoporosis; the patient refuses bisphosphonate therapy; started [facility] February 2014.    (a) bone density was obtained under the care of Dr. Matthew Saras at [facility], 06/25/2012, showing osteoporosis with a T score -2.6. This was repeated 12/20/2013, showing again osteoporosis with T-scores between -2.4 and -2.8.  (b) the patient refuses zolendronate, denosumab, or other pharmacologic intervention  (c) bone density on 12/30/2015 under Dr. Matthew Saras showing osteoporosis with T scores between -2.7 and -3.3  (8) the patient refuses standard Mammography or tomography; undergoing thermography screening of the right breast.  (9) PET scan 06/05/2015 shows rectal thickening and a presacral mass; biopsy of this area 10/09/2015 confirms metastatic lobular breast cancer, again estrogen receptor positive, HER-2 nonamplified.  (a) pelvic MRI 03/11/2016 confirms stability of disease  (b) chest CT and pelvic MRI 09/02/2016 shows no change in the circumferential rectal thickening or evidence of extension beyond the serosa and chest CT findings  (c) colonoscopy 02/23/2017 showed a very narrow rectal lumen with a few apparently uninvolved centimeters distally   (d) CT of the abdomen and pelvis 05/22/2017 (after diverting colostomy) shows interval increase in the size of the perirectal soft tissue masses.  (10) started fulvestrant and  palbociclib 125 mg/ day [21/7] May 2017  (a) palbociclib dose decreased to 100 mg daily [21/7] with second cycle, started 11/25/2015  (b) palbociclib discontinued 03/20/2017 with evidence of disease progression  (c) last fulvestrant dose 03/24/2017, discontinued with evidence of progression  (11) status post [facility] 2018  (12) started exemestane 07/05/2017,  (a) everolimus 5 mg/d started 07/14/2017--held as of 08/20/2017 secondary to rash  (b) exemestane stopped by patient 09/04/2017 to try "the natural way".  13) exemestane resumed 10/16/2017             (a) abemaciclib/Verzenio added 11/29/2017  (14) s/p laparoscopic small bowel resection for SBO 11/08/2017             (a) peritoneal fluid 11/17/2017 growing enterococcus, vanc sensitive  (b) completed antibiotics 11/27/2017    PLAN:  Yasaman is doing moderately well today. She met with myself and Dr. [name].  She will continue her treatment with exemestane and Abemaciclib.  She is tolerating it well.  Dr. [name] reviewed her CT scan with her, and let her know that there are no other treatment options for her, so he recommended continuing on her current treatment.  In the meantime, she will also continue to go to PT for treatment of her lymphedema.    We will see Pyper back in 4 weeks for labs and f/u.  She knows to call for any questions or concerns that may arise between now and her next appointment.    A total of (30) minutes of face-to-face time was spent with this patient with greater than 50% of that time in counseling and care-coordination.  Wilber Bihari, NP 02/15/18 1:47 PM Medical Oncology and Hematology [facility] [address] [place], [place] [address] Tel. [phone]    Fax. [phone]   ADDENDUM: Saphira is becoming more symptomatic and while we do not have frank bowel obstruction we do have what appears to be ileus.  She is having more issues  regarding nausea and even some vomiting.  I think she would benefit from having a PEG in place and I have order a gastrostomy to be placed by IR on 02/23/2018.  Note that the patient also has discussed her situation with Dr. gross.  The patient is aware that no further surgery is planned and that a gastrostomy tube would be placed by IR and not by Dr. gross.  Unfortunately we do not have any further treatments for Mahealani.  She does not want chemo and the truth his with a cancer as slow-growing as her's chemotherapy is unlikely to do more than at toxicity.  If we document disease progression or she develops frank obstruction it would be appropriate to proceed to hospice referral.  I personally saw this patient and performed a substantive portion of this encounter with the listed APP documented above.   Chauncey Cruel, MD Medical Oncology and Hematology [facility] [address] [place], [place] [address] Tel. [phone]    Fax. [phone]

## 2018-02-15 NOTE — Therapy (Signed)
[place], [place], [address] Phone: [phone]   Fax:  [phone]  Physical Therapy Treatment  Patient Details  Name: Sonia Hill MRN: [id number] Date of Birth: 1956-06-05 Referring Provider: Dr. Vinie Sill    Encounter Date: 02/15/2018  PT End of Session - 02/15/18 1111    Visit Number  8    Number of Visits  16    Date for PT Re-Evaluation  03/02/18    PT Start Time  1021    PT Stop Time  1108    PT Time Calculation (min)  47 min    Activity Tolerance  Patient tolerated treatment well    Behavior During Therapy  [facility] for tasks assessed/performed       Past Medical History:  Diagnosis Date  . Arthritis    neck, upper back, shoulders - no meds - yoga  . Chronic kidney disease    Only has right kidney  . Depression    Hx - no current problem  . GERD (gastroesophageal reflux disease)    diet controlled - no meds  . Hyperlipidemia    diet controlled  . Hypothyroidism   . lt breast ca dx'd 11/2009  . Osteoporosis, unspecified 12/06/2013  . PONV (postoperative nausea and vomiting)   . SBO (small bowel obstruction) ([place])   . SVD (spontaneous vaginal delivery)    x 1    Past Surgical History:  Procedure Laterality Date  . ABDOMINAL SURGERY    . BLADDER NECK RECONSTRUCTION     made a new urethea tube  . bladder tact  2008  . BOWEL RESECTION N/A 11/08/2017   Procedure: SMALL BOWEL RESECTION;  Surgeon: Michael Boston, MD;  [facility];  Service: General;  Laterality: N/A;  . BREAST SURGERY     masectomy left breast  . coloscopy    . COLOSTOMY    . DILATION AND CURETTAGE OF UTERUS    . GASTROSTOMY N/A 11/08/2017   Procedure: INSERTION OF GASTROSTOMY TUBE;  Surgeon: Michael Boston, MD;  [facility];  Service: General;  Laterality: N/A;  . IR NEPHROSTOGRAM RIGHT THRU EXISTING ACCESS  02/08/2018  . IR NEPHROSTOMY PLACEMENT RIGHT  01/14/2018  . LAPAROSCOPY N/A 01/24/2013   Procedure:  LAPROSCOPY OPERATIVE;  Surgeon: Margarette Asal, MD;  Location: [facility];  Service: Gynecology;  Laterality: N/A;  . LAPAROSCOPY N/A 11/08/2017   Procedure: LAPAROSCOPY DIAGNOSTIC;  Surgeon: Michael Boston, MD;  [facility];  Service: General;  Laterality: N/A;  . LYSIS OF ADHESION N/A 11/08/2017   Procedure: LYSIS OF ADHESION;  Surgeon: Michael Boston, MD;  [facility];  Service: General;  Laterality: N/A;  . NEPHRECTOMY Left    left kidney dysfunction, recurrent pyelonephritis  . PARTIAL COLECTOMY N/A 11/08/2017   Procedure: PARTIAL COLECTOMY WITH NEW COLOSTOMY;  Surgeon: Michael Boston, MD;  [facility];  Service: General;  Laterality: N/A;  . SALPINGOOPHORECTOMY Bilateral 01/24/2013   Procedure: SALPINGO OOPHORECTOMY;  Surgeon: Margarette Asal, MD;  Location: [facility];  Service: Gynecology;  Laterality: Bilateral;  . WISDOM TOOTH EXTRACTION      There were no vitals filed for this visit.  Subjective Assessment - 02/15/18 1026    Subjective  All my scans were good from the new swelling in my Lt leg. They said my nephrostomy is functioning well which was a big relief. And I do not have an infection. So I have been dealing with alot of nauseous so I see  the doctor again today to talk about the cancer and I'll bring up the nauseous issue.  Fo rnow I've been cleared to wear my compression stocking so I'm wearing my flat knit today.     Pertinent History  BRCA 1-2 negative with lobular breast cancer stage IV at presentation July 2011 status post left mastectomy and sentinel lymph node sampling in July 2011 for a lower inner quadrant T1 N1 M1, stage IV invasive lobular breast cancer, grade 1, strongly estrogen and progesterone receptor-positive, HER2 negative with MIB-1 of 9% and no HER2 amplification, with multiple sclerotic bone lesions at presentation seen only on CT scan (not on bone scan or PET scan), but  with biopsy-proven metastatic disease to bone; and an elevated CA 27.29 at  presentation, single functioning kidney, status post bilateral salpingo-oophorectomy 01/24/2013, with benign pathology osteoporosis; the patient refuses bisphosphonate therapy; started [facility] February 2014, T score -2.7 and -3., s/p colostomy, recent abdominal cellulits    Patient Stated Goals  to have more endurance     Currently in Pain?  Yes    Pain Score  4     Pain Location  Pelvis    Pain Orientation  Lower    Pain Descriptors / Indicators  Discomfort    Pain Type  Chronic pain    Pain Onset  More than a month ago    Pain Frequency  Constant    Aggravating Factors   sitting on hard benches and chair    Pain Relieving Factors  sitting on donut pillow                  Outpatient Re[facility]  Lymphedema Life Impact Scale Total Score  60.29 %           [facility] Adult PT Treatment/Exercise - 02/15/18 0001      Self-Care   Other Self-Care Comments   Discussed nighttime compression garment options with pt and she would like to puruse a thigh high option and go through [employer] as fitter will be at our clinic Monday.      Manual Therapy   Manual therapy comments  --    Edema Management  Donned pts thigh high flat knit compression stocking after session    Manual Lymphatic Drainage (MLD)  Short neck, Stationary circles to left axillary nodes, establishment of left inguinal axillary anastamosis, left thigh , knee, lower leg and foot with return along pathyways              PT Education - 02/15/18 1108    Education Details  Self MLD and discussed options for a nighttime garment. Pt would like to come and be measured for a velcro nighttime compression garment by Jerrol Banana from [employer].     Person(s) Educated  Patient    Methods  Explanation;Demonstration;Handout    Comprehension  Verbalized understanding;Returned demonstration          PT Long Term Goals - 01/30/18 1250      PT LONG TERM  GOAL #1   Title  Pt will receive appropriate compression garments for management of bilateral LE, genital and abdominal swelling to decrease risk of infection    Status  Achieved      PT LONG TERM GOAL #2   Title  Pt will be independent in self MLD for long term management of lymphedema    Status  Deferred      PT LONG TERM GOAL #3  Title  Pt will report a 25% improvmement in bilateral LE, swelling to allow improved comfort    Status  Deferred      PT LONG TERM GOAL #4   Title  Pt will report a 25% improvmement in bilateral LE, swelling to allow improved comfort    Status  Deferred      PT LONG TERM GOAL #5   Title  Pt will reports she has about 50% more endurance allowing her to do more activities at home.     Time  4    Period  Weeks    Status  New      Additional Long Term Goals   Additional Long Term Goals  Yes      PT LONG TERM GOAL #6   Title  Pt will impove # of sit to stand to 14 in 30 seconds indicating and improvment in funcitonal strength     Baseline  12    Time  4    Period  Weeks    Status  New      PT LONG TERM GOAL #7   Title  Pt will decrease TUG time to < 6 seconds indicating an improvment in functional mobility and balance     Baseline  6.7 sec    Time  4    Period  Weeks    Status  New      PT LONG TERM GOAL #8   Title  Pt will be independent in a home exercise program for strength and endurance     Time  4    Period  Weeks    Status  New            Plan - 02/15/18 1111    Clinical Impression Statement  Pt came in with good news from her doctor. Her nephrostomy is well functioning so new onset of lymphedema is from cancer in pelvis, not nephrostomy being blocked. So we were able to resume manual lymph drainage today reviewing with pt and issuing handout for this at end of session. She also came in with her flat knit thigh high on per doctors order, so donned this for her at end of session. Pt also interested in thigh high nighttime compression  garment so will come on Monday for measurments for that as fitter from [facility] will be here. After discussing with Maudry Diego, PT considering [employer] night low profile may be best option if pts insurance will cover.     Rehab Potential  Good    Clinical Impairments Affecting Rehab Potential  1 kidney functioning, metastatic disease, osteoporosis, genital, abdominal and bilateral LE swelling    PT Frequency  2x / week    PT Duration  3 weeks    PT Treatment/Interventions  ADLs/Self Care Home Management;Therapeutic activities;Therapeutic exercise;Patient/family education;Manual lymph drainage;Manual techniques;Compression bandaging;Passive range of motion;Vasopneumatic Device;Taping;Orthotic Fit/Training    PT Next Visit Plan  Reassess and progress with exercise or lymphedema treatment as indicated. Assess wear of flat knit and self MLD.     Recommended Other Services  Order sent to Dr. [name] for nighttime compression garment demographics sent to [facility].     Consulted and Agree with Plan of Care  Patient       Patient will benefit from skilled therapeutic intervention in order to improve the following deficits and impairments:  Increased edema, Difficulty walking, Decreased knowledge of precautions, Decreased endurance, Decreased strength, Pain, Decreased activity tolerance, Impaired perceived functional ability, Decreased mobility  Visit  Diagnosis: Muscle weakness (generalized)  Difficulty in walking, not elsewhere classified  Lymphedema, not elsewhere classified     Problem List Patient Active Problem List   Diagnosis Date Noted  . CKD (chronic kidney disease), stage III ([place]) 01/15/2018  . Acute lower UTI 01/14/2018  . Pelvic mass 01/14/2018  . Leukopenia due to antineoplastic chemotherapy ([place]) 01/14/2018  . SIRS (systemic inflammatory response syndrome) ([place]) 01/14/2018  . Acute kidney injury superimposed on CKD ([place]) 01/13/2018  . ARF (acute renal failure) ([place]) 01/13/2018   . Acute kidney injury ([place])   . Gross hematuria   . Anemia 11/12/2017  . Malnutrition of moderate degree 11/11/2017  . Metastatic breast cancer to small intestine causing SBO s/p SB resection 11/08/2017 11/10/2017  . 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 Cachexia ([place]) 11/10/2017  . New onset left bundle branch block (LBBB) 11/09/2017  . Colostomy obstruction from metastasis s/p colectomy/ostomy revision 11/08/2017 11/08/2017  . 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 Ascites 11/08/2017  . Hypoglycemia 11/04/2017  . Hypokalemia 11/04/2017  . Hypocalcemia 11/04/2017  . Small bowel obstruction from metastasis s/p SB resection 11/08/2017 11/03/2017  . Obstructive nephropathy 10/24/2017  . 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 Hyponatremia 10/16/2017  . Protein-calorie malnutrition, severe 07/11/2017  . Breast cancer metastasized to multiple sites ([place]) 10/16/2015  . Genetic testing 06/24/2015  . Osteoporosis 12/06/2013  . Cancer of lower-inner quadrant of left female breast ([place]) 03/22/2013  . Breast cancer metastasized to bone [facility]) 10/03/2012    Otelia Limes, PTA 02/15/2018, 12:20 PM  [place], [place], [address] Phone: [phone]   Fax:  [phone]  Name: Sonia Hill MRN: [id number] Date of Birth: 02-28-1956

## 2018-02-16 NOTE — Progress Notes (Signed)
These preliminary result these preliminary results were noted.  Awaiting final report.

## 2018-02-20 ENCOUNTER — Encounter: Payer: Self-pay | Admitting: Physical Therapy

## 2018-02-20 ENCOUNTER — Ambulatory Visit: Payer: 59 | Admitting: Physical Therapy

## 2018-02-20 DIAGNOSIS — M6281 Muscle weakness (generalized): Secondary | ICD-10-CM

## 2018-02-20 DIAGNOSIS — I890 Lymphedema, not elsewhere classified: Secondary | ICD-10-CM

## 2018-02-20 DIAGNOSIS — R262 Difficulty in walking, not elsewhere classified: Secondary | ICD-10-CM

## 2018-02-20 NOTE — Therapy (Signed)
[place], [place], [address] Phone: [phone]   Fax:  [phone]  Physical Therapy Treatment  Patient Details  Name: Sonia Hill MRN: [id number] Date of Birth: 10/28/1955 Referring Provider: Dr. Vinie Sill    Encounter Date: 02/20/2018  PT End of Session - 02/20/18 1257    Visit Number  9    Number of Visits  16    Date for PT Re-Evaluation  03/02/18    PT Start Time  1100    PT Stop Time  1145    PT Time Calculation (min)  45 min    Activity Tolerance  Patient tolerated treatment well    Behavior During Therapy  [facility] for tasks assessed/performed       Past Medical History:  Diagnosis Date  . Arthritis    neck, upper back, shoulders - no meds - yoga  . Chronic kidney disease    Only has right kidney  . Depression    Hx - no current problem  . GERD (gastroesophageal reflux disease)    diet controlled - no meds  . Hyperlipidemia    diet controlled  . Hypothyroidism   . lt breast ca dx'd 11/2009  . Osteoporosis, unspecified 12/06/2013  . PONV (postoperative nausea and vomiting)   . SBO (small bowel obstruction) ([place])   . SVD (spontaneous vaginal delivery)    x 1    Past Surgical History:  Procedure Laterality Date  . ABDOMINAL SURGERY    . BLADDER NECK RECONSTRUCTION     made a new urethea tube  . bladder tact  2008  . BOWEL RESECTION N/A 11/08/2017   Procedure: SMALL BOWEL RESECTION;  Surgeon: Michael Boston, MD;  [facility];  Service: General;  Laterality: N/A;  . BREAST SURGERY     masectomy left breast  . coloscopy    . COLOSTOMY    . DILATION AND CURETTAGE OF UTERUS    . GASTROSTOMY N/A 11/08/2017   Procedure: INSERTION OF GASTROSTOMY TUBE;  Surgeon: Michael Boston, MD;  [facility];  Service: General;  Laterality: N/A;  . IR NEPHROSTOGRAM RIGHT THRU EXISTING ACCESS  02/08/2018  . IR NEPHROSTOMY PLACEMENT RIGHT  01/14/2018  . LAPAROSCOPY N/A 01/24/2013   Procedure:  LAPROSCOPY OPERATIVE;  Surgeon: Margarette Asal, MD;  Location: [facility];  Service: Gynecology;  Laterality: N/A;  . LAPAROSCOPY N/A 11/08/2017   Procedure: LAPAROSCOPY DIAGNOSTIC;  Surgeon: Michael Boston, MD;  [facility];  Service: General;  Laterality: N/A;  . LYSIS OF ADHESION N/A 11/08/2017   Procedure: LYSIS OF ADHESION;  Surgeon: Michael Boston, MD;  [facility];  Service: General;  Laterality: N/A;  . NEPHRECTOMY Left    left kidney dysfunction, recurrent pyelonephritis  . PARTIAL COLECTOMY N/A 11/08/2017   Procedure: PARTIAL COLECTOMY WITH NEW COLOSTOMY;  Surgeon: Michael Boston, MD;  [facility];  Service: General;  Laterality: N/A;  . SALPINGOOPHORECTOMY Bilateral 01/24/2013   Procedure: SALPINGO OOPHORECTOMY;  Surgeon: Margarette Asal, MD;  Location: [facility];  Service: Gynecology;  Laterality: Bilateral;  . WISDOM TOOTH EXTRACTION      There were no vitals filed for this visit.  Subjective Assessment - 02/20/18 1251    Subjective  Pt states she has been having difficulty with nausea the past several days.  She got measured for her nighttime garment yesterday, but is not sure what kind she got.  She wore her compression stocking all day yesterday and felt that it helped  her leg to reduce     Pertinent History  BRCA 1-2 negative with lobular breast cancer stage IV at presentation July 2011 status post left mastectomy and sentinel lymph node sampling in July 2011 for a lower inner quadrant T1 N1 M1, stage IV invasive lobular breast cancer, grade 1, strongly estrogen and progesterone receptor-positive, HER2 negative with MIB-1 of 9% and no HER2 amplification, with multiple sclerotic bone lesions at presentation seen only on CT scan (not on bone scan or PET scan), but  with biopsy-proven metastatic disease to bone; and an elevated CA 27.29 at presentation, single functioning kidney, status post bilateral salpingo-oophorectomy 01/24/2013, with benign pathology osteoporosis; the  patient refuses bisphosphonate therapy; started [facility] February 2014, T score -2.7 and -3., s/p colostomy, recent abdominal cellulits    Patient Stated Goals  to have more endurance     Currently in Pain?  Yes    Pain Score  --   did not rate   Pain Location  --   nausea                 Outpatient Rehab from 01/03/2018 in [place]  Lymphedema Life Impact Scale Total Score  60.29 %           [facility] Adult PT Treatment/Exercise - 02/20/18 0001      Exercises   Exercises  Other Exercises    Other Exercises   talked about strength ABC program and gave pt a copy of just the resistance exercise portion for her to look over as ideas of exercise to do at home        Knee/Hip Exercises: Supine   Other Supine Knee/Hip Exercises  ankle pumps, quad sets and straight leg raise of left leg just lifting off of bolster to strengthen quads       Manual Therapy   Edema Management  applied piece of alevyn over anterior ankle crease in attempt to keep stocking from digging into skin there Donned pts thigh high flat knit compression stocking after session    Manual Lymphatic Drainage (MLD)  left thigh , knee, lower leg and foot with return along pathyways              PT Education - 02/20/18 1257    Education Details  Began talking about strength ABC program     Person(s) Educated  Patient    Methods  Explanation;Handout    Comprehension  Need further instruction          PT Long Term Goals - 01/30/18 1250      PT LONG TERM GOAL #1   Title  Pt will receive appropriate compression garments for management of bilateral LE, genital and abdominal swelling to decrease risk of infection    Status  Achieved      PT LONG TERM GOAL #2   Title  Pt will be independent in self MLD for long term management of lymphedema    Status  Deferred      PT LONG TERM GOAL #3   Title  Pt will report a 25% improvmement in bilateral LE, swelling to allow  improved comfort    Status  Deferred      PT LONG TERM GOAL #4   Title  Pt will report a 25% improvmement in bilateral LE, swelling to allow improved comfort    Status  Deferred      PT LONG TERM GOAL #5   Title  Pt will reports she has  about 50% more endurance allowing her to do more activities at home.     Time  4    Period  Weeks    Status  New      Additional Long Term Goals   Additional Long Term Goals  Yes      PT LONG TERM GOAL #6   Title  Pt will impove # of sit to stand to 14 in 30 seconds indicating and improvment in funcitonal strength     Baseline  12    Time  4    Period  Weeks    Status  New      PT LONG TERM GOAL #7   Title  Pt will decrease TUG time to < 6 seconds indicating an improvment in functional mobility and balance     Baseline  6.7 sec    Time  4    Period  Weeks    Status  New      PT LONG TERM GOAL #8   Title  Pt will be independent in a home exercise program for strength and endurance     Time  4    Period  Weeks    Status  New            Plan - 02/20/18 1257    Clinical Impression Statement  Pt reports some relieft with treatment today and reports she felt better when she left.  Lymphedem in leg seems better today than last week.     Clinical Impairments Affecting Rehab Potential  1 kidney functioning, metastatic disease, osteoporosis, genital, abdominal and bilateral LE swelling    PT Frequency  2x / week    PT Duration  3 weeks    PT Next Visit Plan  Remeasure and progress with exercise or lymphedema treatment as indicated. Assess wear of flat knit and self MLD.        Patient will benefit from skilled therapeutic intervention in order to improve the following deficits and impairments:  Increased edema, Difficulty walking, Decreased knowledge of precautions, Decreased endurance, Decreased strength, Pain, Decreased activity tolerance, Impaired perceived functional ability, Decreased mobility  Visit Diagnosis: Muscle weakness  (generalized)  Difficulty in walking, not elsewhere classified  Lymphedema, not elsewhere classified     Problem List Patient Active Problem List   Diagnosis Date Noted  . CKD (chronic kidney disease), stage III ([place]) 01/15/2018  . Acute lower UTI 01/14/2018  . Pelvic mass 01/14/2018  . Leukopenia due to antineoplastic chemotherapy ([place]) 01/14/2018  . SIRS (systemic inflammatory response syndrome) ([place]) 01/14/2018  . Acute kidney injury superimposed on CKD ([place]) 01/13/2018  . ARF (acute renal failure) ([place]) 01/13/2018  . Acute kidney injury ([place])   . Gross hematuria   . Anemia 11/12/2017  . Malnutrition of moderate degree 11/11/2017  . Metastatic breast cancer to small intestine causing SBO s/p SB resection 11/08/2017 11/10/2017  . 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 Cachexia ([place]) 11/10/2017  . New onset left bundle branch block (LBBB) 11/09/2017  . Colostomy obstruction from metastasis s/p colectomy/ostomy revision 11/08/2017 11/08/2017  . 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 Ascites 11/08/2017  . Hypoglycemia 11/04/2017  . Hypokalemia 11/04/2017  . Hypocalcemia 11/04/2017  . Small bowel obstruction from metastasis s/p SB resection 11/08/2017 11/03/2017  . Obstructive nephropathy 10/24/2017  . 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 Hyponatremia 10/16/2017  . Protein-calorie malnutrition, severe 07/11/2017  . Breast  cancer metastasized to multiple sites ([place]) 10/16/2015  . Genetic testing 06/24/2015  . Osteoporosis 12/06/2013  . Cancer of lower-inner quadrant of left female breast ([place]) 03/22/2013  . Breast cancer metastasized to bone ([place]) 10/03/2012   Donato Heinz. Owens Shark PT  Norwood Levo 02/20/2018, 12:59 PM  [place], [place], [address] Phone: [phone]   Fax:   [phone]  Name: Sonia Hill MRN: [id number] Date of Birth: [birth year]

## 2018-02-21 ENCOUNTER — Other Ambulatory Visit: Payer: Self-pay | Admitting: Oncology

## 2018-02-21 ENCOUNTER — Other Ambulatory Visit: Payer: Self-pay | Admitting: Radiology

## 2018-02-21 DIAGNOSIS — C786 Secondary malignant neoplasm of retroperitoneum and peritoneum: Secondary | ICD-10-CM

## 2018-02-21 DIAGNOSIS — C801 Malignant (primary) neoplasm, unspecified: Principal | ICD-10-CM

## 2018-02-21 NOTE — Progress Notes (Unsigned)
Sonia Hill has been having more nausea and her CT scan obtained 09/04, which we have discussed, shows some dilated bowel loop suggesting at least an ileus.  She tells me she vomited last night.  She contacted Dr. Clyda Greener office to see if he could put in a PEG.  He referred her back to [place].  I called her this morning to discuss.  I think it would be helpful for her to have a PEG in place that she can decompress her stomach if it gets full with either gas or liquid so she does not have to vomit.  She is agreeable and I will place the order today.

## 2018-02-22 ENCOUNTER — Other Ambulatory Visit: Payer: Self-pay | Admitting: Radiology

## 2018-02-22 ENCOUNTER — Ambulatory Visit: Payer: 59 | Admitting: Physical Therapy

## 2018-02-22 ENCOUNTER — Encounter: Payer: Self-pay | Admitting: Physical Therapy

## 2018-02-22 DIAGNOSIS — R262 Difficulty in walking, not elsewhere classified: Secondary | ICD-10-CM

## 2018-02-22 DIAGNOSIS — M6281 Muscle weakness (generalized): Secondary | ICD-10-CM | POA: Diagnosis not present

## 2018-02-22 NOTE — Therapy (Signed)
[place], [place], [address] Phone: [phone]   Fax:  [phone]  Physical Therapy Treatment  Patient Details  Name: Sonia Hill MRN: [id number] Date of Birth: [birth year] Referring Provider: Dr. Vinie Sill    Encounter Date: 02/22/2018  PT End of Session - 02/22/18 1213    Visit Number  10    Number of Visits  16    Date for PT Re-Evaluation  03/02/18    PT Start Time  1100    PT Stop Time  1145    PT Time Calculation (min)  45 min    Activity Tolerance  Patient tolerated treatment well    Behavior During Therapy  [facility] for tasks assessed/performed       Past Medical History:  Diagnosis Date  . Arthritis    neck, upper back, shoulders - no meds - yoga  . Chronic kidney disease    Only has right kidney  . Depression    Hx - no current problem  . GERD (gastroesophageal reflux disease)    diet controlled - no meds  . Hyperlipidemia    diet controlled  . Hypothyroidism   . lt breast ca dx'd 11/2009  . Osteoporosis, unspecified 12/06/2013  . PONV (postoperative nausea and vomiting)   . SBO (small bowel obstruction) ([place])   . SVD (spontaneous vaginal delivery)    x 1    Past Surgical History:  Procedure Laterality Date  . ABDOMINAL SURGERY    . BLADDER NECK RECONSTRUCTION     made a new urethea tube  . bladder tact  2008  . BOWEL RESECTION N/A 11/08/2017   Procedure: SMALL BOWEL RESECTION;  Surgeon: Michael Boston, MD;  Location: [facility];  Service: General;  Laterality: N/A;  . BREAST SURGERY     masectomy left breast  . coloscopy    . COLOSTOMY    . DILATION AND CURETTAGE OF UTERUS    . GASTROSTOMY N/A 11/08/2017   Procedure: INSERTION OF GASTROSTOMY TUBE;  Surgeon: Michael Boston, MD;  [facility];  Service: General;  Laterality: N/A;  . IR NEPHROSTOGRAM RIGHT THRU EXISTING ACCESS  02/08/2018  . IR NEPHROSTOMY PLACEMENT RIGHT  01/14/2018  . LAPAROSCOPY N/A 01/24/2013   Procedure:  LAPROSCOPY OPERATIVE;  Surgeon: Margarette Asal, MD;  Location: [facility];  Service: Gynecology;  Laterality: N/A;  . LAPAROSCOPY N/A 11/08/2017   Procedure: LAPAROSCOPY DIAGNOSTIC;  Surgeon: Michael Boston, MD;  [facility];  Service: General;  Laterality: N/A;  . LYSIS OF ADHESION N/A 11/08/2017   Procedure: LYSIS OF ADHESION;  Surgeon: Michael Boston, MD;  [facility];  Service: General;  Laterality: N/A;  . NEPHRECTOMY Left    left kidney dysfunction, recurrent pyelonephritis  . PARTIAL COLECTOMY N/A 11/08/2017   Procedure: PARTIAL COLECTOMY WITH NEW COLOSTOMY;  Surgeon: Michael Boston, MD;  [facility];  Service: General;  Laterality: N/A;  . SALPINGOOPHORECTOMY Bilateral 01/24/2013   Procedure: SALPINGO OOPHORECTOMY;  Surgeon: Margarette Asal, MD;  Location: [facility];  Service: Gynecology;  Laterality: Bilateral;  . WISDOM TOOTH EXTRACTION      There were no vitals filed for this visit.  Subjective Assessment - 02/22/18 1110    Subjective  Pt has had a rough couple of days with nausea the past couple of days . She plans to have a procedure tomorrow for a g-tube     Pertinent History  BRCA 1-2 negative with lobular breast cancer stage IV at presentation  July 2011 status post left mastectomy and sentinel lymph node sampling in July 2011 for a lower inner quadrant T1 N1 M1, stage IV invasive lobular breast cancer, grade 1, strongly estrogen and progesterone receptor-positive, HER2 negative with MIB-1 of 9% and no HER2 amplification, with multiple sclerotic bone lesions at presentation seen only on CT scan (not on bone scan or PET scan), but  with biopsy-proven metastatic disease to bone; and an elevated CA 27.29 at presentation, single functioning kidney, status post bilateral salpingo-oophorectomy 01/24/2013, with benign pathology osteoporosis; the patient refuses bisphosphonate therapy; started [facility] February 2014, T score -2.7 and -3., s/p colostomy, recent abdominal cellulits     Patient Stated Goals  to have more endurance     Currently in Pain?  Yes    Pain Score  3     Pain Location  Abdomen   nausea and gas pains                 [facility]patient [facility]03/[facility]  Lymphedema Life Impact Scale Total Score  60.29 %           [facility] Adult PT Treatment/Exercise - 02/22/18 0001      Manual Therapy   Manual therapy comments  thick massage on left leg to lubricate skin so allevyn dressing did not stick     Edema Management  Donned pts thigh high flat knit compression stocking after session    Manual Lymphatic Drainage (MLD)  left thigh , knee, lower leg and foot with return along pathyways                   PT Long Term Goals - 01/30/18 1250      PT LONG TERM GOAL #1   Title  Pt will receive appropriate compression garments for management of bilateral LE, genital and abdominal swelling to decrease risk of infection    Status  Achieved      PT LONG TERM GOAL #2   Title  Pt will be independent in self MLD for long term management of lymphedema    Status  Deferred      PT LONG TERM GOAL #3   Title  Pt will report a 25% improvmement in bilateral LE, swelling to allow improved comfort    Status  Deferred      PT LONG TERM GOAL #4   Title  Pt will report a 25% improvmement in bilateral LE, swelling to allow improved comfort    Status  Deferred      PT LONG TERM GOAL #5   Title  Pt will reports she has about 50% more endurance allowing her to do more activities at home.     Time  4    Period  Weeks    Status  New      Additional Long Term Goals   Additional Long Term Goals  Yes      PT LONG TERM GOAL #6   Title  Pt will impove # of sit to stand to 14 in 30 seconds indicating and improvment in funcitonal strength     Baseline  12    Time  4    Period  Weeks    Status  New      PT LONG TERM GOAL #7   Title  Pt will decrease TUG time to < 6 seconds indicating an improvment in  functional mobility and balance     Baseline  6.7 sec  Time  4    Period  Weeks    Status  New      PT LONG TERM GOAL #8   Title  Pt will be independent in a home exercise program for strength and endurance     Time  4    Period  Weeks    Status  New              Patient will benefit from skilled therapeutic intervention in order to improve the following deficits and impairments:     Visit Diagnosis: Muscle weakness (generalized)  Difficulty in walking, not elsewhere classified     Problem List Patient Active Problem List   Diagnosis Date Noted  . CKD (chronic kidney disease), stage III ([place]) 01/15/2018  . Acute lower UTI 01/14/2018  . Pelvic mass 01/14/2018  . Leukopenia due to antineoplastic chemotherapy ([place]) 01/14/2018  . SIRS (systemic inflammatory response syndrome) ([place]) 01/14/2018  . Acute kidney injury superimposed on CKD ([place]) 01/13/2018  . ARF (acute renal failure) ([employer]) 01/13/2018  . Acute kidney injury ([place])   . Gross hematuria   . Anemia 11/12/2017  . Malnutrition of moderate degree 11/11/2017  . Metastatic breast cancer to small intestine causing SBO s/p SB resection 11/08/2017 11/10/2017  . 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 Cachexia ([place]) 11/10/2017  . New onset left bundle branch block (LBBB) 11/09/2017  . Colostomy obstruction from metastasis s/p colectomy/ostomy revision 11/08/2017 11/08/2017  . 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 Ascites 11/08/2017  . Hypoglycemia 11/04/2017  . Hypokalemia 11/04/2017  . Hypocalcemia 11/04/2017  . Small bowel obstruction from metastasis s/p SB resection 11/08/2017 11/03/2017  . Obstructive nephropathy 10/24/2017  . 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 Hyponatremia 10/16/2017  . Protein-calorie malnutrition, severe 07/11/2017  . Breast cancer metastasized to multiple sites ([place]) 10/16/2015  . Genetic  testing 06/24/2015  . Osteoporosis 12/06/2013  . Cancer of lower-inner quadrant of left female breast ([place]) 03/22/2013  . Breast cancer metastasized to bone ([place]) 10/03/2012   Donato Heinz. Owens Shark PT  Norwood Levo 02/22/2018, 12:21 PM  [place], [place], [address] Phone: [phone]   Fax:  [phone]  Name: CLARY BOULAIS MRN: [id number] Date of Birth: 08/16/1955

## 2018-02-23 ENCOUNTER — Other Ambulatory Visit: Payer: Self-pay | Admitting: Hematology & Oncology

## 2018-02-23 ENCOUNTER — Encounter (HOSPITAL_COMMUNITY): Payer: Self-pay

## 2018-02-23 ENCOUNTER — Ambulatory Visit (HOSPITAL_COMMUNITY)
Admission: RE | Admit: 2018-02-23 | Discharge: 2018-02-23 | Disposition: A | Discharge status: Home / Self Care | Payer: 59 | Source: Ambulatory Visit | Attending: Oncology | Admitting: Oncology

## 2018-02-23 ENCOUNTER — Other Ambulatory Visit: Payer: Self-pay

## 2018-02-23 ENCOUNTER — Ambulatory Visit (HOSPITAL_COMMUNITY)
Admission: RE | Admit: 2018-02-23 | Discharge: 2018-02-23 | Disposition: A | Discharge status: Home / Self Care | Payer: 59 | Source: Ambulatory Visit | Attending: Family Medicine | Admitting: Family Medicine

## 2018-02-23 DIAGNOSIS — Z9049 Acquired absence of other specified parts of digestive tract: Secondary | ICD-10-CM | POA: Diagnosis not present

## 2018-02-23 DIAGNOSIS — E039 Hypothyroidism, unspecified: Secondary | ICD-10-CM | POA: Diagnosis not present

## 2018-02-23 DIAGNOSIS — K219 Gastro-esophageal reflux disease without esophagitis: Secondary | ICD-10-CM | POA: Diagnosis not present

## 2018-02-23 DIAGNOSIS — C786 Secondary malignant neoplasm of retroperitoneum and peritoneum: Secondary | ICD-10-CM | POA: Insufficient documentation

## 2018-02-23 DIAGNOSIS — Z9012 Acquired absence of left breast and nipple: Secondary | ICD-10-CM | POA: Insufficient documentation

## 2018-02-23 DIAGNOSIS — Z853 Personal history of malignant neoplasm of breast: Secondary | ICD-10-CM | POA: Diagnosis not present

## 2018-02-23 DIAGNOSIS — Z888 Allergy status to other drugs, medicaments and biological substances status: Secondary | ICD-10-CM | POA: Diagnosis not present

## 2018-02-23 DIAGNOSIS — Z905 Acquired absence of kidney: Secondary | ICD-10-CM | POA: Diagnosis not present

## 2018-02-23 DIAGNOSIS — E785 Hyperlipidemia, unspecified: Secondary | ICD-10-CM | POA: Insufficient documentation

## 2018-02-23 DIAGNOSIS — Z803 Family history of malignant neoplasm of breast: Secondary | ICD-10-CM | POA: Diagnosis not present

## 2018-02-23 DIAGNOSIS — C801 Malignant (primary) neoplasm, unspecified: Secondary | ICD-10-CM | POA: Insufficient documentation

## 2018-02-23 DIAGNOSIS — Z79899 Other long term (current) drug therapy: Secondary | ICD-10-CM | POA: Diagnosis not present

## 2018-02-23 DIAGNOSIS — Z431 Encounter for attention to gastrostomy: Secondary | ICD-10-CM | POA: Diagnosis not present

## 2018-02-23 DIAGNOSIS — Z882 Allergy status to sulfonamides status: Secondary | ICD-10-CM | POA: Diagnosis not present

## 2018-02-23 DIAGNOSIS — Z90722 Acquired absence of ovaries, bilateral: Secondary | ICD-10-CM | POA: Diagnosis not present

## 2018-02-23 DIAGNOSIS — Z9889 Other specified postprocedural states: Secondary | ICD-10-CM | POA: Insufficient documentation

## 2018-02-23 DIAGNOSIS — N189 Chronic kidney disease, unspecified: Secondary | ICD-10-CM | POA: Diagnosis not present

## 2018-02-23 DIAGNOSIS — M810 Age-related osteoporosis without current pathological fracture: Secondary | ICD-10-CM | POA: Diagnosis not present

## 2018-02-23 DIAGNOSIS — F329 Major depressive disorder, single episode, unspecified: Secondary | ICD-10-CM | POA: Diagnosis not present

## 2018-02-23 HISTORY — PX: IR GASTROSTOMY TUBE MOD SED: IMG625

## 2018-02-23 LAB — CBC
HCT: 33.2 % — ABNORMAL LOW (ref 36.0–46.0)
Hemoglobin: 11.4 g/dL — ABNORMAL LOW (ref 12.0–15.0)
MCH: 31.5 pg (ref 26.0–34.0)
MCHC: 34.3 g/dL (ref 30.0–36.0)
MCV: 91.7 fL (ref 78.0–100.0)
PLATELETS: 384 10*3/uL (ref 150–400)
RBC: 3.62 MIL/uL — ABNORMAL LOW (ref 3.87–5.11)
RDW: 13.6 % (ref 11.5–15.5)
WBC: 4 10*3/uL (ref 4.0–10.5)

## 2018-02-23 LAB — BASIC METABOLIC PANEL
Anion gap: 12 (ref 5–15)
BUN: 13 mg/dL (ref 8–23)
CALCIUM: 10.2 mg/dL (ref 8.9–10.3)
CHLORIDE: 89 mmol/L — AB (ref 98–111)
CO2: 24 mmol/L (ref 22–32)
CREATININE: 1.23 mg/dL — AB (ref 0.44–1.00)
GFR calc Af Amer: 53 mL/min — ABNORMAL LOW (ref >60)
GFR, EST NON AFRICAN AMERICAN: 46 mL/min — AB (ref >60)
Glucose, Bld: 80 mg/dL (ref 70–99)
Potassium: 4.2 mmol/L (ref 3.5–5.1)
Sodium: 125 mmol/L — ABNORMAL LOW (ref 135–145)

## 2018-02-23 LAB — PROTIME-INR
INR: 0.93
Prothrombin Time: 12.3 seconds (ref 11.4–15.2)

## 2018-02-23 MED ORDER — LIDOCAINE-EPINEPHRINE (PF) 2 %-1:200000 IJ SOLN
INTRAMUSCULAR | Status: AC | PRN
  Administered 2018-02-23: 10 mL

## 2018-02-23 MED ORDER — CEFAZOLIN SODIUM-DEXTROSE 2-4 GM/100ML-% IV SOLN
INTRAVENOUS | Status: AC
  Administered 2018-02-23: 2 g via INTRAVENOUS
  Filled 2018-02-23: qty 100

## 2018-02-23 MED ORDER — FENTANYL CITRATE (PF) 100 MCG/2ML IJ SOLN
INTRAMUSCULAR | Status: AC | PRN
  Administered 2018-02-23 (×2): 50 ug via INTRAVENOUS

## 2018-02-23 MED ORDER — GLUCAGON HCL (RDNA) 1 MG IJ SOLR
INTRAMUSCULAR | Status: AC | PRN
  Administered 2018-02-23: 1 mg via INTRAVENOUS

## 2018-02-23 MED ORDER — LIDOCAINE-EPINEPHRINE (PF) 2 %-1:200000 IJ SOLN
INTRAMUSCULAR | Status: AC
  Filled 2018-02-23: qty 20

## 2018-02-23 MED ORDER — OXYCODONE HCL 20 MG/ML PO CONC: 10.0000 mg | ORAL | 0 refills | Status: DC | PRN

## 2018-02-23 MED ORDER — IOPAMIDOL (ISOVUE-300) INJECTION 61%
INTRAVENOUS | Status: AC
  Administered 2018-02-23: 10 mL
  Filled 2018-02-23: qty 50

## 2018-02-23 MED ORDER — GLUCAGON HCL RDNA (DIAGNOSTIC) 1 MG IJ SOLR
INTRAMUSCULAR | Status: AC
  Filled 2018-02-23: qty 1

## 2018-02-23 MED ORDER — IOPAMIDOL (ISOVUE-300) INJECTION 61%
10.0000 mL | Freq: Once | INTRAVENOUS | Status: AC | PRN
  Administered 2018-02-23: 10 mL

## 2018-02-23 MED ORDER — CEFAZOLIN SODIUM-DEXTROSE 2-4 GM/100ML-% IV SOLN
2.0000 g | INTRAVENOUS | Status: AC
  Administered 2018-02-23: 2 g via INTRAVENOUS

## 2018-02-23 MED ORDER — FENTANYL CITRATE (PF) 100 MCG/2ML IJ SOLN
INTRAMUSCULAR | Status: AC
  Filled 2018-02-23: qty 4

## 2018-02-23 MED ORDER — MIDAZOLAM HCL 2 MG/2ML IJ SOLN
INTRAMUSCULAR | Status: AC
  Filled 2018-02-23: qty 4

## 2018-02-23 MED ORDER — SODIUM CHLORIDE 0.9 % IV SOLN
INTRAVENOUS | Status: DC
  Administered 2018-02-23: 08:00:00 via INTRAVENOUS

## 2018-02-23 MED ORDER — MIDAZOLAM HCL 2 MG/2ML IJ SOLN
INTRAMUSCULAR | Status: AC | PRN
  Administered 2018-02-23 (×3): 1 mg via INTRAVENOUS

## 2018-02-23 MED ORDER — OXYCODONE HCL 20 MG/ML PO CONC: 10.0000 mg | ORAL | 0 refills | Status: AC | PRN

## 2018-02-23 NOTE — H&P (Signed)
Chief Complaint: Patient was seen in consultation today for abdominal carcinomatosis  Referring Physician(s): Chauncey Cruel  Supervising Physician: Sandi Mariscal  Patient Status: [facility] - Out[facility]  History of Present Illness: Sonia Hill is a 62 y.o. female with past medical history of CKD, HLD, SBO, and breast cancer now widely metastatic with abdominal carcinomatosis.  Patient with recent ileus/SBO for which she underwent G-tube placement with surgery in June 2019.  This was removed in July 2019 due to improvement in symptoms and per patient request.  She now has recurrent symptoms of ileus with vomiting and would like gastrostomy tube replaced.  Of note, she has had at least 1 paracentesis in the past for abdominal ascites.  Her most recent US ABdomen 8/4 showed little to no fluid.   She presents today in her usual state of health.  She did recently grow yeast in her urine (cultured from nephrostomy tube) and is on diflucan. Otherwise, she has nausea/vomiting but no signs of acute illness.  She has been NPO today.  She does not take blood thinners.   Past Medical History:  Diagnosis Date  . Arthritis    neck, upper back, shoulders - no meds - yoga  . Chronic kidney disease    Only has right kidney  . Depression    Hx - no current problem  . GERD (gastroesophageal reflux disease)    diet controlled - no meds  . Hyperlipidemia    diet controlled  . Hypothyroidism   . lt breast ca dx'd 11/2009  . Osteoporosis, unspecified 12/06/2013  . PONV (postoperative nausea and vomiting)   . SBO (small bowel obstruction) ([place])   . SVD (spontaneous vaginal delivery)    x 1    Past Surgical History:  Procedure Laterality Date  . ABDOMINAL SURGERY    . BLADDER NECK RECONSTRUCTION     made a new urethea tube  . bladder tact  2008  . BOWEL RESECTION N/A 11/08/2017   Procedure: SMALL BOWEL RESECTION;  Surgeon: Michael Boston, MD;  [facility];  Service: General;  Laterality: N/A;    . BREAST SURGERY     masectomy left breast  . coloscopy    . COLOSTOMY    . DILATION AND CURETTAGE OF UTERUS    . GASTROSTOMY N/A 11/08/2017   Procedure: INSERTION OF GASTROSTOMY TUBE;  Surgeon: Michael Boston, MD;  [facility];  Service: General;  Laterality: N/A;  . IR NEPHROSTOGRAM RIGHT THRU EXISTING ACCESS  02/08/2018  . IR NEPHROSTOMY PLACEMENT RIGHT  01/14/2018  . LAPAROSCOPY N/A 01/24/2013   Procedure: LAPROSCOPY OPERATIVE;  Surgeon: Margarette Asal, MD;  Location: [facility];  Service: Gynecology;  Laterality: N/A;  . LAPAROSCOPY N/A 11/08/2017   Procedure: LAPAROSCOPY DIAGNOSTIC;  Surgeon: Michael Boston, MD;  [facility];  Service: General;  Laterality: N/A;  . LYSIS OF ADHESION N/A 11/08/2017   Procedure: LYSIS OF ADHESION;  Surgeon: Michael Boston, MD;  [facility];  Service: General;  Laterality: N/A;  . NEPHRECTOMY Left    left kidney dysfunction, recurrent pyelonephritis  . PARTIAL COLECTOMY N/A 11/08/2017   Procedure: PARTIAL COLECTOMY WITH NEW COLOSTOMY;  Surgeon: Michael Boston, MD;  [facility];  Service: General;  Laterality: N/A;  . SALPINGOOPHORECTOMY Bilateral 01/24/2013   Procedure: SALPINGO OOPHORECTOMY;  Surgeon: Margarette Asal, MD;  Location: [facility];  Service: Gynecology;  Laterality: Bilateral;  . WISDOM TOOTH EXTRACTION      Allergies: Sulfa antibiotics; Adhesive [tape]; Betadine [povidone iodine];  and Mercury  Medications: Prior to Admission medications   Medication Sig Start Date End Date Taking? Authorizing Provider  abemaciclib (VERZENIO) 100 MG tablet Take 1 tablet (100 mg total) by mouth 2 (two) times daily. Swallow tablets whole. Do not chew, crush, or split tablets before swallowing. 01/23/18  Yes Charlynne Cousins, MD  ALPRAZolam Duanne Moron) 0.25 MG tablet TAKE 1 TABLET BY MOUTH EVERY 8 HOURS AS NEEDED FOR ANXIETY 02/09/18  Yes Magrinat, Virgie Dad, MD  CALCIUM PO Take 2 tablets by mouth 2 (two) times daily.   Yes [provider]   cetirizine (ZYRTEC) 10 MG tablet Take 10 mg by mouth daily.   Yes [provider]  cholecalciferol (D-VI-SOL) 400 UNIT/ML LIQD Take 400 Units by mouth daily.   Yes [provider]  Digestive Enzymes (DIGESTIVE ENZYME PO) Take 1 tablet by mouth daily.   Yes [provider]  exemestane (AROMASIN) 25 MG tablet Take 1 tablet (25 mg total) by mouth daily after breakfast. 07/05/17  Yes Magrinat, Virgie Dad, MD  gabapentin (NEURONTIN) 300 MG capsule Take 1 capsule (300 mg total) by mouth 3 (three) times daily as needed. Patient taking differently: Take 300 mg by mouth 3 (three) times daily as needed (nerve pain).  06/28/17  Yes Magrinat, Virgie Dad, MD  Multiple Vitamin (MULTIVITAMIN WITH MINERALS) TABS tablet Take 1 tablet by mouth daily.   Yes [provider]  Omega-3 Fatty Acids (FISH OIL PO) Take 5 mLs by mouth daily.   Yes [provider]  ondansetron (ZOFRAN-ODT) 8 MG disintegrating tablet Take 1 tablet (8 mg total) by mouth every 8 (eight) hours as needed for nausea or vomiting. 06/22/17  Yes Magrinat, Virgie Dad, MD  Probiotic Product (PROBIOTIC DAILY PO) Take 1 tablet by mouth daily.   Yes [provider]  simethicone (MYLICON) 220 MG chewable tablet Chew 125 mg by mouth every 2 (two) hours as needed for flatulence.    Yes [provider]  Thyroid (NATURE-THROID) 81.25 MG TABS Take 81.25 mg by mouth daily.   Yes [provider]  D-MANNOSE PO Take 1 tablet by mouth daily.    [provider]  fluconazole (DIFLUCAN) 100 MG tablet Take 1 tablet (100 mg total) by mouth daily. 02/15/18   Magrinat, Virgie Dad, MD  furosemide (LASIX) 40 MG tablet Take 1 tablet (40 mg total) by mouth daily. 11/29/17 01/13/18  Magrinat, Virgie Dad, MD  heparin lock flush 100 UNIT/ML SOLN injection Inject 2.5 mLs (250 Units total) into the vein daily for 30 doses. 11/24/17 12/24/17  Alma Friendly, MD     Family History  Problem Relation Age of Onset  .  Breast cancer Mother 61  . Breast cancer Maternal Aunt 58  . Breast cancer Other        MGF's sister dx with breast cancer in her 23s    Social History   Socioeconomic History  . Marital status: Married    Spouse name: Not on file  . Number of children: Not on file  . Years of education: Not on file  . Highest education level: Not on file  Occupational History  . Not on file  Social Needs  . Financial resource strain: Not on file  . Food insecurity:    Worry: Not on file    Inability: Not on file  . Transportation needs:    Medical: Not on file    Non-medical: Not on file  Tobacco Use  . Smoking status: Never Smoker  .  Smokeless tobacco: Never Used  Substance and Sexual Activity  . Alcohol use: No  . Drug use: No  . Sexual activity: Yes    Birth control/protection: Post-menopausal  Lifestyle  . Physical activity:    Days per week: Not on file    Minutes per session: Not on file  . Stress: Not on file  Relationships  . Social connections:    Talks on phone: Not on file    Gets together: Not on file    Attends religious service: Not on file    Active member of club or organization: Not on file    Attends meetings of clubs or organizations: Not on file    Relationship status: Not on file  Other Topics Concern  . Not on file  Social History Narrative  . Not on file    Review of Systems: A 12 point ROS discussed and pertinent positives are indicated in the HPI above.  All other systems are negative.  Review of Systems  Constitutional: Positive for appetite change and fatigue. Negative for fever.  Respiratory: Negative for cough and shortness of breath.   Cardiovascular: Negative for chest pain.  Gastrointestinal: Positive for abdominal pain, nausea and vomiting.  Genitourinary: Negative for dysuria, frequency and urgency.  Musculoskeletal: Negative for back pain.  Psychiatric/Behavioral: Negative for behavioral problems and confusion.    Vital Signs: BP  127/70   Pulse 88   Temp 98.1 F (36.7 C) (Oral)   Resp 16   SpO2 99%   Physical Exam  Constitutional: She is oriented to person, place, and time. She appears well-developed. No distress.  Cardiovascular: Normal rate, regular rhythm and normal heart sounds. Exam reveals no gallop and no friction rub.  No murmur heard. Pulmonary/Chest: Effort normal and breath sounds normal. No respiratory distress.  Abdominal: Soft. She exhibits no distension. There is no tenderness.  Scar present from recent gastrostomy tube placement.  Neurological: She is alert and oriented to person, place, and time.  Skin: Skin is warm and dry. She is not diaphoretic.  Psychiatric: She has a normal mood and affect. Her behavior is normal. Judgment and thought content normal.  Nursing note and vitals reviewed.    MD Evaluation Airway: WNL Heart: WNL Abdomen: WNL Chest/ Lungs: WNL ASA  Classification: 3 Mallampati/Airway Score: One   Imaging: Ct Abdomen Pelvis Wo Contrast  Result Date: 02/14/2018 CLINICAL DATA:  Stage IV breast cancer. Abdominal pain. Progressive left lower extremity swelling. EXAM: CT ABDOMEN AND PELVIS WITHOUT CONTRAST TECHNIQUE: Multidetector CT imaging of the abdomen and pelvis was performed following the standard protocol without IV contrast. COMPARISON:  01/13/2018 CT abdomen/pelvis. FINDINGS: Lower chest: Trace dependent bilateral pleural effusions. Subpleural 3 mm basilar right lower lobe pulmonary nodule (series 9/image 7), not appreciably changed. Hepatobiliary: Normal liver size. No liver mass. Normal gallbladder with no radiopaque cholelithiasis. No biliary ductal dilatation. Pancreas: Normal, with no mass or duct dilation. Spleen: Normal size. No mass. Adrenals/Urinary Tract: No discrete adrenal nodules. Left nephrectomy. Well-positioned right percutaneous nephrostomy tube with distal pigtail portion in the central right renal collecting system. No right hydronephrosis. No right renal  stones. No contour deforming right renal mass. Bladder is nearly collapsed. Previously described infiltrative deep pelvic soft tissue mass along the posterior bladder wall measures approximately 7.8 x 4.2 cm, previously 7.8 x 4.1 cm using similar measurement technique, not appreciably changed. Stomach/Bowel: Stomach mildly distended with air-fluid level. There are multiple newly mildly dilated small bowel loops with air-fluid levels throughout the abdomen  measuring up to 4.0 cm diameter in the left abdomen (series 2/image 30). No discrete small bowel caliber transition. Status post partial distal colectomy with end colostomy in the ventral left abdominal wall. Mild-to-moderate stool in right and remnant transverse colon without acute wall thickening in this portion of the colon. Stable marked circumferential wall thickening in the remnant sigmoid colon and rectum. Vascular/Lymphatic: Atherosclerotic nonaneurysmal abdominal aorta. Poorly defined enlarged 1.6 cm left para-aortic node (series 2/image 36), previously 1.4 cm, slightly increased. Stable mildly enlarged 1.3 cm left inguinal node (series 2/image 69). Reproductive: No discrete adnexal mass. Other: No pneumoperitoneum. Small to moderate volume ascites, slightly increased. Irregular diffuse peritoneal thickening throughout the abdomen and pelvis, which appears slightly increased, for example in the right paracolic gutter measuring up to 0.5 cm thickness, previously 0.3 cm. Musculoskeletal: Stable mixed lytic and sclerotic lesions in T12, L1 and L3 vertebral bodies. No new focal osseous lesions. Mild thoracolumbar spondylosis. Moderate anasarca, worsened. IMPRESSION: 1. Study significantly limited by absence of oral and IV contrast. 2. Well-positioned right percutaneous nephrostomy tube. No right hydronephrosis. 3. Multiple newly mildly dilated small bowel loops with air-fluid levels throughout the abdomen, without discrete small bowel caliber transition.  Findings suggest mild mechanical mid to distal small bowel obstruction versus adynamic ileus. 4. Mild retroperitoneal adenopathy is mildly increased. Left inguinal adenopathy is stable. 5. Irregular diffuse peritoneal thickening throughout the abdomen and pelvis, slightly worsened, compatible with peritoneal carcinomatosis. Infiltrative deep pelvic soft tissue mass and marked circumferential wall thickening in the remnant sigmoid colon and rectum are unchanged and compatible with peritoneal malignancy. Small to moderate volume ascites, slightly increased. 6. Trace dependent bilateral pleural effusions. 7. Scattered mixed lytic and sclerotic osseous lesions in the spine are stable. 8. Moderate anasarca, worsened. 9.  Aortic Atherosclerosis (ICD10-I70.0). These results will be called to the ordering clinician or representative by the Radiology Department at the imaging location. Electronically Signed   By: Ilona Sorrel M.D.   On: 02/14/2018 14:10   Ir Nephrostogram Right Thru Existing Access  Result Date: 02/08/2018 INDICATION: Leaking nephrostomy 2 EXAM: RIGHT NEPHROSTOGRAM COMPARISON:  None. MEDICATIONS: None ANESTHESIA/SEDATION: None CONTRAST:  5 cc Isovue-300-administered into the collecting system(s) FLUOROSCOPY TIME:  Fluoroscopy Time:  minutes 18 seconds (1.7 mGy). COMPLICATIONS: None immediate. PROCEDURE: Contrast was injected into the right nephrostomy tube and imaging was obtained. FINDINGS: The nephrostomy tube is coiled in the right renal pelvis. There is no extravasation of contrast. It is widely patent. IMPRESSION: Right nephrostomy catheter is functioning adequately. Electronically Signed   By: Marybelle Killings M.D.   On: 02/08/2018 17:23    Labs:  CBC: Recent Labs    01/15/18 0447 01/22/18 1222 02/13/18 1541 02/23/18 0727  WBC 3.9* 4.0 5.1 4.0  HGB 8.4* 8.5* 11.0* 11.4*  HCT 24.9* 25.7* 33.2* 33.2*  PLT 350 402* 288 384    COAGS: Recent Labs    01/14/18 1056 02/23/18 0727  INR  1.01 0.93    BMP: Recent Labs    01/16/18 0516 01/22/18 1222 02/13/18 1541 02/23/18 0727  NA 138 135 134* 125*  K 3.2* 4.4 4.9 4.2  CL 99 97* 97* 89*  CO2 27 26 27 24   GLUCOSE 85 101* 89 80  BUN 36* 25* 23 13  CALCIUM 8.8* 9.8 10.7* 10.2  CREATININE 2.25* 1.24* 1.46* 1.23*  GFRNONAA 22* 45* 37* 46*  GFRAA 26* 52* 43* 53*    LIVER FUNCTION TESTS: Recent Labs    11/22/17 0818 12/19/17 1201 01/22/18 1222 02/13/18  1541  BILITOT 0.7 0.3 0.3 0.4  AST 39 22 23 41  ALT 34 10 7 7   ALKPHOS 247* 99 69 75  PROT 6.2* 7.0 7.5 7.7  ALBUMIN 2.9* 3.4* 3.7 3.6    TUMOR MARKERS: No results for input(s): AFPTM, CEA, CA199, CHROMGRNA in the last 8760 hours.  Assessment and Plan: Patient with past medical history of metastatic breast cancer presents with complaint of abdominal carcinomatosis, nausea, vomiting.  IR consulted for gastrostomy tube placement at the request of Dr. [name]. Case reviewed by Dr. Pascal Lux who approves patient for procedure. Extensive conversation held with patient and her husband regarding potential complications related to tube with her abdominal anatomy and disease.  They wish to move forward. Patient presents today in stable condition.  She does have nausea/vomiting.   She has been NPO and is not currently on blood thinners.   Risks and benefits discussed with the patient including, but not limited to the need for a barium enema during the procedure, bleeding, infection, peritonitis, or damage to adjacent structures.  All of the patient's questions were answered, patient is agreeable to proceed. Consent signed and in chart.  Thank you for this interesting consult.  I greatly enjoyed meeting Sonia Hill and look forward to participating in their care.  A copy of this report was sent to the requesting provider on this date.  Electronically Signed: Docia Barrier, PA 02/23/2018, 8:35 AM   I spent a total of  30 Minutes   in face to face in  clinical consultation, greater than 50% of which was counseling/coordinating care for abdominal carcinomatosis.

## 2018-02-23 NOTE — Discharge Instructions (Signed)
Please follow-up with Dr. Ron Agee about care of your PEG tube once you get home. The office will then get your in touch with home health to get you supplies for the care of your tube.   Per Dr. Pascal Lux, please stay on a liquid diet until 02/24/18 then you may move to solids   Gastrostomy Tube Home Guide, Adult  A gastrostomy tube is a tube that is surgically placed into the stomach. It is also called a G-tube. The tube is inserted into the stomach through a small cut (incision) in the skin. In your case, your tube will be used to decompress your stomach when you have the urge to vomit.   Gastrostomy tube care  Wash your hands with soap and water.  Remove the old dressing (if any). Some styles of G-tubes may need a dressing inserted between the skin and the G-tube. Other types of G-tubes do not require a dressing. Ask your health care provider if a dressing is needed.  Check the area where the tube enters the skin (insertion site) for redness, swelling, or pus-like (purulent) drainage. A small amount of clear or tan liquid drainage is normal. Check to make sure scar tissue (skin) is not growing around the insertion site. This could have a raised, bumpy appearance.  A cotton swab can be used to clean the skin around the tube: ? When the G-tube is first put in, a normal saline solution or water can be used to clean the skin. ? Mild soap and warm water can be used when the skin around the G-tube site has healed. ? Roll the cotton swab around the G-tube insertion site to remove any drainage or crusting at the insertion site. Stomach residuals Feeding tube residuals are the amount of liquids that are in the stomach at any given time. Residuals may be checked before giving feedings, medications, or as instructed by your health care provider.  Ask your health care provider if there are instances when you would not start tube feedings depending on the amount or type of contents withdrawn from the  stomach.  Check residuals by attaching a syringe to the G-tube and pulling back on the syringe plunger. Note the amount, and return the residual back into the stomach.  Flushing the G-tube  The G-tube should be periodically flushed with clean warm water to keep it from clogging. ? Flush the G-tube after feedings or medications. Draw up 30 mL of warm water in a syringe. Connect the syringe to the G-tube and slowly push the water into the tube. ? Do not push feedings, medications, or flushes rapidly. Flush the G-tube gently and slowly. ? Only use syringes made for G-tubes to flush medications or feedings. ? Your health care provider may want the G-tube flushed more often or with more water. If this is the case, follow your health care provider's instructions. G-tube problems G-tube was pulled out.  Cause: May have been pulled out accidentally.  Solutions: Cover the opening with clean dressing and tape. Call your health care provider right away. The G-tube should be put in as soon as possible (within 4 hours) so the G-tube opening (tract) does not close. The G-tube needs to be put in at a health care setting. An X-ray needs to be done to confirm placement before the G-tube can be used again.  Redness, irritation, soreness, or foul odor around the gastrostomy site.  Cause: May be caused by leakage or infection.  Solutions: Call your health care provider  right away.  Large amount of leakage of fluid or mucus-like liquid present (a large amount means it soaks clothing).  Cause: Many reasons could cause the G-tube to leak.  Solutions: Call your health care provider to discuss the amount of leakage.  Skin or scar tissue appears to be growing where tube enters skin.  Cause: Tissue growth may develop around the insertion site if the G-tube is moved or pulled on excessively.  Solutions: Secure tube with tape so that excess movement does not occur. Call your health care provider.  G-tube is  clogged.  Cause: Thick formula or medication.  Solutions: Try to slowly push warm water into the tube with a large syringe. Never try to push any object into the tube to unclog it. Do not force fluid into the G-tube. If you are unable to unclog the tube, call your health care provider right away.  Tips  Head of bed (HOB) position refers to the upright position of a person's upper body. ? When giving medications or a feeding bolus, keep the [facility] up as told by your health care provider. Do this during the feeding and for 1 hour after the feeding or medication administration. ? If continuous feedings are being given, it is best to keep the [facility] up as told by your health care provider. When ADLs (activities of daily living) are performed and the [facility] needs to be flat, be sure to turn the feeding pump off. Restart the feeding pump when the [facility] is returned to the recommended height.  Do not pull or put tension on the tube.  To prevent fluid backflow, kink the G-tube before removing the cap or disconnecting a syringe.  Check the G-tube length every day. Measure from the insertion site to the end of the G-tube. If the length is longer than previous measurements, the tube may be coming out. Call your health care provider if you notice increasing G-tube length.  Oral care, such as brushing teeth, must be continued.  You may need to remove excess air (vent) from the G-tube. Your health care provider will tell you if this is needed.  Always call your health care provider if you have questions or problems with the G-tube. Get help right away if:  You have severe abdominal pain, tenderness, or abdominal bloating (distension).  You have nausea or vomiting.  You are constipated or have problems moving your bowels.  The G-tube insertion site is red, swollen, has a foul smell, or has yellow or brown drainage.  You have difficulty breathing or shortness of breath.  You have a fever.  You have a large  amount of feeding tube residuals.  The G-tube is clogged and cannot be flushed. This information is not intended to replace advice given to you by your health care provider. Make sure you discuss any questions you have with your health care provider. Document Released: 08/08/2001 Document Revised: 11/05/2015 Document Reviewed: 02/04/2013 Elsevier Interactive Patient Education  2017 [place].     Moderate Conscious Sedation, Adult, Care After These instructions provide you with information about caring for yourself after your procedure. Your health care provider may also give you more specific instructions. Your treatment has been planned according to current medical practices, but problems sometimes occur. Call your health care provider if you have any problems or questions after your procedure. What can I expect after the procedure? After your procedure, it is common:  To feel sleepy for several hours.  To feel clumsy and have  poor balance for several hours.  To have poor judgment for several hours.  To vomit if you eat too soon.  Follow these instructions at home: For at least 24 hours after the procedure:   Do not: ? Participate in activities where you could fall or become injured. ? Drive. ? Use heavy machinery. ? Drink alcohol. ? Take sleeping pills or medicines that cause drowsiness. ? Make important decisions or sign legal documents. ? Take care of children on your own.  Rest. Eating and drinking  Follow the diet recommended by your health care provider.  If you vomit: ? Drink water, juice, or soup when you can drink without vomiting. ? Make sure you have little or no nausea before eating solid foods. General instructions  Have a responsible adult stay with you until you are awake and alert.  Take over-the-counter and prescription medicines only as told by your health care provider.  If you smoke, do not smoke without supervision.  Keep all follow-up  visits as told by your health care provider. This is important. Contact a health care provider if:  You keep feeling nauseous or you keep vomiting.  You feel light-headed.  You develop a rash.  You have a fever. Get help right away if:  You have trouble breathing. This information is not intended to replace advice given to you by your health care provider. Make sure you discuss any questions you have with your health care provider. Document Released: 03/20/2013 Document Revised: 11/02/2015 Document Reviewed: 09/19/2015 Elsevier Interactive Patient Education  [employer].

## 2018-02-23 NOTE — Progress Notes (Signed)
Pt had no problems with peg tube upon discharge. Pt was hooked to suction until discharge. She tolerated fluids the last hour while here.

## 2018-02-26 ENCOUNTER — Other Ambulatory Visit: Payer: Self-pay | Admitting: Adult Health

## 2018-02-26 ENCOUNTER — Other Ambulatory Visit: Payer: Self-pay | Admitting: *Deleted

## 2018-02-26 ENCOUNTER — Inpatient Hospital Stay: Payer: 59

## 2018-02-26 ENCOUNTER — Telehealth: Payer: Self-pay | Admitting: *Deleted

## 2018-02-26 DIAGNOSIS — R319 Hematuria, unspecified: Secondary | ICD-10-CM

## 2018-02-26 DIAGNOSIS — E440 Moderate protein-calorie malnutrition: Secondary | ICD-10-CM

## 2018-02-26 DIAGNOSIS — C50312 Malignant neoplasm of lower-inner quadrant of left female breast: Secondary | ICD-10-CM | POA: Diagnosis not present

## 2018-02-26 DIAGNOSIS — K56609 Unspecified intestinal obstruction, unspecified as to partial versus complete obstruction: Secondary | ICD-10-CM

## 2018-02-26 DIAGNOSIS — M818 Other osteoporosis without current pathological fracture: Secondary | ICD-10-CM

## 2018-02-26 DIAGNOSIS — C50912 Malignant neoplasm of unspecified site of left female breast: Secondary | ICD-10-CM

## 2018-02-26 DIAGNOSIS — M810 Age-related osteoporosis without current pathological fracture: Secondary | ICD-10-CM

## 2018-02-26 DIAGNOSIS — Z170 Estrogen receptor positive status [ER+]: Secondary | ICD-10-CM

## 2018-02-26 DIAGNOSIS — C7951 Secondary malignant neoplasm of bone: Principal | ICD-10-CM

## 2018-02-26 LAB — URINALYSIS, COMPLETE (UACMP) WITH MICROSCOPIC
BILIRUBIN URINE: NEGATIVE
GLUCOSE, UA: NEGATIVE mg/dL
KETONES UR: 5 mg/dL — AB
Nitrite: NEGATIVE
PROTEIN: 100 mg/dL — AB
Specific Gravity, Urine: 1.02 (ref 1.005–1.030)
pH: 6 (ref 5.0–8.0)

## 2018-02-26 MED ORDER — CIPROFLOXACIN HCL 250 MG PO TABS: 250.0000 mg | ORAL_TABLET | Freq: Two times a day (BID) | ORAL | 0 refills | Status: DC

## 2018-02-26 NOTE — Progress Notes (Signed)
Patient to start cipro BID, 250mg  po bid, calculated based on creatinine clearance of 29 from creatinine on 02/13/18 and using up to date guidelines for dosing.  Wilber Bihari, NP

## 2018-02-26 NOTE — Telephone Encounter (Signed)
This RN returned call to [place] per VM stating onset of blood in urine.  Of note pt has urostomy with urine reddened - and noted blood in urostomy tube.  Sonia Hill has not taken her temp but " I do not feel like I have a U/A "  She does states she is having pubic pain as well as " discomfort in my kidney area ".  She had issues last week and U/A was obtained showing growth of yeast- MD ordering diflucan.  Sonia Hill took as prescribed.  Sonia Hill had labs on 02/23/2018 prior to G tube placement  Plan at present is for Sonia Hill to come in for lab for evaluation of urine.  Appointment made and orders entered.

## 2018-02-26 NOTE — Telephone Encounter (Signed)
Per review of U/A - order obtained for antibiotics.

## 2018-02-27 ENCOUNTER — Telehealth: Payer: Self-pay

## 2018-02-27 ENCOUNTER — Encounter: Payer: Self-pay | Admitting: Physical Therapy

## 2018-02-27 ENCOUNTER — Ambulatory Visit: Payer: 59 | Admitting: Physical Therapy

## 2018-02-27 DIAGNOSIS — M6281 Muscle weakness (generalized): Secondary | ICD-10-CM

## 2018-02-27 DIAGNOSIS — I890 Lymphedema, not elsewhere classified: Secondary | ICD-10-CM

## 2018-02-27 DIAGNOSIS — R262 Difficulty in walking, not elsewhere classified: Secondary | ICD-10-CM

## 2018-02-27 NOTE — Therapy (Signed)
[place], [place], [address] Phone: [phone]   Fax:  [phone]  Physical Therapy Treatment  Patient Details  Name: Sonia Hill MRN: [id number] Date of Birth: 1956-05-20 Referring Provider: Dr. Vinie Sill    Encounter Date: 02/27/2018  PT End of Session - 02/27/18 1213    Visit Number  11    Number of Visits  16    Date for PT Re-Evaluation  03/02/18    PT Start Time  1110    PT Stop Time  1150    PT Time Calculation (min)  40 min    Activity Tolerance  Patient tolerated treatment well    Behavior During Therapy  [facility] for tasks assessed/performed       Past Medical History:  Diagnosis Date  . Arthritis    neck, upper back, shoulders - no meds - yoga  . Chronic kidney disease    Only has right kidney  . Depression    Hx - no current problem  . GERD (gastroesophageal reflux disease)    diet controlled - no meds  . Hyperlipidemia    diet controlled  . Hypothyroidism   . lt breast ca dx'd 11/2009  . Osteoporosis, unspecified 12/06/2013  . PONV (postoperative nausea and vomiting)   . SBO (small bowel obstruction) ([place])   . SVD (spontaneous vaginal delivery)    x 1    Past Surgical History:  Procedure Laterality Date  . ABDOMINAL SURGERY    . BLADDER NECK RECONSTRUCTION     made a new urethea tube  . bladder tact  2008  . BOWEL RESECTION N/A 11/08/2017   Procedure: SMALL BOWEL RESECTION;  Surgeon: Michael Boston, MD;  Location: [facility];  Service: General;  Laterality: N/A;  . BREAST SURGERY     masectomy left breast  . coloscopy    . COLOSTOMY    . DILATION AND CURETTAGE OF UTERUS    . GASTROSTOMY N/A 11/08/2017   Procedure: INSERTION OF GASTROSTOMY TUBE;  Surgeon: Michael Boston, MD;  Location: [facility];  Service: General;  Laterality: N/A;  . IR GASTROSTOMY TUBE MOD SED  02/23/2018  . IR NEPHROSTOGRAM RIGHT THRU EXISTING ACCESS  02/08/2018  . IR NEPHROSTOMY PLACEMENT RIGHT  01/14/2018  .  LAPAROSCOPY N/A 01/24/2013   Procedure: LAPROSCOPY OPERATIVE;  Surgeon: Margarette Asal, MD;  Location: [facility];  Service: Gynecology;  Laterality: N/A;  . LAPAROSCOPY N/A 11/08/2017   Procedure: LAPAROSCOPY DIAGNOSTIC;  Surgeon: Michael Boston, MD;  Location: [facility];  Service: General;  Laterality: N/A;  . LYSIS OF ADHESION N/A 11/08/2017   Procedure: LYSIS OF ADHESION;  Surgeon: Michael Boston, MD;  Location: [facility];  Service: General;  Laterality: N/A;  . NEPHRECTOMY Left    left kidney dysfunction, recurrent pyelonephritis  . PARTIAL COLECTOMY N/A 11/08/2017   Procedure: PARTIAL COLECTOMY WITH NEW COLOSTOMY;  Surgeon: Michael Boston, MD;  Location: [facility];  Service: General;  Laterality: N/A;  . SALPINGOOPHORECTOMY Bilateral 01/24/2013   Procedure: SALPINGO OOPHORECTOMY;  Surgeon: Margarette Asal, MD;  Location: [facility];  Service: Gynecology;  Laterality: Bilateral;  . WISDOM TOOTH EXTRACTION      There were no vitals filed for this visit.  Subjective Assessment - 02/27/18 1115    Subjective  Pt had a PEG tube placed last week and that seems to help with her nausea.  She is on an antibiotic now as she has blood in her urine.  She is having nausea  today and arrives late for treatment     Patient is accompained by:  Family member   sister, [name]   Pertinent History  BRCA 1-2 negative with lobular breast cancer stage IV at presentation July 2011 status post left mastectomy and sentinel lymph node sampling in July 2011 for a lower inner quadrant T1 N1 M1, stage IV invasive lobular breast cancer, grade 1, strongly estrogen and progesterone receptor-positive, HER2 negative with MIB-1 of 9% and no HER2 amplification, with multiple sclerotic bone lesions at presentation seen only on CT scan (not on bone scan or PET scan), but  with biopsy-proven metastatic disease to bone; and an elevated CA 27.29 at presentation, single functioning kidney, status post bilateral salpingo-oophorectomy 01/24/2013, with benign  pathology osteoporosis; the patient refuses bisphosphonate therapy; started [facility] February 2014, T score -2.7 and -3., s/p colostomy, recent abdominal cellulits    Patient Stated Goals  to have more endurance     Currently in Pain?  Yes    Pain Location  Abdomen    Pain Orientation  Posterior;Anterior                  Outpatient [facility]  Lymphedema Life Impact Scale Total Score  60.29 %           [facility] Adult PT Treatment/Exercise - 02/27/18 0001      Manual Therapy   Edema Management  added thick massage cream to dry skin on both lower legs with gentle PROM and elevation for comfort     Manual Lymphatic Drainage (MLD)  left thigh , knee, lower leg and foot with return along pathyways                   PT Long Term Goals - 01/30/18 1250      PT LONG TERM GOAL #1   Title  Pt will receive appropriate compression garments for management of bilateral LE, genital and abdominal swelling to decrease risk of infection    Status  Achieved      PT LONG TERM GOAL #2   Title  Pt will be independent in self MLD for long term management of lymphedema    Status  Deferred      PT LONG TERM GOAL #3   Title  Pt will report a 25% improvmement in bilateral LE, swelling to allow improved comfort    Status  Deferred      PT LONG TERM GOAL #4   Title  Pt will report a 25% improvmement in bilateral LE, swelling to allow improved comfort    Status  Deferred      PT LONG TERM GOAL #5   Title  Pt will reports she has about 50% more endurance allowing her to do more activities at home.     Time  4    Period  Weeks    Status  New      Additional Long Term Goals   Additional Long Term Goals  Yes      PT LONG TERM GOAL #6   Title  Pt will impove # of sit to stand to 14 in 30 seconds indicating and improvment in funcitonal strength     Baseline  12    Time  4    Period  Weeks    Status  New      PT LONG  TERM GOAL #7   Title  Pt will decrease TUG time to < 6  seconds indicating an improvment in functional mobility and balance     Baseline  6.7 sec    Time  4    Period  Weeks    Status  New      PT LONG TERM GOAL #8   Title  Pt will be independent in a home exercise program for strength and endurance     Time  4    Period  Weeks    Status  New            Plan - 02/27/18 1214    Clinical Impression Statement  Pt continues to have much discomfort and nausea.  She gets some relief from the MLD and is able to relax during treatment.  She felt better when she left.     Clinical Impairments Affecting Rehab Potential  1 kidney functioning, metastatic disease, osteoporosis, genital, abdominal and bilateral LE swelling    PT Frequency  2x / week    PT Duration  3 weeks    PT Treatment/Interventions  ADLs/Self Care Home Management;Therapeutic activities;Therapeutic exercise;Patient/family education;Manual lymph drainage;Manual techniques;Compression bandaging;Passive range of motion;Vasopneumatic Device;Taping;Orthotic Fit/Training    PT Next Visit Plan  Remeasure for recert due 1/65 and progress with exercise or lymphedema treatment as indicated. Assess wear of flat knit and self MLD.     Consulted and Agree with Plan of Care  Patient       Patient will benefit from skilled therapeutic intervention in order to improve the following deficits and impairments:  Increased edema, Difficulty walking, Decreased knowledge of precautions, Decreased endurance, Decreased strength, Pain, Decreased activity tolerance, Impaired perceived functional ability, Decreased mobility  Visit Diagnosis: Muscle weakness (generalized)  Difficulty in walking, not elsewhere classified  Lymphedema, not elsewhere classified     Problem List Patient Active Problem List   Diagnosis Date Noted  . CKD (chronic kidney disease), stage III ([place]) 01/15/2018  . Acute lower UTI 01/14/2018  . Pelvic mass 01/14/2018  .  Leukopenia due to antineoplastic chemotherapy ([place]) 01/14/2018  . SIRS (systemic inflammatory response syndrome) ([place]) 01/14/2018  . Acute kidney injury superimposed on CKD ([place]) 01/13/2018  . ARF (acute renal failure) ([place]) 01/13/2018  . Acute kidney injury ([place])   . Gross hematuria   . Anemia 11/12/2017  . Malnutrition of moderate degree 11/11/2017  . Metastatic breast cancer to small intestine causing SBO s/p SB resection 11/08/2017 11/10/2017  . 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 Cachexia ([place]) 11/10/2017  . New onset left bundle branch block (LBBB) 11/09/2017  . Colostomy obstruction from metastasis s/p colectomy/ostomy revision 11/08/2017 11/08/2017  . 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 Ascites 11/08/2017  . Hypoglycemia 11/04/2017  . Hypokalemia 11/04/2017  . Hypocalcemia 11/04/2017  . Small bowel obstruction from metastasis s/p SB resection 11/08/2017 11/03/2017  . Obstructive nephropathy 10/24/2017  . 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 Hyponatremia 10/16/2017  . Protein-calorie malnutrition, severe 07/11/2017  . Breast cancer metastasized to multiple sites ([place]) 10/16/2015  . Genetic testing 06/24/2015  . Osteoporosis 12/06/2013  . Cancer of lower-inner quadrant of left female breast ([place]) 03/22/2013  . Breast cancer metastasized to bone ([place]) 10/03/2012  Donato Heinz. Owens Shark PT  Norwood Levo 02/27/2018, 12:19 PM  [place], [place], [address] Phone: [phone]   Fax:  [phone]  Name: Sonia Hill MRN: [id number] Date of Birth: June 05, 1956

## 2018-02-27 NOTE — Telephone Encounter (Signed)
Received return call from patient to schedule visit with Palliative Care. Visit scheduled for 02/28/18

## 2018-02-27 NOTE — Telephone Encounter (Signed)
Phone call placed to patient to offer to schedule visit with Palliative Care. VM left 

## 2018-02-28 ENCOUNTER — Other Ambulatory Visit: Payer: 59 | Admitting: Nurse Practitioner

## 2018-02-28 DIAGNOSIS — R634 Abnormal weight loss: Secondary | ICD-10-CM

## 2018-02-28 DIAGNOSIS — R112 Nausea with vomiting, unspecified: Secondary | ICD-10-CM

## 2018-02-28 DIAGNOSIS — Z515 Encounter for palliative care: Secondary | ICD-10-CM

## 2018-02-28 DIAGNOSIS — R531 Weakness: Secondary | ICD-10-CM

## 2018-02-28 DIAGNOSIS — G893 Neoplasm related pain (acute) (chronic): Secondary | ICD-10-CM

## 2018-02-28 DIAGNOSIS — R630 Anorexia: Secondary | ICD-10-CM

## 2018-02-28 DIAGNOSIS — E43 Unspecified severe protein-calorie malnutrition: Secondary | ICD-10-CM

## 2018-02-28 DIAGNOSIS — R609 Edema, unspecified: Secondary | ICD-10-CM

## 2018-03-01 ENCOUNTER — Encounter: Payer: Self-pay | Admitting: Physical Therapy

## 2018-03-01 ENCOUNTER — Ambulatory Visit: Payer: 59 | Admitting: Physical Therapy

## 2018-03-01 ENCOUNTER — Telehealth: Payer: Self-pay

## 2018-03-01 DIAGNOSIS — R262 Difficulty in walking, not elsewhere classified: Secondary | ICD-10-CM

## 2018-03-01 DIAGNOSIS — M6281 Muscle weakness (generalized): Secondary | ICD-10-CM

## 2018-03-01 DIAGNOSIS — I890 Lymphedema, not elsewhere classified: Secondary | ICD-10-CM

## 2018-03-01 LAB — URINE CULTURE: Culture: >=100000 — AB

## 2018-03-01 NOTE — Therapy (Signed)
[place], [place], [address] Phone: [phone]   Fax:  [phone]  Physical Therapy Treatment  Patient Details  Name: Sonia Hill MRN: [id number] Date of Birth: [birth year] Referring Provider: Dr. Vinie Sill    Encounter Date: 03/01/2018    Past Medical History:  Diagnosis Date  . Arthritis    neck, upper back, shoulders - no meds - yoga  . Chronic kidney disease    Only has right kidney  . Depression    Hx - no current problem  . GERD (gastroesophageal reflux disease)    diet controlled - no meds  . Hyperlipidemia    diet controlled  . Hypothyroidism   . lt breast ca dx'd 11/2009  . Osteoporosis, unspecified 12/06/2013  . PONV (postoperative nausea and vomiting)   . SBO (small bowel obstruction) ([place])   . SVD (spontaneous vaginal delivery)    x 1    Past Surgical History:  Procedure Laterality Date  . ABDOMINAL SURGERY    . BLADDER NECK RECONSTRUCTION     made a new urethea tube  . bladder tact  2008  . BOWEL RESECTION N/A 11/08/2017   Procedure: SMALL BOWEL RESECTION;  Surgeon: Michael Boston, MD;  Location: [facility];  Service: General;  Laterality: N/A;  . BREAST SURGERY     masectomy left breast  . coloscopy    . COLOSTOMY    . DILATION AND CURETTAGE OF UTERUS    . GASTROSTOMY N/A 11/08/2017   Procedure: INSERTION OF GASTROSTOMY TUBE;  Surgeon: Michael Boston, MD;  Location: [facility];  Service: General;  Laterality: N/A;  . IR GASTROSTOMY TUBE MOD SED  02/23/2018  . IR NEPHROSTOGRAM RIGHT THRU EXISTING ACCESS  02/08/2018  . IR NEPHROSTOMY PLACEMENT RIGHT  01/14/2018  . LAPAROSCOPY N/A 01/24/2013   Procedure: LAPROSCOPY OPERATIVE;  Surgeon: Margarette Asal, MD;  Location: [facility];  Service: Gynecology;  Laterality: N/A;  . LAPAROSCOPY N/A 11/08/2017   Procedure: LAPAROSCOPY DIAGNOSTIC;  Surgeon: Michael Boston, MD;  Location: [facility];  Service: General;  Laterality: N/A;  . LYSIS OF ADHESION  N/A 11/08/2017   Procedure: LYSIS OF ADHESION;  Surgeon: Michael Boston, MD;  Location: [facility];  Service: General;  Laterality: N/A;  . NEPHRECTOMY Left    left kidney dysfunction, recurrent pyelonephritis  . PARTIAL COLECTOMY N/A 11/08/2017   Procedure: PARTIAL COLECTOMY WITH NEW COLOSTOMY;  Surgeon: Michael Boston, MD;  Location: [facility];  Service: General;  Laterality: N/A;  . SALPINGOOPHORECTOMY Bilateral 01/24/2013   Procedure: SALPINGO OOPHORECTOMY;  Surgeon: Margarette Asal, MD;  Location: [facility];  Service: Gynecology;  Laterality: Bilateral;  . WISDOM TOOTH EXTRACTION      There were no vitals filed for this visit.  Subjective Assessment - 03/01/18 1111    Subjective  Pt had to take more pain medicine today and is noticing that she has more swelling in  both legs starting this morning.     Pertinent History  BRCA 1-2 negative with lobular breast cancer stage IV at presentation July 2011 status post left mastectomy and sentinel lymph node sampling in July 2011 for a lower inner quadrant T1 N1 M1, stage IV invasive lobular breast cancer, grade 1, strongly estrogen and progesterone receptor-positive, HER2 negative with MIB-1 of 9% and no HER2 amplification, with multiple sclerotic bone lesions at presentation seen only on CT scan (not on bone scan or PET scan), but  with biopsy-proven metastatic disease to bone; and an elevated CA  27.29 at presentation, single functioning kidney, status post bilateral salpingo-oophorectomy 01/24/2013, with benign pathology osteoporosis; the patient refuses bisphosphonate therapy; started [facility] February 2014, T score -2.7 and -3., s/p colostomy, recent abdominal cellulits    Patient Stated Goals  to have more endurance     Currently in Pain?  Yes    Pain Score  5    with medication                  Outpatient Re[facility]03/[facility]  Lymphedema Life Impact Scale Total Score  60.29 %            [facility] Adult PT Treatment/Exercise - 03/01/18 0001      Manual Therapy   Edema Management  added thick massage cream to dry skin on both lower legs with gentle PROM and elevation for comfort     Manual Lymphatic Drainage (MLD)  left thigh , knee, lower leg and foot with return along pathways, to right leg with extra time spent on lower leg around ankle                   PT Long Term Goals - 01/30/18 1250      PT LONG TERM GOAL #1   Title  Pt will receive appropriate compression garments for management of bilateral LE, genital and abdominal swelling to decrease risk of infection    Status  Achieved      PT LONG TERM GOAL #2   Title  Pt will be independent in self MLD for long term management of lymphedema    Status  Deferred      PT LONG TERM GOAL #3   Title  Pt will report a 25% improvmement in bilateral LE, swelling to allow improved comfort    Status  Deferred      PT LONG TERM GOAL #4   Title  Pt will report a 25% improvmement in bilateral LE, swelling to allow improved comfort    Status  Deferred      PT LONG TERM GOAL #5   Title  Pt will reports she has about 50% more endurance allowing her to do more activities at home.     Time  4    Period  Weeks    Status  New      Additional Long Term Goals   Additional Long Term Goals  Yes      PT LONG TERM GOAL #6   Title  Pt will impove # of sit to stand to 14 in 30 seconds indicating and improvment in funcitonal strength     Baseline  12    Time  4    Period  Weeks    Status  New      PT LONG TERM GOAL #7   Title  Pt will decrease TUG time to < 6 seconds indicating an improvment in functional mobility and balance     Baseline  6.7 sec    Time  4    Period  Weeks    Status  New      PT LONG TERM GOAL #8   Title  Pt will be independent in a home exercise program for strength and endurance     Time  4    Period  Weeks    Status  New            Plan - 03/01/18 1158    Clinical  Impression Statement  Pt appears to be worse today with increaed edema in right leg mostly below knee and fullness in left leg increased.  She is unsteady in her gait as she said she had increased her pain med.  The edema felt palpably better at the end of session and pt states she got relaxed and feels better as she leaves    Clinical Impairments Affecting Rehab Potential  1 kidney functioning, metastatic disease, osteoporosis, genital, abdominal and bilateral LE swelling    PT Treatment/Interventions  ADLs/Self Care Home Management;Therapeutic activities;Therapeutic exercise;Patient/family education;Manual lymph drainage;Manual techniques;Compression bandaging;Passive range of motion;Vasopneumatic Device;Taping;Orthotic Fit/Training    PT Next Visit Plan  Remeasure for recert due 5/45 and progress with exercise or lymphedema treatment as indicated. Assess wear of flat knit and self MLD.        Patient will benefit from skilled therapeutic intervention in order to improve the following deficits and impairments:     Visit Diagnosis: Muscle weakness (generalized)  Difficulty in walking, not elsewhere classified  Lymphedema, not elsewhere classified     Problem List Patient Active Problem List   Diagnosis Date Noted  . CKD (chronic kidney disease), stage III ([place]) 01/15/2018  . Acute lower UTI 01/14/2018  . Pelvic mass 01/14/2018  . Leukopenia due to antineoplastic chemotherapy ([employer]) 01/14/2018  . SIRS (systemic inflammatory response syndrome) ([place]) 01/14/2018  . Acute kidney injury superimposed on CKD ([place]) 01/13/2018  . ARF (acute renal failure) ([place]) 01/13/2018  . Acute kidney injury ([place])   . Gross hematuria   . Anemia 11/12/2017  . Malnutrition of moderate degree 11/11/2017  . Metastatic breast cancer to small intestine causing SBO s/p SB resection 11/08/2017 11/10/2017  . 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 Cachexia ([place])  11/10/2017  . New onset left bundle branch block (LBBB) 11/09/2017  . Colostomy obstruction from metastasis s/p colectomy/ostomy revision 11/08/2017 11/08/2017  . 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 Ascites 11/08/2017  . Hypoglycemia 11/04/2017  . Hypokalemia 11/04/2017  . Hypocalcemia 11/04/2017  . Small bowel obstruction from metastasis s/p SB resection 11/08/2017 11/03/2017  . Obstructive nephropathy 10/24/2017  . 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 Hyponatremia 10/16/2017  . Protein-calorie malnutrition, severe 07/11/2017  . Breast cancer metastasized to multiple sites ([place]) 10/16/2015  . Genetic testing 06/24/2015  . Osteoporosis 12/06/2013  . Cancer of lower-inner quadrant of left female breast ([place]) 03/22/2013  . Breast cancer metastasized to bone ([place]) 10/03/2012   Donato Heinz. Owens Shark PT  Norwood Levo 03/01/2018, 12:00 PM  [place], [place], [address] Phone: [phone]   Fax:  [phone]  Name: Sonia Hill MRN: [id number] Date of Birth: [birth year]

## 2018-03-01 NOTE — Telephone Encounter (Signed)
-----   Message from Gardenia Phlegm, NP sent at 02/28/2018  8:22 PM EDT ----- Patient urine is sensitive to cipro.  Please check on her and see how she is doing.   ----- Message ----- From: Buel Ream, [facility] [place] Sent: 02/26/2018  11:41 AM EDT To: Chauncey Cruel, MD

## 2018-03-01 NOTE — Telephone Encounter (Signed)
Called to check patient to see if ABT was picked up and if patient feeling better.  Spoke with Her husband because patient was in the shower.  Per husband antibiotic was picked up and patient starting to feel better now.  Nurse relayed to husband that if symptoms worsen or patient needs anything to call center.  Husband voiced understanding.

## 2018-03-02 NOTE — Progress Notes (Addendum)
PALLIATIVE CARE CONSULT VISIT   PATIENT NAME: Sonia Hill DOB: Oct 16, 1955 MRN: [id number]  PRIMARY CARE PROVIDER:   [name], MD  REFERRING PROVIDER:  [name], [place] [address] [place] [address]   Patient Care Team: [name], MD as PCP - General (Family Medicine) Magrinat, Virgie Dad, MD as Consulting Physician (Oncology) Molli Posey, MD as Consulting Physician (Obstetrics and Gynecology) Laurence Spates, MD as Consulting Physician (Gastroenterology) Leighton Ruff, MD as Consulting Physician (General Surgery) Delrae Rend, MD as Consulting Physician (Endocrinology) Clarene Essex, MD as Consulting Physician (Gastroenterology) Donato Heinz, MD as Consulting Physician (Nephrology  RESPONSIBLE PARTY:   Sonia Hill (husband) [phone] (home) [phone] (mobile)  ASSESSMENT/RECOMMENDATIONS and PLAN:   Metastatic Lobular stage IV breast cancer ;mets to bone, pelvis and small intestine with SBO s/p resection/colostomy  -s/p G-tube for nausea -currently refusing chemo/radiation options; patient with diease progression despite previous therapies and side effects  -patient drains g-tube for symptoms of nausea -g-tube with some leakage; most likely to ongoing weight loss; husband/patient to call oncology for further recommendations -instructed patient she could take SL zofran every 8 hrs as needed (was only taking BID)  Complications 2/2 malignancy and or treatment: -generalized pain AoCKD Nausea Anorexia/weight loss Protein-calorie malnutrition with ascites and lower extremity edema  -patient c/o nausea with oxycodone; instructed patient to take ultram every six hours and to use oral oxy for breakthrough pain; patient reports less nausea with ultram and less use of oxy with this regimen -has nephrostomy; no problems per patient -use PRN zofran every eight hours -continue to use supplements with diet -2+ lower  Ext edema; has compression stockings and elevates legs  ACP -advanced directives -husband is HCPOA -patient to discuss with husband    I spent 60 minutes providing this consultation,  from 10:30 to 11:30. More than 50% of the time in this consultation was spent coordinating communication.   HISTORY OF PRESENT ILLNESS:  Sonia Hill is a 62 y.o. year old female with multiple medical problems including metastatic breast cancer,bone, small intestine and vaginal cuff; s/p multiple chemotherapy agents with progressive disease, pain 2/2 malignancy, FTT, nausea, weight loss, protein-calorie malnutrition. Palliative Care was asked to help with symptom management address goals of care.   CODE STATUS: TBD  PPS: 50% HOSPICE ELIGIBILITY/DIAGNOSIS: TBD  PAST MEDICAL HISTORY:  Past Medical History:  Diagnosis Date  . Arthritis    neck, upper back, shoulders - no meds - yoga  . Chronic kidney disease    Only has right kidney  . Depression    Hx - no current problem  . GERD (gastroesophageal reflux disease)    diet controlled - no meds  . Hyperlipidemia    diet controlled  . Hypothyroidism   . lt breast ca dx'd 11/2009  . Osteoporosis, unspecified 12/06/2013  . PONV (postoperative nausea and vomiting)   . SBO (small bowel obstruction) ([place])   . SVD (spontaneous vaginal delivery)    x 1    SOCIAL HX:  Social History   Tobacco Use  . Smoking status: Never Smoker  . Smokeless tobacco: Never Used  Substance Use Topics  . Alcohol use: No    ALLERGIES:  Allergies  Allergen Reactions  . Sulfa Antibiotics Rash  . Adhesive [Tape] Rash    Ok to use paper tape and tegaderm over IV site  . Betadine [Povidone Iodine] Rash  . Mercury Rash    Reaction mercurachrome     PERTINENT  MEDICATIONS:  Outpatient Encounter Medications as of 02/28/2018  Medication Sig  . abemaciclib (VERZENIO) 100 MG tablet Take 1 tablet (100 mg total) by mouth 2 (two) times daily. Swallow tablets whole. Do not chew,  crush, or split tablets before swallowing.  Marland Kitchen ALPRAZolam (XANAX) 0.25 MG tablet TAKE 1 TABLET BY MOUTH EVERY 8 HOURS AS NEEDED FOR ANXIETY  . CALCIUM PO Take 2 tablets by mouth 2 (two) times daily.  . cetirizine (ZYRTEC) 10 MG tablet Take 10 mg by mouth daily.  . cholecalciferol (D-VI-SOL) 400 UNIT/ML LIQD Take 400 Units by mouth daily.  . ciprofloxacin (CIPRO) 250 MG tablet Take 1 tablet (250 mg total) by mouth 2 (two) times daily.  . D-MANNOSE PO Take 1 tablet by mouth daily.  . Digestive Enzymes (DIGESTIVE ENZYME PO) Take 1 tablet by mouth daily.  Marland Kitchen exemestane (AROMASIN) 25 MG tablet Take 1 tablet (25 mg total) by mouth daily after breakfast.  . fluconazole (DIFLUCAN) 100 MG tablet Take 1 tablet (100 mg total) by mouth daily.  . furosemide (LASIX) 40 MG tablet Take 1 tablet (40 mg total) by mouth daily.  Marland Kitchen gabapentin (NEURONTIN) 300 MG capsule Take 1 capsule (300 mg total) by mouth 3 (three) times daily as needed. (Patient taking differently: Take 300 mg by mouth 3 (three) times daily as needed (nerve pain). )  . heparin lock flush 100 UNIT/ML SOLN injection Inject 2.5 mLs (250 Units total) into the vein daily for 30 doses.  . Multiple Vitamin (MULTIVITAMIN WITH MINERALS) TABS tablet Take 1 tablet by mouth daily.  . Omega-3 Fatty Acids (FISH OIL PO) Take 5 mLs by mouth daily.  . ondansetron (ZOFRAN-ODT) 8 MG disintegrating tablet Take 1 tablet (8 mg total) by mouth every 8 (eight) hours as needed for nausea or vomiting.  Marland Kitchen oxyCODONE (ROXICODONE INTENSOL) 20 MG/ML concentrated solution Take 0.5 mLs (10 mg total) by mouth every 4 (four) hours as needed for up to 7 days for severe pain.  . Probiotic Product (PROBIOTIC DAILY PO) Take 1 tablet by mouth daily.  . simethicone (MYLICON) 219 MG chewable tablet Chew 125 mg by mouth every 2 (two) hours as needed for flatulence.   . Thyroid (NATURE-THROID) 81.25 MG TABS Take 81.25 mg by mouth daily.   No facility-administered encounter medications on  file as of 02/28/2018.     PHYSICAL EXAM:   General: NAD, frail appearing, cachetic Cardiovascular: regular rate and rhythm Pulmonary: clear ant fields Abdomen: soft, nontender, + bowel sounds +g-tube +nephrostomy +colostomy GU: no suprapubic tenderness Extremities: 2+ BLE edema, no joint deformities Skin: no rashes Neurological: Weakness but otherwise nonfocal  Stephanie G Martinique, NP

## 2018-03-05 ENCOUNTER — Telehealth: Payer: Self-pay | Admitting: Student

## 2018-03-05 NOTE — Telephone Encounter (Signed)
Patient called to report that both her nephrostomy and G-tube were leaking.   PA spoke with patient and husband over the phone.  She did have some leakage around the PCN this AM, however after emptying the collection bag has had no further leakage.  Of note, her PCN was recently injected 02/08/18 due to leakage and was found to be in good position.  She is planning for nephrostomy tube exchange on 03/09/18.  Patient also reports leakage around the gastrostomy tube. Denies fever, chills, abdominal pain, skin breakdown or irritation. When asked about the positioning of the bumper, she reports it has loosened from the skin.  Instructed on how to cinch the bumper to the skin.  Husband assisted and patient confirms there is tighter fit after following instructions.  Patient/husband to call back if leakage continues overnight and tomorrow. Suspect bumper repositioning will decrease leakage.   Patient instructed to call back if continues with further leakage.  Brynda Greathouse, MS RD PA-C

## 2018-03-06 ENCOUNTER — Other Ambulatory Visit: Payer: Self-pay | Admitting: *Deleted

## 2018-03-06 ENCOUNTER — Ambulatory Visit: Payer: 59

## 2018-03-06 DIAGNOSIS — I890 Lymphedema, not elsewhere classified: Secondary | ICD-10-CM

## 2018-03-06 DIAGNOSIS — M6281 Muscle weakness (generalized): Secondary | ICD-10-CM | POA: Diagnosis not present

## 2018-03-06 DIAGNOSIS — R262 Difficulty in walking, not elsewhere classified: Secondary | ICD-10-CM

## 2018-03-06 MED ORDER — CIPROFLOXACIN HCL 250 MG PO TABS: 250.0000 mg | ORAL_TABLET | Freq: Two times a day (BID) | ORAL | 0 refills | Status: DC

## 2018-03-06 NOTE — Addendum Note (Signed)
Addended by: Kipp Laurence on: 03/06/2018 12:51 PM   Modules accepted: Orders

## 2018-03-06 NOTE — Therapy (Signed)
[place], [place], [address] Phone: [phone]   Fax:  [phone]  Physical Therapy Treatment  Patient Details  Name: Sonia Hill MRN: [id number] Date of Birth: 1955/10/16 Referring Provider: Dr. Vinie Sill    Encounter Date: 03/06/2018  PT End of Session - 03/06/18 0937    Visit Number  12    Number of Visits  32    Date for PT Re-Evaluation  05/01/18    PT Start Time  8563   Pt arrived late   PT Stop Time  0935    PT Time Calculation (min)  38 min    Activity Tolerance  Patient tolerated treatment well    Behavior During Therapy  [facility] for tasks assessed/performed       Past Medical History:  Diagnosis Date  . Arthritis    neck, upper back, shoulders - no meds - yoga  . Chronic kidney disease    Only has right kidney  . Depression    Hx - no current problem  . GERD (gastroesophageal reflux disease)    diet controlled - no meds  . Hyperlipidemia    diet controlled  . Hypothyroidism   . lt breast ca dx'd 11/2009  . Osteoporosis, unspecified 12/06/2013  . PONV (postoperative nausea and vomiting)   . SBO (small bowel obstruction) ([place])   . SVD (spontaneous vaginal delivery)    x 1    Past Surgical History:  Procedure Laterality Date  . ABDOMINAL SURGERY    . BLADDER NECK RECONSTRUCTION     made a new urethea tube  . bladder tact  2008  . BOWEL RESECTION N/A 11/08/2017   Procedure: SMALL BOWEL RESECTION;  Surgeon: Michael Boston, MD;  Location: [facility];  Service: General;  Laterality: N/A;  . BREAST SURGERY     masectomy left breast  . coloscopy    . COLOSTOMY    . DILATION AND CURETTAGE OF UTERUS    . GASTROSTOMY N/A 11/08/2017   Procedure: INSERTION OF GASTROSTOMY TUBE;  Surgeon: Michael Boston, MD;  [facility];  Service: General;  Laterality: N/A;  . IR GASTROSTOMY TUBE MOD SED  02/23/2018  . IR NEPHROSTOGRAM RIGHT THRU EXISTING ACCESS  02/08/2018  . IR NEPHROSTOMY PLACEMENT  RIGHT  01/14/2018  . LAPAROSCOPY N/A 01/24/2013   Procedure: LAPROSCOPY OPERATIVE;  Surgeon: Margarette Asal, MD;  Location: [place] [facility];  Service: Gynecology;  Laterality: N/A;  . LAPAROSCOPY N/A 11/08/2017   Procedure: LAPAROSCOPY DIAGNOSTIC;  Surgeon: Michael Boston, MD;  [facility];  Service: General;  Laterality: N/A;  . LYSIS OF ADHESION N/A 11/08/2017   Procedure: LYSIS OF ADHESION;  Surgeon: Michael Boston, MD;  [facility]: [facility];  Service: General;  Laterality: N/A;  . NEPHRECTOMY Left    left kidney dysfunction, recurrent pyelonephritis  . PARTIAL COLECTOMY N/A 11/08/2017   Procedure: PARTIAL COLECTOMY WITH NEW COLOSTOMY;  Surgeon: Michael Boston, MD;  [facility];  Service: General;  Laterality: N/A;  . SALPINGOOPHORECTOMY Bilateral 01/24/2013   Procedure: SALPINGO OOPHORECTOMY;  Surgeon: Margarette Asal, MD;  Location: [place] [facility];  Service: Gynecology;  Laterality: Bilateral;  . WISDOM TOOTH EXTRACTION      There were no vitals filed for this visit.  Subjective Assessment - 03/06/18 0902    Subjective  I havew just been so fatigued as of late. That and the swelling in my legs slows me down alot at home.     Pertinent History  BRCA 1-2  negative with lobular breast cancer stage IV at presentation July 2011 status post left mastectomy and sentinel lymph node sampling in July 2011 for a lower inner quadrant T1 N1 M1, stage IV invasive lobular breast cancer, grade 1, strongly estrogen and progesterone receptor-positive, HER2 negative with MIB-1 of 9% and no HER2 amplification, with multiple sclerotic bone lesions at presentation seen only on CT scan (not on bone scan or PET scan), but  with biopsy-proven metastatic disease to bone; and an elevated CA 27.29 at presentation, single functioning kidney, status post bilateral salpingo-oophorectomy 01/24/2013, with benign pathology osteoporosis; the patient refuses bisphosphonate therapy; started [facility] February 2014, T score -2.7 and -3., s/p  colostomy, recent abdominal cellulits    Patient Stated Goals  to have more endurance     Currently in Pain?  Yes    Pain Score  6     Pain Location  Abdomen    Pain Orientation  Posterior;Anterior    Pain Descriptors / Indicators  Discomfort    Pain Type  Chronic pain    Pain Onset  More than a month ago    Pain Frequency  Constant    Aggravating Factors   just always hurts    Pain Relieving Factors  pain meds some            LYMPHEDEMA/ONCOLOGY QUESTIONNAIRE - 03/06/18 0904      Right Lower Extremity Lymphedema   10 cm Proximal to Suprapatella  38 cm    At Midpatella/Popliteal Crease  35.1 cm    30 cm Proximal to Floor at Lateral Plantar Foot  34.9 cm    20 cm Proximal to Floor at Lateral Plantar Foot  27._0 cm Proximal to Floor at Lateral Malleoli  21.2 cm    5 cm Proximal to 1st MTP Joint  20.6 cm    Across MTP Joint  20.8 cm    Around Proximal Great Toe  7 cm      Left Lower Extremity Lymphedema   20 cm Proximal to Suprapatella  45.7 cm    10 cm Proximal to Suprapatella  42.8 cm    At Midpatella/Popliteal Crease  38.7 cm    30 cm Proximal to Floor at Lateral Plantar Foot  34.3 cm    20 cm Proximal to Floor at Lateral Plantar Foot  28.3 cm    10 cm Proximal to Floor at Lateral Malleoli  21.6 cm    5 cm Proximal to 1st MTP Joint  20 cm    Across MTP Joint  20.5 cm    Around Proximal Great Toe  6.9 cm           [facility]patient [facility]  Lymphedema Life Impact Scale Total Score  60.29 %           [facility] Adult PT Treatment/Exercise - 03/06/18 0001      Manual Therapy   Manual Lymphatic Drainage (MLD)  In Supine with HOB elevated: Short neck, 5 diaphragmatic breaths, Rt axillary nodes, Rt inguino-axillary anastomosis, and then Rt LE from lateral upper thigh to dorsal foot working from proximal to distal then retracing all steps; then same to [place]  - 03/06/18 1227      PT LONG TERM GOAL #1   Title  Pt will receive appropriate compression garments for management  of bilateral LE, genital and abdominal swelling to decrease risk of infection    Status  Achieved      PT LONG TERM GOAL #2   Title  Pt will be independent in self MLD for long term management of lymphedema    Status  Deferred      PT LONG TERM GOAL #3   Title  Pt will report a 25% improvmement in bilateral LE, swelling to allow improved comfort    Status  Deferred      PT LONG TERM GOAL #4   Title  Pt will report a 25% improvmement in bilateral LE, swelling to allow improved comfort    Status  Deferred      PT LONG TERM GOAL #5   Title  Pt will reports she has about 50% more endurance allowing her to do more activities at home.     Status  Deferred      PT LONG TERM GOAL #6   Title  Pt will impove # of sit to stand to 14 in 30 seconds indicating and improvment in funcitonal strength     Time  4    Period  Weeks    Status  Deferred      PT LONG TERM GOAL #7   Title  Pt and caregiver will be independent in symptom management techniques for lymphedema such as remedial exercise, elevation and manual lymph drainage    Baseline  Limited knowledge-03/06/18    Time  8    Period  Weeks    Status  New      PT LONG TERM GOAL #8   Title  Pt will be independent in a home exercise program for strength and endurance     Time  8    Period  Weeks    Status  On-going            Plan - 03/06/18 0937    Clinical Impression Statement  Pt continues to be very weak and fatigued reporting it very difficult for her to get here this early and that's why she was late today. This was her last earlier morning appt. She would like to continue physical therapy as she reports havig temporary relief of swelling and heaviness in her legs. The majoirty of her fatigue for now she attributes to the pain meds she is having to take more of due to the abdominal discomfort. Her circumference is  increased from last time measured and this is due to the nature of her disease progression. Pt could benefit from continued therapy to address symptom management techniques with her friend/caretaker and to get pt on a HEP for strength/endurance as she could tolerate now.     Rehab Potential  Good    Clinical Impairments Affecting Rehab Potential  1 kidney functioning, metastatic disease, osteoporosis, genital, abdominal and bilateral LE swelling    PT Frequency  2x / week    PT Duration  8 weeks    PT Treatment/Interventions  ADLs/Self Care Home Management;Therapeutic activities;Therapeutic exercise;Patient/family education;Manual lymph drainage;Manual techniques;Compression bandaging;Passive range of motion;Vasopneumatic Device;Taping;Orthotic Fit/Training    PT Next Visit Plan  Rnenewal done this visit. PT updated goals directing towards pt comfort and symtpom management.     Consulted and Agree with Plan of Care  Patient       Patient will benefit from skilled therapeutic intervention in order to improve the following deficits and impairments:  Increased edema, Difficulty walking, Decreased knowledge of precautions, Decreased endurance, Decreased  strength, Pain, Decreased activity tolerance, Impaired perceived functional ability, Decreased mobility  Visit Diagnosis: Muscle weakness (generalized)  Difficulty in walking, not elsewhere classified  Lymphedema, not elsewhere classified     Problem List Patient Active Problem List   Diagnosis Date Noted  . CKD (chronic kidney disease), stage III ([place]) 01/15/2018  . Acute lower UTI 01/14/2018  . Pelvic mass 01/14/2018  . Leukopenia due to antineoplastic chemotherapy ([place]) 01/14/2018  . SIRS (systemic inflammatory response syndrome) ([place]) 01/14/2018  . Acute kidney injury superimposed on CKD ([place]) 01/13/2018  . ARF (acute renal failure) ([place]) 01/13/2018  . Acute kidney injury ([place])   . Gross hematuria   . Anemia 11/12/2017  .  Malnutrition of moderate degree 11/11/2017  . Metastatic breast cancer to small intestine causing SBO s/p SB resection 11/08/2017 11/10/2017  . 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 Cachexia ([place]) 11/10/2017  . New onset left bundle branch block (LBBB) 11/09/2017  . Colostomy obstruction from metastasis s/p colectomy/ostomy revision 11/08/2017 11/08/2017  . 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 Ascites 11/08/2017  . Hypoglycemia 11/04/2017  . Hypokalemia 11/04/2017  . Hypocalcemia 11/04/2017  . Small bowel obstruction from metastasis s/p SB resection 11/08/2017 11/03/2017  . Obstructive nephropathy 10/24/2017  . 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 Hyponatremia 10/16/2017  . Protein-calorie malnutrition, severe 07/11/2017  . Breast cancer metastasized to multiple sites ([place]) 10/16/2015  . Genetic testing 06/24/2015  . Osteoporosis 12/06/2013  . Cancer of lower-inner quadrant of left female breast ([place]) 03/22/2013  . Breast cancer metastasized to bone [facility]) 10/03/2012    Otelia Limes, PTA 03/06/2018, 12:36 PM  [place], [place], [address] Phone: [phone]   Fax:  [phone]  Name: Sonia Hill MRN: [id number] Date of Birth: 1956/05/15

## 2018-03-08 ENCOUNTER — Encounter: Payer: Self-pay | Admitting: Physical Therapy

## 2018-03-08 ENCOUNTER — Ambulatory Visit: Payer: 59 | Admitting: Physical Therapy

## 2018-03-08 DIAGNOSIS — R262 Difficulty in walking, not elsewhere classified: Secondary | ICD-10-CM

## 2018-03-08 DIAGNOSIS — I890 Lymphedema, not elsewhere classified: Secondary | ICD-10-CM

## 2018-03-08 DIAGNOSIS — M6281 Muscle weakness (generalized): Secondary | ICD-10-CM

## 2018-03-08 NOTE — Therapy (Signed)
[place], [place], [address] Phone: [phone]   Fax:  [phone]  Physical Therapy Treatment  Patient Details  Name: Sonia Hill MRN: [id number] Date of Birth: [birth year] Referring Provider (PT): Dr. [name]   Encounter Date: 03/08/2018  PT End of Session - 03/08/18 1252    Visit Number  13    Number of Visits  33    Date for PT Re-Evaluation  05/01/18    PT Start Time  1423    PT Stop Time  1100    PT Time Calculation (min)  45 min    Activity Tolerance  Patient tolerated treatment well    Behavior During Therapy  [facility] for tasks assessed/performed       Past Medical History:  Diagnosis Date  . Arthritis    neck, upper back, shoulders - no meds - yoga  . Chronic kidney disease    Only has right kidney  . Depression    Hx - no current problem  . GERD (gastroesophageal reflux disease)    diet controlled - no meds  . Hyperlipidemia    diet controlled  . Hypothyroidism   . lt breast ca dx'd 11/2009  . Osteoporosis, unspecified 12/06/2013  . PONV (postoperative nausea and vomiting)   . SBO (small bowel obstruction) ([place])   . SVD (spontaneous vaginal delivery)    x 1    Past Surgical History:  Procedure Laterality Date  . ABDOMINAL SURGERY    . BLADDER NECK RECONSTRUCTION     made a new urethea tube  . bladder tact  2008  . BOWEL RESECTION N/A 11/08/2017   Procedure: SMALL BOWEL RESECTION;  Surgeon: Michael Boston, MD;  [facility];  Service: General;  Laterality: N/A;  . BREAST SURGERY     masectomy left breast  . coloscopy    . COLOSTOMY    . DILATION AND CURETTAGE OF UTERUS    . GASTROSTOMY N/A 11/08/2017   Procedure: INSERTION OF GASTROSTOMY TUBE;  Surgeon: Michael Boston, MD;  [facility];  Service: General;  Laterality: N/A;  . IR GASTROSTOMY TUBE MOD SED  02/23/2018  . IR NEPHROSTOGRAM RIGHT THRU EXISTING ACCESS  02/08/2018  . IR NEPHROSTOMY PLACEMENT RIGHT  01/14/2018    . LAPAROSCOPY N/A 01/24/2013   Procedure: LAPROSCOPY OPERATIVE;  Surgeon: Margarette Asal, MD;  Location: [place] [facility];  Service: Gynecology;  Laterality: N/A;  . LAPAROSCOPY N/A 11/08/2017   Procedure: LAPAROSCOPY DIAGNOSTIC;  Surgeon: Michael Boston, MD;  [facility];  Service: General;  Laterality: N/A;  . LYSIS OF ADHESION N/A 11/08/2017   Procedure: LYSIS OF ADHESION;  Surgeon: Michael Boston, MD;  [facility];  Service: General;  Laterality: N/A;  . NEPHRECTOMY Left    left kidney dysfunction, recurrent pyelonephritis  . PARTIAL COLECTOMY N/A 11/08/2017   Procedure: PARTIAL COLECTOMY WITH NEW COLOSTOMY;  Surgeon: Michael Boston, MD;  [facility];  Service: General;  Laterality: N/A;  . SALPINGOOPHORECTOMY Bilateral 01/24/2013   Procedure: SALPINGO OOPHORECTOMY;  Surgeon: Margarette Asal, MD;  Location: [place] [facility];  Service: Gynecology;  Laterality: Bilateral;  . WISDOM TOOTH EXTRACTION      There were no vitals filed for this visit.  Subjective Assessment - 03/08/18 1243    Subjective  Pt continues to have a difficult time.  She needs to have hand hold assist to walk into clinic today     Pertinent History  BRCA 1-2 negative with lobular breast cancer  stage IV at presentation July 2011 status post left mastectomy and sentinel lymph node sampling in July 2011 for a lower inner quadrant T1 N1 M1, stage IV invasive lobular breast cancer, grade 1, strongly estrogen and progesterone receptor-positive, HER2 negative with MIB-1 of 9% and no HER2 amplification, with multiple sclerotic bone lesions at presentation seen only on CT scan (not on bone scan or PET scan), but  with biopsy-proven metastatic disease to bone; and an elevated CA 27.29 at presentation, single functioning kidney, status post bilateral salpingo-oophorectomy 01/24/2013, with benign pathology osteoporosis; the patient refuses bisphosphonate therapy; started [facility] February 2014, T score -2.7 and -3., s/p colostomy, recent  abdominal cellulits    Currently in Pain?  Yes    Pain Score  --   did not rate                       Outpatient [facility]03/[facility]  Lymphedema Life Impact Scale Total Score  60.29 %           [facility] Adult PT Treatment/Exercise - 03/08/18 0001      Manual Therapy   Manual Therapy  Edema management;Manual Lymphatic Drainage (MLD)    Edema Management  pt has new fullness in right leg at cicumferentially at proximal calf. No warmess, redness or tenderness is present     Manual Lymphatic Drainage (MLD)  In Supine with HOB elevated: Short neck, 5 diaphragmatic breaths, Rt axillary nodes, Rt inguino-axillary anastomosis, and then Rt LE from lateral upper thigh to dorsal foot working from proximal to distal then retracing all steps; then same to [place] - 03/06/18 1227      PT LONG TERM GOAL #1   Title  Pt will receive appropriate compression garments for management of bilateral LE, genital and abdominal swelling to decrease risk of infection    Status  Achieved      PT LONG TERM GOAL #2   Title  Pt will be independent in self MLD for long term management of lymphedema    Status  Deferred      PT LONG TERM GOAL #3   Title  Pt will report a 25% improvmement in bilateral LE, swelling to allow improved comfort    Status  Deferred      PT LONG TERM GOAL #4   Title  Pt will report a 25% improvmement in bilateral LE, swelling to allow improved comfort    Status  Deferred      PT LONG TERM GOAL #5   Title  Pt will reports she has about 50% more endurance allowing her to do more activities at home.     Status  Deferred      PT LONG TERM GOAL #6   Title  Pt will impove # of sit to stand to 14 in 30 seconds indicating and improvment in funcitonal strength     Time  4    Period  Weeks    Status  Deferred      PT LONG TERM GOAL #7   Title  Pt and caregiver will be  independent in symptom management techniques for lymphedema such as remedial exercise, elevation and manual lymph drainage    Baseline  Limited knowledge-03/06/18    Time  8    Period  Weeks    Status  New      PT LONG TERM GOAL #8   Title  Pt will be independent in a home exercise program for strength and endurance     Time  8    Period  Weeks    Status  On-going            Plan - 03/08/18 1254    Clinical Impression Statement  Pt is having a medical decline.  She has some new swelling around left calf secondary to unknown cause that is not warm , red or tender.  She was very relaxed duing MLD today and gets symptomatic relief from that     Clinical Impairments Affecting Rehab Potential  1 kidney functioning, metastatic disease, osteoporosis, genital, abdominal and bilateral LE swelling    PT Next Visit Plan  continue with MLD for comfort     Consulted and Agree with Plan of Care  Patient       Patient will benefit from skilled therapeutic intervention in order to improve the following deficits and impairments:     Visit Diagnosis: Muscle weakness (generalized)  Lymphedema, not elsewhere classified  Difficulty in walking, not elsewhere classified     Problem List Patient Active Problem List   Diagnosis Date Noted  . CKD (chronic kidney disease), stage III ([place]) 01/15/2018  . Acute lower UTI 01/14/2018  . Pelvic mass 01/14/2018  . Leukopenia due to antineoplastic chemotherapy ([place]) 01/14/2018  . SIRS (systemic inflammatory response syndrome) ([place]) 01/14/2018  . Acute kidney injury superimposed on CKD ([place]) 01/13/2018  . ARF (acute renal failure) ([place]) 01/13/2018  . Acute kidney injury ([place])   . Gross hematuria   . Anemia 11/12/2017  . Malnutrition of moderate degree 11/11/2017  . Metastatic breast cancer to small intestine causing SBO s/p SB resection 11/08/2017 11/10/2017  . 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  Cachexia ([place]) 11/10/2017  . New onset left bundle branch block (LBBB) 11/09/2017  . Colostomy obstruction from metastasis s/p colectomy/ostomy revision 11/08/2017 11/08/2017  . 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 Ascites 11/08/2017  . Hypoglycemia 11/04/2017  . Hypokalemia 11/04/2017  . Hypocalcemia 11/04/2017  . Small bowel obstruction from metastasis s/p SB resection 11/08/2017 11/03/2017  . Obstructive nephropathy 10/24/2017  . 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 Hyponatremia 10/16/2017  . Protein-calorie malnutrition, severe 07/11/2017  . Breast cancer metastasized to multiple sites ([place]) 10/16/2015  . Genetic testing 06/24/2015  . Osteoporosis 12/06/2013  . Cancer of lower-inner quadrant of left female breast ([place]) 03/22/2013  . Breast cancer metastasized to bone ([place]) 10/03/2012   Donato Heinz. Owens Shark PT  Norwood Levo 03/08/2018, 12:57 PM  [place], [place], [address] Phone: [phone]   Fax:  [phone]  Name: Sonia Hill MRN: [id number] Date of Birth: Sep 11, 1955

## 2018-03-09 ENCOUNTER — Ambulatory Visit (HOSPITAL_COMMUNITY)
Admission: RE | Admit: 2018-03-09 | Discharge: 2018-03-09 | Disposition: A | Discharge status: Home / Self Care | Payer: 59 | Source: Ambulatory Visit | Attending: Radiology | Admitting: Radiology

## 2018-03-09 ENCOUNTER — Other Ambulatory Visit: Payer: Self-pay | Admitting: Interventional Radiology

## 2018-03-09 ENCOUNTER — Other Ambulatory Visit: Payer: Self-pay | Admitting: Radiology

## 2018-03-09 ENCOUNTER — Encounter (HOSPITAL_COMMUNITY): Payer: Self-pay | Admitting: Interventional Radiology

## 2018-03-09 DIAGNOSIS — Z436 Encounter for attention to other artificial openings of urinary tract: Secondary | ICD-10-CM | POA: Insufficient documentation

## 2018-03-09 DIAGNOSIS — N1330 Unspecified hydronephrosis: Secondary | ICD-10-CM

## 2018-03-09 DIAGNOSIS — Z905 Acquired absence of kidney: Secondary | ICD-10-CM | POA: Insufficient documentation

## 2018-03-09 DIAGNOSIS — Z853 Personal history of malignant neoplasm of breast: Secondary | ICD-10-CM | POA: Insufficient documentation

## 2018-03-09 HISTORY — PX: IR NEPHROSTOMY EXCHANGE RIGHT: IMG6070

## 2018-03-09 MED ORDER — IOPAMIDOL (ISOVUE-300) INJECTION 61%
50.0000 mL | Freq: Once | INTRAVENOUS | Status: AC | PRN
  Administered 2018-03-09: 10 mL

## 2018-03-09 MED ORDER — LIDOCAINE HCL 1 % IJ SOLN
INTRAMUSCULAR | Status: AC | PRN
  Administered 2018-03-09: 5 mL via INTRADERMAL

## 2018-03-09 MED ORDER — IOPAMIDOL (ISOVUE-300) INJECTION 61%
INTRAVENOUS | Status: AC
  Administered 2018-03-09: 10 mL
  Filled 2018-03-09: qty 50

## 2018-03-11 ENCOUNTER — Other Ambulatory Visit: Payer: Self-pay | Admitting: Oncology

## 2018-03-12 ENCOUNTER — Telehealth: Payer: Self-pay | Admitting: Oncology

## 2018-03-12 ENCOUNTER — Telehealth: Payer: Self-pay | Admitting: *Deleted

## 2018-03-12 NOTE — Telephone Encounter (Signed)
"  Sonia Hill calling to reschedule tomorrow's lab appointment to 12:00 pm because AI have another appointment at 10:30 am with a provider."  With transfer, noted appointment has been rescheduled.  New appointment information provided at this time.  Denies further needs or questions at this time.

## 2018-03-12 NOTE — Telephone Encounter (Signed)
Patient called to reschedule  °

## 2018-03-13 ENCOUNTER — Inpatient Hospital Stay: Payer: 59

## 2018-03-13 ENCOUNTER — Encounter: Payer: Self-pay | Admitting: Physical Therapy

## 2018-03-13 ENCOUNTER — Inpatient Hospital Stay: Payer: 59 | Attending: Oncology

## 2018-03-13 ENCOUNTER — Ambulatory Visit: Payer: 59 | Attending: Oncology | Admitting: Physical Therapy

## 2018-03-13 DIAGNOSIS — Z79811 Long term (current) use of aromatase inhibitors: Secondary | ICD-10-CM | POA: Insufficient documentation

## 2018-03-13 DIAGNOSIS — E440 Moderate protein-calorie malnutrition: Secondary | ICD-10-CM

## 2018-03-13 DIAGNOSIS — Z9012 Acquired absence of left breast and nipple: Secondary | ICD-10-CM | POA: Diagnosis not present

## 2018-03-13 DIAGNOSIS — C50312 Malignant neoplasm of lower-inner quadrant of left female breast: Secondary | ICD-10-CM | POA: Insufficient documentation

## 2018-03-13 DIAGNOSIS — R110 Nausea: Secondary | ICD-10-CM | POA: Diagnosis not present

## 2018-03-13 DIAGNOSIS — K219 Gastro-esophageal reflux disease without esophagitis: Secondary | ICD-10-CM | POA: Diagnosis not present

## 2018-03-13 DIAGNOSIS — Z9049 Acquired absence of other specified parts of digestive tract: Secondary | ICD-10-CM | POA: Insufficient documentation

## 2018-03-13 DIAGNOSIS — R978 Other abnormal tumor markers: Secondary | ICD-10-CM | POA: Diagnosis not present

## 2018-03-13 DIAGNOSIS — Z933 Colostomy status: Secondary | ICD-10-CM | POA: Insufficient documentation

## 2018-03-13 DIAGNOSIS — M129 Arthropathy, unspecified: Secondary | ICD-10-CM | POA: Insufficient documentation

## 2018-03-13 DIAGNOSIS — F418 Other specified anxiety disorders: Secondary | ICD-10-CM | POA: Diagnosis not present

## 2018-03-13 DIAGNOSIS — I700 Atherosclerosis of aorta: Secondary | ICD-10-CM | POA: Insufficient documentation

## 2018-03-13 DIAGNOSIS — R630 Anorexia: Secondary | ICD-10-CM | POA: Insufficient documentation

## 2018-03-13 DIAGNOSIS — F329 Major depressive disorder, single episode, unspecified: Secondary | ICD-10-CM | POA: Diagnosis not present

## 2018-03-13 DIAGNOSIS — E039 Hypothyroidism, unspecified: Secondary | ICD-10-CM | POA: Insufficient documentation

## 2018-03-13 DIAGNOSIS — R911 Solitary pulmonary nodule: Secondary | ICD-10-CM | POA: Diagnosis not present

## 2018-03-13 DIAGNOSIS — C786 Secondary malignant neoplasm of retroperitoneum and peritoneum: Secondary | ICD-10-CM | POA: Diagnosis not present

## 2018-03-13 DIAGNOSIS — Z87440 Personal history of urinary (tract) infections: Secondary | ICD-10-CM | POA: Insufficient documentation

## 2018-03-13 DIAGNOSIS — J90 Pleural effusion, not elsewhere classified: Secondary | ICD-10-CM | POA: Insufficient documentation

## 2018-03-13 DIAGNOSIS — R262 Difficulty in walking, not elsewhere classified: Secondary | ICD-10-CM

## 2018-03-13 DIAGNOSIS — C7951 Secondary malignant neoplasm of bone: Secondary | ICD-10-CM | POA: Insufficient documentation

## 2018-03-13 DIAGNOSIS — E785 Hyperlipidemia, unspecified: Secondary | ICD-10-CM | POA: Diagnosis not present

## 2018-03-13 DIAGNOSIS — M6281 Muscle weakness (generalized): Secondary | ICD-10-CM | POA: Diagnosis present

## 2018-03-13 DIAGNOSIS — Z905 Acquired absence of kidney: Secondary | ICD-10-CM | POA: Insufficient documentation

## 2018-03-13 DIAGNOSIS — M810 Age-related osteoporosis without current pathological fracture: Secondary | ICD-10-CM | POA: Insufficient documentation

## 2018-03-13 DIAGNOSIS — Z79899 Other long term (current) drug therapy: Secondary | ICD-10-CM | POA: Diagnosis not present

## 2018-03-13 DIAGNOSIS — R188 Other ascites: Secondary | ICD-10-CM | POA: Insufficient documentation

## 2018-03-13 DIAGNOSIS — Z170 Estrogen receptor positive status [ER+]: Secondary | ICD-10-CM | POA: Insufficient documentation

## 2018-03-13 DIAGNOSIS — M818 Other osteoporosis without current pathological fracture: Secondary | ICD-10-CM

## 2018-03-13 DIAGNOSIS — I890 Lymphedema, not elsewhere classified: Secondary | ICD-10-CM | POA: Diagnosis present

## 2018-03-13 DIAGNOSIS — Z803 Family history of malignant neoplasm of breast: Secondary | ICD-10-CM | POA: Insufficient documentation

## 2018-03-13 DIAGNOSIS — C50912 Malignant neoplasm of unspecified site of left female breast: Secondary | ICD-10-CM

## 2018-03-13 DIAGNOSIS — K56609 Unspecified intestinal obstruction, unspecified as to partial versus complete obstruction: Secondary | ICD-10-CM

## 2018-03-13 LAB — COMPREHENSIVE METABOLIC PANEL
ALBUMIN: 3.3 g/dL — AB (ref 3.5–5.0)
ALT: 9 U/L (ref 0–44)
AST: 47 U/L — ABNORMAL HIGH (ref 15–41)
Alkaline Phosphatase: 82 U/L (ref 38–126)
Anion gap: 12 (ref 5–15)
BILIRUBIN TOTAL: 0.6 mg/dL (ref 0.3–1.2)
BUN: 24 mg/dL — ABNORMAL HIGH (ref 8–23)
CO2: 34 mmol/L — AB (ref 22–32)
CREATININE: 1.43 mg/dL — AB (ref 0.44–1.00)
Calcium: 10 mg/dL (ref 8.9–10.3)
Chloride: 84 mmol/L — ABNORMAL LOW (ref 98–111)
GFR calc Af Amer: 44 mL/min — ABNORMAL LOW (ref >60)
GFR calc non Af Amer: 38 mL/min — ABNORMAL LOW (ref >60)
GLUCOSE: 86 mg/dL (ref 70–99)
Potassium: 3.5 mmol/L (ref 3.5–5.1)
SODIUM: 130 mmol/L — AB (ref 135–145)
Total Protein: 7.5 g/dL (ref 6.5–8.1)

## 2018-03-13 LAB — CBC WITH DIFFERENTIAL/PLATELET
BASOS PCT: 0 %
Basophils Absolute: 0 10*3/uL (ref 0.0–0.1)
Eosinophils Absolute: 0 10*3/uL (ref 0.0–0.5)
Eosinophils Relative: 0 %
HEMATOCRIT: 33.9 % — AB (ref 34.8–46.6)
Hemoglobin: 11.5 g/dL — ABNORMAL LOW (ref 11.6–15.9)
LYMPHS ABS: 0.3 10*3/uL — AB (ref 0.9–3.3)
Lymphocytes Relative: 7 %
MCH: 31.1 pg (ref 25.1–34.0)
MCHC: 34 g/dL (ref 31.5–36.0)
MCV: 91.6 fL (ref 79.5–101.0)
Monocytes Absolute: 0.2 10*3/uL (ref 0.1–0.9)
Monocytes Relative: 4 %
NEUTROS ABS: 4.3 10*3/uL (ref 1.5–6.5)
NEUTROS PCT: 89 %
Platelets: 432 10*3/uL — ABNORMAL HIGH (ref 145–400)
RBC: 3.7 MIL/uL (ref 3.70–5.45)
RDW: 14.4 % (ref 11.2–14.5)
WBC: 4.9 10*3/uL (ref 3.9–10.3)

## 2018-03-13 NOTE — Therapy (Signed)
[place], [place], [address] Phone: [phone]   Fax:  [phone]  Physical Therapy Treatment  Patient Details  Name: Sonia Hill MRN: [id number] Date of Birth: 13-Aug-1955 Referring Provider (PT): Dr. [name]   Encounter Date: 03/13/2018  PT End of Session - 03/13/18 1350    Visit Number  14    Number of Visits  33    Date for PT Re-Evaluation  05/01/18    PT Start Time  1300    PT Stop Time  1345    PT Time Calculation (min)  45 min    Activity Tolerance  Patient tolerated treatment well    Behavior During Therapy  [facility] for tasks assessed/performed       Past Medical History:  Diagnosis Date  . Arthritis    neck, upper back, shoulders - no meds - yoga  . Chronic kidney disease    Only has right kidney  . Depression    Hx - no current problem  . GERD (gastroesophageal reflux disease)    diet controlled - no meds  . Hyperlipidemia    diet controlled  . Hypothyroidism   . lt breast ca dx'd 11/2009  . Osteoporosis, unspecified 12/06/2013  . PONV (postoperative nausea and vomiting)   . SBO (small bowel obstruction) ([place])   . SVD (spontaneous vaginal delivery)    x 1    Past Surgical History:  Procedure Laterality Date  . ABDOMINAL SURGERY    . BLADDER NECK RECONSTRUCTION     made a new urethea tube  . bladder tact  2008  . BOWEL RESECTION N/A 11/08/2017   Procedure: SMALL BOWEL RESECTION;  Surgeon: Michael Boston, MD;  Location: [facility];  Service: General;  Laterality: N/A;  . BREAST SURGERY     masectomy left breast  . coloscopy    . COLOSTOMY    . DILATION AND CURETTAGE OF UTERUS    . GASTROSTOMY N/A 11/08/2017   Procedure: INSERTION OF GASTROSTOMY TUBE;  Surgeon: Michael Boston, MD;  Location: [facility];  Service: General;  Laterality: N/A;  . IR GASTROSTOMY TUBE MOD SED  02/23/2018  . IR NEPHROSTOGRAM RIGHT THRU EXISTING ACCESS  02/08/2018  . IR NEPHROSTOMY EXCHANGE RIGHT  03/09/2018   . IR NEPHROSTOMY PLACEMENT RIGHT  01/14/2018  . LAPAROSCOPY N/A 01/24/2013   Procedure: LAPROSCOPY OPERATIVE;  Surgeon: Margarette Asal, MD;  Location: [facility];  Service: Gynecology;  Laterality: N/A;  . LAPAROSCOPY N/A 11/08/2017   Procedure: LAPAROSCOPY DIAGNOSTIC;  Surgeon: Michael Boston, MD;  Location: [facility];  Service: General;  Laterality: N/A;  . LYSIS OF ADHESION N/A 11/08/2017   Procedure: LYSIS OF ADHESION;  Surgeon: Michael Boston, MD;  Location: [facility];  Service: General;  Laterality: N/A;  . NEPHRECTOMY Left    left kidney dysfunction, recurrent pyelonephritis  . PARTIAL COLECTOMY N/A 11/08/2017   Procedure: PARTIAL COLECTOMY WITH NEW COLOSTOMY;  Surgeon: Michael Boston, MD;  Location: [facility];  Service: General;  Laterality: N/A;  . SALPINGOOPHORECTOMY Bilateral 01/24/2013   Procedure: SALPINGO OOPHORECTOMY;  Surgeon: Margarette Asal, MD;  Location: [facility];  Service: Gynecology;  Laterality: Bilateral;  . WISDOM TOOTH EXTRACTION      There were no vitals filed for this visit.  Subjective Assessment - 03/13/18 1307    Subjective  Pt had her nephrostomy tube changed out on Friday and the swelling in her legs decreased the next day.  She is still doing well and has  a much easier time manipulating when her legs are not so heavy     Patient is accompained by:  Family member   sister Sonia Hill    Pertinent History  BRCA 1-2 negative with lobular breast cancer stage IV at presentation July 2011 status post left mastectomy and sentinel lymph node sampling in July 2011 for a lower inner quadrant T1 N1 M1, stage IV invasive lobular breast cancer, grade 1, strongly estrogen and progesterone receptor-positive, HER2 negative with MIB-1 of 9% and no HER2 amplification, with multiple sclerotic bone lesions at presentation seen only on CT scan (not on bone scan or PET scan), but  with biopsy-proven metastatic disease to bone; and an elevated CA 27.29 at presentation, single functioning kidney, status post  bilateral salpingo-oophorectomy 01/24/2013, with benign pathology osteoporosis; the patient refuses bisphosphonate therapy; started [facility] February 2014, T score -2.7 and -3., s/p colostomy, recent abdominal cellulits    Patient Stated Goals  to have more endurance     Currently in Pain?  Yes    Pain Score  5     Pain Location  Abdomen    Pain Orientation  Anterior    Pain Descriptors / Indicators  --   nausea    Pain Type  Chronic pain                  [facility]patient [facility]  Lymphedema Life Impact Scale Total Score  60.29 %           [facility] Adult PT Treatment/Exercise - 03/13/18 0001      Exercises   Exercises  Knee/Hip      Knee/Hip Exercises: Supine   Other Supine Knee/Hip Exercises  knee presses, glute sets, ankle pumps       Manual Therapy   Edema Management  legs are much reduced with the fullness in right calf also reduced pt feels that her ankles are still pretty tight     Manual Lymphatic Drainage (MLD)  In Supine with HOB elevated: Short neck, 5 diaphragmatic breaths, Rt axillary nodes, Rt inguino-axillary anastomosis, and then Rt LE from lateral upper thigh to dorsal foot working from proximal to distal then retracing all steps; then same to [place] - 03/06/18 1227      PT LONG TERM GOAL #1   Title  Pt will receive appropriate compression garments for management of bilateral LE, genital and abdominal swelling to decrease risk of infection    Status  Achieved      PT LONG TERM GOAL #2   Title  Pt will be independent in self MLD for long term management of lymphedema    Status  Deferred      PT LONG TERM GOAL #3   Title  Pt will report a 25% improvmement in bilateral LE, swelling to allow improved comfort    Status  Deferred      PT LONG TERM GOAL #4   Title  Pt will report a 25% improvmement in bilateral LE, swelling to allow improved  comfort    Status  Deferred      PT LONG TERM GOAL #5   Title  Pt will reports she has about 50% more endurance allowing her to do more activities at home.     Status  Deferred      PT LONG TERM GOAL #6  Title  Pt will impove # of sit to stand to 14 in 30 seconds indicating and improvment in funcitonal strength     Time  4    Period  Weeks    Status  Deferred      PT LONG TERM GOAL #7   Title  Pt and caregiver will be independent in symptom management techniques for lymphedema such as remedial exercise, elevation and manual lymph drainage    Baseline  Limited knowledge-03/06/18    Time  8    Period  Weeks    Status  New      PT LONG TERM GOAL #8   Title  Pt will be independent in a home exercise program for strength and endurance     Time  8    Period  Weeks    Status  On-going            Plan - 03/13/18 1351    Clinical Impression Statement  Pt is doing much better today after having nephrostomy tube changed.  Swelling in legs is decreased and she is able to move much better.  She is concerned about being dehydrated and will ask if she can use her feeding tube for hydration     Clinical Impairments Affecting Rehab Potential  1 kidney functioning, metastatic disease, osteoporosis, genital, abdominal and bilateral LE swelling    PT Frequency  2x / week    PT Treatment/Interventions  ADLs/Self Care Home Management;Therapeutic activities;Therapeutic exercise;Patient/family education;Manual lymph drainage;Manual techniques;Compression bandaging;Passive range of motion;Vasopneumatic Device;Taping;Orthotic Fit/Training    PT Next Visit Plan  continue with MLD for comfort        Patient will benefit from skilled therapeutic intervention in order to improve the following deficits and impairments:  Increased edema, Difficulty walking, Decreased knowledge of precautions, Decreased endurance, Decreased strength, Pain, Decreased activity tolerance, Impaired perceived functional ability,  Decreased mobility  Visit Diagnosis: Muscle weakness (generalized)  Lymphedema, not elsewhere classified  Difficulty in walking, not elsewhere classified     Problem List Patient Active Problem List   Diagnosis Date Noted  . CKD (chronic kidney disease), stage III ([place]) 01/15/2018  . Acute lower UTI 01/14/2018  . Pelvic mass 01/14/2018  . Leukopenia due to antineoplastic chemotherapy ([place]) 01/14/2018  . SIRS (systemic inflammatory response syndrome) ([place]) 01/14/2018  . Acute kidney injury superimposed on CKD ([place]) 01/13/2018  . ARF (acute renal failure) ([place]) 01/13/2018  . Acute kidney injury ([place])   . Gross hematuria   . Anemia 11/12/2017  . Malnutrition of moderate degree 11/11/2017  . Metastatic breast cancer to small intestine causing SBO s/p SB resection 11/08/2017 11/10/2017  . 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 Cachexia ([place]) 11/10/2017  . New onset left bundle branch block (LBBB) 11/09/2017  . Colostomy obstruction from metastasis s/p colectomy/ostomy revision 11/08/2017 11/08/2017  . 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 Ascites 11/08/2017  . Hypoglycemia 11/04/2017  . Hypokalemia 11/04/2017  . Hypocalcemia 11/04/2017  . Small bowel obstruction from metastasis s/p SB resection 11/08/2017 11/03/2017  . Obstructive nephropathy 10/24/2017  . 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 Hyponatremia 10/16/2017  . Protein-calorie malnutrition, severe 07/11/2017  . Breast cancer metastasized to multiple sites ([place]) 10/16/2015  . Genetic testing 06/24/2015  . Osteoporosis 12/06/2013  . Cancer of lower-inner quadrant of left female breast ([place]) 03/22/2013  . Breast cancer metastasized to bone ([place]) 10/03/2012   Sonia Hill, PT  Sonia Hill 03/13/2018, 1:53 PM  [place], [place],  [address] Phone: [phone]   Fax:  [phone]  Name: Sonia Hill MRN: [id number] Date of Birth: 26-Apr-1956

## 2018-03-14 LAB — CANCER ANTIGEN 27.29: CA 27.29: 2603.9 U/mL — ABNORMAL HIGH (ref 0.0–38.6)

## 2018-03-14 NOTE — Progress Notes (Signed)
[place]  [phone] Fax:([phone]     ID: Sonia Hill   DOB: [birth year]  MR#: [id number]  GXQ#:[id number]  Patient Care Team: [name], MD as PCP - General (Family Medicine) Javan Gonzaga, Virgie Dad, MD as Consulting Physician (Oncology) Molli Posey, MD as Consulting Physician (Obstetrics and Gynecology) Laurence Spates, MD as Consulting Physician (Gastroenterology) Leighton Ruff, MD as Consulting Physician (General Surgery) Delrae Rend, MD as Consulting Physician (Endocrinology) Clarene Essex, MD as Consulting Physician (Gastroenterology) Donato Heinz, MD as Consulting Physician (Nephrology)   CHIEF COMPLAINT: Metastatic breast cancer, estrogen receptor positive  CURRENT TREATMENT: Observation; palliative care; [refuses bisphosphonates or denosumab; also refuses chemo and radiation options]  INTERVAL HISTORY: Sonia Hill  returns today for follow-up and treatment of her 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 She is accompanied by her husband. She has been on exemestane, with good tolerance. She denies issues with hot flashes or vaginal dryness.   She also received abemaciclib, and she tolerates this well. She denies issues diarrhea or other related side effects.   Since her last visit, she completed a CT abdomen and pelvis without contrast on 02/14/2018 showing: Study significantly limited by absence of oral and IV contrast. Well-positioned right percutaneous nephrostomy tube. No right hydronephrosis. Multiple newly mildly dilated small bowel loops with air-fluid levels throughout the abdomen, without discrete small bowel caliber transition. Findings suggest mild mechanical mid to distal small bowel obstruction versus adynamic ileus. Mild retroperitoneal adenopathy is mildly increased. Left inguinal adenopathy is stable. Irregular diffuse peritoneal thickening throughout the abdomen and pelvis, slightly worsened,  compatible with peritoneal carcinomatosis. Infiltrative deep pelvic soft tissue mass and marked circumferential wall thickening in the remnant sigmoid colon and rectum are unchanged and compatible with peritoneal malignancy. Small to moderate volume ascites, slightly increased. Trace dependent bilateral pleural effusions. Scattered mixed lytic and sclerotic osseous lesions in the spine are stable. Moderate anasarca, worsened.  Aortic Atherosclerosis (ICD10-I70.0).     REVIEW OF SYSTEMS: Sonia Hill reports that she has not been doing well. She has trouble eating and retaining food or fluids. She has significant abdominal discomfort. The discomfort is a combination of pelvic, stomach, and side pain. She has frequent nausea with some vomiting. Her husband notes that letting food out of her gastrostomy tube relieves the pain, but she isn't getting much nutrition. She had a yeast and bacterial infection, and she denies having fever. She takes Zofran for nausea and ativan and liquid oxycodone for pain. The pain medications also make her stomach hurt. She denies any new headaches or visual changes. There has been no unusual cough, phlegm production, or pleurisy. She denies unexplained fatigue, bleeding, rash, or fever. A detailed review of systems was otherwise non contributory.    BREAST CANCER HISTORY:  From the initial intake note:  Sonia Hill had screening mammography on 11/10/2009 showing very dense tissue with a possible distortion in the left breast.  Digital mammography on June 10th showed a 1.5 cm cluster of microcalcifications in the lower inner portion of the breast.  On physical exam, there was no palpable mass and no palpable axillary adenopathy.  There was some nipple inversion inferolaterally.  Ultrasound showed a 2.6 cm irregular mass-like area with dense posterior acoustic shadowing in the 3 o'clock position, 1 cm from the nipple, but normal appearing left axillary lymph nodes.    The patient was  brought back for biopsy of the mass in question on June 14th and the pathology ([id number]) showed an invasive lobular breast cancer (  E-cadherin negative) apparently low grade.  A second mass noted on ultrasound was also lobular, also low grade.  Both were ER and PR positive at well over 90%.  Both had low proliferation markers at 9%, and both were Her-2 negative by CISH with ratios of 1.6 and 1.24.  In short, both masses were nearly identical.    On June 16th, the patient had bilateral breast MRIs which showed in the left breast at 3 o'clock an irregular area of abnormal enhancement measuring up to 2.7 cm and in the left lower inner quadrant, a second post-biopsy area measuring 1.5 cm.  The difference between these two areas was stated at the conference the morning of the visit to be approximately 5 cm.  Given this data the patient opted for Left mastectomy with sentinel lymph node sampling, performed July 2011 with results as detailed below.  Subsequent history is as detailed below.    PAST MEDICAL HISTORY: Past Medical History:  Diagnosis Date  . Arthritis    neck, upper back, shoulders - no meds - yoga  . Chronic kidney disease    Only has right kidney  . Depression    Hx - no current problem  . GERD (gastroesophageal reflux disease)    diet controlled - no meds  . Hyperlipidemia    diet controlled  . Hypothyroidism   . lt breast ca dx'd 11/2009  . Osteoporosis, unspecified 12/06/2013  . PONV (postoperative nausea and vomiting)   . SBO (small bowel obstruction) ([place])   . SVD (spontaneous vaginal delivery)    x 1  1. Osteopenia. 2. Congenital left renal atrophy.  3. History of recurrent UTIs. 4. History of bladder reconstruction for reflux more than 18 years ago. 5. History of depression and anxiety. 6. History of hypothyroidism.  7. History of "sling procedure".  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     FAMILY HISTORY The patient's father is alive in his early 59s.  He has Alzheimer's disease. The patient's mother was diagnosed with breast cancer at the age of 79.  She died at the age of 19.  The patient's father had one out of two sisters with breast cancer.  There are other breast cancers on the mother's side.  The patient has one brother and one younger sister, neither with cancer.    GYNECOLOGIC HISTORY:  (Reviewed 12/06/2013) She is GX P1.  That pregnancy was at age 31.  Menarche was at age 29.  She was perimenopausal at the time of diagnosis but is now convincingly postmenopausal  SOCIAL HISTORY:  (Reviewed 12/06/2013) She works as a Electrical engineer, part-time and on consultation.  Her husband, [name], is a Architect at [employer].  [name] works as a Pharmacist, hospital in [place].  The patient's son [name] is  in college.  The patient attends [employer].   ADVANCED DIRECTIVES: in place  HEALTH MAINTENANCE: (Updated 12/06/2013)  Social History   Tobacco Use  . Smoking status: Never Smoker  . Smokeless tobacco: Never Used  Substance Use Topics  . Alcohol use: No  . Drug use: No     Colonoscopy: 06/18/2015; [facility]  PAP: UTD/May 2015, Dr. Matthew Saras  Bone density: [name] 2014, osteoporosis  Lipid panel: UTD (Not on file)    Allergies  Allergen Reactions  . Sulfa Antibiotics Rash  . Adhesive [Tape] Rash    Ok to use paper tape and tegaderm over IV site  .  Betadine [Povidone Iodine] Rash  . Mercury Rash    Reaction mercurachrome    Current Outpatient Medications  Medication Sig Dispense Refill  . ALPRAZolam (XANAX) 0.25 MG tablet Take 1 tablet (0.25 mg total) by mouth every 8 (eight) hours as needed. for anxiety 30 tablet 0  . Digestive Enzymes (DIGESTIVE ENZYME PO) Take 1 tablet by mouth daily.    . furosemide (LASIX) 40 MG tablet Take 1 tablet (40 mg total) by mouth daily as needed. 30 tablet 0  . gabapentin (NEURONTIN) 300 MG capsule Take 1  capsule (300 mg total) by mouth 3 (three) times daily as needed (nerve pain). 90 capsule 4  . ondansetron (ZOFRAN-ODT) 8 MG disintegrating tablet Take 1 tablet (8 mg total) by mouth every 8 (eight) hours as needed for nausea or vomiting. 40 tablet 1  . oxyCODONE-acetaminophen (ROXICET) 5-325 MG/5ML solution Take 5 mLs by mouth every 4 (four) hours as needed for moderate pain or severe pain. 240 mL 0  . Probiotic Product (PROBIOTIC DAILY PO) Take 1 tablet by mouth daily.    . simethicone (MYLICON) 016 MG chewable tablet Chew 125 mg by mouth every 2 (two) hours as needed for flatulence.     . Thyroid (NATURE-THROID) 81.25 MG TABS Take 81.25 mg by mouth daily.     No current facility-administered medications for this visit.     OBJECTIVE: Middle-aged white woman examined in a wheelchair  Vitals:   03/15/18 1241  BP: 130/83  Pulse: 90  Resp: 18  SpO2: 100%     Body mass index is 18.91 kg/m.    ECOG FS: 2 Filed Weights   03/15/18 1241  Weight: 100 lb 1.6 oz (45.4 kg)   Sclerae unicteric, EOMs intact Oropharynx clear, slightly dry No cervical or supraclavicular adenopathy Lungs no rales or rhonchi Heart regular rate and rhythm Abd soft, nontender, positive bowel sounds; PEG tube and nephrostomy tube in place, no evidence of infection MSK no focal spinal tenderness, no upper extremity lymphedema Neuro: nonfocal, well oriented, discouraged affect Breasts: Deferred    LAB RESULTS:   No results found for: CA125 Lab Results  Component Value Date   LABCA2 181 (H) 10/23/2015  No results found for: CEA    Lab Results  Component Value Date   WBC 4.9 03/13/2018   NEUTROABS 4.3 03/13/2018   HGB 11.5 (L) 03/13/2018   HCT 33.9 (L) 03/13/2018   MCV 91.6 03/13/2018   PLT 432 (H) 03/13/2018      Chemistry      Component Value Date/Time   NA 130 (L) 03/13/2018 1158   NA 134 (L) 12/28/2016 1503   K 3.5 03/13/2018 1158   K 4.0 12/28/2016 1503   CL 84 (L) 03/13/2018 1158   CL 98  12/03/2012 1419   CO2 34 (H) 03/13/2018 1158   CO2 25 12/28/2016 1503   BUN 24 (H) 03/13/2018 1158   BUN 12.0 12/28/2016 1503   CREATININE 1.43 (H) 03/13/2018 1158   CREATININE 1.46 (H) 02/13/2018 1541   CREATININE 0.8 12/28/2016 1503      Component Value Date/Time   CALCIUM 10.0 03/13/2018 1158   CALCIUM 9.7 12/28/2016 1503   ALKPHOS 82 03/13/2018 1158   ALKPHOS 76 12/28/2016 1503   AST 47 (H) 03/13/2018 1158   AST 41 02/13/2018 1541   AST 22 12/28/2016 1503   ALT 9 03/13/2018 1158   ALT 7 02/13/2018 1541   ALT 20 12/28/2016 1503   BILITOT 0.6 03/13/2018 1158  BILITOT 0.4 02/13/2018 1541   BILITOT 0.50 12/28/2016 1503      STUDIES: Ct Abdomen Pelvis Wo Contrast  Result Date: 02/14/2018 CLINICAL DATA:  Stage IV breast cancer. Abdominal pain. Progressive left lower extremity swelling. EXAM: CT ABDOMEN AND PELVIS WITHOUT CONTRAST TECHNIQUE: Multidetector CT imaging of the abdomen and pelvis was performed following the standard protocol without IV contrast. COMPARISON:  01/13/2018 CT abdomen/pelvis. FINDINGS: Lower chest: Trace dependent bilateral pleural effusions. Subpleural 3 mm basilar right lower lobe pulmonary nodule (series 9/image 7), not appreciably changed. Hepatobiliary: Normal liver size. No liver mass. Normal gallbladder with no radiopaque cholelithiasis. No biliary ductal dilatation. Pancreas: Normal, with no mass or duct dilation. Spleen: Normal size. No mass. Adrenals/Urinary Tract: No discrete adrenal nodules. Left nephrectomy. Well-positioned right percutaneous nephrostomy tube with distal pigtail portion in the central right renal collecting system. No right hydronephrosis. No right renal stones. No contour deforming right renal mass. Bladder is nearly collapsed. Previously described infiltrative deep pelvic soft tissue mass along the posterior bladder wall measures approximately 7.8 x 4.2 cm, previously 7.8 x 4.1 cm using similar measurement technique, not appreciably  changed. Stomach/Bowel: Stomach mildly distended with air-fluid level. There are multiple newly mildly dilated small bowel loops with air-fluid levels throughout the abdomen measuring up to 4.0 cm diameter in the left abdomen (series 2/image 30). No discrete small bowel caliber transition. Status post partial distal colectomy with end colostomy in the ventral left abdominal wall. Mild-to-moderate stool in right and remnant transverse colon without acute wall thickening in this portion of the colon. Stable marked circumferential wall thickening in the remnant sigmoid colon and rectum. Vascular/Lymphatic: Atherosclerotic nonaneurysmal abdominal aorta. Poorly defined enlarged 1.6 cm left para-aortic node (series 2/image 36), previously 1.4 cm, slightly increased. Stable mildly enlarged 1.3 cm left inguinal node (series 2/image 69). Reproductive: No discrete adnexal mass. Other: No pneumoperitoneum. Small to moderate volume ascites, slightly increased. Irregular diffuse peritoneal thickening throughout the abdomen and pelvis, which appears slightly increased, for example in the right paracolic gutter measuring up to 0.5 cm thickness, previously 0.3 cm. Musculoskeletal: Stable mixed lytic and sclerotic lesions in T12, L1 and L3 vertebral bodies. No new focal osseous lesions. Mild thoracolumbar spondylosis. Moderate anasarca, worsened. IMPRESSION: 1. Study significantly limited by absence of oral and IV contrast. 2. Well-positioned right percutaneous nephrostomy tube. No right hydronephrosis. 3. Multiple newly mildly dilated small bowel loops with air-fluid levels throughout the abdomen, without discrete small bowel caliber transition. Findings suggest mild mechanical mid to distal small bowel obstruction versus adynamic ileus. 4. Mild retroperitoneal adenopathy is mildly increased. Left inguinal adenopathy is stable. 5. Irregular diffuse peritoneal thickening throughout the abdomen and pelvis, slightly worsened,  compatible with peritoneal carcinomatosis. Infiltrative deep pelvic soft tissue mass and marked circumferential wall thickening in the remnant sigmoid colon and rectum are unchanged and compatible with peritoneal malignancy. Small to moderate volume ascites, slightly increased. 6. Trace dependent bilateral pleural effusions. 7. Scattered mixed lytic and sclerotic osseous lesions in the spine are stable. 8. Moderate anasarca, worsened. 9.  Aortic Atherosclerosis (ICD10-I70.0). These results will be called to the ordering clinician or representative by the Radiology Department at the imaging location. Electronically Signed   By: Ilona Sorrel M.D.   On: 02/14/2018 14:10   Ir Gastrostomy Tube  Result Date: 02/23/2018 INDICATION: History of metastatic breast cancer now with peritoneal carcinomatosis. Request made for placement of a gastrostomy tube for palliative ventilatory purposes. Note, patient had previous surgically placed gastrostomy tube. Additionally, patient does have  a trace amount of intra-abdominal ascites, however the risks and benefits of gastrostomy tube were discussed in detail with the patient and the patient's family including the risk of ascitic leakage around the gastrostomy track, however the patient is willing to proceed as she is severely debilitated by her chronic nausea and vomiting. EXAM: PULL TROUGH GASTROSTOMY TUBE PLACEMENT COMPARISON:  None. MEDICATIONS: Ancef 2 gm IV; Antibiotics were administered within 1 hour of the procedure. Glucagon 1 mg IV CONTRAST:  10 mL of Omnipaque 300 administered into the gastric lumen. ANESTHESIA/SEDATION: Moderate (conscious) sedation was employed during this procedure. A total of Versed 3 mg and Fentanyl 100 mcg was administered intravenously. Moderate Sedation Time: 24 minutes. The patient's level of consciousness and vital signs were monitored continuously by radiology nursing throughout the procedure under my direct supervision. FLUOROSCOPY TIME:  1  minutes 48 seconds (14 mGy) COMPLICATIONS: None immediate. PROCEDURE: Informed written consent was obtained from the patient and the patient's husband following explanation of the procedure, risks, benefits and alternatives. A time out was performed prior to the initiation of the procedure. Ultrasound scanning was performed to demarcate the edge of the left lobe of the liver. Maximal barrier sterile technique utilized including caps, mask, sterile gowns, sterile gloves, large sterile drape, hand hygiene and Betadine prep. The left upper quadrant was sterilely prepped and draped. An oral gastric catheter was inserted into the stomach under fluoroscopy. The existing nasogastric feeding tube was removed. The left costal margin and air/barium opacified transverse colon were identified and avoided. Air was injected into the stomach for insufflation and visualization under fluoroscopy. Under sterile conditions a 17 gauge trocar needle was utilized to access the stomach percutaneously beneath the left subcostal margin after the overlying soft tissues were anesthetized with 1% Lidocaine with epinephrine. Needle position was confirmed within the stomach with aspiration of air and injection of small amount of contrast. A single T tack was deployed for gastropexy. Over an Amplatz guide wire, a 9-French sheath was inserted into the stomach. A snare device was utilized to capture the oral gastric catheter. The snare device was pulled retrograde from the stomach up the esophagus and out the oropharynx. The 20-French pull-through gastrostomy was connected to the snare device and pulled antegrade through the oropharynx down the esophagus into the stomach and then through the percutaneous tract external to the patient. The gastrostomy was assembled externally. Contrast injection confirms position in the stomach. Several spot radiographic images were obtained in various obliquities for documentation. The patient tolerated procedure  well without immediate post procedural complication. Following the procedure, the gastrostomy tube was connected to low wall suction. FINDINGS: After successful fluoroscopic guided placement, the gastrostomy tube is appropriately positioned with internal disc against the ventral aspect of the gastric lumen. IMPRESSION: Successful fluoroscopic insertion of a 20-French pull-through gastrostomy tube. The gastrostomy may be used immediately for medication administration and in 24 hrs for the initiation of feeds. Electronically Signed   By: Sandi Mariscal M.D.   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 The tube now requires routine exchange. EXAM: RIGHT PERCUTANEOUS NEPHROSTOMY TUBE EXCHANGE UNDER FLUOROSCOPY COMPARISON:  None. MEDICATIONS: None ANESTHESIA/SEDATION: None CONTRAST:  10 mL Isovue-300-administered into the collecting system(s) FLUOROSCOPY TIME:  Fluoroscopy Time: 18 seconds.  1.9 mGy. COMPLICATIONS: None immediate. PROCEDURE: Informed written consent was obtained from the patient  after a thorough discussion of the procedural risks, benefits and alternatives. All questions were addressed. Maximal Sterile Barrier Technique was utilized including caps, mask, sterile gowns, sterile gloves, sterile drape, hand hygiene and skin antiseptic. A timeout was performed prior to the initiation of the procedure. The pre-existing nephrostomy tube was injected with contrast under fluoroscopy. The catheter was cut and removed over a guidewire. A new 10 French nephrostomy tube was advanced over the wire and formed. The catheter was injected with contrast and a fluoroscopic spot image obtained. The catheter was flushed with saline and connected to a new gravity drainage bag. The exit site was secured with a  Prolene retention suture and StatLock device. FINDINGS: The pre-existing nephrostomy tube was difficult to inject due to development of some debris and urinary crystals within the catheter lumen. After exchange, the new nephrostomy tube was formed in the renal pelvis. There is excellent return of urine after placement of the new catheter. IMPRESSION: Exchange of indwelling right 10 French percutaneous nephrostomy tube. Electronically Signed   By: Aletta Edouard M.D.   On: 03/09/2018 14:37     ASSESSMENT: 62 y.o.  BRCA 1-2 negative [facility] woman with lobular breast cancer stage IV at presentation July 2011  (1) status post left mastectomy and sentinel lymph node sampling in July 2011 for a lower inner quadrant T1 N1 M1, stage IV invasive lobular breast cancer, grade 1, strongly estrogen and progesterone receptor-positive, HER2 negative with MIB-1 of 9% and no HER2 amplification,   (2) with multiple sclerotic bone lesions at presentation seen only on CT scan (not on bone scan or PET scan), but  with biopsy-proven metastatic disease to bone; and an elevated CA 27.29 at presentation,   (3) Oncotype recurrence score of 4, predicts a good response to antiestrogens.  (4) Systemic treatment has consisted of  a) tamoxifen with evidence of response but poor tolerance  b) letrozole starting August 2012, discontinued October 2014 per patient   (5) single functioning kidney  (6) status post bilateral salpingo-oophorectomy 01/24/2013, with benign pathology  (7) osteoporosis; the patient refuses bisphosphonate therapy; started [facility] February 2014.    (a) bone density was obtained under the care of Dr. Matthew Saras at [facility], 06/25/2012, showing osteoporosis with a T score -2.6. This was repeated 12/20/2013, showing again osteoporosis with T-scores between -2.4 and -2.8.  (b) the patient refuses zolendronate, denosumab, or other pharmacologic intervention  (c) bone density on  12/30/2015 under Dr. Matthew Saras showing osteoporosis with T scores between -2.7 and -3.3  (8) the patient refuses standard Mammography or tomography; undergoing thermography screening of the right breast.  (9) PET scan 06/05/2015 shows rectal thickening and a presacral mass; biopsy of this area 10/09/2015 confirms metastatic lobular breast cancer, again estrogen receptor positive, HER-2 nonamplified.  (a) pelvic MRI 03/11/2016 confirms stability of disease  (b) chest CT and pelvic MRI 09/02/2016 shows no change in the circumferential rectal thickening or evidence of extension beyond the serosa and chest CT findings  (c) colonoscopy 02/23/2017 showed a very narrow rectal lumen with a few apparently uninvolved centimeters distally   (d) CT of the abdomen and pelvis 05/22/2017 (after diverting colostomy) shows interval increase in the size of the perirectal soft tissue masses.  (10) started fulvestrant and palbociclib 125 mg/ day [21/7] May 2017  (a) palbociclib dose decreased to 100 mg daily [21/7] with second cycle, started 11/25/2015  (b) palbociclib discontinued 03/20/2017 with evidence of disease progression  (c) last fulvestrant dose 03/24/2017,  discontinued with evidence of progression  (11) status [facility] November 2018  (12) started exemestane 07/05/2017,  (a) everolimus 5 mg/d started 07/14/2017--held as of 08/20/2017 secondary to rash  (b) exemestane stopped by patient 09/04/2017 to try "the natural way".  13) exemestane resumed 10/16/2017             (a) abemaciclib/Verzenio added 11/29/2017  (b) exemestane and abemaciclib discontinued 03/15/2018 with evidence of progression  (14) s/p laparoscopic small bowel resection for SBO 11/08/2017             (a) peritoneal fluid 11/17/2017 growing enterococcus, vanc sensitive  (b) completed antibiotics 11/27/2017    PLAN:  Sonia Hill is continuing to decline.  Even though her weight is stable, her  albumin is not bad, and she continues to have good liver and kidney function, there has been a marked decrease in her functional status.  The reason I think is because her very slow growing cancer continues to progress in the abdomen and pelvis area.  This note only makes her tired, but it is causing her significant pain.  She does not tolerate pain medicine well and today we did discuss several options including the use of liquid OxyIR which she can receive through the tube.  At this point I think it would be appropriate to stop active treatment, since it is not working.  Note that she has refused chemotherapy, which would be an option otherwise.  That refusal is not unreasonable since chemotherapy works primarily on rapidly growing tumors and hers is the opposite.  Accordingly we are stopping active treatment today.  We are switching to supportive/palliative care.  She has received already at least one visit from in-home palliative care and now we are referring her to "full hospice".  I have simplified her medications as much as possible.  I am entering an order for a liquid OxyIR to help with her pain.  I have written for them to make some rehydration solution and suggest that she receive at least 1-1/2 quarts a day which hopefully will improve her functional status some.  I have made her a return appointment for mid January but I will be glad to see her before that date if necessary.   Sonia Hill is very clear that she would not want resuscitation in case of a terminal event. Virgie Dad Sonia Mcleroy MD  03/15/18 5:06 PM Medical Oncology and Hematology [facility] [address] [place], [place] [address] Tel. [phone]    Fax. [phone]   Byran Bilotti, Virgie Dad, MD  03/15/18 5:06 PM Medical Oncology and Hematology [facility] [address] [place], [place] [address] Tel. [phone]    Fax. [phone]  Alice Rieger, am acting as scribe for Chauncey Cruel MD.  I, Lurline Del MD, have reviewed the above documentation for accuracy and completeness, and I agree with the above.

## 2018-03-15 ENCOUNTER — Telehealth: Payer: Self-pay | Admitting: Oncology

## 2018-03-15 ENCOUNTER — Ambulatory Visit: Payer: 59 | Admitting: Physical Therapy

## 2018-03-15 ENCOUNTER — Other Ambulatory Visit: Payer: Self-pay | Admitting: *Deleted

## 2018-03-15 ENCOUNTER — Inpatient Hospital Stay (HOSPITAL_BASED_OUTPATIENT_CLINIC_OR_DEPARTMENT_OTHER): Payer: 59 | Admitting: Oncology

## 2018-03-15 VITALS — BP 130/83 | HR 90 | Resp 18 | Ht 61.0 in | Wt 100.1 lb

## 2018-03-15 DIAGNOSIS — M6281 Muscle weakness (generalized): Secondary | ICD-10-CM

## 2018-03-15 DIAGNOSIS — I700 Atherosclerosis of aorta: Secondary | ICD-10-CM

## 2018-03-15 DIAGNOSIS — Z170 Estrogen receptor positive status [ER+]: Secondary | ICD-10-CM | POA: Diagnosis not present

## 2018-03-15 DIAGNOSIS — M810 Age-related osteoporosis without current pathological fracture: Secondary | ICD-10-CM

## 2018-03-15 DIAGNOSIS — C801 Malignant (primary) neoplasm, unspecified: Secondary | ICD-10-CM

## 2018-03-15 DIAGNOSIS — Z9049 Acquired absence of other specified parts of digestive tract: Secondary | ICD-10-CM

## 2018-03-15 DIAGNOSIS — Z905 Acquired absence of kidney: Secondary | ICD-10-CM

## 2018-03-15 DIAGNOSIS — Z933 Colostomy status: Secondary | ICD-10-CM

## 2018-03-15 DIAGNOSIS — R911 Solitary pulmonary nodule: Secondary | ICD-10-CM

## 2018-03-15 DIAGNOSIS — Z9012 Acquired absence of left breast and nipple: Secondary | ICD-10-CM

## 2018-03-15 DIAGNOSIS — C50911 Malignant neoplasm of unspecified site of right female breast: Secondary | ICD-10-CM

## 2018-03-15 DIAGNOSIS — R110 Nausea: Secondary | ICD-10-CM

## 2018-03-15 DIAGNOSIS — M129 Arthropathy, unspecified: Secondary | ICD-10-CM

## 2018-03-15 DIAGNOSIS — I890 Lymphedema, not elsewhere classified: Secondary | ICD-10-CM

## 2018-03-15 DIAGNOSIS — Z803 Family history of malignant neoplasm of breast: Secondary | ICD-10-CM

## 2018-03-15 DIAGNOSIS — Z87440 Personal history of urinary (tract) infections: Secondary | ICD-10-CM

## 2018-03-15 DIAGNOSIS — E785 Hyperlipidemia, unspecified: Secondary | ICD-10-CM

## 2018-03-15 DIAGNOSIS — C50312 Malignant neoplasm of lower-inner quadrant of left female breast: Secondary | ICD-10-CM

## 2018-03-15 DIAGNOSIS — C786 Secondary malignant neoplasm of retroperitoneum and peritoneum: Secondary | ICD-10-CM

## 2018-03-15 DIAGNOSIS — IMO0002 Reserved for concepts with insufficient information to code with codable children: Secondary | ICD-10-CM

## 2018-03-15 DIAGNOSIS — C7951 Secondary malignant neoplasm of bone: Secondary | ICD-10-CM

## 2018-03-15 DIAGNOSIS — J90 Pleural effusion, not elsewhere classified: Secondary | ICD-10-CM

## 2018-03-15 DIAGNOSIS — C784 Secondary malignant neoplasm of small intestine: Secondary | ICD-10-CM

## 2018-03-15 DIAGNOSIS — R978 Other abnormal tumor markers: Secondary | ICD-10-CM

## 2018-03-15 DIAGNOSIS — C50011 Malignant neoplasm of nipple and areola, right female breast: Secondary | ICD-10-CM

## 2018-03-15 DIAGNOSIS — R262 Difficulty in walking, not elsewhere classified: Secondary | ICD-10-CM

## 2018-03-15 DIAGNOSIS — Q600 Renal agenesis, unilateral: Secondary | ICD-10-CM

## 2018-03-15 DIAGNOSIS — F418 Other specified anxiety disorders: Secondary | ICD-10-CM

## 2018-03-15 DIAGNOSIS — R600 Localized edema: Secondary | ICD-10-CM

## 2018-03-15 DIAGNOSIS — Z79811 Long term (current) use of aromatase inhibitors: Secondary | ICD-10-CM

## 2018-03-15 DIAGNOSIS — C785 Secondary malignant neoplasm of large intestine and rectum: Secondary | ICD-10-CM

## 2018-03-15 DIAGNOSIS — R630 Anorexia: Secondary | ICD-10-CM

## 2018-03-15 DIAGNOSIS — C50012 Malignant neoplasm of nipple and areola, left female breast: Principal | ICD-10-CM

## 2018-03-15 DIAGNOSIS — K219 Gastro-esophageal reflux disease without esophagitis: Secondary | ICD-10-CM

## 2018-03-15 DIAGNOSIS — E039 Hypothyroidism, unspecified: Secondary | ICD-10-CM

## 2018-03-15 DIAGNOSIS — R188 Other ascites: Secondary | ICD-10-CM

## 2018-03-15 DIAGNOSIS — C50919 Malignant neoplasm of unspecified site of unspecified female breast: Secondary | ICD-10-CM

## 2018-03-15 DIAGNOSIS — Z79899 Other long term (current) drug therapy: Secondary | ICD-10-CM

## 2018-03-15 DIAGNOSIS — F329 Major depressive disorder, single episode, unspecified: Secondary | ICD-10-CM

## 2018-03-15 MED ORDER — ALPRAZOLAM 0.25 MG PO TABS: 0.2500 mg | ORAL_TABLET | Freq: Three times a day (TID) | ORAL | 0 refills | Status: AC | PRN

## 2018-03-15 MED ORDER — GABAPENTIN 300 MG PO CAPS: 300.0000 mg | ORAL_CAPSULE | Freq: Three times a day (TID) | ORAL | 4 refills | Status: AC | PRN

## 2018-03-15 MED ORDER — ONDANSETRON 8 MG PO TBDP: 8.0000 mg | ORAL_TABLET | Freq: Three times a day (TID) | ORAL | 1 refills | Status: AC | PRN

## 2018-03-15 MED ORDER — FUROSEMIDE 40 MG PO TABS: 40.0000 mg | ORAL_TABLET | Freq: Every day | ORAL | 0 refills | Status: AC | PRN

## 2018-03-15 MED ORDER — OXYCODONE-ACETAMINOPHEN 5-325 MG/5ML PO SOLN: 5.0000 mL | ORAL | 0 refills | Status: DC | PRN

## 2018-03-15 MED ORDER — OXYCODONE HCL 5 MG/5ML PO SOLN: 5.0000 mg | ORAL | 0 refills | Status: AC | PRN

## 2018-03-15 MED ORDER — OXYCODONE HCL 5 MG/5ML PO SOLN: 5.0000 mg | ORAL | Status: DC | PRN

## 2018-03-15 NOTE — Therapy (Addendum)
[place], [place], [address] Phone: [phone]   Fax:  [phone]  Physical Therapy Treatment  Patient Details  Name: Sonia Hill MRN: [id number] Date of Birth: February 02, 1956 Referring Provider (PT): Dr. [name]   Encounter Date: 03/15/2018  PT End of Session - 03/15/18 1146    Visit Number  15    Number of Visits  33    Date for PT Re-Evaluation  05/01/18    PT Start Time  1105    PT Stop Time  1146    PT Time Calculation (min)  41 min    Behavior During Therapy  [facility] for tasks assessed/performed       Past Medical History:  Diagnosis Date  . Arthritis    neck, upper back, shoulders - no meds - yoga  . Chronic kidney disease    Only has right kidney  . Depression    Hx - no current problem  . GERD (gastroesophageal reflux disease)    diet controlled - no meds  . Hyperlipidemia    diet controlled  . Hypothyroidism   . lt breast ca dx'd 11/2009  . Osteoporosis, unspecified 12/06/2013  . PONV (postoperative nausea and vomiting)   . SBO (small bowel obstruction) ([place])   . SVD (spontaneous vaginal delivery)    x 1    Past Surgical History:  Procedure Laterality Date  . ABDOMINAL SURGERY    . BLADDER NECK RECONSTRUCTION     made a new urethea tube  . bladder tact  2008  . BOWEL RESECTION N/A 11/08/2017   Procedure: SMALL BOWEL RESECTION;  Surgeon: Michael Boston, MD;  Location: [facility];  Service: General;  Laterality: N/A;  . BREAST SURGERY     masectomy left breast  . coloscopy    . COLOSTOMY    . DILATION AND CURETTAGE OF UTERUS    . GASTROSTOMY N/A 11/08/2017   Procedure: INSERTION OF GASTROSTOMY TUBE;  Surgeon: Michael Boston, MD;  Location: [facility];  Service: General;  Laterality: N/A;  . IR GASTROSTOMY TUBE MOD SED  02/23/2018  . IR NEPHROSTOGRAM RIGHT THRU EXISTING ACCESS  02/08/2018  . IR NEPHROSTOMY EXCHANGE RIGHT  03/09/2018  . IR NEPHROSTOMY PLACEMENT RIGHT  01/14/2018  .  LAPAROSCOPY N/A 01/24/2013   Procedure: LAPROSCOPY OPERATIVE;  Surgeon: Margarette Asal, MD;  Location: [facility];  Service: Gynecology;  Laterality: N/A;  . LAPAROSCOPY N/A 11/08/2017   Procedure: LAPAROSCOPY DIAGNOSTIC;  Surgeon: Michael Boston, MD;  Location: [facility];  Service: General;  Laterality: N/A;  . LYSIS OF ADHESION N/A 11/08/2017   Procedure: LYSIS OF ADHESION;  Surgeon: Michael Boston, MD;  Location: [facility];  Service: General;  Laterality: N/A;  . NEPHRECTOMY Left    left kidney dysfunction, recurrent pyelonephritis  . PARTIAL COLECTOMY N/A 11/08/2017   Procedure: PARTIAL COLECTOMY WITH NEW COLOSTOMY;  Surgeon: Michael Boston, MD;  Location: [facility];  Service: General;  Laterality: N/A;  . SALPINGOOPHORECTOMY Bilateral 01/24/2013   Procedure: SALPINGO OOPHORECTOMY;  Surgeon: Margarette Asal, MD;  Location: [facility];  Service: Gynecology;  Laterality: Bilateral;  . WISDOM TOOTH EXTRACTION      There were no vitals filed for this visit.         LYMPHEDEMA/ONCOLOGY QUESTIONNAIRE - 03/15/18 1105      Right Lower Extremity Lymphedema   20 cm Proximal to Floor at Lateral Plantar Foot  27._0 cm Proximal to Floor at Lateral Malleoli  21.5 cm    5 cm Proximal to 1st MTP Joint  20.5 cm      Left Lower Extremity Lymphedema   20 cm Proximal to Floor at Lateral Plantar Foot  28 cm    10 cm Proximal to Floor at Lateral Malleoli  21.5 cm    5 cm Proximal to 1st MTP Joint  19.5 cm           Outpatient Re[facility]03/[facility]  Lymphedema Life Impact Scale Total Score  60.29 %           [facility] Adult PT Treatment/Exercise - 03/15/18 0001      Manual Therapy   Manual therapy comments  Remeasured lower legs     Edema Management  legs appear to have more "boggy" appearance with pitting edema evident     Manual Lymphatic Drainage (MLD)  In Supine with HOB elevated: Short neck, 5 diaphragmatic breaths, Rt axillary nodes, Rt  inguino-axillary anastomosis, and then Rt LE from lateral upper thigh to dorsal foot working from proximal to distal then retracing all steps; then same to [place] - 03/06/18 1227      PT LONG TERM GOAL #1   Title  Pt will receive appropriate compression garments for management of bilateral LE, genital and abdominal swelling to decrease risk of infection    Status  Achieved      PT LONG TERM GOAL #2   Title  Pt will be independent in self MLD for long term management of lymphedema    Status  Deferred      PT LONG TERM GOAL #3   Title  Pt will report a 25% improvmement in bilateral LE, swelling to allow improved comfort    Status  Deferred      PT LONG TERM GOAL #4   Title  Pt will report a 25% improvmement in bilateral LE, swelling to allow improved comfort    Status  Deferred      PT LONG TERM GOAL #5   Title  Pt will reports she has about 50% more endurance allowing her to do more activities at home.     Status  Deferred      PT LONG TERM GOAL #6   Title  Pt will impove # of sit to stand to 14 in 30 seconds indicating and improvment in funcitonal strength     Time  4    Period  Weeks    Status  Deferred      PT LONG TERM GOAL #7   Title  Pt and caregiver will be independent in symptom management techniques for lymphedema such as remedial exercise, elevation and manual lymph drainage    Baseline  Limited knowledge-03/06/18    Time  8    Period  Weeks    Status  New      PT LONG TERM GOAL #8   Title  Pt will be independent in a home exercise program for strength and endurance     Time  8    Period  Weeks    Status  On-going            Plan - 03/15/18 1146    Clinical Impression Statement  Pt is still moving better, but has had congestion of lower legs since last session.  She is going to see  Dr. [name] today     Clinical Impairments Affecting Rehab Potential  1 kidney functioning, metastatic disease, osteoporosis,  genital, abdominal and bilateral LE swelling    PT Frequency  2x / week    PT Duration  8 weeks    PT Next Visit Plan  continue with MLD for comfort        Patient will benefit from skilled therapeutic intervention in order to improve the following deficits and impairments:     Visit Diagnosis: Muscle weakness (generalized)  Lymphedema, not elsewhere classified  Difficulty in walking, not elsewhere classified     Problem List Patient Active Problem List   Diagnosis Date Noted  . CKD (chronic kidney disease), stage III ([place]) 01/15/2018  . Acute lower UTI 01/14/2018  . Pelvic mass 01/14/2018  . Leukopenia due to antineoplastic chemotherapy ([place]) 01/14/2018  . SIRS (systemic inflammatory response syndrome) ([place]) 01/14/2018  . Acute kidney injury superimposed on CKD ([place]) 01/13/2018  . ARF (acute renal failure) ([place]) 01/13/2018  . Acute kidney injury ([place])   . Gross hematuria   . Anemia 11/12/2017  . Malnutrition of moderate degree 11/11/2017  . Metastatic breast cancer to small intestine causing SBO s/p SB resection 11/08/2017 11/10/2017  . 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 Cachexia ([place]) 11/10/2017  . New onset left bundle branch block (LBBB) 11/09/2017  . Colostomy obstruction from metastasis s/p colectomy/ostomy revision 11/08/2017 11/08/2017  . 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 Ascites 11/08/2017  . Hypoglycemia 11/04/2017  . Hypokalemia 11/04/2017  . Hypocalcemia 11/04/2017  . Small bowel obstruction from metastasis s/p SB resection 11/08/2017 11/03/2017  . Obstructive nephropathy 10/24/2017  . 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 Hyponatremia 10/16/2017  . Protein-calorie malnutrition, severe 07/11/2017  . Breast cancer metastasized to multiple sites ([place]) 10/16/2015  . Genetic testing 06/24/2015  . Osteoporosis 12/06/2013  . Cancer of lower-inner quadrant  of left female breast ([place]) 03/22/2013  . Breast cancer metastasized to bone ([place]) 10/03/2012   Donato Heinz. Owens Shark PT  Norwood Levo 03/15/2018, 11:48 AM  [place], [place], [address] Phone: [phone]   Fax:  [phone]  Name: Sonia Hill MRN: [id number] Date of Birth: Jun 28, 1955  PHYSICAL THERAPY DISCHARGE SUMMARY  Visits from Start of Care: 15  Current functional level related to goals / functional outcomes: Pt is deceased     Plan: Patient agrees to discharge.  Patient goals were not met. Patient is being discharged due to not returning since the last visit.  ?????    Maudry Diego, PT 04/19/18 1:30 PM

## 2018-03-15 NOTE — Telephone Encounter (Signed)
Gave avs and calendar ° °

## 2018-03-20 ENCOUNTER — Telehealth: Payer: Self-pay | Admitting: *Deleted

## 2018-03-20 MED ORDER — SERTRALINE HCL 50 MG PO TABS: 50.0000 mg | ORAL_TABLET | Freq: Every day | ORAL | 3 refills | Status: AC

## 2018-03-20 MED ORDER — PROCHLORPERAZINE MALEATE 5 MG PO TABS: ORAL_TABLET | ORAL | 3 refills | Status: AC

## 2018-03-20 NOTE — Telephone Encounter (Addendum)
VM received from [facility] with [place] stating per visit current request :  Sonia Hill states she feels " depressed " and she would like to take zoloft ( used previously ).  zofran ODT not helping with nausea - request use of compazine.  Pt desires to be a DNR- will bring form for MD signiture.  Return call given as [phone].  Per MD review - above appropriate - called to [facility] - obtained confidential identified VM - message left per above and prescriptions sent to pt's pharmacy.

## 2018-03-20 NOTE — Telephone Encounter (Signed)
See other entry 

## 2018-03-26 ENCOUNTER — Telehealth: Payer: Self-pay

## 2018-03-26 NOTE — Telephone Encounter (Signed)
Spoke with Brandon Melnick, RN for Hospice regarding pt status. Maura re ports pt with increased n/v despite decreased oral intake - Hospice routine standing order compazine 10mg  q 4 hours as needed.  Pt having difficulty sleeping - Hospice RSO for tamazepam nightly. No colostomy o/p for several days - requesting order for senna s 2 tabs daily (via feeding tube) Increased indigestion - recommend changing protonix to omeprazole 40mg  daily via feeding tube Request to increase xanax to q4hrs as needed. Asking if it Is it OK to use feeding tube for all medications? Pt also with decreased u/op, 3+ pitting edema, and cyanosis of extremities.  Per Dr [name] pt could have bowel obstruction and focus needs to be on making pt comfortable.  He suggest giving patient an enema instead of continuing to use feeding tube with no colostomy output. He also recommends having the Hospice medical director assess patient at this time as she may comfort care only. Dr Magrinat's recommendations verbalized to [facility] via phone call. Cristela Blue will update [place] tomorrow on pt's condition.

## 2018-03-28 ENCOUNTER — Ambulatory Visit: Payer: 59 | Admitting: Physical Therapy

## 2018-03-30 ENCOUNTER — Telehealth: Payer: Self-pay | Admitting: *Deleted

## 2018-03-30 NOTE — Telephone Encounter (Signed)
This RN received VM from [employer] with [place] stating pt has noted decline per visit today.  She states pt is now bed bound and minimally responsive.  " the past 2 visits Quadasia has been smiling " " today when I asked her if she was comfortable she opened her eyes, smiled and nodded yes "  Cristela Blue feels pt is in end stages with death likely to occur over the weekend.  No current needs at this time.  Return call number for Maura given as [phone].

## 2018-04-03 ENCOUNTER — Ambulatory Visit: Payer: 59 | Admitting: Physical Therapy

## 2018-04-03 ENCOUNTER — Telehealth: Payer: Self-pay | Admitting: *Deleted

## 2018-04-03 NOTE — Telephone Encounter (Signed)
Received call from Sheliah Plane RN/Hospice/[place] stating that pt died & was pronounced @ 4:29 pm.  Informed Dr [name] & HIM

## 2018-04-04 ENCOUNTER — Other Ambulatory Visit: Payer: Self-pay | Admitting: Oncology

## 2018-04-05 ENCOUNTER — Ambulatory Visit: Payer: 59 | Admitting: Physical Therapy

## 2018-04-10 ENCOUNTER — Ambulatory Visit: Payer: 59 | Admitting: Physical Therapy

## 2018-04-12 ENCOUNTER — Ambulatory Visit: Payer: 59 | Admitting: Physical Therapy

## 2018-04-13 DEATH — deceased

## 2018-04-20 ENCOUNTER — Other Ambulatory Visit (HOSPITAL_COMMUNITY): Payer: 59

## 2018-06-25 ENCOUNTER — Ambulatory Visit: Payer: 59 | Admitting: Oncology

## 2019-04-04 IMAGING — DX DG CHEST 1V PORT
1 series · 1 of 1 positions shown · non-contrast
Comparison: Chest radiograph 11/11/2004, lung bases from abdominal
CT 11/03/2017

CLINICAL DATA: Leukocytosis.

EXAM:
PORTABLE CHEST 1 VIEW

[chest ap]
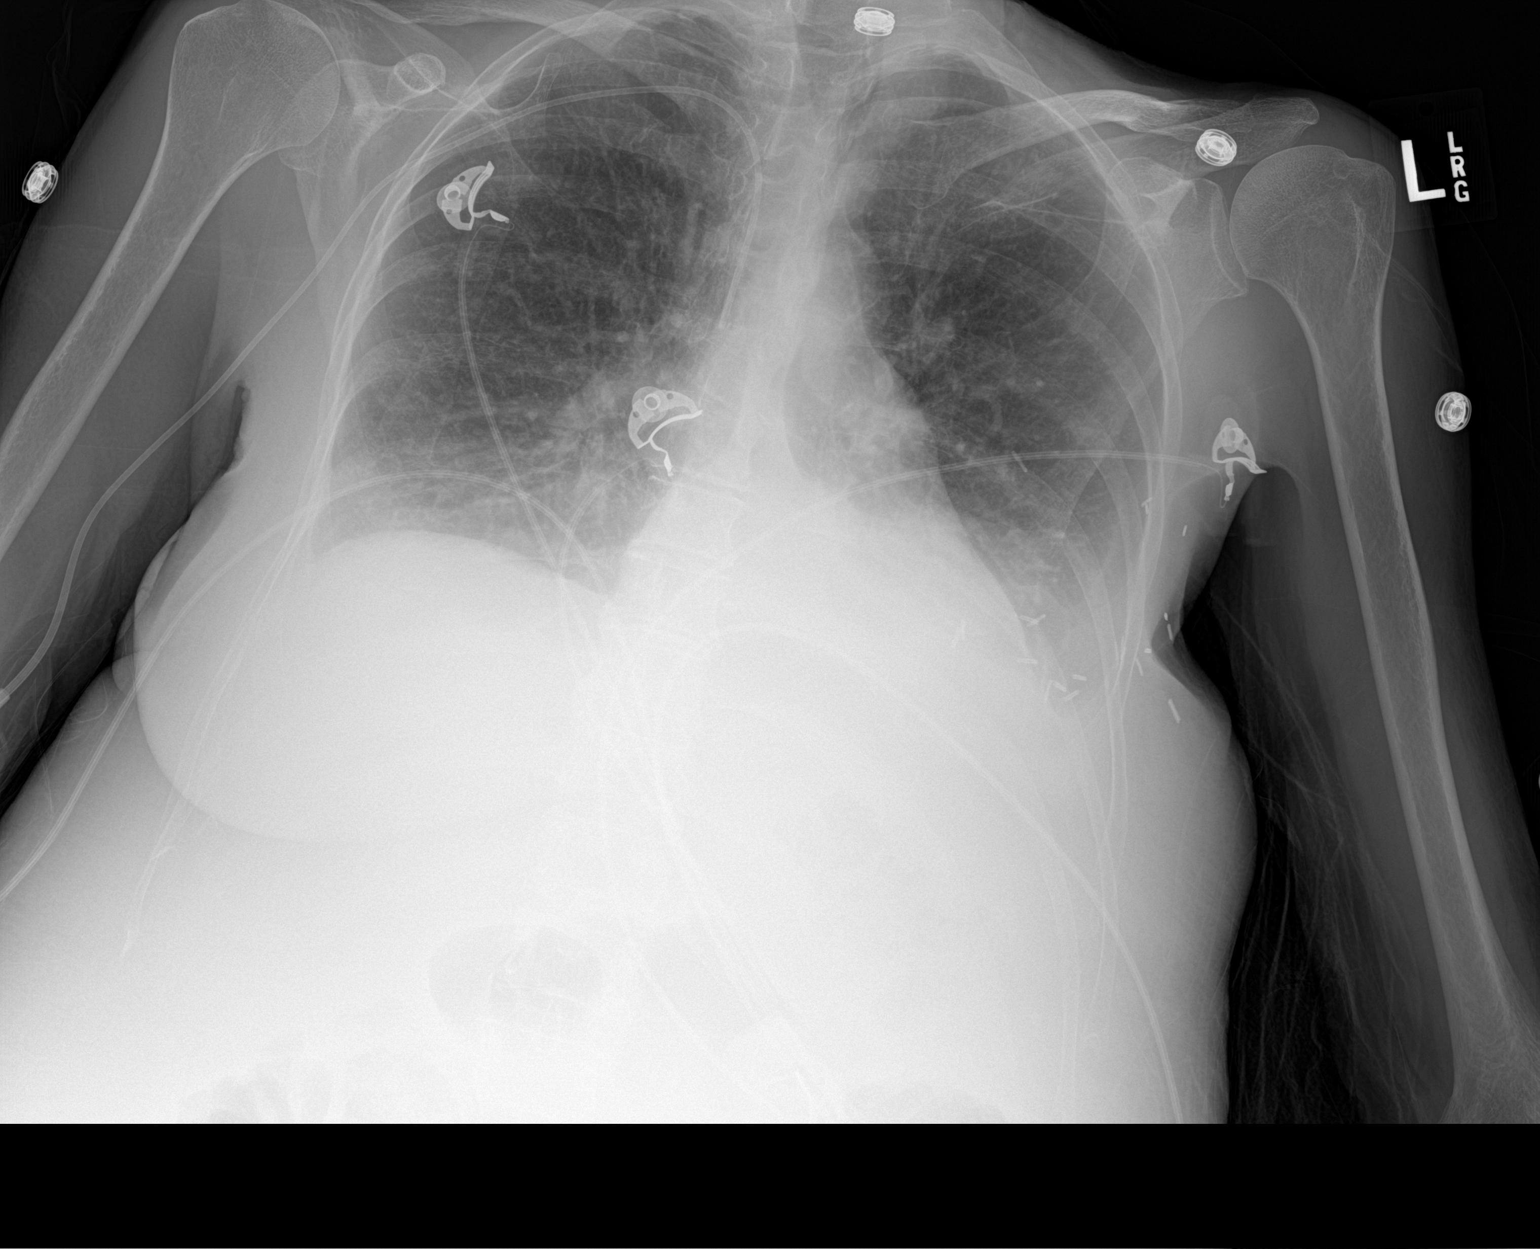

[1 of 1 positions shown; findings below may reference images not displayed]

FINDINGS: Left pleural effusion and basilar opacity, unchanged from prior
exam. Small right pleural effusion has improved from prior exam.
Mild residual right basilar atelectasis. Heart is normal in size. No
new airspace disease. Tip of the right upper extremity PICC in the
SVC. Left mastectomy with surgical clips in the chest wall.
IMPRESSION: Left pleural effusion and basilar opacity are unchanged from chest
radiograph 5 days ago. Basilar opacity likely secondary to
atelectasis, sterility technically indeterminate by radiograph.
Improved right pleural effusion and atelectasis from prior exam. No
new airspace disease.

## 2019-04-05 IMAGING — US US PARACENTESIS
1 series · 6 of 6 positions shown · non-contrast
Comparison: none

INDICATION: Patient with history of stage IV lobular breast cancer, recent small
bowel resection/transverse colostomy, ascites, bilateral pleural
effusions, rectal mass. Request made for diagnostic and therapeutic
paracentesis.

[Series 1: us paracentesis · 6 of 6 slices shown]
[im 1/6]
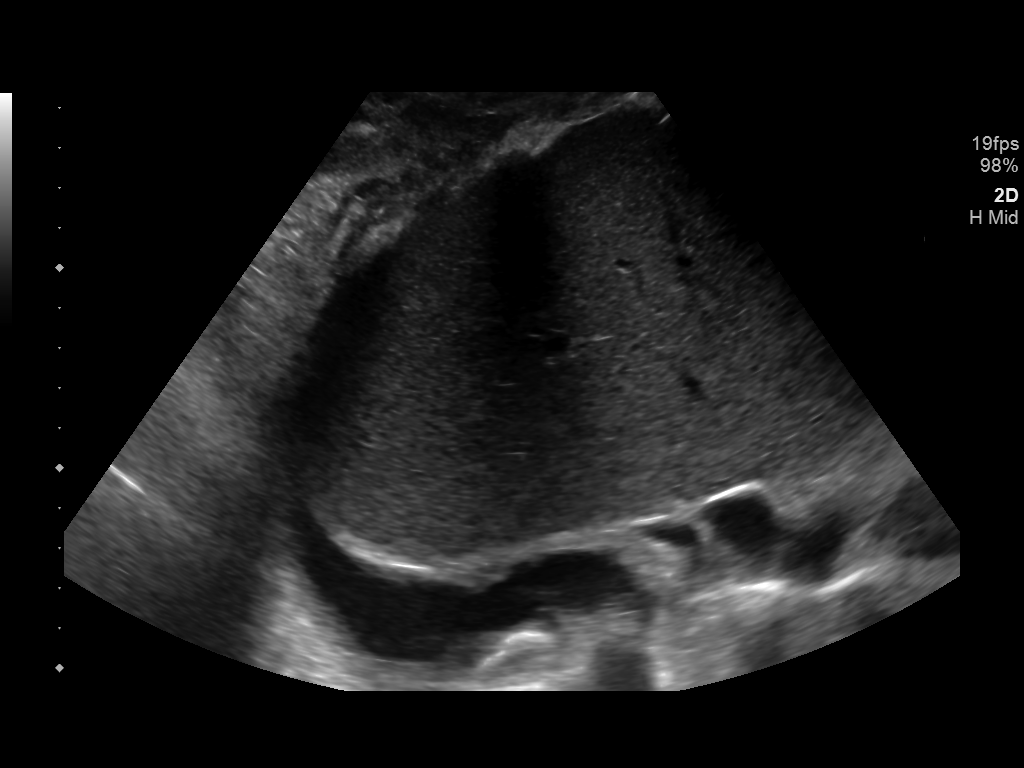
[im 2/6]
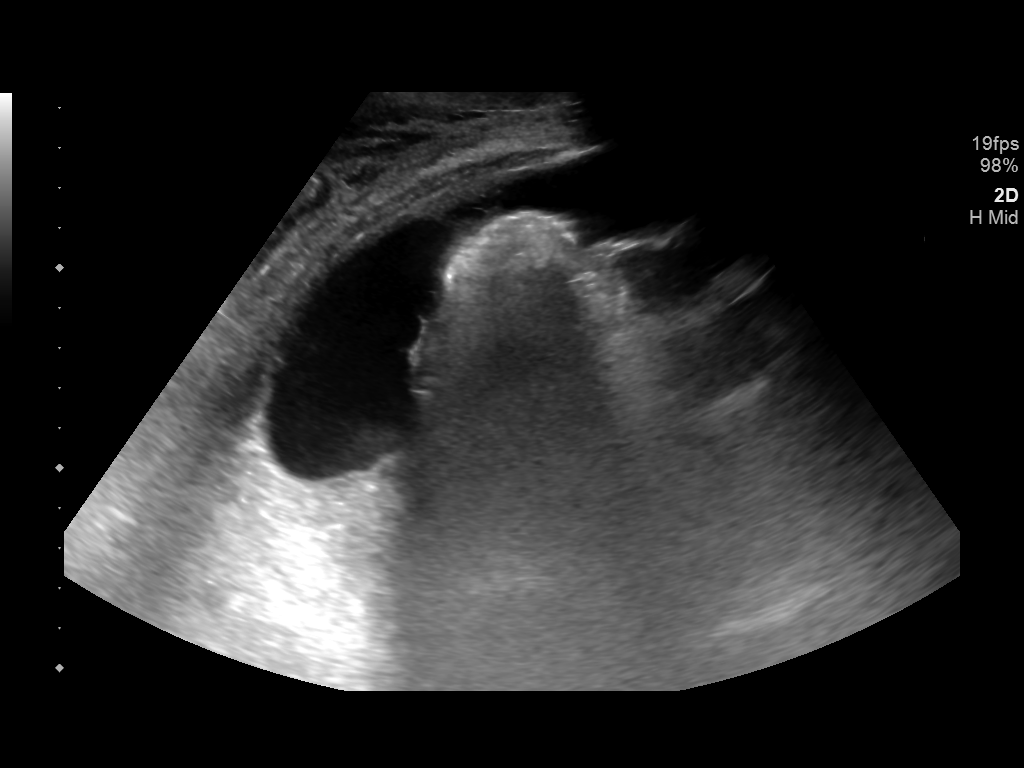
[im 3/6]
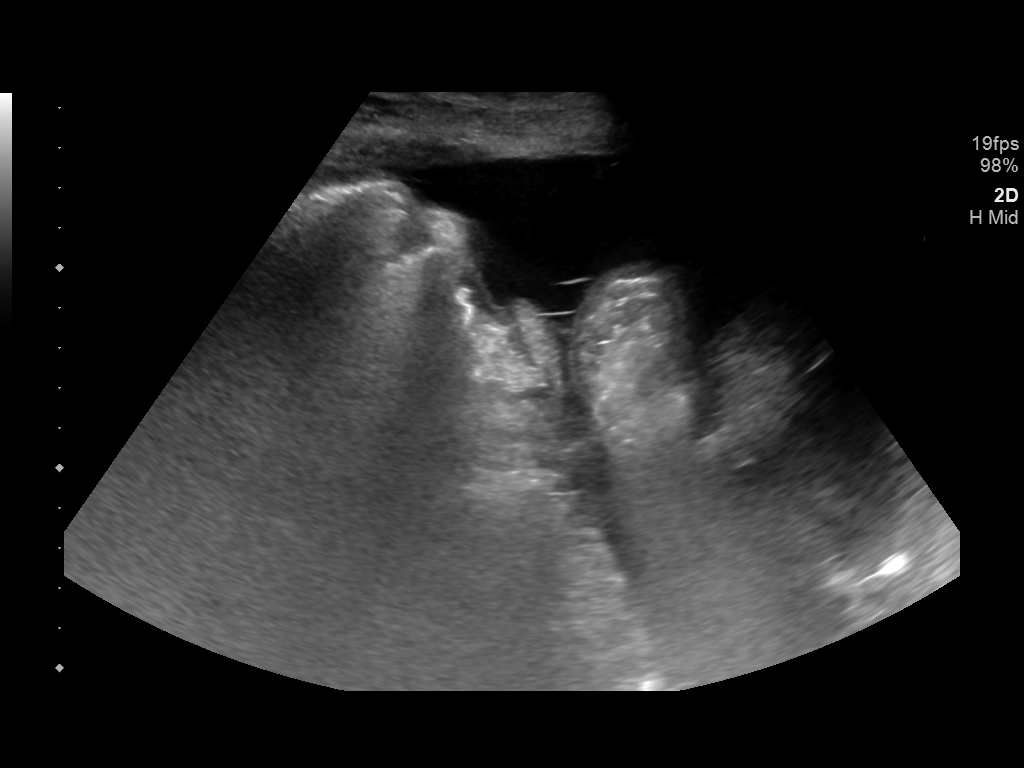
[im 4/6]
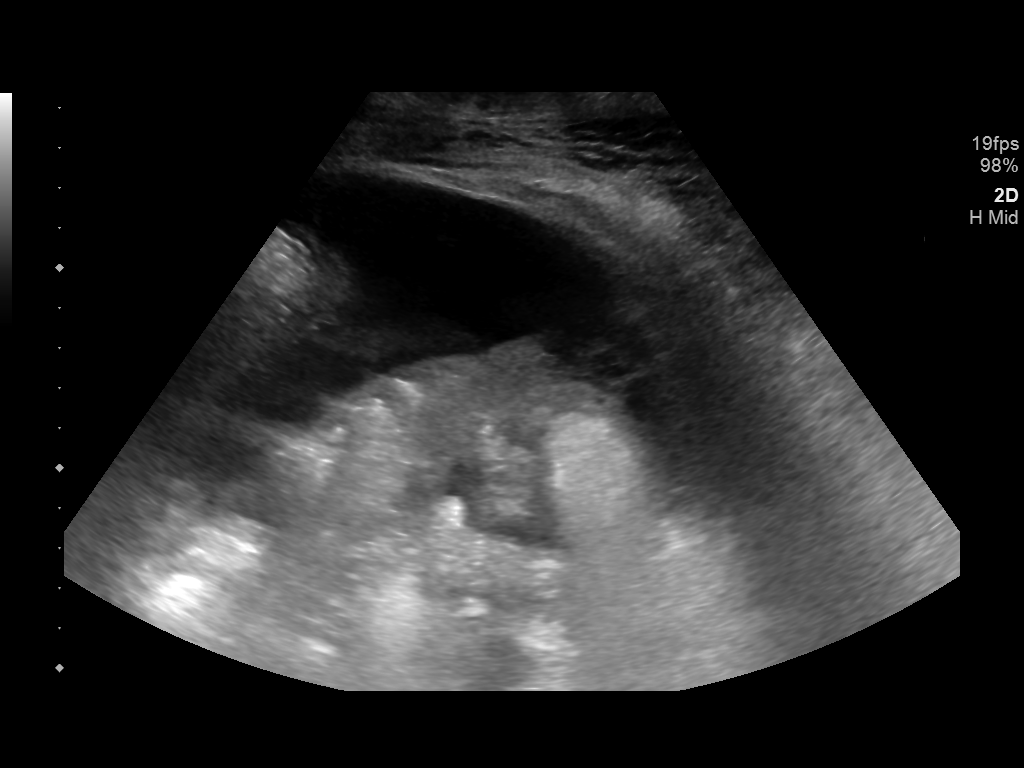
[im 5/6]
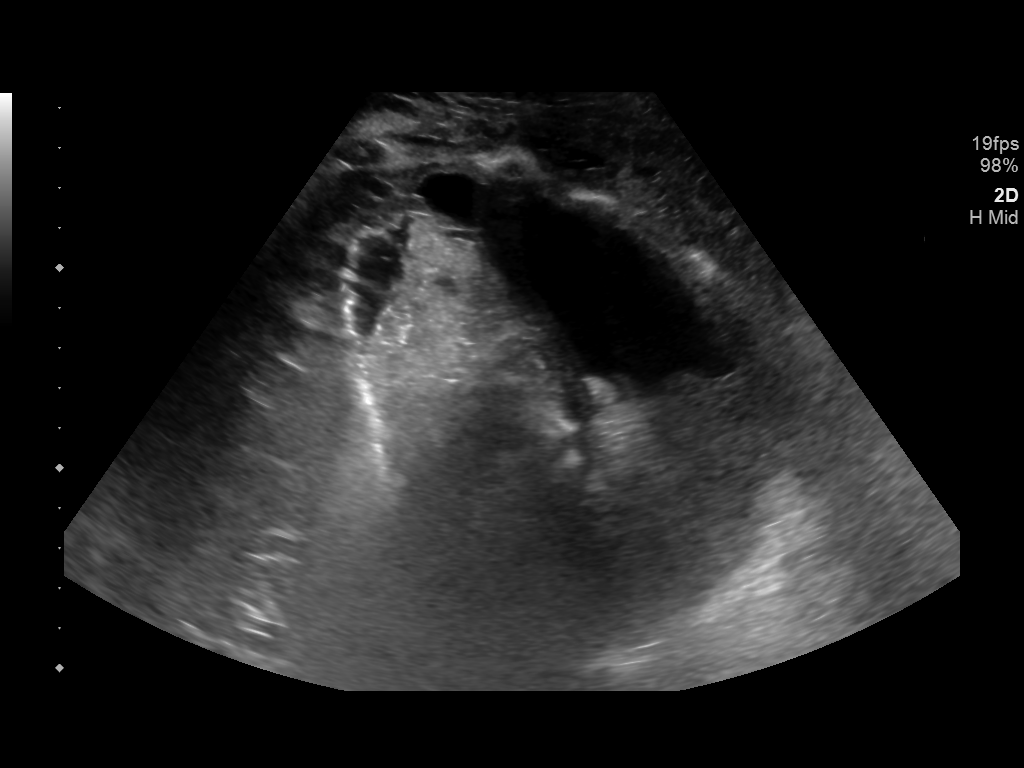
[im 6/6]
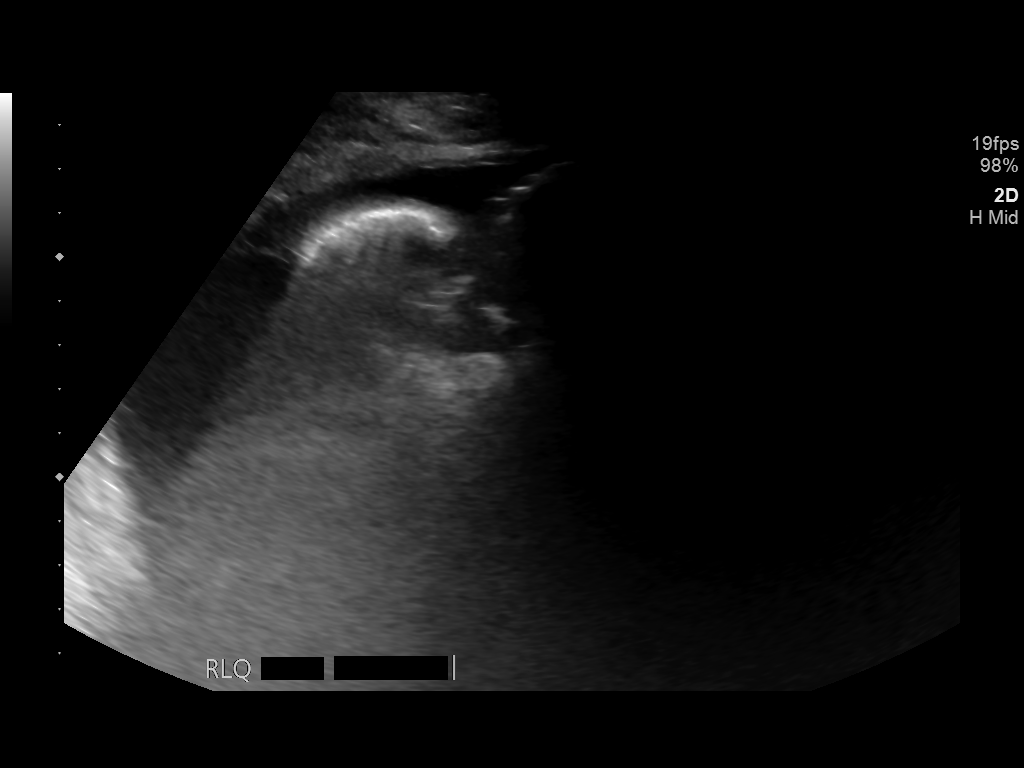

[6 of 6 positions shown; findings below may reference images not displayed]

EXAM:
ULTRASOUND GUIDED DIAGNOSTIC AND THERAPEUTIC PARACENTESIS

MEDICATIONS:
None

COMPLICATIONS:
None immediate.

PROCEDURE:
Informed written consent was obtained from the patient after a
discussion of the risks, benefits and alternatives to treatment. A
timeout was performed prior to the initiation of the procedure.

Initial ultrasound scanning demonstrates a small to moderate amount
of loculated ascites within the right lower abdominal quadrant. The
right lower abdomen was prepped and draped in the usual sterile
fashion. 1% lidocaine was used for local anesthesia.

Following this, a 6 Fr Safe-T-Centesis catheter was introduced. An
ultrasound image was saved for documentation purposes. The
paracentesis was performed. The catheter was removed and a dressing
was applied. The patient tolerated the procedure well without
immediate post procedural complication.
FINDINGS: A total of approximately 570 cc of hazy, amber fluid was removed.
Samples were sent to the laboratory as requested by the clinical
team. Due to the multiloculated nature of the ascites only the above
amount of fluid could be removed today.
IMPRESSION: Successful ultrasound-guided diagnostic and therapeutic paracentesis
yielding 570 cc of peritoneal fluid.

## 2019-04-08 IMAGING — DX DG CHEST 1V
1 series · 1 of 1 positions shown · non-contrast
Comparison: 11/16/2017, 11/20/2017

CLINICAL DATA: Right thoracentesis.

EXAM:
CHEST  1 VIEW

[chest pa]
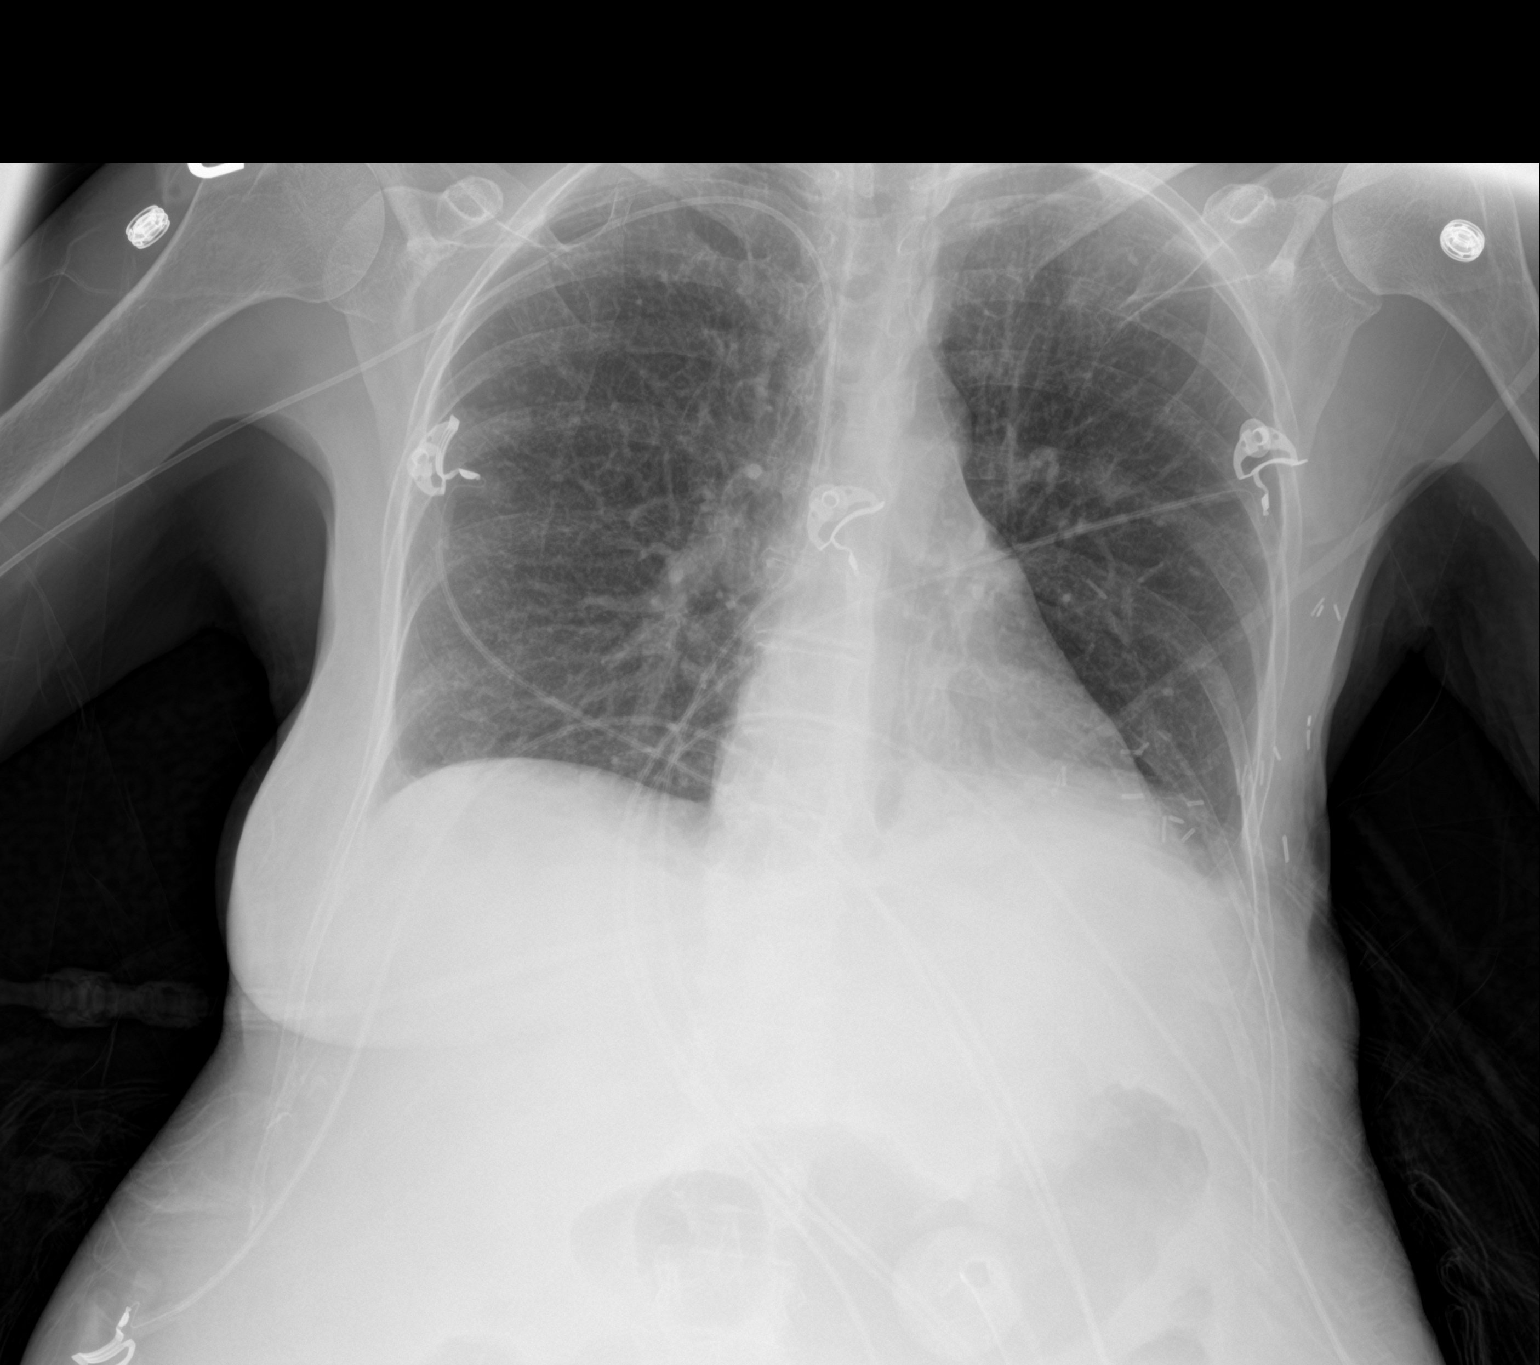

[1 of 1 positions shown; findings below may reference images not displayed]

FINDINGS: Small left pleural effusion. No significant right pleural effusion.
No pneumothorax. No focal consolidation. Stable cardiomediastinal
silhouette. Right-sided PICC line with the tip projecting over the
SVC. No acute osseous abnormality.
IMPRESSION: 1. No right pneumothorax status post thoracentesis.

## 2019-04-08 IMAGING — US US THORACENTESIS ASP PLEURAL SPACE W/IMG GUIDE
1 series · 3 of 3 positions shown · non-contrast
Comparison: none

INDICATION: Patient with history of stage IV lobular breast cancer, recent small
bowel resection/transverse colostomy, ascites, bilateral pleural
effusions, rectal mass. Request made for diagnostic and therapeutic
left thoracentesis.

[Series 1: us thoracentesis asp pleural space w/img guide · 3 of 3 slices shown]
[im 1/3]
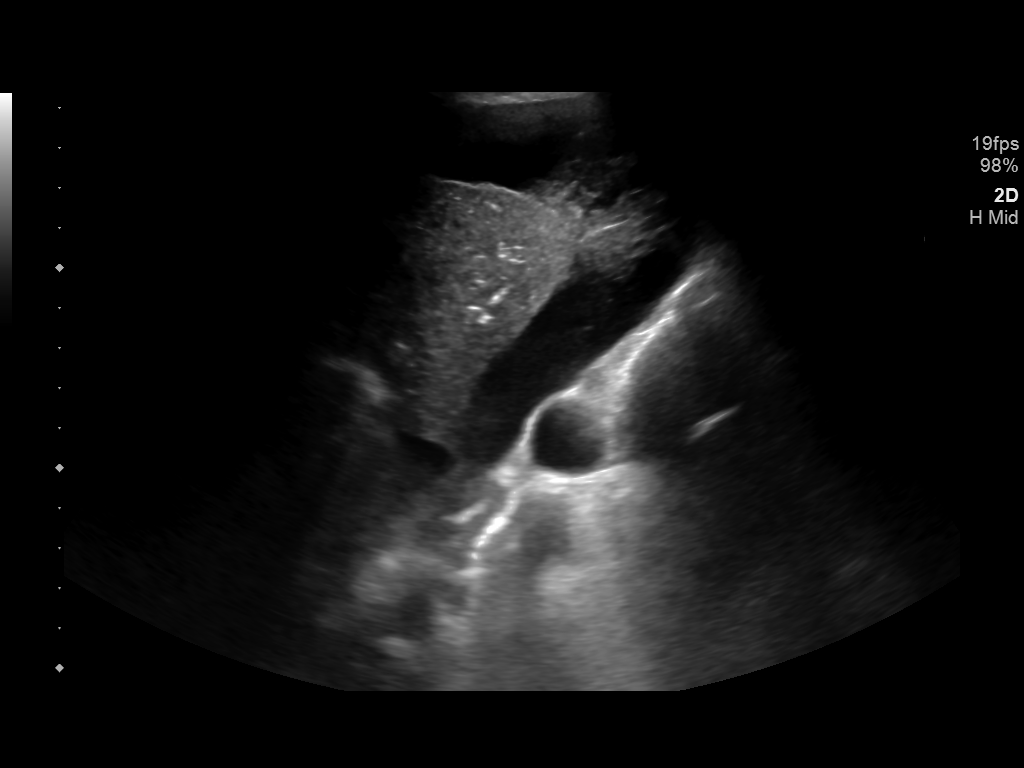
[im 2/3]
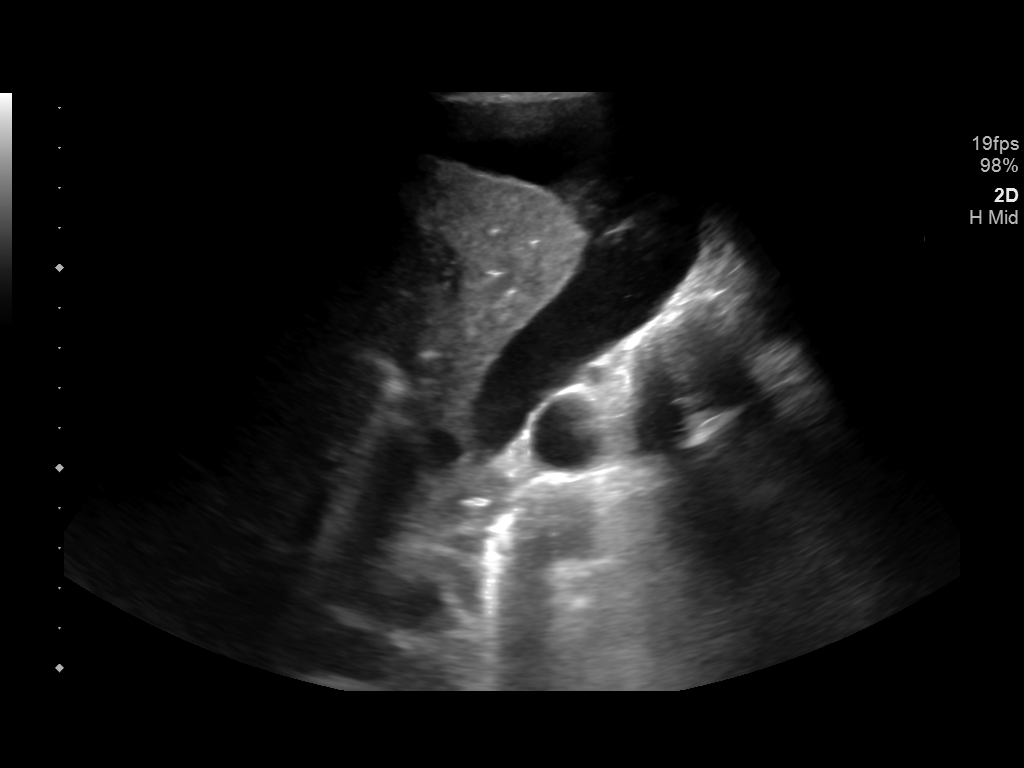
[im 3/3]
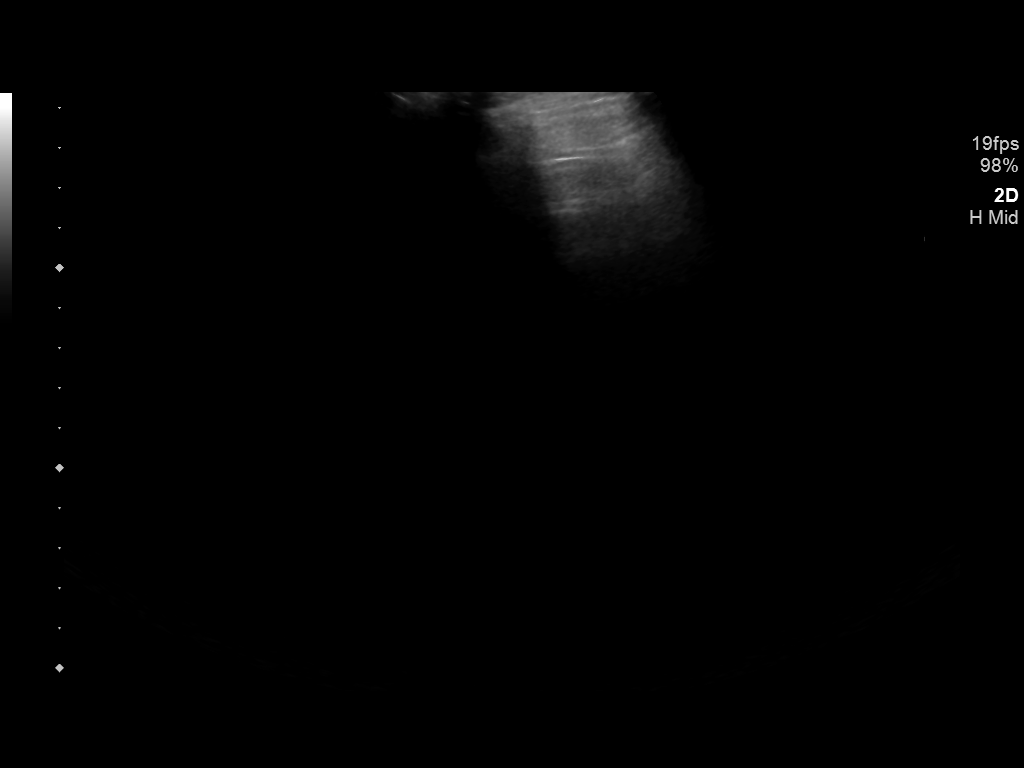

[3 of 3 positions shown; findings below may reference images not displayed]

EXAM:
ULTRASOUND GUIDED DIAGNOSTIC AND THERAPEUTIC LEFT THORACENTESIS

MEDICATIONS:
None

COMPLICATIONS:
None immediate.

PROCEDURE:
An ultrasound guided thoracentesis was thoroughly discussed with the
patient and questions answered. The benefits, risks, alternatives
and complications were also discussed. The patient understands and
wishes to proceed with the procedure. Written consent was obtained.

Ultrasound was performed to localize and mark an adequate pocket of
fluid in the left chest. The area was then prepped and draped in the
normal sterile fashion. 1% Lidocaine was used for local anesthesia.
Under ultrasound guidance a 6 Fr Safe-T-Centesis catheter was
introduced. Thoracentesis was performed. The catheter was removed
and a dressing applied.
FINDINGS: A total of approximately 170 cc of yellow fluid was removed. Samples
were sent to the laboratory as requested by the clinical team.
IMPRESSION: Successful ultrasound guided diagnostic and therapeutic left
thoracentesis yielding 170 cc of pleural fluid.

## 2019-04-08 IMAGING — DX DG CHEST 1V PORT
1 series · 1 of 1 positions shown · non-contrast
Comparison: Single-view of the chest 11/19/2017, 11/18/2017 and
11/11/2017.

CLINICAL DATA: Shortness of breath.

EXAM:
PORTABLE CHEST 1 VIEW

[chest ap]
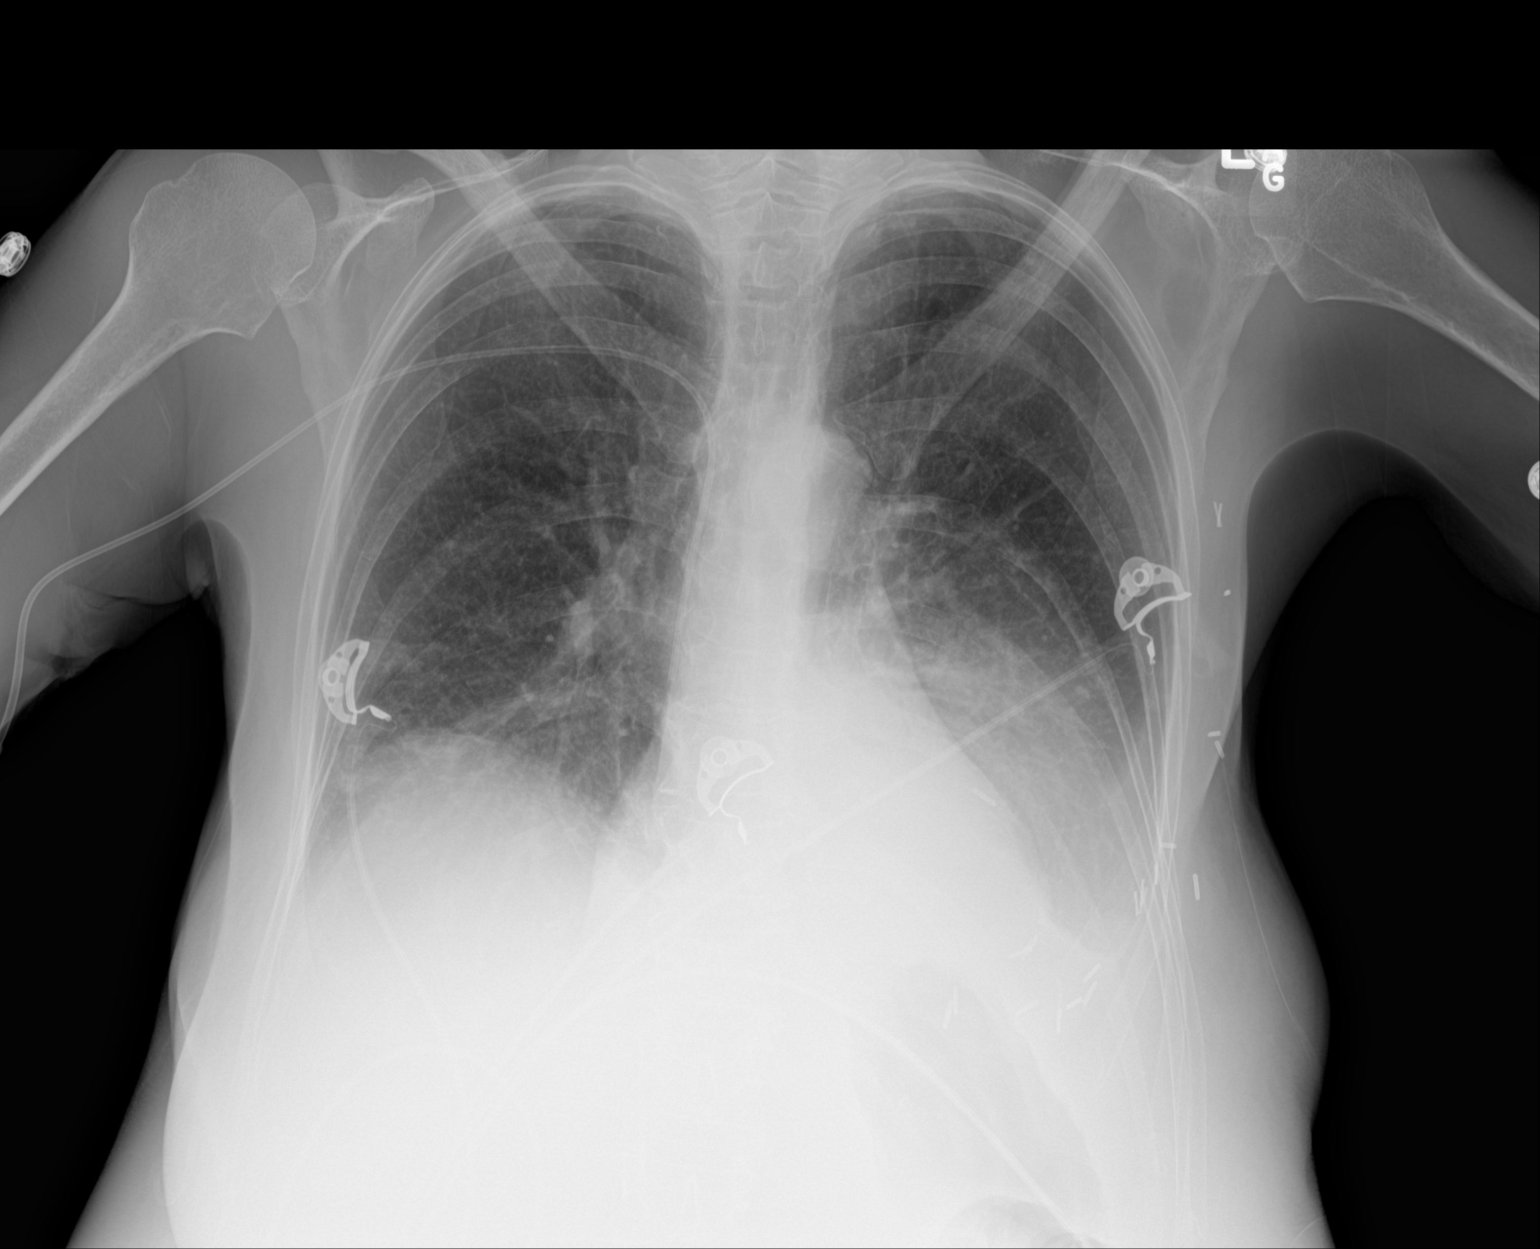

[1 of 1 positions shown; findings below may reference images not displayed]

FINDINGS: Left greater than right pleural effusions and basilar atelectasis
appear unchanged since the most recent examination. No pneumothorax.
Heart size is normal. Right PICC remains in place. Surgical clips
left axilla noted.
IMPRESSION: No change in left greater than right pleural effusions and basilar
atelectasis since the most recent study.
